# Patient Record
Sex: Female | Born: 1960
Health system: Southern US, Community
[De-identification: ages and names within clinical notes are randomized; demographics above are authoritative.]

## PROBLEM LIST (undated history)

## (undated) DIAGNOSIS — R600 Localized edema: Secondary | ICD-10-CM

## (undated) DIAGNOSIS — I10 Essential (primary) hypertension: Secondary | ICD-10-CM

## (undated) DIAGNOSIS — K760 Fatty (change of) liver, not elsewhere classified: Secondary | ICD-10-CM

## (undated) DIAGNOSIS — K219 Gastro-esophageal reflux disease without esophagitis: Secondary | ICD-10-CM

## (undated) DIAGNOSIS — M797 Fibromyalgia: Secondary | ICD-10-CM

## (undated) DIAGNOSIS — G8929 Other chronic pain: Secondary | ICD-10-CM

## (undated) DIAGNOSIS — R519 Headache, unspecified: Secondary | ICD-10-CM

## (undated) DIAGNOSIS — T7840XA Allergy, unspecified, initial encounter: Secondary | ICD-10-CM

## (undated) DIAGNOSIS — E119 Type 2 diabetes mellitus without complications: Secondary | ICD-10-CM

## (undated) DIAGNOSIS — J45909 Unspecified asthma, uncomplicated: Secondary | ICD-10-CM

## (undated) DIAGNOSIS — Z973 Presence of spectacles and contact lenses: Secondary | ICD-10-CM

## (undated) DIAGNOSIS — D219 Benign neoplasm of connective and other soft tissue, unspecified: Secondary | ICD-10-CM

## (undated) DIAGNOSIS — Z8711 Personal history of peptic ulcer disease: Secondary | ICD-10-CM

## (undated) DIAGNOSIS — M549 Dorsalgia, unspecified: Secondary | ICD-10-CM

## (undated) DIAGNOSIS — Z8719 Personal history of other diseases of the digestive system: Secondary | ICD-10-CM

## (undated) DIAGNOSIS — K589 Irritable bowel syndrome without diarrhea: Secondary | ICD-10-CM

## (undated) DIAGNOSIS — G4730 Sleep apnea, unspecified: Secondary | ICD-10-CM

## (undated) DIAGNOSIS — M1990 Unspecified osteoarthritis, unspecified site: Secondary | ICD-10-CM

## (undated) DIAGNOSIS — Z87442 Personal history of urinary calculi: Secondary | ICD-10-CM

## (undated) DIAGNOSIS — T8859XA Other complications of anesthesia, initial encounter: Secondary | ICD-10-CM

## (undated) DIAGNOSIS — D649 Anemia, unspecified: Secondary | ICD-10-CM

## (undated) DIAGNOSIS — M2550 Pain in unspecified joint: Secondary | ICD-10-CM

## (undated) HISTORY — PX: BREAST BIOPSY: SHX20

## (undated) HISTORY — PX: CHOLECYSTECTOMY: SHX55

## (undated) HISTORY — DX: Personal history of peptic ulcer disease: Z87.11

## (undated) HISTORY — DX: Allergy, unspecified, initial encounter: T78.40XA

## (undated) HISTORY — PX: UMBILICAL HERNIA REPAIR: SHX196

## (undated) HISTORY — DX: Essential (primary) hypertension: I10

## (undated) HISTORY — DX: Irritable bowel syndrome, unspecified: K58.9

## (undated) HISTORY — DX: Anemia, unspecified: D64.9

## (undated) HISTORY — PX: COLONOSCOPY: SHX174

## (undated) HISTORY — DX: Type 2 diabetes mellitus without complications: E11.9

## (undated) HISTORY — DX: Dorsalgia, unspecified: M54.9

## (undated) HISTORY — DX: Benign neoplasm of connective and other soft tissue, unspecified: D21.9

## (undated) HISTORY — PX: OVARIAN CYST SURGERY: SHX726

## (undated) HISTORY — PX: BREAST EXCISIONAL BIOPSY: SUR124

## (undated) HISTORY — DX: Localized edema: R60.0

## (undated) HISTORY — PX: UPPER GI ENDOSCOPY: SHX6162

## (undated) HISTORY — DX: Pain in unspecified joint: M25.50

## (undated) HISTORY — PX: DILATION AND CURETTAGE OF UTERUS: SHX78

---
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
## 1997-12-24 ENCOUNTER — Encounter: Admission: RE | Admit: 1997-12-24 | Discharge: 1998-03-24 | Payer: Self-pay | Admitting: Family Medicine

## 1998-01-04 ENCOUNTER — Ambulatory Visit: Admission: RE | Admit: 1998-01-04 | Discharge: 1998-01-04 | Payer: Self-pay | Admitting: Family Medicine

## 1998-01-10 ENCOUNTER — Encounter: Admission: RE | Admit: 1998-01-10 | Discharge: 1998-01-10 | Payer: Self-pay | Admitting: Infectious Diseases

## 1998-01-25 ENCOUNTER — Ambulatory Visit: Admission: RE | Admit: 1998-01-25 | Discharge: 1998-01-25 | Payer: Self-pay | Admitting: *Deleted

## 1998-02-03 ENCOUNTER — Ambulatory Visit: Admission: RE | Admit: 1998-02-03 | Discharge: 1998-02-03 | Payer: Self-pay | Admitting: *Deleted

## 1998-08-29 ENCOUNTER — Ambulatory Visit (HOSPITAL_COMMUNITY): Admission: RE | Admit: 1998-08-29 | Discharge: 1998-08-29 | Payer: Self-pay | Admitting: Family Medicine

## 1999-01-23 ENCOUNTER — Ambulatory Visit (HOSPITAL_COMMUNITY): Admission: RE | Admit: 1999-01-23 | Discharge: 1999-01-23 | Payer: Self-pay | Admitting: Gastroenterology

## 1999-02-06 ENCOUNTER — Ambulatory Visit (HOSPITAL_COMMUNITY): Admission: RE | Admit: 1999-02-06 | Discharge: 1999-02-06 | Payer: Self-pay | Admitting: Gastroenterology

## 1999-03-16 ENCOUNTER — Ambulatory Visit (HOSPITAL_COMMUNITY): Admission: RE | Admit: 1999-03-16 | Discharge: 1999-03-16 | Payer: Self-pay | Admitting: Family Medicine

## 1999-03-16 ENCOUNTER — Encounter: Payer: Self-pay | Admitting: Family Medicine

## 1999-05-08 ENCOUNTER — Encounter (INDEPENDENT_AMBULATORY_CARE_PROVIDER_SITE_OTHER): Payer: Self-pay | Admitting: Specialist

## 1999-05-08 ENCOUNTER — Ambulatory Visit (HOSPITAL_COMMUNITY): Admission: RE | Admit: 1999-05-08 | Discharge: 1999-05-08 | Payer: Self-pay | Admitting: Obstetrics and Gynecology

## 1999-05-11 ENCOUNTER — Inpatient Hospital Stay (HOSPITAL_COMMUNITY): Admission: AD | Admit: 1999-05-11 | Discharge: 1999-05-11 | Payer: Self-pay | Admitting: *Deleted

## 1999-05-12 ENCOUNTER — Encounter: Payer: Self-pay | Admitting: Obstetrics & Gynecology

## 1999-05-12 ENCOUNTER — Inpatient Hospital Stay (HOSPITAL_COMMUNITY): Admission: AD | Admit: 1999-05-12 | Discharge: 1999-05-12 | Payer: Self-pay | Admitting: Obstetrics & Gynecology

## 1999-05-18 ENCOUNTER — Encounter: Payer: Self-pay | Admitting: Family Medicine

## 1999-05-18 ENCOUNTER — Ambulatory Visit (HOSPITAL_COMMUNITY): Admission: RE | Admit: 1999-05-18 | Discharge: 1999-05-18 | Payer: Self-pay | Admitting: Family Medicine

## 1999-05-23 ENCOUNTER — Encounter: Payer: Self-pay | Admitting: Family Medicine

## 1999-05-23 ENCOUNTER — Ambulatory Visit (HOSPITAL_COMMUNITY): Admission: RE | Admit: 1999-05-23 | Discharge: 1999-05-23 | Payer: Self-pay | Admitting: Family Medicine

## 1999-09-04 ENCOUNTER — Encounter: Admission: RE | Admit: 1999-09-04 | Discharge: 1999-12-03 | Payer: Self-pay | Admitting: Family Medicine

## 2000-04-12 ENCOUNTER — Ambulatory Visit (HOSPITAL_COMMUNITY): Admission: RE | Admit: 2000-04-12 | Discharge: 2000-04-12 | Payer: Self-pay

## 2000-08-08 ENCOUNTER — Emergency Department (HOSPITAL_COMMUNITY): Admission: EM | Admit: 2000-08-08 | Discharge: 2000-08-08 | Payer: Self-pay | Admitting: *Deleted

## 2002-01-15 ENCOUNTER — Ambulatory Visit (HOSPITAL_COMMUNITY): Admission: RE | Admit: 2002-01-15 | Discharge: 2002-01-15 | Payer: Self-pay | Admitting: Family Medicine

## 2002-01-15 ENCOUNTER — Encounter: Payer: Self-pay | Admitting: Family Medicine

## 2002-10-28 ENCOUNTER — Emergency Department (HOSPITAL_COMMUNITY): Admission: EM | Admit: 2002-10-28 | Discharge: 2002-10-28 | Payer: Self-pay | Admitting: Emergency Medicine

## 2002-10-28 ENCOUNTER — Encounter: Payer: Self-pay | Admitting: Emergency Medicine

## 2002-11-03 ENCOUNTER — Ambulatory Visit (HOSPITAL_COMMUNITY): Admission: RE | Admit: 2002-11-03 | Discharge: 2002-11-03 | Payer: Self-pay | Admitting: Family Medicine

## 2002-11-03 ENCOUNTER — Encounter: Payer: Self-pay | Admitting: Family Medicine

## 2003-03-05 ENCOUNTER — Other Ambulatory Visit: Admission: RE | Admit: 2003-03-05 | Discharge: 2003-03-05 | Payer: Self-pay | Admitting: Family Medicine

## 2003-12-17 ENCOUNTER — Emergency Department (HOSPITAL_COMMUNITY): Admission: EM | Admit: 2003-12-17 | Discharge: 2003-12-17 | Payer: Self-pay

## 2004-01-25 ENCOUNTER — Ambulatory Visit (HOSPITAL_COMMUNITY): Admission: RE | Admit: 2004-01-25 | Discharge: 2004-01-25 | Payer: Self-pay | Admitting: Family Medicine

## 2004-03-09 ENCOUNTER — Other Ambulatory Visit: Admission: RE | Admit: 2004-03-09 | Discharge: 2004-03-09 | Payer: Self-pay | Admitting: Family Medicine

## 2005-03-12 ENCOUNTER — Other Ambulatory Visit: Admission: RE | Admit: 2005-03-12 | Discharge: 2005-03-12 | Payer: Self-pay | Admitting: Family Medicine

## 2005-09-03 ENCOUNTER — Encounter: Admission: RE | Admit: 2005-09-03 | Discharge: 2005-09-03 | Payer: Self-pay | Admitting: Family Medicine

## 2005-12-19 ENCOUNTER — Encounter: Payer: Self-pay | Admitting: Family Medicine

## 2008-08-20 HISTORY — PX: LUMBAR LAMINECTOMY: SHX95

## 2008-09-08 ENCOUNTER — Encounter: Admission: RE | Admit: 2008-09-08 | Discharge: 2008-09-08 | Payer: Self-pay | Admitting: Family Medicine

## 2008-10-08 ENCOUNTER — Inpatient Hospital Stay (HOSPITAL_COMMUNITY): Admission: EM | Admit: 2008-10-08 | Discharge: 2008-10-16 | Payer: Self-pay | Admitting: Emergency Medicine

## 2008-10-09 ENCOUNTER — Encounter: Payer: Self-pay | Admitting: Neurological Surgery

## 2008-12-14 ENCOUNTER — Encounter: Admission: RE | Admit: 2008-12-14 | Discharge: 2009-03-14 | Payer: Self-pay | Admitting: Neurological Surgery

## 2009-03-28 ENCOUNTER — Encounter: Admission: RE | Admit: 2009-03-28 | Discharge: 2009-06-26 | Payer: Self-pay | Admitting: Neurological Surgery

## 2009-08-25 ENCOUNTER — Encounter: Admission: RE | Admit: 2009-08-25 | Discharge: 2009-11-23 | Payer: Self-pay | Admitting: Neurological Surgery

## 2009-12-27 ENCOUNTER — Encounter: Admission: RE | Admit: 2009-12-27 | Discharge: 2009-12-27 | Payer: Self-pay | Admitting: Family Medicine

## 2010-01-02 ENCOUNTER — Encounter: Admission: RE | Admit: 2010-01-02 | Discharge: 2010-01-02 | Payer: Self-pay | Admitting: Family Medicine

## 2010-01-17 ENCOUNTER — Other Ambulatory Visit: Admission: RE | Admit: 2010-01-17 | Discharge: 2010-01-17 | Payer: Self-pay | Admitting: Family Medicine

## 2010-01-19 ENCOUNTER — Encounter: Admission: RE | Admit: 2010-01-19 | Discharge: 2010-01-19 | Payer: Self-pay | Admitting: Neurological Surgery

## 2010-06-07 ENCOUNTER — Encounter
Admission: RE | Admit: 2010-06-07 | Discharge: 2010-06-07 | Payer: Self-pay | Source: Home / Self Care | Attending: Neurological Surgery | Admitting: Neurological Surgery

## 2010-06-23 ENCOUNTER — Encounter: Admission: RE | Admit: 2010-06-23 | Discharge: 2010-06-23 | Payer: Self-pay | Admitting: Family Medicine

## 2010-12-05 LAB — URINALYSIS, ROUTINE W REFLEX MICROSCOPIC
Bilirubin Urine: NEGATIVE
Glucose, UA: NEGATIVE mg/dL

## 2010-12-05 LAB — CBC
HCT: 35.9 % — ABNORMAL LOW (ref 36.0–46.0)
HCT: 36.5 % (ref 36.0–46.0)
Hemoglobin: 11.9 g/dL — ABNORMAL LOW (ref 12.0–15.0)
Hemoglobin: 12.2 g/dL (ref 12.0–15.0)
MCHC: 33 g/dL (ref 30.0–36.0)
MCV: 84.6 fL (ref 78.0–100.0)
Platelets: 405 10*3/uL — ABNORMAL HIGH (ref 150–400)
RBC: 4.36 MIL/uL (ref 3.87–5.11)
RDW: 18 % — ABNORMAL HIGH (ref 11.5–15.5)
WBC: 9.1 10*3/uL (ref 4.0–10.5)

## 2010-12-05 LAB — DIFFERENTIAL
Basophils Absolute: 0 10*3/uL (ref 0.0–0.1)
Basophils Absolute: 0 10*3/uL (ref 0.0–0.1)
Basophils Relative: 0 % (ref 0–1)
Eosinophils Absolute: 0 10*3/uL (ref 0.0–0.7)
Eosinophils Absolute: 0.3 10*3/uL (ref 0.0–0.7)
Eosinophils Relative: 0 % (ref 0–5)
Lymphs Abs: 2.1 10*3/uL (ref 0.7–4.0)
Monocytes Absolute: 0.1 10*3/uL (ref 0.1–1.0)
Monocytes Relative: 2 % — ABNORMAL LOW (ref 3–12)
Neutro Abs: 8.5 10*3/uL — ABNORMAL HIGH (ref 1.7–7.7)
Neutrophils Relative %: 94 % — ABNORMAL HIGH (ref 43–77)

## 2010-12-05 LAB — COMPREHENSIVE METABOLIC PANEL
AST: 26 U/L (ref 0–37)
Albumin: 3.5 g/dL (ref 3.5–5.2)
CO2: 26 mEq/L (ref 19–32)
Calcium: 8.5 mg/dL (ref 8.4–10.5)
Chloride: 105 mEq/L (ref 96–112)
Glucose, Bld: 104 mg/dL — ABNORMAL HIGH (ref 70–99)
Total Bilirubin: 1.3 mg/dL — ABNORMAL HIGH (ref 0.3–1.2)
Total Protein: 6.4 g/dL (ref 6.0–8.3)

## 2010-12-05 LAB — URINE MICROSCOPIC-ADD ON

## 2010-12-05 LAB — POCT I-STAT, CHEM 8
BUN: 5 mg/dL — ABNORMAL LOW (ref 6–23)
Hemoglobin: 13.9 g/dL (ref 12.0–15.0)
Sodium: 142 mEq/L (ref 135–145)
TCO2: 23 mmol/L (ref 0–100)

## 2010-12-05 LAB — BASIC METABOLIC PANEL
CO2: 23 mEq/L (ref 19–32)
Calcium: 8.9 mg/dL (ref 8.4–10.5)
Chloride: 107 mEq/L (ref 96–112)
GFR calc Af Amer: >60 mL/min (ref >60)
GFR calc non Af Amer: >60 mL/min (ref >60)

## 2010-12-12 ENCOUNTER — Other Ambulatory Visit: Payer: Self-pay | Admitting: Family Medicine

## 2010-12-12 DIAGNOSIS — Z09 Encounter for follow-up examination after completed treatment for conditions other than malignant neoplasm: Secondary | ICD-10-CM

## 2010-12-29 ENCOUNTER — Ambulatory Visit
Admission: RE | Admit: 2010-12-29 | Discharge: 2010-12-29 | Disposition: A | Discharge status: Home / Self Care | Payer: PRIVATE HEALTH INSURANCE | Source: Ambulatory Visit | Attending: Family Medicine | Admitting: Family Medicine

## 2010-12-29 ENCOUNTER — Other Ambulatory Visit: Payer: Self-pay | Admitting: Family Medicine

## 2010-12-29 ENCOUNTER — Other Ambulatory Visit: Payer: Self-pay | Admitting: Diagnostic Radiology

## 2010-12-29 DIAGNOSIS — Z09 Encounter for follow-up examination after completed treatment for conditions other than malignant neoplasm: Secondary | ICD-10-CM

## 2011-01-02 NOTE — H&P (Signed)
[name], [name]             ACCOUNT NO.:  [id number]   MEDICAL RECORD NO.:  [id number]          PATIENT TYPE:  INP   LOCATION:  3006                         FACILITY:  [facility]   PHYSICIAN:  Tia Alert, MD     DATE OF BIRTH:  1960-08-27   DATE OF ADMISSION:  10/07/2008  DATE OF DISCHARGE:                              HISTORY & PHYSICAL   CHIEF COMPLAINT:  Back and leg pains.   HISTORY OF PRESENT ILLNESS:  [name] is a 50 year old female, who  was scheduled for a lumbar microdiskectomy at L4-S1 of the left in early  March; however, she presented to the emergency department with severe  unrelenting pain.  We had called in a Medrol Dosepak for her that did  not help the pain.  She felt like she could not get up and around and  even to the point that she urinated on herself.  She could not get up to  the bathroom.  She did not have incontinence.  She presented to the  emergency department where they put her on Dilaudid PCA, placed a Foley  catheter, but did not feel that she had any urinary retention.  She did  not complain of perineal numbness, but was complaining of severe  unrelenting pain despite multiple narcotics and Toradol in the emergency  department.  Therefore, she is admitted for lumbar microdiskectomy.   PAST MEDICAL HISTORY:  1. Cholecystectomy.  2. Fibroids.  3. Fibromyalgia.   MEDICATIONS:  Resume Seroquel, Percocet, and Zanaflex.   ALLERGIES:  PENICILLIN, ROCEPHIN, and PREDNISONE.   SOCIAL HISTORY:  Denies use of tobacco or alcohol products.   FAMILY HISTORY:  Positive for heart attack, diabetes, and stroke.   REVIEW OF SYSTEMS:  As above.   PHYSICAL EXAMINATION:  VITAL SIGNS:  She is 5 feet and 3, 270 pounds,  and respirations 20.  GENERAL:  Pleasant female, who is in obvious discomfort.  HEENT:  Normocephalic and atraumatic.  Extraocular movements are intact.  NECK:  Supple.  HEART:  Regular rate and rhythm.  EXTREMITIES:  No obvious  deformities.  NEUROLOGIC:  She has good strength in her lower extremities with good  muscle tone and good muscle bulk.  She does have a positive straight leg  raise test.  Sensation maybe a little decreased in the side of the foot.  Reflexes are fairly normal in the lower extremities though they are  hypoactive.  Gait is not tested at this time.   ASSESSMENT AND PLAN:  This is a 50 year old female with a large left-  sided disk herniation, who is scheduled for surgery.  We will make her  n.p.o. after midnight and plan on doing microdiskectomy on Saturday  morning, which is tomorrow morning.  She understands the risks and  benefits, and expected outcome of this procedure as discussed in the  office, and she wishes to proceed.      Tia Alert, MD  Electronically Signed     DSJ/MEDQ  D:  10/09/2008  T:  10/10/2008  Job:  [id number]

## 2011-01-02 NOTE — Op Note (Signed)
NAMELACRESIA, DARWISH             ACCOUNT NO.:  [id number]   MEDICAL RECORD NO.:  [id number]          PATIENT TYPE:  INP   LOCATION:  3006                         FACILITY:  [facility]   PHYSICIAN:  Tia Alert, MD     DATE OF BIRTH:  20-Oct-1960   DATE OF PROCEDURE:  10/09/2008  DATE OF DISCHARGE:                               OPERATIVE REPORT   PREOPERATIVE DIAGNOSIS:  Left L5-S1 herniated nucleus pulposus with back  and left leg pain.   POSTOPERATIVE DIAGNOSIS:  Left L5-S1 herniated nucleus pulposus with  back and left leg pain.   PROCEDURE:  Lumbar hemilaminectomy, medial facetectomy, and foraminotomy  at L5-S1 on the left followed by microdiskectomy at L5-S1 on the left  utilizing microscopic dissection.   SURGEON:  Tia Alert, MD   ASSISTANT:  None.   ANESTHESIA:  General endotracheal.   COMPLICATIONS:  None apparent.   INDICATIONS FOR PROCEDURE:  Ms. Binney is a 50 year old obese African  American female who presented to emergency department with severe back  and left leg pain.  She was scheduled for microdiskectomy at L5-S1 on  the left in early March, but had severe unrelenting pain that was  uncontrollable and therefore she was admitted for pain control and for a  microdiskectomy.  She understands the risks, benefits, and expected  outcome and wished to proceed.   DESCRIPTION OF PROCEDURE:  The patient was taken to the operating room  and after induction of adequate generalized endotracheal anesthesia, she  was rolled into the prone position on the Wilson frame.  All pressure  points were padded.  Her lumbar region was prepped with DuraPrep and  draped in the usual sterile fashion.  A 5 mL of local anesthesia was  injected and a dorsal midline incision was made and carried down to the  lumbosacral fascia.  The fascia was opened and the paraspinous  musculature was taken down in the subperiosteal fashion to expose L5-S1  on the left.  Intraoperative x-ray  confirmed my level and then I used  the Kerrison punch to perform a hemilaminectomy, medial facetectomy, and  foraminotomy at L5-S1 on the left side.  The underlying [place]  ligament was opened and removed in a piecemeal fashion to expose the  underlying dura and S1 nerve root.  I retracted the nerve root medially  and coagulated the epidural venous vasculature and pulled out a large  free fragment.  I then brought in the operating microscope and performed  a thorough intradiskal diskectomy with pituitary rongeurs.  Once I was  done, I palpated it with a coronary dilator into the midline and into  the foramen to assure adequate decompression.  I felt no more  compression of the nerve root and the nerve root was free.  I irrigated  it with saline solution containing bacitracin, dried all bleeding  points, removed the retractor, inspected it once again, lined the dura  with Duragen to prevent epidural fibrosis and closed the fascia with 0  Vicryl closing subcutaneous tissue and layers of 0 and 2-0 Vicryl in the  subcuticular tissue with 3-0 Vicryl  and skin with benzoin and Steri-  Strips.  The drapes were removed.  Sterile dressing was applied.  The  patient was awakened from general anesthesia and transferred to the  recovery in stable condition.  At the end of the procedure, all sponge,  needle, and instrument counts were correct.      Tia Alert, MD  Electronically Signed     DSJ/MEDQ  D:  10/09/2008  T:  10/10/2008  Job:  [id number]

## 2011-01-05 NOTE — Discharge Summary (Signed)
Virginia Luna, Virginia Luna             ACCOUNT NO.:  [id number]   MEDICAL RECORD NO.:  [id number]          PATIENT TYPE:  INP   LOCATION:  3006                         FACILITY:  [facility]   PHYSICIAN:  Tia Alert, MD     DATE OF BIRTH:  [birth year]   DATE OF ADMISSION:  10/07/2008  DATE OF DISCHARGE:  10/16/2008                               DISCHARGE SUMMARY   ADMITTING DIAGNOSIS:  Lumbar disk herniation, L5-S1.   PROCEDURE:  Microdiskectomy, L5-S1.   HISTORY OF PRESENT ILLNESS:  Virginia Luna is a 50 year old African  American female who presented to the emergency department with severe  back and left leg pain.  She had been scheduled for microdiskectomy, L5-  S1 on the left for early March, but had severe unrelenting pain that was  uncontrollable, and therefore, she was admitted for pain control and for  microdiskectomy.   HOSPITAL COURSE:  The patient was admitted and taken to the operating  room on October 09, 2008, where she underwent a left L5-S1  microdiskectomy.  The patient tolerated the procedure well, was taken to  recovery room following stable condition.  For details of the operative  procedure, please see the dictated operative note.   The patient was difficult to encourage to mobilize after surgery.  She  remained afebrile with stable vital signs, however, she had persistent  nausea and vomiting after the surgery requiring treatment with  Phenergan.  She was unable to tolerate regular diet for quite a long  time, because of that, we tried to change her medications as best we  could.  She continues to complain of back pain and leg pain, therefore,  we got an MRI.  This did not show any complicated feature or recurrent  or residual disk herniation.  She finally started to do well on October 16, 2008, where she was tolerating regular diet.  She was afebrile with  stable vital signs.  Her incision was clean, dry, and intact.  She had  good strength.  She was ambulating  well.  She was discharged home in  stable condition on October 16, 2008.  Plan is to follow up in 2 weeks.   FINAL DIAGNOSIS:  Microdiskectomy, L5-S1.      Tia Alert, MD  Electronically Signed     DSJ/MEDQ  D:  11/12/2008  T:  11/13/2008  Job:  [id number]

## 2013-02-06 ENCOUNTER — Other Ambulatory Visit (HOSPITAL_COMMUNITY): Payer: Self-pay

## 2013-02-06 DIAGNOSIS — J45909 Unspecified asthma, uncomplicated: Secondary | ICD-10-CM

## 2013-02-10 ENCOUNTER — Ambulatory Visit (HOSPITAL_COMMUNITY)
Admission: RE | Admit: 2013-02-10 | Discharge: 2013-02-10 | Disposition: A | Discharge status: Home / Self Care | Payer: Medicare Other | Source: Ambulatory Visit | Attending: Family Medicine | Admitting: Family Medicine

## 2013-02-10 DIAGNOSIS — J45909 Unspecified asthma, uncomplicated: Secondary | ICD-10-CM | POA: Insufficient documentation

## 2013-02-10 MED ORDER — ALBUTEROL SULFATE (5 MG/ML) 0.5% IN NEBU
2.5000 mg | INHALATION_SOLUTION | Freq: Once | RESPIRATORY_TRACT | Status: AC
  Administered 2013-02-10: 2.5 mg via RESPIRATORY_TRACT

## 2013-02-11 ENCOUNTER — Other Ambulatory Visit: Payer: Self-pay

## 2013-02-11 DIAGNOSIS — Z1231 Encounter for screening mammogram for malignant neoplasm of breast: Secondary | ICD-10-CM

## 2013-03-09 ENCOUNTER — Ambulatory Visit
Admission: RE | Admit: 2013-03-09 | Discharge: 2013-03-09 | Disposition: A | Discharge status: Home / Self Care | Payer: PRIVATE HEALTH INSURANCE | Source: Ambulatory Visit

## 2013-03-09 DIAGNOSIS — Z1231 Encounter for screening mammogram for malignant neoplasm of breast: Secondary | ICD-10-CM

## 2014-09-03 ENCOUNTER — Other Ambulatory Visit: Payer: Self-pay

## 2014-09-03 DIAGNOSIS — Z1231 Encounter for screening mammogram for malignant neoplasm of breast: Secondary | ICD-10-CM

## 2014-09-06 ENCOUNTER — Encounter (INDEPENDENT_AMBULATORY_CARE_PROVIDER_SITE_OTHER): Payer: Self-pay

## 2014-09-06 ENCOUNTER — Ambulatory Visit
Admission: RE | Admit: 2014-09-06 | Discharge: 2014-09-06 | Disposition: A | Discharge status: Home / Self Care | Payer: No Typology Code available for payment source | Source: Ambulatory Visit

## 2014-09-06 DIAGNOSIS — Z1231 Encounter for screening mammogram for malignant neoplasm of breast: Secondary | ICD-10-CM

## 2014-09-09 ENCOUNTER — Other Ambulatory Visit: Payer: Self-pay | Admitting: Family Medicine

## 2014-09-09 DIAGNOSIS — R928 Other abnormal and inconclusive findings on diagnostic imaging of breast: Secondary | ICD-10-CM

## 2014-09-16 ENCOUNTER — Ambulatory Visit
Admission: RE | Admit: 2014-09-16 | Discharge: 2014-09-16 | Disposition: A | Discharge status: Home / Self Care | Payer: Medicare Other | Source: Ambulatory Visit | Attending: Family Medicine | Admitting: Family Medicine

## 2014-09-16 ENCOUNTER — Other Ambulatory Visit: Payer: Self-pay | Admitting: Family Medicine

## 2014-09-16 DIAGNOSIS — N6310 Unspecified lump in the right breast, unspecified quadrant: Secondary | ICD-10-CM

## 2014-09-16 DIAGNOSIS — R928 Other abnormal and inconclusive findings on diagnostic imaging of breast: Secondary | ICD-10-CM

## 2014-09-16 DIAGNOSIS — N6320 Unspecified lump in the left breast, unspecified quadrant: Secondary | ICD-10-CM

## 2014-09-27 ENCOUNTER — Other Ambulatory Visit: Payer: Self-pay | Admitting: Family Medicine

## 2014-09-27 DIAGNOSIS — N6310 Unspecified lump in the right breast, unspecified quadrant: Secondary | ICD-10-CM

## 2014-09-30 ENCOUNTER — Other Ambulatory Visit: Payer: Self-pay | Admitting: Family Medicine

## 2014-09-30 ENCOUNTER — Ambulatory Visit
Admission: RE | Admit: 2014-09-30 | Discharge: 2014-09-30 | Disposition: A | Discharge status: Home / Self Care | Payer: Medicare Other | Source: Ambulatory Visit | Attending: Family Medicine | Admitting: Family Medicine

## 2014-09-30 DIAGNOSIS — N6320 Unspecified lump in the left breast, unspecified quadrant: Secondary | ICD-10-CM

## 2014-09-30 DIAGNOSIS — N6310 Unspecified lump in the right breast, unspecified quadrant: Secondary | ICD-10-CM

## 2014-10-18 ENCOUNTER — Ambulatory Visit (INDEPENDENT_AMBULATORY_CARE_PROVIDER_SITE_OTHER): Payer: Self-pay | Admitting: Surgery

## 2014-10-18 DIAGNOSIS — D241 Benign neoplasm of right breast: Secondary | ICD-10-CM

## 2014-10-18 DIAGNOSIS — D242 Benign neoplasm of left breast: Principal | ICD-10-CM

## 2014-10-18 NOTE — H&P (Signed)
Virginia Luna 10/18/2014 9:26 AM Location: [facility] Patient #: [id number] DOB: 1961/04/29 Single / Language: Virginia Luna / Race: Black or African American Female History of Present Illness [name] A. Dom [name] MD; 10/18/2014 10:05 AM) Patient words: right breast eval PT FOUND TO HAVE BILATERAL BREAST MASSES ON SCREENING MAMMOGRAM FOUND TO BE FIBROADENOMAWITH POSSIBLE BIPHASIC CHANGE ON THE RIGHT. SHE HAS FIBROCYSTIC BREAST DISEASE MOST OF HER LIFE SHE STATES AND CHRONIC PAIN FROM FIBROMYALGIA   DIAGNOSTIC BILATERAL MAMMOGRAM POST ULTRASOUND BIOPSY  COMPARISON: Previous exam(s).  FINDINGS: Mammographic images were obtained following ultrasound guided biopsy of right breast mass, 10 o'clock position and left breast mass, 230 o'clock.  Biopsy marking clips in appropriate position:  Right breast mass, 10 o'clock position: Ribbon shaped marking clip.  Left breast mass, 2:30 o'clock position: Wing shaped marking clip.  IMPRESSION: Biopsy marking clips in appropriate position:  Right breast mass, 10 o'clock position: Ribbon shaped marking clip.  Left breast mass, 2:30 o'clock position: Wing shaped marking clip.  Final Assessment: Post Procedure Mammograms for Marker Placement     Diagnosis 1. Breast, right, needle core biopsy, mass, 10 o'clock - FAVOR BIPHASIC EPITHELIAL / STROMAL LESION. - SEE COMMENT. 2. Breast, left, needle core biopsy, mass, 2:30 o'clock - FIBROADENOMA. - NO ATYPIA OR MALIGNANCY IDENTIFIED. - SEE COMMENT. Microscopic Comment 1. The differential of the 10 o'clock mass biopsy includes fibroadenoma, phyllodes, and a papillary lesion. A myofibroblastoma or a hamartomatous lesion are also possible (although the latter is not favored). Ultimately, definitive characterization is best performed on excision specimen. Dr. [name] has seen this case in consultation with agreement of the above diagnosis and comment. 2. Dr. [name] has seen the second  specimen in consultation with agreement.  The patient is a 54 year old female   Other Problems Virginia Luna, [place]; 10/18/2014 9:27 AM) Asthma Cholelithiasis Diabetes Mellitus Gastroesophageal Reflux Disease Hemorrhoids Sleep Apnea Umbilical Hernia Repair  Past Surgical History Virginia Luna, CMA; 10/18/2014 9:27 AM) Breast Biopsy Bilateral. Gallbladder Surgery - Laparoscopic Spinal Surgery - Lower Back  Diagnostic Studies History Virginia Luna, CMA; 10/18/2014 9:27 AM) Colonoscopy within last year Mammogram within last year Pap Smear 1-5 years ago  Allergies Virginia Luna, CMA; 10/18/2014 9:27 AM) No Known Drug Allergies 10/18/2014  Medication History ([name], CMA; 10/18/2014 9:28 AM) Hydrocodone-Acetaminophen (5-325MG  Tablet, Oral as needed) Active. Zolpidem Tartrate (10MG  Tablet, Oral) Active. Cyclobenzaprine HCl (10MG  Tablet, Oral) Active. MetFORMIN HCl ER (500MG  Tablet ER 24HR, Oral) Active. QUEtiapine Fumarate (300MG  Tablet, Oral) Active. Medications Reconciled  Social History Virginia Luna, CMA; 10/18/2014 9:27 AM) Caffeine use Carbonated beverages. No alcohol use No drug use Tobacco use Never smoker.  Family History Virginia Luna, [place]; 10/18/2014 9:27 AM) Arthritis Father. Diabetes Mellitus Father.  Pregnancy / Birth History Virginia Luna, [place]; 10/18/2014 9:27 AM) Age at menarche 72 years. Gravida 0 Para 0 Regular periods     Review of Systems ([place]; 10/18/2014 9:27 AM) General Present- Fatigue. Not Present- Appetite Loss, Chills, Fever, Night Sweats, Weight Gain and Weight Loss. Skin Not Present- Change in Wart/Mole, Dryness, Hives, Jaundice, New Lesions, Non-Healing Wounds, Rash and Ulcer. HEENT Present- Seasonal Allergies and Wears glasses/contact lenses. Not Present- Earache, Hearing Loss, Hoarseness, Nose Bleed, Oral Ulcers, Ringing in the Ears, Sinus Pain, Sore Throat, Visual Disturbances and Yellow Eyes. Respiratory  Present- Difficulty Breathing. Not Present- Bloody sputum, Chronic Cough, Snoring and Wheezing. Breast Present- Breast Mass. Not Present- Breast Pain, Nipple Discharge and Skin Changes. Cardiovascular Present- Swelling of Extremities. Not Present-  Chest Pain, Difficulty Breathing Lying Down, Leg Cramps, Palpitations, Rapid Heart Rate and Shortness of Breath. Gastrointestinal Present- Hemorrhoids and Vomiting. Not Present- Abdominal Pain, Bloating, Bloody Stool, Change in Bowel Habits, Chronic diarrhea, Constipation, Difficulty Swallowing, Excessive gas, Gets full quickly at meals, Indigestion, Nausea and Rectal Pain. Female Genitourinary Present- Frequency. Not Present- Nocturia, Painful Urination, Pelvic Pain and Urgency. Musculoskeletal Present- Joint Pain and Muscle Pain. Not Present- Back Pain, Joint Stiffness, Muscle Weakness and Swelling of Extremities. Neurological Not Present- Decreased Memory, Fainting, Headaches, Numbness, Seizures, Tingling, Tremor, Trouble walking and Weakness. Psychiatric Present- Change in Sleep Pattern. Not Present- Anxiety, Bipolar, Depression, Fearful and Frequent crying. Endocrine Present- New Diabetes. Not Present- Cold Intolerance, Excessive Hunger, Hair Changes, Heat Intolerance and Hot flashes. Hematology Not Present- Easy Bruising, Excessive bleeding, Gland problems, HIV and Persistent Infections.  Vitals ([name] CMA; 10/18/2014 9:27 AM) 10/18/2014 9:27 AM Weight: 263 lb Height: 62in Body Surface Area: 2.28 m Body Mass Index: 48.1 kg/m Temp.: 26F(Temporal)  Pulse: 81 (Regular)  BP: 136/94 (Sitting, Left Arm, Standard)     Physical Exam (Danijah Noh A. Chalene Treu MD; 10/18/2014 10:05 AM)  General Mental Status-Alert. General Appearance-Consistent with stated age. Hydration-Well hydrated. Voice-Normal.  Head and Neck Head-normocephalic, atraumatic with no lesions or palpable masses. Trachea-midline. Thyroid Gland  Characteristics - normal size and consistency.  Eye Eyeball - Bilateral-Extraocular movements intact. Sclera/Conjunctiva - Bilateral-No scleral icterus.  Chest and Lung Exam Chest and lung exam reveals -quiet, even and easy respiratory effort with no use of accessory muscles and on auscultation, normal breath sounds, no adventitious sounds and normal vocal resonance. Inspection Chest Wall - Normal. Back - normal.  Breast Breast - Left-Symmetric, Non Tender, No Biopsy scars, no Dimpling, No Inflammation, No Lumpectomy scars, No Mastectomy scars, No Peau d' Orange. Breast - Right-Symmetric, Non Tender, No Biopsy scars, no Dimpling, No Inflammation, No Lumpectomy scars, No Mastectomy scars, No Peau d' Orange. Breast Lump-No Palpable Breast Mass.  Cardiovascular Cardiovascular examination reveals -normal heart sounds, regular rate and rhythm with no murmurs and normal pedal pulses bilaterally.  Abdomen Inspection Inspection of the abdomen reveals - No Hernias. Skin - Scar - no surgical scars. Palpation/Percussion Palpation and Percussion of the abdomen reveal - Soft, Non Tender, No Rebound tenderness, No Rigidity (guarding) and No hepatosplenomegaly. Auscultation Auscultation of the abdomen reveals - Bowel sounds normal.  Neurologic Neurologic evaluation reveals -alert and oriented x 3 with no impairment of recent or remote memory. Mental Status-Normal.  Musculoskeletal Normal Exam - Left-Upper Extremity Strength Normal and Lower Extremity Strength Normal. Normal Exam - Right-Upper Extremity Strength Normal and Lower Extremity Strength Normal.  Lymphatic Head & Neck  General Head & Neck Lymphatics: Bilateral - Description - Normal. Axillary  General Axillary Region: Bilateral - Description - Normal. Tenderness - Non Tender. Femoral & Inguinal  Generalized Femoral & Inguinal Lymphatics: Bilateral - Description - Normal. Tenderness - Non  Tender.    Assessment & Plan (Linsy Ehresman A. Khi Mcmillen MD; 10/18/2014 9:46 AM)  FIBROADENOMA (217  D24.9)  LEFT BREAST MASS (611.72  N63) Impression: biphasic nature of pathology requires excision. Risk of lumpectomy include bleeding, infection, seroma, more surgery, use of seed/wire, wound care, cosmetic deformity and the need for other treatments, death , blood clots, death. Pt agrees to proceed.  BREAST MASS, RIGHT (611.72  N63) Impression: will remove not exclude biphasic lesion. Risk of lumpectomy include bleeding, infection, seroma, more surgery, use of seed/wire, wound care, cosmetic deformity and the need for other treatments, death , blood clots,  death. Pt agrees to proceed.  Current Plans Pt Education - CSS Breast Biopsy Instructions (FLB): discussed with patient and provided information.

## 2014-10-26 ENCOUNTER — Other Ambulatory Visit (INDEPENDENT_AMBULATORY_CARE_PROVIDER_SITE_OTHER): Payer: Self-pay | Admitting: Surgery

## 2014-10-26 DIAGNOSIS — D241 Benign neoplasm of right breast: Secondary | ICD-10-CM

## 2014-10-26 DIAGNOSIS — D242 Benign neoplasm of left breast: Principal | ICD-10-CM

## 2014-10-28 ENCOUNTER — Other Ambulatory Visit (INDEPENDENT_AMBULATORY_CARE_PROVIDER_SITE_OTHER): Payer: Self-pay | Admitting: Surgery

## 2014-10-28 DIAGNOSIS — D242 Benign neoplasm of left breast: Principal | ICD-10-CM

## 2014-10-28 DIAGNOSIS — D241 Benign neoplasm of right breast: Secondary | ICD-10-CM

## 2014-11-18 ENCOUNTER — Encounter (HOSPITAL_BASED_OUTPATIENT_CLINIC_OR_DEPARTMENT_OTHER): Payer: Self-pay | Admitting: *Deleted

## 2014-11-18 NOTE — Progress Notes (Signed)
Coming in for labs and ekg after seeds 4/5-states she does not need to use cpap anymore,denies any cardiac problems, asthma stable-will bring inhalers Chronic pain-fibromyalgia

## 2014-11-23 ENCOUNTER — Encounter (HOSPITAL_BASED_OUTPATIENT_CLINIC_OR_DEPARTMENT_OTHER)
Admission: RE | Admit: 2014-11-23 | Discharge: 2014-11-23 | Disposition: A | Discharge status: Home / Self Care | Payer: Medicare Other | Source: Ambulatory Visit | Attending: Surgery | Admitting: Surgery

## 2014-11-23 ENCOUNTER — Ambulatory Visit
Admission: RE | Admit: 2014-11-23 | Discharge: 2014-11-23 | Disposition: A | Discharge status: Home / Self Care | Payer: Medicare Other | Source: Ambulatory Visit | Attending: Surgery | Admitting: Surgery

## 2014-11-23 DIAGNOSIS — D242 Benign neoplasm of left breast: Secondary | ICD-10-CM | POA: Diagnosis not present

## 2014-11-23 DIAGNOSIS — M797 Fibromyalgia: Secondary | ICD-10-CM | POA: Diagnosis not present

## 2014-11-23 DIAGNOSIS — D241 Benign neoplasm of right breast: Secondary | ICD-10-CM

## 2014-11-23 DIAGNOSIS — N649 Disorder of breast, unspecified: Secondary | ICD-10-CM | POA: Diagnosis present

## 2014-11-23 DIAGNOSIS — Z9049 Acquired absence of other specified parts of digestive tract: Secondary | ICD-10-CM | POA: Diagnosis not present

## 2014-11-23 DIAGNOSIS — N6012 Diffuse cystic mastopathy of left breast: Secondary | ICD-10-CM | POA: Diagnosis not present

## 2014-11-23 DIAGNOSIS — Z6841 Body Mass Index (BMI) 40.0 and over, adult: Secondary | ICD-10-CM | POA: Diagnosis not present

## 2014-11-23 DIAGNOSIS — J45909 Unspecified asthma, uncomplicated: Secondary | ICD-10-CM | POA: Diagnosis not present

## 2014-11-23 DIAGNOSIS — E119 Type 2 diabetes mellitus without complications: Secondary | ICD-10-CM | POA: Diagnosis not present

## 2014-11-23 DIAGNOSIS — G4730 Sleep apnea, unspecified: Secondary | ICD-10-CM | POA: Diagnosis not present

## 2014-11-23 DIAGNOSIS — M1990 Unspecified osteoarthritis, unspecified site: Secondary | ICD-10-CM | POA: Diagnosis not present

## 2014-11-23 DIAGNOSIS — K649 Unspecified hemorrhoids: Secondary | ICD-10-CM | POA: Diagnosis not present

## 2014-11-23 DIAGNOSIS — N6011 Diffuse cystic mastopathy of right breast: Secondary | ICD-10-CM | POA: Diagnosis not present

## 2014-11-23 DIAGNOSIS — K219 Gastro-esophageal reflux disease without esophagitis: Secondary | ICD-10-CM | POA: Diagnosis not present

## 2014-11-23 NOTE — Progress Notes (Signed)
Pt extremely hard draw,  Attempted times 3 no success  Dr Tamala Ser notied he attempted times one ,  Dr Brantley Stage notified and labs cancelled.   Pt was made aware she may have iv in neck day of surgery if no success with iv start, pt agrees and understands

## 2014-11-24 ENCOUNTER — Ambulatory Visit (HOSPITAL_BASED_OUTPATIENT_CLINIC_OR_DEPARTMENT_OTHER): Payer: Medicare Other | Admitting: Certified Registered"

## 2014-11-24 ENCOUNTER — Ambulatory Visit
Admission: RE | Admit: 2014-11-24 | Discharge: 2014-11-24 | Disposition: A | Discharge status: Home / Self Care | Payer: Medicare Other | Source: Ambulatory Visit | Attending: Surgery | Admitting: Surgery

## 2014-11-24 ENCOUNTER — Ambulatory Visit (HOSPITAL_BASED_OUTPATIENT_CLINIC_OR_DEPARTMENT_OTHER)
Admission: RE | Admit: 2014-11-24 | Discharge: 2014-11-24 | Disposition: A | Discharge status: Home / Self Care | Payer: Medicare Other | Source: Ambulatory Visit | Attending: Surgery | Admitting: Surgery

## 2014-11-24 ENCOUNTER — Encounter (HOSPITAL_BASED_OUTPATIENT_CLINIC_OR_DEPARTMENT_OTHER): Payer: Self-pay | Admitting: Certified Registered"

## 2014-11-24 ENCOUNTER — Encounter (HOSPITAL_BASED_OUTPATIENT_CLINIC_OR_DEPARTMENT_OTHER)
Admission: RE | Disposition: A | Discharge status: Home / Self Care | Payer: Self-pay | Source: Ambulatory Visit | Attending: Surgery

## 2014-11-24 DIAGNOSIS — N6011 Diffuse cystic mastopathy of right breast: Secondary | ICD-10-CM | POA: Diagnosis not present

## 2014-11-24 DIAGNOSIS — D242 Benign neoplasm of left breast: Principal | ICD-10-CM

## 2014-11-24 DIAGNOSIS — D241 Benign neoplasm of right breast: Secondary | ICD-10-CM

## 2014-11-24 DIAGNOSIS — M797 Fibromyalgia: Secondary | ICD-10-CM | POA: Diagnosis not present

## 2014-11-24 DIAGNOSIS — E119 Type 2 diabetes mellitus without complications: Secondary | ICD-10-CM | POA: Insufficient documentation

## 2014-11-24 DIAGNOSIS — Z6841 Body Mass Index (BMI) 40.0 and over, adult: Secondary | ICD-10-CM | POA: Insufficient documentation

## 2014-11-24 DIAGNOSIS — N6012 Diffuse cystic mastopathy of left breast: Secondary | ICD-10-CM | POA: Insufficient documentation

## 2014-11-24 DIAGNOSIS — Z9049 Acquired absence of other specified parts of digestive tract: Secondary | ICD-10-CM | POA: Insufficient documentation

## 2014-11-24 DIAGNOSIS — J45909 Unspecified asthma, uncomplicated: Secondary | ICD-10-CM | POA: Insufficient documentation

## 2014-11-24 DIAGNOSIS — K649 Unspecified hemorrhoids: Secondary | ICD-10-CM | POA: Insufficient documentation

## 2014-11-24 DIAGNOSIS — M1990 Unspecified osteoarthritis, unspecified site: Secondary | ICD-10-CM | POA: Insufficient documentation

## 2014-11-24 DIAGNOSIS — G4730 Sleep apnea, unspecified: Secondary | ICD-10-CM | POA: Insufficient documentation

## 2014-11-24 DIAGNOSIS — K219 Gastro-esophageal reflux disease without esophagitis: Secondary | ICD-10-CM | POA: Insufficient documentation

## 2014-11-24 HISTORY — DX: Unspecified asthma, uncomplicated: J45.909

## 2014-11-24 HISTORY — DX: Unspecified osteoarthritis, unspecified site: M19.90

## 2014-11-24 HISTORY — DX: Type 2 diabetes mellitus without complications: E11.9

## 2014-11-24 HISTORY — DX: Fibromyalgia: M79.7

## 2014-11-24 HISTORY — DX: Presence of spectacles and contact lenses: Z97.3

## 2014-11-24 HISTORY — DX: Other chronic pain: G89.29

## 2014-11-24 HISTORY — DX: Sleep apnea, unspecified: G47.30

## 2014-11-24 HISTORY — PX: BREAST LUMPECTOMY WITH RADIOACTIVE SEED LOCALIZATION: SHX6424

## 2014-11-24 LAB — GLUCOSE, CAPILLARY
GLUCOSE-CAPILLARY: 133 mg/dL — AB (ref 70–99)
Glucose-Capillary: 95 mg/dL (ref 70–99)

## 2014-11-24 SURGERY — BREAST LUMPECTOMY WITH RADIOACTIVE SEED LOCALIZATION
Anesthesia: General | Site: Breast | Laterality: Bilateral

## 2014-11-24 MED ORDER — TRAMADOL HCL 50 MG PO TABS: 50.0000 mg | ORAL_TABLET | Freq: Four times a day (QID) | ORAL | Status: DC | PRN

## 2014-11-24 MED ORDER — FENTANYL CITRATE 0.05 MG/ML IJ SOLN
INTRAMUSCULAR | Status: DC | PRN
  Administered 2014-11-24: 50 ug via INTRAVENOUS
  Administered 2014-11-24: 100 ug via INTRAVENOUS

## 2014-11-24 MED ORDER — PROPOFOL 10 MG/ML IV BOLUS
INTRAVENOUS | Status: DC | PRN
  Administered 2014-11-24: 150 mg via INTRAVENOUS
  Administered 2014-11-24: 100 mg via INTRAVENOUS

## 2014-11-24 MED ORDER — LACTATED RINGERS IV SOLN
INTRAVENOUS | Status: DC | PRN
  Administered 2014-11-24: 11:00:00 via INTRAVENOUS

## 2014-11-24 MED ORDER — MIDAZOLAM HCL 5 MG/5ML IJ SOLN
INTRAMUSCULAR | Status: DC | PRN
  Administered 2014-11-24: 2 mg via INTRAVENOUS

## 2014-11-24 MED ORDER — HYDROMORPHONE HCL 1 MG/ML IJ SOLN
INTRAMUSCULAR | Status: AC
  Filled 2014-11-24: qty 1

## 2014-11-24 MED ORDER — LACTATED RINGERS IV SOLN: INTRAVENOUS | Status: DC

## 2014-11-24 MED ORDER — LIDOCAINE HCL (CARDIAC) 20 MG/ML IV SOLN
INTRAVENOUS | Status: DC | PRN
  Administered 2014-11-24: 60 mg via INTRAVENOUS

## 2014-11-24 MED ORDER — ACETAMINOPHEN 10 MG/ML IV SOLN
INTRAVENOUS | Status: AC
  Filled 2014-11-24: qty 100

## 2014-11-24 MED ORDER — MIDAZOLAM HCL 2 MG/2ML IJ SOLN
INTRAMUSCULAR | Status: AC
  Filled 2014-11-24: qty 2

## 2014-11-24 MED ORDER — DEXAMETHASONE SODIUM PHOSPHATE 4 MG/ML IJ SOLN
INTRAMUSCULAR | Status: DC | PRN
  Administered 2014-11-24: 4 mg via INTRAVENOUS

## 2014-11-24 MED ORDER — ACETAMINOPHEN 10 MG/ML IV SOLN
1000.0000 mg | Freq: Once | INTRAVENOUS | Status: AC
  Administered 2014-11-24: 1000 mg via INTRAVENOUS

## 2014-11-24 MED ORDER — MIDAZOLAM HCL 2 MG/2ML IJ SOLN: 1.0000 mg | INTRAMUSCULAR | Status: DC | PRN

## 2014-11-24 MED ORDER — ONDANSETRON HCL 4 MG/2ML IJ SOLN
INTRAMUSCULAR | Status: DC | PRN
  Administered 2014-11-24: 4 mg via INTRAVENOUS

## 2014-11-24 MED ORDER — DEXTROSE 5 % IV SOLN
3.0000 g | INTRAVENOUS | Status: AC
  Administered 2014-11-24: 3 g via INTRAVENOUS

## 2014-11-24 MED ORDER — BUPIVACAINE-EPINEPHRINE (PF) 0.25% -1:200000 IJ SOLN
INTRAMUSCULAR | Status: DC | PRN
  Administered 2014-11-24: 30 mL via PERINEURAL

## 2014-11-24 MED ORDER — PROMETHAZINE HCL 25 MG/ML IJ SOLN: 6.2500 mg | INTRAMUSCULAR | Status: DC | PRN

## 2014-11-24 MED ORDER — OXYCODONE-ACETAMINOPHEN 5-325 MG PO TABS: 1.0000 | ORAL_TABLET | ORAL | Status: DC | PRN

## 2014-11-24 MED ORDER — HYDROMORPHONE HCL 1 MG/ML IJ SOLN
0.2500 mg | INTRAMUSCULAR | Status: DC | PRN
  Administered 2014-11-24 (×4): 0.5 mg via INTRAVENOUS

## 2014-11-24 MED ORDER — EPHEDRINE SULFATE 50 MG/ML IJ SOLN
INTRAMUSCULAR | Status: DC | PRN
  Administered 2014-11-24 (×4): 10 mg via INTRAVENOUS

## 2014-11-24 MED ORDER — PROMETHAZINE HCL 12.5 MG PO TABS: 12.5000 mg | ORAL_TABLET | Freq: Four times a day (QID) | ORAL | Status: DC | PRN

## 2014-11-24 MED ORDER — CEFAZOLIN SODIUM-DEXTROSE 2-3 GM-% IV SOLR
INTRAVENOUS | Status: AC
  Filled 2014-11-24: qty 50

## 2014-11-24 MED ORDER — FENTANYL CITRATE 0.05 MG/ML IJ SOLN: 50.0000 ug | INTRAMUSCULAR | Status: DC | PRN

## 2014-11-24 MED ORDER — FENTANYL CITRATE 0.05 MG/ML IJ SOLN
INTRAMUSCULAR | Status: AC
  Filled 2014-11-24: qty 6

## 2014-11-24 MED ORDER — CEFAZOLIN SODIUM 1-5 GM-% IV SOLN
INTRAVENOUS | Status: AC
  Filled 2014-11-24: qty 50

## 2014-11-24 SURGICAL SUPPLY — 51 items
APPLIER CLIP 9.375 MED OPEN (MISCELLANEOUS)
APR CLP MED 9.3 20 MLT OPN (MISCELLANEOUS)
BINDER BREAST LRG (GAUZE/BANDAGES/DRESSINGS) IMPLANT
BINDER BREAST MEDIUM (GAUZE/BANDAGES/DRESSINGS) IMPLANT
BINDER BREAST XLRG (GAUZE/BANDAGES/DRESSINGS) IMPLANT
BINDER BREAST XXLRG (GAUZE/BANDAGES/DRESSINGS) IMPLANT
BLADE SURG 15 STRL LF DISP TIS (BLADE) ×1 IMPLANT
BLADE SURG 15 STRL SS (BLADE) ×2
CANISTER SUC SOCK COL 7IN (MISCELLANEOUS) ×2 IMPLANT
CANISTER SUCT 1200ML W/VALVE (MISCELLANEOUS) IMPLANT
CHLORAPREP W/TINT 26ML (MISCELLANEOUS) ×3 IMPLANT
CLIP APPLIE 9.375 MED OPEN (MISCELLANEOUS) IMPLANT
CLIP TI WIDE RED SMALL 6 (CLIP) ×2 IMPLANT
COVER BACK TABLE 60X90IN (DRAPES) ×2 IMPLANT
COVER MAYO STAND STRL (DRAPES) ×2 IMPLANT
COVER PROBE W GEL 5X96 (DRAPES) ×2 IMPLANT
DECANTER SPIKE VIAL GLASS SM (MISCELLANEOUS) IMPLANT
DEVICE DUBIN W/COMP PLATE 8390 (MISCELLANEOUS) ×3 IMPLANT
DRAPE LAPAROSCOPIC ABDOMINAL (DRAPES) IMPLANT
DRAPE LAPAROTOMY 100X72 PEDS (DRAPES) ×1 IMPLANT
DRAPE UTILITY XL STRL (DRAPES) ×2 IMPLANT
ELECT COATED BLADE 2.86 ST (ELECTRODE) ×2 IMPLANT
ELECT REM PT RETURN 9FT ADLT (ELECTROSURGICAL) ×2
ELECTRODE REM PT RTRN 9FT ADLT (ELECTROSURGICAL) ×1 IMPLANT
GLOVE BIOGEL PI IND STRL 7.5 (GLOVE) IMPLANT
GLOVE BIOGEL PI IND STRL 8 (GLOVE) ×1 IMPLANT
GLOVE BIOGEL PI INDICATOR 7.5 (GLOVE) ×2
GLOVE BIOGEL PI INDICATOR 8 (GLOVE) ×1
GLOVE ECLIPSE 8.0 STRL XLNG CF (GLOVE) ×2 IMPLANT
GLOVE SS BIOGEL STRL SZ 7.5 (GLOVE) IMPLANT
GLOVE SUPERSENSE BIOGEL SZ 7.5 (GLOVE) ×1
GOWN STRL REUS W/ TWL LRG LVL3 (GOWN DISPOSABLE) ×2 IMPLANT
GOWN STRL REUS W/TWL LRG LVL3 (GOWN DISPOSABLE) ×4
HEMOSTAT SNOW SURGICEL 2X4 (HEMOSTASIS) IMPLANT
KIT MARKER MARGIN INK (KITS) ×2 IMPLANT
LIQUID BAND (GAUZE/BANDAGES/DRESSINGS) ×2 IMPLANT
NDL HYPO 25X1 1.5 SAFETY (NEEDLE) ×1 IMPLANT
NEEDLE HYPO 25X1 1.5 SAFETY (NEEDLE) ×2 IMPLANT
NS IRRIG 1000ML POUR BTL (IV SOLUTION) ×1 IMPLANT
PACK BASIN DAY SURGERY FS (CUSTOM PROCEDURE TRAY) ×2 IMPLANT
PENCIL BUTTON HOLSTER BLD 10FT (ELECTRODE) ×2 IMPLANT
SLEEVE SCD COMPRESS KNEE MED (MISCELLANEOUS) ×2 IMPLANT
SPONGE LAP 4X18 X RAY DECT (DISPOSABLE) ×3 IMPLANT
SUT MNCRL AB 4-0 PS2 18 (SUTURE) ×2 IMPLANT
SUT SILK 2 0 SH (SUTURE) IMPLANT
SUT VICRYL 3-0 CR8 SH (SUTURE) ×2 IMPLANT
SYR CONTROL 10ML LL (SYRINGE) ×2 IMPLANT
TOWEL OR 17X24 6PK STRL BLUE (TOWEL DISPOSABLE) ×2 IMPLANT
TOWEL OR NON WOVEN STRL DISP B (DISPOSABLE) ×2 IMPLANT
TUBE CONNECTING 20X1/4 (TUBING) IMPLANT
YANKAUER SUCT BULB TIP NO VENT (SUCTIONS) IMPLANT

## 2014-11-24 NOTE — Transfer of Care (Signed)
Immediate Anesthesia Transfer of Care Note  Patient: Virginia Luna  Procedure(s) Performed: Procedure(s): BILATERAL BREAST LUMPECTOMY WITH RADIOACTIVE SEED LOCALIZATION (Bilateral)  Patient Location: PACU  Anesthesia Type:General  Level of Consciousness: awake, alert , oriented and patient cooperative  Airway & Oxygen Therapy: Patient Spontanous Breathing and Patient connected to face mask oxygen  Post-op Assessment: Report given to RN and Post -op Vital signs reviewed and stable  Post vital signs: Reviewed and stable  Last Vitals:  Filed Vitals:   11/24/14 1206  BP:   Pulse: 108  Temp:   Resp: 11    Complications: No apparent anesthesia complications

## 2014-11-24 NOTE — Op Note (Signed)
Preoperative diagnosis: Bilateral breast biphasic lesions  Postoperative diagnosis: Same  Procedure: Bilateral breast seed localized partial mastectomy  Surgeon: [name] M.D.  Anesthesia: Gen. endotracheal anesthesia with 0.25% Sensorcaine local with epinephrine  EBL: Minimal  IV fluids 400 mL crystalloid  Drains none  Specimen: Bilateral breast masses with clips and chips intact verified by x-ray  Indications for procedure: Patient presents for bilateral partial mastectomy due to biphasic lesions detected on mammography and core biopsy. Recommendations were to excise these areas to exclude phylloides tumor of breast. Risks, benefits and alternative therapies discussed.The procedure has been discussed with the patient. Alternatives to surgery have been discussed with the patient.  Risks of surgery include bleeding,  Infection,  Seroma formation, death,  and the need for further surgery.   The patient understands and wishes to proceed.  Description of procedure: Patient met in holding area and neoprobe used to localize Both  seeds. Patient didn't taken back to the operating room and placed supine on the OR table. Breast for prepped and draped in a sterile fashion and timeout was done. She was given preoperative antibiotics. Neoprobe used left side done first. See was localized with neoprobe in left upper outer breast in transverse incision made. Dissection carried down until all tissue around the seed was removed. Radiograph revealed seeding clip to be in specimen. Wound made hemostatic with cautery and closed with 3-0 Vicryl and 4-0 Monocryl.  The right breast was done in similar fashion. Neoprobe was used to identify hotspot in right lateral central breast. Local anesthesia infiltrated in transverse incision made. All tissue around chip and clip excised. Radiograph revealed the clip to be in place and a second clip from previous biopsy as well the specimen with the chip. Hemostasis  achieved wound closed with 3-0 Vicryl and 4-0 Monocryl. Liquid adhesive applied. Gross margins were negative in both specimens. All final counts sponge, needle and sponge found to be correct this portion the case. Patient awoke, extubated taken to recovery in satisfactory condition.

## 2014-11-24 NOTE — Interval H&P Note (Signed)
History and Physical Interval Note:  11/24/2014 10:06 AM  Virginia Luna  has presented today for surgery, with the diagnosis of Lesional Fibroadenoma  The various methods of treatment have been discussed with the patient and family. After consideration of risks, benefits and other options for treatment, the patient has consented to  Procedure(s): BILATERAL BREAST LUMPECTOMY WITH RADIOACTIVE SEED LOCALIZATION (Bilateral) as a surgical intervention .  The patient's history has been reviewed, patient examined, no change in status, stable for surgery.  I have reviewed the patient's chart and labs.  Questions were answered to the patient's satisfaction.     Lalanya Rufener A.

## 2014-11-24 NOTE — Anesthesia Postprocedure Evaluation (Signed)
  Anesthesia Post-op Note  Patient: Virginia Luna  Procedure(s) Performed: Procedure(s): BILATERAL BREAST LUMPECTOMY WITH RADIOACTIVE SEED LOCALIZATION (Bilateral)  Patient Location: PACU  Anesthesia Type:General  Level of Consciousness: awake and alert   Airway and Oxygen Therapy: Patient Spontanous Breathing  Post-op Pain: mild  Post-op Assessment: Post-op Vital signs reviewed  Post-op Vital Signs: stable  Last Vitals:  Filed Vitals:   11/24/14 1343  BP: 151/80  Pulse: 101  Temp: 36.7 C  Resp: 20    Complications: No apparent anesthesia complications

## 2014-11-24 NOTE — Anesthesia Preprocedure Evaluation (Addendum)
Anesthesia Evaluation  Patient identified by MRN, date of birth, ID band Patient awake    Reviewed: Allergy & Precautions, NPO status , Patient's Chart, lab work & pertinent test results  Airway Mallampati: I       Dental  (+) Teeth Intact   Pulmonary asthma , sleep apnea ,  breath sounds clear to auscultation        Cardiovascular negative cardio ROS  Rhythm:Regular Rate:Normal     Neuro/Psych  Neuromuscular disease    GI/Hepatic   Endo/Other  diabetes, Type 2, Oral Hypoglycemic AgentsMorbid obesity  Renal/GU      Musculoskeletal  (+) Arthritis -,   Abdominal (+) + obese,   Peds  Hematology   Anesthesia Other Findings   Reproductive/Obstetrics                            Anesthesia Physical Anesthesia Plan  ASA: III  Anesthesia Plan: General   Post-op Pain Management:    Induction: Intravenous  Airway Management Planned: Oral ETT  Additional Equipment:   Intra-op Plan:   Post-operative Plan: Extubation in OR  Informed Consent: I have reviewed the patients History and Physical, chart, labs and discussed the procedure including the risks, benefits and alternatives for the proposed anesthesia with the patient or authorized representative who has indicated his/her understanding and acceptance.   Dental advisory given  Plan Discussed with: CRNA and Surgeon  Anesthesia Plan Comments:         Anesthesia Quick Evaluation

## 2014-11-24 NOTE — Discharge Instructions (Signed)
[facility] °Office Phone Number [phone] ° °BREAST BIOPSY/ PARTIAL MASTECTOMY: POST OP INSTRUCTIONS ° °Always review your discharge instruction sheet given to you by the facility where your surgery was performed. ° °IF YOU HAVE DISABILITY OR FAMILY LEAVE FORMS, YOU MUST BRING THEM TO THE OFFICE FOR PROCESSING.  DO NOT GIVE THEM TO YOUR DOCTOR. ° °1. A prescription for pain medication may be given to you upon discharge.  Take your pain medication as prescribed, if needed.  If narcotic pain medicine is not needed, then you may take acetaminophen (Tylenol) or ibuprofen (Advil) as needed. °2. Take your usually prescribed medications unless otherwise directed °3. If you need a refill on your pain medication, please contact your pharmacy.  They will contact our office to request authorization.  Prescriptions will not be filled after 5pm or on week-ends. °4. You should eat very light the first 24 hours after surgery, such as soup, crackers, pudding, etc.  Resume your normal diet the day after surgery. °5. Most patients will experience some swelling and bruising in the breast.  Ice packs and a good support bra will help.  Swelling and bruising can take several days to resolve.  °6. It is common to experience some constipation if taking pain medication after surgery.  Increasing fluid intake and taking a stool softener will usually help or prevent this problem from occurring.  A mild laxative (Milk of Magnesia or Miralax) should be taken according to package directions if there are no bowel movements after 48 hours. °7. Unless discharge instructions indicate otherwise, you may remove your bandages 24-48 hours after surgery, and you may shower at that time.  You may have steri-strips (small skin tapes) in place directly over the incision.  These strips should be left on the skin for 7-10 days.  If your surgeon used skin glue on the incision, you may shower in 24 hours.  The glue will flake off over the  next 2-3 weeks.  Any sutures or staples will be removed at the office during your follow-up visit. °8. ACTIVITIES:  You may resume regular daily activities (gradually increasing) beginning the next day.  Wearing a good support bra or sports bra minimizes pain and swelling.  You may have sexual intercourse when it is comfortable. °a. You may drive when you no longer are taking prescription pain medication, you can comfortably wear a seatbelt, and you can safely maneuver your car and apply brakes. °b. RETURN TO WORK:  ______________________________________________________________________________________ °9. You should see your doctor in the office for a follow-up appointment approximately two weeks after your surgery.  Your doctor’s nurse will typically make your follow-up appointment when she calls you with your pathology report.  Expect your pathology report 2-3 business days after your surgery.  You may call to check if you do not hear from us after three days. °10. OTHER INSTRUCTIONS: _______________________________________________________________________________________________ _____________________________________________________________________________________________________________________________________ °_____________________________________________________________________________________________________________________________________ °_____________________________________________________________________________________________________________________________________ ° °WHEN TO CALL YOUR DOCTOR: °1. Fever over 101.0 °2. Nausea and/or vomiting. °3. Extreme swelling or bruising. °4. Continued bleeding from incision. °5. Increased pain, redness, or drainage from the incision. ° °The clinic staff is available to answer your questions during regular business hours.  Please don’t hesitate to call and ask to speak to one of the nurses for clinical concerns.  If you have a medical emergency, go to the nearest  emergency room or call 911.  A surgeon from [facility] is always on call at the hospital. ° °For further questions, please visit [email or web address]  ° ° ° °  Post Anesthesia Home Care Instructions ° °Activity: °Get plenty of rest for the remainder of the day. A responsible adult should stay with you for 24 hours following the procedure.  °For the next 24 hours, DO NOT: °-Drive a car °-Operate machinery °-Drink alcoholic beverages °-Take any medication unless instructed by your physician °-Make any legal decisions or sign important papers. ° °Meals: °Start with liquid foods such as gelatin or soup. Progress to regular foods as tolerated. Avoid greasy, spicy, heavy foods. If nausea and/or vomiting occur, drink only clear liquids until the nausea and/or vomiting subsides. Call your physician if vomiting continues. ° °Special Instructions/Symptoms: °Your throat may feel dry or sore from the anesthesia or the breathing tube placed in your throat during surgery. If this causes discomfort, gargle with warm salt water. The discomfort should disappear within 24 hours. ° °If you had a scopolamine patch placed behind your ear for the management of post- operative nausea and/or vomiting: ° °1. The medication in the patch is effective for 72 hours, after which it should be removed.  Wrap patch in a tissue and discard in the trash. Wash hands thoroughly with soap and water. °2. You may remove the patch earlier than 72 hours if you experience unpleasant side effects which may include dry mouth, dizziness or visual disturbances. °3. Avoid touching the patch. Wash your hands with soap and water after contact with the patch. °  ° °

## 2014-11-24 NOTE — H&P (Signed)
H&P   YESSENIA MAILLET (MR# [id number])      H&P Info    Author Note Status Last Update User Last Update Date/Time   [name], MD Signed [name], MD 10/18/2014 10:05 AM    H&P    Expand All Collapse All   Saharah Sherrow 10/18/2014 9:26 AM Location: [facility] Patient #: [id number] DOB: 22-Dec-1960 Single / Language: [name] / Race: Black or African American Female History of Present Illness [name] A. Halston Kintz MD; 10/18/2014 10:05 AM) Patient words: right breast eval PT FOUND TO HAVE BILATERAL BREAST MASSES ON SCREENING MAMMOGRAM FOUND TO BE FIBROADENOMAWITH POSSIBLE BIPHASIC CHANGE ON THE RIGHT. SHE HAS FIBROCYSTIC BREAST DISEASE MOST OF HER LIFE SHE STATES AND CHRONIC PAIN FROM FIBROMYALGIA   DIAGNOSTIC BILATERAL MAMMOGRAM POST ULTRASOUND BIOPSY  COMPARISON: Previous exam(s).  FINDINGS: Mammographic images were obtained following ultrasound guided biopsy of right breast mass, 10 o'clock position and left breast mass, 230 o'clock.  Biopsy marking clips in appropriate position:  Right breast mass, 10 o'clock position: Ribbon shaped marking clip.  Left breast mass, 2:30 o'clock position: Wing shaped marking clip.  IMPRESSION: Biopsy marking clips in appropriate position:  Right breast mass, 10 o'clock position: Ribbon shaped marking clip.  Left breast mass, 2:30 o'clock position: Wing shaped marking clip.  Final Assessment: Post Procedure Mammograms for Marker Placement     Diagnosis 1. Breast, right, needle core biopsy, mass, 10 o'clock - FAVOR BIPHASIC EPITHELIAL / STROMAL LESION. - SEE COMMENT. 2. Breast, left, needle core biopsy, mass, 2:30 o'clock - FIBROADENOMA. - NO ATYPIA OR MALIGNANCY IDENTIFIED. - SEE COMMENT. Microscopic Comment 1. The differential of the 10 o'clock mass biopsy includes fibroadenoma, phyllodes, and a papillary lesion. A myofibroblastoma or a hamartomatous lesion are also possible (although the latter is  not favored). Ultimately, definitive characterization is best performed on excision specimen. Dr. [name] has seen this case in consultation with agreement of the above diagnosis and comment. 2. Dr. [name] has seen the second specimen in consultation with agreement.  The patient is a 54 year old female   Other Problems [name], [place]; 10/18/2014 9:27 AM) Asthma Cholelithiasis Diabetes Mellitus Gastroesophageal Reflux Disease Hemorrhoids Sleep Apnea Umbilical Hernia Repair  Past Surgical History [name], CMA; 10/18/2014 9:27 AM) Breast Biopsy Bilateral. Gallbladder Surgery - Laparoscopic Spinal Surgery - Lower Back  Diagnostic Studies History [name], CMA; 10/18/2014 9:27 AM) Colonoscopy within last year Mammogram within last year Pap Smear 1-5 years ago  Allergies [name], CMA; 10/18/2014 9:27 AM) No Known Drug Allergies 10/18/2014  Medication History ([name], CMA; 10/18/2014 9:28 AM) Hydrocodone-Acetaminophen (5-325MG  Tablet, Oral as needed) Active. Zolpidem Tartrate (10MG  Tablet, Oral) Active. Cyclobenzaprine HCl (10MG  Tablet, Oral) Active. MetFORMIN HCl ER (500MG  Tablet ER 24HR, Oral) Active. QUEtiapine Fumarate (300MG  Tablet, Oral) Active. Medications Reconciled  Social History [name], CMA; 10/18/2014 9:27 AM) Caffeine use Carbonated beverages. No alcohol use No drug use Tobacco use Never smoker.  Family History [name], [place]; 10/18/2014 9:27 AM) Arthritis Father. Diabetes Mellitus Father.  Pregnancy / Birth History [name], [place]; 10/18/2014 9:27 AM) Age at menarche 33 years. Gravida 0 Para 0 Regular periods     Review of Systems ([place]; 10/18/2014 9:27 AM) General Present- Fatigue. Not Present- Appetite Loss, Chills, Fever, Night Sweats, Weight Gain and Weight Loss. Skin Not Present- Change in Wart/Mole, Dryness, Hives, Jaundice, New Lesions, Non-Healing Wounds, Rash and Ulcer. HEENT  Present- Seasonal Allergies and Wears glasses/contact lenses. Not Present- Earache, Hearing  Loss, Hoarseness, Nose Bleed, Oral Ulcers, Ringing in the Ears, Sinus Pain, Sore Throat, Visual Disturbances and Yellow Eyes. Respiratory Present- Difficulty Breathing. Not Present- Bloody sputum, Chronic Cough, Snoring and Wheezing. Breast Present- Breast Mass. Not Present- Breast Pain, Nipple Discharge and Skin Changes. Cardiovascular Present- Swelling of Extremities. Not Present- Chest Pain, Difficulty Breathing Lying Down, Leg Cramps, Palpitations, Rapid Heart Rate and Shortness of Breath. Gastrointestinal Present- Hemorrhoids and Vomiting. Not Present- Abdominal Pain, Bloating, Bloody Stool, Change in Bowel Habits, Chronic diarrhea, Constipation, Difficulty Swallowing, Excessive gas, Gets full quickly at meals, Indigestion, Nausea and Rectal Pain. Female Genitourinary Present- Frequency. Not Present- Nocturia, Painful Urination, Pelvic Pain and Urgency. Musculoskeletal Present- Joint Pain and Muscle Pain. Not Present- Back Pain, Joint Stiffness, Muscle Weakness and Swelling of Extremities. Neurological Not Present- Decreased Memory, Fainting, Headaches, Numbness, Seizures, Tingling, Tremor, Trouble walking and Weakness. Psychiatric Present- Change in Sleep Pattern. Not Present- Anxiety, Bipolar, Depression, Fearful and Frequent crying. Endocrine Present- New Diabetes. Not Present- Cold Intolerance, Excessive Hunger, Hair Changes, Heat Intolerance and Hot flashes. Hematology Not Present- Easy Bruising, Excessive bleeding, Gland problems, HIV and Persistent Infections.  Vitals ([name] CMA; 10/18/2014 9:27 AM) 10/18/2014 9:27 AM Weight: 263 lb Height: 62in Body Surface Area: 2.28 m Body Mass Index: 48.1 kg/m Temp.: 47F(Temporal)  Pulse: 81 (Regular)  BP: 136/94 (Sitting, Left Arm, Standard)     Physical Exam (Luan Urbani A. Mataeo Ingwersen MD; 10/18/2014 10:05 AM)  General Mental  Status-Alert. General Appearance-Consistent with stated age. Hydration-Well hydrated. Voice-Normal.  Head and Neck Head-normocephalic, atraumatic with no lesions or palpable masses. Trachea-midline. Thyroid Gland Characteristics - normal size and consistency.  Eye Eyeball - Bilateral-Extraocular movements intact. Sclera/Conjunctiva - Bilateral-No scleral icterus.  Chest and Lung Exam Chest and lung exam reveals -quiet, even and easy respiratory effort with no use of accessory muscles and on auscultation, normal breath sounds, no adventitious sounds and normal vocal resonance. Inspection Chest Wall - Normal. Back - normal.  Breast Breast - Left-Symmetric, Non Tender, No Biopsy scars, no Dimpling, No Inflammation, No Lumpectomy scars, No Mastectomy scars, No Peau d' Orange. Breast - Right-Symmetric, Non Tender, No Biopsy scars, no Dimpling, No Inflammation, No Lumpectomy scars, No Mastectomy scars, No Peau d' Orange. Breast Lump-No Palpable Breast Mass.  Cardiovascular Cardiovascular examination reveals -normal heart sounds, regular rate and rhythm with no murmurs and normal pedal pulses bilaterally.  Abdomen Inspection Inspection of the abdomen reveals - No Hernias. Skin - Scar - no surgical scars. Palpation/Percussion Palpation and Percussion of the abdomen reveal - Soft, Non Tender, No Rebound tenderness, No Rigidity (guarding) and No hepatosplenomegaly. Auscultation Auscultation of the abdomen reveals - Bowel sounds normal.  Neurologic Neurologic evaluation reveals -alert and oriented x 3 with no impairment of recent or remote memory. Mental Status-Normal.  Musculoskeletal Normal Exam - Left-Upper Extremity Strength Normal and Lower Extremity Strength Normal. Normal Exam - Right-Upper Extremity Strength Normal and Lower Extremity Strength Normal.  Lymphatic Head & Neck  General Head & Neck Lymphatics: Bilateral - Description -  Normal. Axillary  General Axillary Region: Bilateral - Description - Normal. Tenderness - Non Tender. Femoral & Inguinal  Generalized Femoral & Inguinal Lymphatics: Bilateral - Description - Normal. Tenderness - Non Tender.    Assessment & Plan (Victor Langenbach A. Jaydin Jalomo MD; 10/18/2014 9:46 AM)  FIBROADENOMA (217  D24.9)  LEFT BREAST MASS (611.72  N63) Impression: biphasic nature of pathology requires excision. Risk of lumpectomy include bleeding, infection, seroma, more surgery, use of seed/wire, wound care, cosmetic deformity and the need  for other treatments, death , blood clots, death. Pt agrees to proceed.  BREAST MASS, RIGHT (611.72  N63) Impression: will remove not exclude biphasic lesion. Risk of lumpectomy include bleeding, infection, seroma, more surgery, use of seed/wire, wound care, cosmetic deformity and the need for other treatments, death , blood clots, death. Pt agrees to proceed.  Current Plans Pt Education - CSS Breast Biopsy Instructions (FLB): discussed with patient and provided information.

## 2014-11-24 NOTE — Anesthesia Procedure Notes (Signed)
Procedure Name: Intubation Date/Time: 11/24/2014 10:55 AM Performed by: Brittin Belnap D Pre-anesthesia Checklist: Patient identified, Emergency Drugs available, Suction available and Patient being monitored Patient Re-evaluated:Patient Re-evaluated prior to inductionOxygen Delivery Method: Circle System Utilized Preoxygenation: Pre-oxygenation with 100% oxygen Intubation Type: IV induction Ventilation: Mask ventilation without difficulty Laryngoscope Size: Mac and 3 Grade View: Grade I Tube type: Oral Tube size: 7.0 mm Number of attempts: 1 Airway Equipment and Method: Stylet and Oral airway Placement Confirmation: ETT inserted through vocal cords under direct vision,  positive ETCO2 and breath sounds checked- equal and bilateral Secured at: 21 cm Tube secured with: Tape Dental Injury: Teeth and Oropharynx as per pre-operative assessment

## 2014-11-25 ENCOUNTER — Encounter (HOSPITAL_BASED_OUTPATIENT_CLINIC_OR_DEPARTMENT_OTHER): Payer: Self-pay | Admitting: Surgery

## 2015-11-28 ENCOUNTER — Other Ambulatory Visit: Payer: Self-pay

## 2015-11-28 DIAGNOSIS — Z1231 Encounter for screening mammogram for malignant neoplasm of breast: Secondary | ICD-10-CM

## 2015-12-02 ENCOUNTER — Ambulatory Visit
Admission: RE | Admit: 2015-12-02 | Discharge: 2015-12-02 | Disposition: A | Discharge status: Home / Self Care | Payer: Medicare Other | Source: Ambulatory Visit

## 2015-12-02 DIAGNOSIS — Z1231 Encounter for screening mammogram for malignant neoplasm of breast: Secondary | ICD-10-CM

## 2015-12-12 ENCOUNTER — Ambulatory Visit: Payer: Medicare Other

## 2016-06-18 ENCOUNTER — Encounter (HOSPITAL_COMMUNITY): Payer: Self-pay | Admitting: Emergency Medicine

## 2016-06-18 ENCOUNTER — Ambulatory Visit (HOSPITAL_COMMUNITY)
Admission: EM | Admit: 2016-06-18 | Discharge: 2016-06-18 | Disposition: A | Discharge status: Home / Self Care | Payer: Medicare Other | Attending: Emergency Medicine | Admitting: Emergency Medicine

## 2016-06-18 DIAGNOSIS — J45901 Unspecified asthma with (acute) exacerbation: Secondary | ICD-10-CM | POA: Diagnosis not present

## 2016-06-18 DIAGNOSIS — J324 Chronic pansinusitis: Secondary | ICD-10-CM

## 2016-06-18 MED ORDER — METHYLPREDNISOLONE 4 MG PO TBPK: ORAL_TABLET | ORAL | 0 refills | Status: DC

## 2016-06-18 MED ORDER — AEROCHAMBER PLUS MISC
2 refills | Status: DC
Start: 2016-06-18 — End: 2016-07-02

## 2016-06-18 MED ORDER — DOXYCYCLINE HYCLATE 100 MG PO CAPS: 100.0000 mg | ORAL_CAPSULE | Freq: Two times a day (BID) | ORAL | 0 refills | Status: DC

## 2016-06-18 NOTE — ED Triage Notes (Signed)
The patient presented to the [facility] with a complaint of sinus pain, pressure and drainage x 2 months. The patient reported that she has been trying to treat her symptoms with OTC meds at home. She further reported that it has gotten worse today and she has also had an epistaxis.

## 2016-06-18 NOTE — ED Provider Notes (Signed)
HPI  SUBJECTIVE:  Virginia Luna is a 55 y.o. female who presents with 2 months of nasal congestion, sinus pain and pressure, purulent nasal drainage, frontal headache. She reports dental pain, postnasal drip. She reports some epistaxis several episodes this week. She reports bilateral ear pain and states that it feels like her hearing is "underwater". She reports nonproductive cough, chest soreness, wheezing and occasional shortness of breath during this time. She states that she is needing her albuterol inhaler on a more frequent and regular basis, normally takes it as needed. She states that she is compliant with her Symbicort. She has tried Mucinex, saline nasal spray, Vicks, albuterol and Symbicort. Symptoms are worse with lying down, no alleviating factors. No antibiotics or oral steroids recently. No antipyretic in the past 6-8 hours. She is past medical history file myalgia, sinusitis. She states that the symptoms are identical to previous episodes sinusitis. She has a past medical history of asthma, of prediabetes. No history of hypertension. PMD: Dr. Assunta Curtis tried family medicine. ENT: Dr. Constance Holster. Per ENT, patient is not allowed to have nasal steroids as it causes epistaxis.   Past Medical History:  Diagnosis Date  . Arthritis   . Asthma   . Chronic pain   . Diabetes mellitus without complication ([place])   . Fibromyalgia   . Sleep apnea    did use cpap-lost 25lb-says she does not need it  . Wears contact lenses     Past Surgical History:  Procedure Laterality Date  . BREAST LUMPECTOMY WITH RADIOACTIVE SEED LOCALIZATION Bilateral 11/24/2014   Procedure: BILATERAL BREAST LUMPECTOMY WITH RADIOACTIVE SEED LOCALIZATION;  Surgeon: [name], MD;  Location: [facility];  Service: General;  Laterality: Bilateral;  . CHOLECYSTECTOMY    . COLONOSCOPY    . DILATION AND CURETTAGE OF UTERUS    . LUMBAR LAMINECTOMY  2010  . UMBILICAL HERNIA REPAIR     age 76  . UPPER  GI ENDOSCOPY      History reviewed. No pertinent family history.  Social History  Substance Use Topics  . Smoking status: Never Smoker  . Smokeless tobacco: Never Used  . Alcohol use No    No current facility-administered medications for this encounter.   Current Outpatient Prescriptions:  .  albuterol (PROVENTIL HFA;VENTOLIN HFA) 108 (90 BASE) MCG/ACT inhaler, Inhale into the lungs every 6 (six) hours as needed for wheezing or shortness of breath., Disp: , Rfl:  .  budesonide-formoterol (SYMBICORT) 160-4.5 MCG/ACT inhaler, Inhale 2 puffs into the lungs 2 (two) times daily., Disp: , Rfl:  .  cyclobenzaprine (FLEXERIL) 10 MG tablet, Take 10 mg by mouth at bedtime., Disp: , Rfl:  .  HYDROcodone-acetaminophen (NORCO/VICODIN) 5-325 MG per tablet, Take 1 tablet by mouth every 6 (six) hours as needed for moderate pain (mostly takes at night)., Disp: , Rfl:  .  metFORMIN (GLUMETZA) 500 MG (MOD) 24 hr tablet, Take 500 mg by mouth at bedtime., Disp: , Rfl:  .  oxycodone (OXY-IR) 5 MG capsule, Take 5 mg by mouth every 4 (four) hours as needed., Disp: , Rfl:  .  oxyCODONE-acetaminophen (ROXICET) 5-325 MG per tablet, Take 1 tablet by mouth every 4 (four) hours as needed., Disp: 30 tablet, Rfl: 0 .  QUEtiapine (SEROQUEL) 300 MG tablet, Take 300 mg by mouth at bedtime., Disp: , Rfl:  .  zolpidem (AMBIEN) 10 MG tablet, Take 10 mg by mouth at bedtime as needed for sleep., Disp: , Rfl:  .  doxycycline (VIBRAMYCIN) 100  MG capsule, Take 1 capsule (100 mg total) by mouth 2 (two) times daily. X 7 days, Disp: 14 capsule, Rfl: 0 .  Melatonin 3 MG CAPS, Take by mouth., Disp: , Rfl:  .  methylPREDNISolone (MEDROL DOSEPAK) 4 MG TBPK tablet, Take by mouth as directed., Disp: 21 tablet, Rfl: 0 .  promethazine (PHENERGAN) 12.5 MG tablet, Take 1 tablet (12.5 mg total) by mouth every 6 (six) hours as needed for nausea or vomiting., Disp: 30 tablet, Rfl: 0 .  Spacer/Aero-Holding Chambers (AEROCHAMBER PLUS) inhaler, Use  as instructed, Disp: 1 each, Rfl: 2 .  traMADol (ULTRAM) 50 MG tablet, Take 1 tablet (50 mg total) by mouth every 6 (six) hours as needed., Disp: 30 tablet, Rfl: 0 .  Vitamin D, Ergocalciferol, (DRISDOL) 50000 UNITS CAPS capsule, Take 50,000 Units by mouth every 7 (seven) days., Disp: , Rfl:   Allergies  Allergen Reactions  . Rocephin [Ceftriaxone] Anaphylaxis  . Morphine And Related Nausea And Vomiting  . Sulfa Antibiotics Itching     ROS  As noted in HPI.   Physical Exam  BP 135/64 (BP Location: Left Arm)   Pulse 102   Temp 98.7 F (37.1 C) (Oral)   Resp 20   LMP 05/21/2016   SpO2 100%   Constitutional: Well developed, well nourished, no acute distress Eyes:  EOMI, conjunctiva normal bilaterally HENT: Normocephalic, atraumatic,mucus membranes moist. TMs normal bilaterally. Positive erythematous, swollen turbinates. Viable nasal mucosa. Positive nasal congestion. Positive maxillary and frontal sinus tenderness No active epistaxis. Oropharynx normal. Positive postnasal drip.  Neck: No cervical lymphadenopathy. Respiratory: Normal inspiratory effort, Fair air movement, no appreciable wheezes. Positive chest wall tenderness.  Cardiovascular: Mild regular tachycardia, no murmurs, rubs, gallops  GI: nondistended skin: No rash, skin intact Musculoskeletal: no deformities Neurologic: Alert & oriented x 3, no focal neuro deficits Psychiatric: Speech and behavior appropriate   ED Course   Medications - No data to display  No orders of the defined types were placed in this encounter.   No results found for this or any previous visit (from the past 24 hour(s)). No results found.  ED Clinical Impression  Chronic pansinusitis  Mild asthma with exacerbation, unspecified whether persistent   ED Assessment/Plan Presentation most consistent with a sinusitis with a mild asthma exacerbation per history. We'll send home with doxycycline, continue Mucinex, start saline nasal  irrigation. She is to see use her albuterol every 4-6 hours. We'll prescribe her a spacer. She states that she does not need prescription of albuterol. We'll also sent home with a Medrol dosepak.  she is to follow-up with her primary care physician in several days. Patient states that she is a prediabetic and only takes one metformin per day. She does not need to check her sugars at home.  Discussed MDM, plan and followup with patient. Discussed sn/sx that should prompt return to the ED. Patient agrees with plan.   Meds ordered this encounter  Medications  . doxycycline (VIBRAMYCIN) 100 MG capsule    Sig: Take 1 capsule (100 mg total) by mouth 2 (two) times daily. X 7 days    Dispense:  14 capsule    Refill:  0  . Spacer/Aero-Holding Chambers (AEROCHAMBER PLUS) inhaler    Sig: Use as instructed    Dispense:  1 each    Refill:  2  . methylPREDNISolone (MEDROL DOSEPAK) 4 MG TBPK tablet    Sig: Take by mouth as directed.    Dispense:  21 tablet    Refill:  0    *This clinic note was created using [employer]. Therefore, there may be occasional mistakes despite careful proofreading.  ?    Melynda Ripple, MD 06/18/16 2040

## 2016-06-18 NOTE — Discharge Instructions (Signed)
Take the medication as written.  You may take 600-800 mg of motrin with 1 gram of tylenol up to 3 times a day as needed for pain. This is an effective combination for pain.  Do not exceed 4 g of Tylenol from all sources in one day.  Use a neti pot or the NeilMed sinus rinse as often as you want to to reduce nasal congestion. Follow the directions on the box.   Go to www.goodrx.com to look up your medications. This will give you a list of where you can find your prescriptions at the most affordable prices.

## 2016-07-02 ENCOUNTER — Emergency Department (HOSPITAL_COMMUNITY)
Admission: EM | Admit: 2016-07-02 | Discharge: 2016-07-03 | Disposition: A | Discharge status: Home / Self Care | Payer: Medicare Other | Attending: Emergency Medicine | Admitting: Emergency Medicine

## 2016-07-02 ENCOUNTER — Encounter (HOSPITAL_COMMUNITY): Payer: Self-pay | Admitting: Emergency Medicine

## 2016-07-02 DIAGNOSIS — J45909 Unspecified asthma, uncomplicated: Secondary | ICD-10-CM | POA: Insufficient documentation

## 2016-07-02 DIAGNOSIS — Z79899 Other long term (current) drug therapy: Secondary | ICD-10-CM | POA: Insufficient documentation

## 2016-07-02 DIAGNOSIS — K920 Hematemesis: Secondary | ICD-10-CM | POA: Insufficient documentation

## 2016-07-02 DIAGNOSIS — Z7984 Long term (current) use of oral hypoglycemic drugs: Secondary | ICD-10-CM | POA: Insufficient documentation

## 2016-07-02 DIAGNOSIS — E119 Type 2 diabetes mellitus without complications: Secondary | ICD-10-CM | POA: Diagnosis not present

## 2016-07-02 LAB — COMPREHENSIVE METABOLIC PANEL
ALT: 27 U/L (ref 14–54)
ANION GAP: 10 (ref 5–15)
AST: 23 U/L (ref 15–41)
Albumin: 4.2 g/dL (ref 3.5–5.0)
Alkaline Phosphatase: 96 U/L (ref 38–126)
BILIRUBIN TOTAL: 0.4 mg/dL (ref 0.3–1.2)
BUN: 7 mg/dL (ref 6–20)
CALCIUM: 9.3 mg/dL (ref 8.9–10.3)
CO2: 24 mmol/L (ref 22–32)
Chloride: 107 mmol/L (ref 101–111)
Creatinine, Ser: 0.54 mg/dL (ref 0.44–1.00)
GFR calc non Af Amer: >60 mL/min (ref >60)
Glucose, Bld: 103 mg/dL — ABNORMAL HIGH (ref 65–99)
Potassium: 3.8 mmol/L (ref 3.5–5.1)
Sodium: 141 mmol/L (ref 135–145)
TOTAL PROTEIN: 7.2 g/dL (ref 6.5–8.1)

## 2016-07-02 LAB — CBC
HCT: 37.6 % (ref 36.0–46.0)
HEMOGLOBIN: 11.8 g/dL — AB (ref 12.0–15.0)
MCH: 25.8 pg — ABNORMAL LOW (ref 26.0–34.0)
MCHC: 31.4 g/dL (ref 30.0–36.0)
MCV: 82.1 fL (ref 78.0–100.0)
Platelets: 442 10*3/uL — ABNORMAL HIGH (ref 150–400)
RBC: 4.58 MIL/uL (ref 3.87–5.11)
RDW: 17.9 % — ABNORMAL HIGH (ref 11.5–15.5)
WBC: 11.1 10*3/uL — AB (ref 4.0–10.5)

## 2016-07-02 LAB — I-STAT CHEM 8, ED
BUN: 7 mg/dL (ref 6–20)
CREATININE: 0.5 mg/dL (ref 0.44–1.00)
Calcium, Ion: 1.09 mmol/L — ABNORMAL LOW (ref 1.15–1.40)
Chloride: 106 mmol/L (ref 101–111)
Glucose, Bld: 107 mg/dL — ABNORMAL HIGH (ref 65–99)
HEMATOCRIT: 38 % (ref 36.0–46.0)
HEMOGLOBIN: 12.9 g/dL (ref 12.0–15.0)
Potassium: 3.9 mmol/L (ref 3.5–5.1)
SODIUM: 141 mmol/L (ref 135–145)
TCO2: 27 mmol/L (ref 0–100)

## 2016-07-02 LAB — TYPE AND SCREEN
ABO/RH(D): A POS
Antibody Screen: NEGATIVE

## 2016-07-02 LAB — LIPASE, BLOOD: Lipase: 25 U/L (ref 11–51)

## 2016-07-02 MED ORDER — OMEPRAZOLE 20 MG PO CPDR: 20.0000 mg | DELAYED_RELEASE_CAPSULE | Freq: Every day | ORAL | 0 refills | Status: DC

## 2016-07-02 MED ORDER — SUCRALFATE 1 GM/10ML PO SUSP: 1.0000 g | Freq: Three times a day (TID) | ORAL | 0 refills | Status: DC

## 2016-07-02 MED ORDER — TRIAMCINOLONE ACETONIDE 55 MCG/ACT NA AERO: 2.0000 | INHALATION_SPRAY | Freq: Every day | NASAL | 0 refills | Status: DC

## 2016-07-02 MED ORDER — GI COCKTAIL ~~LOC~~
30.0000 mL | Freq: Once | ORAL | Status: AC
  Administered 2016-07-02: 30 mL via ORAL
  Filled 2016-07-02: qty 30

## 2016-07-02 NOTE — ED Notes (Signed)
Patient stated she is "avery hard stick" and has to get "stuck multiple times" every time she comes and would "prefer to wait until I get a room to see the doctor so they can just stick me for the IV and get blood at the same time"; Janett Billow, RN aware

## 2016-07-02 NOTE — ED Provider Notes (Signed)
[facility] DEPT Provider Note   CSN: [id number] Arrival date & time: 07/02/16  1811     History   Chief Complaint Chief Complaint  Patient presents with  . Hematemesis    HPI Virginia Luna is a 55 y.o. female.  HPI  Patient presents with concern of episodic hematemesis and/or hemoptysis. She notes multiple medical issues including chronic pain with fibromyalgia. With best few weeks she has had persistent sinus discomfort as well as generalized discomfort. She has been seen by urgent care physicians during this illness, has been taking OTC medication including Mucinex with some relief. However, the patient has had multiple episodes of both posttussive emesis and possibly hematemesis since the illness began. No syncope, no chest pain, no abdominal pain. No new medications taken for this, though she has been taking the aforementioned OTC medications for sinus pressure relief.   Past Medical History:  Diagnosis Date  . Arthritis   . Asthma   . Chronic pain   . Diabetes mellitus without complication ([place])   . Fibromyalgia   . Sleep apnea    did use cpap-lost 25lb-says she does not need it  . Wears contact lenses     There are no active problems to display for this patient.   Past Surgical History:  Procedure Laterality Date  . BREAST LUMPECTOMY WITH RADIOACTIVE SEED LOCALIZATION Bilateral 11/24/2014   Procedure: BILATERAL BREAST LUMPECTOMY WITH RADIOACTIVE SEED LOCALIZATION;  Surgeon: [name], MD;  Location: [facility];  Service: General;  Laterality: Bilateral;  . CHOLECYSTECTOMY    . COLONOSCOPY    . DILATION AND CURETTAGE OF UTERUS    . LUMBAR LAMINECTOMY  2010  . UMBILICAL HERNIA REPAIR     age 89  . UPPER GI ENDOSCOPY      OB History    No data available       Home Medications    Prior to Admission medications   Medication Sig Start Date End Date Taking? Authorizing Provider  albuterol (PROVENTIL HFA;VENTOLIN HFA) 108 (90  BASE) MCG/ACT inhaler Inhale into the lungs every 6 (six) hours as needed for wheezing or shortness of breath.    Historical Provider, MD  budesonide-formoterol (SYMBICORT) 160-4.5 MCG/ACT inhaler Inhale 2 puffs into the lungs 2 (two) times daily.    Historical Provider, MD  cyclobenzaprine (FLEXERIL) 10 MG tablet Take 10 mg by mouth at bedtime.    Historical Provider, MD  doxycycline (VIBRAMYCIN) 100 MG capsule Take 1 capsule (100 mg total) by mouth 2 (two) times daily. X 7 days 06/18/16   Melynda Ripple, MD  HYDROcodone-acetaminophen (NORCO/VICODIN) 5-325 MG per tablet Take 1 tablet by mouth every 6 (six) hours as needed for moderate pain (mostly takes at night).    Historical Provider, MD  Melatonin 3 MG CAPS Take by mouth.    Historical Provider, MD  metFORMIN (GLUMETZA) 500 MG (MOD) 24 hr tablet Take 500 mg by mouth at bedtime.    Historical Provider, MD  methylPREDNISolone (MEDROL DOSEPAK) 4 MG TBPK tablet Take by mouth as directed. 06/18/16   Melynda Ripple, MD  oxycodone (OXY-IR) 5 MG capsule Take 5 mg by mouth every 4 (four) hours as needed.    Historical Provider, MD  oxyCODONE-acetaminophen (ROXICET) 5-325 MG per tablet Take 1 tablet by mouth every 4 (four) hours as needed. 11/24/14   [name], MD  promethazine (PHENERGAN) 12.5 MG tablet Take 1 tablet (12.5 mg total) by mouth every 6 (six) hours as needed for nausea or vomiting.  11/24/14   [name], MD  QUEtiapine (SEROQUEL) 300 MG tablet Take 300 mg by mouth at bedtime.    Historical Provider, MD  Spacer/Aero-Holding Chambers (AEROCHAMBER PLUS) inhaler Use as instructed 06/18/16   Melynda Ripple, MD  traMADol (ULTRAM) 50 MG tablet Take 1 tablet (50 mg total) by mouth every 6 (six) hours as needed. 11/24/14   [name], MD  Vitamin D, Ergocalciferol, (DRISDOL) 50000 UNITS CAPS capsule Take 50,000 Units by mouth every 7 (seven) days.    Historical Provider, MD  zolpidem (AMBIEN) 10 MG tablet Take 10 mg by mouth at bedtime as  needed for sleep.    Historical Provider, MD    Family History History reviewed. No pertinent family history.  Social History Social History  Substance Use Topics  . Smoking status: Never Smoker  . Smokeless tobacco: Never Used  . Alcohol use No     Allergies   Rocephin [ceftriaxone]; Morphine and related; and Sulfa antibiotics   Review of Systems Review of Systems  Constitutional:       Per HPI, otherwise negative  HENT:       Per HPI, otherwise negative  Respiratory:       Per HPI, otherwise negative  Cardiovascular:       Per HPI, otherwise negative  Gastrointestinal: Positive for nausea and vomiting. Negative for abdominal pain.  Endocrine:       Negative aside from HPI  Genitourinary:       Neg aside from HPI   Musculoskeletal:       Per HPI, otherwise negative  Skin: Negative.   Neurological: Negative for syncope.     Physical Exam Updated Vital Signs BP 154/90 (BP Location: Left Arm)   Pulse 106   Temp 98.6 F (37 C) (Oral)   Resp 20   Ht 5\' 3"  (1.6 m)   Wt 270 lb (122.5 kg)   LMP 05/21/2016   SpO2 98%   BMI 47.83 kg/m   Physical Exam  Constitutional: She is oriented to person, place, and time.  Obese middle-aged female standing up in no distress speaking normally interacting appropriately  HENT:  Head: Normocephalic and atraumatic.  Eyes: Conjunctivae and EOM are normal.  Cardiovascular: Regular rhythm.  Tachycardia present.   Pulmonary/Chest: Effort normal and breath sounds normal. No stridor. No respiratory distress.  Abdominal: She exhibits no distension.  Musculoskeletal: She exhibits no edema.  Neurological: She is alert and oriented to person, place, and time. No cranial nerve deficit.  Skin: Skin is warm and dry.  Psychiatric: She has a normal mood and affect.  Nursing note and vitals reviewed.    ED Treatments / Results  Labs (all labs ordered are listed, but only abnormal results are displayed) Labs Reviewed  COMPREHENSIVE  METABOLIC PANEL - Abnormal; Notable for the following:       Result Value   Glucose, Bld 103 (*)    All other components within normal limits  CBC - Abnormal; Notable for the following:    WBC 11.1 (*)    Hemoglobin 11.8 (*)    MCH 25.8 (*)    RDW 17.9 (*)    Platelets 442 (*)    All other components within normal limits  I-STAT CHEM 8, ED - Abnormal; Notable for the following:    Glucose, Bld 107 (*)    Calcium, Ion 1.09 (*)    All other components within normal limits  LIPASE, BLOOD  URINALYSIS, ROUTINE W REFLEX MICROSCOPIC (NOT AT [facility])  TYPE  AND SCREEN  ABO/RH   Procedures Procedures (including critical care time)  Medications Ordered in ED Medications  gi cocktail (Maalox,Lidocaine,Donnatal) (not administered)     Initial Impression / Assessment and Plan / ED Course  I have reviewed the triage vital signs and the nursing notes.  Pertinent labs & imaging results that were available during my care of the patient were reviewed by me and considered in my medical decision making (see chart for details).  Clinical Course     11:55 PM Patient in no distress, awake, alert, appropriately interactive. No episodes of vomiting here. We had a very lengthy conversation about the patient's presentation, some suspicion for medication related bloody emesis. We discussed stopping antihistamines, Mucinex, switching to inhaled steroid for her ongoing sinus congestion. We discussed reassuring labs, and the patient states that she will follow up with GI tomorrow.  Final Clinical Impressions(s) / ED Diagnoses  Well-appearing female presents with concern of hematemesis and/or hemoptysis. Here, for several hours of monitoring the patient has no episodes of vomiting, no hemoptysis either. She was hemodynamically aside from mild tachycardia, possibly due to the patient's use of Mucinex, albuterol, but no evidence for exsanguination, stable hemoglobin. After lengthy conversations about  medication changes, importance of following up, the patient was discharged to follow-up with GI.    Carmin Muskrat, MD 07/02/16 [phone]

## 2016-07-02 NOTE — ED Triage Notes (Signed)
Pt requests to have blood drawn in the back

## 2016-07-02 NOTE — ED Triage Notes (Signed)
Pt here for vomiting with some blood today

## 2016-07-03 LAB — ABO/RH: ABO/RH(D): A POS

## 2016-07-17 ENCOUNTER — Encounter (HOSPITAL_COMMUNITY): Payer: Self-pay | Admitting: *Deleted

## 2016-07-18 ENCOUNTER — Other Ambulatory Visit: Payer: Self-pay | Admitting: Gastroenterology

## 2016-07-23 ENCOUNTER — Ambulatory Visit (HOSPITAL_COMMUNITY): Payer: Medicare Other | Admitting: Anesthesiology

## 2016-07-23 ENCOUNTER — Ambulatory Visit (HOSPITAL_COMMUNITY)
Admission: RE | Admit: 2016-07-23 | Discharge: 2016-07-23 | Disposition: A | Discharge status: Home / Self Care | Payer: Medicare Other | Source: Ambulatory Visit | Attending: Gastroenterology | Admitting: Gastroenterology

## 2016-07-23 ENCOUNTER — Encounter (HOSPITAL_COMMUNITY): Payer: Self-pay | Admitting: Anesthesiology

## 2016-07-23 ENCOUNTER — Encounter (HOSPITAL_COMMUNITY)
Admission: RE | Disposition: A | Discharge status: Home / Self Care | Payer: Self-pay | Source: Ambulatory Visit | Attending: Gastroenterology

## 2016-07-23 DIAGNOSIS — Z7984 Long term (current) use of oral hypoglycemic drugs: Secondary | ICD-10-CM | POA: Diagnosis not present

## 2016-07-23 DIAGNOSIS — Z6841 Body Mass Index (BMI) 40.0 and over, adult: Secondary | ICD-10-CM | POA: Insufficient documentation

## 2016-07-23 DIAGNOSIS — Z79899 Other long term (current) drug therapy: Secondary | ICD-10-CM | POA: Insufficient documentation

## 2016-07-23 DIAGNOSIS — J45909 Unspecified asthma, uncomplicated: Secondary | ICD-10-CM | POA: Insufficient documentation

## 2016-07-23 DIAGNOSIS — M797 Fibromyalgia: Secondary | ICD-10-CM | POA: Diagnosis not present

## 2016-07-23 DIAGNOSIS — K449 Diaphragmatic hernia without obstruction or gangrene: Secondary | ICD-10-CM | POA: Diagnosis not present

## 2016-07-23 DIAGNOSIS — K259 Gastric ulcer, unspecified as acute or chronic, without hemorrhage or perforation: Secondary | ICD-10-CM | POA: Insufficient documentation

## 2016-07-23 DIAGNOSIS — E119 Type 2 diabetes mellitus without complications: Secondary | ICD-10-CM | POA: Diagnosis not present

## 2016-07-23 DIAGNOSIS — Z7951 Long term (current) use of inhaled steroids: Secondary | ICD-10-CM | POA: Insufficient documentation

## 2016-07-23 DIAGNOSIS — K920 Hematemesis: Secondary | ICD-10-CM | POA: Insufficient documentation

## 2016-07-23 HISTORY — PX: ESOPHAGOGASTRODUODENOSCOPY (EGD) WITH PROPOFOL: SHX5813

## 2016-07-23 LAB — GLUCOSE, CAPILLARY: Glucose-Capillary: 125 mg/dL — ABNORMAL HIGH (ref 65–99)

## 2016-07-23 SURGERY — ESOPHAGOGASTRODUODENOSCOPY (EGD) WITH PROPOFOL
Anesthesia: Monitor Anesthesia Care

## 2016-07-23 SURGERY — EGD (ESOPHAGOGASTRODUODENOSCOPY)
Anesthesia: Monitor Anesthesia Care

## 2016-07-23 MED ORDER — ONDANSETRON HCL 4 MG/2ML IJ SOLN
INTRAMUSCULAR | Status: DC | PRN
  Administered 2016-07-23: 4 mg via INTRAVENOUS

## 2016-07-23 MED ORDER — LACTATED RINGERS IV SOLN
INTRAVENOUS | Status: DC
  Administered 2016-07-23: 1000 mL via INTRAVENOUS
  Administered 2016-07-23: 10:00:00 via INTRAVENOUS

## 2016-07-23 MED ORDER — MIDAZOLAM HCL 2 MG/2ML IJ SOLN
INTRAMUSCULAR | Status: AC
  Filled 2016-07-23: qty 2

## 2016-07-23 MED ORDER — LACTATED RINGERS IV SOLN: INTRAVENOUS | 0 refills | Status: DC

## 2016-07-23 MED ORDER — ALBUTEROL SULFATE HFA 108 (90 BASE) MCG/ACT IN AERS
INHALATION_SPRAY | RESPIRATORY_TRACT | Status: DC | PRN
  Administered 2016-07-23: 2 via RESPIRATORY_TRACT

## 2016-07-23 MED ORDER — SODIUM CHLORIDE 0.9 % IV SOLN: INTRAVENOUS | Status: DC

## 2016-07-23 MED ORDER — ALBUTEROL SULFATE HFA 108 (90 BASE) MCG/ACT IN AERS
INHALATION_SPRAY | RESPIRATORY_TRACT | Status: AC
  Filled 2016-07-23: qty 6.7

## 2016-07-23 MED ORDER — PROPOFOL 500 MG/50ML IV EMUL
INTRAVENOUS | Status: DC | PRN
  Administered 2016-07-23 (×2): 40 mg via INTRAVENOUS

## 2016-07-23 MED ORDER — PROPOFOL 500 MG/50ML IV EMUL
INTRAVENOUS | Status: DC | PRN
  Administered 2016-07-23: 100 ug/kg/min via INTRAVENOUS

## 2016-07-23 MED ORDER — PROPOFOL 10 MG/ML IV BOLUS
INTRAVENOUS | Status: AC
  Filled 2016-07-23: qty 40

## 2016-07-23 MED ORDER — MIDAZOLAM HCL 5 MG/5ML IJ SOLN
INTRAMUSCULAR | Status: DC | PRN
  Administered 2016-07-23: 2 mg via INTRAVENOUS

## 2016-07-23 SURGICAL SUPPLY — 15 items

## 2016-07-23 NOTE — Discharge Instructions (Signed)
YOU HAD AN ENDOSCOPIC PROCEDURE TODAY: Refer to the procedure report and other information in the discharge instructions given to you for any specific questions about what was found during the examination. If this information does not answer your questions, please call [facility] office at [phone] to clarify.  ° °YOU SHOULD EXPECT: Some feelings of bloating in the abdomen. Passage of more gas than usual. Walking can help get rid of the air that was put into your GI tract during the procedure and reduce the bloating. If you had a lower endoscopy (such as a colonoscopy or flexible sigmoidoscopy) you may notice spotting of blood in your stool or on the toilet paper. Some abdominal soreness may be present for a day or two, also. ° °DIET: Your first meal following the procedure should be a light meal and then it is ok to progress to your normal diet. A half-sandwich or bowl of soup is an example of a good first meal. Heavy or fried foods are harder to digest and may make you feel nauseous or bloated. Drink plenty of fluids but you should avoid alcoholic beverages for 24 hours. If you had a esophageal dilation, please see attached instructions for diet.  ° °ACTIVITY: Your care partner should take you home directly after the procedure. You should plan to take it easy, moving slowly for the rest of the day. You can resume normal activity the day after the procedure however YOU SHOULD NOT DRIVE, use power tools, machinery or perform tasks that involve climbing or major physical exertion for 24 hours (because of the sedation medicines used during the test).  ° °SYMPTOMS TO REPORT IMMEDIATELY: °A gastroenterologist can be reached at any hour. Please call [phone]  for any of the following symptoms:  °Following lower endoscopy (colonoscopy, flexible sigmoidoscopy) °Excessive amounts of blood in the stool  °Significant tenderness, worsening of abdominal pains  °Swelling of the abdomen that is new, acute  °Fever of 100° or  higher  °Following upper endoscopy (EGD, EUS, ERCP, esophageal dilation) °Vomiting of blood or coffee ground material  °New, significant abdominal pain  °New, significant chest pain or pain under the shoulder blades  °Painful or persistently difficult swallowing  °New shortness of breath  °Black, tarry-looking or red, bloody stools ° °FOLLOW UP:  °If any biopsies were taken you will be contacted by phone or by letter within the next 1-3 weeks. Call [phone]  if you have not heard about the biopsies in 3 weeks.  °Please also call with any specific questions about appointments or follow up tests. °YOU HAD AN ENDOSCOPIC PROCEDURE TODAY: Refer to the procedure report and other information in the discharge instructions given to you for any specific questions about what was found during the examination. If this information does not answer your questions, please call [facility] office at [phone] to clarify.  ° °YOU SHOULD EXPECT: Some feelings of bloating in the abdomen. Passage of more gas than usual. Walking can help get rid of the air that was put into your GI tract during the procedure and reduce the bloating. If you had a lower endoscopy (such as a colonoscopy or flexible sigmoidoscopy) you may notice spotting of blood in your stool or on the toilet paper. Some abdominal soreness may be present for a day or two, also. ° °DIET: Your first meal following the procedure should be a light meal and then it is ok to progress to your normal diet. A half-sandwich or bowl of soup is an example   of a good first meal. Heavy or fried foods are harder to digest and may make you feel nauseous or bloated. Drink plenty of fluids but you should avoid alcoholic beverages for 24 hours. If you had a esophageal dilation, please see attached instructions for diet.  ° °ACTIVITY: Your care partner should take you home directly after the procedure. You should plan to take it easy, moving slowly for the rest of the day. You can resume  normal activity the day after the procedure however YOU SHOULD NOT DRIVE, use power tools, machinery or perform tasks that involve climbing or major physical exertion for 24 hours (because of the sedation medicines used during the test).  ° °SYMPTOMS TO REPORT IMMEDIATELY: °A gastroenterologist can be reached at any hour. Please call [phone]  for any of the following symptoms:  °Following lower endoscopy (colonoscopy, flexible sigmoidoscopy) °Excessive amounts of blood in the stool  °Significant tenderness, worsening of abdominal pains  °Swelling of the abdomen that is new, acute  °Fever of 100° or higher  °Following upper endoscopy (EGD, EUS, ERCP, esophageal dilation) °Vomiting of blood or coffee ground material  °New, significant abdominal pain  °New, significant chest pain or pain under the shoulder blades  °Painful or persistently difficult swallowing  °New shortness of breath  °Black, tarry-looking or red, bloody stools ° °FOLLOW UP:  °If any biopsies were taken you will be contacted by phone or by letter within the next 1-3 weeks. Call [phone]  if you have not heard about the biopsies in 3 weeks.  °Please also call with any specific questions about appointments or follow up tests. ° °

## 2016-07-23 NOTE — Op Note (Signed)
[facility] Patient Name: Virginia Luna Procedure Date: 07/23/2016 MRN: [id number] Attending MD: Nancy Fetter Dr., MD Date of Birth: February 02, 1961 CSN: [id number] Age: 55 Admit Type: Outpatient Procedure:                Upper GI endoscopy Indications:              Hematemesis Providers:                Jeneen Rinks L. Shakthi Scipio Dr., MD, Cleda Daub, RN, Despina Pole Tech, Technician, Arnoldo Hooker, CRNA Referring MD:              Medicines:                Monitored Anesthesia Care Complications:            No immediate complications. Estimated Blood Loss:     Estimated blood loss: none. Procedure:                Pre-Anesthesia Assessment:                           - Prior to the procedure, a History and Physical                            was performed, and patient medications and                            allergies were reviewed. The patient's tolerance of                            previous anesthesia was also reviewed. The risks                            and benefits of the procedure and the sedation                            options and risks were discussed with the patient.                            All questions were answered, and informed consent                            was obtained. Prior Anticoagulants: The patient has                            taken no previous anticoagulant or antiplatelet                            agents. ASA Grade Assessment: III - A patient with                            severe systemic disease. After reviewing the risks  and benefits, the patient was deemed in                            satisfactory condition to undergo the procedure.                           After obtaining informed consent, the endoscope was                            passed under direct vision. Throughout the                            procedure, the patient's blood pressure, pulse, and   oxygen saturations were monitored continuously. The                            Endoscope was introduced through the mouth, and                            advanced to the second part of duodenum. The upper                            GI endoscopy was accomplished without difficulty.                            The patient tolerated the procedure well. Scope In: Scope Out: Findings:      A medium-sized hiatal hernia was present.      Widely patent GE junction      There is no endoscopic evidence of Barrett's esophagus, esophagitis,       inflammation, stricture, ulcerations or varices in the entire esophagus.      Two non-bleeding linear gastric ulcers with no stigmata of bleeding were       found on the greater curvature of the stomach. Biopsies were taken with       a cold forceps for histology.      The examined duodenum was normal. Impression:               - Medium-sized hiatal hernia.                           - Non-bleeding gastric ulcers with no stigmata of                            bleeding. Biopsied.                           - Normal examined duodenum. Moderate Sedation:      MAC by anesthesia Recommendation:           - Patient has a contact number available for                            emergencies. The signs and symptoms of potential                            delayed complications were discussed with  the                            patient. Return to normal activities tomorrow.                            Written discharge instructions were provided to the                            patient.                           - Resume previous diet.                           - Use Prilosec (omeprazole) 20 mg PO BID.                           - Patient has a contact number available for                            emergencies. The signs and symptoms of potential                            delayed complications were discussed with the                            patient. Return to  normal activities tomorrow.                            Written discharge instructions were provided to the                            patient.                           - Resume previous diet.                           - No aspirin, ibuprofen, naproxen, or other                            non-steroidal anti-inflammatory drugs.                           - Return to endoscopist in 6 weeks. Procedure Code(s):        --- Professional ---                           [phone], Esophagogastroduodenoscopy, flexible,                            transoral; with biopsy, single or multiple Diagnosis Code(s):        --- Professional ---                           K25.9, Gastric ulcer, unspecified as  acute or                            chronic, without hemorrhage or perforation                           K92.0, Hematemesis                           K44.9, Diaphragmatic hernia without obstruction or                            gangrene CPT copyright 2016 [employer]. All rights reserved. The codes documented in this report are preliminary and upon coder review may  be revised to meet current compliance requirements. Nancy Fetter Dr., MD 07/23/2016 10:23:10 AM This report has been signed electronically. Number of Addenda: 0

## 2016-07-23 NOTE — Anesthesia Postprocedure Evaluation (Signed)
Anesthesia Post Note  Patient: Virginia Luna  Procedure(s) Performed: Procedure(s) (LRB): ESOPHAGOGASTRODUODENOSCOPY (EGD) WITH PROPOFOL (N/A)  Patient location during evaluation: PACU Anesthesia Type: MAC Level of consciousness: awake and alert Pain management: pain level controlled Vital Signs Assessment: post-procedure vital signs reviewed and stable Respiratory status: spontaneous breathing, nonlabored ventilation, respiratory function stable and patient connected to nasal cannula oxygen Cardiovascular status: stable and blood pressure returned to baseline Anesthetic complications: no    Last Vitals:  Vitals:   07/23/16 1020 07/23/16 1030  BP: (!) 165/85 (!) 170/96  Pulse:    Resp:    Temp:      Last Pain:  Vitals:   07/23/16 1019  TempSrc: Oral                 Holton Sidman S

## 2016-07-23 NOTE — Transfer of Care (Signed)
Immediate Anesthesia Transfer of Care Note  Patient: Virginia Luna  Procedure(s) Performed: Procedure(s): ESOPHAGOGASTRODUODENOSCOPY (EGD) WITH PROPOFOL (N/A)  Patient Location: PACU  Anesthesia Type:MAC  Level of Consciousness:  sedated, patient cooperative and responds to stimulation  Airway & Oxygen Therapy:Patient Spontanous Breathing and Patient connected to face mask oxgen  Post-op Assessment:  Report given to PACU RN and Post -op Vital signs reviewed and stable  Post vital signs:  Reviewed and stable  Last Vitals:  Vitals:   07/23/16 0912  BP: (!) 147/90  Resp: (!) 21  Temp: [id number] C    Complications: No apparent anesthesia complications

## 2016-07-23 NOTE — H&P (Signed)
Subjective:   Patient is a 55 y.o. female presents with History of hematemesis requiring ER visit. Procedure including risks and benefits discussed in office.  There are no active problems to display for this patient.  Past Medical History:  Diagnosis Date  . Arthritis   . Asthma   . Chronic pain   . Diabetes mellitus without complication ([place])   . Fibromyalgia   . Sleep apnea    did use cpap-lost 25lb-says she does not need it  . Wears contact lenses     Past Surgical History:  Procedure Laterality Date  . BREAST LUMPECTOMY WITH RADIOACTIVE SEED LOCALIZATION Bilateral 11/24/2014   Procedure: BILATERAL BREAST LUMPECTOMY WITH RADIOACTIVE SEED LOCALIZATION;  Surgeon: [name], MD;  Location: [facility];  Service: General;  Laterality: Bilateral;  . CHOLECYSTECTOMY    . COLONOSCOPY    . DILATION AND CURETTAGE OF UTERUS    . LUMBAR LAMINECTOMY  2010  . UMBILICAL HERNIA REPAIR     age 71  . UPPER GI ENDOSCOPY      Prescriptions Prior to Admission  Medication Sig Dispense Refill Last Dose  . albuterol (PROVENTIL HFA;VENTOLIN HFA) 108 (90 BASE) MCG/ACT inhaler Inhale 2 puffs into the lungs every 6 (six) hours as needed for wheezing or shortness of breath.    Past Week at Unknown time  . budesonide-formoterol (SYMBICORT) 160-4.5 MCG/ACT inhaler Inhale 2 puffs into the lungs 2 (two) times daily.   07/23/2016 at Unknown time  . cyclobenzaprine (FLEXERIL) 10 MG tablet Take 10 mg by mouth at bedtime.   07/22/2016 at Unknown time  . guaiFENesin (MUCINEX) 600 MG 12 hr tablet Take 600-1,200 mg by mouth 4 (four) times daily.   07/22/2016 at Unknown time  . HYDROcodone-acetaminophen (NORCO/VICODIN) 5-325 MG per tablet Take 1-2 tablets by mouth every 6 (six) hours as needed for moderate pain (mostly takes at night).    07/22/2016 at Unknown time  . Melatonin 3 MG CAPS Take 3 mg by mouth at bedtime.    07/22/2016 at Unknown time  . metFORMIN (GLUCOPHAGE-XR) 500 MG 24 hr tablet Take  500 mg by mouth daily with breakfast.   07/22/2016 at Unknown time  . omeprazole (PRILOSEC) 20 MG capsule Take 1 capsule (20 mg total) by mouth daily. Take one tablet daily (Patient taking differently: Take 20 mg by mouth 2 (two) times daily. Take one tablet daily) 21 capsule 0 07/22/2016 at Unknown time  . oxyCODONE-acetaminophen (ROXICET) 5-325 MG per tablet Take 1 tablet by mouth every 4 (four) hours as needed. 30 tablet 0 Past Week at Unknown time  . Phenylephrine-Pheniramine-DM (THERAFLU COLD & COUGH) 06-09-19 MG PACK Take 1 Package by mouth daily as needed (cold symptoms).    07/22/2016 at Unknown time  . QUEtiapine (SEROQUEL) 300 MG tablet Take 300 mg by mouth at bedtime.   07/22/2016 at Unknown time  . triamcinolone (NASACORT AQ) 55 MCG/ACT AERO nasal inhaler Place 2 sprays into the nose daily. Use daily for five days (Patient taking differently: Place 1 spray into the nose daily. ) 1 Inhaler 0 Past Week at Unknown time  . Vitamin D, Ergocalciferol, (DRISDOL) 50000 UNITS CAPS capsule Take 50,000 Units by mouth every Sunday.    07/22/2016 at Unknown time  . zolpidem (AMBIEN) 10 MG tablet Take 10 mg by mouth at bedtime.    07/22/2016 at Unknown time  . doxycycline (VIBRAMYCIN) 100 MG capsule Take 1 capsule (100 mg total) by mouth 2 (two) times daily. X 7 days (  Patient not taking: Reported on 07/02/2016) 14 capsule 0 Completed Course at past week  . methylPREDNISolone (MEDROL DOSEPAK) 4 MG TBPK tablet Take by mouth as directed. (Patient not taking: Reported on 07/02/2016) 21 tablet 0 Completed Course at past week  . sucralfate (CARAFATE) 1 GM/10ML suspension Take 10 mLs (1 g total) by mouth 4 (four) times daily -  with meals and at bedtime. (Patient not taking: Reported on 07/18/2016) 420 mL 0 Not Taking at Unknown time   Allergies  Allergen Reactions  . Rocephin [Ceftriaxone] Anaphylaxis  . Morphine And Related Nausea And Vomiting  . Prednisone Itching  . Sulfa Antibiotics Itching    Social  History  Substance Use Topics  . Smoking status: Never Smoker  . Smokeless tobacco: Never Used  . Alcohol use No    History reviewed. No pertinent family history.   Objective:   Patient Vitals for the past 8 hrs:  BP Temp Temp src Resp SpO2  07/23/16 0912 (!) 147/90 98.3 F (36.8 C) Oral (!) 21 98 %   No intake/output data recorded. No intake/output data recorded.   See MD Preop evaluation      Assessment:   1. Hematemesis. Patient does have esophageal reflux is not had any recurrent G.I. bleeding.  Plan:    Will proceed with EGD for evaluation using propofol sedation. Procedure has been discussed with the patient in the office extensively.

## 2016-07-23 NOTE — Anesthesia Preprocedure Evaluation (Signed)
Anesthesia Evaluation  Patient identified by MRN, date of birth, ID band Patient awake    Reviewed: Allergy & Precautions, H&P , NPO status , Patient's Chart, lab work & pertinent test results  Airway Mallampati: II   Neck ROM: full    Dental   Pulmonary asthma , sleep apnea ,    breath sounds clear to auscultation       Cardiovascular negative cardio ROS   Rhythm:regular Rate:Normal     Neuro/Psych    GI/Hepatic   Endo/Other  diabetes, Type 2Morbid obesity  Renal/GU      Musculoskeletal  (+) Arthritis , Fibromyalgia -  Abdominal   Peds  Hematology   Anesthesia Other Findings   Reproductive/Obstetrics                             Anesthesia Physical Anesthesia Plan  ASA: III  Anesthesia Plan: MAC   Post-op Pain Management:    Induction: Intravenous  Airway Management Planned: Nasal Cannula  Additional Equipment:   Intra-op Plan:   Post-operative Plan:   Informed Consent: I have reviewed the patients History and Physical, chart, labs and discussed the procedure including the risks, benefits and alternatives for the proposed anesthesia with the patient or authorized representative who has indicated his/her understanding and acceptance.     Plan Discussed with: CRNA, Anesthesiologist and Surgeon  Anesthesia Plan Comments:         Anesthesia Quick Evaluation

## 2016-07-24 ENCOUNTER — Encounter (HOSPITAL_COMMUNITY): Payer: Self-pay | Admitting: Gastroenterology

## 2016-09-12 DIAGNOSIS — F331 Major depressive disorder, recurrent, moderate: Secondary | ICD-10-CM | POA: Diagnosis not present

## 2016-11-02 DIAGNOSIS — J4521 Mild intermittent asthma with (acute) exacerbation: Secondary | ICD-10-CM | POA: Diagnosis not present

## 2016-12-03 DIAGNOSIS — J4521 Mild intermittent asthma with (acute) exacerbation: Secondary | ICD-10-CM | POA: Diagnosis not present

## 2017-01-02 DIAGNOSIS — J4521 Mild intermittent asthma with (acute) exacerbation: Secondary | ICD-10-CM | POA: Diagnosis not present

## 2017-01-26 DIAGNOSIS — M79671 Pain in right foot: Secondary | ICD-10-CM | POA: Diagnosis not present

## 2017-01-26 DIAGNOSIS — M2142 Flat foot [pes planus] (acquired), left foot: Secondary | ICD-10-CM | POA: Diagnosis not present

## 2017-01-26 DIAGNOSIS — M79672 Pain in left foot: Secondary | ICD-10-CM | POA: Diagnosis not present

## 2017-01-26 DIAGNOSIS — M2141 Flat foot [pes planus] (acquired), right foot: Secondary | ICD-10-CM | POA: Diagnosis not present

## 2017-02-02 DIAGNOSIS — J4521 Mild intermittent asthma with (acute) exacerbation: Secondary | ICD-10-CM | POA: Diagnosis not present

## 2017-02-11 DIAGNOSIS — T753XXD Motion sickness, subsequent encounter: Secondary | ICD-10-CM | POA: Diagnosis not present

## 2017-02-11 DIAGNOSIS — J4521 Mild intermittent asthma with (acute) exacerbation: Secondary | ICD-10-CM | POA: Diagnosis not present

## 2017-02-11 DIAGNOSIS — R600 Localized edema: Secondary | ICD-10-CM | POA: Diagnosis not present

## 2017-02-11 DIAGNOSIS — I10 Essential (primary) hypertension: Secondary | ICD-10-CM | POA: Diagnosis not present

## 2017-02-11 DIAGNOSIS — M797 Fibromyalgia: Secondary | ICD-10-CM | POA: Diagnosis not present

## 2017-02-12 ENCOUNTER — Ambulatory Visit
Admission: RE | Admit: 2017-02-12 | Discharge: 2017-02-12 | Disposition: A | Discharge status: Home / Self Care | Payer: Self-pay | Source: Ambulatory Visit | Attending: Family Medicine | Admitting: Family Medicine

## 2017-02-12 ENCOUNTER — Other Ambulatory Visit: Payer: Self-pay | Admitting: Family Medicine

## 2017-02-12 DIAGNOSIS — J4521 Mild intermittent asthma with (acute) exacerbation: Secondary | ICD-10-CM

## 2017-02-12 DIAGNOSIS — J9811 Atelectasis: Secondary | ICD-10-CM | POA: Diagnosis not present

## 2017-02-27 DIAGNOSIS — F331 Major depressive disorder, recurrent, moderate: Secondary | ICD-10-CM | POA: Diagnosis not present

## 2017-03-02 DIAGNOSIS — M76822 Posterior tibial tendinitis, left leg: Secondary | ICD-10-CM | POA: Diagnosis not present

## 2017-03-02 DIAGNOSIS — M2142 Flat foot [pes planus] (acquired), left foot: Secondary | ICD-10-CM | POA: Diagnosis not present

## 2017-03-02 DIAGNOSIS — M2141 Flat foot [pes planus] (acquired), right foot: Secondary | ICD-10-CM | POA: Diagnosis not present

## 2017-03-02 DIAGNOSIS — E119 Type 2 diabetes mellitus without complications: Secondary | ICD-10-CM | POA: Diagnosis not present

## 2017-03-04 DIAGNOSIS — J4521 Mild intermittent asthma with (acute) exacerbation: Secondary | ICD-10-CM | POA: Diagnosis not present

## 2017-03-11 ENCOUNTER — Other Ambulatory Visit: Payer: Self-pay | Admitting: *Deleted

## 2017-03-11 NOTE — Patient Outreach (Signed)
[facility] Screening call, unsuccessful but left a message for a return call. If I do not hear back from the member I will try again within the week.  Waddell Iten C. Kandace Elrod, MSN, GNP-BC Gerontological Nurse Practitioner [facility] Care Management [phone]  

## 2017-03-12 ENCOUNTER — Other Ambulatory Visit: Payer: Self-pay | Admitting: *Deleted

## 2017-03-12 NOTE — Patient Outreach (Signed)
[facility] Screen. Pt again was not able to talk at this time. She asked if she could call me back and we agreed on tomorrow morning.  [name]. [name], MSN, [facility] Gerontological Nurse Practitioner [facility] Care Management [phone]

## 2017-03-13 ENCOUNTER — Encounter: Payer: Self-pay | Admitting: *Deleted

## 2017-03-13 ENCOUNTER — Other Ambulatory Visit: Payer: Self-pay | Admitting: *Deleted

## 2017-03-13 NOTE — Patient Outreach (Signed)
[facility]A [facility] Screen: Pt sees primary care MD routinely. Takes medications as ordered for chronic problems: fibromyaliga, diabetes, asthma. May be in donut hole now. She says she has been taking AMBIEN and this month it cost her a lot more. No acute health concerns. No nursing or social work needs. I will refer to pharmacy for financial assistance.  Introductory letter sent.  Virginia Luna. Virginia Neither, MSN, [facility] Gerontological Nurse Practitioner [facility] Care Management [phone]

## 2017-03-19 ENCOUNTER — Other Ambulatory Visit: Payer: Self-pay | Admitting: Family Medicine

## 2017-03-19 DIAGNOSIS — M7062 Trochanteric bursitis, left hip: Secondary | ICD-10-CM | POA: Diagnosis not present

## 2017-03-19 DIAGNOSIS — K219 Gastro-esophageal reflux disease without esophagitis: Secondary | ICD-10-CM | POA: Diagnosis not present

## 2017-03-19 DIAGNOSIS — J4520 Mild intermittent asthma, uncomplicated: Secondary | ICD-10-CM | POA: Diagnosis not present

## 2017-03-19 DIAGNOSIS — F5101 Primary insomnia: Secondary | ICD-10-CM | POA: Diagnosis not present

## 2017-03-19 DIAGNOSIS — Z7984 Long term (current) use of oral hypoglycemic drugs: Secondary | ICD-10-CM | POA: Diagnosis not present

## 2017-03-19 DIAGNOSIS — E119 Type 2 diabetes mellitus without complications: Secondary | ICD-10-CM | POA: Diagnosis not present

## 2017-03-19 DIAGNOSIS — M797 Fibromyalgia: Secondary | ICD-10-CM | POA: Diagnosis not present

## 2017-03-19 DIAGNOSIS — Z1231 Encounter for screening mammogram for malignant neoplasm of breast: Secondary | ICD-10-CM

## 2017-03-19 DIAGNOSIS — I10 Essential (primary) hypertension: Secondary | ICD-10-CM | POA: Diagnosis not present

## 2017-03-19 DIAGNOSIS — G894 Chronic pain syndrome: Secondary | ICD-10-CM | POA: Diagnosis not present

## 2017-03-19 DIAGNOSIS — R600 Localized edema: Secondary | ICD-10-CM | POA: Diagnosis not present

## 2017-03-21 DIAGNOSIS — J3081 Allergic rhinitis due to animal (cat) (dog) hair and dander: Secondary | ICD-10-CM | POA: Diagnosis not present

## 2017-03-21 DIAGNOSIS — R05 Cough: Secondary | ICD-10-CM | POA: Diagnosis not present

## 2017-03-21 DIAGNOSIS — J301 Allergic rhinitis due to pollen: Secondary | ICD-10-CM | POA: Diagnosis not present

## 2017-03-21 DIAGNOSIS — J4540 Moderate persistent asthma, uncomplicated: Secondary | ICD-10-CM | POA: Diagnosis not present

## 2017-03-27 ENCOUNTER — Other Ambulatory Visit: Payer: Self-pay | Admitting: Pharmacist

## 2017-03-27 NOTE — Patient Outreach (Signed)
[facility]) Care Management  03/27/2017  Virginia Luna 01-30-1961 [id number]  Patient was referred to [name] by [name] for patient assistance evaluation.   Unsuccessful outreach to patient, HIPAA compliant message left requesting return call.   Patient returned call and stated she was driving and would call back later.     Plan:  If no return call from patient, will make second outreach attempt next week.   [name], PharmD, [place] [phone]

## 2017-03-28 ENCOUNTER — Ambulatory Visit
Admission: RE | Admit: 2017-03-28 | Discharge: 2017-03-28 | Disposition: A | Discharge status: Home / Self Care | Payer: PPO | Source: Ambulatory Visit | Attending: Family Medicine | Admitting: Family Medicine

## 2017-03-28 DIAGNOSIS — J3081 Allergic rhinitis due to animal (cat) (dog) hair and dander: Secondary | ICD-10-CM | POA: Diagnosis not present

## 2017-03-28 DIAGNOSIS — Z1231 Encounter for screening mammogram for malignant neoplasm of breast: Secondary | ICD-10-CM

## 2017-03-28 DIAGNOSIS — J301 Allergic rhinitis due to pollen: Secondary | ICD-10-CM | POA: Diagnosis not present

## 2017-03-28 DIAGNOSIS — J3089 Other allergic rhinitis: Secondary | ICD-10-CM | POA: Diagnosis not present

## 2017-04-02 ENCOUNTER — Other Ambulatory Visit: Payer: Self-pay | Admitting: Pharmacist

## 2017-04-02 NOTE — Patient Outreach (Signed)
[facility]) Care Management  04/02/2017  Virginia Luna 03-04-1961 [id number]  Successful phone outreach to patient, HIPAA details verified.   Patient initially referred for patient medication assistance with zolpidem.  Patient reports she is in the coverage gap.  She reports she does not meet requirements for [facility] Extra Help.    Discussed with patient limited patient assistance options for generic medications, she reports she does take Breo and albuterol HFA.  Discussed with patient income requirements for [place] patient assistance program---income and out-of-pocket spend requirements.    Patient reports she will contact her pharmacy regarding her year to date out-of-pocket prescription spend.    She asked about Epi-Pen cost---discussed with patient there is a shortage of Epi-Pen at this time, per the FDA drug shortage list.  Unable to tell patient how much Epi-Pen will cost, especially in the coverage gap as she reports she is in it.    Does not appear to be manufacturer assistance for [employer] or [employer]elin [employer] for [employer] beneficiaries per [employer] and [employer].    Plan:  Patient to call [facility] Pharmacist back regarding out-of-pocket spend amount for 2018.   If no return call from patient, will make outreach attempt next week.    [name], PharmD, [place] [phone]

## 2017-04-04 DIAGNOSIS — J4521 Mild intermittent asthma with (acute) exacerbation: Secondary | ICD-10-CM | POA: Diagnosis not present

## 2017-04-08 ENCOUNTER — Other Ambulatory Visit: Payer: Self-pay | Admitting: Pharmacist

## 2017-04-08 ENCOUNTER — Encounter: Payer: Self-pay | Admitting: Pharmacist

## 2017-04-08 NOTE — Patient Outreach (Signed)
[facility]) Care Management  04/08/2017  Virginia Luna 1960/12/21 [id number]  Received message from patient she has spent >$600 out-of-pocket on prescriptions per her report.      Successful phone call back to patient.  She reports she has print-out of her year-to-date prescription drug costs.  She reports she would be interested in applying to [place] patient assistance for [facility] and albuterol inhalers to see if she is eligible for manufacturer patient assistance.  There is of course no guarantee she will be eligible.  She reports Dr Colen Darling at [facility] allergy on [address] is her prescriber.    Patient asked about epi-pen---discussed with patient there appears to be a shortage of epi-pen per FDA drug shortage list.  She asked about cost---discussed unable to determine her cost, she would need to contact her pharmacy or insurance.  Discussed appears to be no manufacturer patient assistance for epi-pen---[facility] does offer a 2 pack of epinephrine pens for $250 per its website and discussed there is another epinepherine product called Auvi-q.    Plan:  Address in chart verified as correct by patient---will have patient assistance application for [employer] patient assistance mailed to patient.   Will have [place] follow-up with patient for completion of patient assistance application, patient is aware of this.   [name], PharmD, [place] [phone]

## 2017-04-09 ENCOUNTER — Ambulatory Visit: Payer: Self-pay | Admitting: Pharmacist

## 2017-04-12 ENCOUNTER — Other Ambulatory Visit: Payer: Self-pay | Admitting: Pharmacy Technician

## 2017-04-12 NOTE — Patient Outreach (Signed)
[facility]) Care Management  04/12/2017  Virginia Luna 1960/08/24 [id number]   I contacted patient today in reference to [place] patient assistance application that I am mailing for Breo and Ventolin HFA. I spoke with the patient to inform her that the application was being mailed out today and what she would need to do to complete the application. While I was on the phone with the patient she inquired about her Epipen which she was told is on backorder and she has to have in order to get her allergy shots. I contacted [facility] [address] at the patient's request and found out they have a different generic available that is not covered by her insurance but she can get it for $59. I returned the call to the patient to make her aware and she will [facility] to have [facility] fill it for her. I also contacted [facility] office and spoke with Stanton Kidney to request hardcopy's for the prescription's that I will need to fax along with the application's to [place].  Doreene Burke, [place] [phone]

## 2017-04-24 DIAGNOSIS — J301 Allergic rhinitis due to pollen: Secondary | ICD-10-CM | POA: Diagnosis not present

## 2017-04-24 DIAGNOSIS — J3081 Allergic rhinitis due to animal (cat) (dog) hair and dander: Secondary | ICD-10-CM | POA: Diagnosis not present

## 2017-04-24 DIAGNOSIS — J3089 Other allergic rhinitis: Secondary | ICD-10-CM | POA: Diagnosis not present

## 2017-05-01 DIAGNOSIS — J3081 Allergic rhinitis due to animal (cat) (dog) hair and dander: Secondary | ICD-10-CM | POA: Diagnosis not present

## 2017-05-01 DIAGNOSIS — J301 Allergic rhinitis due to pollen: Secondary | ICD-10-CM | POA: Diagnosis not present

## 2017-05-01 DIAGNOSIS — J3089 Other allergic rhinitis: Secondary | ICD-10-CM | POA: Diagnosis not present

## 2017-05-05 DIAGNOSIS — J4521 Mild intermittent asthma with (acute) exacerbation: Secondary | ICD-10-CM | POA: Diagnosis not present

## 2017-05-08 DIAGNOSIS — J3081 Allergic rhinitis due to animal (cat) (dog) hair and dander: Secondary | ICD-10-CM | POA: Diagnosis not present

## 2017-05-08 DIAGNOSIS — J3089 Other allergic rhinitis: Secondary | ICD-10-CM | POA: Diagnosis not present

## 2017-05-08 DIAGNOSIS — J301 Allergic rhinitis due to pollen: Secondary | ICD-10-CM | POA: Diagnosis not present

## 2017-05-10 ENCOUNTER — Other Ambulatory Visit: Payer: Self-pay | Admitting: Pharmacy Technician

## 2017-05-10 NOTE — Patient Outreach (Signed)
[facility]) Care Management  05/10/2017  Virginia Luna [birth year] [id number]  I called this patient today to follow-up on an application that was mailed to her last month for Breo and Ventolin through [place] patient assistance. The patient states that she has the application but has not completed it as of yet. I inquired whether or not she needed any assistance with completing the application and went over the application again to let her know exactly what she needs to complete. The patient states that she will try to complete the application this weekend and mail it back to [facility] as soon as she does. I already have the prescription's from the provider and I just need her portion so I can fax the application to [place].  Doreene Burke, [place] [phone]

## 2017-05-15 DIAGNOSIS — J3081 Allergic rhinitis due to animal (cat) (dog) hair and dander: Secondary | ICD-10-CM | POA: Diagnosis not present

## 2017-05-15 DIAGNOSIS — J3089 Other allergic rhinitis: Secondary | ICD-10-CM | POA: Diagnosis not present

## 2017-05-15 DIAGNOSIS — J301 Allergic rhinitis due to pollen: Secondary | ICD-10-CM | POA: Diagnosis not present

## 2017-05-22 DIAGNOSIS — J3081 Allergic rhinitis due to animal (cat) (dog) hair and dander: Secondary | ICD-10-CM | POA: Diagnosis not present

## 2017-05-22 DIAGNOSIS — J3089 Other allergic rhinitis: Secondary | ICD-10-CM | POA: Diagnosis not present

## 2017-05-22 DIAGNOSIS — J301 Allergic rhinitis due to pollen: Secondary | ICD-10-CM | POA: Diagnosis not present

## 2017-05-29 DIAGNOSIS — J301 Allergic rhinitis due to pollen: Secondary | ICD-10-CM | POA: Diagnosis not present

## 2017-05-29 DIAGNOSIS — J3089 Other allergic rhinitis: Secondary | ICD-10-CM | POA: Diagnosis not present

## 2017-05-29 DIAGNOSIS — J3081 Allergic rhinitis due to animal (cat) (dog) hair and dander: Secondary | ICD-10-CM | POA: Diagnosis not present

## 2017-05-30 ENCOUNTER — Other Ambulatory Visit: Payer: Self-pay | Admitting: Pharmacy Technician

## 2017-05-30 NOTE — Patient Outreach (Signed)
[facility]) Care Management  05/30/2017  Virginia Luna 04/04/1961 [id number]  I received the patient's portion of the [place] application that was previously mailed to her for medication's Breo and Ventolin. I faxed the completed application to [place] today for patient assistance evaluation.  PLAN:  1. I will follow-up with [employer] in 3-5 business days to check on the status of patient's application. 2. I will then follow-up with the patient to let her know whether or not the application was approved.  Doreene Burke, [place] [phone]

## 2017-06-04 DIAGNOSIS — J4521 Mild intermittent asthma with (acute) exacerbation: Secondary | ICD-10-CM | POA: Diagnosis not present

## 2017-06-05 ENCOUNTER — Other Ambulatory Visit: Payer: Self-pay | Admitting: Pharmacy Technician

## 2017-06-05 DIAGNOSIS — J301 Allergic rhinitis due to pollen: Secondary | ICD-10-CM | POA: Diagnosis not present

## 2017-06-05 DIAGNOSIS — J3089 Other allergic rhinitis: Secondary | ICD-10-CM | POA: Diagnosis not present

## 2017-06-05 DIAGNOSIS — J3081 Allergic rhinitis due to animal (cat) (dog) hair and dander: Secondary | ICD-10-CM | POA: Diagnosis not present

## 2017-06-05 NOTE — Patient Outreach (Signed)
[facility]) Care Management  06/05/2017  Virginia Luna 1961-03-03 [id number]  Contacted [employer] today in reference to patient assistance application that was faxed on 05/30/2017 for Breo and Ventolin. The application is not approved as of yet because the pharmacy printout was not signed by the pharmacist. The representative states that I can possibly get it approved today if I get the signature and refax it. [facility] [address] and obtained the signature and refaxed the application.   I contacted the patient, HIPAA identifiers verified and verbal consent was received. I informed her of the current status of what I've done to get this resolved.   PLAN:  1. Contact [employer] again today to verify if fax was received and patient approved.  2. Follow-up with patient accordingly.  Doreene Burke, [place] [phone]

## 2017-06-12 DIAGNOSIS — J3081 Allergic rhinitis due to animal (cat) (dog) hair and dander: Secondary | ICD-10-CM | POA: Diagnosis not present

## 2017-06-12 DIAGNOSIS — J301 Allergic rhinitis due to pollen: Secondary | ICD-10-CM | POA: Diagnosis not present

## 2017-06-12 DIAGNOSIS — J3089 Other allergic rhinitis: Secondary | ICD-10-CM | POA: Diagnosis not present

## 2017-06-19 DIAGNOSIS — J3081 Allergic rhinitis due to animal (cat) (dog) hair and dander: Secondary | ICD-10-CM | POA: Diagnosis not present

## 2017-06-19 DIAGNOSIS — J301 Allergic rhinitis due to pollen: Secondary | ICD-10-CM | POA: Diagnosis not present

## 2017-06-19 DIAGNOSIS — J3089 Other allergic rhinitis: Secondary | ICD-10-CM | POA: Diagnosis not present

## 2017-06-24 ENCOUNTER — Other Ambulatory Visit: Payer: Self-pay | Admitting: Pharmacy Technician

## 2017-06-24 NOTE — Patient Outreach (Signed)
[place]ia Luna [facility]) Care Management  06/24/2017  [name] [birth year] [id number]   I contacted Virginia Luna today to follow-up on patient assistance. HIPAA identifiers verified and verbal consent received. Virginia Luna received her delivery of Breo and Ventolin HFA on this past Saturday. I will message Lennette Bihari, Rph to close this patient out.  Doreene Burke, [place] [phone]

## 2017-06-25 ENCOUNTER — Other Ambulatory Visit: Payer: Self-pay | Admitting: Pharmacist

## 2017-06-25 NOTE — Patient Outreach (Signed)
[facility] [facility]) Care Management  06/25/2017  Virginia Luna May 09, 1961 [id number]  Received inbasket message from [place] patient was approved for patient assistance for Breo and Ventolin from [place] through end of calendar year.   Patient pharmacy episode will be closed at this time.   Plan:  Close case.  Send patient and provider closure letters.   Message [facility] case management assistance for case closure.   [name], PharmD, [place] [phone]

## 2017-06-26 DIAGNOSIS — J301 Allergic rhinitis due to pollen: Secondary | ICD-10-CM | POA: Diagnosis not present

## 2017-06-26 DIAGNOSIS — J3081 Allergic rhinitis due to animal (cat) (dog) hair and dander: Secondary | ICD-10-CM | POA: Diagnosis not present

## 2017-06-26 DIAGNOSIS — J3089 Other allergic rhinitis: Secondary | ICD-10-CM | POA: Diagnosis not present

## 2017-07-03 DIAGNOSIS — J3081 Allergic rhinitis due to animal (cat) (dog) hair and dander: Secondary | ICD-10-CM | POA: Diagnosis not present

## 2017-07-03 DIAGNOSIS — J301 Allergic rhinitis due to pollen: Secondary | ICD-10-CM | POA: Diagnosis not present

## 2017-07-03 DIAGNOSIS — J3089 Other allergic rhinitis: Secondary | ICD-10-CM | POA: Diagnosis not present

## 2017-07-05 DIAGNOSIS — J4521 Mild intermittent asthma with (acute) exacerbation: Secondary | ICD-10-CM | POA: Diagnosis not present

## 2017-07-10 DIAGNOSIS — J3089 Other allergic rhinitis: Secondary | ICD-10-CM | POA: Diagnosis not present

## 2017-07-10 DIAGNOSIS — J301 Allergic rhinitis due to pollen: Secondary | ICD-10-CM | POA: Diagnosis not present

## 2017-07-10 DIAGNOSIS — J3081 Allergic rhinitis due to animal (cat) (dog) hair and dander: Secondary | ICD-10-CM | POA: Diagnosis not present

## 2017-07-17 DIAGNOSIS — J3089 Other allergic rhinitis: Secondary | ICD-10-CM | POA: Diagnosis not present

## 2017-07-17 DIAGNOSIS — J301 Allergic rhinitis due to pollen: Secondary | ICD-10-CM | POA: Diagnosis not present

## 2017-07-17 DIAGNOSIS — J3081 Allergic rhinitis due to animal (cat) (dog) hair and dander: Secondary | ICD-10-CM | POA: Diagnosis not present

## 2017-07-24 DIAGNOSIS — J3081 Allergic rhinitis due to animal (cat) (dog) hair and dander: Secondary | ICD-10-CM | POA: Diagnosis not present

## 2017-07-24 DIAGNOSIS — J301 Allergic rhinitis due to pollen: Secondary | ICD-10-CM | POA: Diagnosis not present

## 2017-07-24 DIAGNOSIS — J3089 Other allergic rhinitis: Secondary | ICD-10-CM | POA: Diagnosis not present

## 2017-07-31 DIAGNOSIS — J3081 Allergic rhinitis due to animal (cat) (dog) hair and dander: Secondary | ICD-10-CM | POA: Diagnosis not present

## 2017-07-31 DIAGNOSIS — J3089 Other allergic rhinitis: Secondary | ICD-10-CM | POA: Diagnosis not present

## 2017-07-31 DIAGNOSIS — J301 Allergic rhinitis due to pollen: Secondary | ICD-10-CM | POA: Diagnosis not present

## 2017-08-04 DIAGNOSIS — J4521 Mild intermittent asthma with (acute) exacerbation: Secondary | ICD-10-CM | POA: Diagnosis not present

## 2017-08-07 DIAGNOSIS — J3081 Allergic rhinitis due to animal (cat) (dog) hair and dander: Secondary | ICD-10-CM | POA: Diagnosis not present

## 2017-08-07 DIAGNOSIS — J3089 Other allergic rhinitis: Secondary | ICD-10-CM | POA: Diagnosis not present

## 2017-08-07 DIAGNOSIS — J301 Allergic rhinitis due to pollen: Secondary | ICD-10-CM | POA: Diagnosis not present

## 2017-08-15 DIAGNOSIS — F331 Major depressive disorder, recurrent, moderate: Secondary | ICD-10-CM | POA: Diagnosis not present

## 2017-08-21 DIAGNOSIS — J3081 Allergic rhinitis due to animal (cat) (dog) hair and dander: Secondary | ICD-10-CM | POA: Diagnosis not present

## 2017-08-21 DIAGNOSIS — J301 Allergic rhinitis due to pollen: Secondary | ICD-10-CM | POA: Diagnosis not present

## 2017-08-21 DIAGNOSIS — J3089 Other allergic rhinitis: Secondary | ICD-10-CM | POA: Diagnosis not present

## 2017-08-28 DIAGNOSIS — J3089 Other allergic rhinitis: Secondary | ICD-10-CM | POA: Diagnosis not present

## 2017-08-28 DIAGNOSIS — J301 Allergic rhinitis due to pollen: Secondary | ICD-10-CM | POA: Diagnosis not present

## 2017-08-28 DIAGNOSIS — J3081 Allergic rhinitis due to animal (cat) (dog) hair and dander: Secondary | ICD-10-CM | POA: Diagnosis not present

## 2017-09-04 DIAGNOSIS — J3081 Allergic rhinitis due to animal (cat) (dog) hair and dander: Secondary | ICD-10-CM | POA: Diagnosis not present

## 2017-09-04 DIAGNOSIS — J301 Allergic rhinitis due to pollen: Secondary | ICD-10-CM | POA: Diagnosis not present

## 2017-09-04 DIAGNOSIS — J4521 Mild intermittent asthma with (acute) exacerbation: Secondary | ICD-10-CM | POA: Diagnosis not present

## 2017-09-04 DIAGNOSIS — J3089 Other allergic rhinitis: Secondary | ICD-10-CM | POA: Diagnosis not present

## 2017-09-11 DIAGNOSIS — J3089 Other allergic rhinitis: Secondary | ICD-10-CM | POA: Diagnosis not present

## 2017-09-11 DIAGNOSIS — J3081 Allergic rhinitis due to animal (cat) (dog) hair and dander: Secondary | ICD-10-CM | POA: Diagnosis not present

## 2017-09-11 DIAGNOSIS — J301 Allergic rhinitis due to pollen: Secondary | ICD-10-CM | POA: Diagnosis not present

## 2017-09-18 DIAGNOSIS — E119 Type 2 diabetes mellitus without complications: Secondary | ICD-10-CM | POA: Diagnosis not present

## 2017-09-18 DIAGNOSIS — K219 Gastro-esophageal reflux disease without esophagitis: Secondary | ICD-10-CM | POA: Diagnosis not present

## 2017-09-18 DIAGNOSIS — M7061 Trochanteric bursitis, right hip: Secondary | ICD-10-CM | POA: Diagnosis not present

## 2017-09-18 DIAGNOSIS — G43909 Migraine, unspecified, not intractable, without status migrainosus: Secondary | ICD-10-CM | POA: Diagnosis not present

## 2017-09-18 DIAGNOSIS — J0101 Acute recurrent maxillary sinusitis: Secondary | ICD-10-CM | POA: Diagnosis not present

## 2017-09-18 DIAGNOSIS — G894 Chronic pain syndrome: Secondary | ICD-10-CM | POA: Diagnosis not present

## 2017-09-18 DIAGNOSIS — Z7984 Long term (current) use of oral hypoglycemic drugs: Secondary | ICD-10-CM | POA: Diagnosis not present

## 2017-09-18 DIAGNOSIS — I10 Essential (primary) hypertension: Secondary | ICD-10-CM | POA: Diagnosis not present

## 2017-09-18 DIAGNOSIS — M797 Fibromyalgia: Secondary | ICD-10-CM | POA: Diagnosis not present

## 2017-09-18 DIAGNOSIS — J3081 Allergic rhinitis due to animal (cat) (dog) hair and dander: Secondary | ICD-10-CM | POA: Diagnosis not present

## 2017-09-18 DIAGNOSIS — D649 Anemia, unspecified: Secondary | ICD-10-CM | POA: Diagnosis not present

## 2017-09-18 DIAGNOSIS — J3089 Other allergic rhinitis: Secondary | ICD-10-CM | POA: Diagnosis not present

## 2017-09-18 DIAGNOSIS — Z6841 Body Mass Index (BMI) 40.0 and over, adult: Secondary | ICD-10-CM | POA: Diagnosis not present

## 2017-09-18 DIAGNOSIS — J301 Allergic rhinitis due to pollen: Secondary | ICD-10-CM | POA: Diagnosis not present

## 2017-09-25 DIAGNOSIS — J3089 Other allergic rhinitis: Secondary | ICD-10-CM | POA: Diagnosis not present

## 2017-09-25 DIAGNOSIS — J301 Allergic rhinitis due to pollen: Secondary | ICD-10-CM | POA: Diagnosis not present

## 2017-09-25 DIAGNOSIS — J3081 Allergic rhinitis due to animal (cat) (dog) hair and dander: Secondary | ICD-10-CM | POA: Diagnosis not present

## 2017-10-02 DIAGNOSIS — J3089 Other allergic rhinitis: Secondary | ICD-10-CM | POA: Diagnosis not present

## 2017-10-02 DIAGNOSIS — J301 Allergic rhinitis due to pollen: Secondary | ICD-10-CM | POA: Diagnosis not present

## 2017-10-02 DIAGNOSIS — J3081 Allergic rhinitis due to animal (cat) (dog) hair and dander: Secondary | ICD-10-CM | POA: Diagnosis not present

## 2017-10-05 DIAGNOSIS — J4521 Mild intermittent asthma with (acute) exacerbation: Secondary | ICD-10-CM | POA: Diagnosis not present

## 2017-10-09 DIAGNOSIS — J3081 Allergic rhinitis due to animal (cat) (dog) hair and dander: Secondary | ICD-10-CM | POA: Diagnosis not present

## 2017-10-09 DIAGNOSIS — J301 Allergic rhinitis due to pollen: Secondary | ICD-10-CM | POA: Diagnosis not present

## 2017-10-09 DIAGNOSIS — J3089 Other allergic rhinitis: Secondary | ICD-10-CM | POA: Diagnosis not present

## 2017-10-16 DIAGNOSIS — J3081 Allergic rhinitis due to animal (cat) (dog) hair and dander: Secondary | ICD-10-CM | POA: Diagnosis not present

## 2017-10-16 DIAGNOSIS — J301 Allergic rhinitis due to pollen: Secondary | ICD-10-CM | POA: Diagnosis not present

## 2017-10-16 DIAGNOSIS — J3089 Other allergic rhinitis: Secondary | ICD-10-CM | POA: Diagnosis not present

## 2017-10-23 DIAGNOSIS — J301 Allergic rhinitis due to pollen: Secondary | ICD-10-CM | POA: Diagnosis not present

## 2017-10-23 DIAGNOSIS — J3089 Other allergic rhinitis: Secondary | ICD-10-CM | POA: Diagnosis not present

## 2017-10-30 DIAGNOSIS — J3089 Other allergic rhinitis: Secondary | ICD-10-CM | POA: Diagnosis not present

## 2017-10-30 DIAGNOSIS — J3081 Allergic rhinitis due to animal (cat) (dog) hair and dander: Secondary | ICD-10-CM | POA: Diagnosis not present

## 2017-10-30 DIAGNOSIS — J301 Allergic rhinitis due to pollen: Secondary | ICD-10-CM | POA: Diagnosis not present

## 2017-11-06 DIAGNOSIS — J3081 Allergic rhinitis due to animal (cat) (dog) hair and dander: Secondary | ICD-10-CM | POA: Diagnosis not present

## 2017-11-06 DIAGNOSIS — J3089 Other allergic rhinitis: Secondary | ICD-10-CM | POA: Diagnosis not present

## 2017-11-06 DIAGNOSIS — J301 Allergic rhinitis due to pollen: Secondary | ICD-10-CM | POA: Diagnosis not present

## 2017-11-11 DIAGNOSIS — J3081 Allergic rhinitis due to animal (cat) (dog) hair and dander: Secondary | ICD-10-CM | POA: Diagnosis not present

## 2017-11-11 DIAGNOSIS — J4540 Moderate persistent asthma, uncomplicated: Secondary | ICD-10-CM | POA: Diagnosis not present

## 2017-11-11 DIAGNOSIS — J3089 Other allergic rhinitis: Secondary | ICD-10-CM | POA: Diagnosis not present

## 2017-11-11 DIAGNOSIS — J301 Allergic rhinitis due to pollen: Secondary | ICD-10-CM | POA: Diagnosis not present

## 2017-11-20 DIAGNOSIS — J3081 Allergic rhinitis due to animal (cat) (dog) hair and dander: Secondary | ICD-10-CM | POA: Diagnosis not present

## 2017-11-20 DIAGNOSIS — J3089 Other allergic rhinitis: Secondary | ICD-10-CM | POA: Diagnosis not present

## 2017-11-20 DIAGNOSIS — J301 Allergic rhinitis due to pollen: Secondary | ICD-10-CM | POA: Diagnosis not present

## 2017-11-27 DIAGNOSIS — J301 Allergic rhinitis due to pollen: Secondary | ICD-10-CM | POA: Diagnosis not present

## 2017-11-27 DIAGNOSIS — J3081 Allergic rhinitis due to animal (cat) (dog) hair and dander: Secondary | ICD-10-CM | POA: Diagnosis not present

## 2017-11-27 DIAGNOSIS — J3089 Other allergic rhinitis: Secondary | ICD-10-CM | POA: Diagnosis not present

## 2017-12-04 DIAGNOSIS — J3089 Other allergic rhinitis: Secondary | ICD-10-CM | POA: Diagnosis not present

## 2017-12-04 DIAGNOSIS — J301 Allergic rhinitis due to pollen: Secondary | ICD-10-CM | POA: Diagnosis not present

## 2017-12-04 DIAGNOSIS — J3081 Allergic rhinitis due to animal (cat) (dog) hair and dander: Secondary | ICD-10-CM | POA: Diagnosis not present

## 2017-12-11 DIAGNOSIS — J3081 Allergic rhinitis due to animal (cat) (dog) hair and dander: Secondary | ICD-10-CM | POA: Diagnosis not present

## 2017-12-11 DIAGNOSIS — J301 Allergic rhinitis due to pollen: Secondary | ICD-10-CM | POA: Diagnosis not present

## 2017-12-11 DIAGNOSIS — J3089 Other allergic rhinitis: Secondary | ICD-10-CM | POA: Diagnosis not present

## 2017-12-18 DIAGNOSIS — J3089 Other allergic rhinitis: Secondary | ICD-10-CM | POA: Diagnosis not present

## 2017-12-18 DIAGNOSIS — J301 Allergic rhinitis due to pollen: Secondary | ICD-10-CM | POA: Diagnosis not present

## 2017-12-18 DIAGNOSIS — J3081 Allergic rhinitis due to animal (cat) (dog) hair and dander: Secondary | ICD-10-CM | POA: Diagnosis not present

## 2017-12-25 DIAGNOSIS — J301 Allergic rhinitis due to pollen: Secondary | ICD-10-CM | POA: Diagnosis not present

## 2017-12-25 DIAGNOSIS — J3081 Allergic rhinitis due to animal (cat) (dog) hair and dander: Secondary | ICD-10-CM | POA: Diagnosis not present

## 2017-12-25 DIAGNOSIS — J3089 Other allergic rhinitis: Secondary | ICD-10-CM | POA: Diagnosis not present

## 2018-01-01 DIAGNOSIS — J301 Allergic rhinitis due to pollen: Secondary | ICD-10-CM | POA: Diagnosis not present

## 2018-01-01 DIAGNOSIS — J3081 Allergic rhinitis due to animal (cat) (dog) hair and dander: Secondary | ICD-10-CM | POA: Diagnosis not present

## 2018-01-01 DIAGNOSIS — J3089 Other allergic rhinitis: Secondary | ICD-10-CM | POA: Diagnosis not present

## 2018-01-08 DIAGNOSIS — J301 Allergic rhinitis due to pollen: Secondary | ICD-10-CM | POA: Diagnosis not present

## 2018-01-08 DIAGNOSIS — J3081 Allergic rhinitis due to animal (cat) (dog) hair and dander: Secondary | ICD-10-CM | POA: Diagnosis not present

## 2018-01-08 DIAGNOSIS — J3089 Other allergic rhinitis: Secondary | ICD-10-CM | POA: Diagnosis not present

## 2018-01-15 DIAGNOSIS — J3081 Allergic rhinitis due to animal (cat) (dog) hair and dander: Secondary | ICD-10-CM | POA: Diagnosis not present

## 2018-01-15 DIAGNOSIS — J3089 Other allergic rhinitis: Secondary | ICD-10-CM | POA: Diagnosis not present

## 2018-01-15 DIAGNOSIS — J301 Allergic rhinitis due to pollen: Secondary | ICD-10-CM | POA: Diagnosis not present

## 2018-01-16 DIAGNOSIS — M797 Fibromyalgia: Secondary | ICD-10-CM | POA: Diagnosis not present

## 2018-01-16 DIAGNOSIS — E119 Type 2 diabetes mellitus without complications: Secondary | ICD-10-CM | POA: Diagnosis not present

## 2018-01-16 DIAGNOSIS — R600 Localized edema: Secondary | ICD-10-CM | POA: Diagnosis not present

## 2018-01-16 DIAGNOSIS — J4520 Mild intermittent asthma, uncomplicated: Secondary | ICD-10-CM | POA: Diagnosis not present

## 2018-01-16 DIAGNOSIS — D649 Anemia, unspecified: Secondary | ICD-10-CM | POA: Diagnosis not present

## 2018-01-16 DIAGNOSIS — J40 Bronchitis, not specified as acute or chronic: Secondary | ICD-10-CM | POA: Diagnosis not present

## 2018-01-16 DIAGNOSIS — E7800 Pure hypercholesterolemia, unspecified: Secondary | ICD-10-CM | POA: Diagnosis not present

## 2018-01-16 DIAGNOSIS — G894 Chronic pain syndrome: Secondary | ICD-10-CM | POA: Diagnosis not present

## 2018-01-16 DIAGNOSIS — K219 Gastro-esophageal reflux disease without esophagitis: Secondary | ICD-10-CM | POA: Diagnosis not present

## 2018-01-16 DIAGNOSIS — I10 Essential (primary) hypertension: Secondary | ICD-10-CM | POA: Diagnosis not present

## 2018-01-22 DIAGNOSIS — J301 Allergic rhinitis due to pollen: Secondary | ICD-10-CM | POA: Diagnosis not present

## 2018-01-22 DIAGNOSIS — J3081 Allergic rhinitis due to animal (cat) (dog) hair and dander: Secondary | ICD-10-CM | POA: Diagnosis not present

## 2018-01-22 DIAGNOSIS — J3089 Other allergic rhinitis: Secondary | ICD-10-CM | POA: Diagnosis not present

## 2018-01-29 DIAGNOSIS — J3081 Allergic rhinitis due to animal (cat) (dog) hair and dander: Secondary | ICD-10-CM | POA: Diagnosis not present

## 2018-01-29 DIAGNOSIS — J3089 Other allergic rhinitis: Secondary | ICD-10-CM | POA: Diagnosis not present

## 2018-01-29 DIAGNOSIS — J301 Allergic rhinitis due to pollen: Secondary | ICD-10-CM | POA: Diagnosis not present

## 2018-02-05 DIAGNOSIS — J301 Allergic rhinitis due to pollen: Secondary | ICD-10-CM | POA: Diagnosis not present

## 2018-02-05 DIAGNOSIS — J3081 Allergic rhinitis due to animal (cat) (dog) hair and dander: Secondary | ICD-10-CM | POA: Diagnosis not present

## 2018-02-05 DIAGNOSIS — J3089 Other allergic rhinitis: Secondary | ICD-10-CM | POA: Diagnosis not present

## 2018-02-12 DIAGNOSIS — J3081 Allergic rhinitis due to animal (cat) (dog) hair and dander: Secondary | ICD-10-CM | POA: Diagnosis not present

## 2018-02-12 DIAGNOSIS — J3089 Other allergic rhinitis: Secondary | ICD-10-CM | POA: Diagnosis not present

## 2018-02-12 DIAGNOSIS — J301 Allergic rhinitis due to pollen: Secondary | ICD-10-CM | POA: Diagnosis not present

## 2018-02-19 DIAGNOSIS — J301 Allergic rhinitis due to pollen: Secondary | ICD-10-CM | POA: Diagnosis not present

## 2018-02-19 DIAGNOSIS — J3089 Other allergic rhinitis: Secondary | ICD-10-CM | POA: Diagnosis not present

## 2018-02-19 DIAGNOSIS — J3081 Allergic rhinitis due to animal (cat) (dog) hair and dander: Secondary | ICD-10-CM | POA: Diagnosis not present

## 2018-02-26 DIAGNOSIS — J3081 Allergic rhinitis due to animal (cat) (dog) hair and dander: Secondary | ICD-10-CM | POA: Diagnosis not present

## 2018-02-26 DIAGNOSIS — J3089 Other allergic rhinitis: Secondary | ICD-10-CM | POA: Diagnosis not present

## 2018-02-26 DIAGNOSIS — J301 Allergic rhinitis due to pollen: Secondary | ICD-10-CM | POA: Diagnosis not present

## 2018-03-05 DIAGNOSIS — J3081 Allergic rhinitis due to animal (cat) (dog) hair and dander: Secondary | ICD-10-CM | POA: Diagnosis not present

## 2018-03-05 DIAGNOSIS — J3089 Other allergic rhinitis: Secondary | ICD-10-CM | POA: Diagnosis not present

## 2018-03-05 DIAGNOSIS — J301 Allergic rhinitis due to pollen: Secondary | ICD-10-CM | POA: Diagnosis not present

## 2018-03-12 DIAGNOSIS — J3089 Other allergic rhinitis: Secondary | ICD-10-CM | POA: Diagnosis not present

## 2018-03-12 DIAGNOSIS — J301 Allergic rhinitis due to pollen: Secondary | ICD-10-CM | POA: Diagnosis not present

## 2018-03-12 DIAGNOSIS — J3081 Allergic rhinitis due to animal (cat) (dog) hair and dander: Secondary | ICD-10-CM | POA: Diagnosis not present

## 2018-03-19 DIAGNOSIS — J3089 Other allergic rhinitis: Secondary | ICD-10-CM | POA: Diagnosis not present

## 2018-03-19 DIAGNOSIS — J301 Allergic rhinitis due to pollen: Secondary | ICD-10-CM | POA: Diagnosis not present

## 2018-03-19 DIAGNOSIS — J3081 Allergic rhinitis due to animal (cat) (dog) hair and dander: Secondary | ICD-10-CM | POA: Diagnosis not present

## 2018-03-26 DIAGNOSIS — J301 Allergic rhinitis due to pollen: Secondary | ICD-10-CM | POA: Diagnosis not present

## 2018-03-26 DIAGNOSIS — J3089 Other allergic rhinitis: Secondary | ICD-10-CM | POA: Diagnosis not present

## 2018-03-26 DIAGNOSIS — J3081 Allergic rhinitis due to animal (cat) (dog) hair and dander: Secondary | ICD-10-CM | POA: Diagnosis not present

## 2018-04-02 DIAGNOSIS — J301 Allergic rhinitis due to pollen: Secondary | ICD-10-CM | POA: Diagnosis not present

## 2018-04-02 DIAGNOSIS — J3089 Other allergic rhinitis: Secondary | ICD-10-CM | POA: Diagnosis not present

## 2018-04-02 DIAGNOSIS — J3081 Allergic rhinitis due to animal (cat) (dog) hair and dander: Secondary | ICD-10-CM | POA: Diagnosis not present

## 2018-04-09 DIAGNOSIS — J3089 Other allergic rhinitis: Secondary | ICD-10-CM | POA: Diagnosis not present

## 2018-04-09 DIAGNOSIS — J3081 Allergic rhinitis due to animal (cat) (dog) hair and dander: Secondary | ICD-10-CM | POA: Diagnosis not present

## 2018-04-09 DIAGNOSIS — J301 Allergic rhinitis due to pollen: Secondary | ICD-10-CM | POA: Diagnosis not present

## 2018-04-16 DIAGNOSIS — J3081 Allergic rhinitis due to animal (cat) (dog) hair and dander: Secondary | ICD-10-CM | POA: Diagnosis not present

## 2018-04-16 DIAGNOSIS — J3089 Other allergic rhinitis: Secondary | ICD-10-CM | POA: Diagnosis not present

## 2018-04-16 DIAGNOSIS — J301 Allergic rhinitis due to pollen: Secondary | ICD-10-CM | POA: Diagnosis not present

## 2018-04-22 DIAGNOSIS — E119 Type 2 diabetes mellitus without complications: Secondary | ICD-10-CM | POA: Diagnosis not present

## 2018-04-22 DIAGNOSIS — E7800 Pure hypercholesterolemia, unspecified: Secondary | ICD-10-CM | POA: Diagnosis not present

## 2018-04-22 DIAGNOSIS — J301 Allergic rhinitis due to pollen: Secondary | ICD-10-CM | POA: Diagnosis not present

## 2018-04-22 DIAGNOSIS — G894 Chronic pain syndrome: Secondary | ICD-10-CM | POA: Diagnosis not present

## 2018-04-22 DIAGNOSIS — D649 Anemia, unspecified: Secondary | ICD-10-CM | POA: Diagnosis not present

## 2018-04-22 DIAGNOSIS — J3081 Allergic rhinitis due to animal (cat) (dog) hair and dander: Secondary | ICD-10-CM | POA: Diagnosis not present

## 2018-04-22 DIAGNOSIS — K219 Gastro-esophageal reflux disease without esophagitis: Secondary | ICD-10-CM | POA: Diagnosis not present

## 2018-04-22 DIAGNOSIS — F5101 Primary insomnia: Secondary | ICD-10-CM | POA: Diagnosis not present

## 2018-04-22 DIAGNOSIS — I10 Essential (primary) hypertension: Secondary | ICD-10-CM | POA: Diagnosis not present

## 2018-04-22 DIAGNOSIS — M797 Fibromyalgia: Secondary | ICD-10-CM | POA: Diagnosis not present

## 2018-04-22 DIAGNOSIS — Z23 Encounter for immunization: Secondary | ICD-10-CM | POA: Diagnosis not present

## 2018-04-22 DIAGNOSIS — G43909 Migraine, unspecified, not intractable, without status migrainosus: Secondary | ICD-10-CM | POA: Diagnosis not present

## 2018-04-22 DIAGNOSIS — J3089 Other allergic rhinitis: Secondary | ICD-10-CM | POA: Diagnosis not present

## 2018-04-23 DIAGNOSIS — J3089 Other allergic rhinitis: Secondary | ICD-10-CM | POA: Diagnosis not present

## 2018-04-23 DIAGNOSIS — J301 Allergic rhinitis due to pollen: Secondary | ICD-10-CM | POA: Diagnosis not present

## 2018-04-23 DIAGNOSIS — J3081 Allergic rhinitis due to animal (cat) (dog) hair and dander: Secondary | ICD-10-CM | POA: Diagnosis not present

## 2018-04-24 DIAGNOSIS — F331 Major depressive disorder, recurrent, moderate: Secondary | ICD-10-CM | POA: Diagnosis not present

## 2018-04-30 DIAGNOSIS — J3089 Other allergic rhinitis: Secondary | ICD-10-CM | POA: Diagnosis not present

## 2018-04-30 DIAGNOSIS — J301 Allergic rhinitis due to pollen: Secondary | ICD-10-CM | POA: Diagnosis not present

## 2018-04-30 DIAGNOSIS — J3081 Allergic rhinitis due to animal (cat) (dog) hair and dander: Secondary | ICD-10-CM | POA: Diagnosis not present

## 2018-05-07 DIAGNOSIS — J301 Allergic rhinitis due to pollen: Secondary | ICD-10-CM | POA: Diagnosis not present

## 2018-05-07 DIAGNOSIS — J3081 Allergic rhinitis due to animal (cat) (dog) hair and dander: Secondary | ICD-10-CM | POA: Diagnosis not present

## 2018-05-07 DIAGNOSIS — J3089 Other allergic rhinitis: Secondary | ICD-10-CM | POA: Diagnosis not present

## 2018-05-14 ENCOUNTER — Other Ambulatory Visit: Payer: Self-pay | Admitting: Family Medicine

## 2018-05-14 DIAGNOSIS — Z1231 Encounter for screening mammogram for malignant neoplasm of breast: Secondary | ICD-10-CM

## 2018-05-14 DIAGNOSIS — J3089 Other allergic rhinitis: Secondary | ICD-10-CM | POA: Diagnosis not present

## 2018-05-14 DIAGNOSIS — J3081 Allergic rhinitis due to animal (cat) (dog) hair and dander: Secondary | ICD-10-CM | POA: Diagnosis not present

## 2018-05-14 DIAGNOSIS — J301 Allergic rhinitis due to pollen: Secondary | ICD-10-CM | POA: Diagnosis not present

## 2018-05-15 ENCOUNTER — Other Ambulatory Visit: Payer: Self-pay | Admitting: Gastroenterology

## 2018-05-15 DIAGNOSIS — Z6841 Body Mass Index (BMI) 40.0 and over, adult: Secondary | ICD-10-CM | POA: Diagnosis not present

## 2018-05-15 DIAGNOSIS — K219 Gastro-esophageal reflux disease without esophagitis: Secondary | ICD-10-CM | POA: Diagnosis not present

## 2018-05-15 DIAGNOSIS — K920 Hematemesis: Secondary | ICD-10-CM | POA: Diagnosis not present

## 2018-05-15 DIAGNOSIS — E119 Type 2 diabetes mellitus without complications: Secondary | ICD-10-CM | POA: Diagnosis not present

## 2018-05-15 DIAGNOSIS — R1013 Epigastric pain: Secondary | ICD-10-CM | POA: Diagnosis not present

## 2018-05-15 DIAGNOSIS — M797 Fibromyalgia: Secondary | ICD-10-CM | POA: Diagnosis not present

## 2018-05-15 DIAGNOSIS — Z8719 Personal history of other diseases of the digestive system: Secondary | ICD-10-CM | POA: Diagnosis not present

## 2018-05-15 DIAGNOSIS — G894 Chronic pain syndrome: Secondary | ICD-10-CM | POA: Diagnosis not present

## 2018-05-16 ENCOUNTER — Ambulatory Visit
Admission: RE | Admit: 2018-05-16 | Discharge: 2018-05-16 | Disposition: A | Discharge status: Home / Self Care | Payer: PPO | Source: Ambulatory Visit | Attending: Family Medicine | Admitting: Family Medicine

## 2018-05-16 DIAGNOSIS — Z1231 Encounter for screening mammogram for malignant neoplasm of breast: Secondary | ICD-10-CM

## 2018-05-21 DIAGNOSIS — J3089 Other allergic rhinitis: Secondary | ICD-10-CM | POA: Diagnosis not present

## 2018-05-21 DIAGNOSIS — J301 Allergic rhinitis due to pollen: Secondary | ICD-10-CM | POA: Diagnosis not present

## 2018-05-21 DIAGNOSIS — J3081 Allergic rhinitis due to animal (cat) (dog) hair and dander: Secondary | ICD-10-CM | POA: Diagnosis not present

## 2018-05-28 DIAGNOSIS — J3081 Allergic rhinitis due to animal (cat) (dog) hair and dander: Secondary | ICD-10-CM | POA: Diagnosis not present

## 2018-05-28 DIAGNOSIS — J3089 Other allergic rhinitis: Secondary | ICD-10-CM | POA: Diagnosis not present

## 2018-05-28 DIAGNOSIS — J301 Allergic rhinitis due to pollen: Secondary | ICD-10-CM | POA: Diagnosis not present

## 2018-06-04 DIAGNOSIS — J3089 Other allergic rhinitis: Secondary | ICD-10-CM | POA: Diagnosis not present

## 2018-06-04 DIAGNOSIS — J3081 Allergic rhinitis due to animal (cat) (dog) hair and dander: Secondary | ICD-10-CM | POA: Diagnosis not present

## 2018-06-04 DIAGNOSIS — J301 Allergic rhinitis due to pollen: Secondary | ICD-10-CM | POA: Diagnosis not present

## 2018-06-10 ENCOUNTER — Encounter (HOSPITAL_COMMUNITY): Payer: Self-pay | Admitting: *Deleted

## 2018-06-11 ENCOUNTER — Other Ambulatory Visit: Payer: Self-pay

## 2018-06-11 ENCOUNTER — Ambulatory Visit (HOSPITAL_COMMUNITY)
Admission: RE | Admit: 2018-06-11 | Discharge: 2018-06-11 | Disposition: A | Discharge status: Home / Self Care | Payer: PPO | Source: Ambulatory Visit | Attending: Gastroenterology | Admitting: Gastroenterology

## 2018-06-11 ENCOUNTER — Ambulatory Visit (HOSPITAL_COMMUNITY): Payer: PPO | Admitting: Anesthesiology

## 2018-06-11 ENCOUNTER — Encounter (HOSPITAL_COMMUNITY)
Admission: RE | Disposition: A | Discharge status: Home / Self Care | Payer: Self-pay | Source: Ambulatory Visit | Attending: Gastroenterology

## 2018-06-11 ENCOUNTER — Encounter (HOSPITAL_COMMUNITY): Payer: Self-pay | Admitting: *Deleted

## 2018-06-11 DIAGNOSIS — Z882 Allergy status to sulfonamides status: Secondary | ICD-10-CM | POA: Insufficient documentation

## 2018-06-11 DIAGNOSIS — Z885 Allergy status to narcotic agent status: Secondary | ICD-10-CM | POA: Diagnosis not present

## 2018-06-11 DIAGNOSIS — Z7984 Long term (current) use of oral hypoglycemic drugs: Secondary | ICD-10-CM | POA: Diagnosis not present

## 2018-06-11 DIAGNOSIS — K449 Diaphragmatic hernia without obstruction or gangrene: Secondary | ICD-10-CM | POA: Diagnosis not present

## 2018-06-11 DIAGNOSIS — M1990 Unspecified osteoarthritis, unspecified site: Secondary | ICD-10-CM | POA: Insufficient documentation

## 2018-06-11 DIAGNOSIS — G4730 Sleep apnea, unspecified: Secondary | ICD-10-CM | POA: Insufficient documentation

## 2018-06-11 DIAGNOSIS — J3089 Other allergic rhinitis: Secondary | ICD-10-CM | POA: Diagnosis not present

## 2018-06-11 DIAGNOSIS — E119 Type 2 diabetes mellitus without complications: Secondary | ICD-10-CM | POA: Diagnosis not present

## 2018-06-11 DIAGNOSIS — K219 Gastro-esophageal reflux disease without esophagitis: Secondary | ICD-10-CM | POA: Insufficient documentation

## 2018-06-11 DIAGNOSIS — Z6841 Body Mass Index (BMI) 40.0 and over, adult: Secondary | ICD-10-CM | POA: Diagnosis not present

## 2018-06-11 DIAGNOSIS — Z8719 Personal history of other diseases of the digestive system: Secondary | ICD-10-CM | POA: Diagnosis not present

## 2018-06-11 DIAGNOSIS — Z8711 Personal history of peptic ulcer disease: Secondary | ICD-10-CM | POA: Insufficient documentation

## 2018-06-11 DIAGNOSIS — K222 Esophageal obstruction: Secondary | ICD-10-CM | POA: Insufficient documentation

## 2018-06-11 DIAGNOSIS — Z79899 Other long term (current) drug therapy: Secondary | ICD-10-CM | POA: Diagnosis not present

## 2018-06-11 DIAGNOSIS — J3081 Allergic rhinitis due to animal (cat) (dog) hair and dander: Secondary | ICD-10-CM | POA: Diagnosis not present

## 2018-06-11 DIAGNOSIS — K920 Hematemesis: Secondary | ICD-10-CM | POA: Insufficient documentation

## 2018-06-11 DIAGNOSIS — M797 Fibromyalgia: Secondary | ICD-10-CM | POA: Insufficient documentation

## 2018-06-11 DIAGNOSIS — J301 Allergic rhinitis due to pollen: Secondary | ICD-10-CM | POA: Diagnosis not present

## 2018-06-11 DIAGNOSIS — K921 Melena: Secondary | ICD-10-CM | POA: Diagnosis not present

## 2018-06-11 DIAGNOSIS — J45909 Unspecified asthma, uncomplicated: Secondary | ICD-10-CM | POA: Insufficient documentation

## 2018-06-11 DIAGNOSIS — Z881 Allergy status to other antibiotic agents status: Secondary | ICD-10-CM | POA: Diagnosis not present

## 2018-06-11 HISTORY — PX: ESOPHAGOGASTRODUODENOSCOPY (EGD) WITH PROPOFOL: SHX5813

## 2018-06-11 LAB — GLUCOSE, CAPILLARY: GLUCOSE-CAPILLARY: 113 mg/dL — AB (ref 70–99)

## 2018-06-11 SURGERY — ESOPHAGOGASTRODUODENOSCOPY (EGD) WITH PROPOFOL
Anesthesia: Monitor Anesthesia Care

## 2018-06-11 MED ORDER — PROPOFOL 10 MG/ML IV BOLUS
INTRAVENOUS | Status: DC | PRN
  Administered 2018-06-11: 40 mg via INTRAVENOUS
  Administered 2018-06-11: 20 mg via INTRAVENOUS
  Administered 2018-06-11: 40 mg via INTRAVENOUS
  Administered 2018-06-11 (×2): 20 mg via INTRAVENOUS

## 2018-06-11 MED ORDER — PROPOFOL 10 MG/ML IV BOLUS
INTRAVENOUS | Status: AC
  Filled 2018-06-11: qty 40

## 2018-06-11 MED ORDER — SODIUM CHLORIDE 0.9 % IV SOLN: INTRAVENOUS | Status: DC

## 2018-06-11 MED ORDER — LACTATED RINGERS IV SOLN
INTRAVENOUS | Status: DC
  Administered 2018-06-11: 1000 mL via INTRAVENOUS

## 2018-06-11 MED ORDER — MIDAZOLAM HCL 5 MG/5ML IJ SOLN
INTRAMUSCULAR | Status: DC | PRN
  Administered 2018-06-11 (×2): 1 mg via INTRAVENOUS

## 2018-06-11 MED ORDER — LIDOCAINE HCL 1 % IJ SOLN
INTRAMUSCULAR | Status: DC | PRN
  Administered 2018-06-11: 30 mg via INTRADERMAL

## 2018-06-11 MED ORDER — MIDAZOLAM HCL 2 MG/2ML IJ SOLN
INTRAMUSCULAR | Status: AC
  Filled 2018-06-11: qty 2

## 2018-06-11 SURGICAL SUPPLY — 15 items

## 2018-06-11 NOTE — Op Note (Signed)
[facility] Patient Name: Virginia Luna Procedure Date: 06/11/2018 MRN: [id number] Attending MD: Nancy Fetter Dr., MD Date of Birth: 1961-01-30 CSN: [id number] Age: 57 Admit Type: Outpatient Procedure:                Upper GI endoscopy Indications:              Epigastric abdominal pain, For therapy of                            esophageal reflux, Hematemesis Providers:                Jeneen Rinks L. Tonnette Zwiebel Dr., MD, Carlyn Reichert, RN, Charolette Child, Technician, Karis Juba, CRNA Referring MD:              Medicines:                 Complications:            No immediate complications. Estimated Blood Loss:     Estimated blood loss: none. Procedure:                Pre-Anesthesia Assessment:                           - Prior to the procedure, a History and Physical                            was performed, and patient medications and                            allergies were reviewed. The patient's tolerance of                            previous anesthesia was also reviewed. The risks                            and benefits of the procedure and the sedation                            options and risks were discussed with the patient.                            All questions were answered, and informed consent                            was obtained. Prior Anticoagulants: The patient has                            taken no previous anticoagulant or antiplatelet                            agents. ASA Grade Assessment: III - A patient with  severe systemic disease. After reviewing the risks                            and benefits, the patient was deemed in                            satisfactory condition to undergo the procedure.                           After obtaining informed consent, the endoscope was                            passed under direct vision. Throughout the                            procedure, the  patient's blood pressure, pulse, and                            oxygen saturations were monitored continuously. The                            GIF-H190 ([id number]) Olympus adult endoscope was                            introduced through the mouth, and advanced to the                            second part of duodenum. The upper GI endoscopy was                            accomplished without difficulty. The patient                            tolerated the procedure well. Scope In: Scope Out: Findings:      A non-obstructing Schatzki ring was found in the distal esophagus.      A medium-sized hiatal hernia was present.      There is no endoscopic evidence of Barrett's esophagus, bleeding,       esophagitis, inflammation or varices in the entire esophagus.      The stomach was normal.      The examined duodenum was normal. Impression:               - Non-obstructing Schatzki ring.                           - Medium-sized hiatal hernia.                           - Normal stomach.                           - Normal examined duodenum.                           - No specimens collected.                           -  Blood in stool without cause found on endoscopic                            exam Moderate Sedation:      See anesthesia note, no moderate sedation. Recommendation:           - Patient has a contact number available for                            emergencies. The signs and symptoms of potential                            delayed complications were discussed with the                            patient. Return to normal activities tomorrow.                            Written discharge instructions were provided to the                            patient.                           - Resume previous diet.                           - Continue present medications.                           - Return to endoscopist in 6 weeks. Procedure Code(s):        --- Professional ---                            [phone], Esophagogastroduodenoscopy, flexible,                            transoral; diagnostic, including collection of                            specimen(s) by brushing or washing, when performed                            (separate procedure) Diagnosis Code(s):        --- Professional ---                           K92.0, Hematemesis                           K21.9, Gastro-esophageal reflux disease without                            esophagitis                           R10.13, Epigastric pain  K44.9, Diaphragmatic hernia without obstruction or                            gangrene                           K22.2, Esophageal obstruction CPT copyright 2018 American Medical Association. All rights reserved. The codes documented in this report are preliminary and upon coder review may  be revised to meet current compliance requirements. Nancy Fetter Dr., MD 06/11/2018 1:32:53 PM This report has been signed electronically. Number of Addenda: 0

## 2018-06-11 NOTE — Anesthesia Postprocedure Evaluation (Signed)
Anesthesia Post Note  Patient: Virginia Luna  Procedure(s) Performed: ESOPHAGOGASTRODUODENOSCOPY (EGD) WITH PROPOFOL (N/A )     Patient location during evaluation: Endoscopy Anesthesia Type: MAC Level of consciousness: awake and alert Pain management: pain level controlled Vital Signs Assessment: post-procedure vital signs reviewed and stable Respiratory status: spontaneous breathing, nonlabored ventilation, respiratory function stable and patient connected to nasal cannula oxygen Cardiovascular status: stable and blood pressure returned to baseline Postop Assessment: no apparent nausea or vomiting Anesthetic complications: no    Last Vitals:  Vitals:   06/11/18 1335 06/11/18 1350  BP: 133/70 136/84  Pulse: (!) 102   Resp: 18   Temp: 36.7 C   SpO2: 100% 100%    Last Pain:  Vitals:   06/11/18 1350  TempSrc:   PainSc: 0-No pain                 Montez Hageman

## 2018-06-11 NOTE — Discharge Instructions (Addendum)

## 2018-06-11 NOTE — Transfer of Care (Signed)
Immediate Anesthesia Transfer of Care Note  Patient: Virginia Luna  Procedure(s) Performed: ESOPHAGOGASTRODUODENOSCOPY (EGD) WITH PROPOFOL (N/A )  Patient Location: PACU and Endoscopy Unit  Anesthesia Type:MAC  Level of Consciousness: awake, alert  and oriented  Airway & Oxygen Therapy: Patient Spontanous Breathing and Patient connected to nasal cannula oxygen  Post-op Assessment: Report given to RN and Post -op Vital signs reviewed and stable  Post vital signs: Reviewed and stable  Last Vitals:  Vitals Value Taken Time  BP    Temp    Pulse 104 06/11/2018  1:38 PM  Resp 18 06/11/2018  1:38 PM  SpO2 100 % 06/11/2018  1:38 PM  Vitals shown include unvalidated device data.  Last Pain:  Vitals:   06/11/18 1155  TempSrc: Oral  PainSc: 0-No pain         Complications: No apparent anesthesia complications

## 2018-06-11 NOTE — Anesthesia Preprocedure Evaluation (Addendum)
Anesthesia Evaluation  Patient identified by MRN, date of birth, ID band Patient awake    Reviewed: Allergy & Precautions, NPO status , Patient's Chart, lab work & pertinent test results  Airway Mallampati: II  TM Distance: >3 FB Neck ROM: Full    Dental no notable dental hx.    Pulmonary asthma ,    Pulmonary exam normal breath sounds clear to auscultation       Cardiovascular negative cardio ROS Normal cardiovascular exam Rhythm:Regular Rate:Normal     Neuro/Psych negative neurological ROS  negative psych ROS   GI/Hepatic negative GI ROS, Neg liver ROS,   Endo/Other  diabetesMorbid obesity  Renal/GU negative Renal ROS  negative genitourinary   Musculoskeletal  (+) Fibromyalgia -  Abdominal   Peds negative pediatric ROS (+)  Hematology negative hematology ROS (+)   Anesthesia Other Findings   Reproductive/Obstetrics negative OB ROS                            Anesthesia Physical Anesthesia Plan  ASA: III  Anesthesia Plan: MAC   Post-op Pain Management:    Induction:   PONV Risk Score and Plan: 2 and Treatment may vary due to age or medical condition  Airway Management Planned: Nasal Cannula  Additional Equipment:   Intra-op Plan:   Post-operative Plan:   Informed Consent: I have reviewed the patients History and Physical, chart, labs and discussed the procedure including the risks, benefits and alternatives for the proposed anesthesia with the patient or authorized representative who has indicated his/her understanding and acceptance.   Dental advisory given  Plan Discussed with:   Anesthesia Plan Comments:       Anesthesia Quick Evaluation

## 2018-06-11 NOTE — H&P (Signed)
Subjective:   Patient is a 57 y.o. female presents with history of hematemesis and epigastric abdominal pain.  She has had a history of previous gastric ulcer.  With his history it was felt that she needed EGD.  Her pantoprazole was increased to twice daily.  She apparently has had some small amount of nosebleeds since then but no more hematemesis.. Procedure including risks and benefits discussed in office.  There are no active problems to display for this patient.  Past Medical History:  Diagnosis Date  . Arthritis   . Asthma   . Chronic pain   . Diabetes mellitus without complication ([place])   . Fibromyalgia   . Sleep apnea    did use cpap-lost 25lb-says she does not need it  . Wears contact lenses     Past Surgical History:  Procedure Laterality Date  . BREAST BIOPSY    . BREAST EXCISIONAL BIOPSY    . BREAST LUMPECTOMY WITH RADIOACTIVE SEED LOCALIZATION Bilateral 11/24/2014   Procedure: BILATERAL BREAST LUMPECTOMY WITH RADIOACTIVE SEED LOCALIZATION;  Surgeon: [name], MD;  Location: [facility] [facility];  Service: General;  Laterality: Bilateral;  . CHOLECYSTECTOMY    . COLONOSCOPY    . DILATION AND CURETTAGE OF UTERUS    . ESOPHAGOGASTRODUODENOSCOPY (EGD) WITH PROPOFOL N/A 07/23/2016   Procedure: ESOPHAGOGASTRODUODENOSCOPY (EGD) WITH PROPOFOL;  Surgeon: Laurence Spates, MD;  Location: [facility];  Service: Endoscopy;  Laterality: N/A;  . LUMBAR LAMINECTOMY  2010  . UMBILICAL HERNIA REPAIR     age 78  . UPPER GI ENDOSCOPY      Medications Prior to Admission  Medication Sig Dispense Refill Last Dose  . albuterol (PROVENTIL HFA;VENTOLIN HFA) 108 (90 BASE) MCG/ACT inhaler Inhale 2 puffs into the lungs every 6 (six) hours as needed for wheezing or shortness of breath.    Past Week at Unknown time  . amLODipine (NORVASC) 5 MG tablet Take 5 mg by mouth daily.   06/11/2018 at Unknown time  . azelastine (ASTELIN) 0.1 % nasal spray Place 1 spray into both nostrils 2 (two) times  daily as needed for allergies.  2 Past Week at Unknown time  . Azelastine-Fluticasone (DYMISTA) 137-50 MCG/ACT SUSP Place 1 spray into the nose at bedtime.   Past Week at Unknown time  . benzonatate (TESSALON) 200 MG capsule Take 200 mg by mouth 3 (three) times daily as needed for cough.   Past Month at Unknown time  . cyclobenzaprine (FLEXERIL) 10 MG tablet Take 10 mg by mouth 2 (two) times daily as needed for muscle spasms.    06/10/2018 at Unknown time  . diphenhydrAMINE (BENADRYL) 25 MG tablet Take 50 mg by mouth at bedtime.   06/10/2018 at Unknown time  . fexofenadine (ALLEGRA) 180 MG tablet Take 180 mg by mouth daily.  5 06/11/2018 at Unknown time  . fluticasone furoate-vilanterol (BREO ELLIPTA) 200-25 MCG/INH AEPB Inhale 1 puff daily into the lungs.   06/10/2018 at Unknown time  . HYDROcodone-acetaminophen (NORCO/VICODIN) 5-325 MG tablet Take 1 tablet by mouth 2 (two) times daily as needed for moderate pain.   0 Past Week at Unknown time  . Melatonin 5 MG CAPS Take 10-15 mg by mouth at bedtime.   06/10/2018 at Unknown time  . metFORMIN (GLUCOPHAGE-XR) 500 MG 24 hr tablet Take 500 mg by mouth at bedtime.    06/10/2018 at Unknown time  . oxybutynin (DITROPAN) 5 MG tablet Take 5 mg by mouth 2 (two) times daily as needed for bladder spasms.  0 Past Week at Unknown time  . oxyCODONE (OXY IR/ROXICODONE) 5 MG immediate release tablet Take 5 mg by mouth 2 (two) times daily as needed for pain.  0 06/10/2018 at Unknown time  . pantoprazole (PROTONIX) 40 MG tablet Take 40 mg by mouth 2 (two) times daily.  1 06/10/2018 at Unknown time  . promethazine (PHENERGAN) 25 MG tablet Take 25 mg by mouth every 6 (six) hours as needed for nausea or vomiting.   Past Week at Unknown time  . QUEtiapine (SEROQUEL) 300 MG tablet Take 300 mg by mouth at bedtime.   06/10/2018 at Unknown time  . SUMAtriptan (IMITREX) 100 MG tablet Take 100 mg by mouth every 2 (two) hours as needed for migraine. May repeat in 2 hours if  headache persists or recurs.   Past Month at Unknown time  . torsemide (DEMADEX) 20 MG tablet Take 20 mg by mouth daily as needed (swelling).   Past Month at Unknown time  . triamcinolone cream (KENALOG) 0.1 % Apply 1 application topically 2 (two) times daily as needed for rash.  0 Past Week at Unknown time  . zolpidem (AMBIEN) 10 MG tablet Take 10 mg by mouth at bedtime.    06/10/2018 at Unknown time  . EPINEPHrine (EPIPEN 2-PAK) 0.3 mg/0.3 mL IJ SOAJ injection Inject 0.3 mg into the muscle once.   More than a month at Unknown time   Allergies  Allergen Reactions  . Rocephin [Ceftriaxone] Anaphylaxis  . Morphine And Related Nausea And Vomiting    Doesn't work  . Prednisone Itching and Swelling  . Sulfa Antibiotics Itching and Swelling    Social History   Tobacco Use  . Smoking status: Never Smoker  . Smokeless tobacco: Never Used  Substance Use Topics  . Alcohol use: No    Family History  Problem Relation Age of Onset  . Breast cancer Paternal Aunt   . Breast cancer Paternal Grandmother      Objective:   Patient Vitals for the past 8 hrs:  BP Temp Temp src Pulse Resp SpO2 Height Weight  06/11/18 1155 (!) 142/66 98 F (36.7 C) Oral 100 20 98 % 5\' 3"  (1.6 m) 122.5 kg   No intake/output data recorded. No intake/output data recorded.   See MD Preop evaluation      Assessment:   1.  Hematemesis/epigastric pain. 2.  History of previous gastric ulcers 3.  Chronic GERD 4.  Fibromyalgia 5.  Type 2 diabetes 6.  Morbid obesity  Plan:   We will proceed with EGD with propofol sedation.  Has been discussed in the past with the patient she is agreeable.

## 2018-06-12 ENCOUNTER — Encounter (HOSPITAL_COMMUNITY): Payer: Self-pay | Admitting: Gastroenterology

## 2018-06-16 DIAGNOSIS — K920 Hematemesis: Secondary | ICD-10-CM | POA: Diagnosis not present

## 2018-06-16 DIAGNOSIS — R1013 Epigastric pain: Secondary | ICD-10-CM | POA: Diagnosis not present

## 2018-06-16 DIAGNOSIS — K219 Gastro-esophageal reflux disease without esophagitis: Secondary | ICD-10-CM | POA: Diagnosis not present

## 2018-06-18 DIAGNOSIS — J3081 Allergic rhinitis due to animal (cat) (dog) hair and dander: Secondary | ICD-10-CM | POA: Diagnosis not present

## 2018-06-18 DIAGNOSIS — J3089 Other allergic rhinitis: Secondary | ICD-10-CM | POA: Diagnosis not present

## 2018-06-18 DIAGNOSIS — J301 Allergic rhinitis due to pollen: Secondary | ICD-10-CM | POA: Diagnosis not present

## 2018-06-25 DIAGNOSIS — J3081 Allergic rhinitis due to animal (cat) (dog) hair and dander: Secondary | ICD-10-CM | POA: Diagnosis not present

## 2018-06-25 DIAGNOSIS — J3089 Other allergic rhinitis: Secondary | ICD-10-CM | POA: Diagnosis not present

## 2018-06-25 DIAGNOSIS — J301 Allergic rhinitis due to pollen: Secondary | ICD-10-CM | POA: Diagnosis not present

## 2018-07-02 DIAGNOSIS — J3089 Other allergic rhinitis: Secondary | ICD-10-CM | POA: Diagnosis not present

## 2018-07-02 DIAGNOSIS — J3081 Allergic rhinitis due to animal (cat) (dog) hair and dander: Secondary | ICD-10-CM | POA: Diagnosis not present

## 2018-07-02 DIAGNOSIS — J301 Allergic rhinitis due to pollen: Secondary | ICD-10-CM | POA: Diagnosis not present

## 2018-07-09 DIAGNOSIS — J3089 Other allergic rhinitis: Secondary | ICD-10-CM | POA: Diagnosis not present

## 2018-07-09 DIAGNOSIS — J3081 Allergic rhinitis due to animal (cat) (dog) hair and dander: Secondary | ICD-10-CM | POA: Diagnosis not present

## 2018-07-09 DIAGNOSIS — J301 Allergic rhinitis due to pollen: Secondary | ICD-10-CM | POA: Diagnosis not present

## 2018-07-10 ENCOUNTER — Other Ambulatory Visit: Payer: Self-pay

## 2018-07-10 MED ORDER — ZOLPIDEM TARTRATE 10 MG PO TABS: 10.0000 mg | ORAL_TABLET | Freq: Every day | ORAL | 2 refills | Status: DC

## 2018-07-10 MED ORDER — QUETIAPINE FUMARATE 300 MG PO TABS: 300.0000 mg | ORAL_TABLET | Freq: Every day | ORAL | 1 refills | Status: DC

## 2018-07-16 DIAGNOSIS — J3081 Allergic rhinitis due to animal (cat) (dog) hair and dander: Secondary | ICD-10-CM | POA: Diagnosis not present

## 2018-07-16 DIAGNOSIS — J301 Allergic rhinitis due to pollen: Secondary | ICD-10-CM | POA: Diagnosis not present

## 2018-07-16 DIAGNOSIS — J3089 Other allergic rhinitis: Secondary | ICD-10-CM | POA: Diagnosis not present

## 2018-07-23 DIAGNOSIS — J3081 Allergic rhinitis due to animal (cat) (dog) hair and dander: Secondary | ICD-10-CM | POA: Diagnosis not present

## 2018-07-23 DIAGNOSIS — J3089 Other allergic rhinitis: Secondary | ICD-10-CM | POA: Diagnosis not present

## 2018-07-23 DIAGNOSIS — J301 Allergic rhinitis due to pollen: Secondary | ICD-10-CM | POA: Diagnosis not present

## 2018-07-30 DIAGNOSIS — J3081 Allergic rhinitis due to animal (cat) (dog) hair and dander: Secondary | ICD-10-CM | POA: Diagnosis not present

## 2018-07-30 DIAGNOSIS — J301 Allergic rhinitis due to pollen: Secondary | ICD-10-CM | POA: Diagnosis not present

## 2018-07-30 DIAGNOSIS — J3089 Other allergic rhinitis: Secondary | ICD-10-CM | POA: Diagnosis not present

## 2018-08-06 DIAGNOSIS — J3089 Other allergic rhinitis: Secondary | ICD-10-CM | POA: Diagnosis not present

## 2018-08-06 DIAGNOSIS — J3081 Allergic rhinitis due to animal (cat) (dog) hair and dander: Secondary | ICD-10-CM | POA: Diagnosis not present

## 2018-08-06 DIAGNOSIS — J301 Allergic rhinitis due to pollen: Secondary | ICD-10-CM | POA: Diagnosis not present

## 2018-08-19 DIAGNOSIS — J3089 Other allergic rhinitis: Secondary | ICD-10-CM | POA: Diagnosis not present

## 2018-08-19 DIAGNOSIS — J301 Allergic rhinitis due to pollen: Secondary | ICD-10-CM | POA: Diagnosis not present

## 2018-08-19 DIAGNOSIS — J3081 Allergic rhinitis due to animal (cat) (dog) hair and dander: Secondary | ICD-10-CM | POA: Diagnosis not present

## 2018-08-27 DIAGNOSIS — J3081 Allergic rhinitis due to animal (cat) (dog) hair and dander: Secondary | ICD-10-CM | POA: Diagnosis not present

## 2018-08-27 DIAGNOSIS — J301 Allergic rhinitis due to pollen: Secondary | ICD-10-CM | POA: Diagnosis not present

## 2018-08-27 DIAGNOSIS — J3089 Other allergic rhinitis: Secondary | ICD-10-CM | POA: Diagnosis not present

## 2018-09-02 DIAGNOSIS — G894 Chronic pain syndrome: Secondary | ICD-10-CM | POA: Diagnosis not present

## 2018-09-02 DIAGNOSIS — D649 Anemia, unspecified: Secondary | ICD-10-CM | POA: Diagnosis not present

## 2018-09-02 DIAGNOSIS — M7062 Trochanteric bursitis, left hip: Secondary | ICD-10-CM | POA: Diagnosis not present

## 2018-09-02 DIAGNOSIS — E119 Type 2 diabetes mellitus without complications: Secondary | ICD-10-CM | POA: Diagnosis not present

## 2018-09-02 DIAGNOSIS — J4520 Mild intermittent asthma, uncomplicated: Secondary | ICD-10-CM | POA: Diagnosis not present

## 2018-09-02 DIAGNOSIS — I10 Essential (primary) hypertension: Secondary | ICD-10-CM | POA: Diagnosis not present

## 2018-09-02 DIAGNOSIS — E7800 Pure hypercholesterolemia, unspecified: Secondary | ICD-10-CM | POA: Diagnosis not present

## 2018-09-02 DIAGNOSIS — K219 Gastro-esophageal reflux disease without esophagitis: Secondary | ICD-10-CM | POA: Diagnosis not present

## 2018-09-02 DIAGNOSIS — M797 Fibromyalgia: Secondary | ICD-10-CM | POA: Diagnosis not present

## 2018-09-03 DIAGNOSIS — J301 Allergic rhinitis due to pollen: Secondary | ICD-10-CM | POA: Diagnosis not present

## 2018-09-03 DIAGNOSIS — J3081 Allergic rhinitis due to animal (cat) (dog) hair and dander: Secondary | ICD-10-CM | POA: Diagnosis not present

## 2018-09-03 DIAGNOSIS — J3089 Other allergic rhinitis: Secondary | ICD-10-CM | POA: Diagnosis not present

## 2018-09-10 DIAGNOSIS — J3081 Allergic rhinitis due to animal (cat) (dog) hair and dander: Secondary | ICD-10-CM | POA: Diagnosis not present

## 2018-09-10 DIAGNOSIS — J3089 Other allergic rhinitis: Secondary | ICD-10-CM | POA: Diagnosis not present

## 2018-09-10 DIAGNOSIS — J301 Allergic rhinitis due to pollen: Secondary | ICD-10-CM | POA: Diagnosis not present

## 2018-09-17 DIAGNOSIS — J3081 Allergic rhinitis due to animal (cat) (dog) hair and dander: Secondary | ICD-10-CM | POA: Diagnosis not present

## 2018-09-17 DIAGNOSIS — J3089 Other allergic rhinitis: Secondary | ICD-10-CM | POA: Diagnosis not present

## 2018-09-17 DIAGNOSIS — J301 Allergic rhinitis due to pollen: Secondary | ICD-10-CM | POA: Diagnosis not present

## 2018-09-24 DIAGNOSIS — J3081 Allergic rhinitis due to animal (cat) (dog) hair and dander: Secondary | ICD-10-CM | POA: Diagnosis not present

## 2018-09-24 DIAGNOSIS — J301 Allergic rhinitis due to pollen: Secondary | ICD-10-CM | POA: Diagnosis not present

## 2018-09-24 DIAGNOSIS — J3089 Other allergic rhinitis: Secondary | ICD-10-CM | POA: Diagnosis not present

## 2018-09-29 DIAGNOSIS — J301 Allergic rhinitis due to pollen: Secondary | ICD-10-CM | POA: Diagnosis not present

## 2018-09-29 DIAGNOSIS — J3081 Allergic rhinitis due to animal (cat) (dog) hair and dander: Secondary | ICD-10-CM | POA: Diagnosis not present

## 2018-10-01 DIAGNOSIS — J3081 Allergic rhinitis due to animal (cat) (dog) hair and dander: Secondary | ICD-10-CM | POA: Diagnosis not present

## 2018-10-01 DIAGNOSIS — J301 Allergic rhinitis due to pollen: Secondary | ICD-10-CM | POA: Diagnosis not present

## 2018-10-01 DIAGNOSIS — J3089 Other allergic rhinitis: Secondary | ICD-10-CM | POA: Diagnosis not present

## 2018-10-10 DIAGNOSIS — J3081 Allergic rhinitis due to animal (cat) (dog) hair and dander: Secondary | ICD-10-CM | POA: Diagnosis not present

## 2018-10-10 DIAGNOSIS — J301 Allergic rhinitis due to pollen: Secondary | ICD-10-CM | POA: Diagnosis not present

## 2018-10-10 DIAGNOSIS — J3089 Other allergic rhinitis: Secondary | ICD-10-CM | POA: Diagnosis not present

## 2018-10-15 DIAGNOSIS — J3089 Other allergic rhinitis: Secondary | ICD-10-CM | POA: Diagnosis not present

## 2018-10-15 DIAGNOSIS — J301 Allergic rhinitis due to pollen: Secondary | ICD-10-CM | POA: Diagnosis not present

## 2018-10-15 DIAGNOSIS — J3081 Allergic rhinitis due to animal (cat) (dog) hair and dander: Secondary | ICD-10-CM | POA: Diagnosis not present

## 2018-10-17 DIAGNOSIS — D649 Anemia, unspecified: Secondary | ICD-10-CM | POA: Diagnosis not present

## 2018-10-17 DIAGNOSIS — I10 Essential (primary) hypertension: Secondary | ICD-10-CM | POA: Diagnosis not present

## 2018-10-17 DIAGNOSIS — J4521 Mild intermittent asthma with (acute) exacerbation: Secondary | ICD-10-CM | POA: Diagnosis not present

## 2018-10-17 DIAGNOSIS — E119 Type 2 diabetes mellitus without complications: Secondary | ICD-10-CM | POA: Diagnosis not present

## 2018-10-17 DIAGNOSIS — Z7984 Long term (current) use of oral hypoglycemic drugs: Secondary | ICD-10-CM | POA: Diagnosis not present

## 2018-10-21 ENCOUNTER — Other Ambulatory Visit: Payer: Self-pay | Admitting: Psychiatry

## 2018-10-22 DIAGNOSIS — J301 Allergic rhinitis due to pollen: Secondary | ICD-10-CM | POA: Diagnosis not present

## 2018-10-22 DIAGNOSIS — J3089 Other allergic rhinitis: Secondary | ICD-10-CM | POA: Diagnosis not present

## 2018-10-22 DIAGNOSIS — J3081 Allergic rhinitis due to animal (cat) (dog) hair and dander: Secondary | ICD-10-CM | POA: Diagnosis not present

## 2018-10-24 DIAGNOSIS — J3089 Other allergic rhinitis: Secondary | ICD-10-CM | POA: Diagnosis not present

## 2018-10-28 DIAGNOSIS — W19XXXA Unspecified fall, initial encounter: Secondary | ICD-10-CM | POA: Diagnosis not present

## 2018-10-28 DIAGNOSIS — M545 Low back pain: Secondary | ICD-10-CM | POA: Diagnosis not present

## 2018-10-28 DIAGNOSIS — S0093XA Contusion of unspecified part of head, initial encounter: Secondary | ICD-10-CM | POA: Diagnosis not present

## 2018-10-28 DIAGNOSIS — S8002XA Contusion of left knee, initial encounter: Secondary | ICD-10-CM | POA: Diagnosis not present

## 2018-10-29 ENCOUNTER — Other Ambulatory Visit: Payer: Self-pay

## 2018-10-29 DIAGNOSIS — D649 Anemia, unspecified: Secondary | ICD-10-CM | POA: Diagnosis not present

## 2018-10-29 DIAGNOSIS — I10 Essential (primary) hypertension: Secondary | ICD-10-CM | POA: Diagnosis not present

## 2018-10-29 DIAGNOSIS — Z7984 Long term (current) use of oral hypoglycemic drugs: Secondary | ICD-10-CM | POA: Diagnosis not present

## 2018-10-29 DIAGNOSIS — J4521 Mild intermittent asthma with (acute) exacerbation: Secondary | ICD-10-CM | POA: Diagnosis not present

## 2018-10-29 DIAGNOSIS — E119 Type 2 diabetes mellitus without complications: Secondary | ICD-10-CM | POA: Diagnosis not present

## 2018-10-29 DIAGNOSIS — J301 Allergic rhinitis due to pollen: Secondary | ICD-10-CM | POA: Diagnosis not present

## 2018-10-29 DIAGNOSIS — J3081 Allergic rhinitis due to animal (cat) (dog) hair and dander: Secondary | ICD-10-CM | POA: Diagnosis not present

## 2018-10-29 DIAGNOSIS — J3089 Other allergic rhinitis: Secondary | ICD-10-CM | POA: Diagnosis not present

## 2018-10-29 MED ORDER — QUETIAPINE FUMARATE 300 MG PO TABS: ORAL_TABLET | ORAL | 0 refills | Status: DC

## 2018-10-29 MED ORDER — ZOLPIDEM TARTRATE 10 MG PO TABS: 10.0000 mg | ORAL_TABLET | Freq: Every day | ORAL | 2 refills | Status: DC

## 2018-10-29 NOTE — Telephone Encounter (Signed)
Received fax from [facility] pt is changing to this pharmacy, requesting rx's for Zolpidem 10mg  1 at hs and Quetiapine 300mg  1 at hs.   Last office visit 04/2018, due in by June 2020 for next appt.

## 2018-11-05 DIAGNOSIS — J301 Allergic rhinitis due to pollen: Secondary | ICD-10-CM | POA: Diagnosis not present

## 2018-11-05 DIAGNOSIS — J3089 Other allergic rhinitis: Secondary | ICD-10-CM | POA: Diagnosis not present

## 2018-11-05 DIAGNOSIS — J3081 Allergic rhinitis due to animal (cat) (dog) hair and dander: Secondary | ICD-10-CM | POA: Diagnosis not present

## 2018-11-12 DIAGNOSIS — J3081 Allergic rhinitis due to animal (cat) (dog) hair and dander: Secondary | ICD-10-CM | POA: Diagnosis not present

## 2018-11-12 DIAGNOSIS — J301 Allergic rhinitis due to pollen: Secondary | ICD-10-CM | POA: Diagnosis not present

## 2018-11-12 DIAGNOSIS — J3089 Other allergic rhinitis: Secondary | ICD-10-CM | POA: Diagnosis not present

## 2018-11-19 DIAGNOSIS — J301 Allergic rhinitis due to pollen: Secondary | ICD-10-CM | POA: Diagnosis not present

## 2018-11-19 DIAGNOSIS — J3089 Other allergic rhinitis: Secondary | ICD-10-CM | POA: Diagnosis not present

## 2018-11-19 DIAGNOSIS — J3081 Allergic rhinitis due to animal (cat) (dog) hair and dander: Secondary | ICD-10-CM | POA: Diagnosis not present

## 2018-11-26 DIAGNOSIS — J301 Allergic rhinitis due to pollen: Secondary | ICD-10-CM | POA: Diagnosis not present

## 2018-11-26 DIAGNOSIS — J3081 Allergic rhinitis due to animal (cat) (dog) hair and dander: Secondary | ICD-10-CM | POA: Diagnosis not present

## 2018-11-26 DIAGNOSIS — J3089 Other allergic rhinitis: Secondary | ICD-10-CM | POA: Diagnosis not present

## 2018-12-02 DIAGNOSIS — I10 Essential (primary) hypertension: Secondary | ICD-10-CM | POA: Diagnosis not present

## 2018-12-02 DIAGNOSIS — F5101 Primary insomnia: Secondary | ICD-10-CM | POA: Diagnosis not present

## 2018-12-02 DIAGNOSIS — Z0000 Encounter for general adult medical examination without abnormal findings: Secondary | ICD-10-CM | POA: Diagnosis not present

## 2018-12-02 DIAGNOSIS — E119 Type 2 diabetes mellitus without complications: Secondary | ICD-10-CM | POA: Diagnosis not present

## 2018-12-02 DIAGNOSIS — M25562 Pain in left knee: Secondary | ICD-10-CM | POA: Diagnosis not present

## 2018-12-02 DIAGNOSIS — J4520 Mild intermittent asthma, uncomplicated: Secondary | ICD-10-CM | POA: Diagnosis not present

## 2018-12-02 DIAGNOSIS — Z7984 Long term (current) use of oral hypoglycemic drugs: Secondary | ICD-10-CM | POA: Diagnosis not present

## 2018-12-02 DIAGNOSIS — G894 Chronic pain syndrome: Secondary | ICD-10-CM | POA: Diagnosis not present

## 2018-12-02 DIAGNOSIS — G43909 Migraine, unspecified, not intractable, without status migrainosus: Secondary | ICD-10-CM | POA: Diagnosis not present

## 2018-12-02 DIAGNOSIS — E7800 Pure hypercholesterolemia, unspecified: Secondary | ICD-10-CM | POA: Diagnosis not present

## 2018-12-02 DIAGNOSIS — M797 Fibromyalgia: Secondary | ICD-10-CM | POA: Diagnosis not present

## 2018-12-02 DIAGNOSIS — D649 Anemia, unspecified: Secondary | ICD-10-CM | POA: Diagnosis not present

## 2018-12-03 DIAGNOSIS — J301 Allergic rhinitis due to pollen: Secondary | ICD-10-CM | POA: Diagnosis not present

## 2018-12-03 DIAGNOSIS — J3081 Allergic rhinitis due to animal (cat) (dog) hair and dander: Secondary | ICD-10-CM | POA: Diagnosis not present

## 2018-12-03 DIAGNOSIS — J3089 Other allergic rhinitis: Secondary | ICD-10-CM | POA: Diagnosis not present

## 2018-12-10 DIAGNOSIS — J3089 Other allergic rhinitis: Secondary | ICD-10-CM | POA: Diagnosis not present

## 2018-12-10 DIAGNOSIS — J3081 Allergic rhinitis due to animal (cat) (dog) hair and dander: Secondary | ICD-10-CM | POA: Diagnosis not present

## 2018-12-10 DIAGNOSIS — J301 Allergic rhinitis due to pollen: Secondary | ICD-10-CM | POA: Diagnosis not present

## 2018-12-11 DIAGNOSIS — J4521 Mild intermittent asthma with (acute) exacerbation: Secondary | ICD-10-CM | POA: Diagnosis not present

## 2018-12-11 DIAGNOSIS — J4520 Mild intermittent asthma, uncomplicated: Secondary | ICD-10-CM | POA: Diagnosis not present

## 2018-12-11 DIAGNOSIS — I10 Essential (primary) hypertension: Secondary | ICD-10-CM | POA: Diagnosis not present

## 2018-12-11 DIAGNOSIS — D649 Anemia, unspecified: Secondary | ICD-10-CM | POA: Diagnosis not present

## 2018-12-11 DIAGNOSIS — E119 Type 2 diabetes mellitus without complications: Secondary | ICD-10-CM | POA: Diagnosis not present

## 2018-12-11 DIAGNOSIS — Z7984 Long term (current) use of oral hypoglycemic drugs: Secondary | ICD-10-CM | POA: Diagnosis not present

## 2018-12-11 DIAGNOSIS — E7800 Pure hypercholesterolemia, unspecified: Secondary | ICD-10-CM | POA: Diagnosis not present

## 2018-12-17 DIAGNOSIS — J3081 Allergic rhinitis due to animal (cat) (dog) hair and dander: Secondary | ICD-10-CM | POA: Diagnosis not present

## 2018-12-17 DIAGNOSIS — J3089 Other allergic rhinitis: Secondary | ICD-10-CM | POA: Diagnosis not present

## 2018-12-17 DIAGNOSIS — J301 Allergic rhinitis due to pollen: Secondary | ICD-10-CM | POA: Diagnosis not present

## 2018-12-25 DIAGNOSIS — J3089 Other allergic rhinitis: Secondary | ICD-10-CM | POA: Diagnosis not present

## 2018-12-25 DIAGNOSIS — J3081 Allergic rhinitis due to animal (cat) (dog) hair and dander: Secondary | ICD-10-CM | POA: Diagnosis not present

## 2018-12-25 DIAGNOSIS — J301 Allergic rhinitis due to pollen: Secondary | ICD-10-CM | POA: Diagnosis not present

## 2018-12-31 DIAGNOSIS — J3081 Allergic rhinitis due to animal (cat) (dog) hair and dander: Secondary | ICD-10-CM | POA: Diagnosis not present

## 2018-12-31 DIAGNOSIS — J301 Allergic rhinitis due to pollen: Secondary | ICD-10-CM | POA: Diagnosis not present

## 2018-12-31 DIAGNOSIS — J3089 Other allergic rhinitis: Secondary | ICD-10-CM | POA: Diagnosis not present

## 2019-01-01 ENCOUNTER — Other Ambulatory Visit: Payer: Self-pay

## 2019-01-01 ENCOUNTER — Ambulatory Visit (INDEPENDENT_AMBULATORY_CARE_PROVIDER_SITE_OTHER): Payer: PPO | Admitting: Psychiatry

## 2019-01-01 ENCOUNTER — Encounter: Payer: Self-pay | Admitting: Psychiatry

## 2019-01-01 DIAGNOSIS — F419 Anxiety disorder, unspecified: Secondary | ICD-10-CM

## 2019-01-01 DIAGNOSIS — F5105 Insomnia due to other mental disorder: Secondary | ICD-10-CM | POA: Diagnosis not present

## 2019-01-01 DIAGNOSIS — F450 Somatization disorder: Secondary | ICD-10-CM | POA: Diagnosis not present

## 2019-01-01 NOTE — Progress Notes (Signed)
Virginia Luna [id number] April 07, 1961 58 y.o.  Virtual Visit via Telephone Note  I connected with@ on 01/01/19 at  9:00 AM EDT by telephone and verified that I am speaking with the correct person using two identifiers.   I discussed the limitations, risks, security and privacy concerns of performing an evaluation and management service by telephone and the availability of in person appointments. I also discussed with the patient that there may be a patient responsible charge related to this service. The patient expressed understanding and agreed to proceed.   I discussed the assessment and treatment plan with the patient. The patient was provided an opportunity to ask questions and all were answered. The patient agreed with the plan and demonstrated an understanding of the instructions.   The patient was advised to call back or seek an in-person evaluation if the symptoms worsen or if the condition fails to improve as anticipated.  I provided 15 minutes of non-face-to-face time during this encounter.  The patient was located at home.  The provider was located at home.   Purnell Shoemaker, MD   Subjective:   Patient ID:  Virginia Luna is a 58 y.o. (DOB 1960/12/07) female.  Chief Complaint:  Chief Complaint  Patient presents with  . Follow-up    Medication Management    HPI Virginia Luna presents for follow-up of chronic insomnia.  Last seen April 24, 2018.  No meds were changed   Sleep affected by pain from a fall a couple months ago. Getting at the most 4 hours of sleep at night.  Awakens in pain from FM and related to fall.  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  That helps with perspective.  Look forward to it.    Patient reports stable mood and denies depressed or irritable moods.  Patient denies any recent difficulty with anxiety.  Patient denies difficulty with sleep initiation but does with maintenance. Denies appetite disturbance.  Patient reports  that energy and motivation have been good.  Patient denies any difficulty with concentration.  Patient denies any suicidal ideation.   Past Psychiatric Medication Trials: Doxepin, Rozerem,  amitriptyline mirtazapine Ambien quetiapine olanzapine Lyrica duloxetine gabapentin, cyclobenzaprine  Review of Systems:  Review of Systems  HENT: Positive for postnasal drip, rhinorrhea and sneezing.   Musculoskeletal: Positive for back pain and myalgias.  Neurological: Positive for weakness. Negative for tremors.    Medications: I have reviewed the patient's current medications.  Current Outpatient Medications  Medication Sig Dispense Refill  . albuterol (PROVENTIL HFA;VENTOLIN HFA) 108 (90 BASE) MCG/ACT inhaler Inhale 2 puffs into the lungs every 6 (six) hours as needed for wheezing or shortness of breath.     Marland Kitchen amLODipine (NORVASC) 5 MG tablet Take 5 mg by mouth daily.    Marland Kitchen azelastine (ASTELIN) 0.1 % nasal spray Place 1 spray into both nostrils 2 (two) times daily as needed for allergies.  2  . Azelastine-Fluticasone (DYMISTA) 137-50 MCG/ACT SUSP Place 1 spray into the nose at bedtime.    . cyclobenzaprine (FLEXERIL) 10 MG tablet Take 10 mg by mouth 2 (two) times daily as needed for muscle spasms.     . diclofenac (VOLTAREN) 75 MG EC tablet     . diphenhydrAMINE (BENADRYL) 25 MG tablet Take 50 mg by mouth at bedtime.    Marland Kitchen EPINEPHrine (EPIPEN 2-PAK) 0.3 mg/0.3 mL IJ SOAJ injection Inject 0.3 mg into the muscle once.    . fexofenadine (ALLEGRA) 180 MG tablet Take 180  mg by mouth daily.  5  . fluticasone furoate-vilanterol (BREO ELLIPTA) 200-25 MCG/INH AEPB Inhale 1 puff daily into the lungs.    . gabapentin (NEURONTIN) 300 MG capsule Take 300 mg by mouth at bedtime.     Marland Kitchen HYDROcodone-acetaminophen (NORCO/VICODIN) 5-325 MG tablet Take 1 tablet by mouth 2 (two) times daily as needed for moderate pain.   0  . Melatonin 5 MG CAPS Take 10-15 mg by mouth at bedtime.    . metFORMIN (GLUCOPHAGE-XR) 500 MG  24 hr tablet Take 500 mg by mouth at bedtime.     Marland Kitchen oxybutynin (DITROPAN) 5 MG tablet Take 5 mg by mouth 2 (two) times daily as needed for bladder spasms.  0  . oxyCODONE (OXY IR/ROXICODONE) 5 MG immediate release tablet Take 5 mg by mouth 2 (two) times daily as needed for pain.  0  . pantoprazole (PROTONIX) 40 MG tablet Take 40 mg by mouth 2 (two) times daily.  1  . promethazine (PHENERGAN) 25 MG tablet Take 25 mg by mouth every 6 (six) hours as needed for nausea or vomiting.    Marland Kitchen QUEtiapine (SEROQUEL) 300 MG tablet TAKE 1 TABLET BY MOUTH EVERYDAY AT BEDTIME 90 tablet 0  . SUMAtriptan (IMITREX) 100 MG tablet Take 100 mg by mouth every 2 (two) hours as needed for migraine. May repeat in 2 hours if headache persists or recurs.    . torsemide (DEMADEX) 20 MG tablet Take 20 mg by mouth daily as needed (swelling).    . triamcinolone cream (KENALOG) 0.1 % Apply 1 application topically 2 (two) times daily as needed for rash.  0  . zolpidem (AMBIEN) 10 MG tablet Take 1 tablet (10 mg total) by mouth at bedtime. 30 tablet 2   No current facility-administered medications for this visit.     Medication Side Effects: None  Allergies:  Allergies  Allergen Reactions  . Rocephin [Ceftriaxone] Anaphylaxis  . Morphine And Related Nausea And Vomiting    Doesn't work  . Prednisone Itching and Swelling  . Sulfa Antibiotics Itching and Swelling    Past Medical History:  Diagnosis Date  . Arthritis   . Asthma   . Chronic pain   . Diabetes mellitus without complication ([place])   . Fibromyalgia   . Sleep apnea    did use cpap-lost 25lb-says she does not need it  . Wears contact lenses     Family History  Problem Relation Age of Onset  . Breast cancer Paternal Aunt   . Breast cancer Paternal Grandmother     Social History   Socioeconomic History  . Marital status: Single    Spouse name: Not on file  . Number of children: Not on file  . Years of education: Not on file  . Highest education  level: Not on file  Occupational History  . Not on file  Social Needs  . Financial resource strain: Not on file  . Food insecurity:    Worry: Not on file    Inability: Not on file  . Transportation needs:    Medical: Not on file    Non-medical: Not on file  Tobacco Use  . Smoking status: Never Smoker  . Smokeless tobacco: Never Used  Substance and Sexual Activity  . Alcohol use: No  . Drug use: No  . Sexual activity: Not on file  Lifestyle  . Physical activity:    Days per week: Not on file    Minutes per session: Not on file  .  Stress: Not on file  Relationships  . Social connections:    Talks on phone: Not on file    Gets together: Not on file    Attends religious service: Not on file    Active member of club or organization: Not on file    Attends meetings of clubs or organizations: Not on file    Relationship status: Not on file  . Intimate partner violence:    Fear of current or ex partner: Not on file    Emotionally abused: Not on file    Physically abused: Not on file    Forced sexual activity: Not on file  Other Topics Concern  . Not on file  Social History Narrative  . Not on file    Past Medical History, Surgical history, Social history, and Family history were reviewed and updated as appropriate.   Please see review of systems for further details on the patient's review from today.   Objective:   Physical Exam:  There were no vitals taken for this visit.  Physical Exam Neurological:     Mental Status: She is alert and oriented to person, place, and time.     Cranial Nerves: No dysarthria.  Psychiatric:        Attention and Perception: Attention normal. She does not perceive auditory hallucinations.        Mood and Affect: Mood is anxious. Mood is not depressed.        Speech: Speech normal.        Behavior: Behavior is cooperative.        Thought Content: Thought content normal. Thought content is not paranoid or delusional. Thought content does  not include homicidal or suicidal ideation. Thought content does not include homicidal or suicidal plan.        Cognition and Memory: Cognition and memory normal.        Judgment: Judgment normal.     Comments: Chronic somatic focus. Insight fair.     Lab Review:     Component Value Date/Time   NA 141 07/02/2016 2205   K 3.9 07/02/2016 2205   CL 106 07/02/2016 2205   CO2 24 07/02/2016 2204   GLUCOSE 107 (H) 07/02/2016 2205   BUN 7 07/02/2016 2205   CREATININE 0.50 07/02/2016 2205   CALCIUM 9.3 07/02/2016 2204   PROT 7.2 07/02/2016 2204   ALBUMIN 4.2 07/02/2016 2204   AST 23 07/02/2016 2204   ALT 27 07/02/2016 2204   ALKPHOS 96 07/02/2016 2204   BILITOT 0.4 07/02/2016 2204   GFRNONAA >60 07/02/2016 2204   GFRAA >60 07/02/2016 2204       Component Value Date/Time   WBC 11.1 (H) 07/02/2016 2204   RBC 4.58 07/02/2016 2204   HGB 12.9 07/02/2016 2205   HCT 38.0 07/02/2016 2205   PLT 442 (H) 07/02/2016 2204   MCV 82.1 07/02/2016 2204   MCH 25.8 (L) 07/02/2016 2204   MCHC 31.4 07/02/2016 2204   RDW 17.9 (H) 07/02/2016 2204   LYMPHSABS 2.1 10/13/2008 1230   MONOABS 0.3 10/13/2008 1230   EOSABS 0.3 10/13/2008 1230   BASOSABS 0.0 10/13/2008 1230    No results found for: POCLITH, LITHIUM   No results found for: PHENYTOIN, PHENOBARB, VALPROATE, CBMZ   .res Assessment: Plan:    Anxiety with somatization  Insomnia due to mental condition   We discussed the short-term risks associated with benzodiazepines including sedation and increased fall risk among others.  Discussed long-term side effect risk including dependence,  potential withdrawal symptoms, and the potential eventual dose-related risk of dementia. Discussed the possibility of splitting the zolpidem between sleep onset and then with the early morning awakening.  She says she is tried that and does better about taking it all at once first thing going to bed.  We discussed the risk of amnesia.  She is not had any  problems with that.  Discussed potential metabolic side effects associated with atypical antipsychotics, as well as potential risk for movement side effects. Advised pt to contact office if movement side effects occur.  She has required quetiapine for chronic severe insomnia that is related to both anxiety and to fibromyalgia.  She gets clear benefit from quetiapine.  She is at the lowest effective dose.  We have tried to reduce dosages.  She is tolerating it well.  Follow-up 9 months because of stability.  She has been on the same meds at the same dosage for several years.  Hiram Comber, MD, DFAPA   Please see After Visit Summary for patient specific instructions.  No future appointments.  No orders of the defined types were placed in this encounter.     -------------------------------

## 2019-01-07 DIAGNOSIS — J3081 Allergic rhinitis due to animal (cat) (dog) hair and dander: Secondary | ICD-10-CM | POA: Diagnosis not present

## 2019-01-07 DIAGNOSIS — J3089 Other allergic rhinitis: Secondary | ICD-10-CM | POA: Diagnosis not present

## 2019-01-07 DIAGNOSIS — J301 Allergic rhinitis due to pollen: Secondary | ICD-10-CM | POA: Diagnosis not present

## 2019-01-14 DIAGNOSIS — J3081 Allergic rhinitis due to animal (cat) (dog) hair and dander: Secondary | ICD-10-CM | POA: Diagnosis not present

## 2019-01-14 DIAGNOSIS — J301 Allergic rhinitis due to pollen: Secondary | ICD-10-CM | POA: Diagnosis not present

## 2019-01-14 DIAGNOSIS — J3089 Other allergic rhinitis: Secondary | ICD-10-CM | POA: Diagnosis not present

## 2019-01-16 DIAGNOSIS — D649 Anemia, unspecified: Secondary | ICD-10-CM | POA: Diagnosis not present

## 2019-01-16 DIAGNOSIS — E7800 Pure hypercholesterolemia, unspecified: Secondary | ICD-10-CM | POA: Diagnosis not present

## 2019-01-16 DIAGNOSIS — Z7984 Long term (current) use of oral hypoglycemic drugs: Secondary | ICD-10-CM | POA: Diagnosis not present

## 2019-01-16 DIAGNOSIS — E119 Type 2 diabetes mellitus without complications: Secondary | ICD-10-CM | POA: Diagnosis not present

## 2019-01-16 DIAGNOSIS — J4521 Mild intermittent asthma with (acute) exacerbation: Secondary | ICD-10-CM | POA: Diagnosis not present

## 2019-01-16 DIAGNOSIS — J4520 Mild intermittent asthma, uncomplicated: Secondary | ICD-10-CM | POA: Diagnosis not present

## 2019-01-16 DIAGNOSIS — I10 Essential (primary) hypertension: Secondary | ICD-10-CM | POA: Diagnosis not present

## 2019-01-22 DIAGNOSIS — E119 Type 2 diabetes mellitus without complications: Secondary | ICD-10-CM | POA: Diagnosis not present

## 2019-01-22 DIAGNOSIS — E7800 Pure hypercholesterolemia, unspecified: Secondary | ICD-10-CM | POA: Diagnosis not present

## 2019-01-22 DIAGNOSIS — Z7984 Long term (current) use of oral hypoglycemic drugs: Secondary | ICD-10-CM | POA: Diagnosis not present

## 2019-01-22 DIAGNOSIS — J4540 Moderate persistent asthma, uncomplicated: Secondary | ICD-10-CM | POA: Diagnosis not present

## 2019-01-22 DIAGNOSIS — J3081 Allergic rhinitis due to animal (cat) (dog) hair and dander: Secondary | ICD-10-CM | POA: Diagnosis not present

## 2019-01-22 DIAGNOSIS — J4521 Mild intermittent asthma with (acute) exacerbation: Secondary | ICD-10-CM | POA: Diagnosis not present

## 2019-01-22 DIAGNOSIS — D649 Anemia, unspecified: Secondary | ICD-10-CM | POA: Diagnosis not present

## 2019-01-22 DIAGNOSIS — J4520 Mild intermittent asthma, uncomplicated: Secondary | ICD-10-CM | POA: Diagnosis not present

## 2019-01-22 DIAGNOSIS — J301 Allergic rhinitis due to pollen: Secondary | ICD-10-CM | POA: Diagnosis not present

## 2019-01-22 DIAGNOSIS — J3089 Other allergic rhinitis: Secondary | ICD-10-CM | POA: Diagnosis not present

## 2019-01-22 DIAGNOSIS — I10 Essential (primary) hypertension: Secondary | ICD-10-CM | POA: Diagnosis not present

## 2019-01-28 DIAGNOSIS — J3081 Allergic rhinitis due to animal (cat) (dog) hair and dander: Secondary | ICD-10-CM | POA: Diagnosis not present

## 2019-01-28 DIAGNOSIS — J3089 Other allergic rhinitis: Secondary | ICD-10-CM | POA: Diagnosis not present

## 2019-01-28 DIAGNOSIS — J301 Allergic rhinitis due to pollen: Secondary | ICD-10-CM | POA: Diagnosis not present

## 2019-02-04 DIAGNOSIS — J3089 Other allergic rhinitis: Secondary | ICD-10-CM | POA: Diagnosis not present

## 2019-02-04 DIAGNOSIS — J3081 Allergic rhinitis due to animal (cat) (dog) hair and dander: Secondary | ICD-10-CM | POA: Diagnosis not present

## 2019-02-04 DIAGNOSIS — J301 Allergic rhinitis due to pollen: Secondary | ICD-10-CM | POA: Diagnosis not present

## 2019-02-11 DIAGNOSIS — J3089 Other allergic rhinitis: Secondary | ICD-10-CM | POA: Diagnosis not present

## 2019-02-11 DIAGNOSIS — J3081 Allergic rhinitis due to animal (cat) (dog) hair and dander: Secondary | ICD-10-CM | POA: Diagnosis not present

## 2019-02-11 DIAGNOSIS — J301 Allergic rhinitis due to pollen: Secondary | ICD-10-CM | POA: Diagnosis not present

## 2019-02-18 DIAGNOSIS — J3081 Allergic rhinitis due to animal (cat) (dog) hair and dander: Secondary | ICD-10-CM | POA: Diagnosis not present

## 2019-02-18 DIAGNOSIS — J3089 Other allergic rhinitis: Secondary | ICD-10-CM | POA: Diagnosis not present

## 2019-02-18 DIAGNOSIS — J301 Allergic rhinitis due to pollen: Secondary | ICD-10-CM | POA: Diagnosis not present

## 2019-02-19 ENCOUNTER — Other Ambulatory Visit: Payer: Self-pay

## 2019-02-19 ENCOUNTER — Ambulatory Visit (INDEPENDENT_AMBULATORY_CARE_PROVIDER_SITE_OTHER): Payer: PPO | Admitting: Family Medicine

## 2019-02-19 ENCOUNTER — Telehealth: Payer: Self-pay | Admitting: Family Medicine

## 2019-02-19 ENCOUNTER — Ambulatory Visit: Payer: PPO

## 2019-02-19 ENCOUNTER — Encounter: Payer: Self-pay | Admitting: Family Medicine

## 2019-02-19 DIAGNOSIS — G8929 Other chronic pain: Secondary | ICD-10-CM | POA: Diagnosis not present

## 2019-02-19 DIAGNOSIS — M25561 Pain in right knee: Secondary | ICD-10-CM | POA: Diagnosis not present

## 2019-02-19 DIAGNOSIS — M25562 Pain in left knee: Secondary | ICD-10-CM

## 2019-02-19 NOTE — Telephone Encounter (Signed)
Patient called stated PT needs copy of Xrays --Belinda Block [phone]

## 2019-02-19 NOTE — Progress Notes (Signed)
Office Visit Note   Patient: Virginia Luna           Date of Birth: Oct 10, 1960           MRN: [id number] Visit Date: 02/19/2019 Requested by: Shirline Frees, MD [address] [place],  [place] [id number] PCP: Shirline Frees, MD  Subjective: Chief Complaint  Patient presents with  . Left Knee - Pain    Knee pain, popping, instability. Fell 3 months ago - knee swelled and was bruised initially. Has shooting pains down the lower leg from the knee. Difficulty with walking long distances.    HPI: She is here with left greater than right knee pain.  About 3 months ago she was at [facility] allergy clinic, slipped and fell backward.  She had bruising on the front of her left knee.  She was seen at an urgent care, no x-rays were obtained.  She was given diclofenac which has helped but is not getting rid of her pain.  Pain is best in the mornings, worse as the day goes on.  Both knees hurt on the anterior medial aspect.  She has noticed some swelling in her knees, occasional popping when she bends them, no locking.  They feel like they are going to give way sometimes.  No previous problems with her knees.               ROS: No fevers or chills.  All other systems were reviewed and are negative.  Objective: Vital Signs: There were no vitals taken for this visit.  Physical Exam:  General:  Alert and oriented, in no acute distress. Pulm:  Breathing unlabored. Psy:  Normal mood, congruent affect. Skin: No bruising or erythema today. Knees: 1-2+ patellofemoral crepitus in both knees.  1+ effusion in both knees.  No warmth to the touch.  Ligaments feel stable, but slight laxity with valgus stress on both sides.  Very tender on the medial joint line of both knees and medial patellofemoral joint.  No palpable click with McMurray's.  Imaging: X-rays knees: Bilateral moderate medial compartment joint space narrowing and periarticular spurring.  Moderate patellofemoral spurring in both  knees as well.  No sign of subacute fracture.  Assessment & Plan: 1.  Left greater than right knee pain 3 months status post fall, suspect exacerbation of pre-existing but previously asymptomatic osteoarthritis.  Cannot rule out meniscal tear. -We will try physical therapy, glucosamine and turmeric.  Continue diclofenac as needed. -If she fails to improve we will obtain an MRI scan of the most symptomatic knee.  If she does not have significant meniscal pathology, then possibly cortisone injection or Visco supplementation.     Procedures: No procedures performed  No notes on file     PMFS History: There are no active problems to display for this patient.  Past Medical History:  Diagnosis Date  . Arthritis   . Asthma   . Chronic pain   . Diabetes mellitus without complication ([place])   . Fibromyalgia   . Sleep apnea    did use cpap-lost 25lb-says she does not need it  . Wears contact lenses     Family History  Problem Relation Age of Onset  . Breast cancer Paternal Aunt   . Breast cancer Paternal Grandmother     Past Surgical History:  Procedure Laterality Date  . BREAST BIOPSY    . BREAST EXCISIONAL BIOPSY    . BREAST LUMPECTOMY WITH RADIOACTIVE SEED LOCALIZATION Bilateral 11/24/2014  Procedure: BILATERAL BREAST LUMPECTOMY WITH RADIOACTIVE SEED LOCALIZATION;  Surgeon: [name], MD;  Location: [facility] [place] [facility];  Service: General;  Laterality: Bilateral;  . CHOLECYSTECTOMY    . COLONOSCOPY    . DILATION AND CURETTAGE OF UTERUS    . ESOPHAGOGASTRODUODENOSCOPY (EGD) WITH PROPOFOL N/A 07/23/2016   Procedure: ESOPHAGOGASTRODUODENOSCOPY (EGD) WITH PROPOFOL;  Surgeon: Laurence Spates, MD;  Location: [facility];  Service: Endoscopy;  Laterality: N/A;  . ESOPHAGOGASTRODUODENOSCOPY (EGD) WITH PROPOFOL N/A 06/11/2018   Procedure: ESOPHAGOGASTRODUODENOSCOPY (EGD) WITH PROPOFOL;  Surgeon: Laurence Spates, MD;  Location: [facility];  Service: Endoscopy;  Laterality: N/A;   . LUMBAR LAMINECTOMY  2010  . UMBILICAL HERNIA REPAIR     age 71  . UPPER GI ENDOSCOPY     Social History   Occupational History  . Not on file  Tobacco Use  . Smoking status: Never Smoker  . Smokeless tobacco: Never Used  Substance and Sexual Activity  . Alcohol use: No  . Drug use: No  . Sexual activity: Not on file

## 2019-02-19 NOTE — Patient Instructions (Signed)
    Arthritis:  - Glucosamine Sulfate 1,000 mg twice daily  - Turmeric 500 mg twice daily

## 2019-02-24 NOTE — Telephone Encounter (Signed)
Left message on [facility] voice mail to call back and let us know if it is the interpretation of the xray that they need faxed or if they need the actual xray. If the latter is the case, we would need to put them on CD or send paper copy by patient or mail. Will await return call.

## 2019-02-25 ENCOUNTER — Telehealth: Payer: Self-pay | Admitting: Family Medicine

## 2019-02-25 DIAGNOSIS — M25562 Pain in left knee: Secondary | ICD-10-CM

## 2019-02-25 DIAGNOSIS — J301 Allergic rhinitis due to pollen: Secondary | ICD-10-CM | POA: Diagnosis not present

## 2019-02-25 DIAGNOSIS — J3089 Other allergic rhinitis: Secondary | ICD-10-CM | POA: Diagnosis not present

## 2019-02-25 DIAGNOSIS — G8929 Other chronic pain: Secondary | ICD-10-CM

## 2019-02-25 DIAGNOSIS — J3081 Allergic rhinitis due to animal (cat) (dog) hair and dander: Secondary | ICD-10-CM | POA: Diagnosis not present

## 2019-02-25 NOTE — Telephone Encounter (Signed)
Do you have another preference, or should we try one of the [facility] locations?

## 2019-02-25 NOTE — Telephone Encounter (Signed)
Patient called advised the (PT) she was referred to do not take accident insurance (third party)  Patient asked if she can be referred to someone else.   The number to contact patient is [phone]

## 2019-02-25 NOTE — Telephone Encounter (Signed)
The patient will not be going to [facility], as they do not accept 3rd party insurance. Dr. Junius Roads will be referring her to another PT facility.

## 2019-02-26 NOTE — Telephone Encounter (Signed)
I advised her that a new referral has been placed. [facility] should be calling her to schedule an appointment at one of their locations.

## 2019-02-26 NOTE — Telephone Encounter (Signed)
Order placed

## 2019-02-27 ENCOUNTER — Other Ambulatory Visit: Payer: Self-pay | Admitting: Psychiatry

## 2019-03-02 ENCOUNTER — Telehealth: Payer: Self-pay | Admitting: Psychiatry

## 2019-03-02 ENCOUNTER — Telehealth: Payer: Self-pay | Admitting: Family Medicine

## 2019-03-02 NOTE — Telephone Encounter (Signed)
I called the patient. The referral was sent to the [place] location on [facility]. I gave the patient their phone number so she may call them and schedule her own appointment.

## 2019-03-02 NOTE — Telephone Encounter (Signed)
Please fax 10 mg., Zolpiden to [facility]

## 2019-03-02 NOTE — Telephone Encounter (Signed)
Pended for approval.

## 2019-03-02 NOTE — Telephone Encounter (Signed)
Can you please submit?

## 2019-03-02 NOTE — Telephone Encounter (Signed)
Pt called in said she has not heard anything in regards to her referral to physical therapy.   [phone]

## 2019-03-03 DIAGNOSIS — J4520 Mild intermittent asthma, uncomplicated: Secondary | ICD-10-CM | POA: Diagnosis not present

## 2019-03-03 DIAGNOSIS — I10 Essential (primary) hypertension: Secondary | ICD-10-CM | POA: Diagnosis not present

## 2019-03-03 DIAGNOSIS — E7800 Pure hypercholesterolemia, unspecified: Secondary | ICD-10-CM | POA: Diagnosis not present

## 2019-03-03 DIAGNOSIS — G43909 Migraine, unspecified, not intractable, without status migrainosus: Secondary | ICD-10-CM | POA: Diagnosis not present

## 2019-03-03 DIAGNOSIS — E119 Type 2 diabetes mellitus without complications: Secondary | ICD-10-CM | POA: Diagnosis not present

## 2019-03-03 DIAGNOSIS — K219 Gastro-esophageal reflux disease without esophagitis: Secondary | ICD-10-CM | POA: Diagnosis not present

## 2019-03-03 DIAGNOSIS — Z7984 Long term (current) use of oral hypoglycemic drugs: Secondary | ICD-10-CM | POA: Diagnosis not present

## 2019-03-03 DIAGNOSIS — G894 Chronic pain syndrome: Secondary | ICD-10-CM | POA: Diagnosis not present

## 2019-03-04 DIAGNOSIS — J3081 Allergic rhinitis due to animal (cat) (dog) hair and dander: Secondary | ICD-10-CM | POA: Diagnosis not present

## 2019-03-04 DIAGNOSIS — J3089 Other allergic rhinitis: Secondary | ICD-10-CM | POA: Diagnosis not present

## 2019-03-04 DIAGNOSIS — J301 Allergic rhinitis due to pollen: Secondary | ICD-10-CM | POA: Diagnosis not present

## 2019-03-06 ENCOUNTER — Other Ambulatory Visit: Payer: Self-pay | Admitting: Psychiatry

## 2019-03-09 DIAGNOSIS — D649 Anemia, unspecified: Secondary | ICD-10-CM | POA: Diagnosis not present

## 2019-03-09 DIAGNOSIS — E119 Type 2 diabetes mellitus without complications: Secondary | ICD-10-CM | POA: Diagnosis not present

## 2019-03-09 DIAGNOSIS — E7800 Pure hypercholesterolemia, unspecified: Secondary | ICD-10-CM | POA: Diagnosis not present

## 2019-03-09 DIAGNOSIS — J4520 Mild intermittent asthma, uncomplicated: Secondary | ICD-10-CM | POA: Diagnosis not present

## 2019-03-09 DIAGNOSIS — I10 Essential (primary) hypertension: Secondary | ICD-10-CM | POA: Diagnosis not present

## 2019-03-09 DIAGNOSIS — Z7984 Long term (current) use of oral hypoglycemic drugs: Secondary | ICD-10-CM | POA: Diagnosis not present

## 2019-03-11 DIAGNOSIS — J3089 Other allergic rhinitis: Secondary | ICD-10-CM | POA: Diagnosis not present

## 2019-03-11 DIAGNOSIS — J3081 Allergic rhinitis due to animal (cat) (dog) hair and dander: Secondary | ICD-10-CM | POA: Diagnosis not present

## 2019-03-11 DIAGNOSIS — J301 Allergic rhinitis due to pollen: Secondary | ICD-10-CM | POA: Diagnosis not present

## 2019-03-13 ENCOUNTER — Other Ambulatory Visit: Payer: Self-pay | Admitting: Family Medicine

## 2019-03-13 DIAGNOSIS — Z1231 Encounter for screening mammogram for malignant neoplasm of breast: Secondary | ICD-10-CM

## 2019-03-13 DIAGNOSIS — Z01411 Encounter for gynecological examination (general) (routine) with abnormal findings: Secondary | ICD-10-CM | POA: Diagnosis not present

## 2019-03-13 DIAGNOSIS — N950 Postmenopausal bleeding: Secondary | ICD-10-CM | POA: Diagnosis not present

## 2019-03-13 DIAGNOSIS — Z6841 Body Mass Index (BMI) 40.0 and over, adult: Secondary | ICD-10-CM | POA: Diagnosis not present

## 2019-03-16 DIAGNOSIS — J3089 Other allergic rhinitis: Secondary | ICD-10-CM | POA: Diagnosis not present

## 2019-03-16 DIAGNOSIS — J3081 Allergic rhinitis due to animal (cat) (dog) hair and dander: Secondary | ICD-10-CM | POA: Diagnosis not present

## 2019-03-16 DIAGNOSIS — J301 Allergic rhinitis due to pollen: Secondary | ICD-10-CM | POA: Diagnosis not present

## 2019-03-18 DIAGNOSIS — J3081 Allergic rhinitis due to animal (cat) (dog) hair and dander: Secondary | ICD-10-CM | POA: Diagnosis not present

## 2019-03-18 DIAGNOSIS — J3089 Other allergic rhinitis: Secondary | ICD-10-CM | POA: Diagnosis not present

## 2019-03-18 DIAGNOSIS — J301 Allergic rhinitis due to pollen: Secondary | ICD-10-CM | POA: Diagnosis not present

## 2019-03-19 ENCOUNTER — Encounter: Payer: Self-pay | Admitting: Physical Therapy

## 2019-03-19 ENCOUNTER — Other Ambulatory Visit: Payer: Self-pay

## 2019-03-19 ENCOUNTER — Ambulatory Visit: Payer: PPO | Attending: Family Medicine | Admitting: Physical Therapy

## 2019-03-19 DIAGNOSIS — R262 Difficulty in walking, not elsewhere classified: Secondary | ICD-10-CM | POA: Insufficient documentation

## 2019-03-19 DIAGNOSIS — G8929 Other chronic pain: Secondary | ICD-10-CM | POA: Insufficient documentation

## 2019-03-19 DIAGNOSIS — M25561 Pain in right knee: Secondary | ICD-10-CM | POA: Insufficient documentation

## 2019-03-19 DIAGNOSIS — M25562 Pain in left knee: Secondary | ICD-10-CM | POA: Diagnosis not present

## 2019-03-19 DIAGNOSIS — M6281 Muscle weakness (generalized): Secondary | ICD-10-CM | POA: Diagnosis not present

## 2019-03-20 ENCOUNTER — Encounter: Payer: Self-pay | Admitting: Physical Therapy

## 2019-03-20 NOTE — Therapy (Deleted)
[place], [place], [address] Phone: [phone]   Fax:  [phone]  Physical Therapy Treatment  Patient Details  Name: Virginia Luna MRN: [id number] Date of Birth: 07-Oct-1960 Referring Provider (PT): Dr Eunice Blase    Encounter Date: 03/19/2019  PT End of Session - 03/20/19 0753    Visit Number  1    Number of Visits  12    Date for PT Re-Evaluation  05/01/19    Authorization Type  third party payer    PT Start Time  1306   patient 6 minutes late   PT Stop Time  1345    PT Time Calculation (min)  39 min    Activity Tolerance  Patient tolerated treatment well    Behavior During Therapy  [facility] for tasks assessed/performed       Past Medical History:  Diagnosis Date  . Arthritis   . Asthma   . Chronic pain   . Diabetes mellitus without complication ([place])   . Fibromyalgia   . Sleep apnea    did use cpap-lost 25lb-says she does not need it  . Wears contact lenses     Past Surgical History:  Procedure Laterality Date  . BREAST BIOPSY    . BREAST EXCISIONAL BIOPSY    . BREAST LUMPECTOMY WITH RADIOACTIVE SEED LOCALIZATION Bilateral 11/24/2014   Procedure: BILATERAL BREAST LUMPECTOMY WITH RADIOACTIVE SEED LOCALIZATION;  Surgeon: [name], MD;  Location: [place];  Service: General;  Laterality: Bilateral;  . CHOLECYSTECTOMY    . COLONOSCOPY    . DILATION AND CURETTAGE OF UTERUS    . ESOPHAGOGASTRODUODENOSCOPY (EGD) WITH PROPOFOL N/A 07/23/2016   Procedure: ESOPHAGOGASTRODUODENOSCOPY (EGD) WITH PROPOFOL;  Surgeon: Laurence Spates, MD;  Location: [facility];  Service: Endoscopy;  Laterality: N/A;  . ESOPHAGOGASTRODUODENOSCOPY (EGD) WITH PROPOFOL N/A 06/11/2018   Procedure: ESOPHAGOGASTRODUODENOSCOPY (EGD) WITH PROPOFOL;  Surgeon: Laurence Spates, MD;  Location: [facility];  Service: Endoscopy;  Laterality: N/A;  . LUMBAR LAMINECTOMY  2010  . UMBILICAL HERNIA REPAIR     age 58   . UPPER GI ENDOSCOPY      There were no vitals filed for this visit.  Subjective Assessment - 03/19/19 1312    Subjective  Patient had a fall in March that caused some bruising of her left knee. She also had pain of the right knee. She has not had any falls since that point.    Limitations  Lifting;Standing    How long can you sit comfortably?  Sitting for too long stiffens her knees    How long can you stand comfortably?  varies how long she can stand    How long can you walk comfortably?  Can walk around the grocery store but has to lean on a cart.    Diagnostic tests  X-ray but results arent in the computer; Per patient bone spurs and arthritis.    Currently in Pain?  Yes    Pain Score  8     Pain Location  Knee    Pain Orientation  Left    Pain Descriptors / Indicators  Aching;Burning    Pain Type  Chronic pain    Pain Radiating Towards  with light touch patient reports pain shooting up her legs    Pain Onset  More than a month ago    Pain Frequency  Constant    Aggravating Factors   standing ,walking, transfering    Pain Relieving Factors  rest,  ice    Effect of Pain on Daily Activities  difficulty    Multiple Pain Sites  No    Pain Score  3    Pain Location  Knee    Pain Orientation  Right    Pain Descriptors / Indicators  Aching    Pain Type  Chronic pain    Pain Onset  More than a month ago    Pain Frequency  Constant    Aggravating Factors   standing and walking    Pain Relieving Factors  rest    Effect of Pain on Daily Activities  difficulty perfroming ADL's         [facility] PT Assessment - 03/20/19 0001      Assessment   Medical Diagnosis  Bilateral knee pain     Referring Provider (PT)  Dr Legrand Como Hilts     Onset Date/Surgical Date  --   January 2020    Hand Dominance  Right    Next MD Visit  after PT     Prior Therapy  None       Precautions   Precautions  None      Restrictions   Weight Bearing Restrictions  No      Balance Screen   Has the  patient fallen in the past 6 months  No    Has the patient had a decrease in activity level because of a fear of falling?   No    Is the patient reluctant to leave their home because of a fear of falling?   No      Home Environment   Additional Comments  A few steps into her house from the outside       Prior Function   Level of Independence  Independent    Vocation  On disability    Leisure  exercising, Sambo, walking for exercise       Cognition   Overall Cognitive Status  Within Functional Limits for tasks assessed    Attention  Focused      Observation/Other Assessments   Focus on Therapeutic Outcomes (FOTO)   82% percent limitation 54% limitation       Sensation   Light Touch  Appears Intact      Coordination   Gross Motor Movements are Fluid and Coordinated  Yes    Fine Motor Movements are Fluid and Coordinated  Yes      ROM / Strength   AROM / PROM / Strength  AROM;PROM;Strength      AROM   AROM Assessment Site  Lumbar    Lumbar Flexion  --   pain with end range flexion but more comfortbale in flexion    Lumbar Extension  --   unable to sully extend knees in supine      PROM   Overall PROM Comments  pain with end range flexion and extension       Strength   Strength Assessment Site  Hip;Knee;Ankle    Right/Left Hip  Right;Left    Right Hip Flexion  4/5    Right Hip ABduction  4/5    Left Hip Flexion  3+/5    Left Hip ABduction  3+/5    Right/Left Knee  Right;Left    Right Knee Flexion  4/5    Right Knee Extension  4/5    Left Knee Flexion  3+/5    Left Knee Extension  3+/5      Flexibility   Soft Tissue Assessment /Muscle  Length  yes    Hamstrings  unable to test 2nd to pain with extension       Palpation   Palpation comment  palpation of the left knee shoots pain up; plapation of the right knee gave her pain acrossed her abdomen       Special Tests   Other special tests  Not perfromed 2nd to tenderness to palpation       Bed Mobility   Bed  Mobility  --   unable to lie flat in supine      Ambulation/Gait   Gait Comments  significant lateral movement with gait, walks with very little knee or hip flexion;                    [facility] Adult PT Treatment/Exercise - 03/20/19 0001      Exercises   Exercises  Knee/Hip      Knee/Hip Exercises: Stretches   Active Hamstring Stretch Limitations  seated hamstring stretch 3x20 sec hold       Knee/Hip Exercises: Seated   Other Seated Knee/Hip Exercises  quad set x5 with 5 sec hold mod cuing     Other Seated Knee/Hip Exercises  patella mobilization with mod cuing and education              PT Education - 03/20/19 0747    Education Details  reviewed HEP and symptom management    Person(s) Educated  Patient    Methods  Explanation;Demonstration;Tactile cues;Verbal cues    Comprehension  Verbalized understanding;Returned demonstration;Verbal cues required;Tactile cues required       PT Short Term Goals - 03/20/19 0801      PT SHORT TERM GOAL #1   Title  Patient will be indepdnent with basic HEP    Time  3    Period  Weeks    Status  New    Target Date  04/10/19      PT SHORT TERM GOAL #2   Title  Patient will demonstrate a 50% improvement in patella movement    Time  3    Period  Weeks    Status  New    Target Date  04/10/19      PT SHORT TERM GOAL #3   Title  Patient will extend bilateral LE without increased pain    Time  3    Period  Weeks    Status  New    Target Date  04/10/19        PT Long Term Goals - 03/20/19 0802      PT LONG TERM GOAL #1   Title  Patient will transfer sit to stand independently without use of hands and without increased pain    Time  6    Period  Weeks    Status  New    Target Date  05/01/19      PT LONG TERM GOAL #2   Title  Patient will stand for 10 minutes without increased pain in order to perfrom ADL's    Time  6    Period  Weeks    Status  New    Target Date  05/01/19      PT LONG TERM GOAL #3   Title   Patient will ambualte 150' without increased pain with LRAD in order to improve ability to go to appointments    Time  6    Period  Weeks    Status  New    Target  Date  05/01/19            Plan - 03/19/19 1559    Clinical Impression Statement  Patient is a 58 year old female with bilateral knee pain L > R. She has moderate knee degeneration and bone spurs but she never had pain until she fell in January. Since that point she has had progressive pain. She is to the point where she feels like she can not stand from a chair. She also feels like she is having difficulty getting in and out of her bathtub. She has pain with passive flexion and extension. She is unable to fully extend her knees in supine. She has very little movement of both patellas. She is weraing sandals today. shehas bilateral flat foot. she has inserts from her podiatrist. she was encouraged to use them when she is going out in the community. She would benefit from skilled therapy to improve strength and stability of bilateral LE.    Personal Factors and Comorbidities  Comorbidity 1;Comorbidity 2;Comorbidity 3+    Comorbidities  fibromaylgia, anxiety, obesity    Examination-Activity Limitations  Transfers;Locomotion Level;Sit;Sleep;Squat;Stairs;Stand    Examination-Participation Restrictions  Community Activity;Cleaning;Laundry    Clinical Decision Making  Moderate    Rehab Potential  Good    PT Frequency  2x / week    PT Duration  6 weeks    PT Treatment/Interventions  ADLs/Self Care Home Management;Cryotherapy;Electrical Stimulation;Moist Heat;Traction;Ultrasound;DME Instruction;Stair training;Functional mobility training;Therapeutic activities;Therapeutic exercise;Gait training;Neuromuscular re-education;Patient/family education;Passive range of motion;Manual techniques;Taping;Spinal Manipulations;Joint Manipulations    PT Next Visit Plan  continue quad strengthening. She can lie supine as long as her legs are bent. Consider  SAQ, Quad sets, maybe SLR, consider standing exercises, consider modalities. Consider use of cane for gait.    PT Home Exercise Plan  HEP, symptom management    Consulted and Agree with Plan of Care  Patient       Patient will benefit from skilled therapeutic intervention in order to improve the following deficits and impairments:  Abnormal gait, Decreased range of motion, Difficulty walking, Obesity, Decreased endurance, Decreased mobility, Decreased strength, Improper body mechanics, Decreased activity tolerance, Pain  Visit Diagnosis: 1. Chronic pain of left knee   2. Chronic pain of right knee   3. Difficulty in walking, not elsewhere classified   4. Muscle weakness (generalized)        Problem List There are no active problems to display for this patient.   Carney Living  PT DPT  03/20/2019, 8:25 AM  [facility] [address] [place], [place], [address] Phone: [phone]   Fax:  [phone]  Name: Virginia Luna MRN: [id number] Date of Birth: 11/16/1960

## 2019-03-20 NOTE — Therapy (Signed)
[place], [place], [address] Phone: [phone]   Fax:  [phone]  Physical Therapy Evaluation  Patient Details  Name: Virginia Luna MRN: [id number] Date of Birth: 10/31/1960 Referring Provider (PT): Dr Eunice Blase    Encounter Date: 03/19/2019  PT End of Session - 03/20/19 0753    Visit Number  1    Number of Visits  12    Date for PT Re-Evaluation  05/01/19    Authorization Type  third party payer    PT Start Time  1306   patient 6 minutes late   PT Stop Time  1345    PT Time Calculation (min)  39 min    Activity Tolerance  Patient tolerated treatment well    Behavior During Therapy  [facility] for tasks assessed/performed       Past Medical History:  Diagnosis Date  . Arthritis   . Asthma   . Chronic pain   . Diabetes mellitus without complication ([place])   . Fibromyalgia   . Sleep apnea    did use cpap-lost 25lb-says she does not need it  . Wears contact lenses     Past Surgical History:  Procedure Laterality Date  . BREAST BIOPSY    . BREAST EXCISIONAL BIOPSY    . BREAST LUMPECTOMY WITH RADIOACTIVE SEED LOCALIZATION Bilateral 11/24/2014   Procedure: BILATERAL BREAST LUMPECTOMY WITH RADIOACTIVE SEED LOCALIZATION;  Surgeon: [name], MD;  Location: [place];  Service: General;  Laterality: Bilateral;  . CHOLECYSTECTOMY    . COLONOSCOPY    . DILATION AND CURETTAGE OF UTERUS    . ESOPHAGOGASTRODUODENOSCOPY (EGD) WITH PROPOFOL N/A 07/23/2016   Procedure: ESOPHAGOGASTRODUODENOSCOPY (EGD) WITH PROPOFOL;  Surgeon: Laurence Spates, MD;  [facility];  Service: Endoscopy;  Laterality: N/A;  . ESOPHAGOGASTRODUODENOSCOPY (EGD) WITH PROPOFOL N/A 06/11/2018   Procedure: ESOPHAGOGASTRODUODENOSCOPY (EGD) WITH PROPOFOL;  Surgeon: Laurence Spates, MD;  [facility];  Service: Endoscopy;  Laterality: N/A;  . LUMBAR LAMINECTOMY  2010  . UMBILICAL HERNIA REPAIR     age  18  . UPPER GI ENDOSCOPY      There were no vitals filed for this visit.   Subjective Assessment - 03/19/19 1312    Subjective  Patient had a fall in March that caused some bruising of her left knee. She also had pain of the right knee. She has not had any falls since that point.    Limitations  Lifting;Standing    How long can you sit comfortably?  Sitting for too long stiffens her knees    How long can you stand comfortably?  varies how long she can stand    How long can you walk comfortably?  Can walk around the grocery store but has to lean on a cart.    Diagnostic tests  X-ray but results arent in the computer; Per patient bone spurs and arthritis.    Currently in Pain?  Yes    Pain Score  8     Pain Location  Knee    Pain Orientation  Left    Pain Descriptors / Indicators  Aching;Burning    Pain Type  Chronic pain    Pain Radiating Towards  with light touch patient reports pain shooting up her legs    Pain Onset  More than a month ago    Pain Frequency  Constant    Aggravating Factors   standing ,walking, transfering    Pain Relieving Factors  rest, ice    Effect of Pain on Daily Activities  difficulty    Multiple Pain Sites  No    Pain Score  3    Pain Location  Knee    Pain Orientation  Right    Pain Descriptors / Indicators  Aching    Pain Type  Chronic pain    Pain Onset  More than a month ago    Pain Frequency  Constant    Aggravating Factors   standing and walking    Pain Relieving Factors  rest    Effect of Pain on Daily Activities  difficulty perfroming ADL's         [facility] PT Assessment - 03/20/19 0001      Assessment   Medical Diagnosis  Bilateral knee pain     Referring Provider (PT)  Dr Legrand Como Hilts     Onset Date/Surgical Date  --   January 2020    Hand Dominance  Right    Next MD Visit  after PT     Prior Therapy  None       Precautions   Precautions  None      Restrictions   Weight Bearing Restrictions  No      Balance Screen   Has the  patient fallen in the past 6 months  No    Has the patient had a decrease in activity level because of a fear of falling?   No    Is the patient reluctant to leave their home because of a fear of falling?   No      Home Environment   Additional Comments  A few steps into her house from the outside       Prior Function   Level of Independence  Independent    Vocation  On disability    Leisure  exercising, Sambo, walking for exercise       Cognition   Overall Cognitive Status  Within Functional Limits for tasks assessed    Attention  Focused      Observation/Other Assessments   Focus on Therapeutic Outcomes (FOTO)   82% percent limitation 54% limitation       Sensation   Light Touch  Appears Intact      Coordination   Gross Motor Movements are Fluid and Coordinated  Yes    Fine Motor Movements are Fluid and Coordinated  Yes      ROM / Strength   AROM / PROM / Strength  AROM;PROM;Strength      AROM   AROM Assessment Site  Lumbar    Lumbar Flexion  --   pain with end range flexion but more comfortbale in flexion    Lumbar Extension  --   unable to sully extend knees in supine      PROM   Overall PROM Comments  pain with end range flexion and extension       Strength   Strength Assessment Site  Hip;Knee;Ankle    Right/Left Hip  Right;Left    Right Hip Flexion  4/5    Right Hip ABduction  4/5    Left Hip Flexion  3+/5    Left Hip ABduction  3+/5    Right/Left Knee  Right;Left    Right Knee Flexion  4/5    Right Knee Extension  4/5    Left Knee Flexion  3+/5    Left Knee Extension  3+/5      Flexibility   Soft Tissue Assessment /  Muscle Length  yes    Hamstrings  unable to test 2nd to pain with extension       Palpation   Palpation comment  palpation of the left knee shoots pain up; plapation of the right knee gave her pain acrossed her abdomen       Special Tests   Other special tests  Not perfromed 2nd to tenderness to palpation       Bed Mobility   Bed  Mobility  --   unable to lie flat in supine      Ambulation/Gait   Gait Comments  significant lateral movement with gait, walks with very little knee or hip flexion;                 Objective measurements completed on examination: See above findings.      [place] Adult PT Treatment/Exercise - 03/20/19 0001      Exercises   Exercises  Knee/Hip      Knee/Hip Exercises: Stretches   Active Hamstring Stretch Limitations  seated hamstring stretch 3x20 sec hold       Knee/Hip Exercises: Seated   Other Seated Knee/Hip Exercises  quad set x5 with 5 sec hold mod cuing     Other Seated Knee/Hip Exercises  patella mobilization with mod cuing and education              PT Education - 03/20/19 0747    Education Details  reviewed HEP and symptom management    Person(s) Educated  Patient    Methods  Explanation;Demonstration;Tactile cues;Verbal cues    Comprehension  Verbalized understanding;Returned demonstration;Verbal cues required;Tactile cues required       PT Short Term Goals - 03/20/19 0801      PT SHORT TERM GOAL #1   Title  Patient will be indepdnent with basic HEP    Time  3    Period  Weeks    Status  New    Target Date  04/10/19      PT SHORT TERM GOAL #2   Title  Patient will demonstrate a 50% improvement in patella movement    Time  3    Period  Weeks    Status  New    Target Date  04/10/19      PT SHORT TERM GOAL #3   Title  Patient will extend bilateral LE without increased pain    Time  3    Period  Weeks    Status  New    Target Date  04/10/19        PT Long Term Goals - 03/20/19 0802      PT LONG TERM GOAL #1   Title  Patient will transfer sit to stand independently without use of hands and without increased pain    Time  6    Period  Weeks    Status  New    Target Date  05/01/19      PT LONG TERM GOAL #2   Title  Patient will stand for 10 minutes without increased pain in order to perfrom ADL's    Time  6    Period  Weeks     Status  New    Target Date  05/01/19      PT LONG TERM GOAL #3   Title  Patient will ambualte 150' without increased pain with LRAD in order to improve ability to go to appointments    Time  6    Period  Weeks    Status  New    Target Date  05/01/19             Plan - 03/19/19 1559    Clinical Impression Statement  Patient is a 58 year old female with bilateral knee pain L > R. She has moderate knee degeneration and bone spurs but she never had pain until she fell in January. Since that point she has had progressive pain. She is to the point where she feels like she can not stand from a chair. She also feels like she is having difficulty getting in and out of her bathtub. She has pain with passive flexion and extension. She is unable to fully extend her knees in supine. She has very little movement of both patellas. She is weraing sandals today. shehas bilateral flat foot. she has inserts from her podiatrist. she was encouraged to use them when she is going out in the community. She would benefit from skilled therapy to improve strength and stability of bilateral LE.    Personal Factors and Comorbidities  Comorbidity 1;Comorbidity 2;Comorbidity 3+    Comorbidities  fibromaylgia, anxiety, obesity    Examination-Activity Limitations  Transfers;Locomotion Level;Sit;Sleep;Squat;Stairs;Stand    Examination-Participation Restrictions  Community Activity;Cleaning;Laundry    Clinical Decision Making  Moderate    Rehab Potential  Good    PT Frequency  2x / week    PT Duration  6 weeks    PT Treatment/Interventions  ADLs/Self Care Home Management;Cryotherapy;Electrical Stimulation;Moist Heat;Traction;Ultrasound;DME Instruction;Stair training;Functional mobility training;Therapeutic activities;Therapeutic exercise;Gait training;Neuromuscular re-education;Patient/family education;Passive range of motion;Manual techniques;Taping;Spinal Manipulations;Joint Manipulations    PT Next Visit Plan  continue  quad strengthening. She can lie supine as long as her legs are bent. Consider SAQ, Quad sets, maybe SLR, consider standing exercises, consider modalities. Consider use of cane for gait.    PT Home Exercise Plan  HEP, symptom management    Consulted and Agree with Plan of Care  Patient       Patient will benefit from skilled therapeutic intervention in order to improve the following deficits and impairments:  Abnormal gait, Decreased range of motion, Difficulty walking, Obesity, Decreased endurance, Decreased mobility, Decreased strength, Improper body mechanics, Decreased activity tolerance, Pain  Visit Diagnosis: 1. Chronic pain of left knee   2. Chronic pain of right knee   3. Difficulty in walking, not elsewhere classified   4. Muscle weakness (generalized)        Problem List There are no active problems to display for this patient.   Carney Living PT DPT  03/20/2019, 8:25 AM  [facility] [address] [place], [place], [address] Phone: [phone]   Fax:  [phone]  Name: WILLINE SCHWALBE MRN: [id number] Date of Birth: [birth year]

## 2019-03-24 ENCOUNTER — Other Ambulatory Visit: Payer: Self-pay

## 2019-03-24 ENCOUNTER — Ambulatory Visit: Payer: PPO | Attending: Family Medicine | Admitting: Physical Therapy

## 2019-03-24 DIAGNOSIS — M25562 Pain in left knee: Secondary | ICD-10-CM | POA: Diagnosis not present

## 2019-03-24 DIAGNOSIS — M6281 Muscle weakness (generalized): Secondary | ICD-10-CM | POA: Diagnosis not present

## 2019-03-24 DIAGNOSIS — G8929 Other chronic pain: Secondary | ICD-10-CM | POA: Diagnosis not present

## 2019-03-24 DIAGNOSIS — M25561 Pain in right knee: Secondary | ICD-10-CM | POA: Diagnosis not present

## 2019-03-24 DIAGNOSIS — R262 Difficulty in walking, not elsewhere classified: Secondary | ICD-10-CM

## 2019-03-24 NOTE — Therapy (Signed)
[place], [place], [address] Phone: [phone]   Fax:  [phone]  Physical Therapy Treatment  Patient Details  Name: Virginia Luna MRN: [id number] Date of Birth: 16-Dec-1960 Referring Provider (PT): Dr Eunice Blase    Encounter Date: 03/24/2019  PT End of Session - 03/24/19 0943    Visit Number  2    Number of Visits  12    Date for PT Re-Evaluation  05/01/19    Authorization Type  third party payer    PT Start Time  [phone]    PT Stop Time  0932    PT Time Calculation (min)  41 min       Past Medical History:  Diagnosis Date  . Arthritis   . Asthma   . Chronic pain   . Diabetes mellitus without complication ([place])   . Fibromyalgia   . Sleep apnea    did use cpap-lost 25lb-says she does not need it  . Wears contact lenses     Past Surgical History:  Procedure Laterality Date  . BREAST BIOPSY    . BREAST EXCISIONAL BIOPSY    . BREAST LUMPECTOMY WITH RADIOACTIVE SEED LOCALIZATION Bilateral 11/24/2014   Procedure: BILATERAL BREAST LUMPECTOMY WITH RADIOACTIVE SEED LOCALIZATION;  Surgeon: [name], MD;  Location: [facility];  Service: General;  Laterality: Bilateral;  . CHOLECYSTECTOMY    . COLONOSCOPY    . DILATION AND CURETTAGE OF UTERUS    . ESOPHAGOGASTRODUODENOSCOPY (EGD) WITH PROPOFOL N/A 07/23/2016   Procedure: ESOPHAGOGASTRODUODENOSCOPY (EGD) WITH PROPOFOL;  Surgeon: Laurence Spates, MD;  Location: [facility];  Service: Endoscopy;  Laterality: N/A;  . ESOPHAGOGASTRODUODENOSCOPY (EGD) WITH PROPOFOL N/A 06/11/2018   Procedure: ESOPHAGOGASTRODUODENOSCOPY (EGD) WITH PROPOFOL;  Surgeon: Laurence Spates, MD;  Location: [facility];  Service: Endoscopy;  Laterality: N/A;  . LUMBAR LAMINECTOMY  2010  . UMBILICAL HERNIA REPAIR     age 26  . UPPER GI ENDOSCOPY      There were no vitals filed for this visit.                    [facility] Adult PT  Treatment/Exercise - 03/24/19 0001      Ambulation/Gait   Gait Comments  trial of SPC , she reports no improvement in pain, continues with antalgic pattern.       Self-Care   Self-Care  Other Self-Care Comments    Other Self-Care Comments   Cut Custom Orthotics to fit prescribed sneakers      Knee/Hip Exercises: Stretches   Active Hamstring Stretch Limitations  seated hamstring stretch 3x20 sec hold    3 deep breaths each side.      Knee/Hip Exercises: Seated   Long Arc Quad  Right;Left;10 reps      Knee/Hip Exercises: Supine   Short Arc Quad Sets  10 reps    Heel Slides  10 reps    Hip Adduction Isometric  10 reps    Bridges  10 reps    Straight Leg Raises  5 reps      Manual Therapy   Manual therapy comments  patella mobs              PT Education - 03/24/19 0952    Education Details  HEP    Person(s) Educated  Patient    Methods  Explanation;Handout    Comprehension  Verbalized understanding       PT Short Term Goals - 03/20/19 0801  PT SHORT TERM GOAL #1   Title  Patient will be indepdnent with basic HEP    Time  3    Period  Weeks    Status  New    Target Date  04/10/19      PT SHORT TERM GOAL #2   Title  Patient will demonstrate a 50% improvement in patella movement    Time  3    Period  Weeks    Status  New    Target Date  04/10/19      PT SHORT TERM GOAL #3   Title  Patient will extend bilateral LE without increased pain    Time  3    Period  Weeks    Status  New    Target Date  04/10/19        PT Long Term Goals - 03/20/19 0802      PT LONG TERM GOAL #1   Title  Patient will transfer sit to stand independently without use of hands and without increased pain    Time  6    Period  Weeks    Status  New    Target Date  05/01/19      PT LONG TERM GOAL #2   Title  Patient will stand for 10 minutes without increased pain in order to perfrom ADL's    Time  6    Period  Weeks    Status  New    Target Date  05/01/19      PT LONG  TERM GOAL #3   Title  Patient will ambualte 150' without increased pain with LRAD in order to improve ability to go to appointments    Time  6    Period  Weeks    Status  New    Target Date  05/01/19            Plan - 03/24/19 0943    Clinical Impression Statement  Pt arrived carrying a box with custom orthotics and new sneakers that were prescribed over a year ago. She requested the orthotics to be cut to fot her shoes so time spent with that. Recommended she start with 1 hour per day and progress as tolerated wearing them. [facility] in clinic which she reports did not help her pain. Time spent with establishing HEP. Two appointments added for next week so she can continue to progress. She is very motivated. Repeated patella mobs and instruction on self mobs for home. She did well today extending knees in supine. Able to progress HEP. No c/o increased pain.    PT Next Visit Plan  continue quad strengthening. She can lie supine as long as her legs are bent ( she was able to extend them this session) . Consider SAQ, Quad sets, maybe SLR, consider standing exercises, consider modalities. Consider use of cane for gait.    PT Home Exercise Plan  quad set, seated clam with band, self patella mobs, supine heel slides, bridge, SAQ, LAQ    Consulted and Agree with Plan of Care  Patient       Patient will benefit from skilled therapeutic intervention in order to improve the following deficits and impairments:  Abnormal gait, Decreased range of motion, Difficulty walking, Obesity, Decreased endurance, Decreased mobility, Decreased strength, Improper body mechanics, Decreased activity tolerance, Pain  Visit Diagnosis: 1. Chronic pain of left knee   2. Chronic pain of right knee   3. Difficulty in walking, not elsewhere classified  4. Muscle weakness (generalized)        Problem List There are no active problems to display for this patient.   [name], [place] 03/24/2019, 9:53  AM  [facility] [address] [place], [place], [address] Phone: [phone]   Fax:  [phone]  Name: [name] MRN: [id number] Date of Birth: 04-04-1961

## 2019-03-25 DIAGNOSIS — J301 Allergic rhinitis due to pollen: Secondary | ICD-10-CM | POA: Diagnosis not present

## 2019-03-25 DIAGNOSIS — J3081 Allergic rhinitis due to animal (cat) (dog) hair and dander: Secondary | ICD-10-CM | POA: Diagnosis not present

## 2019-03-25 DIAGNOSIS — J3089 Other allergic rhinitis: Secondary | ICD-10-CM | POA: Diagnosis not present

## 2019-03-30 ENCOUNTER — Other Ambulatory Visit: Payer: Self-pay | Admitting: Obstetrics & Gynecology

## 2019-03-30 DIAGNOSIS — N950 Postmenopausal bleeding: Secondary | ICD-10-CM | POA: Diagnosis not present

## 2019-03-30 DIAGNOSIS — D251 Intramural leiomyoma of uterus: Secondary | ICD-10-CM | POA: Diagnosis not present

## 2019-03-31 ENCOUNTER — Ambulatory Visit: Payer: PPO | Admitting: Physical Therapy

## 2019-03-31 ENCOUNTER — Encounter: Payer: Self-pay | Admitting: Physical Therapy

## 2019-03-31 ENCOUNTER — Other Ambulatory Visit: Payer: Self-pay

## 2019-03-31 DIAGNOSIS — R262 Difficulty in walking, not elsewhere classified: Secondary | ICD-10-CM

## 2019-03-31 DIAGNOSIS — G8929 Other chronic pain: Secondary | ICD-10-CM

## 2019-03-31 DIAGNOSIS — M6281 Muscle weakness (generalized): Secondary | ICD-10-CM

## 2019-03-31 DIAGNOSIS — M25562 Pain in left knee: Secondary | ICD-10-CM | POA: Diagnosis not present

## 2019-03-31 NOTE — Patient Instructions (Signed)
       WALKING  Walking is a great form of exercise to increase your strength, endurance and overall fitness.  A walking program can help you start slowly and gradually build endurance as you go.  Everyone's ability is different, so each person's starting point will be different.  You do not have to follow them exactly.  The are just samples. You should simply find out what's right for you and stick to that program.   In the beginning, you'll start off walking 2-3 times a day for short distances.  As you get stronger, you'll be walking further at just 1-2 times per day.  A. You Can Walk For A Certain Length Of Time Each Day This would be good for you  To walk up to 30-45 minutes    Walk 5 minutes 3 times per day.  Increase 2 minutes every 2 days (3 times per day).  Work up to 25-30 minutes (1-2 times per day).   Example:   Day 1-2 5 minutes 3 times per day   Day 7-8 12 minutes 2-3 times per day   Day 13-14 25 minutes 1-2 times per day  B. You Can Walk For a Certain Distance Each Day     Distance can be substituted for time.    Example:   3 trips to mailbox (at road)   3 trips to corner of block   3 trips around the block  C. Go to local [employer] and use the track.    Walk for distance ____ around track  Or time ____ minutes  D. Walk _x___ Jog ____ Run ___  Please only do the exercises that your therapist has initialed and dated  [name], PT Certified Exercise Expert for the Aging Adult  03/31/19 11:45 AM Phone: [phone] Fax: [phone]

## 2019-03-31 NOTE — Therapy (Signed)
[place], [place], [address] Phone: [phone]   Fax:  [phone]  Physical Therapy Treatment  Patient Details  Name: Virginia Luna MRN: [id number] Date of Birth: 1961/05/26 Referring Provider (PT): Dr Eunice Blase    Encounter Date: 03/31/2019  PT End of Session - 03/31/19 1100    Visit Number  3    Number of Visits  12    Date for PT Re-Evaluation  05/01/19    Authorization Type  third party payer    PT Start Time  1101    PT Stop Time  1146    PT Time Calculation (min)  45 min    Activity Tolerance  Patient tolerated treatment well    Behavior During Therapy  [facility] for tasks assessed/performed       Past Medical History:  Diagnosis Date  . Arthritis   . Asthma   . Chronic pain   . Diabetes mellitus without complication ([place])   . Fibromyalgia   . Sleep apnea    did use cpap-lost 25lb-says she does not need it  . Wears contact lenses     Past Surgical History:  Procedure Laterality Date  . BREAST BIOPSY    . BREAST EXCISIONAL BIOPSY    . BREAST LUMPECTOMY WITH RADIOACTIVE SEED LOCALIZATION Bilateral 11/24/2014   Procedure: BILATERAL BREAST LUMPECTOMY WITH RADIOACTIVE SEED LOCALIZATION;  Surgeon: [name], MD;  Location: [facility];  Service: General;  Laterality: Bilateral;  . CHOLECYSTECTOMY    . COLONOSCOPY    . DILATION AND CURETTAGE OF UTERUS    . ESOPHAGOGASTRODUODENOSCOPY (EGD) WITH PROPOFOL N/A 07/23/2016   Procedure: ESOPHAGOGASTRODUODENOSCOPY (EGD) WITH PROPOFOL;  Surgeon: Laurence Spates, MD;  [facility];  Service: Endoscopy;  Laterality: N/A;  . ESOPHAGOGASTRODUODENOSCOPY (EGD) WITH PROPOFOL N/A 06/11/2018   Procedure: ESOPHAGOGASTRODUODENOSCOPY (EGD) WITH PROPOFOL;  Surgeon: Laurence Spates, MD;  [facility];  Service: Endoscopy;  Laterality: N/A;  . LUMBAR LAMINECTOMY  2010  . UMBILICAL HERNIA REPAIR     age 8  . UPPER GI ENDOSCOPY       There were no vitals filed for this visit.  Subjective Assessment - 03/31/19 1104    Subjective  Both my knees hurt but my left is the worst. both about 5/10    Limitations  Lifting;Standing    How long can you sit comfortably?  Sitting for too long stiffens her knees    How long can you walk comfortably?  Can walk around the grocery store but has to lean on a cart.    Diagnostic tests  X-ray but results arent in the computer; Per patient bone spurs and arthritis.    Currently in Pain?  Yes    Pain Score  5     Pain Location  Knee    Pain Orientation  Left    Pain Descriptors / Indicators  Aching;Dull    Pain Type  Chronic pain    Pain Onset  More than a month ago    Multiple Pain Sites  Yes    Pain Score  5    Pain Location  Knee                       [facility] Adult PT Treatment/Exercise - 03/31/19 1129      Self-Care   Self-Care  Other Self-Care Comments    Other Self-Care Comments   walking program, sleep hygiene   utilizing time starting  with 5 min 3 times a day     Exercises   Exercises  Knee/Hip      Knee/Hip Exercises: Stretches   Active Hamstring Stretch Limitations  seated hamstring stretch 3x20 sec hold    3 deep breaths each side.    Other Knee/Hip Stretches  seated piriformis stretch 3 x 20 right and left      Knee/Hip Exercises: Standing   Forward Step Up  1 set;Hand Hold: 1;Step Height: 4"    Other Standing Knee Exercises  Squat with chair/sink, 2 x 10 with cues,     Other Standing Knee Exercises  standing partial lunge forward, side and backward on left 2 x 10 and then on right 2 x 10      Knee/Hip Exercises: Seated   Other Seated Knee/Hip Exercises  seated leg press with green t band resistance 2 x 10      Knee/Hip Exercises: Supine   Hip Adduction Isometric  10 reps    Bridges  10 reps      Knee/Hip Exercises: Sidelying   Hip ABduction  10 reps;1 set    Hip ABduction Limitations  bil with green t band             PT  Education - 03/31/19 1158    Education Details  added to HEP for standing, body weight exercise and home walking program    Person(s) Educated  Patient    Methods  Explanation;Demonstration;Tactile cues;Verbal cues;Handout    Comprehension  Verbalized understanding;Returned demonstration       PT Short Term Goals - 03/20/19 0801      PT SHORT TERM GOAL #1   Title  Patient will be indepdnent with basic HEP    Time  3    Period  Weeks    Status  New    Target Date  04/10/19      PT SHORT TERM GOAL #2   Title  Patient will demonstrate a 50% improvement in patella movement    Time  3    Period  Weeks    Status  New    Target Date  04/10/19      PT SHORT TERM GOAL #3   Title  Patient will extend bilateral LE without increased pain    Time  3    Period  Weeks    Status  New    Target Date  04/10/19        PT Long Term Goals - 03/20/19 0802      PT LONG TERM GOAL #1   Title  Patient will transfer sit to stand independently without use of hands and without increased pain    Time  6    Period  Weeks    Status  New    Target Date  05/01/19      PT LONG TERM GOAL #2   Title  Patient will stand for 10 minutes without increased pain in order to perfrom ADL's    Time  6    Period  Weeks    Status  New    Target Date  05/01/19      PT LONG TERM GOAL #3   Title  Patient will ambualte 150' without increased pain with LRAD in order to improve ability to go to appointments    Time  6    Period  Weeks    Status  New    Target Date  05/01/19  Plan - 03/31/19 1202    Clinical Impression Statement  Pt arrived with antalgic gait and refusing to use a cane says she prefers not to use a cane.  Pt with 5/10 pain improved from 8/10 pain on eval.  Pt was introduced to more body weight exericise and home walking program beginning with 5 min x 3. Pt will continue with HEP with added exericses. Pt motivated to improve and needs hip strengthening as well as knee,     Personal Factors and Comorbidities  Comorbidity 1;Comorbidity 2;Comorbidity 3+    Comorbidities  fibromaylgia, anxiety, obesity    Examination-Activity Limitations  Transfers;Locomotion Level;Sit;Sleep;Squat;Stairs;Stand    Rehab Potential  Good    PT Frequency  2x / week    PT Duration  6 weeks    PT Treatment/Interventions  ADLs/Self Care Home Management;Cryotherapy;Electrical Stimulation;Moist Heat;Traction;Ultrasound;DME Instruction;Stair training;Functional mobility training;Therapeutic activities;Therapeutic exercise;Gait training;Neuromuscular re-education;Patient/family education;Passive range of motion;Manual techniques;Taping;Spinal Manipulations;Joint Manipulations    PT Next Visit Plan  Review exercises.  add hip strength/ knee strength as needed, try SLR,    PT Home Exercise Plan  quad set, seated clam with band, self patella mobs, supine heel slides, bridge, SAQ, LAQ sink squat, mini lunge forward, side and backward with UE support.    Consulted and Agree with Plan of Care  Patient       Patient will benefit from skilled therapeutic intervention in order to improve the following deficits and impairments:  Abnormal gait, Decreased range of motion, Difficulty walking, Obesity, Decreased endurance, Decreased mobility, Decreased strength, Improper body mechanics, Decreased activity tolerance, Pain  Visit Diagnosis: 1. Chronic pain of left knee   2. Chronic pain of right knee   3. Difficulty in walking, not elsewhere classified   4. Muscle weakness (generalized)        Problem List There are no active problems to display for this patient.  [name], PT Certified Exercise Expert for the Aging Adult  03/31/19 12:08 PM Phone: [phone] Fax: [phone]  [facility] [address] [place], [place], [address] Phone: [phone]   Fax:  [phone]  Name: Virginia Luna MRN: [id number] Date of Birth:  May 12, 1961

## 2019-04-01 DIAGNOSIS — J3089 Other allergic rhinitis: Secondary | ICD-10-CM | POA: Diagnosis not present

## 2019-04-01 DIAGNOSIS — J3081 Allergic rhinitis due to animal (cat) (dog) hair and dander: Secondary | ICD-10-CM | POA: Diagnosis not present

## 2019-04-01 DIAGNOSIS — J301 Allergic rhinitis due to pollen: Secondary | ICD-10-CM | POA: Diagnosis not present

## 2019-04-03 ENCOUNTER — Encounter: Payer: Self-pay | Admitting: Physical Therapy

## 2019-04-03 ENCOUNTER — Ambulatory Visit: Payer: PPO | Admitting: Physical Therapy

## 2019-04-03 ENCOUNTER — Other Ambulatory Visit: Payer: Self-pay

## 2019-04-03 DIAGNOSIS — M25562 Pain in left knee: Secondary | ICD-10-CM

## 2019-04-03 DIAGNOSIS — R262 Difficulty in walking, not elsewhere classified: Secondary | ICD-10-CM

## 2019-04-03 DIAGNOSIS — G8929 Other chronic pain: Secondary | ICD-10-CM

## 2019-04-03 DIAGNOSIS — M6281 Muscle weakness (generalized): Secondary | ICD-10-CM

## 2019-04-03 NOTE — Therapy (Signed)
[place], [place], [address] Phone: [phone]   Fax:  [phone]  Physical Therapy Treatment  Patient Details  Name: Virginia Luna MRN: [id number] Date of Birth: 26-Apr-1961 Referring Provider (PT): Dr Eunice Blase    Encounter Date: 04/03/2019  PT End of Session - 04/03/19 1201    Visit Number  4    Number of Visits  12    Date for PT Re-Evaluation  05/01/19    Authorization Type  third party payer    PT Start Time  1102    PT Stop Time  1146    PT Time Calculation (min)  44 min    Activity Tolerance  Patient tolerated treatment well    Behavior During Therapy  [facility] for tasks assessed/performed       Past Medical History:  Diagnosis Date  . Arthritis   . Asthma   . Chronic pain   . Diabetes mellitus without complication ([place])   . Fibromyalgia   . Sleep apnea    did use cpap-lost 25lb-says she does not need it  . Wears contact lenses     Past Surgical History:  Procedure Laterality Date  . BREAST BIOPSY    . BREAST EXCISIONAL BIOPSY    . BREAST LUMPECTOMY WITH RADIOACTIVE SEED LOCALIZATION Bilateral 11/24/2014   Procedure: BILATERAL BREAST LUMPECTOMY WITH RADIOACTIVE SEED LOCALIZATION;  Surgeon: [name], MD;  Location: [facility];  Service: General;  Laterality: Bilateral;  . CHOLECYSTECTOMY    . COLONOSCOPY    . DILATION AND CURETTAGE OF UTERUS    . ESOPHAGOGASTRODUODENOSCOPY (EGD) WITH PROPOFOL N/A 07/23/2016   Procedure: ESOPHAGOGASTRODUODENOSCOPY (EGD) WITH PROPOFOL;  Surgeon: Laurence Spates, MD;  [facility];  Service: Endoscopy;  Laterality: N/A;  . ESOPHAGOGASTRODUODENOSCOPY (EGD) WITH PROPOFOL N/A 06/11/2018   Procedure: ESOPHAGOGASTRODUODENOSCOPY (EGD) WITH PROPOFOL;  Surgeon: Laurence Spates, MD;  [facility];  Service: Endoscopy;  Laterality: N/A;  . LUMBAR LAMINECTOMY  2010  . UMBILICAL HERNIA REPAIR     age 4  . UPPER GI ENDOSCOPY       There were no vitals filed for this visit.  Subjective Assessment - 04/03/19 1102    Subjective  Both my knees hurt about 5/10  after Rx  about a 4.5/10    Limitations  Lifting;Standing    How long can you sit comfortably?  Sitting for too long stiffens her knees    How long can you walk comfortably?  trying to walk 5 min 3 times a day    Currently in Pain?  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      Knee/Hip Exercises: Standing   Forward Step Up  1 set;Hand Hold: 1;Step Height: 4";10 reps    Other Standing Knee Exercises  Squat with chair/sink, 2 x 10 with cues, Standing hip hinge education and return demo using dowel. 1lx 10 and 2 x 10 external cue with counter.   deadlift on 4 in step 2 x 10 with 15 # and then 10x on 10 inch platform.   then 30 # deadlift from 10 inch step cues for proper form     Other Standing Knee Exercises  standing partial lunge forward, side and backward on left 2 x 10 and then on right 2 x 10      Knee/Hip Exercises: Seated   Long Arc Quad  Right;Left;10 reps    Knee/Hip Flexion  seated ham curls, 2 x 10 right and left    Other Seated Knee/Hip Exercises  seated leg press with green t band resistance 2 x 10      Knee/Hip Exercises: Sidelying   Hip ABduction  10 reps;1 set    Hip ABduction Limitations  bil with green t band       Manual Therapy   Manual therapy comments  patella mobs    education of self patellar mobs bil            PT Education - 04/03/19 1144    Education Details  Added hip hinge and deallifts to HEP  discussed and educated on patellar mobs    Person(s) Educated  Patient    Methods  Explanation;Demonstration;Tactile cues;Verbal cues;Handout    Comprehension  Verbalized understanding;Returned demonstration       PT Short Term Goals - 04/03/19 1110      PT SHORT TERM GOAL #1   Title  Patient will be indepenent with basic HEP    Baseline  Pt given Intiial HEP    Time  3    Period  Weeks    Status  On-going      PT SHORT TERM GOAL #2   Title  Patient will demonstrate a 50% improvement in patella movement    Baseline  Pt shown self patellar mobilization today    Time  3    Period  Weeks    Status  On-going      PT SHORT TERM GOAL #3   Title  Patient will extend bilateral LE without increased pain    Baseline  Pt states pain is 4.5 today after patellar mobs    Time  3    Period  Weeks    Status  On-going        PT Long Term Goals - 03/20/19 0802      PT LONG TERM GOAL #1   Title  Patient will transfer sit to stand independently without use of hands and without increased pain    Time  6    Period  Weeks    Status  New    Target Date  05/01/19      PT LONG TERM GOAL #2   Title  Patient will stand for 10 minutes without increased pain in order to perfrom ADL's    Time  6    Period  Weeks    Status  New    Target Date  05/01/19      PT LONG TERM GOAL #3   Title  Patient will  ambualte 150' without increased pain with LRAD in order to improve ability to go to appointments    Time  6    Period  Weeks    Status  New    Target Date  05/01/19            Plan - 04/03/19 1201    Clinical Impression Statement  Pt arrived with antalgic gait and 5/10 pain bilaterally.  Pt after RX was 4.5/10 after movement and patellar mobs.  Pt did seem to participate in standing  exercises with more fluidity of movement than previous visit.  Pt states she is trying to walk 5 minutes 3 x a day.and improve time to eventually tolerate 15 to 30 minutes at one time.  She stated she was goint to try to go to a shopping event in [place] this weekend.  Will continue to progress toward goals    Personal Factors and Comorbidities  Comorbidity 1;Comorbidity 2;Comorbidity 3+    Comorbidities  fibromaylgia, anxiety, obesity    Examination-Activity Limitations  Transfers;Locomotion Level;Sit;Sleep;Squat;Stairs;Stand    Examination-Participation Restrictions  Community Activity;Cleaning;Laundry    Rehab Potential  Good    PT Frequency  2x / week    PT Duration  6 weeks    PT Treatment/Interventions  ADLs/Self Care Home Management;Cryotherapy;Electrical Stimulation;Moist Heat;Traction;Ultrasound;DME Instruction;Stair training;Functional mobility training;Therapeutic activities;Therapeutic exercise;Gait training;Neuromuscular re-education;Patient/family education;Passive range of motion;Manual techniques;Taping;Spinal Manipulations;Joint Manipulations    PT Next Visit Plan  Review exercises.  add hip strength/ knee strength as needed, try SLR, added deadlift and hip hinge to HEP    PT Home Exercise Plan  quad set, seated clam with band, self patella mobs, supine heel slides, bridge, SAQ, LAQ sink squat, mini lunge forward, side and backward with UE support. hip hinge and deadlift on raised step    Consulted and Agree with Plan of Care  Patient       Patient will benefit from skilled therapeutic intervention in order to improve the following deficits and impairments:  Abnormal gait, Decreased range of motion, Difficulty walking, Obesity, Decreased endurance, Decreased mobility, Decreased strength, Improper body mechanics, Decreased activity tolerance, Pain  Visit Diagnosis: 1. Chronic pain of left knee   2. Difficulty in walking, not elsewhere classified   3. Chronic pain of right knee    4. Muscle weakness (generalized)        Problem List There are no active problems to display for this patient.   [name], PT Certified Exercise Expert for the Aging Adult  04/03/19 12:11 PM Phone: [phone] Fax: [facility] [address] [place], [place], [address] Phone: [phone]   Fax:  [phone]  Name: Virginia Luna MRN: [id number] Date of Birth: 08-25-1960

## 2019-04-03 NOTE — Patient Instructions (Signed)
     [name], PT Certified Exercise Expert for the Aging Adult  04/03/19 11:44 AM Phone: [phone] Fax: [phone]

## 2019-04-07 ENCOUNTER — Encounter: Payer: Self-pay | Admitting: Physical Therapy

## 2019-04-07 ENCOUNTER — Ambulatory Visit: Payer: PPO | Admitting: Physical Therapy

## 2019-04-07 ENCOUNTER — Other Ambulatory Visit: Payer: Self-pay

## 2019-04-07 DIAGNOSIS — M25562 Pain in left knee: Secondary | ICD-10-CM | POA: Diagnosis not present

## 2019-04-07 DIAGNOSIS — M25561 Pain in right knee: Secondary | ICD-10-CM

## 2019-04-07 DIAGNOSIS — G8929 Other chronic pain: Secondary | ICD-10-CM

## 2019-04-07 DIAGNOSIS — M6281 Muscle weakness (generalized): Secondary | ICD-10-CM

## 2019-04-07 DIAGNOSIS — R262 Difficulty in walking, not elsewhere classified: Secondary | ICD-10-CM

## 2019-04-08 ENCOUNTER — Encounter: Payer: Self-pay | Admitting: Physical Therapy

## 2019-04-08 DIAGNOSIS — J3081 Allergic rhinitis due to animal (cat) (dog) hair and dander: Secondary | ICD-10-CM | POA: Diagnosis not present

## 2019-04-08 DIAGNOSIS — J3089 Other allergic rhinitis: Secondary | ICD-10-CM | POA: Diagnosis not present

## 2019-04-08 DIAGNOSIS — J301 Allergic rhinitis due to pollen: Secondary | ICD-10-CM | POA: Diagnosis not present

## 2019-04-08 NOTE — Therapy (Signed)
[place], [place], [address] Phone: [phone]   Fax:  [phone]  Physical Therapy Treatment  Patient Details  Name: Virginia Luna MRN: [id number] Date of Birth: 08-Oct-1960 Referring Provider (PT): Dr Eunice Blase    Encounter Date: 04/07/2019  PT End of Session - 04/07/19 1339    Visit Number  5    Number of Visits  12    Date for PT Re-Evaluation  05/01/19    Authorization Type  third party payer    PT Start Time  1330    PT Stop Time  1423    PT Time Calculation (min)  53 min    Activity Tolerance  Patient tolerated treatment well    Behavior During Therapy  [facility] for tasks assessed/performed       Past Medical History:  Diagnosis Date  . Arthritis   . Asthma   . Chronic pain   . Diabetes mellitus without complication ([place])   . Fibromyalgia   . Sleep apnea    did use cpap-lost 25lb-says she does not need it  . Wears contact lenses     Past Surgical History:  Procedure Laterality Date  . BREAST BIOPSY    . BREAST EXCISIONAL BIOPSY    . BREAST LUMPECTOMY WITH RADIOACTIVE SEED LOCALIZATION Bilateral 11/24/2014   Procedure: BILATERAL BREAST LUMPECTOMY WITH RADIOACTIVE SEED LOCALIZATION;  Surgeon: [name], MD;  Location: [facility];  Service: General;  Laterality: Bilateral;  . CHOLECYSTECTOMY    . COLONOSCOPY    . DILATION AND CURETTAGE OF UTERUS    . ESOPHAGOGASTRODUODENOSCOPY (EGD) WITH PROPOFOL N/A 07/23/2016   Procedure: ESOPHAGOGASTRODUODENOSCOPY (EGD) WITH PROPOFOL;  Surgeon: Laurence Spates, MD;  Location: [facility];  Service: Endoscopy;  Laterality: N/A;  . ESOPHAGOGASTRODUODENOSCOPY (EGD) WITH PROPOFOL N/A 06/11/2018   Procedure: ESOPHAGOGASTRODUODENOSCOPY (EGD) WITH PROPOFOL;  Surgeon: Laurence Spates, MD;  Location: [facility];  Service: Endoscopy;  Laterality: N/A;  . LUMBAR LAMINECTOMY  2010  . UMBILICAL HERNIA REPAIR     age 21  . UPPER GI ENDOSCOPY       There were no vitals filed for this visit.  Subjective Assessment - 04/07/19 1336    Subjective  Patient reports her knees are sore today but she feels like overall she is moving a little better.    Limitations  Lifting;Standing    How long can you sit comfortably?  Sitting for too long stiffens her knees    How long can you stand comfortably?  varies how long she can stand    How long can you walk comfortably?  trying to walk 5 min 3 times a day    Diagnostic tests  X-ray but results arent in the computer; Per patient bone spurs and arthritis.    Currently in Pain?  Yes    Pain Score  6     Pain Location  Knee    Pain Orientation  Left    Pain Descriptors / Indicators  Aching    Pain Type  Chronic pain    Pain Onset  More than a month ago    Pain Frequency  Intermittent    Aggravating Factors   standing and walking    Pain Relieving Factors  rest and ice    Effect of Pain on Daily Activities  difficulty walking    Pain Score  5    Pain Location  Knee    Pain Orientation  Right    Pain  Descriptors / Indicators  Aching    Pain Type  Chronic pain    Pain Onset  More than a month ago    Pain Frequency  Constant    Aggravating Factors   standing and walking    Pain Relieving Factors  rest    Effect of Pain on Daily Activities  difficulty perfroming ADL's                       [facility] Adult PT Treatment/Exercise - 04/08/19 0001      Knee/Hip Exercises: Stretches   Active Hamstring Stretch Limitations  seated hamstring stretch 3x20 sec hold    3 deep breaths each side.      Knee/Hip Exercises: Standing   Other Standing Knee Exercises  Squat with chair/sink, 2 x 10 with cues, Standing hip hinge education and return demo using dowel. 1lx 10 and 2 x 10 external cue with counter.   deadlift on 4 in step 2 x 10 with 15 # and then 10x on 10 inch platform.   then 30 # deadlift from 10 inch step cues for proper form     Other Standing Knee Exercises  standing partial  lunge forward, side and backward on left 2 x 10 and then on right 2 x 10      Knee/Hip Exercises: Seated   Long Arc Quad  Right;Left;10 reps    Knee/Hip Flexion  seated ham curls, 2 x 10 right and left      Knee/Hip Exercises: Supine   Quad Sets  Both    Quad Sets Limitations  2x10    Short Arc [employer]  Both    Short Arc Quad Sets Limitations  2x10    Bridges  10 reps    Other Supine Knee/Hip Exercises  supine clam shell green 2x10       Knee/Hip Exercises: Sidelying   Hip ABduction  10 reps;1 set    Hip ABduction Limitations  bil with green t band      Modalities   Modalities  Cryotherapy      Cryotherapy   Number Minutes Cryotherapy  10 Minutes    Cryotherapy Location  Knee   posterior knee in sitting    Type of Cryotherapy  Ice pack      Manual Therapy   Manual therapy comments  patella mobs    education of self patellar mobs bil            PT Education - 04/07/19 1338    Education Details  reviewed HEp and patella mobilizations    Person(s) Educated  Patient    Methods  Explanation;Demonstration;Tactile cues;Verbal cues    Comprehension  Verbalized understanding;Returned demonstration;Verbal cues required;Tactile cues required       PT Short Term Goals - 04/03/19 1110      PT SHORT TERM GOAL #1   Title  Patient will be indepenent with basic HEP    Baseline  Pt given Intiial HEP    Time  3    Period  Weeks    Status  On-going      PT SHORT TERM GOAL #2   Title  Patient will demonstrate a 50% improvement in patella movement    Baseline  Pt shown self patellar mobilization today    Time  3    Period  Weeks    Status  On-going      PT SHORT TERM GOAL #3   Title  Patient will  extend bilateral LE without increased pain    Baseline  Pt states pain is 4.5 today after patellar mobs    Time  3    Period  Weeks    Status  On-going        PT Long Term Goals - 03/20/19 0802      PT LONG TERM GOAL #1   Title  Patient will transfer sit to stand  independently without use of hands and without increased pain    Time  6    Period  Weeks    Status  New    Target Date  05/01/19      PT LONG TERM GOAL #2   Title  Patient will stand for 10 minutes without increased pain in order to perfrom ADL's    Time  6    Period  Weeks    Status  New    Target Date  05/01/19      PT LONG TERM GOAL #3   Title  Patient will ambualte 150' without increased pain with LRAD in order to improve ability to go to appointments    Time  6    Period  Weeks    Status  New    Target Date  05/01/19            Plan - 04/07/19 1402    Clinical Impression Statement  Patient is making progress. She reported some soreness with exercises but she can perfrom fucntioal movmenets. She was sore after the lsast visit but it improved between last visit and this visit. Patients bilateral patellas are beggining to move more. She remains motivated to get stronger.    Personal Factors and Comorbidities  Comorbidity 1;Comorbidity 2;Comorbidity 3+    Comorbidities  fibromaylgia, anxiety, obesity    Examination-Activity Limitations  Transfers;Locomotion Level;Sit;Sleep;Squat;Stairs;Stand    Clinical Decision Making  Moderate    Rehab Potential  Good    PT Frequency  2x / week    PT Duration  6 weeks    PT Treatment/Interventions  ADLs/Self Care Home Management;Cryotherapy;Electrical Stimulation;Moist Heat;Traction;Ultrasound;DME Instruction;Stair training;Functional mobility training;Therapeutic activities;Therapeutic exercise;Gait training;Neuromuscular re-education;Patient/family education;Passive range of motion;Manual techniques;Taping;Spinal Manipulations;Joint Manipulations    PT Next Visit Plan  Review exercises.  add hip strength/ knee strength as needed, try SLR, added deadlift and hip hinge to HEP    PT Home Exercise Plan  quad set, seated clam with band, self patella mobs, supine heel slides, bridge, SAQ, LAQ sink squat, mini lunge forward, side and backward  with UE support. hip hinge and deadlift on raised step    Consulted and Agree with Plan of Care  Patient       Patient will benefit from skilled therapeutic intervention in order to improve the following deficits and impairments:  Abnormal gait, Decreased range of motion, Difficulty walking, Obesity, Decreased endurance, Decreased mobility, Decreased strength, Improper body mechanics, Decreased activity tolerance, Pain  Visit Diagnosis: 1. Chronic pain of left knee   2. Difficulty in walking, not elsewhere classified   3. Chronic pain of right knee   4. Muscle weakness (generalized)        Problem List There are no active problems to display for this patient.   Carney Living PT DPT  04/08/2019, 1:31 PM  [facility] [address] [place], [place], [address] Phone: [phone]   Fax:  [phone]  Name: Virginia Luna MRN: [id number] Date of Birth: Jun 04, 1961

## 2019-04-09 ENCOUNTER — Ambulatory Visit: Payer: PPO | Admitting: Physical Therapy

## 2019-04-09 ENCOUNTER — Encounter: Payer: Self-pay | Admitting: Physical Therapy

## 2019-04-09 ENCOUNTER — Other Ambulatory Visit: Payer: Self-pay

## 2019-04-09 DIAGNOSIS — R262 Difficulty in walking, not elsewhere classified: Secondary | ICD-10-CM

## 2019-04-09 DIAGNOSIS — G8929 Other chronic pain: Secondary | ICD-10-CM

## 2019-04-09 DIAGNOSIS — M25562 Pain in left knee: Secondary | ICD-10-CM | POA: Diagnosis not present

## 2019-04-09 DIAGNOSIS — M6281 Muscle weakness (generalized): Secondary | ICD-10-CM

## 2019-04-09 DIAGNOSIS — M25561 Pain in right knee: Secondary | ICD-10-CM

## 2019-04-10 ENCOUNTER — Encounter: Payer: Self-pay | Admitting: Physical Therapy

## 2019-04-10 NOTE — Therapy (Signed)
[place], [place], [address] Phone: [phone]   Fax:  [phone]  Physical Therapy Treatment  Patient Details  Name: Virginia Luna MRN: [id number] Date of Birth: 04/27/1961 Referring Provider (PT): Dr Eunice Blase    Encounter Date: 04/09/2019  PT End of Session - 04/09/19 1340    Visit Number  6    Number of Visits  12    Date for PT Re-Evaluation  05/01/19    Authorization Type  third party payer    PT Start Time  1332    PT Stop Time  1413    PT Time Calculation (min)  41 min    Activity Tolerance  Patient tolerated treatment well    Behavior During Therapy  [facility] for tasks assessed/performed       Past Medical History:  Diagnosis Date  . Arthritis   . Asthma   . Chronic pain   . Diabetes mellitus without complication ([place])   . Fibromyalgia   . Sleep apnea    did use cpap-lost 25lb-says she does not need it  . Wears contact lenses     Past Surgical History:  Procedure Laterality Date  . BREAST BIOPSY    . BREAST EXCISIONAL BIOPSY    . BREAST LUMPECTOMY WITH RADIOACTIVE SEED LOCALIZATION Bilateral 11/24/2014   Procedure: BILATERAL BREAST LUMPECTOMY WITH RADIOACTIVE SEED LOCALIZATION;  Surgeon: [name], MD;  Location: [place];  Service: General;  Laterality: Bilateral;  . CHOLECYSTECTOMY    . COLONOSCOPY    . DILATION AND CURETTAGE OF UTERUS    . ESOPHAGOGASTRODUODENOSCOPY (EGD) WITH PROPOFOL N/A 07/23/2016   Procedure: ESOPHAGOGASTRODUODENOSCOPY (EGD) WITH PROPOFOL;  Surgeon: Laurence Spates, MD;  [facility];  Service: Endoscopy;  Laterality: N/A;  . ESOPHAGOGASTRODUODENOSCOPY (EGD) WITH PROPOFOL N/A 06/11/2018   Procedure: ESOPHAGOGASTRODUODENOSCOPY (EGD) WITH PROPOFOL;  Surgeon: Laurence Spates, MD;  [facility]: [facility];  Service: Endoscopy;  Laterality: N/A;  . LUMBAR LAMINECTOMY  2010  . UMBILICAL HERNIA REPAIR     age 58  . UPPER GI ENDOSCOPY       There were no vitals filed for this visit.  Subjective Assessment - 04/09/19 1337    Subjective  Patient is sore today. She reports she was hurting after the session yesterday. She reports she was able to tolerate the pain though.    Limitations  Lifting;Standing    How long can you sit comfortably?  Sitting for too long stiffens her knees    How long can you stand comfortably?  varies how long she can stand    How long can you walk comfortably?  trying to walk 5 min 3 times a day    Diagnostic tests  X-ray but results arent in the computer; Per patient bone spurs and arthritis.    Currently in Pain?  Yes    Pain Score  8     Pain Location  Knee    Pain Orientation  Left    Pain Descriptors / Indicators  Aching    Pain Type  Chronic pain    Pain Radiating Towards  pain shooting up the leg with light touch    Pain Onset  More than a month ago    Pain Frequency  Intermittent    Aggravating Factors   standing and walking    Pain Relieving Factors  rest and ice    Effect of Pain on Daily Activities  difficulty walking    Pain Score  6    Pain Location  Knee    Pain Orientation  Right    Pain Descriptors / Indicators  Aching    Pain Type  Chronic pain    Pain Onset  More than a month ago    Pain Frequency  Constant    Aggravating Factors   standing and walking    Pain Relieving Factors  rest    Effect of Pain on Daily Activities  difficulty perfroming ADL's                       [facility] Adult PT Treatment/Exercise - 04/10/19 0001      Knee/Hip Exercises: Stretches   Active Hamstring Stretch Limitations  seated hamstring stretch 3x20 sec hold    3 deep breaths each side.      Knee/Hip Exercises: Aerobic   Nustep  5 min L2 cuing for pace       Knee/Hip Exercises: Seated   Long Arc Quad  Right;Left;10 reps    Long Arc Quad Limitations  90-45    Knee/Hip Flexion  seated ham curls, 2 x 10 right and left    Other Seated Knee/Hip Exercises  seated clamshell 2x10        Knee/Hip Exercises: Supine   Quad Sets  Both    Quad Sets Limitations  2x10    Short Arc [employer]  Both    Short Arc Quad Sets Limitations  2x10    Bridges  10 reps      Modalities   Modalities  Ultrasound      Ultrasound   Ultrasound Location  posterior left knee     Ultrasound Parameters  50% 68mhz 1.5 w/cm2     Ultrasound Goals  Pain;Edema      Manual Therapy   Manual Therapy  Soft tissue mobilization;Passive ROM    Manual therapy comments  patella mobs    education of self patellar mobs bil   Soft tissue mobilization  to posterior knee     Passive ROM  into knee extension              PT Education - 04/09/19 1340    Education Details  impotance of exercising    Person(s) Educated  Patient    Methods  Explanation;Demonstration;Tactile cues;Verbal cues    Comprehension  Verbalized understanding;Returned demonstration;Verbal cues required;Tactile cues required       PT Short Term Goals - 04/10/19 0911      PT SHORT TERM GOAL #1   Title  Patient will be indepenent with basic HEP    Baseline  Pt given Intiial HEP    Time  3    Period  Weeks    Status  On-going    Target Date  04/10/19      PT SHORT TERM GOAL #2   Title  Patient will demonstrate a 50% improvement in patella movement    Baseline  Pt shown self patellar mobilization today    Time  3    Period  Weeks    Status  On-going      PT SHORT TERM GOAL #3   Title  Patient will extend bilateral LE without increased pain    Baseline  Pt states pain is 4.5 today after patellar mobs    Time  3    Period  Weeks    Status  On-going    Target Date  04/10/19        PT Long  Term Goals - 03/20/19 0802      PT LONG TERM GOAL #1   Title  Patient will transfer sit to stand independently without use of hands and without increased pain    Time  6    Period  Weeks    Status  New    Target Date  05/01/19      PT LONG TERM GOAL #2   Title  Patient will stand for 10 minutes without increased pain in  order to perfrom ADL's    Time  6    Period  Weeks    Status  New    Target Date  05/01/19      PT LONG TERM GOAL #3   Title  Patient will ambualte 150' without increased pain with LRAD in order to improve ability to go to appointments    Time  6    Period  Weeks    Status  New    Target Date  05/01/19            Plan - 04/10/19 0905    Clinical Impression Statement  Therapy focused on stretching and modalities to reduce pain. She is still lacking full knee extension on the left. She has increased pain with extension stretching. she likes to keep a pillow under her knee at home. She continues to have pockets of swelling in her posterior knee.    Comorbidities  fibromaylgia, anxiety, obesity    Examination-Activity Limitations  Transfers;Locomotion Level;Sit;Sleep;Squat;Stairs;Stand    Examination-Participation Restrictions  Community Activity;Cleaning;Laundry    Clinical Decision Making  Moderate    Rehab Potential  Good    PT Frequency  2x / week    PT Duration  6 weeks    PT Treatment/Interventions  ADLs/Self Care Home Management;Cryotherapy;Electrical Stimulation;Moist Heat;Traction;Ultrasound;DME Instruction;Stair training;Functional mobility training;Therapeutic activities;Therapeutic exercise;Gait training;Neuromuscular re-education;Patient/family education;Passive range of motion;Manual techniques;Taping;Spinal Manipulations;Joint Manipulations    PT Next Visit Plan  Review exercises.  add hip strength/ knee strength as needed, try SLR, added deadlift and hip hinge to HEP    PT Home Exercise Plan  quad set, seated clam with band, self patella mobs, supine heel slides, bridge, SAQ, LAQ sink squat, mini lunge forward, side and backward with UE support. hip hinge and deadlift on raised step    Consulted and Agree with Plan of Care  Patient       Patient will benefit from skilled therapeutic intervention in order to improve the following deficits and impairments:  Abnormal  gait, Decreased range of motion, Difficulty walking, Obesity, Decreased endurance, Decreased mobility, Decreased strength, Improper body mechanics, Decreased activity tolerance, Pain  Visit Diagnosis: Chronic pain of left knee  Difficulty in walking, not elsewhere classified  Chronic pain of right knee  Muscle weakness (generalized)     Problem List There are no active problems to display for this patient.   Carney Living PT DPT  04/10/2019, 9:15 AM  [facility] [address] [place], [place], [address] Phone: [phone]   Fax:  [phone]  Name: Virginia Luna MRN: [id number] Date of Birth: 10-30-1960

## 2019-04-14 ENCOUNTER — Ambulatory Visit: Payer: PPO | Admitting: Physical Therapy

## 2019-04-14 ENCOUNTER — Encounter: Payer: Self-pay | Admitting: Physical Therapy

## 2019-04-14 ENCOUNTER — Other Ambulatory Visit: Payer: Self-pay

## 2019-04-14 DIAGNOSIS — R262 Difficulty in walking, not elsewhere classified: Secondary | ICD-10-CM

## 2019-04-14 DIAGNOSIS — G8929 Other chronic pain: Secondary | ICD-10-CM

## 2019-04-14 DIAGNOSIS — M25562 Pain in left knee: Secondary | ICD-10-CM | POA: Diagnosis not present

## 2019-04-14 DIAGNOSIS — M6281 Muscle weakness (generalized): Secondary | ICD-10-CM

## 2019-04-14 NOTE — Therapy (Signed)
[place], [place], [address] Phone: [phone]   Fax:  [phone]  Physical Therapy Treatment  Patient Details  Name: TANGANIKA MEINEKE MRN: [id number] Date of Birth: [birth year] Referring Provider (PT): Dr Eunice Blase    Encounter Date: 04/14/2019  PT End of Session - 04/14/19 1343    Visit Number  7    Number of Visits  12    Date for PT Re-Evaluation  05/01/19    Authorization Type  third party payer    PT Start Time  1336   Patient was 6 minutes late to appointment   PT Stop Time  1415    PT Time Calculation (min)  39 min    Activity Tolerance  Patient tolerated treatment well    Behavior During Therapy  [facility] for tasks assessed/performed       Past Medical History:  Diagnosis Date  . Arthritis   . Asthma   . Chronic pain   . Diabetes mellitus without complication ([place])   . Fibromyalgia   . Sleep apnea    did use cpap-lost 25lb-says she does not need it  . Wears contact lenses     Past Surgical History:  Procedure Laterality Date  . BREAST BIOPSY    . BREAST EXCISIONAL BIOPSY    . BREAST LUMPECTOMY WITH RADIOACTIVE SEED LOCALIZATION Bilateral 11/24/2014   Procedure: BILATERAL BREAST LUMPECTOMY WITH RADIOACTIVE SEED LOCALIZATION;  Surgeon: [name], MD;  Location: [place];  Service: General;  Laterality: Bilateral;  . CHOLECYSTECTOMY    . COLONOSCOPY    . DILATION AND CURETTAGE OF UTERUS    . ESOPHAGOGASTRODUODENOSCOPY (EGD) WITH PROPOFOL N/A 07/23/2016   Procedure: ESOPHAGOGASTRODUODENOSCOPY (EGD) WITH PROPOFOL;  Surgeon: Laurence Spates, MD;  Location: [facility];  Service: Endoscopy;  Laterality: N/A;  . ESOPHAGOGASTRODUODENOSCOPY (EGD) WITH PROPOFOL N/A 06/11/2018   Procedure: ESOPHAGOGASTRODUODENOSCOPY (EGD) WITH PROPOFOL;  Surgeon: Laurence Spates, MD;  Location: [facility];  Service: Endoscopy;  Laterality: N/A;  . LUMBAR LAMINECTOMY  2010  . UMBILICAL  HERNIA REPAIR     age 7  . UPPER GI ENDOSCOPY      There were no vitals filed for this visit.  Subjective Assessment - 04/14/19 1341    Subjective  Patient reports her left knee is very sore today. Her pain is around a 9/10. It is hurting so bad she is getting a headache.    Limitations  Standing    How long can you stand comfortably?  varies how long she can stand    How long can you walk comfortably?  trying to walk 5 min 3 times a day    Diagnostic tests  X-ray but results arent in the computer; Per patient bone spurs and arthritis.    Currently in Pain?  Yes    Pain Score  9     Pain Location  Knee    Pain Orientation  Left    Pain Descriptors / Indicators  Aching    Pain Type  Chronic pain    Pain Onset  More than a month ago    Pain Frequency  Intermittent    Aggravating Factors   standing and walking    Pain Relieving Factors  rest and ice    Effect of Pain on Daily Activities  difficulty walking    Multiple Pain Sites  Yes    Pain Score  6    Pain Location  Knee    Pain Orientation  Right    Pain Type  Chronic pain    Pain Onset  More than a month ago    Pain Frequency  Constant    Aggravating Factors   standing and walking    Pain Relieving Factors  rest    Effect of Pain on Daily Activities  difficulty perfroming ADL's                       [facility] Adult PT Treatment/Exercise - 04/14/19 0001      Knee/Hip Exercises: Stretches   Active Hamstring Stretch Limitations  seated hamstring stretch 3x20 sec hold    3 deep breaths each side.      Knee/Hip Exercises: Aerobic   Nustep  5 min L2 cuing for pace       Knee/Hip Exercises: Supine   Quad Sets  Both    Quad Sets Limitations  x10 left 2x10 right     Short Arc Quad Sets Limitations  x10 right 2x10 left     Bridges  10 reps    Other Supine Knee/Hip Exercises  supine clam shell green 2x10       Knee/Hip Exercises: Sidelying   Hip ABduction  10 reps;1 set    Hip ABduction Limitations  bil with  green t band      Ultrasound   Ultrasound Location  anterior left knee     Ultrasound Parameters  cont 1 mhz 1.5 w/cm2     Ultrasound Goals  Pain;Edema      Manual Therapy   Manual Therapy  Soft tissue mobilization;Passive ROM    Manual therapy comments  patella mobs    education of self patellar mobs bil   Soft tissue mobilization  to posterior knee     Passive ROM  into knee extension              PT Education - 04/14/19 1343    Education Details  reviewed technique with light ther-ex    Person(s) Educated  Patient    Methods  Explanation;Demonstration;Tactile cues;Verbal cues    Comprehension  Verbalized understanding;Returned demonstration;Verbal cues required;Tactile cues required       PT Short Term Goals - 04/14/19 1424      PT SHORT TERM GOAL #1   Title  Patient will be indepenent with basic HEP    Baseline  Pt given Intiial HEP    Time  3    Period  Weeks    Status  On-going      PT SHORT TERM GOAL #2   Title  Patient will demonstrate a 50% improvement in patella movement    Baseline  Pt shown self patellar mobilization today    Time  3    Period  Weeks    Status  On-going      PT SHORT TERM GOAL #3   Title  Patient will extend bilateral LE without increased pain    Baseline  Pt states pain is 4.5 today after patellar mobs    Time  3    Period  Weeks    Status  On-going        PT Long Term Goals - 03/20/19 0802      PT LONG TERM GOAL #1   Title  Patient will transfer sit to stand independently without use of hands and without increased pain    Time  6    Period  Weeks    Status  New    Target  Date  05/01/19      PT LONG TERM GOAL #2   Title  Patient will stand for 10 minutes without increased pain in order to perfrom ADL's    Time  6    Period  Weeks    Status  New    Target Date  05/01/19      PT LONG TERM GOAL #3   Title  Patient will ambualte 150' without increased pain with LRAD in order to improve ability to go to appointments     Time  6    Period  Weeks    Status  New    Target Date  05/01/19            Plan - 04/14/19 1408    Clinical Impression Statement  Patient was limited today by hih levels of pain. Therapy continues to focus on light strengthening and improving extension andpatella motion. Therapy will continue to adnance as tolerated but her mobility seems to be declining    Comorbidities  fibromaylgia, anxiety, obesity    Examination-Activity Limitations  Transfers;Locomotion Level;Sit;Sleep;Squat;Stairs;Stand    Examination-Participation Restrictions  Community Activity;Cleaning;Laundry    Clinical Decision Making  Moderate    Rehab Potential  Good    PT Frequency  2x / week    PT Duration  6 weeks    PT Treatment/Interventions  ADLs/Self Care Home Management;Cryotherapy;Electrical Stimulation;Moist Heat;Traction;Ultrasound;DME Instruction;Stair training;Functional mobility training;Therapeutic activities;Therapeutic exercise;Gait training;Neuromuscular re-education;Patient/family education;Passive range of motion;Manual techniques;Taping;Spinal Manipulations;Joint Manipulations    PT Next Visit Plan  Review exercises.  add hip strength/ knee strength as needed, try SLR, added deadlift and hip hinge to HEP; asses POC    PT Home Exercise Plan  quad set, seated clam with band, self patella mobs, supine heel slides, bridge, SAQ, LAQ sink squat, mini lunge forward, side and backward with UE support. hip hinge and deadlift on raised step    Consulted and Agree with Plan of Care  Patient       Patient will benefit from skilled therapeutic intervention in order to improve the following deficits and impairments:  Abnormal gait, Decreased range of motion, Difficulty walking, Obesity, Decreased endurance, Decreased mobility, Decreased strength, Improper body mechanics, Decreased activity tolerance, Pain  Visit Diagnosis: Chronic pain of left knee  Difficulty in walking, not elsewhere classified  Chronic  pain of right knee  Muscle weakness (generalized)     Problem List There are no active problems to display for this patient.   Carney Living PT DPT  04/14/2019, 2:48 PM  [facility] [address] [place], [place], [address] Phone: [phone]   Fax:  [phone]  Name: HAILYN TROMPETER MRN: [id number] Date of Birth: 03/05/1961

## 2019-04-15 DIAGNOSIS — J3089 Other allergic rhinitis: Secondary | ICD-10-CM | POA: Diagnosis not present

## 2019-04-15 DIAGNOSIS — J3081 Allergic rhinitis due to animal (cat) (dog) hair and dander: Secondary | ICD-10-CM | POA: Diagnosis not present

## 2019-04-15 DIAGNOSIS — J301 Allergic rhinitis due to pollen: Secondary | ICD-10-CM | POA: Diagnosis not present

## 2019-04-16 ENCOUNTER — Other Ambulatory Visit: Payer: Self-pay

## 2019-04-16 ENCOUNTER — Ambulatory Visit: Payer: PPO | Admitting: Physical Therapy

## 2019-04-16 ENCOUNTER — Encounter: Payer: Self-pay | Admitting: Physical Therapy

## 2019-04-16 DIAGNOSIS — R262 Difficulty in walking, not elsewhere classified: Secondary | ICD-10-CM

## 2019-04-16 DIAGNOSIS — I10 Essential (primary) hypertension: Secondary | ICD-10-CM | POA: Diagnosis not present

## 2019-04-16 DIAGNOSIS — G8929 Other chronic pain: Secondary | ICD-10-CM

## 2019-04-16 DIAGNOSIS — J4520 Mild intermittent asthma, uncomplicated: Secondary | ICD-10-CM | POA: Diagnosis not present

## 2019-04-16 DIAGNOSIS — M25562 Pain in left knee: Secondary | ICD-10-CM | POA: Diagnosis not present

## 2019-04-16 DIAGNOSIS — E119 Type 2 diabetes mellitus without complications: Secondary | ICD-10-CM | POA: Diagnosis not present

## 2019-04-16 DIAGNOSIS — E7800 Pure hypercholesterolemia, unspecified: Secondary | ICD-10-CM | POA: Diagnosis not present

## 2019-04-16 DIAGNOSIS — D649 Anemia, unspecified: Secondary | ICD-10-CM | POA: Diagnosis not present

## 2019-04-16 DIAGNOSIS — M25561 Pain in right knee: Secondary | ICD-10-CM

## 2019-04-16 NOTE — Therapy (Signed)
[place], [place], [address] Phone: [phone]   Fax:  [phone]  Physical Therapy Treatment  Patient Details  Name: Virginia Luna MRN: [id number] Date of Birth: 24-Feb-1961 Referring Provider (PT): Dr Eunice Blase    Encounter Date: 04/16/2019  PT End of Session - 04/16/19 1522    Visit Number  8    Number of Visits  12    Date for PT Re-Evaluation  05/01/19    Authorization Type  third party payer    PT Start Time  1334   Patient 4 minutes late   PT Stop Time  1414    PT Time Calculation (min)  40 min    Activity Tolerance  Patient tolerated treatment well    Behavior During Therapy  [facility] for tasks assessed/performed       Past Medical History:  Diagnosis Date  . Arthritis   . Asthma   . Chronic pain   . Diabetes mellitus without complication ([place])   . Fibromyalgia   . Sleep apnea    did use cpap-lost 25lb-says she does not need it  . Wears contact lenses     Past Surgical History:  Procedure Laterality Date  . BREAST BIOPSY    . BREAST EXCISIONAL BIOPSY    . BREAST LUMPECTOMY WITH RADIOACTIVE SEED LOCALIZATION Bilateral 11/24/2014   Procedure: BILATERAL BREAST LUMPECTOMY WITH RADIOACTIVE SEED LOCALIZATION;  Surgeon: Virginia Luna, MD;  Location: [place];  Service: General;  Laterality: Bilateral;  . CHOLECYSTECTOMY    . COLONOSCOPY    . DILATION AND CURETTAGE OF UTERUS    . ESOPHAGOGASTRODUODENOSCOPY (EGD) WITH PROPOFOL N/A 07/23/2016   Procedure: ESOPHAGOGASTRODUODENOSCOPY (EGD) WITH PROPOFOL;  Surgeon: Laurence Spates, MD;  [facility];  Service: Endoscopy;  Laterality: N/A;  . ESOPHAGOGASTRODUODENOSCOPY (EGD) WITH PROPOFOL N/A 06/11/2018   Procedure: ESOPHAGOGASTRODUODENOSCOPY (EGD) WITH PROPOFOL;  Surgeon: Laurence Spates, MD;  [facility];  Service: Endoscopy;  Laterality: N/A;  . LUMBAR LAMINECTOMY  2010  . UMBILICAL HERNIA REPAIR     age 58   . UPPER GI ENDOSCOPY      There were no vitals filed for this visit.  Subjective Assessment - 04/16/19 1341    Subjective  Patient reports her pain yesterday was a 9/10. Today it is down to a 6/10. She feels like her knee cap has been moving more. She was advised this has been one of the main goals fo therapy.    Limitations  Standing    How long can you sit comfortably?  Sitting for too long stiffens her knees    How long can you stand comfortably?  varies how long she can stand    How long can you walk comfortably?  trying to walk 5 min 3 times a day    Diagnostic tests  X-ray but results arent in the computer; Per patient bone spurs and arthritis.    Currently in Pain?  Yes    Pain Score  6     Pain Location  Knee    Pain Orientation  Left    Pain Descriptors / Indicators  Aching    Pain Type  Chronic pain    Pain Radiating Towards  standing and walking    Pain Onset  More than a month ago    Pain Frequency  Intermittent    Aggravating Factors   standing and walking    Pain Relieving Factors  rest and ice  Effect of Pain on Daily Activities  difficulty walking                       [facility] Adult PT Treatment/Exercise - 04/16/19 0001      Knee/Hip Exercises: Stretches   Active Hamstring Stretch Limitations  seated hamstring stretch 3x20 sec hold    3 deep breaths each side.    Other Knee/Hip Stretches  setaed clamshell 2x20 green       Knee/Hip Exercises: Aerobic   Nustep  5 min L2 cuing for pace       Knee/Hip Exercises: Standing   Knee Flexion Limitations  standing march x10 ach leg     Abduction Limitations  2x10     Extension Limitations  2x10      Knee/Hip Exercises: Supine   Quad Sets  Both    Bridges  10 reps      Knee/Hip Exercises: Sidelying   Hip ABduction  10 reps;1 set      Manual Therapy   Manual Therapy  Soft tissue mobilization;Passive ROM;Taping    Manual therapy comments  patella mobs    education of self patellar mobs bil    Soft tissue mobilization  to posterior knee     Passive ROM  into knee extension     McConnell  to left knee, educated patient on slef taping              PT Education - 04/16/19 1343    Education Details  reviewed HEP and symptom mangement    Person(s) Educated  Patient    Methods  Explanation;Demonstration;Tactile cues;Verbal cues    Comprehension  Verbalized understanding;Returned demonstration;Verbal cues required;Tactile cues required;Need further instruction       PT Short Term Goals - 04/14/19 1424      PT SHORT TERM GOAL #1   Title  Patient will be indepenent with basic HEP    Baseline  Pt given Intiial HEP    Time  3    Period  Weeks    Status  On-going      PT SHORT TERM GOAL #2   Title  Patient will demonstrate a 50% improvement in patella movement    Baseline  Pt shown self patellar mobilization today    Time  3    Period  Weeks    Status  On-going      PT SHORT TERM GOAL #3   Title  Patient will extend bilateral LE without increased pain    Baseline  Pt states pain is 4.5 today after patellar mobs    Time  3    Period  Weeks    Status  On-going        PT Long Term Goals - 03/20/19 0802      PT LONG TERM GOAL #1   Title  Patient will transfer sit to stand independently without use of hands and without increased pain    Time  6    Period  Weeks    Status  New    Target Date  05/01/19      PT LONG TERM GOAL #2   Title  Patient will stand for 10 minutes without increased pain in order to perfrom ADL's    Time  6    Period  Weeks    Status  New    Target Date  05/01/19      PT LONG TERM GOAL #3   Title  Patient will  ambualte 150' without increased pain with LRAD in order to improve ability to go to appointments    Time  6    Period  Weeks    Status  New    Target Date  05/01/19            Plan - 04/16/19 1526    Clinical Impression Statement  Patient did better this visit. She was able to tolerate exercises better. Therapy taped her  knee to help control her new patella range. She was encouraged to continue strengthening. We will assess patients plan next week.    Personal Factors and Comorbidities  Comorbidity 1;Comorbidity 2;Comorbidity 3+    Comorbidities  fibromaylgia, anxiety, obesity    Examination-Activity Limitations  Transfers;Locomotion Level;Sit;Sleep;Squat;Stairs;Stand    Clinical Decision Making  Moderate    Rehab Potential  Good    PT Frequency  2x / week    PT Duration  6 weeks    PT Treatment/Interventions  ADLs/Self Care Home Management;Cryotherapy;Electrical Stimulation;Moist Heat;Traction;Ultrasound;DME Instruction;Stair training;Functional mobility training;Therapeutic activities;Therapeutic exercise;Gait training;Neuromuscular re-education;Patient/family education;Passive range of motion;Manual techniques;Taping;Spinal Manipulations;Joint Manipulations    PT Next Visit Plan  Review exercises.  add hip strength/ knee strength as needed, try SLR, added deadlift and hip hinge to HEP; asses POC    PT Home Exercise Plan  quad set, seated clam with band, self patella mobs, supine heel slides, bridge, SAQ, LAQ sink squat, mini lunge forward, side and backward with UE support. hip hinge and deadlift on raised step    Consulted and Agree with Plan of Care  Patient       Patient will benefit from skilled therapeutic intervention in order to improve the following deficits and impairments:  Abnormal gait, Decreased range of motion, Difficulty walking, Obesity, Decreased endurance, Decreased mobility, Decreased strength, Improper body mechanics, Decreased activity tolerance, Pain  Visit Diagnosis: Chronic pain of left knee  Difficulty in walking, not elsewhere classified  Chronic pain of right knee     Problem List There are no active problems to display for this patient.   Carney Living PT DPT  04/16/2019, 3:33 PM  [facility] [address] [place], [place], [address] Phone: [phone]   Fax:  [phone]  Name: Virginia Luna MRN: [id number] Date of Birth: [birth year]

## 2019-04-21 ENCOUNTER — Encounter: Payer: Self-pay | Admitting: Physical Therapy

## 2019-04-21 ENCOUNTER — Other Ambulatory Visit: Payer: Self-pay

## 2019-04-21 ENCOUNTER — Ambulatory Visit: Payer: PPO | Attending: Family Medicine | Admitting: Physical Therapy

## 2019-04-21 DIAGNOSIS — M25561 Pain in right knee: Secondary | ICD-10-CM | POA: Insufficient documentation

## 2019-04-21 DIAGNOSIS — M25562 Pain in left knee: Secondary | ICD-10-CM | POA: Diagnosis not present

## 2019-04-21 DIAGNOSIS — M6281 Muscle weakness (generalized): Secondary | ICD-10-CM | POA: Diagnosis not present

## 2019-04-21 DIAGNOSIS — G8929 Other chronic pain: Secondary | ICD-10-CM | POA: Diagnosis not present

## 2019-04-21 DIAGNOSIS — R262 Difficulty in walking, not elsewhere classified: Secondary | ICD-10-CM | POA: Diagnosis not present

## 2019-04-21 NOTE — Therapy (Signed)
[place], [place], [address] Phone: [phone]   Fax:  [phone]  Physical Therapy Treatment  Patient Details  Name: Virginia Luna MRN: [id number] Date of Birth: May 15, 1961 Referring Provider (PT): Dr Eunice Blase    Encounter Date: 04/21/2019  PT End of Session - 04/21/19 1423    Visit Number  9    Number of Visits  12    Date for PT Re-Evaluation  05/01/19    Authorization Type  third party payer    PT Start Time  1338    PT Stop Time  1417    PT Time Calculation (min)  39 min    Activity Tolerance  Patient tolerated treatment well    Behavior During Therapy  [facility] for tasks assessed/performed       Past Medical History:  Diagnosis Date  . Arthritis   . Asthma   . Chronic pain   . Diabetes mellitus without complication ([place])   . Fibromyalgia   . Sleep apnea    did use cpap-lost 25lb-says she does not need it  . Wears contact lenses     Past Surgical History:  Procedure Laterality Date  . BREAST BIOPSY    . BREAST EXCISIONAL BIOPSY    . BREAST LUMPECTOMY WITH RADIOACTIVE SEED LOCALIZATION Bilateral 11/24/2014   Procedure: BILATERAL BREAST LUMPECTOMY WITH RADIOACTIVE SEED LOCALIZATION;  Surgeon: [name], MD;  Location: [place];  Service: General;  Laterality: Bilateral;  . CHOLECYSTECTOMY    . COLONOSCOPY    . DILATION AND CURETTAGE OF UTERUS    . ESOPHAGOGASTRODUODENOSCOPY (EGD) WITH PROPOFOL N/A 07/23/2016   Procedure: ESOPHAGOGASTRODUODENOSCOPY (EGD) WITH PROPOFOL;  Surgeon: Laurence Spates, MD;  [facility];  Service: Endoscopy;  Laterality: N/A;  . ESOPHAGOGASTRODUODENOSCOPY (EGD) WITH PROPOFOL N/A 06/11/2018   Procedure: ESOPHAGOGASTRODUODENOSCOPY (EGD) WITH PROPOFOL;  Surgeon: Laurence Spates, MD;  [facility];  Service: Endoscopy;  Laterality: N/A;  . LUMBAR LAMINECTOMY  2010  . UMBILICAL HERNIA REPAIR     age 58  . UPPER GI ENDOSCOPY       There were no vitals filed for this visit.  Subjective Assessment - 04/21/19 1343    Subjective  Patient reports her knee hjas been popping. She isnt having much pain but she is having consistent popping.    Limitations  Standing    How long can you sit comfortably?  Sitting for too long stiffens her knees    How long can you stand comfortably?  varies how long she can stand    How long can you walk comfortably?  trying to walk 5 min 3 times a day    Diagnostic tests  X-ray but results arent in the computer; Per patient bone spurs and arthritis.    Currently in Pain?  Yes    Pain Score  6     Pain Location  Knee    Pain Orientation  Left    Pain Descriptors / Indicators  Aching    Pain Type  Chronic pain    Pain Onset  More than a month ago    Pain Frequency  Intermittent    Aggravating Factors   Standing and walking    Pain Relieving Factors  rest and ice    Effect of Pain on Daily Activities  difficulty walking.    Multiple Pain Sites  No  [facility] Adult PT Treatment/Exercise - 04/21/19 0001      Knee/Hip Exercises: Aerobic   Nustep  5 min L2 cuing for pace       Ultrasound   Ultrasound Goals  Pain;Edema      Manual Therapy   Manual Therapy  Soft tissue mobilization;Passive ROM;Taping    Manual therapy comments  patella mobs    education of self patellar mobs bil   Soft tissue mobilization  to posterior knee     Passive ROM  into knee extension     McConnell  to left knee, educated patient on slef taping . Spent significant time reviewing dself taping. Patient may need further instruction. Also reviewed were to buy tape.              PT Education - 04/21/19 1346    Education Details  reviewed symptom mangement and pacing of daily activity    Person(s) Educated  Patient    Methods  Explanation;Demonstration;Tactile cues;Verbal cues    Comprehension  Verbalized understanding;Returned demonstration;Verbal cues required;Tactile  cues required       PT Short Term Goals - 04/14/19 1424      PT SHORT TERM GOAL #1   Title  Patient will be indepenent with basic HEP    Baseline  Pt given Intiial HEP    Time  3    Period  Weeks    Status  On-going      PT SHORT TERM GOAL #2   Title  Patient will demonstrate a 50% improvement in patella movement    Baseline  Pt shown self patellar mobilization today    Time  3    Period  Weeks    Status  On-going      PT SHORT TERM GOAL #3   Title  Patient will extend bilateral LE without increased pain    Baseline  Pt states pain is 4.5 today after patellar mobs    Time  3    Period  Weeks    Status  On-going        PT Long Term Goals - 03/20/19 0802      PT LONG TERM GOAL #1   Title  Patient will transfer sit to stand independently without use of hands and without increased pain    Time  6    Period  Weeks    Status  New    Target Date  05/01/19      PT LONG TERM GOAL #2   Title  Patient will stand for 10 minutes without increased pain in order to perfrom ADL's    Time  6    Period  Weeks    Status  New    Target Date  05/01/19      PT LONG TERM GOAL #3   Title  Patient will ambualte 150' without increased pain with LRAD in order to improve ability to go to appointments    Time  6    Period  Weeks    Status  New    Target Date  05/01/19            Plan - 04/21/19 1423    Clinical Impression Statement  Patient is making some progress. Patient was able to tape her self today given some time and intruction. She was 8 minutes late which limited her exercises. She was given a stright leg raise to perfrom at home. She was able to do 2 sets of 5 without increased pain.She  was advised to continue to work on her strengthening at home. She wqas also advised to try to come on time so we have enough time to do everything we need to.    Personal Factors and Comorbidities  Comorbidity 1;Comorbidity 2;Comorbidity 3+    Comorbidities  fibromaylgia, anxiety, obesity     Examination-Activity Limitations  Transfers;Locomotion Level;Sit;Sleep;Squat;Stairs;Stand    Examination-Participation Restrictions  Community Activity;Cleaning;Laundry    Clinical Decision Making  Moderate    Rehab Potential  Good    PT Frequency  2x / week    PT Duration  6 weeks    PT Treatment/Interventions  ADLs/Self Care Home Management;Cryotherapy;Electrical Stimulation;Moist Heat;Traction;Ultrasound;DME Instruction;Stair training;Functional mobility training;Therapeutic activities;Therapeutic exercise;Gait training;Neuromuscular re-education;Patient/family education;Passive range of motion;Manual techniques;Taping;Spinal Manipulations;Joint Manipulations    PT Next Visit Plan  Review exercises.  add hip strength/ knee strength as needed, try SLR, added deadlift and hip hinge to HEP; asses POC    PT Home Exercise Plan  quad set, seated clam with band, self patella mobs, supine heel slides, bridge, SAQ, LAQ sink squat, mini lunge forward, side and backward with UE support. hip hinge and deadlift on raised step    Consulted and Agree with Plan of Care  Patient       Patient will benefit from skilled therapeutic intervention in order to improve the following deficits and impairments:  Abnormal gait, Decreased range of motion, Difficulty walking, Obesity, Decreased endurance, Decreased mobility, Decreased strength, Improper body mechanics, Decreased activity tolerance, Pain  Visit Diagnosis: Chronic pain of left knee  Difficulty in walking, not elsewhere classified  Chronic pain of right knee  Muscle weakness (generalized)     Problem List There are no active problems to display for this patient.   Carney Living PT DPT  04/21/2019, 4:28 PM  [facility] [address] [place], [place], [address] Phone: [phone]   Fax:  [phone]  Name: Virginia Luna MRN: [id number] Date of Birth: August 08, 1961

## 2019-04-22 DIAGNOSIS — J301 Allergic rhinitis due to pollen: Secondary | ICD-10-CM | POA: Diagnosis not present

## 2019-04-22 DIAGNOSIS — J3089 Other allergic rhinitis: Secondary | ICD-10-CM | POA: Diagnosis not present

## 2019-04-22 DIAGNOSIS — J3081 Allergic rhinitis due to animal (cat) (dog) hair and dander: Secondary | ICD-10-CM | POA: Diagnosis not present

## 2019-04-23 ENCOUNTER — Ambulatory Visit: Payer: PPO | Admitting: Physical Therapy

## 2019-04-23 ENCOUNTER — Other Ambulatory Visit: Payer: Self-pay

## 2019-04-23 ENCOUNTER — Encounter: Payer: Self-pay | Admitting: Physical Therapy

## 2019-04-23 DIAGNOSIS — R262 Difficulty in walking, not elsewhere classified: Secondary | ICD-10-CM

## 2019-04-23 DIAGNOSIS — G8929 Other chronic pain: Secondary | ICD-10-CM

## 2019-04-23 DIAGNOSIS — M25562 Pain in left knee: Secondary | ICD-10-CM | POA: Diagnosis not present

## 2019-04-23 DIAGNOSIS — M6281 Muscle weakness (generalized): Secondary | ICD-10-CM

## 2019-04-23 NOTE — Therapy (Signed)
[place], [place], [address] Phone: [phone]   Fax:  [phone]  Physical Therapy Treatment  Patient Details  Name: Virginia Luna MRN: [id number] Date of Birth: 07-Jun-1961 Referring Provider (PT): Dr Eunice Blase    Encounter Date: 04/23/2019  PT End of Session - 04/23/19 1352    Visit Number  10    Number of Visits  12    Date for PT Re-Evaluation  05/01/19    Authorization Type  third party payer    PT Start Time  [id number]    PT Stop Time  1415    PT Time Calculation (min)  41 min    Activity Tolerance  Patient tolerated treatment well    Behavior During Therapy  [facility] for tasks assessed/performed       Past Medical History:  Diagnosis Date  . Arthritis   . Asthma   . Chronic pain   . Diabetes mellitus without complication ([place])   . Fibromyalgia   . Sleep apnea    did use cpap-lost 25lb-says she does not need it  . Wears contact lenses     Past Surgical History:  Procedure Laterality Date  . BREAST BIOPSY    . BREAST EXCISIONAL BIOPSY    . BREAST LUMPECTOMY WITH RADIOACTIVE SEED LOCALIZATION Bilateral 11/24/2014   Procedure: BILATERAL BREAST LUMPECTOMY WITH RADIOACTIVE SEED LOCALIZATION;  Surgeon: [name], MD;  Location: [place];  Service: General;  Laterality: Bilateral;  . CHOLECYSTECTOMY    . COLONOSCOPY    . DILATION AND CURETTAGE OF UTERUS    . ESOPHAGOGASTRODUODENOSCOPY (EGD) WITH PROPOFOL N/A 07/23/2016   Procedure: ESOPHAGOGASTRODUODENOSCOPY (EGD) WITH PROPOFOL;  Surgeon: Laurence Spates, MD;  Location: [facility];  Service: Endoscopy;  Laterality: N/A;  . ESOPHAGOGASTRODUODENOSCOPY (EGD) WITH PROPOFOL N/A 06/11/2018   Procedure: ESOPHAGOGASTRODUODENOSCOPY (EGD) WITH PROPOFOL;  Surgeon: Laurence Spates, MD;  Location: [facility];  Service: Endoscopy;  Laterality: N/A;  . LUMBAR LAMINECTOMY  2010  . UMBILICAL HERNIA REPAIR     age 58  . UPPER GI ENDOSCOPY       There were no vitals filed for this visit.  Subjective Assessment - 04/23/19 1342    Subjective  Patient reports that her knees have been OK. She feels like the pain has been on and off. She ordered her tape. Her pain has been up to a 6/10 today but it is                       [facility] Adult PT Treatment/Exercise - 04/23/19 0001      Knee/Hip Exercises: Stretches   Active Hamstring Stretch Limitations  seated hamstring stretch 3x20 sec hold    3 deep breaths each side.      Knee/Hip Exercises: Aerobic   Nustep  5 min L3 cuing for pace       Knee/Hip Exercises: Standing   Heel Raises Limitations  x20       Knee/Hip Exercises: Seated   Long Arc Quad Limitations  2x15 90-45 on theleft       Knee/Hip Exercises: Supine   Quad Sets Limitations  x20 bilateral     Bridges  20 reps    Straight Leg Raises Limitations  3x5 bilateral     Other Supine Knee/Hip Exercises  seated clamshell 2x10       Manual Therapy   Manual Therapy  Soft tissue mobilization;Passive ROM;Taping    Manual therapy comments  patella mobs    education of self patellar mobs bil   Soft tissue mobilization  to posterior knee     Passive ROM  into knee extension     McConnell  to left knee, educated patient on slef taping . Spent significant time reviewing dself taping. Patient may need further instruction. Also reviewed were to buy tape.                PT Short Term Goals - 04/14/19 1424      PT SHORT TERM GOAL #1   Title  Patient will be indepenent with basic HEP    Baseline  Pt given Intiial HEP    Time  3    Period  Weeks    Status  On-going      PT SHORT TERM GOAL #2   Title  Patient will demonstrate a 50% improvement in patella movement    Baseline  Pt shown self patellar mobilization today    Time  3    Period  Weeks    Status  On-going      PT SHORT TERM GOAL #3   Title  Patient will extend bilateral LE without increased pain    Baseline  Pt states pain is 4.5 today  after patellar mobs    Time  3    Period  Weeks    Status  On-going        PT Long Term Goals - 03/20/19 0802      PT LONG TERM GOAL #1   Title  Patient will transfer sit to stand independently without use of hands and without increased pain    Time  6    Period  Weeks    Status  New    Target Date  05/01/19      PT LONG TERM GOAL #2   Title  Patient will stand for 10 minutes without increased pain in order to perfrom ADL's    Time  6    Period  Weeks    Status  New    Target Date  05/01/19      PT LONG TERM GOAL #3   Title  Patient will ambualte 150' without increased pain with LRAD in order to improve ability to go to appointments    Time  6    Period  Weeks    Status  New    Target Date  05/01/19            Plan - 04/23/19 1445    Clinical Impression Statement  Patient had less posterior swelling in her knee today. She was able to independently tape herself. Therapy reviewed her HEP and encouraged her to continue. her pain is becoming more independent. She was remided that she can use ice at home to manage her pain and swelling as she strengthenis. Therapy will continue to progress as tolerated.    Examination-Activity Limitations  Transfers;Locomotion Level;Sit;Sleep;Squat;Stairs;Stand    Examination-Participation Restrictions  Community Activity;Cleaning;Laundry    Rehab Potential  Good    PT Frequency  2x / week    PT Duration  6 weeks    PT Treatment/Interventions  ADLs/Self Care Home Management;Cryotherapy;Electrical Stimulation;Moist Heat;Traction;Ultrasound;DME Instruction;Stair training;Functional mobility training;Therapeutic activities;Therapeutic exercise;Gait training;Neuromuscular re-education;Patient/family education;Passive range of motion;Manual techniques;Taping;Spinal Manipulations;Joint Manipulations    PT Next Visit Plan  Review exercises.  add hip strength/ knee strength as needed, try SLR, added deadlift and hip hinge to HEP; asses POC continues  to progress as tolerated  PT Home Exercise Plan  quad set, seated clam with band, self patella mobs, supine heel slides, bridge, SAQ, LAQ sink squat, mini lunge forward, side and backward with UE support. hip hinge and deadlift on raised step    Consulted and Agree with Plan of Care  Patient       Patient will benefit from skilled therapeutic intervention in order to improve the following deficits and impairments:  Abnormal gait, Decreased range of motion, Difficulty walking, Obesity, Decreased endurance, Decreased mobility, Decreased strength, Improper body mechanics, Decreased activity tolerance, Pain  Visit Diagnosis: Chronic pain of left knee  Difficulty in walking, not elsewhere classified  Chronic pain of right knee  Muscle weakness (generalized)     Problem List There are no active problems to display for this patient.   Carney Living PT DPT  04/23/2019, 2:54 PM  [facility] [address] [place], [place], [address] Phone: [phone]   Fax:  [phone]  Name: EMILYAH CRAWMER MRN: [id number] Date of Birth: 1960/11/28

## 2019-04-29 DIAGNOSIS — J3081 Allergic rhinitis due to animal (cat) (dog) hair and dander: Secondary | ICD-10-CM | POA: Diagnosis not present

## 2019-04-29 DIAGNOSIS — J3089 Other allergic rhinitis: Secondary | ICD-10-CM | POA: Diagnosis not present

## 2019-04-29 DIAGNOSIS — J301 Allergic rhinitis due to pollen: Secondary | ICD-10-CM | POA: Diagnosis not present

## 2019-04-30 ENCOUNTER — Ambulatory Visit: Payer: PPO | Admitting: Physical Therapy

## 2019-04-30 ENCOUNTER — Other Ambulatory Visit: Payer: Self-pay

## 2019-04-30 ENCOUNTER — Encounter: Payer: Self-pay | Admitting: Physical Therapy

## 2019-04-30 DIAGNOSIS — M25562 Pain in left knee: Secondary | ICD-10-CM

## 2019-04-30 DIAGNOSIS — G8929 Other chronic pain: Secondary | ICD-10-CM

## 2019-04-30 DIAGNOSIS — M6281 Muscle weakness (generalized): Secondary | ICD-10-CM

## 2019-04-30 DIAGNOSIS — R262 Difficulty in walking, not elsewhere classified: Secondary | ICD-10-CM

## 2019-04-30 DIAGNOSIS — M25561 Pain in right knee: Secondary | ICD-10-CM

## 2019-04-30 NOTE — Therapy (Signed)
[place], [place], [address] Phone: [phone]   Fax:  [phone]  Physical Therapy Treatment  Patient Details  Name: Virginia Luna MRN: [id number] Date of Birth: September 21, 1960 Referring Provider (PT): Dr Eunice Blase    Encounter Date: 04/30/2019  PT End of Session - 04/30/19 1347    Visit Number  11    Number of Visits  12    Date for PT Re-Evaluation  05/01/19    Authorization Type  third party payer    PT Start Time  1336   Patient 6 minutes late   PT Stop Time  1415    PT Time Calculation (min)  39 min    Activity Tolerance  Patient tolerated treatment well    Behavior During Therapy  [facility] for tasks assessed/performed       Past Medical History:  Diagnosis Date  . Arthritis   . Asthma   . Chronic pain   . Diabetes mellitus without complication ([place])   . Fibromyalgia   . Sleep apnea    did use cpap-lost 25lb-says she does not need it  . Wears contact lenses     Past Surgical History:  Procedure Laterality Date  . BREAST BIOPSY    . BREAST EXCISIONAL BIOPSY    . BREAST LUMPECTOMY WITH RADIOACTIVE SEED LOCALIZATION Bilateral 11/24/2014   Procedure: BILATERAL BREAST LUMPECTOMY WITH RADIOACTIVE SEED LOCALIZATION;  Surgeon: [name], MD;  Location: [place];  Service: General;  Laterality: Bilateral;  . CHOLECYSTECTOMY    . COLONOSCOPY    . DILATION AND CURETTAGE OF UTERUS    . ESOPHAGOGASTRODUODENOSCOPY (EGD) WITH PROPOFOL N/A 07/23/2016   Procedure: ESOPHAGOGASTRODUODENOSCOPY (EGD) WITH PROPOFOL;  Surgeon: Laurence Spates, MD;  Location: [facility];  Service: Endoscopy;  Laterality: N/A;  . ESOPHAGOGASTRODUODENOSCOPY (EGD) WITH PROPOFOL N/A 06/11/2018   Procedure: ESOPHAGOGASTRODUODENOSCOPY (EGD) WITH PROPOFOL;  Surgeon: Laurence Spates, MD;  [facility];  Service: Endoscopy;  Laterality: N/A;  . LUMBAR LAMINECTOMY  2010  . UMBILICAL HERNIA REPAIR     age  20  . UPPER GI ENDOSCOPY      There were no vitals filed for this visit.  Subjective Assessment - 04/30/19 1343    Subjective  Patient reports her knees are doing well today but 2 days ago she was nearly in tears. She reports she can not remmeber doing anything. She thinks the weather could have played a role. She reports there were two times over the week where she was brought to tears.    Limitations  Standing    How long can you sit comfortably?  Sitting for too long stiffens her knees    How long can you stand comfortably?  varies how long she can stand    How long can you walk comfortably?  trying to walk 5 min 3 times a day    Diagnostic tests  X-ray but results arent in the computer; Per patient bone spurs and arthritis.    Currently in Pain?  Yes    Pain Score  5     Pain Location  Knee    Pain Orientation  Left    Pain Descriptors / Indicators  Aching    Pain Type  Chronic pain    Pain Radiating Towards  standing and walking    Pain Onset  More than a month ago    Pain Frequency  Intermittent    Aggravating Factors   standing and walking  Pain Relieving Factors  rest and ice    Effect of Pain on Daily Activities  difficulty walking                       [facility] Adult PT Treatment/Exercise - 04/30/19 0001      Self-Care   Other Self-Care Comments   reviewed strategies such as icing and stretching when her pain levels are high       Knee/Hip Exercises: Stretches   Active Hamstring Stretch Limitations  seated hamstring stretch 3x20 sec hold    3 deep breaths each side.      Knee/Hip Exercises: Aerobic   Nustep  5 min L3 cuing for pace       Knee/Hip Exercises: Standing   Heel Raises Limitations  x20     Hip Flexion Limitations  standing hip flexion with target to make sure she is going straight x10 bilateral       Knee/Hip Exercises: Seated   Long Arc Quad Limitations  2x15 90-45 on theleft     Other Seated Knee/Hip Exercises  seated clamshell 2x15  green       Knee/Hip Exercises: Supine   Quad Sets Limitations  x20 5 sec hold     Short Arc Quad Sets Limitations  2x10 bilateral     Bridges  20 reps               PT Short Term Goals - 04/30/19 1622      PT SHORT TERM GOAL #1   Title  Patient will be indepenent with basic HEP    Baseline  Pt given Intiial HEP    Time  3    Period  Weeks    Status  On-going      PT SHORT TERM GOAL #2   Title  Patient will demonstrate a 50% improvement in patella movement    Baseline  Pt shown self patellar mobilization today    Time  3    Period  Weeks    Status  On-going      PT SHORT TERM GOAL #3   Title  Patient will extend bilateral LE without increased pain    Baseline  Pt states pain is 4.5 today after patellar mobs    Time  3    Period  Weeks    Status  On-going        PT Long Term Goals - 03/20/19 0802      PT LONG TERM GOAL #1   Title  Patient will transfer sit to stand independently without use of hands and without increased pain    Time  6    Period  Weeks    Status  New    Target Date  05/01/19      PT LONG TERM GOAL #2   Title  Patient will stand for 10 minutes without increased pain in order to perfrom ADL's    Time  6    Period  Weeks    Status  New    Target Date  05/01/19      PT LONG TERM GOAL #3   Title  Patient will ambualte 150' without increased pain with LRAD in order to improve ability to go to appointments    Time  6    Period  Weeks    Status  New    Target Date  05/01/19            Plan - 04/30/19  1359    Clinical Impression Statement  Patient was able to complete exercises today, She had minor pain in both knees. She remains motivated and trys very hard but her knees are causing her significant pain. Therapy reviewed strategies to keep her pain down.    Personal Factors and Comorbidities  Comorbidity 1;Comorbidity 2;Comorbidity 3+    Comorbidities  fibromaylgia, anxiety, obesity    Examination-Activity Limitations   Transfers;Locomotion Level;Sit;Sleep;Squat;Stairs;Stand    Examination-Participation Restrictions  Community Activity;Cleaning;Laundry    Clinical Decision Making  Moderate    Rehab Potential  Good    PT Frequency  2x / week    PT Duration  6 weeks    PT Treatment/Interventions  ADLs/Self Care Home Management;Cryotherapy;Electrical Stimulation;Moist Heat;Traction;Ultrasound;DME Instruction;Stair training;Functional mobility training;Therapeutic activities;Therapeutic exercise;Gait training;Neuromuscular re-education;Patient/family education;Passive range of motion;Manual techniques;Taping;Spinal Manipulations;Joint Manipulations    PT Next Visit Plan  Review exercises.  add hip strength/ knee strength as needed, try SLR, added deadlift and hip hinge to HEP; asses POC continues to progress as tolerated    PT Home Exercise Plan  quad set, seated clam with band, self patella mobs, supine heel slides, bridge, SAQ, LAQ sink squat, mini lunge forward, side and backward with UE support. hip hinge and deadlift on raised step    Consulted and Agree with Plan of Care  Patient       Patient will benefit from skilled therapeutic intervention in order to improve the following deficits and impairments:  Abnormal gait, Decreased range of motion, Difficulty walking, Obesity, Decreased endurance, Decreased mobility, Decreased strength, Improper body mechanics, Decreased activity tolerance, Pain  Visit Diagnosis: Chronic pain of left knee  Difficulty in walking, not elsewhere classified  Chronic pain of right knee  Muscle weakness (generalized)     Problem List There are no active problems to display for this patient.   Carney Living PT DPT  04/30/2019, 4:25 PM  [facility] [address] [place], [place], [address] Phone: [phone]   Fax:  [phone]  Name: Virginia Luna MRN: [id number] Date of Birth: July 20, 1961

## 2019-05-05 ENCOUNTER — Encounter: Payer: Self-pay | Admitting: Physical Therapy

## 2019-05-05 ENCOUNTER — Ambulatory Visit: Payer: PPO | Admitting: Physical Therapy

## 2019-05-05 ENCOUNTER — Other Ambulatory Visit: Payer: Self-pay

## 2019-05-05 DIAGNOSIS — M25562 Pain in left knee: Secondary | ICD-10-CM | POA: Diagnosis not present

## 2019-05-05 DIAGNOSIS — G8929 Other chronic pain: Secondary | ICD-10-CM

## 2019-05-05 DIAGNOSIS — M6281 Muscle weakness (generalized): Secondary | ICD-10-CM

## 2019-05-05 DIAGNOSIS — R262 Difficulty in walking, not elsewhere classified: Secondary | ICD-10-CM

## 2019-05-06 DIAGNOSIS — D649 Anemia, unspecified: Secondary | ICD-10-CM | POA: Diagnosis not present

## 2019-05-06 DIAGNOSIS — J4520 Mild intermittent asthma, uncomplicated: Secondary | ICD-10-CM | POA: Diagnosis not present

## 2019-05-06 DIAGNOSIS — J3089 Other allergic rhinitis: Secondary | ICD-10-CM | POA: Diagnosis not present

## 2019-05-06 DIAGNOSIS — E119 Type 2 diabetes mellitus without complications: Secondary | ICD-10-CM | POA: Diagnosis not present

## 2019-05-06 DIAGNOSIS — J301 Allergic rhinitis due to pollen: Secondary | ICD-10-CM | POA: Diagnosis not present

## 2019-05-06 DIAGNOSIS — E7800 Pure hypercholesterolemia, unspecified: Secondary | ICD-10-CM | POA: Diagnosis not present

## 2019-05-06 DIAGNOSIS — J3081 Allergic rhinitis due to animal (cat) (dog) hair and dander: Secondary | ICD-10-CM | POA: Diagnosis not present

## 2019-05-06 DIAGNOSIS — I10 Essential (primary) hypertension: Secondary | ICD-10-CM | POA: Diagnosis not present

## 2019-05-06 NOTE — Therapy (Signed)
[place], [place], [address] Phone: [phone]   Fax:  [phone]  Physical Therapy Treatment/Re-eval   Patient Details  Name: Virginia Luna MRN: [id number] Date of Birth: 1960/09/06 Referring Provider (PT): Dr Eunice Blase    Encounter Date: 05/05/2019  PT End of Session - 05/05/19 1352    Visit Number  12    Number of Visits  20    Date for PT Re-Evaluation  06/02/19    Authorization Type  third party payer    PT Start Time  1334    PT Stop Time  1414    PT Time Calculation (min)  40 min    Activity Tolerance  Patient tolerated treatment well    Behavior During Therapy  [facility] for tasks assessed/performed       Past Medical History:  Diagnosis Date  . Arthritis   . Asthma   . Chronic pain   . Diabetes mellitus without complication ([place])   . Fibromyalgia   . Sleep apnea    did use cpap-lost 25lb-says she does not need it  . Wears contact lenses     Past Surgical History:  Procedure Laterality Date  . BREAST BIOPSY    . BREAST EXCISIONAL BIOPSY    . BREAST LUMPECTOMY WITH RADIOACTIVE SEED LOCALIZATION Bilateral 11/24/2014   Procedure: BILATERAL BREAST LUMPECTOMY WITH RADIOACTIVE SEED LOCALIZATION;  Surgeon: [name], MD;  Location: [place];  Service: General;  Laterality: Bilateral;  . CHOLECYSTECTOMY    . COLONOSCOPY    . DILATION AND CURETTAGE OF UTERUS    . ESOPHAGOGASTRODUODENOSCOPY (EGD) WITH PROPOFOL N/A 07/23/2016   Procedure: ESOPHAGOGASTRODUODENOSCOPY (EGD) WITH PROPOFOL;  Surgeon: Laurence Spates, MD;  Location: [facility];  Service: Endoscopy;  Laterality: N/A;  . ESOPHAGOGASTRODUODENOSCOPY (EGD) WITH PROPOFOL N/A 06/11/2018   Procedure: ESOPHAGOGASTRODUODENOSCOPY (EGD) WITH PROPOFOL;  Surgeon: Laurence Spates, MD;  [facility]: [facility];  Service: Endoscopy;  Laterality: N/A;  . LUMBAR LAMINECTOMY  2010  . UMBILICAL HERNIA REPAIR     age 58  . UPPER GI  ENDOSCOPY      There were no vitals filed for this visit.  Subjective Assessment - 05/05/19 1338    Subjective  Patient reports a 5/10 pain. She reports it was not too bad over the weekend. She has been working on her exercises over the weekend.    Limitations  Standing    How long can you sit comfortably?  Sitting for too long stiffens her knees    How long can you stand comfortably?  varies how long she can stand    How long can you walk comfortably?  trying to walk 5 min 3 times a day    Diagnostic tests  X-ray but results arent in the computer; Per patient bone spurs and arthritis.    Currently in Pain?  Yes    Pain Score  5     Pain Location  Knee    Pain Orientation  Left    Pain Descriptors / Indicators  Aching    Pain Type  Chronic pain    Pain Radiating Towards  standing and walking    Pain Onset  More than a month ago    Pain Frequency  Intermittent    Aggravating Factors   standing and walking    Pain Relieving Factors  rest and ice    Effect of Pain on Daily Activities  difficulty walking  [facility] PT Assessment - 05/06/19 0001      Strength   Left Hip Flexion  4-/5    Left Hip ABduction  4/5    Left Hip ADduction  4+/5    Right Knee Flexion  4/5    Right Knee Extension  4/5    Left Knee Flexion  4/5    Left Knee Extension  4/5      Palpation   Patella mobility  significant improvement on the left; improving on the right      Ambulation/Gait   Gait Comments  continues to have significant lateral movement but reports she has been moving further.                    [place] Adult PT Treatment/Exercise - 05/06/19 0001      Knee/Hip Exercises: Stretches   Active Hamstring Stretch Limitations  seated hamstring stretch 3x20 sec hold    3 deep breaths each side.      Knee/Hip Exercises: Aerobic   Nustep  5 min L3 cuing for pace       Knee/Hip Exercises: Standing   Heel Raises Limitations  x20     Forward Step Up  2 sets;5 sets;Step Height:  4";Hand Hold: 2    Other Standing Knee Exercises  sit to stand from elevated table 3x3       Knee/Hip Exercises: Seated   Long Arc Quad Limitations  2x15 90-45 on theleft     Other Seated Knee/Hip Exercises  seated clamshell 2x15 green       Knee/Hip Exercises: Supine   Quad Sets Limitations  x20 bilateral 5 sec hold     Short Arc Quad Sets Limitations  x15 bilateral     Straight Leg Raises Limitations  x10 right 2x5 left; increased pain with left       Manual Therapy   Manual therapy comments  patella mobs     Soft tissue mobilization  to posterior knee     Passive ROM  into knee extension              PT Education - 05/05/19 1340    Education Details  reviewed HEp and symptom management    Person(s) Educated  Patient    Methods  Explanation;Demonstration;Tactile cues;Verbal cues    Comprehension  Verbalized understanding;Returned demonstration;Verbal cues required;Tactile cues required       PT Short Term Goals - 05/06/19 0819      PT SHORT TERM GOAL #1   Title  Patient will be indepenent with basic HEP    Baseline  working on basic HEP    Time  3    Period  Weeks    Status  Achieved      PT SHORT TERM GOAL #2   Title  Patient will demonstrate a 50% improvement in patella movement    Baseline  signifjcant improvement bilateral    Time  3    Period  Weeks    Status  Achieved    Target Date  04/10/19      PT SHORT TERM GOAL #3   Title  Patient will extend bilateral LE without increased pain    Baseline  continues to have pain with full left extension but improved    Time  3    Period  Weeks    Status  Achieved        PT Long Term Goals - 03/20/19 0802      PT LONG TERM  GOAL #1   Title  Patient will transfer sit to stand independently without use of hands and without increased pain    Time  6    Period  Weeks    Status  New    Target Date  05/01/19      PT LONG TERM GOAL #2   Title  Patient will stand for 10 minutes without increased pain in order  to perfrom ADL's    Time  6    Period  Weeks    Status  New    Target Date  05/01/19      PT LONG TERM GOAL #3   Title  Patient will ambualte 150' without increased pain with LRAD in order to improve ability to go to appointments    Time  6    Period  Weeks    Status  New    Target Date  05/01/19            Plan - 05/05/19 1403    Clinical Impression Statement  Patient is making prgress. Her pain is less frequent and less extreme. She is doing more activity. She feels like it is easier to get in and out of the bathtub. Her knee caps arenow moving which is causing some clicking but she was assured this is a good thing. She remains motivated. She is making expected slow but steady progress. Therapy focused on functional training today revieweding sit to stand and steps. She would benefit from continued therapy 2W4.    Personal Factors and Comorbidities  Comorbidity 1;Comorbidity 2;Comorbidity 3+    Comorbidities  fibromaylgia, anxiety, obesity    Stability/Clinical Decision Making  Stable/Uncomplicated    Clinical Decision Making  Moderate    Rehab Potential  Good    PT Frequency  2x / week    PT Duration  4 weeks    PT Treatment/Interventions  ADLs/Self Care Home Management;Cryotherapy;Electrical Stimulation;Moist Heat;Traction;Ultrasound;DME Instruction;Stair training;Functional mobility training;Therapeutic activities;Therapeutic exercise;Gait training;Neuromuscular re-education;Patient/family education;Passive range of motion;Manual techniques;Taping;Spinal Manipulations;Joint Manipulations    PT Next Visit Plan  Review exercises.  add hip strength/ knee strength as needed, try SLR, added deadlift and hip hinge to HEP; asses POC continues to progress as tolerated. Continue to advance exercises as tolerated.    PT Home Exercise Plan  quad set, seated clam with band, self patella mobs, supine heel slides, bridge, SAQ, LAQ sink squat, mini lunge forward, side and backward with UE  support. hip hinge and deadlift on raised step       Patient will benefit from skilled therapeutic intervention in order to improve the following deficits and impairments:  Abnormal gait, Decreased range of motion, Difficulty walking, Obesity, Decreased endurance, Decreased mobility, Decreased strength, Improper body mechanics, Decreased activity tolerance, Pain  Visit Diagnosis: Chronic pain of left knee  Difficulty in walking, not elsewhere classified  Chronic pain of right knee  Muscle weakness (generalized)     Problem List There are no active problems to display for this patient.   Carney Living  PT DPT  05/06/2019, 8:51 AM  [facility] [address] [place], [place], [address] Phone: [phone]   Fax:  [phone]  Name: ARELYN SWOFFORD MRN: [id number] Date of Birth: [birth year]

## 2019-05-07 ENCOUNTER — Other Ambulatory Visit: Payer: Self-pay

## 2019-05-07 ENCOUNTER — Ambulatory Visit: Payer: PPO | Admitting: Physical Therapy

## 2019-05-07 DIAGNOSIS — M6281 Muscle weakness (generalized): Secondary | ICD-10-CM

## 2019-05-07 DIAGNOSIS — G8929 Other chronic pain: Secondary | ICD-10-CM

## 2019-05-07 DIAGNOSIS — M25562 Pain in left knee: Secondary | ICD-10-CM | POA: Diagnosis not present

## 2019-05-07 DIAGNOSIS — R262 Difficulty in walking, not elsewhere classified: Secondary | ICD-10-CM

## 2019-05-07 NOTE — Therapy (Signed)
[place], [place], [address] Phone: [phone]   Fax:  [phone]  Physical Therapy Treatment  Patient Details  Name: Virginia Luna MRN: [id number] Date of Birth: April 20, 1961 Referring Provider (PT): Dr Eunice Blase    Encounter Date: 05/07/2019  PT End of Session - 05/07/19 1341    Visit Number  13    Number of Visits  20    Date for PT Re-Evaluation  06/03/19    Authorization Type  third party payer    PT Start Time  1335    PT Stop Time  1415    PT Time Calculation (min)  40 min    Activity Tolerance  Patient tolerated treatment well    Behavior During Therapy  [facility] for tasks assessed/performed       Past Medical History:  Diagnosis Date  . Arthritis   . Asthma   . Chronic pain   . Diabetes mellitus without complication ([place])   . Fibromyalgia   . Sleep apnea    did use cpap-lost 25lb-says she does not need it  . Wears contact lenses     Past Surgical History:  Procedure Laterality Date  . BREAST BIOPSY    . BREAST EXCISIONAL BIOPSY    . BREAST LUMPECTOMY WITH RADIOACTIVE SEED LOCALIZATION Bilateral 11/24/2014   Procedure: BILATERAL BREAST LUMPECTOMY WITH RADIOACTIVE SEED LOCALIZATION;  Surgeon: [name], MD;  Location: [place];  Service: General;  Laterality: Bilateral;  . CHOLECYSTECTOMY    . COLONOSCOPY    . DILATION AND CURETTAGE OF UTERUS    . ESOPHAGOGASTRODUODENOSCOPY (EGD) WITH PROPOFOL N/A 07/23/2016   Procedure: ESOPHAGOGASTRODUODENOSCOPY (EGD) WITH PROPOFOL;  Surgeon: Laurence Spates, MD;  [facility];  Service: Endoscopy;  Laterality: N/A;  . ESOPHAGOGASTRODUODENOSCOPY (EGD) WITH PROPOFOL N/A 06/11/2018   Procedure: ESOPHAGOGASTRODUODENOSCOPY (EGD) WITH PROPOFOL;  Surgeon: Laurence Spates, MD;  [facility];  Service: Endoscopy;  Laterality: N/A;  . LUMBAR LAMINECTOMY  2010  . UMBILICAL HERNIA REPAIR     age 89  . UPPER GI ENDOSCOPY       There were no vitals filed for this visit.  Subjective Assessment - 05/07/19 1339    Subjective  Patient reports her pain is not too bad today. She was not too sore after the last visit. She has been working on her stretches and exercises at home.    Limitations  Standing    How long can you sit comfortably?  Sitting for too long stiffens her knees    How long can you stand comfortably?  varies how long she can stand    How long can you walk comfortably?  trying to walk 5 min 3 times a day    Diagnostic tests  X-ray but results arent in the computer; Per patient bone spurs and arthritis.    Currently in Pain?  Yes    Pain Score  5     Pain Location  Knee    Pain Orientation  Left;Right    Pain Descriptors / Indicators  Aching    Pain Type  Chronic pain    Pain Onset  More than a month ago    Pain Frequency  Intermittent    Aggravating Factors   standing and walking    Pain Relieving Factors  rest and ice    Effect of Pain on Daily Activities  [facility] Adult PT Treatment/Exercise - 05/07/19 0001      Knee/Hip Exercises: Stretches   Active Hamstring Stretch Limitations  seated hamstring stretch 3x20 sec hold    3 deep breaths each side.      Knee/Hip Exercises: Supine   Quad Sets Limitations  x20 Bilteral 5 sec hold     Short Arc Quad Sets Limitations  x15 bilateral     Straight Leg Raises Limitations  x10 right 2x5 left; increased pain with left       Ultrasound   Ultrasound Location  Posterior left knee     Ultrasound Parameters  1.5 w/cm2 cont     Ultrasound Goals  Pain;Edema      Manual Therapy   Manual therapy comments  patella mobs     Soft tissue mobilization  to posterior knee     Passive ROM  into knee extension              PT Education - 05/07/19 1341    Education Details  HEP and symptom mangement    Person(s) Educated  Patient    Methods  Explanation;Demonstration;Tactile cues;Verbal cues    Comprehension   Verbalized understanding;Verbal cues required;Tactile cues required;Returned demonstration       PT Short Term Goals - 05/06/19 0819      PT SHORT TERM GOAL #1   Title  Patient will be indepenent with basic HEP    Baseline  working on basic HEP    Time  3    Period  Weeks    Status  Achieved      PT SHORT TERM GOAL #2   Title  Patient will demonstrate a 50% improvement in patella movement    Baseline  signifjcant improvement bilateral    Time  3    Period  Weeks    Status  Achieved    Target Date  04/10/19      PT SHORT TERM GOAL #3   Title  Patient will extend bilateral LE without increased pain    Baseline  continues to have pain with full left extension but improved    Time  3    Period  Weeks    Status  Achieved        PT Long Term Goals - 03/20/19 0802      PT LONG TERM GOAL #1   Title  Patient will transfer sit to stand independently without use of hands and without increased pain    Time  6    Period  Weeks    Status  New    Target Date  05/01/19      PT LONG TERM GOAL #2   Title  Patient will stand for 10 minutes without increased pain in order to perfrom ADL's    Time  6    Period  Weeks    Status  New    Target Date  05/01/19      PT LONG TERM GOAL #3   Title  Patient will ambualte 150' without increased pain with LRAD in order to improve ability to go to appointments    Time  6    Period  Weeks    Status  New    Target Date  05/01/19            Plan - 05/07/19 1357    Clinical Impression Statement  Patient continues to make proress. Therapy continues to focus on getting patient to full pain-free extension. She can get  to full extension but starts having significant pain at end ranges. Her patellas remain mobilie. She has been working on that at home. Therapy continues to advance functional strengtheing exercises. Therapy added ultrasound    Personal Factors and Comorbidities  Comorbidity 1;Comorbidity 2;Comorbidity 3+    Comorbidities   fibromaylgia, anxiety, obesity    Examination-Activity Limitations  Transfers;Locomotion Level;Sit;Sleep;Squat;Stairs;Stand    Stability/Clinical Decision Making  Stable/Uncomplicated    Clinical Decision Making  Moderate    Rehab Potential  Good    PT Frequency  2x / week    PT Duration  4 weeks    PT Treatment/Interventions  ADLs/Self Care Home Management;Cryotherapy;Electrical Stimulation;Moist Heat;Traction;Ultrasound;DME Instruction;Stair training;Functional mobility training;Therapeutic activities;Therapeutic exercise;Gait training;Neuromuscular re-education;Patient/family education;Passive range of motion;Manual techniques;Taping;Spinal Manipulations;Joint Manipulations    PT Next Visit Plan  continue to advance functional strengthening as tolerated.    PT Home Exercise Plan  quad set, seated clam with band, self patella mobs, supine heel slides, bridge, SAQ, LAQ sink squat, mini lunge forward, side and backward with UE support. hip hinge and deadlift on raised step    Consulted and Agree with Plan of Care  Patient       Patient will benefit from skilled therapeutic intervention in order to improve the following deficits and impairments:  Abnormal gait, Decreased range of motion, Difficulty walking, Obesity, Decreased endurance, Decreased mobility, Decreased strength, Improper body mechanics, Decreased activity tolerance, Pain  Visit Diagnosis: Chronic pain of left knee  Difficulty in walking, not elsewhere classified  Chronic pain of right knee  Muscle weakness (generalized)     Problem List There are no active problems to display for this patient.   Carney Living PT DPT  05/07/2019, 5:16 PM  [facility] [address] [place], [place], [address] Phone: [phone]   Fax:  [phone]  Name: Virginia Luna MRN: [id number] Date of Birth: Mar 03, 1961

## 2019-05-12 ENCOUNTER — Ambulatory Visit: Payer: PPO | Admitting: Physical Therapy

## 2019-05-12 ENCOUNTER — Encounter: Payer: Self-pay | Admitting: Physical Therapy

## 2019-05-12 ENCOUNTER — Other Ambulatory Visit: Payer: Self-pay

## 2019-05-12 DIAGNOSIS — M25562 Pain in left knee: Secondary | ICD-10-CM

## 2019-05-12 DIAGNOSIS — G8929 Other chronic pain: Secondary | ICD-10-CM

## 2019-05-12 DIAGNOSIS — R262 Difficulty in walking, not elsewhere classified: Secondary | ICD-10-CM

## 2019-05-12 DIAGNOSIS — M25561 Pain in right knee: Secondary | ICD-10-CM

## 2019-05-12 DIAGNOSIS — M6281 Muscle weakness (generalized): Secondary | ICD-10-CM

## 2019-05-13 DIAGNOSIS — J301 Allergic rhinitis due to pollen: Secondary | ICD-10-CM | POA: Diagnosis not present

## 2019-05-13 DIAGNOSIS — J3089 Other allergic rhinitis: Secondary | ICD-10-CM | POA: Diagnosis not present

## 2019-05-13 NOTE — Therapy (Signed)
[place], [place], [address] Phone: [phone]   Fax:  [phone]  Physical Therapy Treatment  Patient Details  Name: Virginia Luna MRN: [id number] Date of Birth: 02-28-1961 Referring Provider (PT): Dr Eunice Blase    Encounter Date: 05/12/2019  PT End of Session - 05/12/19 1338    Visit Number  14    Number of Visits  20    Date for PT Re-Evaluation  06/03/19    Authorization Type  third party payer    PT Start Time  1330    PT Stop Time  1411    PT Time Calculation (min)  41 min    Activity Tolerance  Patient tolerated treatment well    Behavior During Therapy  [facility] for tasks assessed/performed       Past Medical History:  Diagnosis Date  . Arthritis   . Asthma   . Chronic pain   . Diabetes mellitus without complication ([place])   . Fibromyalgia   . Sleep apnea    did use cpap-lost 25lb-says she does not need it  . Wears contact lenses     Past Surgical History:  Procedure Laterality Date  . BREAST BIOPSY    . BREAST EXCISIONAL BIOPSY    . BREAST LUMPECTOMY WITH RADIOACTIVE SEED LOCALIZATION Bilateral 11/24/2014   Procedure: BILATERAL BREAST LUMPECTOMY WITH RADIOACTIVE SEED LOCALIZATION;  Surgeon: [name], MD;  Location: [place];  Service: General;  Laterality: Bilateral;  . CHOLECYSTECTOMY    . COLONOSCOPY    . DILATION AND CURETTAGE OF UTERUS    . ESOPHAGOGASTRODUODENOSCOPY (EGD) WITH PROPOFOL N/A 07/23/2016   Procedure: ESOPHAGOGASTRODUODENOSCOPY (EGD) WITH PROPOFOL;  Surgeon: Laurence Spates, MD;  [facility];  Service: Endoscopy;  Laterality: N/A;  . ESOPHAGOGASTRODUODENOSCOPY (EGD) WITH PROPOFOL N/A 06/11/2018   Procedure: ESOPHAGOGASTRODUODENOSCOPY (EGD) WITH PROPOFOL;  Surgeon: Laurence Spates, MD;  [facility];  Service: Endoscopy;  Laterality: N/A;  . LUMBAR LAMINECTOMY  2010  . UMBILICAL HERNIA REPAIR     age 58  . UPPER GI ENDOSCOPY       There were no vitals filed for this visit.  Subjective Assessment - 05/12/19 1336    Subjective  Patient had some pain ealier but she took an oxy and her pain is down.    Limitations  Standing    How long can you sit comfortably?  Sitting for too long stiffens her knees    How long can you stand comfortably?  varies how long she can stand    How long can you walk comfortably?  trying to walk 5 min 3 times a day    Diagnostic tests  X-ray but results arent in the computer; Per patient bone spurs and arthritis.    Currently in Pain?  Yes    Pain Score  5     Pain Location  Knee    Pain Orientation  Right    Pain Descriptors / Indicators  Aching    Pain Type  Chronic pain    Pain Onset  More than a month ago    Pain Frequency  Intermittent    Aggravating Factors   standing and walking    Pain Relieving Factors  rest and ice    Multiple Pain Sites  No                       [facility] Adult PT Treatment/Exercise - 05/13/19 0001  Knee/Hip Exercises: Stretches   Active Hamstring Stretch Limitations  seated hamstring stretch 3x20 sec hold    3 deep breaths each side.      Knee/Hip Exercises: Aerobic   Nustep  5 min L3 cuing for pace       Knee/Hip Exercises: Standing   Heel Raises Limitations  x20     Forward Step Up  2 sets;10 reps    Functional Squat Limitations  squat with 30 lb keetle bell to chair. Chair used to keep knee from shooting too far forward.       Knee/Hip Exercises: Seated   Other Seated Knee/Hip Exercises  seated clamshell 2x15 green       Knee/Hip Exercises: Supine   Quad Sets Limitations  x20 bilateral 5 sec hold     Short Arc Quad Sets Limitations  x15 bilateral     Straight Leg Raises Limitations  x10 bilateral       Manual Therapy   Manual therapy comments  patella mobs     Soft tissue mobilization  to posterior knee     Passive ROM  into knee extension     McConnell  reviewed self taping              PT Education -  05/12/19 1337    Education Details  reviewed taping    Person(s) Educated  Patient    Methods  Explanation;Demonstration    Comprehension  Verbalized understanding;Returned demonstration;Verbal cues required;Tactile cues required       PT Short Term Goals - 05/13/19 0939      PT SHORT TERM GOAL #1   Title  Patient will be indepenent with basic HEP    Baseline  working on basic HEP    Time  3    Period  Weeks    Status  Achieved      PT SHORT TERM GOAL #2   Title  Patient will demonstrate a 50% improvement in patella movement    Baseline  signifjcant improvement bilateral    Time  3    Period  Weeks    Status  Achieved      PT SHORT TERM GOAL #3   Title  Patient will extend bilateral LE without increased pain    Baseline  imroving    Time  3    Period  Weeks    Status  On-going        PT Long Term Goals - 03/20/19 0802      PT LONG TERM GOAL #1   Title  Patient will transfer sit to stand independently without use of hands and without increased pain    Time  6    Period  Weeks    Status  New    Target Date  05/01/19      PT LONG TERM GOAL #2   Title  Patient will stand for 10 minutes without increased pain in order to perfrom ADL's    Time  6    Period  Weeks    Status  New    Target Date  05/01/19      PT LONG TERM GOAL #3   Title  Patient will ambualte 150' without increased pain with LRAD in order to improve ability to go to appointments    Time  6    Period  Weeks    Status  New    Target Date  05/01/19  Plan - 05/12/19 1600    Clinical Impression Statement  Patient is making good progress. She did better with SLR today. She was also able to work on stair training and functional lifting. She had no increase in pain. Therapy reviewed taping again. Her knee caps are both moving better. She was advised to continue stretching and strengthening at home.    Personal Factors and Comorbidities  Comorbidity 1;Comorbidity 2;Comorbidity 3+     Comorbidities  fibromaylgia, anxiety, obesity    Examination-Activity Limitations  Transfers;Locomotion Level;Sit;Sleep;Squat;Stairs;Stand    Stability/Clinical Decision Making  Stable/Uncomplicated    Clinical Decision Making  Moderate    Rehab Potential  Good    PT Frequency  2x / week    PT Duration  4 weeks    PT Treatment/Interventions  ADLs/Self Care Home Management;Cryotherapy;Electrical Stimulation;Moist Heat;Traction;Ultrasound;DME Instruction;Stair training;Functional mobility training;Therapeutic activities;Therapeutic exercise;Gait training;Neuromuscular re-education;Patient/family education;Passive range of motion;Manual techniques;Taping;Spinal Manipulations;Joint Manipulations    PT Next Visit Plan  continue to advance functional strengthening as tolerated.    PT Home Exercise Plan  quad set, seated clam with band, self patella mobs, supine heel slides, bridge, SAQ, LAQ sink squat, mini lunge forward, side and backward with UE support. hip hinge and deadlift on raised step    Consulted and Agree with Plan of Care  Patient       Patient will benefit from skilled therapeutic intervention in order to improve the following deficits and impairments:  Abnormal gait, Decreased range of motion, Difficulty walking, Obesity, Decreased endurance, Decreased mobility, Decreased strength, Improper body mechanics, Decreased activity tolerance, Pain  Visit Diagnosis: Chronic pain of left knee  Difficulty in walking, not elsewhere classified  Chronic pain of right knee  Muscle weakness (generalized)     Problem List There are no active problems to display for this patient.   Carney Living PT DPT  05/13/2019, 9:47 AM  [facility] [address] [place], [place], [address] Phone: [phone]   Fax:  [phone]  Name: Virginia Luna MRN: [id number] Date of Birth: [birth year]

## 2019-05-14 ENCOUNTER — Other Ambulatory Visit: Payer: Self-pay

## 2019-05-14 ENCOUNTER — Encounter: Payer: Self-pay | Admitting: Physical Therapy

## 2019-05-14 ENCOUNTER — Ambulatory Visit: Payer: PPO | Admitting: Physical Therapy

## 2019-05-14 DIAGNOSIS — G8929 Other chronic pain: Secondary | ICD-10-CM

## 2019-05-14 DIAGNOSIS — R262 Difficulty in walking, not elsewhere classified: Secondary | ICD-10-CM

## 2019-05-14 DIAGNOSIS — M25562 Pain in left knee: Secondary | ICD-10-CM | POA: Diagnosis not present

## 2019-05-14 DIAGNOSIS — M6281 Muscle weakness (generalized): Secondary | ICD-10-CM

## 2019-05-14 NOTE — Therapy (Signed)
[place], [place], [address] Phone: [phone]   Fax:  [phone]  Physical Therapy Treatment  Patient Details  Name: Virginia Luna MRN: [id number] Date of Birth: Nov 09, 1960 Referring Provider (PT): Dr Eunice Blase    Encounter Date: 05/14/2019  PT End of Session - 05/14/19 1339    Visit Number  15    Number of Visits  20    Date for PT Re-Evaluation  06/03/19    Authorization Type  third party payer    PT Start Time  1330    PT Stop Time  1412    PT Time Calculation (min)  42 min    Activity Tolerance  Patient tolerated treatment well    Behavior During Therapy  [facility] for tasks assessed/performed       Past Medical History:  Diagnosis Date  . Arthritis   . Asthma   . Chronic pain   . Diabetes mellitus without complication ([place])   . Fibromyalgia   . Sleep apnea    did use cpap-lost 25lb-says she does not need it  . Wears contact lenses     Past Surgical History:  Procedure Laterality Date  . BREAST BIOPSY    . BREAST EXCISIONAL BIOPSY    . BREAST LUMPECTOMY WITH RADIOACTIVE SEED LOCALIZATION Bilateral 11/24/2014   Procedure: BILATERAL BREAST LUMPECTOMY WITH RADIOACTIVE SEED LOCALIZATION;  Surgeon: [name], MD;  Location: [place];  Service: General;  Laterality: Bilateral;  . CHOLECYSTECTOMY    . COLONOSCOPY    . DILATION AND CURETTAGE OF UTERUS    . ESOPHAGOGASTRODUODENOSCOPY (EGD) WITH PROPOFOL N/A 07/23/2016   Procedure: ESOPHAGOGASTRODUODENOSCOPY (EGD) WITH PROPOFOL;  Surgeon: Laurence Spates, MD;  [facility];  Service: Endoscopy;  Laterality: N/A;  . ESOPHAGOGASTRODUODENOSCOPY (EGD) WITH PROPOFOL N/A 06/11/2018   Procedure: ESOPHAGOGASTRODUODENOSCOPY (EGD) WITH PROPOFOL;  Surgeon: Laurence Spates, MD;  [facility];  Service: Endoscopy;  Laterality: N/A;  . LUMBAR LAMINECTOMY  2010  . UMBILICAL HERNIA REPAIR     age 70  . UPPER GI ENDOSCOPY       There were no vitals filed for this visit.  Subjective Assessment - 05/14/19 1335    Subjective  Patient reports the night after her last visit she had difficulty standing up off the couch. She had 8/10 pain this morning but she took pain medicine boefore her visit.    Limitations  Standing    How long can you sit comfortably?  Sitting for too long stiffens her knees    How long can you stand comfortably?  varies how long she can stand    How long can you walk comfortably?  trying to walk 5 min 3 times a day    Diagnostic tests  X-ray but results arent in the computer; Per patient bone spurs and arthritis.    Currently in Pain?  Yes    Pain Score  7     Pain Orientation  Right    Pain Descriptors / Indicators  Aching    Pain Type  Chronic pain    Pain Onset  More than a month ago    Pain Frequency  Constant    Aggravating Factors   standing and walking    Pain Relieving Factors  rest and ice    Multiple Pain Sites  No    Pain Score  5    Pain Location  Knee    Pain Orientation  Right  Pain Descriptors / Indicators  Aching    Pain Type  Chronic pain    Pain Onset  More than a month ago    Pain Frequency  Constant    Aggravating Factors   standing and walking    Pain Relieving Factors  rest    Effect of Pain on Daily Activities  difficulty perfroming ADL's                       [facility] Adult PT Treatment/Exercise - 05/14/19 0001      Knee/Hip Exercises: Stretches   Active Hamstring Stretch Limitations  seated hamstring stretch 3x20 sec hold    3 deep breaths each side.      Knee/Hip Exercises: Aerobic   Nustep  6 min L3 cuing for pace       Knee/Hip Exercises: Standing   Heel Raises Limitations  x20     Forward Step Up  2 sets;10 reps    Other Standing Knee Exercises  Sit to stand 3,4,5 x no cuing required from slightly raised surface       Knee/Hip Exercises: Seated   Other Seated Knee/Hip Exercises  seated clamshell 2x15 blue       Knee/Hip  Exercises: Supine   Short Arc Quad Sets Limitations  x15 bilateral 1 lb     Straight Leg Raises Limitations  x10 bilateral       Ultrasound   Ultrasound Location  anterior right knee    Ultrasound Parameters  1.5 w/cm2 55mhz cont     Ultrasound Goals  Pain;Edema      Manual Therapy   Manual therapy comments  patella mobs     Soft tissue mobilization  to posterior knee     Passive ROM  into knee extension              PT Education - 05/14/19 1339    Education Details  reviewed [email or web address] with ther-ex    Person(s) Educated  Patient    Methods  Explanation;Demonstration;Tactile cues;Verbal cues    Comprehension  Verbalized understanding;Returned demonstration;Verbal cues required;Tactile cues required       PT Short Term Goals - 05/13/19 0939      PT SHORT TERM GOAL #1   Title  Patient will be indepenent with basic HEP    Baseline  working on basic HEP    Time  3    Period  Weeks    Status  Achieved      PT SHORT TERM GOAL #2   Title  Patient will demonstrate a 50% improvement in patella movement    Baseline  signifjcant improvement bilateral    Time  3    Period  Weeks    Status  Achieved      PT SHORT TERM GOAL #3   Title  Patient will extend bilateral LE without increased pain    Baseline  imroving    Time  3    Period  Weeks    Status  On-going        PT Long Term Goals - 03/20/19 0802      PT LONG TERM GOAL #1   Title  Patient will transfer sit to stand independently without use of hands and without increased pain    Time  6    Period  Weeks    Status  New    Target Date  05/01/19      PT LONG TERM GOAL #2   Title  Patient will stand for 10 minutes without increased pain in order to perfrom ADL's    Time  6    Period  Weeks    Status  New    Target Date  05/01/19      PT LONG TERM GOAL #3   Title  Patient will ambualte 150' without increased pain with LRAD in order to improve ability to go to appointments    Time  6    Period  Weeks     Status  New    Target Date  05/01/19            Plan - 05/14/19 1357    Clinical Impression Statement  Patient continues to make progress. Her left knee was able to go into full extension with less pain. She does continues to have high pain levels and is using pain medication to control the pain. She was advised that it may be time to go back to the MD to see what else can be done. She has 2 more weeks scheduled. In those 2 weeks we will continue to progress strengthening sand make sure that she has a good program for home. She has been working on her home exerciees. We willalso continue to work on right patella mobility.    Comorbidities  fibromaylgia, anxiety, obesity    Examination-Activity Limitations  Transfers;Locomotion Level;Sit;Sleep;Squat;Stairs;Stand    Stability/Clinical Decision Making  Stable/Uncomplicated    Clinical Decision Making  Moderate    Rehab Potential  Good    PT Frequency  2x / week    PT Duration  4 weeks    PT Treatment/Interventions  ADLs/Self Care Home Management;Cryotherapy;Electrical Stimulation;Moist Heat;Traction;Ultrasound;DME Instruction;Stair training;Functional mobility training;Therapeutic activities;Therapeutic exercise;Gait training;Neuromuscular re-education;Patient/family education;Passive range of motion;Manual techniques;Taping;Spinal Manipulations;Joint Manipulations    PT Next Visit Plan  continue to advance functional strengthening as tolerated.    PT Home Exercise Plan  quad set, seated clam with band, self patella mobs, supine heel slides, bridge, SAQ, LAQ sink squat, mini lunge forward, side and backward with UE support. hip hinge and deadlift on raised step    Consulted and Agree with Plan of Care  Patient       Patient will benefit from skilled therapeutic intervention in order to improve the following deficits and impairments:  Abnormal gait, Decreased range of motion, Difficulty walking, Obesity, Decreased endurance, Decreased mobility,  Decreased strength, Improper body mechanics, Decreased activity tolerance, Pain  Visit Diagnosis: Chronic pain of left knee  Difficulty in walking, not elsewhere classified  Chronic pain of right knee  Muscle weakness (generalized)     Problem List There are no active problems to display for this patient.   Carney Living  PT DPT 05/14/2019, 4:21 PM  [facility] [address] [place], [place], [address] Phone: [phone]   Fax:  [phone]  Name: RANDI JAQUES MRN: [id number] Date of Birth: June 03, 1961

## 2019-05-18 ENCOUNTER — Other Ambulatory Visit: Payer: Self-pay

## 2019-05-18 ENCOUNTER — Ambulatory Visit
Admission: RE | Admit: 2019-05-18 | Discharge: 2019-05-18 | Disposition: A | Discharge status: Home / Self Care | Payer: PPO | Source: Ambulatory Visit | Attending: Family Medicine | Admitting: Family Medicine

## 2019-05-18 DIAGNOSIS — Z1231 Encounter for screening mammogram for malignant neoplasm of breast: Secondary | ICD-10-CM | POA: Diagnosis not present

## 2019-05-19 ENCOUNTER — Encounter: Payer: Self-pay | Admitting: Physical Therapy

## 2019-05-19 ENCOUNTER — Ambulatory Visit: Payer: PPO | Admitting: Physical Therapy

## 2019-05-19 DIAGNOSIS — G8929 Other chronic pain: Secondary | ICD-10-CM

## 2019-05-19 DIAGNOSIS — R262 Difficulty in walking, not elsewhere classified: Secondary | ICD-10-CM

## 2019-05-19 DIAGNOSIS — M25562 Pain in left knee: Secondary | ICD-10-CM

## 2019-05-19 DIAGNOSIS — M6281 Muscle weakness (generalized): Secondary | ICD-10-CM

## 2019-05-20 ENCOUNTER — Encounter: Payer: Self-pay | Admitting: Physical Therapy

## 2019-05-20 DIAGNOSIS — J301 Allergic rhinitis due to pollen: Secondary | ICD-10-CM | POA: Diagnosis not present

## 2019-05-20 DIAGNOSIS — J3081 Allergic rhinitis due to animal (cat) (dog) hair and dander: Secondary | ICD-10-CM | POA: Diagnosis not present

## 2019-05-20 DIAGNOSIS — J3089 Other allergic rhinitis: Secondary | ICD-10-CM | POA: Diagnosis not present

## 2019-05-20 NOTE — Therapy (Signed)
[place], [place], [address] Phone: [phone]   Fax:  [phone]  Physical Therapy Treatment  Patient Details  Name: Virginia Luna MRN: [id number] Date of Birth: 28-Mar-1961 Referring Provider (PT): Dr Eunice Blase    Encounter Date: 05/19/2019  PT End of Session - 05/19/19 1337    Visit Number  16    Number of Visits  20    Date for PT Re-Evaluation  06/03/19    Authorization Type  third party payer    PT Start Time  1331    PT Stop Time  1412    PT Time Calculation (min)  41 min    Activity Tolerance  Patient tolerated treatment well    Behavior During Therapy  [facility] for tasks assessed/performed       Past Medical History:  Diagnosis Date  . Arthritis   . Asthma   . Chronic pain   . Diabetes mellitus without complication ([place])   . Fibromyalgia   . Sleep apnea    did use cpap-lost 25lb-says she does not need it  . Wears contact lenses     Past Surgical History:  Procedure Laterality Date  . BREAST BIOPSY    . BREAST EXCISIONAL BIOPSY    . BREAST LUMPECTOMY WITH RADIOACTIVE SEED LOCALIZATION Bilateral 11/24/2014   Procedure: BILATERAL BREAST LUMPECTOMY WITH RADIOACTIVE SEED LOCALIZATION;  Surgeon: [name], MD;  Location: [facility];  Service: General;  Laterality: Bilateral;  . CHOLECYSTECTOMY    . COLONOSCOPY    . DILATION AND CURETTAGE OF UTERUS    . ESOPHAGOGASTRODUODENOSCOPY (EGD) WITH PROPOFOL N/A 07/23/2016   Procedure: ESOPHAGOGASTRODUODENOSCOPY (EGD) WITH PROPOFOL;  Surgeon: Laurence Spates, MD;  [facility];  Service: Endoscopy;  Laterality: N/A;  . ESOPHAGOGASTRODUODENOSCOPY (EGD) WITH PROPOFOL N/A 06/11/2018   Procedure: ESOPHAGOGASTRODUODENOSCOPY (EGD) WITH PROPOFOL;  Surgeon: Laurence Spates, MD;  [facility];  Service: Endoscopy;  Laterality: N/A;  . LUMBAR LAMINECTOMY  2010  . UMBILICAL HERNIA REPAIR     age 23  . UPPER GI ENDOSCOPY       There were no vitals filed for this visit.  Subjective Assessment - 05/19/19 1334    Subjective  Patient reports her knees are not too bad today. She is having more pain in her left knee then right today.    Limitations  Standing    How long can you sit comfortably?  Sitting for too long stiffens her knees    How long can you stand comfortably?  varies how long she can stand    How long can you walk comfortably?  trying to walk 5 min 3 times a day    Diagnostic tests  X-ray but results arent in the computer; Per patient bone spurs and arthritis.    Currently in Pain?  Yes    Pain Score  5     Pain Location  Knee    Pain Orientation  Left    Pain Descriptors / Indicators  Aching    Pain Type  Chronic pain    Pain Onset  More than a month ago    Pain Frequency  Constant    Aggravating Factors   standing and walking    Pain Relieving Factors  rest and ice    Effect of Pain on Daily Activities  difficulty walking    Pain Score  4    Pain Location  Knee    Pain Orientation  Right  Pain Descriptors / Indicators  Aching    Pain Type  Chronic pain    Pain Onset  More than a month ago    Pain Frequency  Constant    Aggravating Factors   standing and walking    Pain Relieving Factors  rest    Effect of Pain on Daily Activities  difficulty perfroming ADL's                       [facility] Adult PT Treatment/Exercise - 05/20/19 0001      Knee/Hip Exercises: Stretches   Active Hamstring Stretch Limitations  seated hamstring stretch 3x20 sec hold    3 deep breaths each side.      Knee/Hip Exercises: Aerobic   Nustep  6 min L3 cuing for pace       Knee/Hip Exercises: Standing   Heel Raises Limitations  x20       Manual Therapy   Manual Therapy  Passive ROM;Taping;Joint mobilization    Manual therapy comments  patella mobs     Joint Mobilization  PA and AP gdes Grade II ands III with distraction     Soft tissue mobilization  to left anterior knee     Passive ROM   into knee extension              PT Education - 05/19/19 1336    Education Details  reviewed technique with ther-ex    Person(s) Educated  Patient    Methods  Explanation;Demonstration    Comprehension  Returned demonstration;Verbal cues required;Verbalized understanding;Tactile cues required       PT Short Term Goals - 05/13/19 0939      PT SHORT TERM GOAL #1   Title  Patient will be indepenent with basic HEP    Baseline  working on basic HEP    Time  3    Period  Weeks    Status  Achieved      PT SHORT TERM GOAL #2   Title  Patient will demonstrate a 50% improvement in patella movement    Baseline  signifjcant improvement bilateral    Time  3    Period  Weeks    Status  Achieved      PT SHORT TERM GOAL #3   Title  Patient will extend bilateral LE without increased pain    Baseline  imroving    Time  3    Period  Weeks    Status  On-going        PT Long Term Goals - 03/20/19 0802      PT LONG TERM GOAL #1   Title  Patient will transfer sit to stand independently without use of hands and without increased pain    Time  6    Period  Weeks    Status  New    Target Date  05/01/19      PT LONG TERM GOAL #2   Title  Patient will stand for 10 minutes without increased pain in order to perfrom ADL's    Time  6    Period  Weeks    Status  New    Target Date  05/01/19      PT LONG TERM GOAL #3   Title  Patient will ambualte 150' without increased pain with LRAD in order to improve ability to go to appointments    Time  6    Period  Weeks    Status  New  Target Date  05/01/19            Plan - 05/19/19 1408    Clinical Impression Statement  Patient continues to work hard on her exercises. her knee extension is improving per visual inspection and she is maintaining movement in bilateral patellas. She is tolerating standing exercises better. Therapy continues to use modalities and manual therapy to reduce pain levels. She reported relief in pain with  PA and AP glides today.    Personal Factors and Comorbidities  Comorbidity 1;Comorbidity 2;Comorbidity 3+    Comorbidities  fibromaylgia, anxiety, obesity    Examination-Activity Limitations  Transfers;Locomotion Level;Sit;Sleep;Squat;Stairs;Stand    Examination-Participation Restrictions  Community Activity;Cleaning;Laundry    Stability/Clinical Decision Making  Stable/Uncomplicated    Clinical Decision Making  Moderate    Rehab Potential  Good    PT Frequency  2x / week    PT Treatment/Interventions  ADLs/Self Care Home Management;Cryotherapy;Electrical Stimulation;Moist Heat;Traction;Ultrasound;DME Instruction;Stair training;Functional mobility training;Therapeutic activities;Therapeutic exercise;Gait training;Neuromuscular re-education;Patient/family education;Passive range of motion;Manual techniques;Taping;Spinal Manipulations;Joint Manipulations    PT Next Visit Plan  continue to advance functional strengthening as tolerated.    PT Home Exercise Plan  quad set, seated clam with band, self patella mobs, supine heel slides, bridge, SAQ, LAQ sink squat, mini lunge forward, side and backward with UE support. hip hinge and deadlift on raised step    Consulted and Agree with Plan of Care  Patient       Patient will benefit from skilled therapeutic intervention in order to improve the following deficits and impairments:  Abnormal gait, Decreased range of motion, Difficulty walking, Obesity, Decreased endurance, Decreased mobility, Decreased strength, Improper body mechanics, Decreased activity tolerance, Pain  Visit Diagnosis: Chronic pain of left knee  Difficulty in walking, not elsewhere classified  Chronic pain of right knee  Muscle weakness (generalized)     Problem List There are no active problems to display for this patient.   Carney Living PT DPT  05/20/2019, 8:41 AM  [facility] [address] [place], [place],  [address] Phone: [phone]   Fax:  [phone]  Name: SAJADA BRODIGAN MRN: [id number] Date of Birth: 12-02-1960

## 2019-05-21 ENCOUNTER — Other Ambulatory Visit: Payer: Self-pay

## 2019-05-21 ENCOUNTER — Encounter: Payer: Self-pay | Admitting: Physical Therapy

## 2019-05-21 ENCOUNTER — Ambulatory Visit: Payer: PPO | Attending: Family Medicine | Admitting: Physical Therapy

## 2019-05-21 DIAGNOSIS — M25562 Pain in left knee: Secondary | ICD-10-CM | POA: Insufficient documentation

## 2019-05-21 DIAGNOSIS — M6281 Muscle weakness (generalized): Secondary | ICD-10-CM | POA: Diagnosis not present

## 2019-05-21 DIAGNOSIS — R262 Difficulty in walking, not elsewhere classified: Secondary | ICD-10-CM | POA: Diagnosis not present

## 2019-05-21 DIAGNOSIS — G8929 Other chronic pain: Secondary | ICD-10-CM | POA: Insufficient documentation

## 2019-05-21 DIAGNOSIS — M25561 Pain in right knee: Secondary | ICD-10-CM | POA: Diagnosis not present

## 2019-05-22 ENCOUNTER — Encounter: Payer: Self-pay | Admitting: Physical Therapy

## 2019-05-22 NOTE — Therapy (Signed)
[place], [place], [address] Phone: [phone]   Fax:  [phone]  Physical Therapy Treatment  Patient Details  Name: Virginia Luna MRN: [id number] Date of Birth: Aug 08, 1961 Referring Provider (PT): Dr Eunice Blase    Encounter Date: 05/21/2019  PT End of Session - 05/21/19 1339    Visit Number  17    Number of Visits  20    Date for PT Re-Evaluation  06/03/19    Authorization Type  third party payer    PT Start Time  1300    PT Stop Time  1340    PT Time Calculation (min)  40 min    Activity Tolerance  Patient tolerated treatment well    Behavior During Therapy  [facility] for tasks assessed/performed       Past Medical History:  Diagnosis Date  . Arthritis   . Asthma   . Chronic pain   . Diabetes mellitus without complication ([place])   . Fibromyalgia   . Sleep apnea    did use cpap-lost 25lb-says she does not need it  . Wears contact lenses     Past Surgical History:  Procedure Laterality Date  . BREAST BIOPSY    . BREAST EXCISIONAL BIOPSY    . BREAST LUMPECTOMY WITH RADIOACTIVE SEED LOCALIZATION Bilateral 11/24/2014   Procedure: BILATERAL BREAST LUMPECTOMY WITH RADIOACTIVE SEED LOCALIZATION;  Surgeon: [name], MD;  Location: [facility];  Service: General;  Laterality: Bilateral;  . CHOLECYSTECTOMY    . COLONOSCOPY    . DILATION AND CURETTAGE OF UTERUS    . ESOPHAGOGASTRODUODENOSCOPY (EGD) WITH PROPOFOL N/A 07/23/2016   Procedure: ESOPHAGOGASTRODUODENOSCOPY (EGD) WITH PROPOFOL;  Surgeon: Laurence Spates, MD;  [facility];  Service: Endoscopy;  Laterality: N/A;  . ESOPHAGOGASTRODUODENOSCOPY (EGD) WITH PROPOFOL N/A 06/11/2018   Procedure: ESOPHAGOGASTRODUODENOSCOPY (EGD) WITH PROPOFOL;  Surgeon: Laurence Spates, MD;  [facility];  Service: Endoscopy;  Laterality: N/A;  . LUMBAR LAMINECTOMY  2010  . UMBILICAL HERNIA REPAIR     age 10  . UPPER GI ENDOSCOPY       There were no vitals filed for this visit.  Subjective Assessment - 05/21/19 1335    Subjective  Patient reports her knees held up pretty good after last visit. The pain was much higher this morning    Limitations  Standing    How long can you sit comfortably?  Sitting for too long stiffens her knees    How long can you stand comfortably?  varies how long she can stand    How long can you walk comfortably?  trying to walk 5 min 3 times a day    Diagnostic tests  X-ray but results arent in the computer; Per patient bone spurs and arthritis.    Currently in Pain?  Yes    Pain Score  3     Pain Location  Knee    Pain Orientation  Left    Pain Descriptors / Indicators  Aching    Pain Type  Chronic pain    Pain Radiating Towards  standing and walking    Pain Onset  More than a month ago    Pain Frequency  Constant    Aggravating Factors   standing and walking    Pain Relieving Factors  rest and ice    Effect of Pain on Daily Activities  difficulty walking    Pain Score  4    Pain Location  Knee  Pain Orientation  Right    Pain Descriptors / Indicators  Aching    Pain Type  Chronic pain    Pain Onset  More than a month ago    Pain Frequency  Constant    Aggravating Factors   standing and walking    Pain Relieving Factors  rest    Effect of Pain on Daily Activities  difficulty perfroming ADL's                       [facility] Adult PT Treatment/Exercise - 05/22/19 0001      Knee/Hip Exercises: Standing   Heel Raises Limitations  x20     Hip Flexion Limitations  x20     Lateral Step Up  1 set;10 reps;Both;Step Height: 4";Hand Hold: 2    Forward Step Up  10 reps;1 set;Step Height: 4";Hand Hold: 2;Both    Functional Squat Limitations  1/4 squat x20       Ultrasound   Ultrasound Goals  Pain;Edema      Manual Therapy   Manual Therapy  Passive ROM;Taping;Joint mobilization    Manual therapy comments  patella mobs     Joint Mobilization  PA and AP gdes Grade II  ands III with distraction     Soft tissue mobilization  to left anterior knee     Passive ROM  into knee extension     McConnell  reviewed self taping and taped bilateral knees              PT Education - 05/21/19 1339    Education Details  reviewed HE and symptom mangement    Person(s) Educated  Patient    Methods  Explanation;Demonstration;Tactile cues;Verbal cues    Comprehension  Verbalized understanding;Returned demonstration;Verbal cues required;Tactile cues required       PT Short Term Goals - 05/13/19 0939      PT SHORT TERM GOAL #1   Title  Patient will be indepenent with basic HEP    Baseline  working on basic HEP    Time  3    Period  Weeks    Status  Achieved      PT SHORT TERM GOAL #2   Title  Patient will demonstrate a 50% improvement in patella movement    Baseline  signifjcant improvement bilateral    Time  3    Period  Weeks    Status  Achieved      PT SHORT TERM GOAL #3   Title  Patient will extend bilateral LE without increased pain    Baseline  imroving    Time  3    Period  Weeks    Status  On-going        PT Long Term Goals - 03/20/19 0802      PT LONG TERM GOAL #1   Title  Patient will transfer sit to stand independently without use of hands and without increased pain    Time  6    Period  Weeks    Status  New    Target Date  05/01/19      PT LONG TERM GOAL #2   Title  Patient will stand for 10 minutes without increased pain in order to perfrom ADL's    Time  6    Period  Weeks    Status  New    Target Date  05/01/19      PT LONG TERM GOAL #3   Title  Patient  will ambualte 150' without increased pain with LRAD in order to improve ability to go to appointments    Time  6    Period  Weeks    Status  New    Target Date  05/01/19            Plan - 05/21/19 1340    Clinical Impression Statement  Patient continues to improve her ability to perfrom functional activity. Her pain still reacheds high levels but it is more  intermittent. Therapy ultrasounded her right leg today 2nd to inflammation in her right patellar tendon. Her hamstring mobility is improving.    Personal Factors and Comorbidities  Comorbidity 1;Comorbidity 2;Comorbidity 3+    Comorbidities  fibromaylgia, anxiety, obesity    Examination-Activity Limitations  Transfers;Locomotion Level;Sit;Sleep;Squat;Stairs;Stand    Stability/Clinical Decision Making  Stable/Uncomplicated    Clinical Decision Making  Moderate    Rehab Potential  Good    PT Frequency  2x / week    PT Duration  4 weeks    PT Treatment/Interventions  ADLs/Self Care Home Management;Cryotherapy;Electrical Stimulation;Moist Heat;Traction;Ultrasound;DME Instruction;Stair training;Functional mobility training;Therapeutic activities;Therapeutic exercise;Gait training;Neuromuscular re-education;Patient/family education;Passive range of motion;Manual techniques;Taping;Spinal Manipulations;Joint Manipulations    PT Next Visit Plan  continue to advance functional strengthening as tolerated.    PT Home Exercise Plan  quad set, seated clam with band, self patella mobs, supine heel slides, bridge, SAQ, LAQ sink squat, mini lunge forward, side and backward with UE support. hip hinge and deadlift on raised step    Consulted and Agree with Plan of Care  Patient       Patient will benefit from skilled therapeutic intervention in order to improve the following deficits and impairments:  Abnormal gait, Decreased range of motion, Difficulty walking, Obesity, Decreased endurance, Decreased mobility, Decreased strength, Improper body mechanics, Decreased activity tolerance, Pain  Visit Diagnosis: Chronic pain of left knee  Difficulty in walking, not elsewhere classified  Chronic pain of right knee  Muscle weakness (generalized)     Problem List There are no active problems to display for this patient.   Carney Living PT DPT  05/22/2019, 8:02 AM  [facility] [address] [place], [place], [address] Phone: [phone]   Fax:  [phone]  Name: Virginia Luna MRN: [id number] Date of Birth: [birth year]

## 2019-05-26 ENCOUNTER — Other Ambulatory Visit: Payer: Self-pay

## 2019-05-26 ENCOUNTER — Ambulatory Visit: Payer: PPO | Admitting: Physical Therapy

## 2019-05-26 ENCOUNTER — Encounter: Payer: Self-pay | Admitting: Physical Therapy

## 2019-05-26 DIAGNOSIS — M25562 Pain in left knee: Secondary | ICD-10-CM | POA: Diagnosis not present

## 2019-05-26 DIAGNOSIS — R262 Difficulty in walking, not elsewhere classified: Secondary | ICD-10-CM

## 2019-05-26 DIAGNOSIS — M25561 Pain in right knee: Secondary | ICD-10-CM

## 2019-05-26 DIAGNOSIS — M6281 Muscle weakness (generalized): Secondary | ICD-10-CM

## 2019-05-26 DIAGNOSIS — G8929 Other chronic pain: Secondary | ICD-10-CM

## 2019-05-26 NOTE — Therapy (Signed)
[place], [place], [address] Phone: [phone]   Fax:  [phone]  Physical Therapy Treatment  Patient Details  Name: Virginia Luna MRN: [id number] Date of Birth: 12-13-1960 Referring Provider (PT): Dr Eunice Blase    Encounter Date: 05/26/2019  PT End of Session - 05/26/19 1337    Visit Number  18    Number of Visits  20    Date for PT Re-Evaluation  06/03/19    Authorization Type  third party payer    PT Start Time  1330    PT Stop Time  1412    PT Time Calculation (min)  42 min    Activity Tolerance  Patient tolerated treatment well    Behavior During Therapy  [facility] for tasks assessed/performed       Past Medical History:  Diagnosis Date  . Arthritis   . Asthma   . Chronic pain   . Diabetes mellitus without complication ([place])   . Fibromyalgia   . Sleep apnea    did use cpap-lost 25lb-says she does not need it  . Wears contact lenses     Past Surgical History:  Procedure Laterality Date  . BREAST BIOPSY    . BREAST EXCISIONAL BIOPSY    . BREAST LUMPECTOMY WITH RADIOACTIVE SEED LOCALIZATION Bilateral 11/24/2014   Procedure: BILATERAL BREAST LUMPECTOMY WITH RADIOACTIVE SEED LOCALIZATION;  Surgeon: [name], MD;  Location: [facility];  Service: General;  Laterality: Bilateral;  . CHOLECYSTECTOMY    . COLONOSCOPY    . DILATION AND CURETTAGE OF UTERUS    . ESOPHAGOGASTRODUODENOSCOPY (EGD) WITH PROPOFOL N/A 07/23/2016   Procedure: ESOPHAGOGASTRODUODENOSCOPY (EGD) WITH PROPOFOL;  Surgeon: Laurence Spates, MD;  [facility];  Service: Endoscopy;  Laterality: N/A;  . ESOPHAGOGASTRODUODENOSCOPY (EGD) WITH PROPOFOL N/A 06/11/2018   Procedure: ESOPHAGOGASTRODUODENOSCOPY (EGD) WITH PROPOFOL;  Surgeon: Laurence Spates, MD;  [facility];  Service: Endoscopy;  Laterality: N/A;  . LUMBAR LAMINECTOMY  2010  . UMBILICAL HERNIA REPAIR     age 58  . UPPER GI ENDOSCOPY       There were no vitals filed for this visit.  Subjective Assessment - 05/26/19 1335    Subjective  Patient had a good weekend. She rested. Her pain is a little better today. She did not take her pain medication. Her right knee is hurting a little worse then the left.    Limitations  Standing    How long can you sit comfortably?  Sitting for too long stiffens her knees    How long can you stand comfortably?  varies how long she can stand    How long can you walk comfortably?  trying to walk 5 min 3 times a day    Diagnostic tests  X-ray but results arent in the computer; Per patient bone spurs and arthritis.    Currently in Pain?  Yes    Pain Score  4     Pain Location  Knee    Pain Orientation  Right    Pain Descriptors / Indicators  Aching    Pain Type  Chronic pain    Pain Radiating Towards  standing and walking    Pain Onset  More than a month ago    Pain Frequency  Constant    Aggravating Factors   standing and walking    Pain Relieving Factors  rest    Effect of Pain on Daily Activities  difficulty walking  [facility] Adult PT Treatment/Exercise - 05/26/19 0001      Knee/Hip Exercises: Stretches   Active Hamstring Stretch Limitations  seated hamstring stretch 3x20 sec hold       Knee/Hip Exercises: Standing   Heel Raises Limitations  x20     Hip Flexion Limitations  x20     Lateral Step Up  1 set;10 reps;Both;Step Height: 4";Hand Hold: 2    Forward Step Up  10 reps;1 set;Step Height: 4";Hand Hold: 2;Both    Functional Squat Limitations  1/4 squat x20       Knee/Hip Exercises: Supine   Straight Leg Raises Limitations  x10 bilateral 1lb right no weight left       Ultrasound   Ultrasound Location  patella tendon right     Ultrasound Parameters  1.5 w/cm 2 50%     Ultrasound Goals  Pain;Edema      Manual Therapy   Manual therapy comments  patella mobs     Joint Mobilization  PA and AP glides Grade II ands III with distraction      Soft tissue mobilization  to left anterior knee     Passive ROM  into knee extension              PT Education - 05/26/19 1337    Education Details  reviewed HEP    Person(s) Educated  Patient    Methods  Explanation;Demonstration    Comprehension  Verbalized understanding;Verbal cues required;Returned demonstration;Tactile cues required       PT Short Term Goals - 05/26/19 1701      PT SHORT TERM GOAL #1   Title  Patient will be indepenent with basic HEP    Baseline  working on basic HEP    Time  3    Period  Weeks    Status  Achieved    Target Date  04/10/19      PT SHORT TERM GOAL #2   Title  Patient will demonstrate a 50% improvement in patella movement    Baseline  signifjcant improvement bilateral    Time  3    Period  Weeks    Status  Achieved    Target Date  04/10/19      PT SHORT TERM GOAL #3   Title  Patient will extend bilateral LE without increased pain    Time  3    Period  Weeks    Status  On-going    Target Date  04/10/19        PT Long Term Goals - 03/20/19 0802      PT LONG TERM GOAL #1   Title  Patient will transfer sit to stand independently without use of hands and without increased pain    Time  6    Period  Weeks    Status  New    Target Date  05/01/19      PT LONG TERM GOAL #2   Title  Patient will stand for 10 minutes without increased pain in order to perfrom ADL's    Time  6    Period  Weeks    Status  New    Target Date  05/01/19      PT LONG TERM GOAL #3   Title  Patient will ambualte 150' without increased pain with LRAD in order to improve ability to go to appointments    Time  6    Period  Weeks    Status  New  Target Date  05/01/19            Plan - 05/26/19 1338    Clinical Impression Statement  Patient had pain in her patella tendon area on the right today. She reported improved pain with manual therapy to the tendon and ultrasound. Her knee cap movement is better on the left now then the right her. left  knee extension was full today without pain for the first time. Therapy also added 1lb weights to SAQ. The patient is making progress.    Personal Factors and Comorbidities  Comorbidity 1;Comorbidity 2;Comorbidity 3+    Comorbidities  fibromaylgia, anxiety, obesity    Examination-Participation Restrictions  Community Activity;Cleaning;Laundry    Stability/Clinical Decision Making  Stable/Uncomplicated    Clinical Decision Making  Moderate    Rehab Potential  Good    PT Frequency  2x / week    PT Duration  4 weeks    PT Treatment/Interventions  ADLs/Self Care Home Management;Cryotherapy;Electrical Stimulation;Moist Heat;Traction;Ultrasound;DME Instruction;Stair training;Functional mobility training;Therapeutic activities;Therapeutic exercise;Gait training;Neuromuscular re-education;Patient/family education;Passive range of motion;Manual techniques;Taping;Spinal Manipulations;Joint Manipulations    PT Next Visit Plan  continue to advance functional strengthening as tolerated.    PT Home Exercise Plan  quad set, seated clam with band, self patella mobs, supine heel slides, bridge, SAQ, LAQ sink squat, mini lunge forward, side and backward with UE support. hip hinge and deadlift on raised step    Consulted and Agree with Plan of Care  Patient       Patient will benefit from skilled therapeutic intervention in order to improve the following deficits and impairments:  Abnormal gait, Decreased range of motion, Difficulty walking, Obesity, Decreased endurance, Decreased mobility, Decreased strength, Improper body mechanics, Decreased activity tolerance, Pain  Visit Diagnosis: Chronic pain of left knee  Difficulty in walking, not elsewhere classified  Chronic pain of right knee  Muscle weakness (generalized)     Problem List There are no active problems to display for this patient.   Carney Living PT DPT  05/26/2019, 5:04 PM  [facility] [address] [place], [place], [address] Phone: [phone]   Fax:  [phone]  Name: QUINITA HOOK MRN: [id number] Date of Birth: [birth year]

## 2019-05-27 DIAGNOSIS — J301 Allergic rhinitis due to pollen: Secondary | ICD-10-CM | POA: Diagnosis not present

## 2019-05-27 DIAGNOSIS — J3089 Other allergic rhinitis: Secondary | ICD-10-CM | POA: Diagnosis not present

## 2019-05-27 DIAGNOSIS — J3081 Allergic rhinitis due to animal (cat) (dog) hair and dander: Secondary | ICD-10-CM | POA: Diagnosis not present

## 2019-05-28 ENCOUNTER — Other Ambulatory Visit: Payer: Self-pay

## 2019-05-28 ENCOUNTER — Encounter: Payer: Self-pay | Admitting: Physical Therapy

## 2019-05-28 ENCOUNTER — Ambulatory Visit: Payer: PPO | Admitting: Physical Therapy

## 2019-05-28 DIAGNOSIS — R262 Difficulty in walking, not elsewhere classified: Secondary | ICD-10-CM

## 2019-05-28 DIAGNOSIS — G8929 Other chronic pain: Secondary | ICD-10-CM

## 2019-05-28 DIAGNOSIS — M6281 Muscle weakness (generalized): Secondary | ICD-10-CM

## 2019-05-28 DIAGNOSIS — M25561 Pain in right knee: Secondary | ICD-10-CM

## 2019-05-28 DIAGNOSIS — M25562 Pain in left knee: Secondary | ICD-10-CM | POA: Diagnosis not present

## 2019-05-29 ENCOUNTER — Encounter: Payer: Self-pay | Admitting: Physical Therapy

## 2019-05-29 NOTE — Therapy (Signed)
[place], [place], [address] Phone: [phone]   Fax:  [phone]  Physical Therapy Treatment  Patient Details  Name: Virginia Luna MRN: [id number] Date of Birth: 05/02/1961 Referring Provider (PT): Dr Eunice Blase    Encounter Date: 05/28/2019  PT End of Session - 05/28/19 1409    Visit Number  19    Number of Visits  20    Date for PT Re-Evaluation  06/03/19    Authorization Type  third party payer    PT Start Time  1330    PT Stop Time  1413    PT Time Calculation (min)  43 min    Activity Tolerance  Patient tolerated treatment well    Behavior During Therapy  [facility] for tasks assessed/performed       Past Medical History:  Diagnosis Date  . Arthritis   . Asthma   . Chronic pain   . Diabetes mellitus without complication ([place])   . Fibromyalgia   . Sleep apnea    did use cpap-lost 25lb-says she does not need it  . Wears contact lenses     Past Surgical History:  Procedure Laterality Date  . BREAST BIOPSY    . BREAST EXCISIONAL BIOPSY    . BREAST LUMPECTOMY WITH RADIOACTIVE SEED LOCALIZATION Bilateral 11/24/2014   Procedure: BILATERAL BREAST LUMPECTOMY WITH RADIOACTIVE SEED LOCALIZATION;  Surgeon: [name], MD;  Location: [place];  Service: General;  Laterality: Bilateral;  . CHOLECYSTECTOMY    . COLONOSCOPY    . DILATION AND CURETTAGE OF UTERUS    . ESOPHAGOGASTRODUODENOSCOPY (EGD) WITH PROPOFOL N/A 07/23/2016   Procedure: ESOPHAGOGASTRODUODENOSCOPY (EGD) WITH PROPOFOL;  Surgeon: Laurence Spates, MD;  Location: [facility];  Service: Endoscopy;  Laterality: N/A;  . ESOPHAGOGASTRODUODENOSCOPY (EGD) WITH PROPOFOL N/A 06/11/2018   Procedure: ESOPHAGOGASTRODUODENOSCOPY (EGD) WITH PROPOFOL;  Surgeon: Laurence Spates, MD;  Location: [facility];  Service: Endoscopy;  Laterality: N/A;  . LUMBAR LAMINECTOMY  2010  . UMBILICAL HERNIA REPAIR     age 58  . UPPER GI ENDOSCOPY       There were no vitals filed for this visit.  Subjective Assessment - 05/28/19 1334    Subjective  Patient reports her left knee pain is about a 5/10 at this time. Her right knee is doing well. She has been working on her left knee extension.    Limitations  Standing    How long can you sit comfortably?  Sitting for too long stiffens her knees    How long can you stand comfortably?  varies how long she can stand    How long can you walk comfortably?  trying to walk 5 min 3 times a day    Diagnostic tests  X-ray but results arent in the computer; Per patient bone spurs and arthritis.    Currently in Pain?  Yes    Pain Score  4     Pain Location  Knee    Pain Orientation  Left    Pain Descriptors / Indicators  Aching    Pain Type  Chronic pain    Pain Radiating Towards  standing and walking    Pain Onset  More than a month ago    Pain Frequency  Constant    Aggravating Factors   standing and walking    Pain Relieving Factors  rest    Effect of Pain on Daily Activities  difficulty walking  PT Education - 05/28/19 1358    Education Details  technique with ther-ex    Person(s) Educated  Patient    Methods  Explanation;Demonstration;Tactile cues;Verbal cues    Comprehension  Verbalized understanding;Returned demonstration;Verbal cues required;Tactile cues required       PT Short Term Goals - 05/26/19 1701      PT SHORT TERM GOAL #1   Title  Patient will be indepenent with basic HEP    Baseline  working on basic HEP    Time  3    Period  Weeks    Status  Achieved    Target Date  04/10/19      PT SHORT TERM GOAL #2   Title  Patient will demonstrate a 50% improvement in patella movement    Baseline  signifjcant improvement bilateral    Time  3    Period  Weeks    Status  Achieved    Target Date  04/10/19      PT SHORT TERM GOAL #3   Title  Patient will extend bilateral LE without increased pain    Time  3    Period   Weeks    Status  On-going    Target Date  04/10/19        PT Long Term Goals - 05/29/19 0803      PT LONG TERM GOAL #1   Title  Patient will transfer sit to stand independently without use of hands and without increased pain    Time  6    Period  Weeks    Status  On-going      PT LONG TERM GOAL #2   Title  Patient will stand for 10 minutes without increased pain in order to perfrom ADL's    Time  6    Period  Weeks    Status  On-going      PT LONG TERM GOAL #3   Title  Patient will ambualte 150' without increased pain with LRAD in order to improve ability to go to appointments    Time  6    Period  Weeks    Status  On-going            Plan - 05/28/19 1456    Clinical Impression Statement  Patient continues to work on her strengthening. Therapy added instability work today. She had no increase in pain. she had more pain on the left. Therapy also increased the height of her step. It was harder for her but tolerable. She has not been taking her pain meds.    Personal Factors and Comorbidities  Comorbidity 1;Comorbidity 2;Comorbidity 3+    Comorbidities  fibromaylgia, anxiety, obesity    Examination-Participation Restrictions  Community Activity;Cleaning;Laundry    Stability/Clinical Decision Making  Stable/Uncomplicated    Clinical Decision Making  Moderate    Rehab Potential  Good    PT Frequency  2x / week    PT Duration  4 weeks    PT Treatment/Interventions  ADLs/Self Care Home Management;Cryotherapy;Electrical Stimulation;Moist Heat;Traction;Ultrasound;DME Instruction;Stair training;Functional mobility training;Therapeutic activities;Therapeutic exercise;Gait training;Neuromuscular re-education;Patient/family education;Passive range of motion;Manual techniques;Taping;Spinal Manipulations;Joint Manipulations    PT Next Visit Plan  continue to advance functional strengthening as tolerated.    PT Home Exercise Plan  quad set, seated clam with band, self patella mobs,  supine heel slides, bridge, SAQ, LAQ sink squat, mini lunge forward, side and backward with UE support. hip hinge and deadlift on raised step    Consulted and Agree with Plan  of Care  Patient       Patient will benefit from skilled therapeutic intervention in order to improve the following deficits and impairments:  Abnormal gait, Decreased range of motion, Difficulty walking, Obesity, Decreased endurance, Decreased mobility, Decreased strength, Improper body mechanics, Decreased activity tolerance, Pain  Visit Diagnosis: Chronic pain of left knee  Difficulty in walking, not elsewhere classified  Chronic pain of right knee  Muscle weakness (generalized)     Problem List There are no active problems to display for this patient.   Carney Living PT DPT  05/29/2019, 8:06 AM  [facility] [address] [place], [place], [address] Phone: [phone]   Fax:  [phone]  Name: Virginia Luna MRN: [id number] Date of Birth: 01/09/1961

## 2019-05-31 ENCOUNTER — Other Ambulatory Visit: Payer: Self-pay | Admitting: Physician Assistant

## 2019-05-31 NOTE — Telephone Encounter (Signed)
Due back 09/2019

## 2019-06-02 ENCOUNTER — Other Ambulatory Visit: Payer: Self-pay

## 2019-06-02 ENCOUNTER — Ambulatory Visit: Payer: PPO | Admitting: Physical Therapy

## 2019-06-02 DIAGNOSIS — R262 Difficulty in walking, not elsewhere classified: Secondary | ICD-10-CM

## 2019-06-02 DIAGNOSIS — M25562 Pain in left knee: Secondary | ICD-10-CM | POA: Diagnosis not present

## 2019-06-02 DIAGNOSIS — G8929 Other chronic pain: Secondary | ICD-10-CM

## 2019-06-02 DIAGNOSIS — M6281 Muscle weakness (generalized): Secondary | ICD-10-CM

## 2019-06-02 DIAGNOSIS — M25561 Pain in right knee: Secondary | ICD-10-CM

## 2019-06-03 ENCOUNTER — Encounter: Payer: Self-pay | Admitting: Physical Therapy

## 2019-06-03 DIAGNOSIS — G43909 Migraine, unspecified, not intractable, without status migrainosus: Secondary | ICD-10-CM | POA: Diagnosis not present

## 2019-06-03 DIAGNOSIS — I10 Essential (primary) hypertension: Secondary | ICD-10-CM | POA: Diagnosis not present

## 2019-06-03 DIAGNOSIS — J4520 Mild intermittent asthma, uncomplicated: Secondary | ICD-10-CM | POA: Diagnosis not present

## 2019-06-03 DIAGNOSIS — E119 Type 2 diabetes mellitus without complications: Secondary | ICD-10-CM | POA: Diagnosis not present

## 2019-06-03 DIAGNOSIS — G894 Chronic pain syndrome: Secondary | ICD-10-CM | POA: Diagnosis not present

## 2019-06-03 DIAGNOSIS — J3081 Allergic rhinitis due to animal (cat) (dog) hair and dander: Secondary | ICD-10-CM | POA: Diagnosis not present

## 2019-06-03 DIAGNOSIS — J3089 Other allergic rhinitis: Secondary | ICD-10-CM | POA: Diagnosis not present

## 2019-06-03 DIAGNOSIS — K219 Gastro-esophageal reflux disease without esophagitis: Secondary | ICD-10-CM | POA: Diagnosis not present

## 2019-06-03 DIAGNOSIS — J301 Allergic rhinitis due to pollen: Secondary | ICD-10-CM | POA: Diagnosis not present

## 2019-06-03 DIAGNOSIS — M797 Fibromyalgia: Secondary | ICD-10-CM | POA: Diagnosis not present

## 2019-06-03 DIAGNOSIS — E7800 Pure hypercholesterolemia, unspecified: Secondary | ICD-10-CM | POA: Diagnosis not present

## 2019-06-03 NOTE — Therapy (Signed)
[place], [place], [address] Phone: [phone]   Fax:  [phone]  Physical Therapy Treatment  Patient Details  Name: Virginia Luna MRN: [id number] Date of Birth: [birth year] Referring Provider (PT): Dr Eunice Blase    Encounter Date: 06/02/2019  PT End of Session - 06/03/19 0910    Visit Number  20    Number of Visits  20    Date for PT Re-Evaluation  06/03/19    Authorization Type  third party payer    PT Start Time  1330    PT Stop Time  1414    PT Time Calculation (min)  44 min    Activity Tolerance  Patient tolerated treatment well    Behavior During Therapy  [facility] for tasks assessed/performed       Past Medical History:  Diagnosis Date  . Arthritis   . Asthma   . Chronic pain   . Diabetes mellitus without complication ([place])   . Fibromyalgia   . Sleep apnea    did use cpap-lost 25lb-says she does not need it  . Wears contact lenses     Past Surgical History:  Procedure Laterality Date  . BREAST BIOPSY    . BREAST EXCISIONAL BIOPSY    . BREAST LUMPECTOMY WITH RADIOACTIVE SEED LOCALIZATION Bilateral 11/24/2014   Procedure: BILATERAL BREAST LUMPECTOMY WITH RADIOACTIVE SEED LOCALIZATION;  Surgeon: [name], MD;  Location: [place];  Service: General;  Laterality: Bilateral;  . CHOLECYSTECTOMY    . COLONOSCOPY    . DILATION AND CURETTAGE OF UTERUS    . ESOPHAGOGASTRODUODENOSCOPY (EGD) WITH PROPOFOL N/A 07/23/2016   Procedure: ESOPHAGOGASTRODUODENOSCOPY (EGD) WITH PROPOFOL;  Surgeon: Laurence Spates, MD;  [facility];  Service: Endoscopy;  Laterality: N/A;  . ESOPHAGOGASTRODUODENOSCOPY (EGD) WITH PROPOFOL N/A 06/11/2018   Procedure: ESOPHAGOGASTRODUODENOSCOPY (EGD) WITH PROPOFOL;  Surgeon: Laurence Spates, MD;  [facility];  Service: Endoscopy;  Laterality: N/A;  . LUMBAR LAMINECTOMY  2010  . UMBILICAL HERNIA REPAIR     age 58  . UPPER GI ENDOSCOPY       There were no vitals filed for this visit.  Subjective Assessment - 06/03/19 0908    Subjective  Patient reports her knees have been pretty good. She continues to be around a 4/10 pain but she is walking better and is taking less pain medication.    Limitations  Standing    How long can you sit comfortably?  Sitting for too long stiffens her knees    How long can you stand comfortably?  varies how long she can stand    How long can you walk comfortably?  trying to walk 5 min 3 times a day    Diagnostic tests  X-ray but results arent in the computer; Per patient bone spurs and arthritis.    Currently in Pain?  Yes    Pain Score  4     Pain Location  Knee    Pain Orientation  Left    Pain Descriptors / Indicators  Aching    Pain Type  Chronic pain    Pain Radiating Towards  standing and walking    Pain Onset  More than a month ago    Pain Frequency  Constant    Aggravating Factors   standing and walking    Pain Relieving Factors  rest    Effect of Pain on Daily Activities  difficulty walking  PT Education - 06/03/19 0909    Education Details  technique with steps    Person(s) Educated  Patient    Methods  Explanation;Demonstration;Verbal cues;Tactile cues    Comprehension  Verbalized understanding;Verbal cues required;Tactile cues required;Returned demonstration       PT Short Term Goals - 05/26/19 1701      PT SHORT TERM GOAL #1   Title  Patient will be indepenent with basic HEP    Baseline  working on basic HEP    Time  3    Period  Weeks    Status  Achieved    Target Date  04/10/19      PT SHORT TERM GOAL #2   Title  Patient will demonstrate a 50% improvement in patella movement    Baseline  signifjcant improvement bilateral    Time  3    Period  Weeks    Status  Achieved    Target Date  04/10/19      PT SHORT TERM GOAL #3   Title  Patient will extend bilateral LE without increased pain    Time  3     Period  Weeks    Status  On-going    Target Date  04/10/19        PT Long Term Goals - 05/29/19 0803      PT LONG TERM GOAL #1   Title  Patient will transfer sit to stand independently without use of hands and without increased pain    Time  6    Period  Weeks    Status  On-going      PT LONG TERM GOAL #2   Title  Patient will stand for 10 minutes without increased pain in order to perfrom ADL's    Time  6    Period  Weeks    Status  On-going      PT LONG TERM GOAL #3   Title  Patient will ambualte 150' without increased pain with LRAD in order to improve ability to go to appointments    Time  6    Period  Weeks    Status  On-going            Plan - 06/03/19 0911    Clinical Impression Statement  Patients left knee was at full extension without a significant increase in pain today. She also was able to continue tolerating a 6 inch step. She is making good progress. Therapy will continue to advanace exercises as tolerated. Therapy will perfrom a re-assessment next visit. She has one more week scheduled then we will likley reduce her to 1x aweek for 3 weeks.    Personal Factors and Comorbidities  Comorbidity 1;Comorbidity 2;Comorbidity 3+    Comorbidities  fibromaylgia, anxiety, obesity    Examination-Activity Limitations  Transfers;Locomotion Level;Sit;Sleep;Squat;Stairs;Stand    Examination-Participation Restrictions  Community Activity;Cleaning;Laundry    Stability/Clinical Decision Making  Stable/Uncomplicated    Clinical Decision Making  Moderate    Rehab Potential  Good    PT Frequency  2x / week    PT Duration  4 weeks    PT Treatment/Interventions  ADLs/Self Care Home Management;Cryotherapy;Electrical Stimulation;Moist Heat;Traction;Ultrasound;DME Instruction;Stair training;Functional mobility training;Therapeutic activities;Therapeutic exercise;Gait training;Neuromuscular re-education;Patient/family education;Passive range of motion;Manual techniques;Taping;Spinal  Manipulations;Joint Manipulations    PT Next Visit Plan  continue to advance functional strengthening as tolerated.    PT Home Exercise Plan  quad set, seated clam with band, self patella mobs, supine heel slides, bridge, SAQ, LAQ sink squat, mini  lunge forward, side and backward with UE support. hip hinge and deadlift on raised step    Consulted and Agree with Plan of Care  Patient       Patient will benefit from skilled therapeutic intervention in order to improve the following deficits and impairments:  Abnormal gait, Decreased range of motion, Difficulty walking, Obesity, Decreased endurance, Decreased mobility, Decreased strength, Improper body mechanics, Decreased activity tolerance, Pain  Visit Diagnosis: Chronic pain of left knee  Difficulty in walking, not elsewhere classified  Chronic pain of right knee  Muscle weakness (generalized)     Problem List There are no active problems to display for this patient.   Carney Living PT DPT  06/03/2019, 9:16 AM  [facility] [address] [place], [place], [address] Phone: [phone]   Fax:  [phone]  Name: Virginia Luna MRN: [id number] Date of Birth: 12-06-1960

## 2019-06-09 ENCOUNTER — Other Ambulatory Visit: Payer: Self-pay

## 2019-06-09 ENCOUNTER — Ambulatory Visit: Payer: PPO | Admitting: Physical Therapy

## 2019-06-09 DIAGNOSIS — G8929 Other chronic pain: Secondary | ICD-10-CM

## 2019-06-09 DIAGNOSIS — M25562 Pain in left knee: Secondary | ICD-10-CM

## 2019-06-09 DIAGNOSIS — R262 Difficulty in walking, not elsewhere classified: Secondary | ICD-10-CM

## 2019-06-09 DIAGNOSIS — M6281 Muscle weakness (generalized): Secondary | ICD-10-CM

## 2019-06-09 DIAGNOSIS — M25561 Pain in right knee: Secondary | ICD-10-CM

## 2019-06-09 NOTE — Therapy (Signed)
[place], [place], [address] Phone: [phone]   Fax:  [phone]  Physical Therapy Treatment/Re-eval   Patient Details  Name: Virginia Luna MRN: [id number] Date of Birth: [birth year] Referring Provider (PT): Dr Eunice Blase    Encounter Date: 06/09/2019  PT End of Session - 06/09/19 1531    Visit Number  21    Number of Visits  25    Date for PT Re-Evaluation  07/07/19    Authorization Type  third party payer    PT Start Time  1333    PT Stop Time  1411    PT Time Calculation (min)  38 min    Activity Tolerance  Patient tolerated treatment well    Behavior During Therapy  [facility] for tasks assessed/performed       Past Medical History:  Diagnosis Date  . Arthritis   . Asthma   . Chronic pain   . Diabetes mellitus without complication ([place])   . Fibromyalgia   . Sleep apnea    did use cpap-lost 25lb-says she does not need it  . Wears contact lenses     Past Surgical History:  Procedure Laterality Date  . BREAST BIOPSY    . BREAST EXCISIONAL BIOPSY    . BREAST LUMPECTOMY WITH RADIOACTIVE SEED LOCALIZATION Bilateral 11/24/2014   Procedure: BILATERAL BREAST LUMPECTOMY WITH RADIOACTIVE SEED LOCALIZATION;  Surgeon: [name], MD;  Location: [place];  Service: General;  Laterality: Bilateral;  . CHOLECYSTECTOMY    . COLONOSCOPY    . DILATION AND CURETTAGE OF UTERUS    . ESOPHAGOGASTRODUODENOSCOPY (EGD) WITH PROPOFOL N/A 07/23/2016   Procedure: ESOPHAGOGASTRODUODENOSCOPY (EGD) WITH PROPOFOL;  Surgeon: Laurence Spates, MD;  [facility];  Service: Endoscopy;  Laterality: N/A;  . ESOPHAGOGASTRODUODENOSCOPY (EGD) WITH PROPOFOL N/A 06/11/2018   Procedure: ESOPHAGOGASTRODUODENOSCOPY (EGD) WITH PROPOFOL;  Surgeon: Laurence Spates, MD;  [facility];  Service: Endoscopy;  Laterality: N/A;  . LUMBAR LAMINECTOMY  2010  . UMBILICAL HERNIA REPAIR     age 42  . UPPER GI  ENDOSCOPY      There were no vitals filed for this visit.      [facility] PT Assessment - 06/09/19 0001      Observation/Other Assessments   Focus on Therapeutic Outcomes (FOTO)   49% limitation       Strength   Left Hip Flexion  4+/5    Left Hip ABduction  4+/5    Left Hip ADduction  4+/5    Right Knee Flexion  4+/5    Right Knee Extension  4+/5    Left Knee Flexion  4+/5    Left Knee Extension  4+/5      Palpation   Patella mobility  continues to demonstrate good pateall movement                              PT Short Term Goals - 06/09/19 1523      PT SHORT TERM GOAL #1   Title  Patient will be indepenent with basic HEP    Baseline  working on basic HEP    Time  3    Period  Weeks    Status  Achieved      PT SHORT TERM GOAL #2   Title  Patient will demonstrate a 50% improvement in patella movement    Baseline  signifjcant improvement bilateral    Time  3    Period  Weeks    Status  On-going    Target Date  04/10/19      PT SHORT TERM GOAL #3   Baseline  imroving    Time  3    Period  Weeks    Status  On-going    Target Date  04/10/19        PT Long Term Goals - 05/29/19 0803      PT LONG TERM GOAL #1   Title  Patient will transfer sit to stand independently without use of hands and without increased pain    Time  6    Period  Weeks    Status  On-going      PT LONG TERM GOAL #2   Title  Patient will stand for 10 minutes without increased pain in order to perfrom ADL's    Time  6    Period  Weeks    Status  On-going      PT LONG TERM GOAL #3   Title  Patient will ambualte 150' without increased pain with LRAD in order to improve ability to go to appointments    Time  6    Period  Weeks    Status  On-going            Plan - 06/09/19 1520    Clinical Impression Statement  Pateint has mad progress. Her FOTO has improved significantly. Her strength has improved and she is near full extension. She still havs pain when she  is up on her feet for too long. She reports it feels like a tooth ache. She is working hard at home. She would benefit from further therapy for 1W4 to continue to improve her functiona nd ability to ambulate. Limited exercises perfromed today 2nd to re-eval.    Personal Factors and Comorbidities  Comorbidity 1;Comorbidity 2;Comorbidity 3+    Comorbidities  fibromaylgia, anxiety, obesity    Examination-Activity Limitations  Transfers;Locomotion Level;Sit;Sleep;Squat;Stairs;Stand    Examination-Participation Restrictions  Community Activity;Cleaning;Laundry    Stability/Clinical Decision Making  Stable/Uncomplicated    Clinical Decision Making  Moderate    Rehab Potential  Good    PT Frequency  1x / week    PT Duration  4 weeks    PT Treatment/Interventions  ADLs/Self Care Home Management;Cryotherapy;Electrical Stimulation;Moist Heat;Traction;Ultrasound;DME Instruction;Stair training;Functional mobility training;Therapeutic activities;Therapeutic exercise;Gait training;Neuromuscular re-education;Patient/family education;Passive range of motion;Manual techniques;Taping;Spinal Manipulations;Joint Manipulations    PT Next Visit Plan  continue to advance functional strengthening as tolerated. Continue standing exercises. Ween off modalities. Continue with knee extension strengthening.    PT Home Exercise Plan  quad set, seated clam with band, self patella mobs, supine heel slides, bridge, SAQ, LAQ sink squat, mini lunge forward, side and backward with UE support. hip hinge and deadlift on raised step    Consulted and Agree with Plan of Care  Patient       Patient will benefit from skilled therapeutic intervention in order to improve the following deficits and impairments:  Abnormal gait, Decreased range of motion, Difficulty walking, Obesity, Decreased endurance, Decreased mobility, Decreased strength, Improper body mechanics, Decreased activity tolerance, Pain  Visit Diagnosis: Chronic pain of left  knee  Difficulty in walking, not elsewhere classified  Chronic pain of right knee  Muscle weakness (generalized)     Problem List There are no active problems to display for this patient.   Carney Living PT DPT  06/09/2019, 3:32 PM  [facility] [phone]  [place], [place], [address] Phone: [phone]   Fax:  [phone]  Name: Virginia Luna MRN: [id number] Date of Birth: [birth year]

## 2019-06-10 DIAGNOSIS — J3089 Other allergic rhinitis: Secondary | ICD-10-CM | POA: Diagnosis not present

## 2019-06-10 DIAGNOSIS — J3081 Allergic rhinitis due to animal (cat) (dog) hair and dander: Secondary | ICD-10-CM | POA: Diagnosis not present

## 2019-06-10 DIAGNOSIS — J301 Allergic rhinitis due to pollen: Secondary | ICD-10-CM | POA: Diagnosis not present

## 2019-06-11 ENCOUNTER — Ambulatory Visit: Payer: PPO | Admitting: Physical Therapy

## 2019-06-17 DIAGNOSIS — J301 Allergic rhinitis due to pollen: Secondary | ICD-10-CM | POA: Diagnosis not present

## 2019-06-17 DIAGNOSIS — J3089 Other allergic rhinitis: Secondary | ICD-10-CM | POA: Diagnosis not present

## 2019-06-17 DIAGNOSIS — J3081 Allergic rhinitis due to animal (cat) (dog) hair and dander: Secondary | ICD-10-CM | POA: Diagnosis not present

## 2019-06-19 DIAGNOSIS — E119 Type 2 diabetes mellitus without complications: Secondary | ICD-10-CM | POA: Diagnosis not present

## 2019-06-19 DIAGNOSIS — E7800 Pure hypercholesterolemia, unspecified: Secondary | ICD-10-CM | POA: Diagnosis not present

## 2019-06-19 DIAGNOSIS — J4520 Mild intermittent asthma, uncomplicated: Secondary | ICD-10-CM | POA: Diagnosis not present

## 2019-06-19 DIAGNOSIS — I10 Essential (primary) hypertension: Secondary | ICD-10-CM | POA: Diagnosis not present

## 2019-06-19 DIAGNOSIS — D649 Anemia, unspecified: Secondary | ICD-10-CM | POA: Diagnosis not present

## 2019-06-22 ENCOUNTER — Ambulatory Visit: Payer: PPO | Attending: Family Medicine | Admitting: Physical Therapy

## 2019-06-22 ENCOUNTER — Encounter: Payer: Self-pay | Admitting: Physical Therapy

## 2019-06-22 ENCOUNTER — Other Ambulatory Visit: Payer: Self-pay

## 2019-06-22 DIAGNOSIS — R262 Difficulty in walking, not elsewhere classified: Secondary | ICD-10-CM | POA: Insufficient documentation

## 2019-06-22 DIAGNOSIS — M25562 Pain in left knee: Secondary | ICD-10-CM | POA: Insufficient documentation

## 2019-06-22 DIAGNOSIS — M25561 Pain in right knee: Secondary | ICD-10-CM | POA: Insufficient documentation

## 2019-06-22 DIAGNOSIS — E7800 Pure hypercholesterolemia, unspecified: Secondary | ICD-10-CM | POA: Diagnosis not present

## 2019-06-22 DIAGNOSIS — D649 Anemia, unspecified: Secondary | ICD-10-CM | POA: Diagnosis not present

## 2019-06-22 DIAGNOSIS — Z7984 Long term (current) use of oral hypoglycemic drugs: Secondary | ICD-10-CM | POA: Diagnosis not present

## 2019-06-22 DIAGNOSIS — M6281 Muscle weakness (generalized): Secondary | ICD-10-CM | POA: Insufficient documentation

## 2019-06-22 DIAGNOSIS — E119 Type 2 diabetes mellitus without complications: Secondary | ICD-10-CM | POA: Diagnosis not present

## 2019-06-22 DIAGNOSIS — Z1159 Encounter for screening for other viral diseases: Secondary | ICD-10-CM | POA: Diagnosis not present

## 2019-06-22 DIAGNOSIS — G8929 Other chronic pain: Secondary | ICD-10-CM

## 2019-06-22 DIAGNOSIS — I10 Essential (primary) hypertension: Secondary | ICD-10-CM | POA: Diagnosis not present

## 2019-06-23 ENCOUNTER — Encounter: Payer: Self-pay | Admitting: Physical Therapy

## 2019-06-23 NOTE — Therapy (Signed)
[place], [place], [address] Phone: [phone]   Fax:  [phone]  Physical Therapy Treatment  Patient Details  Name: Virginia Luna MRN: [id number] Date of Birth: May 02, 1961 Referring Provider (PT): Dr Eunice Blase    Encounter Date: 06/22/2019  PT End of Session - 06/22/19 1533    Visit Number  22    Number of Visits  25    Date for PT Re-Evaluation  07/07/19    Authorization Type  third party payer    PT Start Time  1500    PT Stop Time  1544    PT Time Calculation (min)  44 min    Activity Tolerance  Patient tolerated treatment well    Behavior During Therapy  [facility] for tasks assessed/performed       Past Medical History:  Diagnosis Date  . Arthritis   . Asthma   . Chronic pain   . Diabetes mellitus without complication ([place])   . Fibromyalgia   . Sleep apnea    did use cpap-lost 25lb-says she does not need it  . Wears contact lenses     Past Surgical History:  Procedure Laterality Date  . BREAST BIOPSY    . BREAST EXCISIONAL BIOPSY    . BREAST LUMPECTOMY WITH RADIOACTIVE SEED LOCALIZATION Bilateral 11/24/2014   Procedure: BILATERAL BREAST LUMPECTOMY WITH RADIOACTIVE SEED LOCALIZATION;  Surgeon: [name], MD;  Location: [place];  Service: General;  Laterality: Bilateral;  . CHOLECYSTECTOMY    . COLONOSCOPY    . DILATION AND CURETTAGE OF UTERUS    . ESOPHAGOGASTRODUODENOSCOPY (EGD) WITH PROPOFOL N/A 07/23/2016   Procedure: ESOPHAGOGASTRODUODENOSCOPY (EGD) WITH PROPOFOL;  Surgeon: Laurence Spates, MD;  [facility];  Service: Endoscopy;  Laterality: N/A;  . ESOPHAGOGASTRODUODENOSCOPY (EGD) WITH PROPOFOL N/A 06/11/2018   Procedure: ESOPHAGOGASTRODUODENOSCOPY (EGD) WITH PROPOFOL;  Surgeon: Laurence Spates, MD;  Location[facility];  Service: Endoscopy;  Laterality: N/A;  . LUMBAR LAMINECTOMY  2010  . UMBILICAL HERNIA REPAIR     age 58  . UPPER GI ENDOSCOPY       There were no vitals filed for this visit.  Subjective Assessment - 06/22/19 1510    Subjective  Patient has not been seen by therapy for 2 weeks 2nd to therapist being out. In that time she has been able to continue with her exercises. She has some pain today but it is not too bad. She has increased pain on rainy days.    Limitations  Standing    How long can you sit comfortably?  Sitting for too long stiffens her knees    How long can you stand comfortably?  varies how long she can stand    How long can you walk comfortably?  trying to walk 5 min 3 times a day    Diagnostic tests  X-ray but results arent in the computer; Per patient bone spurs and arthritis.    Currently in Pain?  Yes    Pain Score  4     Pain Location  Knee    Pain Orientation  Right;Left    Pain Descriptors / Indicators  Aching    Pain Type  Chronic pain    Pain Onset  More than a month ago    Pain Frequency  Constant    Aggravating Factors   standing and walking    Pain Relieving Factors  rest    Effect of Pain on Daily Activities  difficulty walking  Multiple Pain Sites  No   Pain on the right equals the left                      [facility] Adult PT Treatment/Exercise - 06/23/19 0001      Knee/Hip Exercises: Stretches   Active Hamstring Stretch Limitations  seated hamstring stretch 3x20 sec hold       Knee/Hip Exercises: Standing   Heel Raises Limitations  x20     Hip Flexion Limitations  x20     Lateral Step Up  1 set;10 reps;Both;Hand Hold: 2;Step Height: 6"    Forward Step Up  10 reps;1 set;Hand Hold: 2;Both;Step Height: 6"    Functional Squat Limitations  1/4 squat x20     Other Standing Knee Exercises  narrow base on air-ex 3x30 sec eyes open and closed each       Manual Therapy   Manual therapy comments  patella mobs     Joint Mobilization  PA and AP glides Grade II ands III with distraction     Soft tissue mobilization  to left anterior knee     Passive ROM  into knee  extension              PT Education - 06/22/19 1532    Education Details  technique with standing ther-ex.    Person(s) Educated  Patient    Methods  Explanation;Demonstration;Tactile cues;Verbal cues    Comprehension  Returned demonstration;Verbal cues required;Tactile cues required;Verbalized understanding       PT Short Term Goals - 06/09/19 1523      PT SHORT TERM GOAL #1   Title  Patient will be indepenent with basic HEP    Baseline  working on basic HEP    Time  3    Period  Weeks    Status  Achieved      PT SHORT TERM GOAL #2   Title  Patient will demonstrate a 50% improvement in patella movement    Baseline  signifjcant improvement bilateral    Time  3    Period  Weeks    Status  On-going    Target Date  04/10/19      PT SHORT TERM GOAL #3   Baseline  imroving    Time  3    Period  Weeks    Status  On-going    Target Date  04/10/19        PT Long Term Goals - 05/29/19 0803      PT LONG TERM GOAL #1   Title  Patient will transfer sit to stand independently without use of hands and without increased pain    Time  6    Period  Weeks    Status  On-going      PT LONG TERM GOAL #2   Title  Patient will stand for 10 minutes without increased pain in order to perfrom ADL's    Time  6    Period  Weeks    Status  On-going      PT LONG TERM GOAL #3   Title  Patient will ambualte 150' without increased pain with LRAD in order to improve ability to go to appointments    Time  6    Period  Weeks    Status  On-going            Plan - 06/22/19 1533    Clinical Impression Statement  Patient did well despite break from  therapy. Sh econtinues to tolerate ther-ex well. Therapy will continue to advanace her towards a home program.    Comorbidities  fibromaylgia, anxiety, obesity    Examination-Activity Limitations  Transfers;Locomotion Level;Sit;Sleep;Squat;Stairs;Stand    Examination-Participation Restrictions  Community Activity;Cleaning;Laundry     Stability/Clinical Decision Making  Stable/Uncomplicated    Clinical Decision Making  Moderate    Rehab Potential  Good    PT Frequency  1x / week    PT Duration  4 weeks    PT Treatment/Interventions  ADLs/Self Care Home Management;Cryotherapy;Electrical Stimulation;Moist Heat;Traction;Ultrasound;DME Instruction;Stair training;Functional mobility training;Therapeutic activities;Therapeutic exercise;Gait training;Neuromuscular re-education;Patient/family education;Passive range of motion;Manual techniques;Taping;Spinal Manipulations;Joint Manipulations    PT Next Visit Plan  continue to advance functional strengthening as tolerated. Continue standing exercises. Ween off modalities. Continue with knee extension strengthening.    PT Home Exercise Plan  quad set, seated clam with band, self patella mobs, supine heel slides, bridge, SAQ, LAQ sink squat, mini lunge forward, side and backward with UE support. hip hinge and deadlift on raised step    Consulted and Agree with Plan of Care  Patient       Patient will benefit from skilled therapeutic intervention in order to improve the following deficits and impairments:  Abnormal gait, Decreased range of motion, Difficulty walking, Obesity, Decreased endurance, Decreased mobility, Decreased strength, Improper body mechanics, Decreased activity tolerance, Pain  Visit Diagnosis: Chronic pain of left knee  Difficulty in walking, not elsewhere classified  Chronic pain of right knee  Muscle weakness (generalized)     Problem List There are no active problems to display for this patient.   Carney Living PT DPT  06/23/2019, 9:52 AM  [facility] [address] [place], [place], [address] Phone: [phone]   Fax:  [phone]  Name: Virginia Luna MRN: [id number] Date of Birth: 02-Sep-1960

## 2019-06-24 DIAGNOSIS — J3089 Other allergic rhinitis: Secondary | ICD-10-CM | POA: Diagnosis not present

## 2019-06-24 DIAGNOSIS — J3081 Allergic rhinitis due to animal (cat) (dog) hair and dander: Secondary | ICD-10-CM | POA: Diagnosis not present

## 2019-06-24 DIAGNOSIS — J301 Allergic rhinitis due to pollen: Secondary | ICD-10-CM | POA: Diagnosis not present

## 2019-06-25 DIAGNOSIS — J4520 Mild intermittent asthma, uncomplicated: Secondary | ICD-10-CM | POA: Diagnosis not present

## 2019-06-25 DIAGNOSIS — I10 Essential (primary) hypertension: Secondary | ICD-10-CM | POA: Diagnosis not present

## 2019-06-25 DIAGNOSIS — D649 Anemia, unspecified: Secondary | ICD-10-CM | POA: Diagnosis not present

## 2019-06-25 DIAGNOSIS — Z7984 Long term (current) use of oral hypoglycemic drugs: Secondary | ICD-10-CM | POA: Diagnosis not present

## 2019-06-25 DIAGNOSIS — E7800 Pure hypercholesterolemia, unspecified: Secondary | ICD-10-CM | POA: Diagnosis not present

## 2019-06-25 DIAGNOSIS — E119 Type 2 diabetes mellitus without complications: Secondary | ICD-10-CM | POA: Diagnosis not present

## 2019-06-30 ENCOUNTER — Ambulatory Visit: Payer: PPO | Admitting: Physical Therapy

## 2019-06-30 ENCOUNTER — Encounter: Payer: Self-pay | Admitting: Physical Therapy

## 2019-06-30 ENCOUNTER — Other Ambulatory Visit: Payer: Self-pay

## 2019-06-30 DIAGNOSIS — M25562 Pain in left knee: Secondary | ICD-10-CM | POA: Diagnosis not present

## 2019-06-30 DIAGNOSIS — M6281 Muscle weakness (generalized): Secondary | ICD-10-CM

## 2019-06-30 DIAGNOSIS — G8929 Other chronic pain: Secondary | ICD-10-CM

## 2019-06-30 DIAGNOSIS — R262 Difficulty in walking, not elsewhere classified: Secondary | ICD-10-CM

## 2019-07-01 DIAGNOSIS — J301 Allergic rhinitis due to pollen: Secondary | ICD-10-CM | POA: Diagnosis not present

## 2019-07-01 DIAGNOSIS — J3089 Other allergic rhinitis: Secondary | ICD-10-CM | POA: Diagnosis not present

## 2019-07-01 DIAGNOSIS — J3081 Allergic rhinitis due to animal (cat) (dog) hair and dander: Secondary | ICD-10-CM | POA: Diagnosis not present

## 2019-07-01 NOTE — Therapy (Signed)
[place], [place], [address] Phone: [phone]   Fax:  [phone]  Physical Therapy Treatment  Patient Details  Name: Virginia Luna MRN: [id number] Date of Birth: 04-13-1961 Referring Provider (PT): Dr Eunice Blase    Encounter Date: 06/30/2019  PT End of Session - 06/30/19 1357    Visit Number  23    Number of Visits  25    Date for PT Re-Evaluation  07/07/19    Authorization Type  third party payer    PT Start Time  1330    PT Stop Time  1412    PT Time Calculation (min)  42 min    Activity Tolerance  Patient tolerated treatment well    Behavior During Therapy  [facility] for tasks assessed/performed       Past Medical History:  Diagnosis Date  . Arthritis   . Asthma   . Chronic pain   . Diabetes mellitus without complication ([place])   . Fibromyalgia   . Sleep apnea    did use cpap-lost 25lb-says she does not need it  . Wears contact lenses     Past Surgical History:  Procedure Laterality Date  . BREAST BIOPSY    . BREAST EXCISIONAL BIOPSY    . BREAST LUMPECTOMY WITH RADIOACTIVE SEED LOCALIZATION Bilateral 11/24/2014   Procedure: BILATERAL BREAST LUMPECTOMY WITH RADIOACTIVE SEED LOCALIZATION;  Surgeon: [name], MD;  Location: [place];  Service: General;  Laterality: Bilateral;  . CHOLECYSTECTOMY    . COLONOSCOPY    . DILATION AND CURETTAGE OF UTERUS    . ESOPHAGOGASTRODUODENOSCOPY (EGD) WITH PROPOFOL N/A 07/23/2016   Procedure: ESOPHAGOGASTRODUODENOSCOPY (EGD) WITH PROPOFOL;  Surgeon: Laurence Spates, MD;  [facility];  Service: Endoscopy;  Laterality: N/A;  . ESOPHAGOGASTRODUODENOSCOPY (EGD) WITH PROPOFOL N/A 06/11/2018   Procedure: ESOPHAGOGASTRODUODENOSCOPY (EGD) WITH PROPOFOL;  Surgeon: Laurence Spates, MD;  [facility];  Service: Endoscopy;  Laterality: N/A;  . LUMBAR LAMINECTOMY  2010  . UMBILICAL HERNIA REPAIR     age 58  . UPPER GI ENDOSCOPY       There were no vitals filed for this visit.  Subjective Assessment - 06/30/19 1341    Subjective  Patient reports the knees were good over the weekend. They are just a little sore today. She has been working on her exercises.    Limitations  Standing    How long can you sit comfortably?  Sitting for too long stiffens her knees    How long can you stand comfortably?  varies how long she can stand    Diagnostic tests  X-ray but results arent in the computer; Per patient bone spurs and arthritis.    Currently in Pain?  Yes   both knees per patient   Pain Score  4     Pain Location  Knee    Pain Orientation  Right;Left    Pain Descriptors / Indicators  Aching    Pain Type  Acute pain    Pain Onset  More than a month ago    Pain Frequency  Constant    Aggravating Factors   standing and walking    Pain Relieving Factors  rest    Effect of Pain on Daily Activities  difficulty walking                       [facility] Adult PT Treatment/Exercise - 07/01/19 0001      Knee/Hip Exercises:  Stretches   Active Hamstring Stretch Limitations  seated hamstring stretch 3x20 sec hold       Knee/Hip Exercises: Standing   Heel Raises Limitations  x20     Hip Flexion Limitations  x20     Lateral Step Up  1 set;10 reps;Both;Hand Hold: 2;Step Height: 6"    Forward Step Up  10 reps;1 set;Hand Hold: 2;Both;Step Height: 6"    Functional Squat Limitations  1/4 squat x20       Knee/Hip Exercises: Seated   Long Arc Quad Limitations  2x15 90-45 on theleft       Knee/Hip Exercises: Supine   Quad Sets Limitations  2x10     Short Arc Quad Sets Limitations  2x10 2lb on the right     Straight Leg Raises  10 reps;Both    Straight Leg Raises Limitations  2lb on the right       Manual Therapy   Manual therapy comments  patella mobs     Joint Mobilization  PA and AP glides Grade II ands III with distraction     Soft tissue mobilization  to left anterior knee     Passive ROM  into knee  extension              PT Education - 06/30/19 1354    Education Details  reviewed technique with ther-ex -ex    Person(s) Educated  Patient    Methods  Explanation;Demonstration;Tactile cues;Verbal cues    Comprehension  Returned demonstration;Verbal cues required;Tactile cues required;Verbalized understanding       PT Short Term Goals - 06/30/19 1407      PT SHORT TERM GOAL #1   Title  Patient will be indepenent with basic HEP    Baseline  working on basic HEP    Time  3    Period  Weeks    Status  Achieved    Target Date  04/10/19      PT SHORT TERM GOAL #2   Title  Patient will demonstrate a 50% improvement in patella movement    Baseline  signifjcant improvement bilateral    Time  3    Period  Weeks    Status  On-going    Target Date  04/10/19      PT SHORT TERM GOAL #3   Title  Patient will extend bilateral LE without increased pain    Baseline  minor pain with edn rnage extension    Time  3    Period  Weeks    Status  Achieved    Target Date  04/10/19        PT Long Term Goals - 05/29/19 0803      PT LONG TERM GOAL #1   Title  Patient will transfer sit to stand independently without use of hands and without increased pain    Time  6    Period  Weeks    Status  On-going      PT LONG TERM GOAL #2   Title  Patient will stand for 10 minutes without increased pain in order to perfrom ADL's    Time  6    Period  Weeks    Status  On-going      PT LONG TERM GOAL #3   Title  Patient will ambualte 150' without increased pain with LRAD in order to improve ability to go to appointments    Time  6    Period  Weeks    Status  On-going            Plan - 06/30/19 1358    Clinical Impression Statement  Patient continues to make progress. She tolerated ther-ex well. therapy advanaced her weight with LAQ, SAQ, and nu-step. She had no increase in pain. her left knee is nealry full extension with minor pain at end range. She was encouraged to continue with  extension stretching at home. Therapy will xontinue to advance her home exercises over the next few visits.    Personal Factors and Comorbidities  Comorbidity 1;Comorbidity 2;Comorbidity 3+    Comorbidities  fibromaylgia, anxiety, obesity    Examination-Activity Limitations  Transfers;Locomotion Level;Sit;Sleep;Squat;Stairs;Stand    Examination-Participation Restrictions  Community Activity;Cleaning;Laundry    Stability/Clinical Decision Making  Stable/Uncomplicated    Clinical Decision Making  Moderate    Rehab Potential  Good    PT Frequency  1x / week    PT Treatment/Interventions  ADLs/Self Care Home Management;Cryotherapy;Electrical Stimulation;Moist Heat;Traction;Ultrasound;DME Instruction;Stair training;Functional mobility training;Therapeutic activities;Therapeutic exercise;Gait training;Neuromuscular re-education;Patient/family education;Passive range of motion;Manual techniques;Taping;Spinal Manipulations;Joint Manipulations    PT Next Visit Plan  continue to advance functional strengthening as tolerated. Continue standing exercises. Ween off modalities. Continue with knee extension strengthening.    PT Home Exercise Plan  quad set, seated clam with band, self patella mobs, supine heel slides, bridge, SAQ, LAQ sink squat, mini lunge forward, side and backward with UE support. hip hinge and deadlift on raised step       Patient will benefit from skilled therapeutic intervention in order to improve the following deficits and impairments:  Abnormal gait, Decreased range of motion, Difficulty walking, Obesity, Decreased endurance, Decreased mobility, Decreased strength, Improper body mechanics, Decreased activity tolerance, Pain  Visit Diagnosis: Chronic pain of left knee  Difficulty in walking, not elsewhere classified  Chronic pain of right knee  Muscle weakness (generalized)     Problem List There are no active problems to display for this patient.   Carney Living PT DPT   07/01/2019, 9:16 AM  [facility] [address] [place], [place], [address] Phone: [phone]   Fax:  [phone]  Name: EBONYE DEFUSCO MRN: [id number] Date of Birth: [birth year]

## 2019-07-07 ENCOUNTER — Ambulatory Visit: Payer: PPO | Admitting: Physical Therapy

## 2019-07-07 ENCOUNTER — Encounter: Payer: Self-pay | Admitting: Physical Therapy

## 2019-07-07 ENCOUNTER — Other Ambulatory Visit: Payer: Self-pay

## 2019-07-07 DIAGNOSIS — G8929 Other chronic pain: Secondary | ICD-10-CM

## 2019-07-07 DIAGNOSIS — M25562 Pain in left knee: Secondary | ICD-10-CM | POA: Diagnosis not present

## 2019-07-07 DIAGNOSIS — R262 Difficulty in walking, not elsewhere classified: Secondary | ICD-10-CM

## 2019-07-07 DIAGNOSIS — M6281 Muscle weakness (generalized): Secondary | ICD-10-CM

## 2019-07-07 NOTE — Therapy (Signed)
[place], [place], [address] Phone: [phone]   Fax:  [phone]  Physical Therapy Treatment  Patient Details  Name: Virginia Luna MRN: [id number] Date of Birth: Feb 28, 1961 Referring Provider (PT): Dr Eunice Blase    Encounter Date: 07/07/2019  PT End of Session - 07/07/19 1600    Visit Number  24    Number of Visits  25    Date for PT Re-Evaluation  07/07/19    Authorization Type  third party payer    PT Start Time  1333    PT Stop Time  1412    PT Time Calculation (min)  39 min    Activity Tolerance  Patient tolerated treatment well    Behavior During Therapy  [facility] for tasks assessed/performed       Past Medical History:  Diagnosis Date  . Arthritis   . Asthma   . Chronic pain   . Diabetes mellitus without complication ([place])   . Fibromyalgia   . Sleep apnea    did use cpap-lost 25lb-says she does not need it  . Wears contact lenses     Past Surgical History:  Procedure Laterality Date  . BREAST BIOPSY    . BREAST EXCISIONAL BIOPSY    . BREAST LUMPECTOMY WITH RADIOACTIVE SEED LOCALIZATION Bilateral 11/24/2014   Procedure: BILATERAL BREAST LUMPECTOMY WITH RADIOACTIVE SEED LOCALIZATION;  Surgeon: [name], MD;  Location: [place];  Service: General;  Laterality: Bilateral;  . CHOLECYSTECTOMY    . COLONOSCOPY    . DILATION AND CURETTAGE OF UTERUS    . ESOPHAGOGASTRODUODENOSCOPY (EGD) WITH PROPOFOL N/A 07/23/2016   Procedure: ESOPHAGOGASTRODUODENOSCOPY (EGD) WITH PROPOFOL;  Surgeon: Laurence Spates, MD;  [facility];  Service: Endoscopy;  Laterality: N/A;  . ESOPHAGOGASTRODUODENOSCOPY (EGD) WITH PROPOFOL N/A 06/11/2018   Procedure: ESOPHAGOGASTRODUODENOSCOPY (EGD) WITH PROPOFOL;  Surgeon: Laurence Spates, MD;  [facility];  Service: Endoscopy;  Laterality: N/A;  . LUMBAR LAMINECTOMY  2010  . UMBILICAL HERNIA REPAIR     age 58  . UPPER GI ENDOSCOPY       There were no vitals filed for this visit.  Subjective Assessment - 07/07/19 1436    Subjective  Patient continues to do well. She has been working on her exercises at home. She is having very little pain today because she took a pain pill. She reports she did a little jog from the parking lot.    Limitations  Standing    How long can you sit comfortably?  Sitting for too long stiffens her knees    How long can you stand comfortably?  varies how long she can stand    How long can you walk comfortably?  trying to walk 5 min 3 times a day    Diagnostic tests  X-ray but results arent in the computer; Per patient bone spurs and arthritis.    Currently in Pain?  No/denies                       [facility] Adult PT Treatment/Exercise - 07/07/19 0001      Self-Care   Other Self-Care Comments   reivewed how to contact nutrition and diabetes education service; reviewed where to but ankle weights and use of a tiger tail for self soft tissue mobvilization.       Knee/Hip Exercises: Stretches   Active Hamstring Stretch Limitations  seated hamstring stretch 3x20 sec hold  Knee/Hip Exercises: Standing   Heel Raises Limitations  x20     Hip Flexion Limitations  x20     Lateral Step Up  1 set;10 reps;Both;Hand Hold: 2;Step Height: 6"    Forward Step Up  10 reps;1 set;Hand Hold: 2;Both;Step Height: 6"    Functional Squat Limitations  1/4 squat x20       Knee/Hip Exercises: Seated   Long Arc Quad Limitations  2x15 2lb on right 1lb on left     Sit to Sand  20 reps   2x10 without UE suport     Knee/Hip Exercises: Supine   Quad Sets Limitations  2x10 5 sec hold     Short Arc Quad Sets Limitations  2x10 2lb on the left 1lb on the right       Manual Therapy   Manual therapy comments  reviewed self soft tissue mobilization with tiger tail     Joint Mobilization  PA and AP glides Grade II ands III with distraction     Soft tissue mobilization  to left anterior knee     Passive  ROM  into knee extension              PT Education - 07/07/19 1559    Education Details  HEP and symptom mangement    Person(s) Educated  Patient    Methods  Explanation;Demonstration;Tactile cues;Verbal cues    Comprehension  Verbalized understanding;Returned demonstration;Verbal cues required;Tactile cues required       PT Short Term Goals - 06/30/19 1407      PT SHORT TERM GOAL #1   Title  Patient will be indepenent with basic HEP    Baseline  working on basic HEP    Time  3    Period  Weeks    Status  Achieved    Target Date  04/10/19      PT SHORT TERM GOAL #2   Title  Patient will demonstrate a 50% improvement in patella movement    Baseline  signifjcant improvement bilateral    Time  3    Period  Weeks    Status  On-going    Target Date  04/10/19      PT SHORT TERM GOAL #3   Title  Patient will extend bilateral LE without increased pain    Baseline  minor pain with edn rnage extension    Time  3    Period  Weeks    Status  Achieved    Target Date  04/10/19        PT Long Term Goals - 05/29/19 0803      PT LONG TERM GOAL #1   Title  Patient will transfer sit to stand independently without use of hands and without increased pain    Time  6    Period  Weeks    Status  On-going      PT LONG TERM GOAL #2   Title  Patient will stand for 10 minutes without increased pain in order to perfrom ADL's    Time  6    Period  Weeks    Status  On-going      PT LONG TERM GOAL #3   Title  Patient will ambualte 150' without increased pain with LRAD in order to improve ability to go to appointments    Time  6    Period  Weeks    Status  On-going  Plan - 07/07/19 1600    Clinical Impression Statement  Therapy focused more on fnctional strengthening. She did very well with squats and sit to stand transfers. Patient also expressed interest in diabetes managmenet and nutrition education. Therapy provided patient with information on both.     Personal Factors and Comorbidities  Comorbidity 1;Comorbidity 2;Comorbidity 3+    Comorbidities  fibromaylgia, anxiety, obesity    Examination-Activity Limitations  Transfers;Locomotion Level;Sit;Sleep;Squat;Stairs;Stand    Stability/Clinical Decision Making  Stable/Uncomplicated    Clinical Decision Making  Moderate    Rehab Potential  Good    PT Frequency  1x / week    PT Duration  4 weeks    PT Treatment/Interventions  ADLs/Self Care Home Management;Cryotherapy;Electrical Stimulation;Moist Heat;Traction;Ultrasound;DME Instruction;Stair training;Functional mobility training;Therapeutic activities;Therapeutic exercise;Gait training;Neuromuscular re-education;Patient/family education;Passive range of motion;Manual techniques;Taping;Spinal Manipulations;Joint Manipulations    PT Next Visit Plan  continue to advance functional strengthening as tolerated. Continue standing exercises. Ween off modalities. Continue with knee extension strengthening.    PT Home Exercise Plan  quad set, seated clam with band, self patella mobs, supine heel slides, bridge, SAQ, LAQ sink squat, mini lunge forward, side and backward with UE support. hip hinge and deadlift on raised step    Consulted and Agree with Plan of Care  Patient       Patient will benefit from skilled therapeutic intervention in order to improve the following deficits and impairments:  Abnormal gait, Decreased range of motion, Difficulty walking, Obesity, Decreased endurance, Decreased mobility, Decreased strength, Improper body mechanics, Decreased activity tolerance, Pain  Visit Diagnosis: Chronic pain of left knee  Difficulty in walking, not elsewhere classified  Chronic pain of right knee  Muscle weakness (generalized)     Problem List There are no active problems to display for this patient.   Carney Living PT DPT  07/07/2019, 4:36 PM  [facility] [address] [place], [place], [address] Phone: [phone]   Fax:  [phone]  Name: SHAKILAH LINNEBUR MRN: [id number] Date of Birth: [birth year]

## 2019-07-08 DIAGNOSIS — J3089 Other allergic rhinitis: Secondary | ICD-10-CM | POA: Diagnosis not present

## 2019-07-08 DIAGNOSIS — J301 Allergic rhinitis due to pollen: Secondary | ICD-10-CM | POA: Diagnosis not present

## 2019-07-08 DIAGNOSIS — J3081 Allergic rhinitis due to animal (cat) (dog) hair and dander: Secondary | ICD-10-CM | POA: Diagnosis not present

## 2019-07-14 ENCOUNTER — Ambulatory Visit: Payer: PPO | Admitting: Physical Therapy

## 2019-07-14 ENCOUNTER — Ambulatory Visit (INDEPENDENT_AMBULATORY_CARE_PROVIDER_SITE_OTHER): Payer: PPO | Admitting: Family Medicine

## 2019-07-14 ENCOUNTER — Other Ambulatory Visit: Payer: Self-pay

## 2019-07-14 ENCOUNTER — Encounter: Payer: Self-pay | Admitting: Family Medicine

## 2019-07-14 ENCOUNTER — Telehealth: Payer: Self-pay

## 2019-07-14 DIAGNOSIS — R262 Difficulty in walking, not elsewhere classified: Secondary | ICD-10-CM

## 2019-07-14 DIAGNOSIS — M25562 Pain in left knee: Secondary | ICD-10-CM

## 2019-07-14 DIAGNOSIS — M25561 Pain in right knee: Secondary | ICD-10-CM | POA: Diagnosis not present

## 2019-07-14 DIAGNOSIS — G8929 Other chronic pain: Secondary | ICD-10-CM

## 2019-07-14 DIAGNOSIS — M6281 Muscle weakness (generalized): Secondary | ICD-10-CM

## 2019-07-14 NOTE — Progress Notes (Signed)
Office Visit Note   Patient: Virginia Luna           Date of Birth: 1961/04/14           MRN: [id number] Visit Date: 07/14/2019 Requested by: Shirline Frees, MD [address] [place],  [place] [address] PCP: Shirline Frees, MD  Subjective: Chief Complaint  Patient presents with  . Right Knee - Pain    Followup bilateral knee pain. The knees are "better than they were, " with PT. Next week is her last session.   . Left Knee - Pain    HPI: She is roughly 57 months status post fall resulting in bilateral knee pain with underlying DJD on x-rays.  She has been through more than 20 sessions of physical therapy and has 2 more to go.  Overall she feels stronger and has better range of motion, but her pain is not going away.  She needs her medication on a daily basis.  Her knees give way without warning, and they ache and throb much of the time.  Both knees hurt about equally, and the pain is on the anteromedial aspect.              ROS: No fevers or chills.  All other systems were reviewed and are negative.  Objective: Vital Signs: There were no vitals taken for this visit.  Physical Exam:  General:  Alert and oriented, in no acute distress. Pulm:  Breathing unlabored. Psy:  Normal mood, congruent affect. Skin: No rash or bruising. Knees: She has 2+ patellofemoral crepitus, full active extension and flexion of about 130 degrees.  No detectable effusion.  Tender on the medial joint line in both knees with no palpable click on McMurray's.  Imaging: None today.  Assessment & Plan: 1.  Chronic bilateral knee pain 57-month status post fall, suspect mainly due to osteoarthritis.  Cannot rule out degenerative meniscus tears. -Discussed options with her, she wants to finish physical therapy and try gel injections in her knees.  If this fails to give her relief, then we will order MRI scan.     Procedures: No procedures performed  No notes on file     PMFS History:  There are no active problems to display for this patient.  Past Medical History:  Diagnosis Date  . Arthritis   . Asthma   . Chronic pain   . Diabetes mellitus without complication ([place])   . Fibromyalgia   . Sleep apnea    did use cpap-lost 25lb-says she does not need it  . Wears contact lenses     Family History  Problem Relation Age of Onset  . Breast cancer Paternal Aunt   . Breast cancer Paternal Grandmother     Past Surgical History:  Procedure Laterality Date  . BREAST BIOPSY    . BREAST EXCISIONAL BIOPSY    . BREAST LUMPECTOMY WITH RADIOACTIVE SEED LOCALIZATION Bilateral 11/24/2014   Procedure: BILATERAL BREAST LUMPECTOMY WITH RADIOACTIVE SEED LOCALIZATION;  Surgeon: [name], MD;  Location: [place];  Service: General;  Laterality: Bilateral;  . CHOLECYSTECTOMY    . COLONOSCOPY    . DILATION AND CURETTAGE OF UTERUS    . ESOPHAGOGASTRODUODENOSCOPY (EGD) WITH PROPOFOL N/A 07/23/2016   Procedure: ESOPHAGOGASTRODUODENOSCOPY (EGD) WITH PROPOFOL;  Surgeon: Laurence Spates, MD;  [facility];  Service: Endoscopy;  Laterality: N/A;  . ESOPHAGOGASTRODUODENOSCOPY (EGD) WITH PROPOFOL N/A 06/11/2018   Procedure: ESOPHAGOGASTRODUODENOSCOPY (EGD) WITH PROPOFOL;  Surgeon: Laurence Spates, MD;  [facility];  Service: Endoscopy;  Laterality: N/A;  . LUMBAR LAMINECTOMY  2010  . UMBILICAL HERNIA REPAIR     age 21  . UPPER GI ENDOSCOPY     Social History   Occupational History  . Not on file  Tobacco Use  . Smoking status: Never Smoker  . Smokeless tobacco: Never Used  Substance and Sexual Activity  . Alcohol use: No  . Drug use: No  . Sexual activity: Not on file

## 2019-07-14 NOTE — Telephone Encounter (Signed)
-----   Message from Eunice Blase, MD sent at 07/14/2019  9:54 AM EST ----- Regarding: Bilateral knee gel injections Please request approval.

## 2019-07-14 NOTE — Therapy (Signed)
[place], [place], [address] Phone: [phone]   Fax:  [phone]  Physical Therapy Treatment  Patient Details  Name: Virginia Luna MRN: [id number] Date of Birth: 28-Sep-1960 Referring Provider (PT): Dr Virginia Luna    Encounter Date: 07/14/2019  PT End of Session - 07/14/19 1400    Visit Number  25    Number of Visits  25    Date for PT Re-Evaluation  07/07/19    Authorization Type  third party payer    PT Start Time  1342   therapist lost track of time   PT Stop Time  1415    PT Time Calculation (min)  33 min    Activity Tolerance  Patient tolerated treatment well    Behavior During Therapy  [facility] for tasks assessed/performed       Past Medical History:  Diagnosis Date  . Arthritis   . Asthma   . Chronic pain   . Diabetes mellitus without complication ([place])   . Fibromyalgia   . Sleep apnea    did use cpap-lost 25lb-says she does not need it  . Wears contact lenses     Past Surgical History:  Procedure Laterality Date  . BREAST BIOPSY    . BREAST EXCISIONAL BIOPSY    . BREAST LUMPECTOMY WITH RADIOACTIVE SEED LOCALIZATION Bilateral 11/24/2014   Procedure: BILATERAL BREAST LUMPECTOMY WITH RADIOACTIVE SEED LOCALIZATION;  Surgeon: Virginia Luna, MD;  Location: [facility];  Service: General;  Laterality: Bilateral;  . CHOLECYSTECTOMY    . COLONOSCOPY    . DILATION AND CURETTAGE OF UTERUS    . ESOPHAGOGASTRODUODENOSCOPY (EGD) WITH PROPOFOL N/A 07/23/2016   Procedure: ESOPHAGOGASTRODUODENOSCOPY (EGD) WITH PROPOFOL;  Surgeon: Virginia Spates, MD;  [facility];  Service: Endoscopy;  Laterality: N/A;  . ESOPHAGOGASTRODUODENOSCOPY (EGD) WITH PROPOFOL N/A 06/11/2018   Procedure: ESOPHAGOGASTRODUODENOSCOPY (EGD) WITH PROPOFOL;  Surgeon: Virginia Spates, MD;  [facility];  Service: Endoscopy;  Laterality: N/A;  . LUMBAR LAMINECTOMY  2010  . UMBILICAL HERNIA REPAIR      age 58  . UPPER GI ENDOSCOPY      There were no vitals filed for this visit.                    [place] [facility] Adult PT Treatment/Exercise - 07/14/19 0001      Knee/Hip Exercises: Stretches   Active Hamstring Stretch Limitations  seated hamstring stretch 3x20 sec hold       Knee/Hip Exercises: Standing   Heel Raises Limitations  x20     Hip Flexion Limitations  x20     Lateral Step Up  1 set;10 reps;Both;Hand Hold: 2;Step Height: 6"    Forward Step Up  10 reps;1 set;Hand Hold: 2;Both;Step Height: 6"      Knee/Hip Exercises: Supine   Quad Sets Limitations  2x10 5 sec hold     Short Arc Quad Sets Limitations  2x10 2lb       Manual Therapy   Manual therapy comments  reviewed self soft tissue mobilization with tiger tail     Joint Mobilization  PA and AP glides Grade II ands III with distraction     Soft tissue mobilization  to left anterior knee     Passive ROM  into knee extension                PT Short Term Goals - 07/14/19 1408      PT SHORT TERM  GOAL #1   Title  Patient will be indepenent with basic HEP    Baseline  working on basic HEP    Time  3    Period  Weeks    Status  Achieved    Target Date  04/10/19      PT SHORT TERM GOAL #2   Title  Patient will demonstrate a 50% improvement in patella movement    Baseline  full movement bilateral    Time  3    Period  Weeks    Status  Achieved    Target Date  04/10/19      PT SHORT TERM GOAL #3   Title  Patient will extend bilateral LE without increased pain    Baseline  minor pain with edn rnage extension    Time  3    Period  Weeks    Status  On-going    Target Date  04/10/19        PT Long Term Goals - 07/14/19 1409      PT LONG TERM GOAL #1   Title  Patient will transfer sit to stand independently without use of hands and without increased pain    Baseline  able to transfer without hands    Time  6    Period  Weeks    Status  Achieved      PT LONG TERM GOAL #2   Title  Patient  will stand for 10 minutes without increased pain in order to perfrom ADL's    Baseline  can stand with minor pain    Time  6    Period  Weeks    Status  On-going      PT LONG TERM GOAL #3   Title  Patient will ambualte 150' without increased pain with LRAD in order to improve ability to go to appointments    Baseline  improving    Period  Weeks    Status  On-going            Plan - 07/14/19 1402    Clinical Impression Statement  The patient has made good progress. She has been able to complete her exercises much better. She has good patella movement bilateral. She is nearing full extension on the left but satill has some pain with end range extension. She is completing standing exercises. She has improved ability to do stairs and walk in the community. She does continue to have pain that reaches about a 5/10 depending on activity. She will have injections with potentially a follow up MRI. PT will fiisnish last two visits then hold per MD reccomendation.    Comorbidities  fibromaylgia, anxiety, obesity    Examination-Participation Restrictions  Community Activity;Cleaning;Laundry    Stability/Clinical Decision Making  Stable/Uncomplicated    Clinical Decision Making  Moderate    Rehab Potential  Good    PT Frequency  1x / week    PT Duration  4 weeks    PT Treatment/Interventions  ADLs/Self Care Home Management;Cryotherapy;Electrical Stimulation;Moist Heat;Traction;Ultrasound;DME Instruction;Stair training;Functional mobility training;Therapeutic activities;Therapeutic exercise;Gait training;Neuromuscular re-education;Patient/family education;Passive range of motion;Manual techniques;Taping;Spinal Manipulations;Joint Manipulations    PT Next Visit Plan  continue to advance functional strengthening as tolerated. Continue standing exercises. Ween off modalities. Continue with knee extension strengthening.    PT Home Exercise Plan  quad set, seated clam with band, self patella mobs, supine  heel slides, bridge, SAQ, LAQ sink squat, mini lunge forward, side and backward with UE support. hip hinge and deadlift  on raised step    Consulted and Agree with Plan of Care  Patient       Patient will benefit from skilled therapeutic intervention in order to improve the following deficits and impairments:  Abnormal gait, Decreased range of motion, Difficulty walking, Obesity, Decreased endurance, Decreased mobility, Decreased strength, Improper body mechanics, Decreased activity tolerance, Pain  Visit Diagnosis: Chronic pain of left knee  Difficulty in walking, not elsewhere classified  Chronic pain of right knee  Muscle weakness (generalized)     Problem List There are no active problems to display for this patient.   Carney Living  PT DPT  07/14/2019, 2:52 PM  [facility] [address] [place], [place], [address] Phone: [phone]   Fax:  [phone]  Name: AFSA BRUNKEN MRN: [id number] Date of Birth: 03-Jul-1961

## 2019-07-15 DIAGNOSIS — J3081 Allergic rhinitis due to animal (cat) (dog) hair and dander: Secondary | ICD-10-CM | POA: Diagnosis not present

## 2019-07-15 DIAGNOSIS — J3089 Other allergic rhinitis: Secondary | ICD-10-CM | POA: Diagnosis not present

## 2019-07-15 DIAGNOSIS — J301 Allergic rhinitis due to pollen: Secondary | ICD-10-CM | POA: Diagnosis not present

## 2019-07-21 ENCOUNTER — Ambulatory Visit: Payer: PPO | Attending: Family Medicine | Admitting: Physical Therapy

## 2019-07-21 ENCOUNTER — Telehealth: Payer: Self-pay | Admitting: Family Medicine

## 2019-07-21 ENCOUNTER — Encounter: Payer: Self-pay | Admitting: Physical Therapy

## 2019-07-21 ENCOUNTER — Other Ambulatory Visit: Payer: Self-pay

## 2019-07-21 DIAGNOSIS — R262 Difficulty in walking, not elsewhere classified: Secondary | ICD-10-CM | POA: Insufficient documentation

## 2019-07-21 DIAGNOSIS — M6281 Muscle weakness (generalized): Secondary | ICD-10-CM | POA: Diagnosis not present

## 2019-07-21 DIAGNOSIS — M25561 Pain in right knee: Secondary | ICD-10-CM | POA: Diagnosis not present

## 2019-07-21 DIAGNOSIS — M25562 Pain in left knee: Secondary | ICD-10-CM | POA: Diagnosis not present

## 2019-07-21 DIAGNOSIS — G8929 Other chronic pain: Secondary | ICD-10-CM

## 2019-07-21 NOTE — Therapy (Signed)
[place], [place], [address] Phone: [phone]   Fax:  [phone]  Physical Therapy Treatment/Recert   Patient Details  Name: Virginia Luna MRN: [id number] Date of Birth: 1961-06-12 Referring Provider (PT): Dr Eunice Blase    Encounter Date: 07/21/2019  PT End of Session - 07/21/19 1525    Visit Number  26    Number of Visits  30    Date for PT Re-Evaluation  08/18/19    Authorization Type  third party payer    PT Start Time  1330    PT Stop Time  1413    PT Time Calculation (min)  43 min    Activity Tolerance  Patient tolerated treatment well    Behavior During Therapy  [facility] for tasks assessed/performed       Past Medical History:  Diagnosis Date  . Arthritis   . Asthma   . Chronic pain   . Diabetes mellitus without complication ([place])   . Fibromyalgia   . Sleep apnea    did use cpap-lost 25lb-says she does not need it  . Wears contact lenses     Past Surgical History:  Procedure Laterality Date  . BREAST BIOPSY    . BREAST EXCISIONAL BIOPSY    . BREAST LUMPECTOMY WITH RADIOACTIVE SEED LOCALIZATION Bilateral 11/24/2014   Procedure: BILATERAL BREAST LUMPECTOMY WITH RADIOACTIVE SEED LOCALIZATION;  Surgeon: [name], MD;  Location: [place];  Service: General;  Laterality: Bilateral;  . CHOLECYSTECTOMY    . COLONOSCOPY    . DILATION AND CURETTAGE OF UTERUS    . ESOPHAGOGASTRODUODENOSCOPY (EGD) WITH PROPOFOL N/A 07/23/2016   Procedure: ESOPHAGOGASTRODUODENOSCOPY (EGD) WITH PROPOFOL;  Surgeon: Laurence Spates, MD;  [facility];  Service: Endoscopy;  Laterality: N/A;  . ESOPHAGOGASTRODUODENOSCOPY (EGD) WITH PROPOFOL N/A 06/11/2018   Procedure: ESOPHAGOGASTRODUODENOSCOPY (EGD) WITH PROPOFOL;  Surgeon: Laurence Spates, MD;  [facility];  Service: Endoscopy;  Laterality: N/A;  . LUMBAR LAMINECTOMY  2010  . UMBILICAL HERNIA REPAIR     age 58  . UPPER GI  ENDOSCOPY      There were no vitals filed for this visit.  Subjective Assessment - 07/21/19 1338    Subjective  Patient reports she is sore today. She is not sure why. She is having pain in both knees.    How long can you sit comfortably?  Sitting for too long stiffens her knees    How long can you stand comfortably?  varies how long she can stand    How long can you walk comfortably?  trying to walk 5 min 3 times a day    Diagnostic tests  X-ray but results arent in the computer; Per patient bone spurs and arthritis.    Currently in Pain?  Yes    Pain Score  5     Pain Location  Knee    Pain Orientation  Right;Left    Pain Descriptors / Indicators  Aching    Pain Type  Chronic pain    Pain Radiating Towards  standing and walking    Pain Onset  More than a month ago    Pain Frequency  Constant    Aggravating Factors   standing and walki ng    Pain Relieving Factors  rest    Effect of Pain on Daily Activities  difficulty walking    Multiple Pain Sites  No         [facility] PT Assessment - 07/21/19  0001      AROM   Overall AROM Comments  pain with end range left extnsion       Strength   Left Hip Flexion  4+/5    Left Hip ABduction  4+/5    Left Hip ADduction  4+/5    Right Knee Flexion  4+/5    Right Knee Extension  4+/5    Left Knee Flexion  4+/5    Left Knee Extension  4+/5      Palpation   Patella mobility  continues to demonstrate good pateall movement       Ambulation/Gait   Gait Comments  continues to have lateral movement with gait. Improved gait distance per patient/.                    [place] Adult PT Treatment/Exercise - 07/21/19 0001      Knee/Hip Exercises: Stretches   Active Hamstring Stretch  2 reps;20 seconds    Active Hamstring Stretch Limitations  seated hamstring stretch 3x20 sec hold       Knee/Hip Exercises: Seated   Other Seated Knee/Hip Exercises  seated clamshell 2x15 green       Knee/Hip Exercises: Supine   Quad Sets Limitations   2x10 5sec hold     Short Arc Quad Sets Limitations  2x10 2lb       Ultrasound   Ultrasound Location  to left lateral knee     Ultrasound Parameters  1.5 w/cm2 cont 1 mhz     Ultrasound Goals  Pain;Edema      Manual Therapy   Manual therapy comments  reviewed self soft tissue mobilization with tiger tail     Joint Mobilization  PA and AP glides Grade II ands III with distraction     Soft tissue mobilization  to left anterior knee     Passive ROM  into knee extension              PT Education - 07/21/19 1524    Education Details  RICE, pain mangement    Person(s) Educated  Patient    Methods  Explanation;Demonstration;Tactile cues;Verbal cues    Comprehension  Verbalized understanding;Returned demonstration;Verbal cues required;Tactile cues required       PT Short Term Goals - 07/21/19 1528      PT SHORT TERM GOAL #1   Title  Patient will be indepenent with basic HEP    Baseline  working on basic HEP    Time  3    Period  Weeks    Status  Achieved      PT SHORT TERM GOAL #2   Title  Patient will demonstrate a 50% improvement in patella movement    Baseline  full movement bilateral    Time  3    Period  Weeks    Status  Achieved    Target Date  04/10/19      PT SHORT TERM GOAL #3   Title  Patient will extend bilateral LE without increased pain    Baseline  minor pain with edn rnage extension    Time  3    Period  Weeks    Status  On-going    Target Date  04/10/19        PT Long Term Goals - 07/21/19 1528      PT LONG TERM GOAL #1   Title  Patient will transfer sit to stand independently without use of hands and without increased pain  Baseline  able to transfer without hands    Time  6    Period  Weeks    Status  Achieved      PT LONG TERM GOAL #2   Title  Patient will stand for 10 minutes without increased pain in order to perfrom ADL's    Baseline  can stand with minor pain    Time  6    Period  Weeks    Status  Achieved      PT LONG TERM  GOAL #3   Title  Patient will ambualte 150' without increased pain with LRAD in order to improve ability to go to appointments    Baseline  improving    Time  6    Period  Weeks    Status  On-going            Plan - 07/21/19 1525    Clinical Impression Statement  The patient wa slimited today but overall she has made good porgress. Therapy focused on manual therapy and modalities today to reduce pain. Therapy reviewed how to use her HEP over the next few weeks. she is to have an injection in her knee. She would like to come back paost injection for a follow up and D/C visit. Therapy will see patient for 1 more visit following the injections. She continues to have some pain with end range extnesion but felt better after manual therapy.    Personal Factors and Comorbidities  Comorbidity 1;Comorbidity 2;Comorbidity 3+    Comorbidities  fibromaylgia, anxiety, obesity    Examination-Activity Limitations  Transfers;Locomotion Level;Sit;Sleep;Squat;Stairs;Stand    Examination-Participation Restrictions  Community Activity;Cleaning;Laundry    Stability/Clinical Decision Making  Evolving/Moderate complexity    Clinical Decision Making  Moderate    Rehab Potential  Good    PT Frequency  1x / week    PT Duration  4 weeks    PT Treatment/Interventions  ADLs/Self Care Home Management;Cryotherapy;Electrical Stimulation;Moist Heat;Traction;Ultrasound;DME Instruction;Stair training;Functional mobility training;Therapeutic activities;Therapeutic exercise;Gait training;Neuromuscular re-education;Patient/family education;Passive range of motion;Manual techniques;Taping;Spinal Manipulations;Joint Manipulations    PT Next Visit Plan  review HEP with expected discharge    PT Home Exercise Plan  quad set, seated clam with band, self patella mobs, supine heel slides, bridge, SAQ, LAQ sink squat, mini lunge forward, side and backward with UE support. hip hinge and deadlift on raised step    Consulted and Agree  with Plan of Care  Patient       Patient will benefit from skilled therapeutic intervention in order to improve the following deficits and impairments:  Abnormal gait, Decreased range of motion, Difficulty walking, Obesity, Decreased endurance, Decreased mobility, Decreased strength, Improper body mechanics, Decreased activity tolerance, Pain  Visit Diagnosis: Chronic pain of left knee  Difficulty in walking, not elsewhere classified  Chronic pain of right knee  Muscle weakness (generalized)     Problem List There are no active problems to display for this patient.   Carney Living PT DPT  07/21/2019, 3:31 PM  [facility] [address] [place], [place], [address] Phone: [phone]   Fax:  [phone]  Name: Virginia Luna MRN: [id number] Date of Birth: 1961/03/01

## 2019-07-21 NOTE — Telephone Encounter (Signed)
Patient called asked when can she schedule an appointment  to in for the gel injection? The number to contact patient is [phone]

## 2019-07-21 NOTE — Telephone Encounter (Signed)
I called the patient and advised her I could not see that the injection has been authorized yet. I also advised her she would get a call to schedule the appointment for the injection once prior authorization has been given.  April, can you check on the progress of this?

## 2019-07-22 DIAGNOSIS — J301 Allergic rhinitis due to pollen: Secondary | ICD-10-CM | POA: Diagnosis not present

## 2019-07-22 DIAGNOSIS — J3081 Allergic rhinitis due to animal (cat) (dog) hair and dander: Secondary | ICD-10-CM | POA: Diagnosis not present

## 2019-07-22 DIAGNOSIS — J3089 Other allergic rhinitis: Secondary | ICD-10-CM | POA: Diagnosis not present

## 2019-07-23 ENCOUNTER — Telehealth: Payer: Self-pay

## 2019-07-23 NOTE — Telephone Encounter (Signed)
Noted  

## 2019-07-23 NOTE — Telephone Encounter (Signed)
Yes.  Thank you.

## 2019-07-23 NOTE — Telephone Encounter (Signed)
Submitted VOB for Monovisc, bilateral knee. 

## 2019-07-24 ENCOUNTER — Ambulatory Visit: Payer: PPO | Admitting: Physical Therapy

## 2019-07-28 ENCOUNTER — Telehealth: Payer: Self-pay

## 2019-07-28 NOTE — Telephone Encounter (Signed)
Approved for Monovisc, bilateral knee. Virginia Luna Patient will be responsible for 20% OOP. No Deductible No Co-pay No PA required  Appt. 07/31/2019 with Dr. Junius Roads

## 2019-07-29 DIAGNOSIS — J3081 Allergic rhinitis due to animal (cat) (dog) hair and dander: Secondary | ICD-10-CM | POA: Diagnosis not present

## 2019-07-29 DIAGNOSIS — J3089 Other allergic rhinitis: Secondary | ICD-10-CM | POA: Diagnosis not present

## 2019-07-29 DIAGNOSIS — J301 Allergic rhinitis due to pollen: Secondary | ICD-10-CM | POA: Diagnosis not present

## 2019-07-31 ENCOUNTER — Other Ambulatory Visit: Payer: Self-pay

## 2019-07-31 ENCOUNTER — Ambulatory Visit: Payer: Self-pay

## 2019-07-31 ENCOUNTER — Ambulatory Visit (INDEPENDENT_AMBULATORY_CARE_PROVIDER_SITE_OTHER): Payer: PPO | Admitting: Family Medicine

## 2019-07-31 ENCOUNTER — Encounter: Payer: Self-pay | Admitting: Family Medicine

## 2019-07-31 DIAGNOSIS — M25562 Pain in left knee: Secondary | ICD-10-CM

## 2019-07-31 DIAGNOSIS — M1712 Unilateral primary osteoarthritis, left knee: Secondary | ICD-10-CM | POA: Diagnosis not present

## 2019-07-31 DIAGNOSIS — M170 Bilateral primary osteoarthritis of knee: Secondary | ICD-10-CM | POA: Diagnosis not present

## 2019-07-31 DIAGNOSIS — G8929 Other chronic pain: Secondary | ICD-10-CM

## 2019-07-31 DIAGNOSIS — M1711 Unilateral primary osteoarthritis, right knee: Secondary | ICD-10-CM

## 2019-07-31 NOTE — Progress Notes (Signed)
   Office Visit Note   Patient: Virginia Luna           Date of Birth: Jun 28, 1961           MRN: [id number] Visit Date: 07/31/2019 Requested by: Shirline Frees, MD [address] [place],  [place] [address] PCP: Shirline Frees, MD  Subjective: Chief Complaint  Patient presents with  . Monovisc injection bilateral knees    HPI: She is here for bilateral Monovisc injections for knee osteoarthritis.               ROS: No fevers or chills.  All other systems were reviewed and are negative.  Objective: Vital Signs: There were no vitals taken for this visit.  Physical Exam:  General:  Alert and oriented, in no acute distress. Pulm:  Breathing unlabored. Psy:  Normal mood, congruent affect. Skin: No rash or erythema. Knees: Trace effusion bilaterally with no warmth.  Imaging: None other than for needle guidance  Assessment & Plan: 1.  Bilateral knee osteoarthritis -Monovisc today.  Start physical therapy in January to learn a regimen for the gym.  Follow-up as needed.     Procedures: Ultrasound-guided bilateral knee injections: After sterile prep with Betadine, injected 3 cc 1% lidocaine without epinephrine and Monovisc into the lateral joint recess of both knees using ultrasound to ensure intra-articular placement.    PMFS History: There are no problems to display for this patient.  Past Medical History:  Diagnosis Date  . Arthritis   . Asthma   . Chronic pain   . Diabetes mellitus without complication ([place])   . Fibromyalgia   . Sleep apnea    did use cpap-lost 25lb-says she does not need it  . Wears contact lenses     Family History  Problem Relation Age of Onset  . Breast cancer Paternal Aunt   . Breast cancer Paternal Grandmother     Past Surgical History:  Procedure Laterality Date  . BREAST BIOPSY    . BREAST EXCISIONAL BIOPSY    . BREAST LUMPECTOMY WITH RADIOACTIVE SEED LOCALIZATION Bilateral 11/24/2014   Procedure: BILATERAL BREAST  LUMPECTOMY WITH RADIOACTIVE SEED LOCALIZATION;  Surgeon: [name], MD;  Location: [place];  Service: General;  Laterality: Bilateral;  . CHOLECYSTECTOMY    . COLONOSCOPY    . DILATION AND CURETTAGE OF UTERUS    . ESOPHAGOGASTRODUODENOSCOPY (EGD) WITH PROPOFOL N/A 07/23/2016   Procedure: ESOPHAGOGASTRODUODENOSCOPY (EGD) WITH PROPOFOL;  Surgeon: Laurence Spates, MD;  [facility];  Service: Endoscopy;  Laterality: N/A;  . ESOPHAGOGASTRODUODENOSCOPY (EGD) WITH PROPOFOL N/A 06/11/2018   Procedure: ESOPHAGOGASTRODUODENOSCOPY (EGD) WITH PROPOFOL;  Surgeon: Laurence Spates, MD;  [facility];  Service: Endoscopy;  Laterality: N/A;  . LUMBAR LAMINECTOMY  2010  . UMBILICAL HERNIA REPAIR     age 58  . UPPER GI ENDOSCOPY     Social History   Occupational History  . Not on file  Tobacco Use  . Smoking status: Never Smoker  . Smokeless tobacco: Never Used  Substance and Sexual Activity  . Alcohol use: No  . Drug use: No  . Sexual activity: Not on file

## 2019-08-05 DIAGNOSIS — J301 Allergic rhinitis due to pollen: Secondary | ICD-10-CM | POA: Diagnosis not present

## 2019-08-05 DIAGNOSIS — J3089 Other allergic rhinitis: Secondary | ICD-10-CM | POA: Diagnosis not present

## 2019-08-05 DIAGNOSIS — J3081 Allergic rhinitis due to animal (cat) (dog) hair and dander: Secondary | ICD-10-CM | POA: Diagnosis not present

## 2019-08-11 DIAGNOSIS — J301 Allergic rhinitis due to pollen: Secondary | ICD-10-CM | POA: Diagnosis not present

## 2019-08-11 DIAGNOSIS — J3089 Other allergic rhinitis: Secondary | ICD-10-CM | POA: Diagnosis not present

## 2019-08-11 DIAGNOSIS — J3081 Allergic rhinitis due to animal (cat) (dog) hair and dander: Secondary | ICD-10-CM | POA: Diagnosis not present

## 2019-08-12 DIAGNOSIS — J3081 Allergic rhinitis due to animal (cat) (dog) hair and dander: Secondary | ICD-10-CM | POA: Diagnosis not present

## 2019-08-12 DIAGNOSIS — J3089 Other allergic rhinitis: Secondary | ICD-10-CM | POA: Diagnosis not present

## 2019-08-12 DIAGNOSIS — J301 Allergic rhinitis due to pollen: Secondary | ICD-10-CM | POA: Diagnosis not present

## 2019-08-19 DIAGNOSIS — J3089 Other allergic rhinitis: Secondary | ICD-10-CM | POA: Diagnosis not present

## 2019-08-19 DIAGNOSIS — J301 Allergic rhinitis due to pollen: Secondary | ICD-10-CM | POA: Diagnosis not present

## 2019-08-19 DIAGNOSIS — J3081 Allergic rhinitis due to animal (cat) (dog) hair and dander: Secondary | ICD-10-CM | POA: Diagnosis not present

## 2019-08-20 DIAGNOSIS — J4520 Mild intermittent asthma, uncomplicated: Secondary | ICD-10-CM | POA: Diagnosis not present

## 2019-08-20 DIAGNOSIS — D649 Anemia, unspecified: Secondary | ICD-10-CM | POA: Diagnosis not present

## 2019-08-20 DIAGNOSIS — E119 Type 2 diabetes mellitus without complications: Secondary | ICD-10-CM | POA: Diagnosis not present

## 2019-08-20 DIAGNOSIS — I10 Essential (primary) hypertension: Secondary | ICD-10-CM | POA: Diagnosis not present

## 2019-08-20 DIAGNOSIS — E7800 Pure hypercholesterolemia, unspecified: Secondary | ICD-10-CM | POA: Diagnosis not present

## 2019-08-25 ENCOUNTER — Ambulatory Visit: Payer: PPO | Admitting: Physical Therapy

## 2019-08-26 ENCOUNTER — Encounter: Payer: Self-pay | Admitting: Physical Therapy

## 2019-08-26 ENCOUNTER — Ambulatory Visit: Payer: PPO | Attending: Family Medicine | Admitting: Physical Therapy

## 2019-08-26 ENCOUNTER — Other Ambulatory Visit: Payer: Self-pay

## 2019-08-26 DIAGNOSIS — M25562 Pain in left knee: Secondary | ICD-10-CM | POA: Insufficient documentation

## 2019-08-26 DIAGNOSIS — M25561 Pain in right knee: Secondary | ICD-10-CM | POA: Diagnosis not present

## 2019-08-26 DIAGNOSIS — M6281 Muscle weakness (generalized): Secondary | ICD-10-CM | POA: Diagnosis not present

## 2019-08-26 DIAGNOSIS — G8929 Other chronic pain: Secondary | ICD-10-CM | POA: Diagnosis not present

## 2019-08-26 DIAGNOSIS — R262 Difficulty in walking, not elsewhere classified: Secondary | ICD-10-CM | POA: Diagnosis not present

## 2019-08-26 DIAGNOSIS — J301 Allergic rhinitis due to pollen: Secondary | ICD-10-CM | POA: Diagnosis not present

## 2019-08-26 DIAGNOSIS — J3081 Allergic rhinitis due to animal (cat) (dog) hair and dander: Secondary | ICD-10-CM | POA: Diagnosis not present

## 2019-08-26 DIAGNOSIS — J3089 Other allergic rhinitis: Secondary | ICD-10-CM | POA: Diagnosis not present

## 2019-08-27 ENCOUNTER — Other Ambulatory Visit: Payer: Self-pay

## 2019-08-27 ENCOUNTER — Encounter: Payer: Self-pay | Admitting: Physical Therapy

## 2019-08-27 ENCOUNTER — Ambulatory Visit (INDEPENDENT_AMBULATORY_CARE_PROVIDER_SITE_OTHER): Payer: PPO | Admitting: Family Medicine

## 2019-08-27 ENCOUNTER — Encounter (INDEPENDENT_AMBULATORY_CARE_PROVIDER_SITE_OTHER): Payer: Self-pay | Admitting: Family Medicine

## 2019-08-27 VITALS — BP 136/88 | HR 113 | Temp 98.3°F | Ht 61.0 in | Wt 288.0 lb

## 2019-08-27 DIAGNOSIS — R0602 Shortness of breath: Secondary | ICD-10-CM | POA: Diagnosis not present

## 2019-08-27 DIAGNOSIS — M797 Fibromyalgia: Secondary | ICD-10-CM

## 2019-08-27 DIAGNOSIS — Z1331 Encounter for screening for depression: Secondary | ICD-10-CM | POA: Diagnosis not present

## 2019-08-27 DIAGNOSIS — Z6841 Body Mass Index (BMI) 40.0 and over, adult: Secondary | ICD-10-CM | POA: Diagnosis not present

## 2019-08-27 DIAGNOSIS — E119 Type 2 diabetes mellitus without complications: Secondary | ICD-10-CM

## 2019-08-27 DIAGNOSIS — M169 Osteoarthritis of hip, unspecified: Secondary | ICD-10-CM

## 2019-08-27 DIAGNOSIS — I10 Essential (primary) hypertension: Secondary | ICD-10-CM

## 2019-08-27 DIAGNOSIS — R5383 Other fatigue: Secondary | ICD-10-CM

## 2019-08-27 DIAGNOSIS — Z0289 Encounter for other administrative examinations: Secondary | ICD-10-CM

## 2019-08-27 NOTE — Therapy (Signed)
[place], [place], [address] Phone: [phone]   Fax:  [phone]  Physical Therapy Treatment/Re-eval   Patient Details  Name: Virginia Luna MRN: [id number] Date of Birth: 1960/09/09 Referring Provider (PT): Dr Eunice Blase    Encounter Date: 08/26/2019  PT End of Session - 08/27/19 0756    Visit Number  27    Number of Visits  33    Date for PT Re-Evaluation  10/08/19    Authorization Type  third party payer    PT Start Time  0930    PT Stop Time  1014    PT Time Calculation (min)  44 min    Activity Tolerance  Patient tolerated treatment well    Behavior During Therapy  [facility] for tasks assessed/performed       Past Medical History:  Diagnosis Date  . Allergies   . Anemia   . Arthritis   . Asthma   . Back pain   . Chronic pain   . Diabetes ([place])   . Diabetes mellitus without complication ([place])   . Edema, lower extremity   . Fibromyalgia   . High blood pressure   . History of stomach ulcers   . IBS (irritable bowel syndrome)   . Joint pain   . Sleep apnea    did use cpap-lost 25lb-says she does not need it  . Wears contact lenses     Past Surgical History:  Procedure Laterality Date  . BREAST BIOPSY    . BREAST EXCISIONAL BIOPSY    . BREAST LUMPECTOMY WITH RADIOACTIVE SEED LOCALIZATION Bilateral 11/24/2014   Procedure: BILATERAL BREAST LUMPECTOMY WITH RADIOACTIVE SEED LOCALIZATION;  Surgeon: [name], MD;  Location: [place];  Service: General;  Laterality: Bilateral;  . CHOLECYSTECTOMY    . COLONOSCOPY    . DILATION AND CURETTAGE OF UTERUS    . ESOPHAGOGASTRODUODENOSCOPY (EGD) WITH PROPOFOL N/A 07/23/2016   Procedure: ESOPHAGOGASTRODUODENOSCOPY (EGD) WITH PROPOFOL;  Surgeon: Laurence Spates, MD;  Location: [facility];  Service: Endoscopy;  Laterality: N/A;  . ESOPHAGOGASTRODUODENOSCOPY (EGD) WITH PROPOFOL N/A 06/11/2018   Procedure: ESOPHAGOGASTRODUODENOSCOPY  (EGD) WITH PROPOFOL;  Surgeon: Laurence Spates, MD;  Location: [facility];  Service: Endoscopy;  Laterality: N/A;  . LUMBAR LAMINECTOMY  2010  . UMBILICAL HERNIA REPAIR     age 59  . UPPER GI ENDOSCOPY      There were no vitals filed for this visit.  Subjective Assessment - 08/26/19 0934    Subjective  Patient reports her knees have been getting better. She had her shots in her knee. She feels like it  has helped. She has been a little sore the past few days.    Limitations  Standing    How long can you sit comfortably?  Sitting for too long stiffens her knees    How long can you stand comfortably?  varies how long she can stand    How long can you walk comfortably?  trying to walk 5 min 3 times a day    Diagnostic tests  X-ray but results arent in the computer; Per patient bone spurs and arthritis.    Currently in Pain?  Yes    Pain Score  5     Pain Location  Knee    Pain Orientation  Right    Pain Descriptors / Indicators  Aching    Pain Type  Chronic pain    Pain Onset  More than a month ago  Pain Frequency  Constant    Aggravating Factors   standing and walking    Pain Relieving Factors  rest    Effect of Pain on Daily Activities  difficulty    Multiple Pain Sites  No                       [facility] Adult PT Treatment/Exercise - 08/27/19 0001      Knee/Hip Exercises: Stretches   Active Hamstring Stretch  2 reps;20 seconds    Active Hamstring Stretch Limitations  seated hamstring stretch 3x20 sec hold       Knee/Hip Exercises: Standing   Heel Raises Limitations  x20     Hip Flexion Limitations  x20     Lateral Step Up  10 reps;Both;Hand Hold: 2;Step Height: 6";2 sets    Forward Step Up  10 reps;Hand Hold: 2;Both;Step Height: 6";2 sets    Functional Squat  20 reps      Knee/Hip Exercises: Seated   Other Seated Knee/Hip Exercises  seated clamshell 2x15 green     Hamstring Limitations  x20 each leg green       Knee/Hip Exercises: Supine   Quad Sets  Limitations  x15 5 sec hold bilateral     Short Arc Quad Sets Limitations  2x10 2lb       Manual Therapy   Manual therapy comments  reviewed self soft tissue mobilization with tiger tail     Joint Mobilization  PA and AP glides Grade II ands III with distraction     Soft tissue mobilization  to left anterior knee     Passive ROM  into knee extension              PT Education - 08/26/19 0937    Education Details  reviewed home exercises    Person(s) Educated  Patient    Methods  Explanation;Demonstration;Tactile cues;Verbal cues    Comprehension  Verbalized understanding;Verbal cues required;Tactile cues required;Returned demonstration       PT Short Term Goals - 08/27/19 0759      PT SHORT TERM GOAL #1   Title  Patient will be indepenent with basic HEP    Baseline  working on basic HEP    Time  3    Period  Weeks    Status  Achieved    Target Date  04/10/19      PT SHORT TERM GOAL #2   Title  Patient will demonstrate a 50% improvement in patella movement    Baseline  full movement bilateral    Time  3    Period  Weeks    Status  Achieved    Target Date  04/10/19      PT SHORT TERM GOAL #3   Title  Patient will extend bilateral LE without increased pain    Baseline  minor pain with edn rnage extension    Time  3    Period  Weeks    Status  Achieved    Target Date  04/10/19        PT Long Term Goals - 08/27/19 0803      PT LONG TERM GOAL #1   Title  Patient will transfer sit to stand independently without use of hands and without increased pain    Baseline  able to transfer without pressing up    Time  6    Status  Achieved      PT LONG TERM GOAL #2  Title  Patient will stand for 10 minutes without increased pain in order to perfrom ADL's    Baseline  can stand with minor pain    Time  6    Period  Weeks    Status  Achieved      PT LONG TERM GOAL #3   Title  Patient will ambualte 150' without increased pain with LRAD in order to improve ability to  go to appointments    Baseline  able to walk without device    Time  6    Period  Weeks    Status  Achieved      PT LONG TERM GOAL #4   Title  Patient            Plan - 08/27/19 0756    Clinical Impression Statement  Patient has been complaint with her exercises at home.She has maintained her strength and her right knee extension has improved. Patient and MD would like her to continue with PT to work towardsa gym program. Therapy will continue to see th epatient 1W6 to work on functional strengthening, right knee extension and progression to gym equipiment. She tolerated ther-ex well today.    Personal Factors and Comorbidities  Comorbidity 1;Comorbidity 2;Comorbidity 3+    Comorbidities  fibromaylgia, anxiety, obesity    Examination-Activity Limitations  Transfers;Locomotion Level;Sit;Sleep;Squat;Stairs;Stand    Stability/Clinical Decision Making  Evolving/Moderate complexity    Rehab Potential  Good    PT Frequency  1x / week    PT Duration  6 weeks    PT Treatment/Interventions  ADLs/Self Care Home Management;Cryotherapy;Electrical Stimulation;Moist Heat;Traction;Ultrasound;DME Instruction;Stair training;Functional mobility training;Therapeutic activities;Therapeutic exercise;Gait training;Neuromuscular re-education;Patient/family education;Passive range of motion;Manual techniques;Taping;Spinal Manipulations;Joint Manipulations    PT Next Visit Plan  continue with progression into gym activity    PT Home Exercise Plan  quad set, seated clam with band, self patella mobs, supine heel slides, bridge, SAQ, LAQ sink squat, mini lunge forward, side and backward with UE support. hip hinge and deadlift on raised step    Consulted and Agree with Plan of Care  Patient       Patient will benefit from skilled therapeutic intervention in order to improve the following deficits and impairments:  Abnormal gait, Decreased range of motion, Difficulty walking, Obesity, Decreased endurance,  Decreased mobility, Decreased strength, Improper body mechanics, Decreased activity tolerance, Pain  Visit Diagnosis: Chronic pain of left knee  Difficulty in walking, not elsewhere classified  Chronic pain of right knee  Muscle weakness (generalized)     Problem List There are no problems to display for this patient.   Carney Living PT DPT  08/27/2019, 9:04 AM  [facility] [address] [place], [place], [address] Phone: [phone]   Fax:  [phone]  Name: DORANNA CZERWONKA MRN: [id number] Date of Birth: Aug 03, 1961

## 2019-08-27 NOTE — Addendum Note (Signed)
Addended by: Carney Living on: 08/27/2019 09:06 AM   Modules accepted: Orders

## 2019-08-28 LAB — COMPREHENSIVE METABOLIC PANEL
ALT: 12 IU/L (ref 0–32)
AST: 9 IU/L (ref 0–40)
Albumin/Globulin Ratio: 1.6 (ref 1.2–2.2)
Albumin: 4.5 g/dL (ref 3.8–4.9)
Alkaline Phosphatase: 157 IU/L — ABNORMAL HIGH (ref 39–117)
BUN/Creatinine Ratio: 21 (ref 9–23)
BUN: 14 mg/dL (ref 6–24)
Bilirubin Total: 0.4 mg/dL (ref 0.0–1.2)
CO2: 26 mmol/L (ref 20–29)
Calcium: 9.9 mg/dL (ref 8.7–10.2)
Chloride: 103 mmol/L (ref 96–106)
Creatinine, Ser: 0.67 mg/dL (ref 0.57–1.00)
GFR calc Af Amer: 113 mL/min/{1.73_m2} (ref >59)
GFR calc non Af Amer: 98 mL/min/{1.73_m2} (ref >59)
Globulin, Total: 2.8 g/dL (ref 1.5–4.5)
Glucose: 147 mg/dL — ABNORMAL HIGH (ref 65–99)
Potassium: 4.6 mmol/L (ref 3.5–5.2)
Sodium: 145 mmol/L — ABNORMAL HIGH (ref 134–144)
Total Protein: 7.3 g/dL (ref 6.0–8.5)

## 2019-08-28 LAB — T3: T3, Total: 99 ng/dL (ref 71–180)

## 2019-08-28 LAB — CBC WITH DIFFERENTIAL/PLATELET
Basophils Absolute: 0 10*3/uL (ref 0.0–0.2)
Basos: 0 %
EOS (ABSOLUTE): 0.2 10*3/uL (ref 0.0–0.4)
Eos: 2 %
Hemoglobin: 11.6 g/dL (ref 11.1–15.9)
Immature Grans (Abs): 0.1 10*3/uL (ref 0.0–0.1)
Immature Granulocytes: 1 %
Lymphocytes Absolute: 1.6 10*3/uL (ref 0.7–3.1)
Lymphs: 20 %
MCH: 26.7 pg (ref 26.6–33.0)
MCHC: 31.4 g/dL — ABNORMAL LOW (ref 31.5–35.7)
MCV: 85 fL (ref 79–97)
Monocytes Absolute: 0.4 10*3/uL (ref 0.1–0.9)
Monocytes: 4 %
Neutrophils Absolute: 5.9 10*3/uL (ref 1.4–7.0)
Neutrophils: 73 %
Platelets: 474 10*3/uL — ABNORMAL HIGH (ref 150–450)
RBC: 4.34 x10E6/uL (ref 3.77–5.28)
RDW: 16.2 % — ABNORMAL HIGH (ref 11.7–15.4)
WBC: 8.1 10*3/uL (ref 3.4–10.8)

## 2019-08-28 LAB — ANEMIA PANEL
Ferritin: 62 ng/mL (ref 15–150)
Folate, Hemolysate: 305 ng/mL
Folate, RBC: 824 ng/mL (ref >498)
Hematocrit: 37 % (ref 34.0–46.6)
Iron Saturation: 13 % — ABNORMAL LOW (ref 15–55)
Iron: 45 ug/dL (ref 27–159)
Retic Ct Pct: 1.3 % (ref 0.6–2.6)
Total Iron Binding Capacity: 351 ug/dL (ref 250–450)
UIBC: 306 ug/dL (ref 131–425)
Vitamin B-12: 350 pg/mL (ref 232–1245)

## 2019-08-28 LAB — TSH: TSH: 1.49 u[IU]/mL (ref 0.450–4.500)

## 2019-08-28 LAB — T4, FREE: Free T4: 1.2 ng/dL (ref 0.82–1.77)

## 2019-08-28 LAB — INSULIN, RANDOM: INSULIN: 15.9 u[IU]/mL (ref 2.6–24.9)

## 2019-08-28 LAB — VITAMIN D 25 HYDROXY (VIT D DEFICIENCY, FRACTURES): Vit D, 25-Hydroxy: 11.2 ng/mL — ABNORMAL LOW (ref 30.0–100.0)

## 2019-08-28 NOTE — Progress Notes (Signed)
Dear Virginia Pupa, MD,   Thank you for referring Virginia Luna to our clinic. The following note includes my evaluation and treatment recommendations.  Chief Complaint:   Chief Complaint: Virginia Luna (MR# [id number]) is a 59 y.o. female who presents for evaluation and treatment of obesity and related comorbidities. Current BMI is Body mass index is 54.42 kg/m.  Virginia Luna has been struggling with her weight for many years and has been unsuccessful in either losing weight, maintaining weight loss, or reaching her healthy weight goal.  Virginia Luna states she is currently in the action stage of change and ready to dedicate time achieving and maintaining a healthier weight. Virginia Luna is interested in becoming our patient and working on intensive lifestyle modifications including (but not limited to) diet and exercise for weight loss.  Virginia Luna's habits were reviewed today and are as follows: her heaviest weight ever was 283 pounds, she sometimes, she drinks liquids with calories and she sometimes makes poor food choices.   Subjective:   1. Other fatigue Virginia Luna feels her energy is lower than it should be. This has worsened with weight gain and has worsened recently. Virginia Luna denies daytime somnolence and admits to sometimes waking up still tired.  Patient has a history of obstructive sleep apnea.  She used to use a CPAP machine, but it made her vomit so she stopped using it.  Patient generally gets 4 hours of sleep per night, and states they generally have poor sleep quality. Snoring is present. Apneic episodes are not present. Epworth Sleepiness Score is 6.  2. Shortness of breath on exertion Virginia Luna notes increasing shortness of breath with exercising and seems to be worsening over time with weight gain. She notes getting out of breath sooner with activity than she used to. This has gotten worse recently. Virginia Luna denies shortness of breath at rest or orthopnea.  3. Osteoarthritis  of hip The patient is seeing Dr. Derry Lory and doing physical therapy for this.  Notes have been reviewed.   4. Type 2 diabetes mellitus without complication, without long-term current use of insulin (HCC) Virginia Luna's A1c was 7.0 when she was seen by her PCP 2 months ago.    No results found for: HGBA1C Lab Results  Component Value Date   CREATININE 0.67 08/27/2019   5. Essential hypertension Patient's blood pressure was increased today with an increased pulse.  No chest pain, shortness of breath, edema, or headache.  BP Readings from Last 3 Encounters:  08/27/19 136/88  06/11/18 136/84  07/23/16 (!) 170/96   6. Fibromyalgia The symptoms are of worsening severity. Kylianne is doing physical therapy to help.   7. Depression screening Kennisha's Food and Mood (modified PHQ-9) score was 7.  Depression screen [facility] 2/9 08/27/2019  Decreased Interest 3  Down, Depressed, Hopeless 0  PHQ - 2 Score 3  Altered sleeping 1  Tired, decreased energy 1  Change in appetite 1  Feeling bad or failure about yourself  1  Trouble concentrating 0  Moving slowly or fidgety/restless 0  Suicidal thoughts 0  PHQ-9 Score 7  Difficult doing work/chores Somewhat difficult   Assessment/Plan:   1. Other fatigue Virginia Luna was informed fatigue may be related to obesity, depression or many other causes. Labs will be ordered, and in the meanwhile Marty has agreed to work on diet, exercise and weight loss.  Orders - EKG 12-Lead - Anemia panel - Vitamin D (25 hydroxy) - T3 - T4, free - TSH - CBC  w/Diff/Platelet  2. Shortness of breath on exertion Virginia Luna's shortness of breath appears to be obesity related and exercise induced. She has agreed to work on weight loss and gradually increase exercise to treat her exercise induced shortness of breath. Will continue to monitor closely.  3. Osteoarthritis of hip, unspecified laterality, unspecified osteoarthritis type We will continue to monitor. Orders  and follow up as documented in patient record. Counseling: Intensive lifestyle modifications are the first line treatment for this issue. We discussed several lifestyle modifications today and she will continue to work on diet, exercise and weight loss efforts.  4. Type 2 diabetes mellitus without complication, without long-term current use of insulin (HCC) Virginia Luna has been given diabetes education by myself today. Good blood sugar control is important to decrease the likelihood of diabetic complications such as nephropathy, neuropathy, limb loss, blindness, coronary artery disease, and death. Intensive lifestyle modification including diet, exercise and weight loss were discussed as the first line treatment for diabetes.   Orders - Insulin, random - Comprehensive Metabolic Panel (CMET)  5. Essential hypertension Virginia Luna is working on healthy weight loss and exercise to improve blood pressure control. We will watch for signs of hypotension as she continues her lifestyle modifications.  6. Fibromyalgia Intensive lifestyle modifications are the first line treatment for this issue. We discussed several lifestyle modifications today and she will continue to work on diet, exercise and weight loss efforts.We will continue to monitor. Orders and follow up as documented in patient record.   Counseling . Try [email or web address]-[email or web address]/, which is a series of self-care modules designed to teach patients several techniques to manage pain.   7. Class 3 severe obesity with serious comorbidity and body mass index (BMI) of 50.0 to 59.9 in adult, unspecified obesity type (HCC) Virginia Luna is currently in the action stage of change and her goal is to continue with weight loss efforts. I recommend Virginia Luna begin the structured treatment plan as follows:  She has agreed to on the Category 2 Plan.  We discussed the following exercise goals today: For substantial health benefits, adults should do at least 150  minutes (2 hours and 30 minutes) a week of moderate-intensity, or 75 minutes (1 hour and 15 minutes) a week of vigorous-intensity aerobic physical activity, or an equivalent combination of moderate- and vigorous-intensity aerobic activity. Aerobic activity should be performed in episodes of at least 10 minutes, and preferably, it should be spread throughout the week. Adults should also include muscle-strengthening activities that involve all major muscle groups on 2 or more days a week. We discussed the following behavioral modification strategies today: increasing lean protein intake, decreasing simple carbohydrates, increasing vegetables, increasing water intake and meal planning and cooking strategies.  She was informed of the importance of frequent follow-up visits to maximize her success with intensive lifestyle modifications for her multiple health conditions. She was informed we would discuss her lab results at her next visit unless there is a critical issue that needs to be addressed sooner. Gertrude agreed to keep her next visit at the agreed upon time to discuss these results.  Objective:   Blood pressure 136/88, pulse (!) 113, temperature 98.3 F (36.8 C), height 5\' 1"  (1.549 m), weight 288 lb (130.6 kg), SpO2 97 %. Body mass index is 54.42 kg/m.  EKG: Normal sinus rhythm, rate 109 bpm.  Indirect Calorimeter completed today shows a VO2 of 243 and a REE of 1693.  Her calculated basal metabolic rate is 0000000 thus her basal metabolic rate is worse than  expected.  General: Cooperative, alert, well developed, in no acute distress. HEENT: Conjunctivae and lids unremarkable. Neck: No thyromegaly.  Cardiovascular: Regular rhythm.  Lungs: Normal work of breathing. Extremities: No edema.  Neurologic: No focal deficits.   Lab Results  Component Value Date   CREATININE 0.67 08/27/2019   BUN 14 08/27/2019   NA 145 (H) 08/27/2019   K 4.6 08/27/2019   CL 103 08/27/2019   CO2 26 08/27/2019    Lab Results  Component Value Date   ALT 12 08/27/2019   AST 9 08/27/2019   ALKPHOS 157 (H) 08/27/2019   BILITOT 0.4 08/27/2019   No results found for: HGBA1C Lab Results  Component Value Date   INSULIN 15.9 08/27/2019   Lab Results  Component Value Date   TSH 1.490 08/27/2019   No results found for: CHOL, HDL, LDLCALC, LDLDIRECT, TRIG, CHOLHDL Lab Results  Component Value Date   WBC 8.1 08/27/2019   HGB 11.6 08/27/2019   HCT 37.0 08/27/2019   MCV 85 08/27/2019   PLT 474 (H) 08/27/2019   Lab Results  Component Value Date   IRON 45 08/27/2019   TIBC 351 08/27/2019   FERRITIN 62 08/27/2019   Attestation Statements:   Reviewed by clinician on day of visit: allergies, medications, problem list, medical history, surgical history, family history, social history and previous encounter notes.  This visit occurred during the SARS-CoV-2 public health emergency.  Safety protocols were in place, including screening questions prior to the visit, additional usage of staff PPE, and extensive cleaning of exam room while observing appropriate contact time as indicated for disinfecting solutions. (CPT [id number])  I, Water quality scientist, CMA, am acting as transcriptionist for [employer], DO.  I have reviewed the above documentation for accuracy and completeness, and I agree with the above. Briscoe Deutscher, DO

## 2019-08-31 DIAGNOSIS — J4520 Mild intermittent asthma, uncomplicated: Secondary | ICD-10-CM | POA: Diagnosis not present

## 2019-08-31 DIAGNOSIS — I10 Essential (primary) hypertension: Secondary | ICD-10-CM | POA: Diagnosis not present

## 2019-08-31 DIAGNOSIS — E7800 Pure hypercholesterolemia, unspecified: Secondary | ICD-10-CM | POA: Diagnosis not present

## 2019-08-31 DIAGNOSIS — D649 Anemia, unspecified: Secondary | ICD-10-CM | POA: Diagnosis not present

## 2019-08-31 DIAGNOSIS — E119 Type 2 diabetes mellitus without complications: Secondary | ICD-10-CM | POA: Diagnosis not present

## 2019-09-01 DIAGNOSIS — K219 Gastro-esophageal reflux disease without esophagitis: Secondary | ICD-10-CM | POA: Diagnosis not present

## 2019-09-01 DIAGNOSIS — E559 Vitamin D deficiency, unspecified: Secondary | ICD-10-CM | POA: Diagnosis not present

## 2019-09-01 DIAGNOSIS — E7800 Pure hypercholesterolemia, unspecified: Secondary | ICD-10-CM | POA: Diagnosis not present

## 2019-09-01 DIAGNOSIS — E119 Type 2 diabetes mellitus without complications: Secondary | ICD-10-CM | POA: Diagnosis not present

## 2019-09-01 DIAGNOSIS — G894 Chronic pain syndrome: Secondary | ICD-10-CM | POA: Diagnosis not present

## 2019-09-01 DIAGNOSIS — I10 Essential (primary) hypertension: Secondary | ICD-10-CM | POA: Diagnosis not present

## 2019-09-01 DIAGNOSIS — J4520 Mild intermittent asthma, uncomplicated: Secondary | ICD-10-CM | POA: Diagnosis not present

## 2019-09-01 DIAGNOSIS — M797 Fibromyalgia: Secondary | ICD-10-CM | POA: Diagnosis not present

## 2019-09-02 ENCOUNTER — Ambulatory Visit: Payer: PPO | Admitting: Physical Therapy

## 2019-09-02 ENCOUNTER — Encounter: Payer: Self-pay | Admitting: Physical Therapy

## 2019-09-02 ENCOUNTER — Other Ambulatory Visit: Payer: Self-pay

## 2019-09-02 DIAGNOSIS — J3089 Other allergic rhinitis: Secondary | ICD-10-CM | POA: Diagnosis not present

## 2019-09-02 DIAGNOSIS — G8929 Other chronic pain: Secondary | ICD-10-CM

## 2019-09-02 DIAGNOSIS — J3081 Allergic rhinitis due to animal (cat) (dog) hair and dander: Secondary | ICD-10-CM | POA: Diagnosis not present

## 2019-09-02 DIAGNOSIS — M6281 Muscle weakness (generalized): Secondary | ICD-10-CM

## 2019-09-02 DIAGNOSIS — J301 Allergic rhinitis due to pollen: Secondary | ICD-10-CM | POA: Diagnosis not present

## 2019-09-02 DIAGNOSIS — M25562 Pain in left knee: Secondary | ICD-10-CM | POA: Diagnosis not present

## 2019-09-02 DIAGNOSIS — R262 Difficulty in walking, not elsewhere classified: Secondary | ICD-10-CM

## 2019-09-02 NOTE — Therapy (Signed)
[place], [place], [address] Phone: [phone]   Fax:  [phone]  Physical Therapy Treatment  Patient Details  Name: Virginia Luna MRN: [id number] Date of Birth: [birth year] Referring Provider (PT): Dr Eunice Blase    Encounter Date: 09/02/2019  PT End of Session - 09/02/19 0840    Visit Number  28    Number of Visits  33    Date for PT Re-Evaluation  10/08/19    Authorization Type  third party payer    PT Start Time  [phone]    PT Stop Time  0842    PT Time Calculation (min)  44 min    Activity Tolerance  Patient tolerated treatment well    Behavior During Therapy  [facility] for tasks assessed/performed       Past Medical History:  Diagnosis Date  . Allergies   . Anemia   . Arthritis   . Asthma   . Back pain   . Chronic pain   . Diabetes ([place])   . Diabetes mellitus without complication ([place])   . Edema, lower extremity   . Fibromyalgia   . High blood pressure   . History of stomach ulcers   . IBS (irritable bowel syndrome)   . Joint pain   . Sleep apnea    did use cpap-lost 25lb-says she does not need it  . Wears contact lenses     Past Surgical History:  Procedure Laterality Date  . BREAST BIOPSY    . BREAST EXCISIONAL BIOPSY    . BREAST LUMPECTOMY WITH RADIOACTIVE SEED LOCALIZATION Bilateral 11/24/2014   Procedure: BILATERAL BREAST LUMPECTOMY WITH RADIOACTIVE SEED LOCALIZATION;  Surgeon: [name], MD;  Location: [facility];  Service: General;  Laterality: Bilateral;  . CHOLECYSTECTOMY    . COLONOSCOPY    . DILATION AND CURETTAGE OF UTERUS    . ESOPHAGOGASTRODUODENOSCOPY (EGD) WITH PROPOFOL N/A 07/23/2016   Procedure: ESOPHAGOGASTRODUODENOSCOPY (EGD) WITH PROPOFOL;  Surgeon: Laurence Spates, MD;  Location: [facility];  Service: Endoscopy;  Laterality: N/A;  . ESOPHAGOGASTRODUODENOSCOPY (EGD) WITH PROPOFOL N/A 06/11/2018   Procedure: ESOPHAGOGASTRODUODENOSCOPY (EGD)  WITH PROPOFOL;  Surgeon: Laurence Spates, MD;  Location: [facility];  Service: Endoscopy;  Laterality: N/A;  . LUMBAR LAMINECTOMY  2010  . UMBILICAL HERNIA REPAIR     age 58  . UPPER GI ENDOSCOPY      There were no vitals filed for this visit.  Subjective Assessment - 09/02/19 0831    Subjective  Patient fell thids morning and hit her right knee., it is sore. She feels like her legs just gave out on her. She was able toget up. Her left knee isn;t hurting much.    Limitations  Standing    How long can you sit comfortably?  Sitting for too long stiffens her knees    How long can you stand comfortably?  varies how long she can stand    How long can you walk comfortably?  trying to walk 5 min 3 times a day    Diagnostic tests  X-ray but results arent in the computer; Per patient bone spurs and arthritis.    Currently in Pain?  Yes    Pain Score  5     Pain Location  Knee    Pain Orientation  Right    Pain Descriptors / Indicators  Aching    Pain Type  Chronic pain    Pain Onset  More than a month ago  Pain Frequency  Constant    Aggravating Factors   stnaidng and walking    Pain Relieving Factors  rest    Effect of Pain on Daily Activities  difficulty walking                       [facility] Adult PT Treatment/Exercise - 09/02/19 0001      Self-Care   Other Self-Care Comments   reviewed how to begin a gym program; reviewed adjusting weights at the gym; improtance of proper fit on machines       Knee/Hip Exercises: Stretches   Active Hamstring Stretch  2 reps;20 seconds    Active Hamstring Stretch Limitations  seated hamstring stretch 3x20 sec hold       Knee/Hip Exercises: Machines for Strengthening   Cybex Knee Extension  2x10 15 lbs     Cybex Knee Flexion  2x10 x15 lbs     Cybex Leg Press  2x15 30 lb     Hip Cybex  hip abduction and extension 2x10 10 lb       Knee/Hip Exercises: Supine   Quad Sets Limitations  x15 5 sec hold     Short Arc Quad Sets Limitations   2x10 2lb       Manual Therapy   Manual therapy comments  reviewed self soft tissue mobilization with tiger tail     Joint Mobilization  PA and AP glides Grade II ands III with distraction     Soft tissue mobilization  to left anterior knee     Passive ROM  into knee extension              PT Education - 09/02/19 0839    Education Details  reviewed use of gym equipment    Person(s) Educated  Patient    Methods  Explanation;Demonstration;Tactile cues;Verbal cues    Comprehension  Verbalized understanding;Returned demonstration;Verbal cues required;Tactile cues required       PT Short Term Goals - 09/02/19 1322      PT SHORT TERM GOAL #1   Title  Patient will be indepenent with basic HEP    Baseline  working on basic HEP    Time  3    Period  Weeks    Status  Achieved    Target Date  04/10/19      PT SHORT TERM GOAL #2   Title  Patient will demonstrate a 50% improvement in patella movement    Baseline  full movement bilateral    Time  3    Period  Weeks    Status  Achieved    Target Date  04/10/19      PT SHORT TERM GOAL #3   Title  Patient will extend bilateral LE without increased pain    Baseline  minor pain with edn rnage extension    Time  3    Period  Weeks    Status  Achieved    Target Date  04/10/19        PT Long Term Goals - 08/27/19 0803      PT LONG TERM GOAL #1   Title  Patient will transfer sit to stand independently without use of hands and without increased pain    Baseline  able to transfer without pressing up    Time  6    Status  Achieved      PT LONG TERM GOAL #2   Title  Patient will stand for 10 minutes  without increased pain in order to perfrom ADL's    Baseline  can stand with minor pain    Time  6    Period  Weeks    Status  Achieved      PT LONG TERM GOAL #3   Title  Patient will ambualte 150' without increased pain with LRAD in order to improve ability to go to appointments    Baseline  able to walk without device    Time   6    Period  Weeks    Status  Achieved      PT LONG TERM GOAL #4   Title  Patient            Plan - 09/02/19 0846    Clinical Impression Statement  Despite pain at the nbeggining of the session patient tolerated treatment well. She had a mild increase in right knee knee cap restriction which improved with manual therapy. She reported decreased pain with light distraction and PA glides to reduce pain. She was shown how to use gym equimpent. She was shown how gym equipment is to be fitrted and therapy talked about weights with her as well as progression. She had no increase in pain and tolerated machine exercises well. Therapy will continue to advance exercises and also talk to her about kettle bells and free weights    Personal Factors and Comorbidities  Comorbidity 1;Comorbidity 2;Comorbidity 3+    Comorbidities  fibromaylgia, anxiety, obesity    Examination-Activity Limitations  Transfers;Locomotion Level;Sit;Sleep;Squat;Stairs;Stand    Examination-Participation Restrictions  Community Activity;Cleaning;Laundry    Stability/Clinical Decision Making  Evolving/Moderate complexity    Clinical Decision Making  Moderate    Rehab Potential  Good    PT Frequency  1x / week    PT Duration  6 weeks    PT Treatment/Interventions  ADLs/Self Care Home Management;Cryotherapy;Electrical Stimulation;Moist Heat;Traction;Ultrasound;DME Instruction;Stair training;Functional mobility training;Therapeutic activities;Therapeutic exercise;Gait training;Neuromuscular re-education;Patient/family education;Passive range of motion;Manual techniques;Taping;Spinal Manipulations;Joint Manipulations    PT Next Visit Plan  continue with progression into gym activity    PT Home Exercise Plan  quad set, seated clam with band, self patella mobs, supine heel slides, bridge, SAQ, LAQ sink squat, mini lunge forward, side and backward with UE support. hip hinge and deadlift on raised step    Consulted and Agree with Plan of  Care  Patient       Patient will benefit from skilled therapeutic intervention in order to improve the following deficits and impairments:  Abnormal gait, Decreased range of motion, Difficulty walking, Obesity, Decreased endurance, Decreased mobility, Decreased strength, Improper body mechanics, Decreased activity tolerance, Pain  Visit Diagnosis: Chronic pain of left knee  Difficulty in walking, not elsewhere classified  Chronic pain of right knee  Muscle weakness (generalized)     Problem List There are no problems to display for this patient.   Carney Living PT DPT  09/02/2019, 1:30 PM  [facility] [address], [place], [address] Phone: [phone]   Fax:  [phone]  Name: Virginia Luna MRN: [id number] Date of Birth: 1961-02-09

## 2019-09-09 ENCOUNTER — Other Ambulatory Visit: Payer: Self-pay

## 2019-09-09 ENCOUNTER — Ambulatory Visit: Payer: PPO | Admitting: Physical Therapy

## 2019-09-09 ENCOUNTER — Encounter: Payer: Self-pay | Admitting: Physical Therapy

## 2019-09-09 DIAGNOSIS — J3081 Allergic rhinitis due to animal (cat) (dog) hair and dander: Secondary | ICD-10-CM | POA: Diagnosis not present

## 2019-09-09 DIAGNOSIS — J3089 Other allergic rhinitis: Secondary | ICD-10-CM | POA: Diagnosis not present

## 2019-09-09 DIAGNOSIS — M25562 Pain in left knee: Secondary | ICD-10-CM | POA: Diagnosis not present

## 2019-09-09 DIAGNOSIS — J301 Allergic rhinitis due to pollen: Secondary | ICD-10-CM | POA: Diagnosis not present

## 2019-09-09 DIAGNOSIS — G8929 Other chronic pain: Secondary | ICD-10-CM

## 2019-09-09 DIAGNOSIS — M6281 Muscle weakness (generalized): Secondary | ICD-10-CM

## 2019-09-09 DIAGNOSIS — R262 Difficulty in walking, not elsewhere classified: Secondary | ICD-10-CM

## 2019-09-10 ENCOUNTER — Encounter (INDEPENDENT_AMBULATORY_CARE_PROVIDER_SITE_OTHER): Payer: Self-pay | Admitting: Family Medicine

## 2019-09-10 ENCOUNTER — Ambulatory Visit (INDEPENDENT_AMBULATORY_CARE_PROVIDER_SITE_OTHER): Payer: PPO | Admitting: Family Medicine

## 2019-09-10 ENCOUNTER — Encounter: Payer: Self-pay | Admitting: Physical Therapy

## 2019-09-10 VITALS — BP 136/83 | HR 99 | Temp 98.6°F | Ht 61.0 in | Wt 281.0 lb

## 2019-09-10 DIAGNOSIS — E7849 Other hyperlipidemia: Secondary | ICD-10-CM

## 2019-09-10 DIAGNOSIS — Z6841 Body Mass Index (BMI) 40.0 and over, adult: Secondary | ICD-10-CM

## 2019-09-10 DIAGNOSIS — E559 Vitamin D deficiency, unspecified: Secondary | ICD-10-CM

## 2019-09-10 DIAGNOSIS — E119 Type 2 diabetes mellitus without complications: Secondary | ICD-10-CM | POA: Diagnosis not present

## 2019-09-10 DIAGNOSIS — F3289 Other specified depressive episodes: Secondary | ICD-10-CM | POA: Diagnosis not present

## 2019-09-10 DIAGNOSIS — M797 Fibromyalgia: Secondary | ICD-10-CM

## 2019-09-10 DIAGNOSIS — M1710 Unilateral primary osteoarthritis, unspecified knee: Secondary | ICD-10-CM | POA: Diagnosis not present

## 2019-09-10 DIAGNOSIS — M179 Osteoarthritis of knee, unspecified: Secondary | ICD-10-CM

## 2019-09-10 DIAGNOSIS — I10 Essential (primary) hypertension: Secondary | ICD-10-CM

## 2019-09-10 MED ORDER — VITAMIN D (ERGOCALCIFEROL) 1.25 MG (50000 UNIT) PO CAPS: 50000.0000 [IU] | ORAL_CAPSULE | ORAL | 0 refills | Status: DC

## 2019-09-10 NOTE — Progress Notes (Signed)
Chief Complaint:   OBESITY Virginia Luna is here to discuss her progress with her obesity treatment plan along with follow-up of her obesity related diagnoses. Virginia Luna is on the Category 2 Plan and states she is following her eating plan approximately 80% of the time. Virginia Luna states she is walking and/or PT for 60 minutes 3-4 times per week.  Today's visit was #: 2 Starting weight: 288 lbs Starting date: 08/27/2019 Today's weight: 281 lbs Today's date: 09/10/2019 Total lbs lost to date: 7 lbs Total lbs lost since last in-office visit: 7 lbs  Interim History: Virginia Luna did well with the diet.  She likes the plan and is able to follow it easily.    Subjective:   1. Type 2 diabetes mellitus without complication, without long-term current use of insulin (HCC) Medications reviewed. Patient is taking metformin 500 XR at bedtime.  She has been taking this for years.  Last A1c 7.0.  Lab Results  Component Value Date   CREATININE 0.67 08/27/2019   Lab Results  Component Value Date   INSULIN 15.9 08/27/2019   2. Essential hypertension Review: taking medications as instructed, no medication side effects noted, no chest pain on exertion, no dyspnea on exertion, no swelling of ankles.  She is taking amlodipine for blood pressure control.  BP is slightly above goal.  BP Readings from Last 3 Encounters:  09/10/19 136/83  08/27/19 136/88  06/11/18 136/84   3. Fibromyalgia The symptoms are of moderate severity.    4. Other hyperlipidemia Virginia Luna has hyperlipidemia and has been trying to improve her cholesterol levels with intensive lifestyle modification including a low saturated fat diet, exercise and weight loss. She does not endorse any chest pain, claudication or myalgias.  She is not taking any medications for this currently.  5. Vitamin D deficiency Virginia Luna Vitamin D level was 11.2 on 08/27/2019. She is not currently taking vit D. She denies nausea, vomiting or muscle weakness.  6.  Osteoarthritis of knee, unspecified laterality, unspecified osteoarthritis type Virginia Luna has pain in bilateral knees.  She is in physical therapy and is followed by Dr. Derry Lory.  She takes diclofenac, Norco as needed, and oxycodone as needed if the pain is severe.  7. Other depression, emotional eating Virginia Luna is struggling with emotional eating and using food for comfort to the extent that it is negatively impacting her health. She has been working on behavior modification techniques to help reduce her emotional eating and has been unsuccessful. She shows no sign of suicidal or homicidal ideations.  Assessment/Plan:   1. Type 2 diabetes mellitus without complication, without long-term current use of insulin (HCC) Good blood sugar control is important to decrease the likelihood of diabetic complications such as nephropathy, neuropathy, limb loss, blindness, coronary artery disease, and death. Intensive lifestyle modification including diet, exercise and weight loss are the first line of treatment for diabetes.  Will consider increasing metformin dose if weight loss stalls.  2. Essential hypertension Virginia Luna is working on healthy weight loss and exercise to improve blood pressure control. We will watch for signs of hypotension as she continues her lifestyle modifications.  3. Fibromyalgia Intensive lifestyle modifications are the first line treatment for this issue. We discussed several lifestyle modifications today and she will continue to work on diet, exercise and weight loss efforts.We will continue to monitor. Orders and follow up as documented in patient record.   Counseling . Try [email or web address]/, which is a series of self-care modules designed to teach patients several techniques to  manage pain.   4. Other hyperlipidemia Cardiovascular risk and specific lipid/LDL goals reviewed.  We discussed several lifestyle modifications today and Virginia Luna will continue to work on diet,  exercise and weight loss efforts. Orders and follow up as documented in patient record.  Will check fasting lipid panel in 1 month.  Counseling Intensive lifestyle modifications are the first line treatment for this issue. . Dietary changes: Increase soluble fiber. Decrease simple carbohydrates. . Exercise changes: Moderate to vigorous-intensity aerobic activity 150 minutes per week if tolerated. . Lipid-lowering medications: see documented in medical record.  5. Vitamin D deficiency Low Vitamin D level contributes to fatigue and are associated with obesity, breast, and colon cancer. She agrees to continue to take prescription Vitamin D @50 ,000 IU every week and will follow-up for routine testing of Vitamin D, at least 2-3 times per year to avoid over-replacement.  - Vitamin D, Ergocalciferol, (DRISDOL) 1.25 MG (50000 UNIT) CAPS capsule; Take 1 capsule (50,000 Units total) by mouth every 7 (seven) days.  Dispense: 4 capsule; Refill: 0  6. Osteoarthritis of knee, unspecified laterality, unspecified osteoarthritis type Will follow because mobility and pain control are important for weight management.  7. Other depression, emotional eating Behavior modification techniques were discussed today to help Virginia Luna deal with her emotional/non-hunger eating behaviors.  Orders and follow up as documented in patient record.   8. Class 3 severe obesity with serious comorbidity and body mass index (BMI) of 50.0 to 59.9 in adult, unspecified obesity type (HCC) Virginia Luna is currently in the action stage of change. As such, her goal is to continue with weight loss efforts. She has agreed to the Category 2 Plan.   Exercise goals: For substantial health benefits, adults should do at least 150 minutes (2 hours and 30 minutes) a week of moderate-intensity, or 75 minutes (1 hour and 15 minutes) a week of vigorous-intensity aerobic physical activity, or an equivalent combination of moderate- and vigorous-intensity  aerobic activity. Aerobic activity should be performed in episodes of at least 10 minutes, and preferably, it should be spread throughout the week. Adults should also include muscle-strengthening activities that involve all major muscle groups on 2 or more days a week.  Behavioral modification strategies: increasing water intake.  Betsy has agreed to follow-up with our clinic in 2 weeks. She was informed of the importance of frequent follow-up visits to maximize her success with intensive lifestyle modifications for her multiple health conditions.   Objective:   Blood pressure 136/83, pulse 99, temperature 98.6 F (37 C), temperature source Oral, height 5\' 1"  (1.549 m), weight 281 lb (127.5 kg), SpO2 97 %. Body mass index is 53.09 kg/m.  General: Cooperative, alert, well developed, in no acute distress. HEENT: Conjunctivae and lids unremarkable. Cardiovascular: Regular rhythm.  Lungs: Normal work of breathing. Neurologic: No focal deficits.   Lab Results  Component Value Date   CREATININE 0.67 08/27/2019   BUN 14 08/27/2019   NA 145 (H) 08/27/2019   K 4.6 08/27/2019   CL 103 08/27/2019   CO2 26 08/27/2019   Lab Results  Component Value Date   ALT 12 08/27/2019   AST 9 08/27/2019   ALKPHOS 157 (H) 08/27/2019   BILITOT 0.4 08/27/2019   No results found for: HGBA1C Lab Results  Component Value Date   INSULIN 15.9 08/27/2019   Lab Results  Component Value Date   TSH 1.490 08/27/2019   No results found for: CHOL, HDL, LDLCALC, LDLDIRECT, TRIG, CHOLHDL Lab Results  Component Value Date  WBC 8.1 08/27/2019   HGB 11.6 08/27/2019   HCT 37.0 08/27/2019   MCV 85 08/27/2019   PLT 474 (H) 08/27/2019   Lab Results  Component Value Date   IRON 45 08/27/2019   TIBC 351 08/27/2019   FERRITIN 62 08/27/2019   Attestation Statements:   Reviewed by clinician on day of visit: allergies, medications, problem list, medical history, surgical history, family history, social  history, and previous encounter notes.   I performed a medically necessary appropriate examination and/or evaluation. I discussed the assessment and treatment plan with the patient. The patient was provided an opportunity to ask questions and all were answered. The patient agreed with the plan and demonstrated an understanding of the instructions. Clinical information was updated and documented in the EMR. Time spent on visit including pre-visit chart review and post-visit care was 45 minutes.  I, Water quality scientist, CMA, am acting as Location manager for [employer], DO.  I have reviewed the above documentation for accuracy and completeness, and I agree with the above. Briscoe Deutscher, DO

## 2019-09-10 NOTE — Therapy (Signed)
[place], [place], [address] Phone: [phone]   Fax:  [phone]  Physical Therapy Treatment  Patient Details  Name: Virginia Luna MRN: [id number] Date of Birth: Jan 04, 1961 Referring Provider (PT): Dr Eunice Blase    Encounter Date: 09/09/2019  PT End of Session - 09/09/19 0825    Visit Number  29    Number of Visits  33    Date for PT Re-Evaluation  10/08/19    Authorization Type  third party payer    PT Start Time  0800    PT Stop Time  0843    PT Time Calculation (min)  43 min    Activity Tolerance  Patient tolerated treatment well    Behavior During Therapy  [facility] for tasks assessed/performed       Past Medical History:  Diagnosis Date  . Allergies   . Anemia   . Arthritis   . Asthma   . Back pain   . Chronic pain   . Diabetes ([place])   . Diabetes mellitus without complication ([place])   . Edema, lower extremity   . Fibromyalgia   . High blood pressure   . History of stomach ulcers   . IBS (irritable bowel syndrome)   . Joint pain   . Sleep apnea    did use cpap-lost 25lb-says she does not need it  . Wears contact lenses     Past Surgical History:  Procedure Laterality Date  . BREAST BIOPSY    . BREAST EXCISIONAL BIOPSY    . BREAST LUMPECTOMY WITH RADIOACTIVE SEED LOCALIZATION Bilateral 11/24/2014   Procedure: BILATERAL BREAST LUMPECTOMY WITH RADIOACTIVE SEED LOCALIZATION;  Surgeon: [name], MD;  Location: [place];  Service: General;  Laterality: Bilateral;  . CHOLECYSTECTOMY    . COLONOSCOPY    . DILATION AND CURETTAGE OF UTERUS    . ESOPHAGOGASTRODUODENOSCOPY (EGD) WITH PROPOFOL N/A 07/23/2016   Procedure: ESOPHAGOGASTRODUODENOSCOPY (EGD) WITH PROPOFOL;  Surgeon: Laurence Spates, MD;  [facility];  Service: Endoscopy;  Laterality: N/A;  . ESOPHAGOGASTRODUODENOSCOPY (EGD) WITH PROPOFOL N/A 06/11/2018   Procedure: ESOPHAGOGASTRODUODENOSCOPY (EGD)  WITH PROPOFOL;  Surgeon: Laurence Spates, MD;  Location: [facility];  Service: Endoscopy;  Laterality: N/A;  . LUMBAR LAMINECTOMY  2010  . UMBILICAL HERNIA REPAIR     age 30  . UPPER GI ENDOSCOPY      There were no vitals filed for this visit.  Subjective Assessment - 09/09/19 0809    Subjective  Patient reports she was a little sore after the last visit. She has had some numbness in his left leg.    Limitations  Standing    How long can you sit comfortably?  Sitting for too long stiffens her knees    How long can you stand comfortably?  varies how long she can stand    How long can you walk comfortably?  trying to walk 5 min 3 times a day    Diagnostic tests  X-ray but results arent in the computer; Per patient bone spurs and arthritis.    Currently in Pain?  Yes    Pain Score  5     Pain Location  Knee    Pain Orientation  Right    Pain Descriptors / Indicators  Aching    Pain Type  Chronic pain    Pain Onset  More than a month ago    Pain Frequency  Constant    Aggravating Factors  standing and walking    Pain Relieving Factors  rest    Effect of Pain on Daily Activities  difficulty walking    Multiple Pain Sites  No    Pain Score  3    Pain Location  Knee    Pain Orientation  Right    Pain Descriptors / Indicators  Aching    Pain Type  Chronic pain    Pain Onset  More than a month ago    Pain Frequency  Constant    Aggravating Factors   standing and walking    Pain Relieving Factors  rest    Effect of Pain on Daily Activities  difficulty perfroming ADL's         [facility] PT Assessment - 09/10/19 0001      Strength   Right Hip Flexion  4+/5    Right Hip ABduction  4+/5    Right Hip ADduction  4+/5    Left Hip Flexion  4+/5    Left Hip ABduction  4+/5    Left Hip ADduction  4+/5    Right Knee Flexion  4+/5    Right Knee Extension  4+/5    Left Knee Flexion  4+/5    Left Knee Extension  4+/5      Ambulation/Gait   Gait Comments  improved single leg stance on  the right; still has decreased hip flexion                    [facility] Adult PT Treatment/Exercise - 09/10/19 0001      Knee/Hip Exercises: Stretches   Active Hamstring Stretch  2 reps;20 seconds    Active Hamstring Stretch Limitations  seated hamstring stretch 3x20 sec hold       Knee/Hip Exercises: Standing   Heel Raises Limitations  x20     Knee Flexion Limitations  standing march x15 each leg       Knee/Hip Exercises: Seated   Heel Slides Limitations  hamstirng curl red x20     Clamshell with TheraBand  Green   x20      Knee/Hip Exercises: Supine   Quad Sets Limitations  x15 bilateral 5 sec hold     Short Arc Quad Sets Limitations  weight held on right side 2x10 1llb     Straight Leg Raises Limitations  2x10 right x10 left       Manual Therapy   Manual therapy comments  reviewed self soft tissue mobilization with tiger tail     Joint Mobilization  PA and AP glides Grade II ands III with distraction     Soft tissue mobilization  to left anterior knee / posterior knee     Passive ROM  into knee extension              PT Education - 09/09/19 0825    Education Details  HEP and symptom mangement    Person(s) Educated  Patient    Methods  Explanation;Demonstration;Tactile cues;Verbal cues    Comprehension  Verbalized understanding;Returned demonstration;Verbal cues required;Tactile cues required       PT Short Term Goals - 09/02/19 1322      PT SHORT TERM GOAL #1   Title  Patient will be indepenent with basic HEP    Baseline  working on basic HEP    Time  3    Period  Weeks    Status  Achieved    Target Date  04/10/19      PT  SHORT TERM GOAL #2   Title  Patient will demonstrate a 50% improvement in patella movement    Baseline  full movement bilateral    Time  3    Period  Weeks    Status  Achieved    Target Date  04/10/19      PT SHORT TERM GOAL #3   Title  Patient will extend bilateral LE without increased pain    Baseline  minor pain with edn  rnage extension    Time  3    Period  Weeks    Status  Achieved    Target Date  04/10/19        PT Long Term Goals - 08/27/19 0803      PT LONG TERM GOAL #1   Title  Patient will transfer sit to stand independently without use of hands and without increased pain    Baseline  able to transfer without pressing up    Time  6    Status  Achieved      PT LONG TERM GOAL #2   Title  Patient will stand for 10 minutes without increased pain in order to perfrom ADL's    Baseline  can stand with minor pain    Time  6    Period  Weeks    Status  Achieved      PT LONG TERM GOAL #3   Title  Patient will ambualte 150' without increased pain with LRAD in order to improve ability to go to appointments    Baseline  able to walk without device    Time  6    Period  Weeks    Status  Achieved      PT LONG TERM GOAL #4   Title  Patient            Plan - 09/09/19 0826    Clinical Impression Statement  Patient had some shooting pain in her left leg today. She was still able to complete exercises. She had improved pain with distraction and mobilization. She has had some numbness in her knee. She reports a histroy of back issues. She was advised to keep an eye on the numbness.    Personal Factors and Comorbidities  Comorbidity 1;Comorbidity 2;Comorbidity 3+    Comorbidities  fibromaylgia, anxiety, obesity    Examination-Activity Limitations  Transfers;Locomotion Level;Sit;Sleep;Squat;Stairs;Stand    Examination-Participation Restrictions  Community Activity;Cleaning;Laundry    Stability/Clinical Decision Making  Evolving/Moderate complexity    Clinical Decision Making  Moderate    Rehab Potential  Good    PT Frequency  1x / week    PT Duration  6 weeks    PT Treatment/Interventions  ADLs/Self Care Home Management;Cryotherapy;Electrical Stimulation;Moist Heat;Traction;Ultrasound;DME Instruction;Stair training;Functional mobility training;Therapeutic activities;Therapeutic exercise;Gait  training;Neuromuscular re-education;Patient/family education;Passive range of motion;Manual techniques;Taping;Spinal Manipulations;Joint Manipulations    PT Next Visit Plan  continue with progression into gym activity    PT Home Exercise Plan  quad set, seated clam with band, self patella mobs, supine heel slides, bridge, SAQ, LAQ sink squat, mini lunge forward, side and backward with UE support. hip hinge and deadlift on raised step    Consulted and Agree with Plan of Care  Patient       Patient will benefit from skilled therapeutic intervention in order to improve the following deficits and impairments:  Abnormal gait, Decreased range of motion, Difficulty walking, Obesity, Decreased endurance, Decreased mobility, Decreased strength, Improper body mechanics, Decreased activity tolerance, Pain  Visit Diagnosis: Chronic pain  of left knee  Difficulty in walking, not elsewhere classified  Chronic pain of right knee  Muscle weakness (generalized)     Problem List There are no problems to display for this patient.   Carney Living 09/10/2019, 8:57 AM  [facility] [address] [place], [place], [address] Phone: [phone]   Fax:  [phone]  Name: Virginia Luna MRN: [id number] Date of Birth: [birth year]

## 2019-09-13 ENCOUNTER — Encounter (INDEPENDENT_AMBULATORY_CARE_PROVIDER_SITE_OTHER): Payer: Self-pay | Admitting: Family Medicine

## 2019-09-16 ENCOUNTER — Encounter: Payer: Self-pay | Admitting: Physical Therapy

## 2019-09-16 ENCOUNTER — Ambulatory Visit: Payer: PPO | Admitting: Physical Therapy

## 2019-09-16 ENCOUNTER — Other Ambulatory Visit: Payer: Self-pay

## 2019-09-16 DIAGNOSIS — G8929 Other chronic pain: Secondary | ICD-10-CM

## 2019-09-16 DIAGNOSIS — J3081 Allergic rhinitis due to animal (cat) (dog) hair and dander: Secondary | ICD-10-CM | POA: Diagnosis not present

## 2019-09-16 DIAGNOSIS — M25562 Pain in left knee: Secondary | ICD-10-CM | POA: Diagnosis not present

## 2019-09-16 DIAGNOSIS — J3089 Other allergic rhinitis: Secondary | ICD-10-CM | POA: Diagnosis not present

## 2019-09-16 DIAGNOSIS — R262 Difficulty in walking, not elsewhere classified: Secondary | ICD-10-CM

## 2019-09-16 DIAGNOSIS — J301 Allergic rhinitis due to pollen: Secondary | ICD-10-CM | POA: Diagnosis not present

## 2019-09-16 DIAGNOSIS — M25561 Pain in right knee: Secondary | ICD-10-CM

## 2019-09-16 DIAGNOSIS — M6281 Muscle weakness (generalized): Secondary | ICD-10-CM

## 2019-09-16 NOTE — Therapy (Signed)
[place], [place], [address] Phone: [phone]   Fax:  [phone]  Physical Therapy Treatment  Patient Details  Name: Virginia Luna MRN: [id number] Date of Birth: 1961-02-19 Referring Provider (PT): Dr Eunice Blase    Encounter Date: 09/16/2019  PT End of Session - 09/16/19 0808    Visit Number  30    Number of Visits  33    Date for PT Re-Evaluation  10/08/19    Authorization Type  third party payer    PT Start Time  0800    PT Stop Time  0842    PT Time Calculation (min)  42 min    Activity Tolerance  Patient tolerated treatment well    Behavior During Therapy  [facility] for tasks assessed/performed       Past Medical History:  Diagnosis Date  . Allergies   . Anemia   . Arthritis   . Asthma   . Back pain   . Chronic pain   . Diabetes ([place])   . Diabetes mellitus without complication ([place])   . Edema, lower extremity   . Fibromyalgia   . High blood pressure   . History of stomach ulcers   . IBS (irritable bowel syndrome)   . Joint pain   . Sleep apnea    did use cpap-lost 25lb-says she does not need it  . Wears contact lenses     Past Surgical History:  Procedure Laterality Date  . BREAST BIOPSY    . BREAST EXCISIONAL BIOPSY    . BREAST LUMPECTOMY WITH RADIOACTIVE SEED LOCALIZATION Bilateral 11/24/2014   Procedure: BILATERAL BREAST LUMPECTOMY WITH RADIOACTIVE SEED LOCALIZATION;  Surgeon: [name], MD;  Location: [place];  Service: General;  Laterality: Bilateral;  . CHOLECYSTECTOMY    . COLONOSCOPY    . DILATION AND CURETTAGE OF UTERUS    . ESOPHAGOGASTRODUODENOSCOPY (EGD) WITH PROPOFOL N/A 07/23/2016   Procedure: ESOPHAGOGASTRODUODENOSCOPY (EGD) WITH PROPOFOL;  Surgeon: Laurence Spates, MD;  [facility];  Service: Endoscopy;  Laterality: N/A;  . ESOPHAGOGASTRODUODENOSCOPY (EGD) WITH PROPOFOL N/A 06/11/2018   Procedure: ESOPHAGOGASTRODUODENOSCOPY (EGD)  WITH PROPOFOL;  Surgeon: Laurence Spates, MD;  [facility];  Service: Endoscopy;  Laterality: N/A;  . LUMBAR LAMINECTOMY  2010  . UMBILICAL HERNIA REPAIR     age 57  . UPPER GI ENDOSCOPY      There were no vitals filed for this visit.  Subjective Assessment - 09/16/19 0805    Subjective  Patient reports her knees are doing a little better. Her right one is hurting her a bit but overll it is not too bad. She has had the sensation that she is wet in the right leg over the apst week. She was advised that could come from her knee and to talk to her MD next time she sees him.    Limitations  Standing    How long can you sit comfortably?  Sitting for too long stiffens her knees    How long can you stand comfortably?  varies how long she can stand    How long can you walk comfortably?  trying to walk 5 min 3 times a day    Diagnostic tests  X-ray but results arent in the computer; Per patient bone spurs and arthritis.    Currently in Pain?  Yes    Pain Score  5     Pain Location  Knee    Pain Orientation  Left;Right  Pain Descriptors / Indicators  Aching    Pain Type  Chronic pain    Pain Onset  More than a month ago    Pain Frequency  Constant    Aggravating Factors   standing and walking    Pain Relieving Factors  rest    Effect of Pain on Daily Activities  difficulty walking                       [facility] Adult PT Treatment/Exercise - 09/16/19 0001      Self-Care   Other Self-Care Comments   reviewed how to apply abdominal breathing to all exercises to help stablizae her back       Knee/Hip Exercises: Stretches   Active Hamstring Stretch  2 reps;20 seconds    Active Hamstring Stretch Limitations  seated hamstring stretch 3x20 sec hold       Knee/Hip Exercises: Aerobic   Nustep  5 min L4       Knee/Hip Exercises: Standing   Heel Raises Limitations  x20     Knee Flexion Limitations  standing march x15 each leg     Lateral Step Up  10 reps;Both;Hand Hold:  2;2 sets;Step Height: 4"    Forward Step Up  10 reps;Hand Hold: 2;Both;Step Height: 6";2 sets      Knee/Hip Exercises: Seated   Heel Slides Limitations  hamstirng curl gren x20     Clamshell with TheraBand  Green   x20      Knee/Hip Exercises: Supine   Quad Sets Limitations  x15 5 sec hold     Short Arc Quad Sets Limitations  2x10 2lb     Bridges Limitations  2x10    Straight Leg Raises Limitations  2x10 right x10 left       Manual Therapy   Manual therapy comments  reviewed self soft tissue mobilization with tiger tail     Joint Mobilization  PA and AP glides Grade II ands III with distraction     Soft tissue mobilization  to left anterior knee / posterior knee     Passive ROM  into knee extension              PT Education - 09/16/19 0808    Education Details  reviewed progression of activity    Person(s) Educated  Patient    Methods  Explanation;Demonstration;Tactile cues;Verbal cues    Comprehension  Verbalized understanding;Returned demonstration;Verbal cues required;Tactile cues required       PT Short Term Goals - 09/02/19 1322      PT SHORT TERM GOAL #1   Title  Patient will be indepenent with basic HEP    Baseline  working on basic HEP    Time  3    Period  Weeks    Status  Achieved    Target Date  04/10/19      PT SHORT TERM GOAL #2   Title  Patient will demonstrate a 50% improvement in patella movement    Baseline  full movement bilateral    Time  3    Period  Weeks    Status  Achieved    Target Date  04/10/19      PT SHORT TERM GOAL #3   Title  Patient will extend bilateral LE without increased pain    Baseline  minor pain with edn rnage extension    Time  3    Period  Weeks    Status  Achieved  Target Date  04/10/19        PT Long Term Goals - 08/27/19 0803      PT LONG TERM GOAL #1   Title  Patient will transfer sit to stand independently without use of hands and without increased pain    Baseline  able to transfer without pressing  up    Time  6    Status  Achieved      PT LONG TERM GOAL #2   Title  Patient will stand for 10 minutes without increased pain in order to perfrom ADL's    Baseline  can stand with minor pain    Time  6    Period  Weeks    Status  Achieved      PT LONG TERM GOAL #3   Title  Patient will ambualte 150' without increased pain with LRAD in order to improve ability to go to appointments    Baseline  able to walk without device    Time  6    Period  Weeks    Status  Achieved      PT LONG TERM GOAL #4   Title  Patient            Plan - 09/16/19 0839    Clinical Impression Statement  Patient had less pain with ther-ex today. She was able to tolerate increased weight and increased bands without increased pain. She has been able to maintain good patellar movement. Therapy wil continue to advance towards home program.    Comorbidities  fibromaylgia, anxiety, obesity    Examination-Activity Limitations  Transfers;Locomotion Level;Sit;Sleep;Squat;Stairs;Stand    Examination-Participation Restrictions  Community Activity;Cleaning;Laundry    Stability/Clinical Decision Making  Evolving/Moderate complexity    Rehab Potential  Good    PT Frequency  1x / week    PT Duration  6 weeks    PT Treatment/Interventions  ADLs/Self Care Home Management;Cryotherapy;Electrical Stimulation;Moist Heat;Traction;Ultrasound;DME Instruction;Stair training;Functional mobility training;Therapeutic activities;Therapeutic exercise;Gait training;Neuromuscular re-education;Patient/family education;Passive range of motion;Manual techniques;Taping;Spinal Manipulations;Joint Manipulations    PT Next Visit Plan  continue with progression into gym activity; review abdominal breathing    PT Home Exercise Plan  quad set, seated clam with band, self patella mobs, supine heel slides, bridge, SAQ, LAQ sink squat, mini lunge forward, side and backward with UE support. hip hinge and deadlift on raised step    Consulted and Agree  with Plan of Care  Patient       Patient will benefit from skilled therapeutic intervention in order to improve the following deficits and impairments:  Abnormal gait, Decreased range of motion, Difficulty walking, Obesity, Decreased endurance, Decreased mobility, Decreased strength, Improper body mechanics, Decreased activity tolerance, Pain  Visit Diagnosis: Chronic pain of left knee  Difficulty in walking, not elsewhere classified  Chronic pain of right knee  Muscle weakness (generalized)     Problem List There are no problems to display for this patient.   Carney Living PT DPT  09/16/2019, 2:58 PM  [facility] [address] [place], [place], [address] Phone: [phone]   Fax:  [phone]  Name: Virginia Luna MRN: [id number] Date of Birth: Dec 31, 1960

## 2019-09-23 ENCOUNTER — Other Ambulatory Visit: Payer: Self-pay

## 2019-09-23 ENCOUNTER — Encounter: Payer: Self-pay | Admitting: Physical Therapy

## 2019-09-23 ENCOUNTER — Ambulatory Visit: Payer: PPO | Attending: Family Medicine | Admitting: Physical Therapy

## 2019-09-23 DIAGNOSIS — G8929 Other chronic pain: Secondary | ICD-10-CM | POA: Insufficient documentation

## 2019-09-23 DIAGNOSIS — M25562 Pain in left knee: Secondary | ICD-10-CM | POA: Diagnosis not present

## 2019-09-23 DIAGNOSIS — J3089 Other allergic rhinitis: Secondary | ICD-10-CM | POA: Diagnosis not present

## 2019-09-23 DIAGNOSIS — M6281 Muscle weakness (generalized): Secondary | ICD-10-CM | POA: Diagnosis not present

## 2019-09-23 DIAGNOSIS — R262 Difficulty in walking, not elsewhere classified: Secondary | ICD-10-CM | POA: Diagnosis not present

## 2019-09-23 DIAGNOSIS — J3081 Allergic rhinitis due to animal (cat) (dog) hair and dander: Secondary | ICD-10-CM | POA: Diagnosis not present

## 2019-09-23 DIAGNOSIS — M25561 Pain in right knee: Secondary | ICD-10-CM | POA: Insufficient documentation

## 2019-09-23 DIAGNOSIS — J301 Allergic rhinitis due to pollen: Secondary | ICD-10-CM | POA: Diagnosis not present

## 2019-09-23 NOTE — Therapy (Signed)
[place], [place], [address] Phone: [phone]   Fax:  [phone]  Physical Therapy Treatment  Patient Details  Name: Virginia Luna MRN: [id number] Date of Birth: 1961/07/18 Referring Provider (PT): Dr Eunice Blase    Encounter Date: 09/23/2019  PT End of Session - 09/23/19 1340    Visit Number  31    Number of Visits  33    Date for PT Re-Evaluation  10/08/19    Authorization Type  third party payer    PT Start Time  0800    PT Stop Time  0840   Patient requested to leave 5 min early   PT Time Calculation (min)  40 min    Activity Tolerance  Patient tolerated treatment well    Behavior During Therapy  [facility] for tasks assessed/performed       Past Medical History:  Diagnosis Date  . Allergies   . Anemia   . Arthritis   . Asthma   . Back pain   . Chronic pain   . Diabetes ([place])   . Diabetes mellitus without complication ([place])   . Edema, lower extremity   . Fibromyalgia   . High blood pressure   . History of stomach ulcers   . IBS (irritable bowel syndrome)   . Joint pain   . Sleep apnea    did use cpap-lost 25lb-says she does not need it  . Wears contact lenses     Past Surgical History:  Procedure Laterality Date  . BREAST BIOPSY    . BREAST EXCISIONAL BIOPSY    . BREAST LUMPECTOMY WITH RADIOACTIVE SEED LOCALIZATION Bilateral 11/24/2014   Procedure: BILATERAL BREAST LUMPECTOMY WITH RADIOACTIVE SEED LOCALIZATION;  Surgeon: [name], MD;  Location: [place];  Service: General;  Laterality: Bilateral;  . CHOLECYSTECTOMY    . COLONOSCOPY    . DILATION AND CURETTAGE OF UTERUS    . ESOPHAGOGASTRODUODENOSCOPY (EGD) WITH PROPOFOL N/A 07/23/2016   Procedure: ESOPHAGOGASTRODUODENOSCOPY (EGD) WITH PROPOFOL;  Surgeon: Laurence Spates, MD;  Location: [facility];  Service: Endoscopy;  Laterality: N/A;  . ESOPHAGOGASTRODUODENOSCOPY (EGD) WITH PROPOFOL N/A 06/11/2018   Procedure: ESOPHAGOGASTRODUODENOSCOPY (EGD) WITH PROPOFOL;  Surgeon: Laurence Spates, MD;  Location: [facility];  Service: Endoscopy;  Laterality: N/A;  . LUMBAR LAMINECTOMY  2010  . UMBILICAL HERNIA REPAIR     age 29  . UPPER GI ENDOSCOPY      There were no vitals filed for this visit.  Subjective Assessment - 09/23/19 0829    Subjective  Patient reports her knees have been doing pretty well. Her left one is in its usual state. she is sore but working through it.    Limitations  Standing    How long can you sit comfortably?  Sitting for too long stiffens her knees    How long can you stand comfortably?  varies how long she can stand    How long can you walk comfortably?  trying to walk 5 min 3 times a day    Diagnostic tests  X-ray but results arent in the computer; Per patient bone spurs and arthritis.    Currently in Pain?  Yes    Pain Score  5     Pain Location  Knee    Pain Orientation  Right;Left    Pain Descriptors / Indicators  Aching    Pain Type  Chronic pain    Pain Radiating Towards  standing and walking  Pain Onset  More than a month ago    Pain Frequency  Constant    Aggravating Factors   standing and walking    Pain Relieving Factors  rest    Effect of Pain on Daily Activities  difficulty walking    Multiple Pain Sites  No                       [facility] Adult PT Treatment/Exercise - 09/23/19 0001      Self-Care   Other Self-Care Comments   reviewed how to apply abdominal breathing to all exercises to help stablizae her back       Knee/Hip Exercises: Stretches   Active Hamstring Stretch  2 reps;20 seconds    Active Hamstring Stretch Limitations  seated hamstring stretch 3x20 sec hold       Knee/Hip Exercises: Aerobic   Nustep  5 min L4       Knee/Hip Exercises: Standing   Heel Raises Limitations  x20     Knee Flexion Limitations  standing march x15 each leg     Lateral Step Up  --    Forward Step Up  --    Other Standing Knee Exercises   dead lift 15lb 2x10; kettle bell swing mod cuing 2x10;       Knee/Hip Exercises: Seated   Heel Slides Limitations  hamstirng curl gren x20     Clamshell with TheraBand  Green   x20      Knee/Hip Exercises: Supine   Quad Sets Limitations  x15 5 sec hold     Short Arc Quad Sets Limitations  2x10 10lb     Bridges Limitations  2x10    Straight Leg Raises Limitations  2x10 right x10 left       Manual Therapy   Manual therapy comments  reviewed self soft tissue mobilization with tiger tail     Joint Mobilization  PA and AP glides Grade II ands III with distraction     Soft tissue mobilization  to left anterior knee / posterior knee     Passive ROM  into knee extension                PT Short Term Goals - 09/02/19 1322      PT SHORT TERM GOAL #1   Title  Patient will be indepenent with basic HEP    Baseline  working on basic HEP    Time  3    Period  Weeks    Status  Achieved    Target Date  04/10/19      PT SHORT TERM GOAL #2   Title  Patient will demonstrate a 50% improvement in patella movement    Baseline  full movement bilateral    Time  3    Period  Weeks    Status  Achieved    Target Date  04/10/19      PT SHORT TERM GOAL #3   Title  Patient will extend bilateral LE without increased pain    Baseline  minor pain with edn rnage extension    Time  3    Period  Weeks    Status  Achieved    Target Date  04/10/19        PT Long Term Goals - 08/27/19 0803      PT LONG TERM GOAL #1   Title  Patient will transfer sit to stand independently without use of hands and without increased  pain    Baseline  able to transfer without pressing up    Time  6    Status  Achieved      PT LONG TERM GOAL #2   Title  Patient will stand for 10 minutes without increased pain in order to perfrom ADL's    Baseline  can stand with minor pain    Time  6    Period  Weeks    Status  Achieved      PT LONG TERM GOAL #3   Title  Patient will ambualte 150' without increased pain  with LRAD in order to improve ability to go to appointments    Baseline  able to walk without device    Time  6    Period  Weeks    Status  Achieved      PT LONG TERM GOAL #4   Title  Patient            Plan - 09/23/19 1340    Clinical Impression Statement  Patient walked on lifting objects and engaging hips with kettle bell today. She tolerated well. She reported no increasein pain. therapy will continue to advanace lifting exercises over the next few visits.    Comorbidities  fibromaylgia, anxiety, obesity    Examination-Activity Limitations  Transfers;Locomotion Level;Sit;Sleep;Squat;Stairs;Stand    Examination-Participation Restrictions  Community Activity;Cleaning;Laundry    Stability/Clinical Decision Making  Evolving/Moderate complexity    Clinical Decision Making  Moderate    Rehab Potential  Good    PT Frequency  1x / week    PT Duration  6 weeks    PT Treatment/Interventions  ADLs/Self Care Home Management;Cryotherapy;Electrical Stimulation;Moist Heat;Traction;Ultrasound;DME Instruction;Stair training;Functional mobility training;Therapeutic activities;Therapeutic exercise;Gait training;Neuromuscular re-education;Patient/family education;Passive range of motion;Manual techniques;Taping;Spinal Manipulations;Joint Manipulations    PT Next Visit Plan  continue with progression into gym activity; review abdominal breathing    PT Home Exercise Plan  quad set, seated clam with band, self patella mobs, supine heel slides, bridge, SAQ, LAQ sink squat, mini lunge forward, side and backward with UE support. hip hinge and deadlift on raised step    Consulted and Agree with Plan of Care  Patient       Patient will benefit from skilled therapeutic intervention in order to improve the following deficits and impairments:  Abnormal gait, Decreased range of motion, Difficulty walking, Obesity, Decreased endurance, Decreased mobility, Decreased strength, Improper body mechanics, Decreased  activity tolerance, Pain  Visit Diagnosis: Chronic pain of left knee  Difficulty in walking, not elsewhere classified  Chronic pain of right knee  Muscle weakness (generalized)     Problem List There are no problems to display for this patient.   Carney Living PT DPT  09/23/2019, 1:43 PM  [facility] [address] [place], [place], [address] Phone: [phone]   Fax:  [phone]  Name: Virginia Luna MRN: [id number] Date of Birth: [birth year]

## 2019-09-28 ENCOUNTER — Ambulatory Visit (INDEPENDENT_AMBULATORY_CARE_PROVIDER_SITE_OTHER): Payer: PPO | Admitting: Family Medicine

## 2019-09-28 ENCOUNTER — Encounter (INDEPENDENT_AMBULATORY_CARE_PROVIDER_SITE_OTHER): Payer: Self-pay | Admitting: Family Medicine

## 2019-09-28 ENCOUNTER — Other Ambulatory Visit: Payer: Self-pay

## 2019-09-28 VITALS — BP 147/87 | HR 101 | Temp 97.9°F | Ht 61.0 in | Wt 283.0 lb

## 2019-09-28 DIAGNOSIS — M170 Bilateral primary osteoarthritis of knee: Secondary | ICD-10-CM

## 2019-09-28 DIAGNOSIS — E119 Type 2 diabetes mellitus without complications: Secondary | ICD-10-CM

## 2019-09-28 DIAGNOSIS — E7849 Other hyperlipidemia: Secondary | ICD-10-CM | POA: Diagnosis not present

## 2019-09-28 DIAGNOSIS — E559 Vitamin D deficiency, unspecified: Secondary | ICD-10-CM | POA: Diagnosis not present

## 2019-09-28 DIAGNOSIS — Z6841 Body Mass Index (BMI) 40.0 and over, adult: Secondary | ICD-10-CM | POA: Diagnosis not present

## 2019-09-28 DIAGNOSIS — R748 Abnormal levels of other serum enzymes: Secondary | ICD-10-CM | POA: Diagnosis not present

## 2019-09-28 DIAGNOSIS — Z9189 Other specified personal risk factors, not elsewhere classified: Secondary | ICD-10-CM

## 2019-09-28 DIAGNOSIS — I10 Essential (primary) hypertension: Secondary | ICD-10-CM

## 2019-09-28 NOTE — Progress Notes (Signed)
Chief Complaint:   OBESITY Virginia Luna is here to discuss her progress with her obesity treatment plan along with follow-up of her obesity related diagnoses. Virginia Luna is on the Category 2 Plan and states she is following her eating plan approximately 70% of the time. Virginia Luna states she is doing chair exercises for 30 minutes 3 times per week.  Today's visit was #: 3 Starting weight: 288 lbs Starting date: 08/27/2019 Today's weight: 283 lbs Today's date: 09/28/2019 Total lbs lost to date: 5 lbs Total lbs lost since last in-office visit: 2 lbs  Interim History: Virginia Luna says she went off the plan somewhat last week.  She has decreased her exercise due to knee pain/fibromyalgia, which has gotten worse with the cold weather.  She is ready to get back to it.  Subjective:   1. Type 2 diabetes mellitus without complication, without long-term current use of insulin (HCC) Medications reviewed. She is taking metformin 500 mg at night.  Lab Results  Component Value Date   CREATININE 0.67 08/27/2019   Lab Results  Component Value Date   INSULIN 15.9 08/27/2019   2. Essential hypertension Review: taking medications as instructed, no medication side effects noted, no chest pain on exertion, no dyspnea on exertion, no swelling of ankles.  Blood pressure was elevated today.  BP Readings from Last 3 Encounters:  09/28/19 (!) 147/87  09/10/19 136/83  08/27/19 136/88   3. Other hyperlipidemia Anjalina has hyperlipidemia and has been trying to improve her cholesterol levels with intensive lifestyle modification including a low saturated fat diet, exercise and weight loss. She denies any chest pain, claudication or myalgias.    Lab Results  Component Value Date   ALT 12 08/27/2019   AST 9 08/27/2019   ALKPHOS 157 (H) 08/27/2019   BILITOT 0.4 08/27/2019   4. Vitamin D deficiency Virginia Luna's Vitamin D level was 11.2 on 08/27/2019. She is currently taking vit D. She denies nausea, vomiting or muscle  weakness.  5. Osteoarthritis of both knees, unspecified osteoarthritis type Virginia Luna is followed by Orthopedics and is getting PT.  6. Elevated alkaline phosphatase level Virginia Luna has had her gallbladder removed.  She does complain of some RUQ pain.  Lab Results  Component Value Date   ALKPHOS 157 (H) 08/27/2019   7. At risk for heart disease Virginia Luna is at a higher than average risk for cardiovascular disease due to obesity. Reviewed: no chest pain on exertion, no dyspnea on exertion, and no swelling of ankles.  Assessment/Plan:   1. Type 2 diabetes mellitus without complication, without long-term current use of insulin (HCC) Good blood sugar control is important to decrease the likelihood of diabetic complications such as nephropathy, neuropathy, limb loss, blindness, coronary artery disease, and death. Intensive lifestyle modification including diet, exercise and weight loss are the first line of treatment for diabetes.   2. Essential hypertension Virginia Luna is working on healthy weight loss and exercise to improve blood pressure control. We will watch for signs of hypotension as she continues her lifestyle modifications.  3. Other hyperlipidemia Cardiovascular risk and specific lipid/LDL goals reviewed.  We discussed several lifestyle modifications today and Virginia Luna will continue to work on diet, exercise and weight loss efforts. Orders and follow up as documented in patient record.   Counseling Intensive lifestyle modifications are the first line treatment for this issue. . Dietary changes: Increase soluble fiber. Decrease simple carbohydrates. . Exercise changes: Moderate to vigorous-intensity aerobic activity 150 minutes per week if tolerated. . Lipid-lowering medications: see  documented in medical record.  4. Vitamin D deficiency Low Vitamin D level contributes to fatigue and are associated with obesity, breast, and colon cancer. She agrees to continue to take prescription Vitamin  D @50 ,000 IU every week and will follow-up for routine testing of Vitamin D, at least 2-3 times per year to avoid over-replacement.  5. Osteoarthritis of both knees, unspecified osteoarthritis type Will follow because mobility and pain control are important for weight management.  6. Elevated alkaline phosphatase level Will recheck labs in 2 weeks.  7. At risk for heart disease Virginia Luna was given approximately 15 minutes of coronary artery disease prevention counseling today. She is 59 y.o. female and has risk factors for heart disease including obesity. We discussed intensive lifestyle modifications today with an emphasis on specific weight loss instructions and strategies.   Repetitive spaced learning was employed today to elicit superior memory formation and behavioral change.  8. Class 3 severe obesity with serious comorbidity and body mass index (BMI) of 50.0 to 59.9 in adult, unspecified obesity type (HCC) Anarie is currently in the action stage of change. As such, her goal is to continue with weight loss efforts. She has agreed to the Category 2 Plan.   Exercise goals: For substantial health benefits, adults should do at least 150 minutes (2 hours and 30 minutes) a week of moderate-intensity, or 75 minutes (1 hour and 15 minutes) a week of vigorous-intensity aerobic physical activity, or an equivalent combination of moderate- and vigorous-intensity aerobic activity. Aerobic activity should be performed in episodes of at least 10 minutes, and preferably, it should be spread throughout the week.  Behavioral modification strategies: increasing lean protein intake and increasing water intake.  Virginia Luna has agreed to follow-up with our clinic in 2 weeks. She was informed of the importance of frequent follow-up visits to maximize her success with intensive lifestyle modifications for her multiple health conditions.   Objective:   Blood pressure (!) 147/87, pulse (!) 101, temperature 97.9 F  (36.6 C), temperature source Oral, height 5\' 1"  (1.549 m), weight 283 lb (128.4 kg), SpO2 97 %. Body mass index is 53.47 kg/m.  General: Cooperative, alert, well developed, in no acute distress. HEENT: Conjunctivae and lids unremarkable. Cardiovascular: Regular rhythm.  Lungs: Normal work of breathing. Neurologic: No focal deficits.   Lab Results  Component Value Date   CREATININE 0.67 08/27/2019   BUN 14 08/27/2019   NA 145 (H) 08/27/2019   K 4.6 08/27/2019   CL 103 08/27/2019   CO2 26 08/27/2019   Lab Results  Component Value Date   ALT 12 08/27/2019   AST 9 08/27/2019   ALKPHOS 157 (H) 08/27/2019   BILITOT 0.4 08/27/2019   Lab Results  Component Value Date   INSULIN 15.9 08/27/2019   Lab Results  Component Value Date   TSH 1.490 08/27/2019   Lab Results  Component Value Date   WBC 8.1 08/27/2019   HGB 11.6 08/27/2019   HCT 37.0 08/27/2019   MCV 85 08/27/2019   PLT 474 (H) 08/27/2019   Lab Results  Component Value Date   IRON 45 08/27/2019   TIBC 351 08/27/2019   FERRITIN 62 08/27/2019   Attestation Statements:   Reviewed by clinician on day of visit: allergies, medications, problem list, medical history, surgical history, family history, social history, and previous encounter notes.  I, Water quality scientist, CMA, am acting as Location manager for [employer], DO.  I have reviewed the above documentation for accuracy and completeness, and I agree  with the above. Briscoe Deutscher, DO

## 2019-09-30 ENCOUNTER — Ambulatory Visit: Payer: PPO | Admitting: Physical Therapy

## 2019-09-30 ENCOUNTER — Other Ambulatory Visit: Payer: Self-pay

## 2019-09-30 DIAGNOSIS — G8929 Other chronic pain: Secondary | ICD-10-CM

## 2019-09-30 DIAGNOSIS — M25561 Pain in right knee: Secondary | ICD-10-CM

## 2019-09-30 DIAGNOSIS — R262 Difficulty in walking, not elsewhere classified: Secondary | ICD-10-CM

## 2019-09-30 DIAGNOSIS — J301 Allergic rhinitis due to pollen: Secondary | ICD-10-CM | POA: Diagnosis not present

## 2019-09-30 DIAGNOSIS — M6281 Muscle weakness (generalized): Secondary | ICD-10-CM

## 2019-09-30 DIAGNOSIS — J3089 Other allergic rhinitis: Secondary | ICD-10-CM | POA: Diagnosis not present

## 2019-09-30 DIAGNOSIS — J3081 Allergic rhinitis due to animal (cat) (dog) hair and dander: Secondary | ICD-10-CM | POA: Diagnosis not present

## 2019-09-30 DIAGNOSIS — M25562 Pain in left knee: Secondary | ICD-10-CM | POA: Diagnosis not present

## 2019-10-01 ENCOUNTER — Encounter: Payer: Self-pay | Admitting: Physical Therapy

## 2019-10-01 NOTE — Therapy (Signed)
[place], [place], [address] Phone: [phone]   Fax:  [phone]  Physical Therapy Treatment  Patient Details  Name: Virginia Luna MRN: [id number] Date of Birth: 01-28-1961 Referring Provider (PT): Dr Eunice Blase    Encounter Date: 09/30/2019  PT End of Session - 09/30/19 0813    Visit Number  32    Number of Visits  33    Date for PT Re-Evaluation  10/08/19    Authorization Type  third party payer    PT Start Time  0804    PT Stop Time  0845    PT Time Calculation (min)  41 min    Activity Tolerance  Patient tolerated treatment well    Behavior During Therapy  [facility] for tasks assessed/performed       Past Medical History:  Diagnosis Date  . Allergies   . Anemia   . Arthritis   . Asthma   . Back pain   . Chronic pain   . Diabetes ([place])   . Diabetes mellitus without complication ([place])   . Edema, lower extremity   . Fibromyalgia   . High blood pressure   . History of stomach ulcers   . IBS (irritable bowel syndrome)   . Joint pain   . Sleep apnea    did use cpap-lost 25lb-says she does not need it  . Wears contact lenses     Past Surgical History:  Procedure Laterality Date  . BREAST BIOPSY    . BREAST EXCISIONAL BIOPSY    . BREAST LUMPECTOMY WITH RADIOACTIVE SEED LOCALIZATION Bilateral 11/24/2014   Procedure: BILATERAL BREAST LUMPECTOMY WITH RADIOACTIVE SEED LOCALIZATION;  Surgeon: [name], MD;  Location: [place];  Service: General;  Laterality: Bilateral;  . CHOLECYSTECTOMY    . COLONOSCOPY    . DILATION AND CURETTAGE OF UTERUS    . ESOPHAGOGASTRODUODENOSCOPY (EGD) WITH PROPOFOL N/A 07/23/2016   Procedure: ESOPHAGOGASTRODUODENOSCOPY (EGD) WITH PROPOFOL;  Surgeon: Laurence Spates, MD;  Location[facility];  Service: Endoscopy;  Laterality: N/A;  . ESOPHAGOGASTRODUODENOSCOPY (EGD) WITH PROPOFOL N/A 06/11/2018   Procedure: ESOPHAGOGASTRODUODENOSCOPY (EGD)  WITH PROPOFOL;  Surgeon: Laurence Spates, MD;  Location: [facility];  Service: Endoscopy;  Laterality: N/A;  . LUMBAR LAMINECTOMY  2010  . UMBILICAL HERNIA REPAIR     age 49  . UPPER GI ENDOSCOPY      There were no vitals filed for this visit.  Subjective Assessment - 09/30/19 0811    Subjective  Patient had some swelling in the back of her left knee. She reports the pain has been about the same.    Limitations  Standing    How long can you sit comfortably?  Sitting for too long stiffens her knees    How long can you stand comfortably?  varies how long she can stand    How long can you walk comfortably?  trying to walk 5 min 3 times a day    Diagnostic tests  X-ray but results arent in the computer; Per patient bone spurs and arthritis.    Currently in Pain?  Yes    Pain Score  5     Pain Location  Knee    Pain Orientation  Right;Left    Pain Descriptors / Indicators  Aching    Pain Type  Chronic pain    Pain Onset  More than a month ago    Pain Frequency  Constant    Aggravating Factors  standing and walking    Pain Relieving Factors  rest    Effect of Pain on Daily Activities  difficulty    Pain Score  5    Pain Location  Knee    Pain Orientation  Right    Pain Descriptors / Indicators  Aching    Pain Type  Chronic pain    Pain Onset  More than a month ago    Pain Frequency  Constant    Aggravating Factors   standing and walking    Pain Relieving Factors  rest    Effect of Pain on Daily Activities  difficulty perfroming                       [facility] Adult PT Treatment/Exercise - 10/01/19 0001      Self-Care   Other Self-Care Comments   reviewed how to apply abdominal breathing to all exercises to help stablizae her back       Knee/Hip Exercises: Stretches   Active Hamstring Stretch  2 reps;20 seconds    Active Hamstring Stretch Limitations  seated hamstring stretch 3x20 sec hold       Knee/Hip Exercises: Aerobic   Nustep  5 min L4       Knee/Hip  Exercises: Standing   Heel Raises Limitations  x20     Knee Flexion Limitations  standing march x15 each leg     Lateral Step Up  10 reps;Both;Hand Hold: 2;2 sets;Step Height: 4"    Forward Step Up  10 reps;Hand Hold: 2;Both;Step Height: 6";2 sets    Other Standing Knee Exercises  dead lift 15lb 2x10; kettle bell swing mod cuing 2x10;       Knee/Hip Exercises: Seated   Heel Slides Limitations  hamstirng curl gren x20     Clamshell with TheraBand  Green   x20      Knee/Hip Exercises: Supine   Quad Sets Limitations  x15 sec hold     Short Arc Quad Sets Limitations  2x10 2lb     Bridges Limitations  2x10    Straight Leg Raises Limitations  2x10 right x10 left       Manual Therapy   Manual therapy comments  reviewed self soft tissue mobilization with tiger tail     Joint Mobilization  PA and AP glides Grade II ands III with distraction     Soft tissue mobilization  to left anterior knee / posterior knee     Passive ROM  into knee extension              PT Education - 09/30/19 0813    Education Details  HEP and symptom management    Person(s) Educated  Patient    Methods  Explanation;Demonstration;Tactile cues;Verbal cues    Comprehension  Returned demonstration;Verbal cues required;Verbalized understanding;Tactile cues required       PT Short Term Goals - 09/02/19 1322      PT SHORT TERM GOAL #1   Title  Patient will be indepenent with basic HEP    Baseline  working on basic HEP    Time  3    Period  Weeks    Status  Achieved    Target Date  04/10/19      PT SHORT TERM GOAL #2   Title  Patient will demonstrate a 50% improvement in patella movement    Baseline  full movement bilateral    Time  3    Period  Weeks  Status  Achieved    Target Date  04/10/19      PT SHORT TERM GOAL #3   Title  Patient will extend bilateral LE without increased pain    Baseline  minor pain with edn rnage extension    Time  3    Period  Weeks    Status  Achieved    Target Date   04/10/19        PT Long Term Goals - 09/30/19 1111      PT LONG TERM GOAL #1   Title  Patient will transfer sit to stand independently without use of hands and without increased pain    Baseline  able to transfer without pressing up    Time  6    Period  Weeks    Status  Achieved      PT LONG TERM GOAL #2   Title  Patient will stand for 10 minutes without increased pain in order to perfrom ADL's    Baseline  can stand with minor pain    Time  6    Period  Weeks    Status  Achieved      PT LONG TERM GOAL #3   Title  Patient will ambualte 150' without increased pain with LRAD in order to improve ability to go to appointments    Baseline  able to walk without device    Time  6    Period  Weeks    Status  Achieved            Plan - 09/30/19 1109    Clinical Impression Statement  Patient had more posterior knee swelling today but she was still able to complete all ther-ex. She perfromed gym exercises today. She is concerned that she will not be able to set the gym equimpment on her own. She was advised that most gyms have trainersa that will help her with strategies to continue working on her knees. She is nearing max potential for PT. Her pain has been consitent. Therapy has advanced her to gym activity and she is perfroming higher level ther-ex. Therapy will reviewe complete HEP with her next vists    Personal Factors and Comorbidities  Comorbidity 1;Comorbidity 2;Comorbidity 3+    Comorbidities  fibromaylgia, anxiety, obesity    Examination-Activity Limitations  Transfers;Locomotion Level;Sit;Sleep;Squat;Stairs;Stand    Examination-Participation Restrictions  Community Activity;Cleaning;Laundry    Stability/Clinical Decision Making  Evolving/Moderate complexity    Clinical Decision Making  Moderate    Rehab Potential  Good    PT Frequency  1x / week    PT Duration  6 weeks    PT Treatment/Interventions  ADLs/Self Care Home Management;Cryotherapy;Electrical  Stimulation;Moist Heat;Traction;Ultrasound;DME Instruction;Stair training;Functional mobility training;Therapeutic activities;Therapeutic exercise;Gait training;Neuromuscular re-education;Patient/family education;Passive range of motion;Manual techniques;Taping;Spinal Manipulations;Joint Manipulations    PT Next Visit Plan  continue with progression into gym activity; review abdominal breathing    PT Home Exercise Plan  quad set, seated clam with band, self patella mobs, supine heel slides, bridge, SAQ, LAQ sink squat, mini lunge forward, side and backward with UE support. hip hinge and deadlift on raised step    Consulted and Agree with Plan of Care  Patient       Patient will benefit from skilled therapeutic intervention in order to improve the following deficits and impairments:  Abnormal gait, Decreased range of motion, Difficulty walking, Obesity, Decreased endurance, Decreased mobility, Decreased strength, Improper body mechanics, Decreased activity tolerance, Pain  Visit Diagnosis: Chronic pain of left knee  Difficulty in walking, not elsewhere classified  Chronic pain of right knee  Muscle weakness (generalized)     Problem List There are no problems to display for this patient.   Carney Living PT DPT  10/01/2019, 8:57 AM  [facility] [address] [place], [place], [address] Phone: [phone]   Fax:  [phone]  Name: Virginia Luna MRN: [id number] Date of Birth: 28-Oct-1960

## 2019-10-07 ENCOUNTER — Ambulatory Visit: Payer: PPO | Admitting: Physical Therapy

## 2019-10-07 ENCOUNTER — Encounter: Payer: Self-pay | Admitting: Physical Therapy

## 2019-10-07 ENCOUNTER — Other Ambulatory Visit: Payer: Self-pay

## 2019-10-07 DIAGNOSIS — J3081 Allergic rhinitis due to animal (cat) (dog) hair and dander: Secondary | ICD-10-CM | POA: Diagnosis not present

## 2019-10-07 DIAGNOSIS — R262 Difficulty in walking, not elsewhere classified: Secondary | ICD-10-CM

## 2019-10-07 DIAGNOSIS — M6281 Muscle weakness (generalized): Secondary | ICD-10-CM

## 2019-10-07 DIAGNOSIS — M25562 Pain in left knee: Secondary | ICD-10-CM | POA: Diagnosis not present

## 2019-10-07 DIAGNOSIS — J3089 Other allergic rhinitis: Secondary | ICD-10-CM | POA: Diagnosis not present

## 2019-10-07 DIAGNOSIS — J301 Allergic rhinitis due to pollen: Secondary | ICD-10-CM | POA: Diagnosis not present

## 2019-10-07 DIAGNOSIS — G8929 Other chronic pain: Secondary | ICD-10-CM

## 2019-10-08 ENCOUNTER — Encounter: Payer: Self-pay | Admitting: Physical Therapy

## 2019-10-08 ENCOUNTER — Ambulatory Visit: Payer: PPO | Admitting: Psychiatry

## 2019-10-08 NOTE — Therapy (Signed)
[place], [place], [address] Phone: [phone]   Fax:  [phone]  Physical Therapy Treatment/Discharge   Patient Details  Name: Virginia Luna MRN: [id number] Date of Birth: [birth year] Referring Provider (PT): Dr Eunice Blase    Encounter Date: 10/07/2019  PT End of Session - 10/08/19 0844    Visit Number  33    Number of Visits  33    Date for PT Re-Evaluation  10/08/19    Authorization Type  third party payer    PT Start Time  0802    PT Stop Time  0842    PT Time Calculation (min)  40 min    Activity Tolerance  Patient tolerated treatment well    Behavior During Therapy  [facility] for tasks assessed/performed       Past Medical History:  Diagnosis Date  . Allergies   . Anemia   . Arthritis   . Asthma   . Back pain   . Chronic pain   . Diabetes ([place])   . Diabetes mellitus without complication ([place])   . Edema, lower extremity   . Fibromyalgia   . High blood pressure   . History of stomach ulcers   . IBS (irritable bowel syndrome)   . Joint pain   . Sleep apnea    did use cpap-lost 25lb-says she does not need it  . Wears contact lenses     Past Surgical History:  Procedure Laterality Date  . BREAST BIOPSY    . BREAST EXCISIONAL BIOPSY    . BREAST LUMPECTOMY WITH RADIOACTIVE SEED LOCALIZATION Bilateral 11/24/2014   Procedure: BILATERAL BREAST LUMPECTOMY WITH RADIOACTIVE SEED LOCALIZATION;  Surgeon: [name], MD;  Location: [place];  Service: General;  Laterality: Bilateral;  . CHOLECYSTECTOMY    . COLONOSCOPY    . DILATION AND CURETTAGE OF UTERUS    . ESOPHAGOGASTRODUODENOSCOPY (EGD) WITH PROPOFOL N/A 07/23/2016   Procedure: ESOPHAGOGASTRODUODENOSCOPY (EGD) WITH PROPOFOL;  Surgeon: Laurence Spates, MD;  [facility];  Service: Endoscopy;  Laterality: N/A;  . ESOPHAGOGASTRODUODENOSCOPY (EGD) WITH PROPOFOL N/A 06/11/2018   Procedure:  ESOPHAGOGASTRODUODENOSCOPY (EGD) WITH PROPOFOL;  Surgeon: Laurence Spates, MD;  [facility];  Service: Endoscopy;  Laterality: N/A;  . LUMBAR LAMINECTOMY  2010  . UMBILICAL HERNIA REPAIR     age 85  . UPPER GI ENDOSCOPY      There were no vitals filed for this visit.  Subjective Assessment - 10/07/19 0803    Subjective  Patients right knee ishurting her today. She continues to work on her exercises.    Limitations  Standing    How long can you sit comfortably?  Sitting for too long stiffens her knees    How long can you stand comfortably?  varies how long she can stand    How long can you walk comfortably?  trying to walk 5 min 3 times a day    Diagnostic tests  X-ray but results arent in the computer; Per patient bone spurs and arthritis.    Currently in Pain?  Yes    Pain Score  5     Pain Location  Knee    Pain Orientation  Right;Left    Pain Descriptors / Indicators  Aching    Pain Type  Chronic pain    Pain Radiating Towards  standing and walking    Pain Onset  More than a month ago    Pain Frequency  Constant  Aggravating Factors   standing and walking    Pain Relieving Factors  rest    Effect of Pain on Daily Activities  difficulty walking long distances    Multiple Pain Sites  No         [facility] PT Assessment - 10/08/19 0001      Strength   Right Hip Flexion  4+/5    Right Hip ABduction  4+/5    Right Hip ADduction  4+/5    Left Hip Flexion  4+/5    Left Hip ABduction  4+/5    Left Hip ADduction  4+/5    Right Knee Flexion  4+/5    Right Knee Extension  4+/5    Left Knee Flexion  4+/5    Left Knee Extension  4+/5      Ambulation/Gait   Gait Comments  improved single leg stance on the right; still has decreased hip flexion                    [facility] Adult PT Treatment/Exercise - 10/08/19 0001      Self-Care   Other Self-Care Comments   reviewed complete HEP; broke it up into groups of exercises for her to complete reviewed progression of  exercises, reviewed gym exercises and proper set up of equipment.       Knee/Hip Exercises: Stretches   Active Hamstring Stretch  2 reps;20 seconds    Active Hamstring Stretch Limitations  seated hamstring stretch 3x20 sec hold       Knee/Hip Exercises: Aerobic   Nustep  5 min L4       Knee/Hip Exercises: Standing   Heel Raises Limitations  x20     Knee Flexion Limitations  standing march x15 each leg     Lateral Step Up  10 reps;Both;Hand Hold: 2;2 sets;Step Height: 4"    Forward Step Up  10 reps;Hand Hold: 2;Both;Step Height: 6";2 sets    Other Standing Knee Exercises  dead lift 15lb 2x10; kettle bell swing mod cuing 2x10;       Knee/Hip Exercises: Seated   Heel Slides Limitations  hamstirng curl gren x20     Clamshell with TheraBand  Green   x20      Knee/Hip Exercises: Supine   Bridges Limitations  2x10    Straight Leg Raises Limitations  2x10 right x10 left       Manual Therapy   Manual therapy comments  reviewed self soft tissue mobilization with tiger tail     Joint Mobilization  PA and AP glides Grade II ands III with distraction     Soft tissue mobilization  to left anterior knee / posterior knee     Passive ROM  into knee extension              PT Education - 10/07/19 0833    Education Details  revieiewed final HEP    Person(s) Educated  Patient    Methods  Explanation;Demonstration;Tactile cues;Verbal cues    Comprehension  Verbalized understanding;Tactile cues required;Verbal cues required;Returned demonstration       PT Short Term Goals - 10/08/19 0853      PT SHORT TERM GOAL #1   Title  Patient will be indepenent with basic HEP    Baseline  working on basic HEP    Time  3    Period  Weeks    Status  Achieved    Target Date  04/10/19      PT SHORT  TERM GOAL #2   Title  Patient will demonstrate a 50% improvement in patella movement    Baseline  full movement bilateral    Time  3    Period  Weeks    Status  Achieved    Target Date  04/10/19       PT SHORT TERM GOAL #3   Title  Patient will extend bilateral LE without increased pain    Baseline  minor pain with edn rnage extension    Time  3    Period  Weeks    Status  Achieved    Target Date  04/10/19        PT Long Term Goals - 10/08/19 0854      PT LONG TERM GOAL #1   Title  Patient will transfer sit to stand independently without use of hands and without increased pain    Baseline  sit to stand 5x without hands    Time  6    Period  Weeks    Status  Achieved      PT LONG TERM GOAL #2   Title  Patient will stand for 10 minutes without increased pain in order to perfrom ADL's    Baseline  can stand with minor pain    Time  6    Status  Achieved      PT LONG TERM GOAL #3   Title  Patient will ambualte 150' without increased pain with LRAD in order to improve ability to go to appointments    Baseline  able to walk without device    Time  6    Period  Weeks    Status  Achieved            Plan - 10/08/19 8756    Clinical Impression Statement  Patient has reached max benefit fro PT. She has a complete program for home. [facility] Her pain fluctuates between a 3-5 depening on activity and weather. She is very ocnsitent with her exercises. She was adivsed that she needs to do her exercises over a long period of time. Therapy reviewed her complete HEP as well as activity progression. She was advised to get cuff weights for home,    Personal Factors and Comorbidities  Comorbidity 1;Comorbidity 2;Comorbidity 3+    Comorbidities  fibromaylgia, anxiety, obesity    Examination-Activity Limitations  Transfers;Locomotion Level;Sit;Sleep;Squat;Stairs;Stand    Examination-Participation Restrictions  Community Activity;Cleaning;Laundry    Stability/Clinical Decision Making  Evolving/Moderate complexity    Clinical Decision Making  Moderate    Rehab Potential  Good    PT Frequency  1x / week    PT Duration  6 weeks    PT Treatment/Interventions   ADLs/Self Care Home Management;Cryotherapy;Electrical Stimulation;Moist Heat;Traction;Ultrasound;DME Instruction;Stair training;Functional mobility training;Therapeutic activities;Therapeutic exercise;Gait training;Neuromuscular re-education;Patient/family education;Passive range of motion;Manual techniques;Taping;Spinal Manipulations;Joint Manipulations    PT Next Visit Plan  continue with progression into gym activity; review abdominal breathing    PT Home Exercise Plan  quad set, seated clam with band, self patella mobs, supine heel slides, bridge, SAQ, LAQ sink squat, mini lunge forward, side and backward with UE support. hip hinge and deadlift on raised step    Consulted and Agree with Plan of Care  Patient       Patient will benefit from skilled therapeutic intervention in order to improve the following deficits and impairments:  Abnormal gait, Decreased range of motion, Difficulty walking, Obesity, Decreased endurance, Decreased mobility, Decreased strength, Improper body  mechanics, Decreased activity tolerance, Pain  Visit Diagnosis: Chronic pain of left knee  Difficulty in walking, not elsewhere classified  Chronic pain of right knee  Muscle weakness (generalized)  PHYSICAL THERAPY DISCHARGE SUMMARY  Visits from Start of Care: 33  Current functional level related to goals / functional outcomes: Significant improvement in knee extension, pain, and activity level   Remaining deficits: Continues to have pain but has a plan to continue to work on   Education / Equipment: HEP   Plan: Patient agrees to discharge.  Patient goals were met. Patient is being discharged due to being pleased with the current functional level.  ?????       Problem List There are no problems to display for this patient.   Carney Living PT DPT  10/08/2019, 8:54 AM  [facility] [address] [place], [place], [address] Phone: [phone]    Fax:  [phone]  Name: ARIYA BOHANNON MRN: [id number] Date of Birth: 04-Sep-1960

## 2019-10-12 ENCOUNTER — Encounter (INDEPENDENT_AMBULATORY_CARE_PROVIDER_SITE_OTHER): Payer: Self-pay | Admitting: Family Medicine

## 2019-10-12 ENCOUNTER — Ambulatory Visit (INDEPENDENT_AMBULATORY_CARE_PROVIDER_SITE_OTHER): Payer: PPO | Admitting: Family Medicine

## 2019-10-12 ENCOUNTER — Other Ambulatory Visit: Payer: Self-pay

## 2019-10-12 VITALS — BP 114/76 | HR 99 | Temp 98.3°F | Ht 61.0 in | Wt 282.0 lb

## 2019-10-12 DIAGNOSIS — Z6841 Body Mass Index (BMI) 40.0 and over, adult: Secondary | ICD-10-CM

## 2019-10-12 DIAGNOSIS — M1990 Unspecified osteoarthritis, unspecified site: Secondary | ICD-10-CM | POA: Diagnosis not present

## 2019-10-12 DIAGNOSIS — E119 Type 2 diabetes mellitus without complications: Secondary | ICD-10-CM

## 2019-10-12 DIAGNOSIS — E559 Vitamin D deficiency, unspecified: Secondary | ICD-10-CM | POA: Diagnosis not present

## 2019-10-12 DIAGNOSIS — R748 Abnormal levels of other serum enzymes: Secondary | ICD-10-CM | POA: Diagnosis not present

## 2019-10-12 DIAGNOSIS — K5909 Other constipation: Secondary | ICD-10-CM

## 2019-10-12 DIAGNOSIS — I10 Essential (primary) hypertension: Secondary | ICD-10-CM

## 2019-10-12 DIAGNOSIS — Z9189 Other specified personal risk factors, not elsewhere classified: Secondary | ICD-10-CM

## 2019-10-12 MED ORDER — LINACLOTIDE 72 MCG PO CAPS: 72.0000 ug | ORAL_CAPSULE | Freq: Every day | ORAL | 0 refills | Status: DC

## 2019-10-12 MED ORDER — VITAMIN D (ERGOCALCIFEROL) 1.25 MG (50000 UNIT) PO CAPS: 50000.0000 [IU] | ORAL_CAPSULE | ORAL | 0 refills | Status: DC

## 2019-10-12 NOTE — Progress Notes (Signed)
Chief Complaint:   OBESITY Virginia Luna is here to discuss her progress with her obesity treatment plan along with follow-up of her obesity related diagnoses. Virginia Luna is on the Category 1 Plan and states she is following her eating plan approximately 75% of the time. Virginia Luna states she is exercising for 0 minutes 0 times per week.  Today's visit was #: 4 Starting weight: 288 lbs Starting date: 08/27/2019 Today's weight: 282 lbs Today's date: 10/12/2019 Total lbs lost to date: 6 lbs Total lbs lost since last in-office visit: 1 lbs  Interim History: Virginia Luna reports that she had pasta yesterday and some nuts last night.  She has been eating prepared meals.  She has increased her intake of [place] yogurt, fish, chicken, and beans.  Subjective:   1. Type 2 diabetes mellitus without complication, without long-term current use of insulin (HCC) Medications reviewed. She is taking metformin 500 mg daily.  No results found for: HGBA1C Lab Results  Component Value Date   CREATININE 0.67 08/27/2019   Lab Results  Component Value Date   INSULIN 15.9 08/27/2019   2. Vitamin D deficiency Virginia Luna's Vitamin D level was 11.2 on 08/27/2019. She is currently taking vit D. She denies nausea, vomiting or muscle weakness.  3. Essential hypertension Review: taking medications as instructed, no medication side effects noted, no chest pain on exertion, no dyspnea on exertion, no swelling of ankles.   BP Readings from Last 3 Encounters:  10/12/19 114/76  09/28/19 (!) 147/87  09/10/19 136/83   4. Elevated alkaline phosphatase level Lab Results  Component Value Date   ALKPHOS 157 (H) 08/27/2019   5. Other type of osteoarthritis, unspecified site Virginia Luna is followed by Orthopedics for osteoarthritis.  6. Chronic constipation Virginia Luna notes constipation.    7. At risk for heart disease Virginia Luna is at a higher than average risk for cardiovascular disease due to obesity. Reviewed: no chest pain on  exertion, no dyspnea on exertion, and no swelling of ankles.  Assessment/Plan:   1. Type 2 diabetes mellitus without complication, without long-term current use of insulin (HCC) Good blood sugar control is important to decrease the likelihood of diabetic complications such as nephropathy, neuropathy, limb loss, blindness, coronary artery disease, and death. Intensive lifestyle modification including diet, exercise and weight loss are the first line of treatment for diabetes.   Orders - CBC with Differential/Platelet - Comprehensive metabolic panel  2. Vitamin D deficiency Low Vitamin D level contributes to fatigue and are associated with obesity, breast, and colon cancer. She agrees to continue to take prescription Vitamin D @50 ,000 IU every week and will follow-up for routine testing of Vitamin D, at least 2-3 times per year to avoid over-replacement.  Orders - Vitamin D, Ergocalciferol, (DRISDOL) 1.25 MG (50000 UNIT) CAPS capsule; Take 1 capsule (50,000 Units total) by mouth every 7 (seven) days.  Dispense: 4 capsule; Refill: 0  3. Essential hypertension Virginia Luna is working on healthy weight loss and exercise to improve blood pressure control. We will watch for signs of hypotension as she continues her lifestyle modifications.  Orders - CBC with Differential/Platelet  4. Elevated alkaline phosphatase level Will check labs today.  Orders - Gamma GT  5. Other type of osteoarthritis, unspecified site Will follow because mobility and pain control are important for weight management.  6. Chronic constipation Virginia Luna was informed that a decrease in bowel movement frequency is normal while losing weight, but stools should not be hard or painful. Orders and follow up as documented  in patient record.   Counseling Getting to Good Bowel Health: Your goal is to have one soft bowel movement each day. Drink at least 8 glasses of water each day. Eat plenty of fiber (goal is over 25 grams each  day). It is best to get most of your fiber from dietary sources which includes leafy green vegetables, fresh fruit, and whole grains. You may need to add fiber with the help of OTC fiber supplements. These include Metamucil, Citrucel, and Flaxseed. If you are still having trouble, try adding Miralax or Magnesium Citrate. If all of these changes do not work, Cabin crew.  Orders - linaclotide (LINZESS) 72 MCG capsule; Take 1 capsule (72 mcg total) by mouth daily before breakfast.  Dispense: 30 capsule; Refill: 0  7. At risk for heart disease Virginia Luna was given approximately 15 minutes of coronary artery disease prevention counseling today. She is 59 y.o. female and has risk factors for heart disease including obesity. We discussed intensive lifestyle modifications today with an emphasis on specific weight loss instructions and strategies.   Repetitive spaced learning was employed today to elicit superior memory formation and behavioral change.  8. Class 3 severe obesity with serious comorbidity and body mass index (BMI) of 50.0 to 59.9 in adult, unspecified obesity type (HCC) Virginia Luna is currently in the action stage of change. As such, her goal is to continue with weight loss efforts. She has agreed to the Category 1 Plan.   Exercise goals: All adults should avoid inactivity. Some physical activity is better than none, and adults who participate in any amount of physical activity gain some health benefits.  Behavioral modification strategies: increasing lean protein intake.  Virginia Luna has agreed to follow-up with our clinic in 2 weeks. She was informed of the importance of frequent follow-up visits to maximize her success with intensive lifestyle modifications for her multiple health conditions.   Virginia Luna was informed we would discuss her lab results at her next visit unless there is a critical issue that needs to be addressed sooner. Virginia Luna agreed to keep her next visit at the agreed upon  time to discuss these results.  Objective:   Blood pressure 114/76, pulse 99, temperature 98.3 F (36.8 C), temperature source Oral, height 5\' 1"  (1.549 m), weight 282 lb (127.9 kg), SpO2 97 %. Body mass index is 53.28 kg/m.  General: Cooperative, alert, well developed, in no acute distress. HEENT: Conjunctivae and lids unremarkable. Cardiovascular: Regular rhythm.  Lungs: Normal work of breathing. Neurologic: No focal deficits.   Lab Results  Component Value Date   CREATININE 0.67 08/27/2019   BUN 14 08/27/2019   NA 145 (H) 08/27/2019   K 4.6 08/27/2019   CL 103 08/27/2019   CO2 26 08/27/2019   Lab Results  Component Value Date   ALT 12 08/27/2019   AST 9 08/27/2019   ALKPHOS 157 (H) 08/27/2019   BILITOT 0.4 08/27/2019   No results found for: HGBA1C Lab Results  Component Value Date   INSULIN 15.9 08/27/2019   Lab Results  Component Value Date   TSH 1.490 08/27/2019   No results found for: CHOL, HDL, LDLCALC, LDLDIRECT, TRIG, CHOLHDL Lab Results  Component Value Date   WBC 8.1 08/27/2019   HGB 11.6 08/27/2019   HCT 37.0 08/27/2019   MCV 85 08/27/2019   PLT 474 (H) 08/27/2019   Lab Results  Component Value Date   IRON 45 08/27/2019   TIBC 351 08/27/2019   FERRITIN 62 08/27/2019   Attestation  Statements:   Reviewed by clinician on day of visit: allergies, medications, problem list, medical history, surgical history, family history, social history, and previous encounter notes.  I, Water quality scientist, CMA, am acting as Location manager for [employer], DO.  I have reviewed the above documentation for accuracy and completeness, and I agree with the above. Briscoe Deutscher, DO

## 2019-10-13 LAB — COMPREHENSIVE METABOLIC PANEL
ALT: 17 IU/L (ref 0–32)
AST: 15 IU/L (ref 0–40)
Albumin/Globulin Ratio: 1.9 (ref 1.2–2.2)
Albumin: 4.6 g/dL (ref 3.8–4.9)
Alkaline Phosphatase: 154 IU/L — ABNORMAL HIGH (ref 39–117)
BUN/Creatinine Ratio: 18 (ref 9–23)
BUN: 12 mg/dL (ref 6–24)
Bilirubin Total: 0.3 mg/dL (ref 0.0–1.2)
CO2: 25 mmol/L (ref 20–29)
Calcium: 9.9 mg/dL (ref 8.7–10.2)
Chloride: 100 mmol/L (ref 96–106)
Creatinine, Ser: 0.67 mg/dL (ref 0.57–1.00)
GFR calc Af Amer: 113 mL/min/{1.73_m2} (ref >59)
GFR calc non Af Amer: 98 mL/min/{1.73_m2} (ref >59)
Globulin, Total: 2.4 g/dL (ref 1.5–4.5)
Glucose: 98 mg/dL (ref 65–99)
Potassium: 4.2 mmol/L (ref 3.5–5.2)
Sodium: 141 mmol/L (ref 134–144)
Total Protein: 7 g/dL (ref 6.0–8.5)

## 2019-10-13 LAB — CBC WITH DIFFERENTIAL/PLATELET
Basophils Absolute: 0.1 10*3/uL (ref 0.0–0.2)
Basos: 1 %
EOS (ABSOLUTE): 0.2 10*3/uL (ref 0.0–0.4)
Eos: 2 %
Hematocrit: 38.5 % (ref 34.0–46.6)
Hemoglobin: 12.4 g/dL (ref 11.1–15.9)
Immature Grans (Abs): 0.1 10*3/uL (ref 0.0–0.1)
Immature Granulocytes: 1 %
Lymphocytes Absolute: 2.1 10*3/uL (ref 0.7–3.1)
Lymphs: 23 %
MCH: 27.2 pg (ref 26.6–33.0)
MCHC: 32.2 g/dL (ref 31.5–35.7)
MCV: 84 fL (ref 79–97)
Monocytes Absolute: 0.4 10*3/uL (ref 0.1–0.9)
Monocytes: 4 %
Neutrophils Absolute: 6.4 10*3/uL (ref 1.4–7.0)
Neutrophils: 69 %
Platelets: 450 10*3/uL (ref 150–450)
RBC: 4.56 x10E6/uL (ref 3.77–5.28)
RDW: 16.3 % — ABNORMAL HIGH (ref 11.7–15.4)
WBC: 9.2 10*3/uL (ref 3.4–10.8)

## 2019-10-13 LAB — GAMMA GT: GGT: 87 IU/L — ABNORMAL HIGH (ref 0–60)

## 2019-10-14 DIAGNOSIS — J301 Allergic rhinitis due to pollen: Secondary | ICD-10-CM | POA: Diagnosis not present

## 2019-10-14 DIAGNOSIS — J3089 Other allergic rhinitis: Secondary | ICD-10-CM | POA: Diagnosis not present

## 2019-10-14 DIAGNOSIS — J3081 Allergic rhinitis due to animal (cat) (dog) hair and dander: Secondary | ICD-10-CM | POA: Diagnosis not present

## 2019-10-16 DIAGNOSIS — E119 Type 2 diabetes mellitus without complications: Secondary | ICD-10-CM | POA: Diagnosis not present

## 2019-10-16 DIAGNOSIS — J4520 Mild intermittent asthma, uncomplicated: Secondary | ICD-10-CM | POA: Diagnosis not present

## 2019-10-16 DIAGNOSIS — D649 Anemia, unspecified: Secondary | ICD-10-CM | POA: Diagnosis not present

## 2019-10-16 DIAGNOSIS — I10 Essential (primary) hypertension: Secondary | ICD-10-CM | POA: Diagnosis not present

## 2019-10-16 DIAGNOSIS — Z7984 Long term (current) use of oral hypoglycemic drugs: Secondary | ICD-10-CM | POA: Diagnosis not present

## 2019-10-16 DIAGNOSIS — E7800 Pure hypercholesterolemia, unspecified: Secondary | ICD-10-CM | POA: Diagnosis not present

## 2019-10-18 ENCOUNTER — Ambulatory Visit: Payer: PPO | Attending: Internal Medicine

## 2019-10-18 DIAGNOSIS — Z23 Encounter for immunization: Secondary | ICD-10-CM

## 2019-10-18 NOTE — Progress Notes (Signed)
   Covid-19 Vaccination Clinic  Name:  Virginia Luna    MRN: [id number] DOB: 02-Mar-1961  10/18/2019  [name] was observed post Covid-19 immunization for 46 due to a minor reaction. Ms. Patient complained of feeling "strange and loopy" she had some tingling in her Right hand and leg. Vaccine was given in right arm. She stated she had not eaten today.Her vital signs were taken BP was 137/78 HR 101 Sats 96%. She was given crackers and water. [name] stayed for 30 additional minutes why we monitored her vitals sign. She states she has fibromyalgia and symptoms are similar. After the additional 30 minutes she felt comfortable leaving. VS 134/87 HR 97 sats 95%. She was provided with Vaccine Information Sheet and instruction to access the V-Safe system.    [name] was instructed to call 911 with any severe reactions post vaccine: Marland Kitchen Difficulty breathing  . Swelling of your face and throat  . A fast heartbeat  . A bad rash all over your body  . Dizziness and weakness    Immunizations Administered    Name Date Dose VIS Date Route   Pfizer COVID-19 Vaccine 10/18/2019  9:16 AM 0.3 mL 07/31/2019 Intramuscular   Manufacturer: [place]   Lot: [id number]   [place]: [id number]

## 2019-10-19 ENCOUNTER — Encounter: Payer: Self-pay | Admitting: Psychiatry

## 2019-10-19 ENCOUNTER — Ambulatory Visit (INDEPENDENT_AMBULATORY_CARE_PROVIDER_SITE_OTHER): Payer: PPO | Admitting: Psychiatry

## 2019-10-19 DIAGNOSIS — F450 Somatization disorder: Secondary | ICD-10-CM | POA: Diagnosis not present

## 2019-10-19 DIAGNOSIS — F419 Anxiety disorder, unspecified: Secondary | ICD-10-CM | POA: Diagnosis not present

## 2019-10-19 DIAGNOSIS — F5105 Insomnia due to other mental disorder: Secondary | ICD-10-CM | POA: Diagnosis not present

## 2019-10-19 NOTE — Progress Notes (Signed)
Virginia Luna [id number] 20-Nov-1960 59 y.o.  Virtual Visit via [place]  I connected with pt by WebEx and verified that I am speaking with the correct person using two identifiers.   I discussed the limitations, risks, security and privacy concerns of performing an evaluation and management service by Virginia Luna and the availability of in person appointments. I also discussed with the patient that there may be a patient responsible charge related to this service. The patient expressed understanding and agreed to proceed.  I discussed the assessment and treatment plan with the patient. The patient was provided an opportunity to ask questions and all were answered. The patient agreed with the plan and demonstrated an understanding of the instructions.   The patient was advised to call back or seek an in-person evaluation if the symptoms worsen or if the condition fails to improve as anticipated.  I provided 30 minutes of video time during this encounter. The call started at 930 and ended at 10:00. The patient was located at home and the provider was located office.   Subjective:   Patient ID:  Virginia Luna is a 59 y.o. (DOB 1960-11-29) female.  Chief Complaint:  Chief Complaint  Patient presents with  . Follow-up  . Sleeping Problem  . Anxiety    HPI Virginia Luna presents for follow-up of chronic insomnia.  Last seen May 2020.  No meds were changed  No Covid.  Maintaining OK.  Isolating and wearing mask.  Outside church when possible.  Not depressed.    Sleep affected by pain from a fall a couple months ago. Sleep is OK unless pain interferes.  If can not stay asleep then will get up and work a puzzle and then go back to sleep.    Awakens in pain from FM and related to fall. Trying to keep up with exercises.   Pain is still high more often than it should be.   Seeing Dr. Juleen Luna about weight loss.  No new meds.  Just finished PT from the last sig fall.    Patient reports stable  mood and denies depressed or irritable moods.  Patient denies any recent difficulty with anxiety.  Patient denies difficulty with sleep initiation but does with maintenance. Denies appetite disturbance.  Patient reports that energy and motivation have been good.  Patient denies any difficulty with concentration.  Patient denies any suicidal ideation.   Past Psychiatric Medication Trials: Doxepin, Rozerem,  amitriptyline mirtazapine Ambien quetiapine olanzapine Lyrica duloxetine gabapentin, cyclobenzaprine  Review of Systems:  Review of Systems  HENT: Positive for postnasal drip, rhinorrhea and sneezing.   Musculoskeletal: Positive for back pain, myalgias and neck pain.  Neurological: Positive for weakness. Negative for tremors.   Medications: I have reviewed the patient's current medications.  Current Outpatient Medications  Medication Sig Dispense Refill  . AFLURIA QUADRIVALENT 0.5 ML injection     . albuterol (PROVENTIL HFA;VENTOLIN HFA) 108 (90 BASE) MCG/ACT inhaler Inhale 2 puffs into the lungs every 6 (six) hours as needed for wheezing or shortness of breath.     Marland Kitchen amLODipine (NORVASC) 5 MG tablet Take 5 mg by mouth daily.    Marland Kitchen azelastine (ASTELIN) 0.1 % nasal spray Place 1 spray into both nostrils 2 (two) times daily as needed for allergies.  2  . Azelastine-Fluticasone (DYMISTA) 137-50 MCG/ACT SUSP Place 1 spray into the nose at bedtime.    . cyclobenzaprine (FLEXERIL) 10 MG tablet Take 10 mg by mouth 2 (two) times daily as needed  for muscle spasms.     . diclofenac (VOLTAREN) 75 MG EC tablet     . diphenhydrAMINE (BENADRYL) 25 MG tablet Take 50 mg by mouth at bedtime.    Marland Kitchen EPINEPHrine (EPIPEN 2-PAK) 0.3 mg/0.3 mL IJ SOAJ injection Inject 0.3 mg into the muscle once.    . fexofenadine (ALLEGRA) 180 MG tablet Take 180 mg by mouth daily.  5  . fluticasone (FLONASE) 50 MCG/ACT nasal spray INSTILL 1 SPRAY IN EACH NOSTRIL TWICE A DAY    . fluticasone furoate-vilanterol (BREO ELLIPTA)  200-25 MCG/INH AEPB Inhale 1 puff daily into the lungs.    . gabapentin (NEURONTIN) 300 MG capsule Take 300 mg by mouth at bedtime.     Marland Kitchen HYDROcodone-acetaminophen (NORCO/VICODIN) 5-325 MG tablet Take 1 tablet by mouth 2 (two) times daily as needed for moderate pain.   0  . linaclotide (LINZESS) 72 MCG capsule Take 1 capsule (72 mcg total) by mouth daily before breakfast. 30 capsule 0  . Melatonin 5 MG CAPS Take 10-15 mg by mouth at bedtime.    . metFORMIN (GLUCOPHAGE-XR) 500 MG 24 hr tablet Take 500 mg by mouth at bedtime.     Marland Kitchen oxybutynin (DITROPAN) 5 MG tablet Take 5 mg by mouth 2 (two) times daily as needed for bladder spasms.  0  . oxyCODONE (OXY IR/ROXICODONE) 5 MG immediate release tablet Take 5 mg by mouth 2 (two) times daily as needed for pain.  0  . pantoprazole (PROTONIX) 40 MG tablet Take 40 mg by mouth 2 (two) times daily.  1  . promethazine (PHENERGAN) 25 MG tablet Take 25 mg by mouth every 6 (six) hours as needed for nausea or vomiting.    Marland Kitchen QUEtiapine (SEROQUEL) 300 MG tablet TAKE 1 TABLET BY MOUTH EVERYDAY AT BEDTIME 90 tablet 2  . SUMAtriptan (IMITREX) 100 MG tablet Take 100 mg by mouth every 2 (two) hours as needed for migraine. May repeat in 2 hours if headache persists or recurs.    . torsemide (DEMADEX) 20 MG tablet Take 20 mg by mouth daily as needed (swelling).    . triamcinolone cream (KENALOG) 0.1 % Apply 1 application topically 2 (two) times daily as needed for rash.  0  . Vitamin D, Ergocalciferol, (DRISDOL) 1.25 MG (50000 UNIT) CAPS capsule Take 1 capsule (50,000 Units total) by mouth every 7 (seven) days. 4 capsule 0  . zolpidem (AMBIEN) 10 MG tablet TAKE 1 TABLET BY MOUTH AT BEDTIME 30 tablet 3   No current facility-administered medications for this visit.    Medication Side Effects: None  Allergies:  Allergies  Allergen Reactions  . Rocephin [Ceftriaxone] Anaphylaxis  . Morphine And Related Nausea And Vomiting    Doesn't work  . Prednisone Itching and  Swelling  . Sulfa Antibiotics Itching and Swelling    Past Medical History:  Diagnosis Date  . Allergies   . Anemia   . Arthritis   . Asthma   . Back pain   . Chronic pain   . Diabetes ([place])   . Diabetes mellitus without complication ([place])   . Edema, lower extremity   . Fibromyalgia   . High blood pressure   . History of stomach ulcers   . IBS (irritable bowel syndrome)   . Joint pain   . Sleep apnea    did use cpap-lost 25lb-says she does not need it  . Wears contact lenses     Family History  Problem Relation Age of Onset  . Breast cancer  Paternal Aunt   . Breast cancer Paternal Grandmother   . Obesity Mother   . Diabetes Father   . High blood pressure Father   . Sudden death Father     Social History   Socioeconomic History  . Marital status: Single    Spouse name: Not on file  . Number of children: Not on file  . Years of education: Not on file  . Highest education level: Not on file  Occupational History  . Occupation: disabled    Comment: Psychologist, occupational at daycare  Tobacco Use  . Smoking status: Never Smoker  . Smokeless tobacco: Never Used  Substance and Sexual Activity  . Alcohol use: No  . Drug use: No  . Sexual activity: Not on file  Other Topics Concern  . Not on file  Social History Narrative  . Not on file   Social Determinants of Health   Financial Resource Strain:   . Difficulty of Paying Living Expenses: Not on file  Food Insecurity:   . Worried About Charity fundraiser in the Last Year: Not on file  . Ran Out of Food in the Last Year: Not on file  Transportation Needs:   . Lack of Transportation (Medical): Not on file  . Lack of Transportation (Non-Medical): Not on file  Physical Activity:   . Days of Exercise per Week: Not on file  . Minutes of Exercise per Session: Not on file  Stress:   . Feeling of Stress : Not on file  Social Connections:   . Frequency of Communication with Friends and Family: Not on file  . Frequency of  Social Gatherings with Friends and Family: Not on file  . Attends Religious Services: Not on file  . Active Member of Clubs or Organizations: Not on file  . Attends Archivist Meetings: Not on file  . Marital Status: Not on file  Intimate Partner Violence:   . Fear of Current or Ex-Partner: Not on file  . Emotionally Abused: Not on file  . Physically Abused: Not on file  . Sexually Abused: Not on file    Past Medical History, Surgical history, Social history, and Family history were reviewed and updated as appropriate.   Please see review of systems for further details on the patient's review from today.   Objective:   Physical Exam:  LMP  (LMP Unknown)   Physical Exam Neurological:     Mental Status: She is alert and oriented to person, place, and time.     Cranial Nerves: No dysarthria.  Psychiatric:        Attention and Perception: Attention normal. She does not perceive auditory hallucinations.        Mood and Affect: Mood is anxious. Mood is not depressed.        Speech: Speech normal.        Behavior: Behavior is cooperative.        Thought Content: Thought content normal. Thought content is not paranoid or delusional. Thought content does not include homicidal or suicidal ideation. Thought content does not include homicidal or suicidal plan.        Cognition and Memory: Cognition and memory normal.        Judgment: Judgment normal.     Comments: Chronic somatic focus ongoing. Insight fair. Talkative per usual.     Lab Review:     Component Value Date/Time   NA 141 10/12/2019 1009   K 4.2 10/12/2019 1009   CL 100  10/12/2019 1009   CO2 25 10/12/2019 1009   GLUCOSE 98 10/12/2019 1009   GLUCOSE 107 (H) 07/02/2016 2205   BUN 12 10/12/2019 1009   CREATININE 0.67 10/12/2019 1009   CALCIUM 9.9 10/12/2019 1009   PROT 7.0 10/12/2019 1009   ALBUMIN 4.6 10/12/2019 1009   AST 15 10/12/2019 1009   ALT 17 10/12/2019 1009   ALKPHOS 154 (H) 10/12/2019 1009    BILITOT 0.3 10/12/2019 1009   GFRNONAA 98 10/12/2019 1009   GFRAA 113 10/12/2019 1009       Component Value Date/Time   WBC 9.2 10/12/2019 1009   WBC 11.1 (H) 07/02/2016 2204   RBC 4.56 10/12/2019 1009   RBC 4.58 07/02/2016 2204   HGB 12.4 10/12/2019 1009   HCT 38.5 10/12/2019 1009   PLT 450 10/12/2019 1009   MCV 84 10/12/2019 1009   MCH 27.2 10/12/2019 1009   MCH 25.8 (L) 07/02/2016 2204   MCHC 32.2 10/12/2019 1009   MCHC 31.4 07/02/2016 2204   RDW 16.3 (H) 10/12/2019 1009   LYMPHSABS 2.1 10/12/2019 1009   MONOABS 0.3 10/13/2008 1230   EOSABS 0.2 10/12/2019 1009   BASOSABS 0.1 10/12/2019 1009    No results found for: POCLITH, LITHIUM   No results found for: PHENYTOIN, PHENOBARB, VALPROATE, CBMZ   .res Assessment: Plan:    Anxiety with somatization  Insomnia due to mental condition   We discussed the short-term risks associated with benzodiazepines including sedation and increased fall risk among others.  Discussed long-term side effect risk including dependence, potential withdrawal symptoms, and the potential eventual dose-related risk of dementia. Discussed the possibility of splitting the zolpidem between sleep onset and then with the early morning awakening.  She says she is tried that and does better about taking it all at once first thing going to bed.  We discussed the risk of amnesia.  She is not had any problems with that.  Discussed potential metabolic side effects associated with atypical antipsychotics, as well as potential risk for movement side effects. Advised pt to contact office if movement side effects occur.  She has required quetiapine for chronic severe insomnia that is related to both anxiety and to fibromyalgia.  She gets clear benefit from quetiapine.  She is at the lowest effective dose.  We have tried to reduce dosages.  She is tolerating it well.  Disc low vitamin D dx and importance of it for Covid protection and mental health reasons.     Follow-up 9 months because of stability.  She has been on the same meds at the same dosage for several years.  Lynder Parents, MD, DFAPA   Please see After Visit Summary for patient specific instructions.  Future Appointments  Date Time Provider [place]  10/28/2019  9:20 AM Briscoe Deutscher, DO [facility] None  11/11/2019 11:45 AM [facility]    No orders of the defined types were placed in this encounter.     -------------------------------

## 2019-10-21 DIAGNOSIS — J3081 Allergic rhinitis due to animal (cat) (dog) hair and dander: Secondary | ICD-10-CM | POA: Diagnosis not present

## 2019-10-21 DIAGNOSIS — J3089 Other allergic rhinitis: Secondary | ICD-10-CM | POA: Diagnosis not present

## 2019-10-21 DIAGNOSIS — J301 Allergic rhinitis due to pollen: Secondary | ICD-10-CM | POA: Diagnosis not present

## 2019-10-22 DIAGNOSIS — E7800 Pure hypercholesterolemia, unspecified: Secondary | ICD-10-CM | POA: Diagnosis not present

## 2019-10-22 DIAGNOSIS — J4520 Mild intermittent asthma, uncomplicated: Secondary | ICD-10-CM | POA: Diagnosis not present

## 2019-10-22 DIAGNOSIS — I10 Essential (primary) hypertension: Secondary | ICD-10-CM | POA: Diagnosis not present

## 2019-10-22 DIAGNOSIS — E119 Type 2 diabetes mellitus without complications: Secondary | ICD-10-CM | POA: Diagnosis not present

## 2019-10-22 DIAGNOSIS — D649 Anemia, unspecified: Secondary | ICD-10-CM | POA: Diagnosis not present

## 2019-10-22 DIAGNOSIS — Z7984 Long term (current) use of oral hypoglycemic drugs: Secondary | ICD-10-CM | POA: Diagnosis not present

## 2019-10-28 ENCOUNTER — Ambulatory Visit (INDEPENDENT_AMBULATORY_CARE_PROVIDER_SITE_OTHER): Payer: PPO | Admitting: Family Medicine

## 2019-10-28 DIAGNOSIS — J3089 Other allergic rhinitis: Secondary | ICD-10-CM | POA: Diagnosis not present

## 2019-10-28 DIAGNOSIS — J3081 Allergic rhinitis due to animal (cat) (dog) hair and dander: Secondary | ICD-10-CM | POA: Diagnosis not present

## 2019-10-28 DIAGNOSIS — J301 Allergic rhinitis due to pollen: Secondary | ICD-10-CM | POA: Diagnosis not present

## 2019-11-02 DIAGNOSIS — R110 Nausea: Secondary | ICD-10-CM | POA: Diagnosis not present

## 2019-11-02 DIAGNOSIS — M797 Fibromyalgia: Secondary | ICD-10-CM | POA: Diagnosis not present

## 2019-11-02 DIAGNOSIS — K640 First degree hemorrhoids: Secondary | ICD-10-CM | POA: Diagnosis not present

## 2019-11-02 DIAGNOSIS — G894 Chronic pain syndrome: Secondary | ICD-10-CM | POA: Diagnosis not present

## 2019-11-02 DIAGNOSIS — E7800 Pure hypercholesterolemia, unspecified: Secondary | ICD-10-CM | POA: Diagnosis not present

## 2019-11-02 DIAGNOSIS — F5101 Primary insomnia: Secondary | ICD-10-CM | POA: Diagnosis not present

## 2019-11-02 DIAGNOSIS — E119 Type 2 diabetes mellitus without complications: Secondary | ICD-10-CM | POA: Diagnosis not present

## 2019-11-02 DIAGNOSIS — I10 Essential (primary) hypertension: Secondary | ICD-10-CM | POA: Diagnosis not present

## 2019-11-02 DIAGNOSIS — J4520 Mild intermittent asthma, uncomplicated: Secondary | ICD-10-CM | POA: Diagnosis not present

## 2019-11-02 DIAGNOSIS — K219 Gastro-esophageal reflux disease without esophagitis: Secondary | ICD-10-CM | POA: Diagnosis not present

## 2019-11-04 DIAGNOSIS — J3081 Allergic rhinitis due to animal (cat) (dog) hair and dander: Secondary | ICD-10-CM | POA: Diagnosis not present

## 2019-11-04 DIAGNOSIS — J301 Allergic rhinitis due to pollen: Secondary | ICD-10-CM | POA: Diagnosis not present

## 2019-11-04 DIAGNOSIS — J3089 Other allergic rhinitis: Secondary | ICD-10-CM | POA: Diagnosis not present

## 2019-11-10 DIAGNOSIS — J301 Allergic rhinitis due to pollen: Secondary | ICD-10-CM | POA: Diagnosis not present

## 2019-11-10 DIAGNOSIS — J3081 Allergic rhinitis due to animal (cat) (dog) hair and dander: Secondary | ICD-10-CM | POA: Diagnosis not present

## 2019-11-10 DIAGNOSIS — J3089 Other allergic rhinitis: Secondary | ICD-10-CM | POA: Diagnosis not present

## 2019-11-11 ENCOUNTER — Other Ambulatory Visit: Payer: Self-pay

## 2019-11-11 ENCOUNTER — Ambulatory Visit (INDEPENDENT_AMBULATORY_CARE_PROVIDER_SITE_OTHER): Payer: PPO | Admitting: Family Medicine

## 2019-11-11 ENCOUNTER — Encounter (INDEPENDENT_AMBULATORY_CARE_PROVIDER_SITE_OTHER): Payer: Self-pay | Admitting: Family Medicine

## 2019-11-11 ENCOUNTER — Ambulatory Visit: Payer: PPO | Attending: Internal Medicine

## 2019-11-11 VITALS — BP 148/85 | HR 115 | Temp 98.4°F | Ht 61.0 in | Wt 280.0 lb

## 2019-11-11 DIAGNOSIS — E559 Vitamin D deficiency, unspecified: Secondary | ICD-10-CM

## 2019-11-11 DIAGNOSIS — I10 Essential (primary) hypertension: Secondary | ICD-10-CM | POA: Diagnosis not present

## 2019-11-11 DIAGNOSIS — K5909 Other constipation: Secondary | ICD-10-CM | POA: Diagnosis not present

## 2019-11-11 DIAGNOSIS — Z23 Encounter for immunization: Secondary | ICD-10-CM

## 2019-11-11 DIAGNOSIS — E1169 Type 2 diabetes mellitus with other specified complication: Secondary | ICD-10-CM | POA: Diagnosis not present

## 2019-11-11 DIAGNOSIS — E1159 Type 2 diabetes mellitus with other circulatory complications: Secondary | ICD-10-CM

## 2019-11-11 DIAGNOSIS — Z6841 Body Mass Index (BMI) 40.0 and over, adult: Secondary | ICD-10-CM | POA: Diagnosis not present

## 2019-11-11 DIAGNOSIS — R748 Abnormal levels of other serum enzymes: Secondary | ICD-10-CM | POA: Diagnosis not present

## 2019-11-11 DIAGNOSIS — I152 Hypertension secondary to endocrine disorders: Secondary | ICD-10-CM

## 2019-11-11 NOTE — Progress Notes (Signed)
   Covid-19 Vaccination Clinic  Name:  Virginia Luna    MRN: [id number] DOB: July 24, 1961  11/11/2019  Virginia Luna was observed post Covid-19 immunization for 15 minutes without incident. She was provided with Vaccine Information Sheet and instruction to access the V-Safe system.   Virginia Luna was instructed to call 911 with any severe reactions post vaccine: Marland Kitchen Difficulty breathing  . Swelling of face and throat  . A fast heartbeat  . A bad rash all over body  . Dizziness and weakness   Immunizations Administered    Name Date Dose VIS Date Route   Pfizer COVID-19 Vaccine 11/11/2019 11:36 AM 0.3 mL 07/31/2019 Intramuscular   Manufacturer: [place]   Lot: [id number]   [place]: [id number]

## 2019-11-11 NOTE — Progress Notes (Signed)
Chief Complaint:   OBESITY Virginia Luna is here to discuss her progress with her obesity treatment plan along with follow-up of her obesity related diagnoses. Virginia Luna is on the Category 1 Plan and states she is following her eating plan approximately 60% of the time. Virginia Luna states she is walking and doing chair exercises for 30-60 minutes 2 times per week.  Today's visit was #: 5 Starting weight: 288 lbs Starting date: 08/27/2019 Today's weight: 280 lbs Today's date: 11/11/2019 Total lbs lost to date: 8 lbs Total lbs lost since last in-office visit: 2 lbs  Interim History: Virginia Luna says she is "focused on protein".  For breakfast she has been having eggs or [place] yogurt, nuts.  She says she "loves vegetables".  She has been drinking lots of water.  She has decreased her intake of diet soda.  Subjective:   1. Hypertension associated with diabetes ([place]) Review: taking medications as instructed, no medication side effects noted, no chest pain on exertion, no dyspnea on exertion, no swelling of ankles.  Blood pressure is not at goal.  BP Readings from Last 3 Encounters:  11/11/19 (!) 148/85  10/12/19 114/76  09/28/19 (!) 147/87   2. Type 2 diabetes mellitus with other specified complication, without long-term current use of insulin (HCC) Medications reviewed. She is taking metformin 500 mg daily.  Lab Results  Component Value Date   CREATININE 0.67 10/12/2019   Lab Results  Component Value Date   INSULIN 15.9 08/27/2019   3. Vitamin D deficiency Virginia Luna's Vitamin D level was 11.2 on 08/27/2019. She is currently taking vit D. She denies nausea, vomiting or muscle weakness.  4. Elevated alkaline phosphatase level She takes multiple medications.  She reports drinking no alcohol.  Lab Results  Component Value Date   GGT 87 (H) 10/12/2019   5. Chronic constipation Virginia Luna notes constipation.  She takes Linzess 72 mcg daily and says it is helping.  Assessment/Plan:   1.  Hypertension associated with diabetes ([place]) Virginia Luna is working on healthy weight loss and exercise to improve blood pressure control. We will watch for signs of hypotension as she continues her lifestyle modifications.  2. Type 2 diabetes mellitus with other specified complication, without long-term current use of insulin (HCC) Good blood sugar control is important to decrease the likelihood of diabetic complications such as nephropathy, neuropathy, limb loss, blindness, coronary artery disease, and death. Intensive lifestyle modification including diet, exercise and weight loss are the first line of treatment for diabetes.   3. Vitamin D deficiency Low Vitamin D level contributes to fatigue and are associated with obesity, breast, and colon cancer. She agrees to continue to take prescription Vitamin D @50 ,000 IU every week and will follow-up for routine testing of Vitamin D, at least 2-3 times per year to avoid over-replacement.  4. Elevated alkaline phosphatase level Will get RUQ ultrasound to evaluate liver/gallbladder.  5. Chronic constipation Addalynn was informed that a decrease in bowel movement frequency is normal while losing weight, but stools should not be hard or painful. Orders and follow up as documented in patient record.   Counseling Getting to Good Bowel Health: Your goal is to have one soft bowel movement each day. Drink at least 8 glasses of water each day. Eat plenty of fiber (goal is over 25 grams each day). It is best to get most of your fiber from dietary sources which includes leafy green vegetables, fresh fruit, and whole grains. You may need to add fiber with the help  of OTC fiber supplements. These include Metamucil, Citrucel, and Flaxseed. If you are still having trouble, try adding Miralax or Magnesium Citrate. If all of these changes do not work, Cabin crew.  6. Class 3 severe obesity with serious comorbidity and body mass index (BMI) of 50.0 to 59.9 in  adult, unspecified obesity type (HCC) Virginia Luna is currently in the action stage of change. As such, her goal is to continue with weight loss efforts. She has agreed to the Category 1 Plan.   Exercise goals: As is.  Behavioral modification strategies: increasing lean protein intake and increasing water intake.  Virginia Luna has agreed to follow-up with our clinic in 2 weeks. She was informed of the importance of frequent follow-up visits to maximize her success with intensive lifestyle modifications for her multiple health conditions.   Objective:   Blood pressure (!) 148/85, pulse (!) 115, temperature 98.4 F (36.9 C), temperature source Oral, height 5\' 1"  (1.549 m), weight 280 lb (127 kg), SpO2 99 %. Body mass index is 52.91 kg/m.  General: Cooperative, alert, well developed, in no acute distress. HEENT: Conjunctivae and lids unremarkable. Cardiovascular: Regular rhythm.  Lungs: Normal work of breathing. Neurologic: No focal deficits.   Lab Results  Component Value Date   CREATININE 0.67 10/12/2019   BUN 12 10/12/2019   NA 141 10/12/2019   K 4.2 10/12/2019   CL 100 10/12/2019   CO2 25 10/12/2019   Lab Results  Component Value Date   ALT 17 10/12/2019   AST 15 10/12/2019   GGT 87 (H) 10/12/2019   ALKPHOS 154 (H) 10/12/2019   BILITOT 0.3 10/12/2019   Lab Results  Component Value Date   INSULIN 15.9 08/27/2019   Lab Results  Component Value Date   TSH 1.490 08/27/2019   Lab Results  Component Value Date   WBC 9.2 10/12/2019   HGB 12.4 10/12/2019   HCT 38.5 10/12/2019   MCV 84 10/12/2019   PLT 450 10/12/2019   Lab Results  Component Value Date   IRON 45 08/27/2019   TIBC 351 08/27/2019   FERRITIN 62 08/27/2019   Attestation Statements:   Reviewed by clinician on day of visit: allergies, medications, problem list, medical history, surgical history, family history, social history, and previous encounter notes.  I, Water quality scientist, CMA, am acting as Location manager  for [employer], DO.  I have reviewed the above documentation for accuracy and completeness, and I agree with the above. Briscoe Deutscher, DO

## 2019-11-12 ENCOUNTER — Other Ambulatory Visit: Payer: Self-pay | Admitting: Psychiatry

## 2019-11-16 ENCOUNTER — Encounter (INDEPENDENT_AMBULATORY_CARE_PROVIDER_SITE_OTHER): Payer: Self-pay | Admitting: Family Medicine

## 2019-11-16 ENCOUNTER — Telehealth (INDEPENDENT_AMBULATORY_CARE_PROVIDER_SITE_OTHER): Payer: Self-pay | Admitting: Family Medicine

## 2019-11-17 ENCOUNTER — Encounter (INDEPENDENT_AMBULATORY_CARE_PROVIDER_SITE_OTHER): Payer: Self-pay | Admitting: Family Medicine

## 2019-11-17 NOTE — Telephone Encounter (Signed)
Sent the pt a mychart message. Dayonna Selbe, CMA

## 2019-11-17 NOTE — Telephone Encounter (Signed)
Please review. Thanks!  

## 2019-11-18 DIAGNOSIS — J301 Allergic rhinitis due to pollen: Secondary | ICD-10-CM | POA: Diagnosis not present

## 2019-11-18 DIAGNOSIS — J3081 Allergic rhinitis due to animal (cat) (dog) hair and dander: Secondary | ICD-10-CM | POA: Diagnosis not present

## 2019-11-18 DIAGNOSIS — J3089 Other allergic rhinitis: Secondary | ICD-10-CM | POA: Diagnosis not present

## 2019-11-18 NOTE — Telephone Encounter (Signed)
FYI

## 2019-11-23 ENCOUNTER — Ambulatory Visit
Admission: RE | Admit: 2019-11-23 | Discharge: 2019-11-23 | Disposition: A | Discharge status: Home / Self Care | Payer: PPO | Source: Ambulatory Visit | Attending: Family Medicine | Admitting: Family Medicine

## 2019-11-23 ENCOUNTER — Encounter (INDEPENDENT_AMBULATORY_CARE_PROVIDER_SITE_OTHER): Payer: Self-pay | Admitting: Family Medicine

## 2019-11-23 DIAGNOSIS — K760 Fatty (change of) liver, not elsewhere classified: Secondary | ICD-10-CM | POA: Diagnosis not present

## 2019-11-24 ENCOUNTER — Other Ambulatory Visit (INDEPENDENT_AMBULATORY_CARE_PROVIDER_SITE_OTHER): Payer: Self-pay | Admitting: Family Medicine

## 2019-11-24 DIAGNOSIS — K5909 Other constipation: Secondary | ICD-10-CM

## 2019-11-24 NOTE — Telephone Encounter (Signed)
Please advise. Thanks.  

## 2019-11-25 ENCOUNTER — Other Ambulatory Visit (INDEPENDENT_AMBULATORY_CARE_PROVIDER_SITE_OTHER): Payer: Self-pay | Admitting: Family Medicine

## 2019-11-25 ENCOUNTER — Other Ambulatory Visit: Payer: Self-pay

## 2019-11-25 ENCOUNTER — Encounter (INDEPENDENT_AMBULATORY_CARE_PROVIDER_SITE_OTHER): Payer: Self-pay | Admitting: Family Medicine

## 2019-11-25 ENCOUNTER — Ambulatory Visit (INDEPENDENT_AMBULATORY_CARE_PROVIDER_SITE_OTHER): Payer: PPO | Admitting: Family Medicine

## 2019-11-25 VITALS — BP 139/83 | HR 100 | Temp 97.9°F | Ht 61.0 in | Wt 281.0 lb

## 2019-11-25 DIAGNOSIS — J301 Allergic rhinitis due to pollen: Secondary | ICD-10-CM | POA: Diagnosis not present

## 2019-11-25 DIAGNOSIS — J3081 Allergic rhinitis due to animal (cat) (dog) hair and dander: Secondary | ICD-10-CM | POA: Diagnosis not present

## 2019-11-25 DIAGNOSIS — K760 Fatty (change of) liver, not elsewhere classified: Secondary | ICD-10-CM

## 2019-11-25 DIAGNOSIS — E559 Vitamin D deficiency, unspecified: Secondary | ICD-10-CM | POA: Diagnosis not present

## 2019-11-25 DIAGNOSIS — E1169 Type 2 diabetes mellitus with other specified complication: Secondary | ICD-10-CM | POA: Diagnosis not present

## 2019-11-25 DIAGNOSIS — K5909 Other constipation: Secondary | ICD-10-CM

## 2019-11-25 DIAGNOSIS — Z6841 Body Mass Index (BMI) 40.0 and over, adult: Secondary | ICD-10-CM

## 2019-11-25 DIAGNOSIS — J3089 Other allergic rhinitis: Secondary | ICD-10-CM | POA: Diagnosis not present

## 2019-11-25 MED ORDER — BD PEN NEEDLE NANO 2ND GEN 32G X 4 MM MISC: 1.0000 | Freq: Every day | 0 refills | Status: DC

## 2019-11-25 MED ORDER — OZEMPIC (1 MG/DOSE) 2 MG/1.5ML ~~LOC~~ SOPN: 1.0000 mg | PEN_INJECTOR | SUBCUTANEOUS | 0 refills | Status: DC

## 2019-11-25 NOTE — Progress Notes (Addendum)
Chief Complaint:   OBESITY Virginia Luna is here to discuss her progress with her obesity treatment plan along with follow-up of her obesity related diagnoses. Virginia Luna is on the Category 1 Plan and states she is following her eating plan approximately 50% of the time. Virginia Luna states she is walking/treadmill for 30-60 minutes 3 times per week.  Today's visit was #: 6 Starting weight: 288 lbs Starting date: 08/27/2019 Today's weight: 281 lbs Today's date: 11/25/2019 Total lbs lost to date: 7 lbs Total lbs lost since last in-office visit: 0  Subjective:   1. Type 2 diabetes mellitus with other specified complication, without long-term current use of insulin (HCC) Medications reviewed. Diabetic ROS: no polyuria or polydipsia, no chest pain, dyspnea or TIA's, no numbness, tingling or pain in extremities.   Lab Results  Component Value Date   CREATININE 0.67 10/12/2019   Lab Results  Component Value Date   INSULIN 15.9 08/27/2019   2. Chronic constipation Virginia Luna notes constipation. She is taking Linzess 72 mcg daily.   3. Vitamin D deficiency Virginia Luna's Vitamin D level was 11.2 on 08/27/2019. She is currently taking prescription vitamin D 50,000 IU each week. She denies nausea, vomiting or muscle weakness.  4. Fatty liver Virginia Luna had an ultrasound of the abdomen showing hepatic steatosis on 11/23/19.  Lab Results  Component Value Date   ALT 17 10/12/2019   AST 15 10/12/2019   GGT 87 (H) 10/12/2019   ALKPHOS 154 (H) 10/12/2019   BILITOT 0.3 10/12/2019   Lab Results  Component Value Date   WBC 9.2 10/12/2019   HGB 12.4 10/12/2019   HCT 38.5 10/12/2019   MCV 84 10/12/2019   PLT 450 10/12/2019   Risk factors (3 or more constitute Metabolic Syndrome): waist > 35" for female impaired fasting blood sugar on blood pressure medication.  Assessment/Plan:   1. Type 2 diabetes mellitus with other specified complication, without long-term current use of insulin (HCC) Good blood sugar  control is important to decrease the likelihood of diabetic complications such as nephropathy, neuropathy, limb loss, blindness, coronary artery disease, and death. Intensive lifestyle modification including diet, exercise and weight loss are the first line of treatment for diabetes.   Orders - Semaglutide, 1 MG/DOSE, (OZEMPIC, 1 MG/DOSE,) 2 MG/1.5ML SOPN; Inject 1 mg into the skin once a week.  Dispense: 1 pen; Refill: 0  2. Chronic constipation Virginia Luna was informed that a decrease in bowel movement frequency is normal while losing weight, but stools should not be hard or painful. Orders and follow up as documented in patient record.   Counseling Getting to Good Bowel Health: Your goal is to have one soft bowel movement each day. Drink at least 8 glasses of water each day. Eat plenty of fiber (goal is over 25 grams each day). It is best to get most of your fiber from dietary sources which includes leafy green vegetables, fresh fruit, and whole grains. You may need to add fiber with the help of OTC fiber supplements. These include Metamucil, Citrucel, and Flaxseed. If you are still having trouble, try adding Miralax or Magnesium Citrate. If all of these changes do not work, Cabin crew.  3. Vitamin D deficiency Low Vitamin D level contributes to fatigue and are associated with obesity, breast, and colon cancer. She agrees to continue to take prescription Vitamin D @50 ,000 IU every week and will follow-up for routine testing of Vitamin D, at least 2-3 times per year to avoid over-replacement.  4. Fatty  liver The patient's abdominal ultrasound shows fatty liver. NAFLD: Metabolic risk factors include DM, morbid obesity, and HTN.   NAFLD fibrosis score: -1.47 points Correlated Fibrosis Severity: F0-F2 (no to mild)  There is not an FDA approved medication available yet to treat NAFLD, but treating comorbid conditions plus weight loss will improve NAFLD. LIVER-DIRECTED treatments include:  liraglutide, pioglitazone, and vitamin E.   5. Class 3 severe obesity with serious comorbidity and body mass index (BMI) of 50.0 to 59.9 in adult, unspecified obesity type (HCC) Virginia Luna is currently in the action stage of change. As such, her goal is to continue with weight loss efforts. She has agreed to the Category 2 Plan.   Exercise goals: As is.  Behavioral modification strategies: increasing lean protein intake and increasing water intake.  Virginia Luna has agreed to follow-up with our clinic in 2 weeks. She was informed of the importance of frequent follow-up visits to maximize her success with intensive lifestyle modifications for her multiple health conditions.   Objective:   Blood pressure 139/83, pulse 100, temperature 97.9 F (36.6 C), temperature source Oral, height 5\' 1"  (1.549 m), weight 281 lb (127.5 kg), SpO2 97 %. Body mass index is 53.09 kg/m.  General: Cooperative, alert, well developed, in no acute distress. HEENT: Conjunctivae and lids unremarkable. Cardiovascular: Regular rhythm.  Lungs: Normal work of breathing. Neurologic: No focal deficits.   Lab Results  Component Value Date   CREATININE 0.67 10/12/2019   BUN 12 10/12/2019   NA 141 10/12/2019   K 4.2 10/12/2019   CL 100 10/12/2019   CO2 25 10/12/2019   Lab Results  Component Value Date   ALT 17 10/12/2019   AST 15 10/12/2019   GGT 87 (H) 10/12/2019   ALKPHOS 154 (H) 10/12/2019   BILITOT 0.3 10/12/2019   Lab Results  Component Value Date   INSULIN 15.9 08/27/2019   Lab Results  Component Value Date   TSH 1.490 08/27/2019   Lab Results  Component Value Date   WBC 9.2 10/12/2019   HGB 12.4 10/12/2019   HCT 38.5 10/12/2019   MCV 84 10/12/2019   PLT 450 10/12/2019   Lab Results  Component Value Date   IRON 45 08/27/2019   TIBC 351 08/27/2019   FERRITIN 62 08/27/2019   Attestation Statements:   Reviewed by clinician on day of visit: allergies, medications, problem list, medical history,  surgical history, family history, social history, and previous encounter notes.  I, Water quality scientist, CMA, am acting as Location manager for [employer], DO.  I have reviewed the above documentation for accuracy and completeness, and I agree with the above. Briscoe Deutscher, DO

## 2019-11-26 ENCOUNTER — Encounter (INDEPENDENT_AMBULATORY_CARE_PROVIDER_SITE_OTHER): Payer: Self-pay | Admitting: Family Medicine

## 2019-11-26 ENCOUNTER — Other Ambulatory Visit (INDEPENDENT_AMBULATORY_CARE_PROVIDER_SITE_OTHER): Payer: Self-pay | Admitting: Family Medicine

## 2019-11-26 DIAGNOSIS — K5909 Other constipation: Secondary | ICD-10-CM

## 2019-11-30 ENCOUNTER — Other Ambulatory Visit (INDEPENDENT_AMBULATORY_CARE_PROVIDER_SITE_OTHER): Payer: Self-pay | Admitting: Family Medicine

## 2019-11-30 DIAGNOSIS — E559 Vitamin D deficiency, unspecified: Secondary | ICD-10-CM

## 2019-11-30 NOTE — Telephone Encounter (Signed)
Please advise. Thanks.  

## 2019-12-02 DIAGNOSIS — J3081 Allergic rhinitis due to animal (cat) (dog) hair and dander: Secondary | ICD-10-CM | POA: Diagnosis not present

## 2019-12-02 DIAGNOSIS — J301 Allergic rhinitis due to pollen: Secondary | ICD-10-CM | POA: Diagnosis not present

## 2019-12-02 DIAGNOSIS — J3089 Other allergic rhinitis: Secondary | ICD-10-CM | POA: Diagnosis not present

## 2019-12-08 DIAGNOSIS — J3081 Allergic rhinitis due to animal (cat) (dog) hair and dander: Secondary | ICD-10-CM | POA: Diagnosis not present

## 2019-12-08 DIAGNOSIS — J3089 Other allergic rhinitis: Secondary | ICD-10-CM | POA: Diagnosis not present

## 2019-12-08 DIAGNOSIS — J301 Allergic rhinitis due to pollen: Secondary | ICD-10-CM | POA: Diagnosis not present

## 2019-12-16 DIAGNOSIS — J3089 Other allergic rhinitis: Secondary | ICD-10-CM | POA: Diagnosis not present

## 2019-12-16 DIAGNOSIS — J301 Allergic rhinitis due to pollen: Secondary | ICD-10-CM | POA: Diagnosis not present

## 2019-12-16 DIAGNOSIS — J3081 Allergic rhinitis due to animal (cat) (dog) hair and dander: Secondary | ICD-10-CM | POA: Diagnosis not present

## 2019-12-16 DIAGNOSIS — D649 Anemia, unspecified: Secondary | ICD-10-CM | POA: Diagnosis not present

## 2019-12-16 DIAGNOSIS — I10 Essential (primary) hypertension: Secondary | ICD-10-CM | POA: Diagnosis not present

## 2019-12-16 DIAGNOSIS — Z7984 Long term (current) use of oral hypoglycemic drugs: Secondary | ICD-10-CM | POA: Diagnosis not present

## 2019-12-16 DIAGNOSIS — J4520 Mild intermittent asthma, uncomplicated: Secondary | ICD-10-CM | POA: Diagnosis not present

## 2019-12-16 DIAGNOSIS — E119 Type 2 diabetes mellitus without complications: Secondary | ICD-10-CM | POA: Diagnosis not present

## 2019-12-16 DIAGNOSIS — E7800 Pure hypercholesterolemia, unspecified: Secondary | ICD-10-CM | POA: Diagnosis not present

## 2019-12-21 ENCOUNTER — Encounter (INDEPENDENT_AMBULATORY_CARE_PROVIDER_SITE_OTHER): Payer: Self-pay | Admitting: Family Medicine

## 2019-12-21 ENCOUNTER — Other Ambulatory Visit: Payer: Self-pay

## 2019-12-21 ENCOUNTER — Ambulatory Visit (INDEPENDENT_AMBULATORY_CARE_PROVIDER_SITE_OTHER): Payer: PPO | Admitting: Family Medicine

## 2019-12-21 VITALS — BP 113/81 | HR 95 | Temp 98.9°F | Ht 61.0 in | Wt 276.0 lb

## 2019-12-21 DIAGNOSIS — K760 Fatty (change of) liver, not elsewhere classified: Secondary | ICD-10-CM | POA: Diagnosis not present

## 2019-12-21 DIAGNOSIS — M797 Fibromyalgia: Secondary | ICD-10-CM | POA: Diagnosis not present

## 2019-12-21 DIAGNOSIS — E1169 Type 2 diabetes mellitus with other specified complication: Secondary | ICD-10-CM

## 2019-12-21 DIAGNOSIS — Z6841 Body Mass Index (BMI) 40.0 and over, adult: Secondary | ICD-10-CM

## 2019-12-21 DIAGNOSIS — Z9189 Other specified personal risk factors, not elsewhere classified: Secondary | ICD-10-CM | POA: Diagnosis not present

## 2019-12-21 DIAGNOSIS — E559 Vitamin D deficiency, unspecified: Secondary | ICD-10-CM

## 2019-12-21 MED ORDER — VITAMIN D (ERGOCALCIFEROL) 1.25 MG (50000 UNIT) PO CAPS: 50000.0000 [IU] | ORAL_CAPSULE | ORAL | 0 refills | Status: DC

## 2019-12-21 NOTE — Progress Notes (Signed)
Chief Complaint:   OBESITY Virginia Luna is here to discuss her progress with her obesity treatment plan along with follow-up of her obesity related diagnoses. Virginia Luna is on the Category 1 Plan and states she is following her eating plan approximately 75% of the time. Stirling states she is walking/dancing for 60 minutes 3-4 times per week.  Today's visit was #: 7 Starting weight: 288 lbs Starting date: 08/27/2019 Today's weight: 276 lbs Today's date: 12/21/2019 Total lbs lost to date: 12 lbs Total lbs lost since last in-office visit: 5 lbs  Interim History: Virginia Luna is working with a chronic care pharmacist and will see a dietician.  Her friend is a Clinical research associate and is helping her with meal prep.  Subjective:   1. Vitamin D deficiency Virginia Luna's Vitamin D level was 11.2 on 08/27/2019. She is currently taking prescription vitamin D 50,000 IU each week. She denies nausea, vomiting or muscle weakness.  2. Type 2 diabetes mellitus with other specified complication, without long-term current use of insulin (HCC) Medications reviewed. Diabetic ROS: no polyuria or polydipsia, no chest pain, dyspnea or TIA's, no numbness, tingling or pain in extremities.  She is taking metformin 500 mg daily and Ozempic 1 mg weekly.  Lab Results  Component Value Date   CREATININE 0.67 10/12/2019   Lab Results  Component Value Date   INSULIN 15.9 08/27/2019   3. NAFLD (nonalcoholic fatty liver disease) Hepatic steatosis was shown on ultrasound dated 11/23/2019.  Lab Results  Component Value Date   ALT 17 10/12/2019   AST 15 10/12/2019   GGT 87 (H) 10/12/2019   ALKPHOS 154 (H) 10/12/2019   BILITOT 0.3 10/12/2019   Assessment/Plan:   1. Vitamin D deficiency Low Vitamin D level contributes to fatigue and are associated with obesity, breast, and colon cancer. She agrees to continue to take prescription Vitamin D @50 ,000 IU every week and will follow-up for routine testing of Vitamin D, at least 2-3 times per year  to avoid over-replacement.  Orders - Vitamin D, Ergocalciferol, (DRISDOL) 1.25 MG (50000 UNIT) CAPS capsule; Take 1 capsule (50,000 Units total) by mouth every 7 (seven) days.  Dispense: 4 capsule; Refill: 0  2. Type 2 diabetes mellitus with other specified complication, without long-term current use of insulin (HCC) Good blood sugar control is important to decrease the likelihood of diabetic complications such as nephropathy, neuropathy, limb loss, blindness, coronary artery disease, and death. Intensive lifestyle modification including diet, exercise and weight loss are the first line of treatment for diabetes.   3. NAFLD (nonalcoholic fatty liver disease) We discussed the diagnosis of non-alcoholic fatty liver disease today and how this condition is obesity related. Virginia Luna was educated the importance of weight loss. Virginia Luna agreed to continue with her weight loss efforts with healthier diet and exercise as an essential part of her treatment plan.  4. Fibromyalgia Intensive lifestyle modifications are the first line treatment for this issue. We discussed several lifestyle modifications today and she will continue to work on diet, exercise and weight loss efforts.We will continue to monitor. Orders and follow up as documented in patient record.   Counseling . Try [email or web address]://[email or web address]-[email or web address]/, which is a series of self-care modules designed to teach patients several techniques to manage pain.   5. At risk for heart disease Virginia Luna was given approximately 15 minutes of coronary artery disease prevention counseling today. She is 59 y.o. female and has risk factors for heart disease including obesity. We discussed intensive lifestyle modifications today with an emphasis on  specific weight loss instructions and strategies.   Repetitive spaced learning was employed today to elicit superior memory formation and behavioral change.  6. Class 3 severe obesity with serious comorbidity and body mass  index (BMI) of 50.0 to 59.9 in adult, unspecified obesity type (HCC) Virginia Luna is currently in the action stage of change. As such, her goal is to continue with weight loss efforts. She has agreed to the Category 1 Plan.   Exercise goals: As is.  Behavioral modification strategies: increasing lean protein intake and increasing water intake.  Virginia Luna has agreed to follow-up with our clinic in 2 weeks. She was informed of the importance of frequent follow-up visits to maximize her success with intensive lifestyle modifications for her multiple health conditions.   Objective:   Blood pressure 113/81, pulse 95, temperature 98.9 F (37.2 C), temperature source Oral, height 5\' 1"  (1.549 m), weight 276 lb (125.2 kg), SpO2 100 %. Body mass index is 52.15 kg/m.  General: Cooperative, alert, well developed, in no acute distress. HEENT: Conjunctivae and lids unremarkable. Cardiovascular: Regular rhythm.  Lungs: Normal work of breathing. Neurologic: No focal deficits.   Lab Results  Component Value Date   CREATININE 0.67 10/12/2019   BUN 12 10/12/2019   NA 141 10/12/2019   K 4.2 10/12/2019   CL 100 10/12/2019   CO2 25 10/12/2019   Lab Results  Component Value Date   ALT 17 10/12/2019   AST 15 10/12/2019   GGT 87 (H) 10/12/2019   ALKPHOS 154 (H) 10/12/2019   BILITOT 0.3 10/12/2019   Lab Results  Component Value Date   INSULIN 15.9 08/27/2019   Lab Results  Component Value Date   TSH 1.490 08/27/2019   Lab Results  Component Value Date   WBC 9.2 10/12/2019   HGB 12.4 10/12/2019   HCT 38.5 10/12/2019   MCV 84 10/12/2019   PLT 450 10/12/2019   Lab Results  Component Value Date   IRON 45 08/27/2019   TIBC 351 08/27/2019   FERRITIN 62 08/27/2019   Attestation Statements:   Reviewed by clinician on day of visit: allergies, medications, problem list, medical history, surgical history, family history, social history, and previous encounter notes.  I, Water quality scientist, CMA, am  acting as Location manager for [employer], DO.  I have reviewed the above documentation for accuracy and completeness, and I agree with the above. Briscoe Deutscher, DO

## 2019-12-23 DIAGNOSIS — J301 Allergic rhinitis due to pollen: Secondary | ICD-10-CM | POA: Diagnosis not present

## 2019-12-23 DIAGNOSIS — J3089 Other allergic rhinitis: Secondary | ICD-10-CM | POA: Diagnosis not present

## 2019-12-23 DIAGNOSIS — J3081 Allergic rhinitis due to animal (cat) (dog) hair and dander: Secondary | ICD-10-CM | POA: Diagnosis not present

## 2019-12-24 ENCOUNTER — Other Ambulatory Visit (INDEPENDENT_AMBULATORY_CARE_PROVIDER_SITE_OTHER): Payer: Self-pay | Admitting: Family Medicine

## 2019-12-24 DIAGNOSIS — K5909 Other constipation: Secondary | ICD-10-CM

## 2019-12-29 ENCOUNTER — Other Ambulatory Visit (INDEPENDENT_AMBULATORY_CARE_PROVIDER_SITE_OTHER): Payer: Self-pay | Admitting: Family Medicine

## 2019-12-29 DIAGNOSIS — E1169 Type 2 diabetes mellitus with other specified complication: Secondary | ICD-10-CM

## 2019-12-30 DIAGNOSIS — J3089 Other allergic rhinitis: Secondary | ICD-10-CM | POA: Diagnosis not present

## 2019-12-30 DIAGNOSIS — J3081 Allergic rhinitis due to animal (cat) (dog) hair and dander: Secondary | ICD-10-CM | POA: Diagnosis not present

## 2019-12-30 DIAGNOSIS — J301 Allergic rhinitis due to pollen: Secondary | ICD-10-CM | POA: Diagnosis not present

## 2020-01-05 DIAGNOSIS — I10 Essential (primary) hypertension: Secondary | ICD-10-CM | POA: Diagnosis not present

## 2020-01-06 DIAGNOSIS — J301 Allergic rhinitis due to pollen: Secondary | ICD-10-CM | POA: Diagnosis not present

## 2020-01-06 DIAGNOSIS — J3081 Allergic rhinitis due to animal (cat) (dog) hair and dander: Secondary | ICD-10-CM | POA: Diagnosis not present

## 2020-01-06 DIAGNOSIS — J3089 Other allergic rhinitis: Secondary | ICD-10-CM | POA: Diagnosis not present

## 2020-01-11 ENCOUNTER — Other Ambulatory Visit: Payer: Self-pay

## 2020-01-11 ENCOUNTER — Ambulatory Visit (INDEPENDENT_AMBULATORY_CARE_PROVIDER_SITE_OTHER): Payer: PPO | Admitting: Family Medicine

## 2020-01-11 ENCOUNTER — Encounter (INDEPENDENT_AMBULATORY_CARE_PROVIDER_SITE_OTHER): Payer: Self-pay | Admitting: Family Medicine

## 2020-01-11 VITALS — BP 137/88 | HR 99 | Temp 98.5°F | Ht 61.0 in | Wt 275.0 lb

## 2020-01-11 DIAGNOSIS — K760 Fatty (change of) liver, not elsewhere classified: Secondary | ICD-10-CM

## 2020-01-11 DIAGNOSIS — E559 Vitamin D deficiency, unspecified: Secondary | ICD-10-CM

## 2020-01-11 DIAGNOSIS — E1169 Type 2 diabetes mellitus with other specified complication: Secondary | ICD-10-CM

## 2020-01-11 DIAGNOSIS — Z6841 Body Mass Index (BMI) 40.0 and over, adult: Secondary | ICD-10-CM

## 2020-01-12 NOTE — Progress Notes (Signed)
Chief Complaint:   OBESITY Virginia Luna is here to discuss her progress with her obesity treatment plan along with follow-up of her obesity related diagnoses. Virginia Luna is on the Category 1 Plan and states she is following her eating plan approximately 75% of the time. Virginia Luna states she is walking for 40-60 minutes 4-5 times per week.  Today's visit was #: 8 Starting weight: 288 lbs Starting date: 08/27/2019 Today's weight: 275 lbs Today's date: 10/14/2019 Total lbs lost to date: 13 lbs Total lbs lost since last in-office visit: 1 lb  Interim History: Virginia Luna says she could not eat for a week due to vomiting and diarrhea.  She is roasting vegetables now and taking her multivitamin with food.  She is feeling better.  Subjective:   1. Type 2 diabetes mellitus with other specified complication, without long-term current use of insulin (HCC) Medications reviewed. Diabetic ROS: no polyuria or polydipsia, no chest pain, dyspnea or TIA's, no numbness, tingling or pain in extremities.  She is taking metformin 500 mg daily and Ozempic.  Lab Results  Component Value Date   CREATININE 0.67 10/12/2019   Lab Results  Component Value Date   INSULIN 15.9 08/27/2019   2. NAFLD (nonalcoholic fatty liver disease) Hepatic steatosis was shown on ultrasound dated 11/23/2019.  3. Vitamin D deficiency Virginia Luna's Vitamin D level was 11.2 on 08/27/2019. She is currently taking prescription vitamin D 50,000 IU each week. She denies nausea, vomiting or muscle weakness.  Assessment/Plan:   1. Type 2 diabetes mellitus with other specified complication, without long-term current use of insulin (HCC) Good blood sugar control is important to decrease the likelihood of diabetic complications such as nephropathy, neuropathy, limb loss, blindness, coronary artery disease, and death. Intensive lifestyle modification including diet, exercise and weight loss are the first line of treatment for diabetes.   2. NAFLD  (nonalcoholic fatty liver disease) We discussed the likely diagnosis of non-alcoholic fatty liver disease today and how this condition is obesity related. Cattie was educated the importance of weight loss. Virginia Luna agreed to continue with her weight loss efforts with healthier diet and exercise as an essential part of her treatment plan.  3. Vitamin D deficiency Low Vitamin D level contributes to fatigue and are associated with obesity, breast, and colon cancer. She agrees to continue to take prescription Vitamin D @50 ,000 IU every week and will follow-up for routine testing of Vitamin D, at least 2-3 times per year to avoid over-replacement.  4. Class 3 severe obesity with serious comorbidity and body mass index (BMI) of 50.0 to 59.9 in adult, unspecified obesity type (HCC) [name] is currently in the action stage of change. As such, her goal is to continue with weight loss efforts. She has agreed to the Category 1 Plan.   Exercise goals: For substantial health benefits, adults should do at least 150 minutes (2 hours and 30 minutes) a week of moderate-intensity, or 75 minutes (1 hour and 15 minutes) a week of vigorous-intensity aerobic physical activity, or an equivalent combination of moderate- and vigorous-intensity aerobic activity. Aerobic activity should be performed in episodes of at least 10 minutes, and preferably, it should be spread throughout the week.  Behavioral modification strategies: increasing lean protein intake and increasing water intake.  Virginia Luna has agreed to follow-up with our clinic in 2 weeks. She was informed of the importance of frequent follow-up visits to maximize her success with intensive lifestyle modifications for her multiple health conditions.   Objective:   Blood pressure 137/88, pulse 99,  temperature 98.5 F (36.9 C), temperature source Oral, height 5\' 1"  (1.549 m), weight 275 lb (124.7 kg), SpO2 97 %. Body mass index is 51.96 kg/m.  General: Cooperative,  alert, well developed, in no acute distress. HEENT: Conjunctivae and lids unremarkable. Cardiovascular: Regular rhythm.  Lungs: Normal work of breathing. Neurologic: No focal deficits.   Lab Results  Component Value Date   CREATININE 0.67 10/12/2019   BUN 12 10/12/2019   NA 141 10/12/2019   K 4.2 10/12/2019   CL 100 10/12/2019   CO2 25 10/12/2019   Lab Results  Component Value Date   ALT 17 10/12/2019   AST 15 10/12/2019   GGT 87 (H) 10/12/2019   ALKPHOS 154 (H) 10/12/2019   BILITOT 0.3 10/12/2019   Lab Results  Component Value Date   INSULIN 15.9 08/27/2019   Lab Results  Component Value Date   TSH 1.490 08/27/2019   Lab Results  Component Value Date   WBC 9.2 10/12/2019   HGB 12.4 10/12/2019   HCT 38.5 10/12/2019   MCV 84 10/12/2019   PLT 450 10/12/2019   Lab Results  Component Value Date   IRON 45 08/27/2019   TIBC 351 08/27/2019   FERRITIN 62 08/27/2019   Attestation Statements:   Reviewed by clinician on day of visit: allergies, medications, problem list, medical history, surgical history, family history, social history, and previous encounter notes.  Time spent on visit including pre-visit chart review and post-visit care and charting was 26 minutes.   I, Water quality scientist, CMA, am acting as Location manager for [employer], DO.  I have reviewed the above documentation for accuracy and completeness, and I agree with the above. Briscoe Deutscher, DO

## 2020-01-13 DIAGNOSIS — J301 Allergic rhinitis due to pollen: Secondary | ICD-10-CM | POA: Diagnosis not present

## 2020-01-13 DIAGNOSIS — J3089 Other allergic rhinitis: Secondary | ICD-10-CM | POA: Diagnosis not present

## 2020-01-13 DIAGNOSIS — J3081 Allergic rhinitis due to animal (cat) (dog) hair and dander: Secondary | ICD-10-CM | POA: Diagnosis not present

## 2020-01-14 DIAGNOSIS — J301 Allergic rhinitis due to pollen: Secondary | ICD-10-CM | POA: Diagnosis not present

## 2020-01-14 DIAGNOSIS — J3081 Allergic rhinitis due to animal (cat) (dog) hair and dander: Secondary | ICD-10-CM | POA: Diagnosis not present

## 2020-01-14 DIAGNOSIS — J3089 Other allergic rhinitis: Secondary | ICD-10-CM | POA: Diagnosis not present

## 2020-01-15 DIAGNOSIS — D649 Anemia, unspecified: Secondary | ICD-10-CM | POA: Diagnosis not present

## 2020-01-15 DIAGNOSIS — J4520 Mild intermittent asthma, uncomplicated: Secondary | ICD-10-CM | POA: Diagnosis not present

## 2020-01-15 DIAGNOSIS — I10 Essential (primary) hypertension: Secondary | ICD-10-CM | POA: Diagnosis not present

## 2020-01-15 DIAGNOSIS — E7800 Pure hypercholesterolemia, unspecified: Secondary | ICD-10-CM | POA: Diagnosis not present

## 2020-01-15 DIAGNOSIS — E119 Type 2 diabetes mellitus without complications: Secondary | ICD-10-CM | POA: Diagnosis not present

## 2020-01-20 DIAGNOSIS — J301 Allergic rhinitis due to pollen: Secondary | ICD-10-CM | POA: Diagnosis not present

## 2020-01-20 DIAGNOSIS — J3089 Other allergic rhinitis: Secondary | ICD-10-CM | POA: Diagnosis not present

## 2020-01-20 DIAGNOSIS — J3081 Allergic rhinitis due to animal (cat) (dog) hair and dander: Secondary | ICD-10-CM | POA: Diagnosis not present

## 2020-01-25 DIAGNOSIS — J301 Allergic rhinitis due to pollen: Secondary | ICD-10-CM | POA: Diagnosis not present

## 2020-01-25 DIAGNOSIS — J4540 Moderate persistent asthma, uncomplicated: Secondary | ICD-10-CM | POA: Diagnosis not present

## 2020-01-25 DIAGNOSIS — J3081 Allergic rhinitis due to animal (cat) (dog) hair and dander: Secondary | ICD-10-CM | POA: Diagnosis not present

## 2020-01-25 DIAGNOSIS — J3089 Other allergic rhinitis: Secondary | ICD-10-CM | POA: Diagnosis not present

## 2020-02-03 DIAGNOSIS — J301 Allergic rhinitis due to pollen: Secondary | ICD-10-CM | POA: Diagnosis not present

## 2020-02-03 DIAGNOSIS — J3089 Other allergic rhinitis: Secondary | ICD-10-CM | POA: Diagnosis not present

## 2020-02-03 DIAGNOSIS — J3081 Allergic rhinitis due to animal (cat) (dog) hair and dander: Secondary | ICD-10-CM | POA: Diagnosis not present

## 2020-02-04 DIAGNOSIS — E119 Type 2 diabetes mellitus without complications: Secondary | ICD-10-CM | POA: Diagnosis not present

## 2020-02-04 DIAGNOSIS — G43909 Migraine, unspecified, not intractable, without status migrainosus: Secondary | ICD-10-CM | POA: Diagnosis not present

## 2020-02-04 DIAGNOSIS — G894 Chronic pain syndrome: Secondary | ICD-10-CM | POA: Diagnosis not present

## 2020-02-04 DIAGNOSIS — J4520 Mild intermittent asthma, uncomplicated: Secondary | ICD-10-CM | POA: Diagnosis not present

## 2020-02-04 DIAGNOSIS — E7800 Pure hypercholesterolemia, unspecified: Secondary | ICD-10-CM | POA: Diagnosis not present

## 2020-02-04 DIAGNOSIS — K219 Gastro-esophageal reflux disease without esophagitis: Secondary | ICD-10-CM | POA: Diagnosis not present

## 2020-02-04 DIAGNOSIS — Z7984 Long term (current) use of oral hypoglycemic drugs: Secondary | ICD-10-CM | POA: Diagnosis not present

## 2020-02-04 DIAGNOSIS — M797 Fibromyalgia: Secondary | ICD-10-CM | POA: Diagnosis not present

## 2020-02-04 DIAGNOSIS — I10 Essential (primary) hypertension: Secondary | ICD-10-CM | POA: Diagnosis not present

## 2020-02-04 DIAGNOSIS — R600 Localized edema: Secondary | ICD-10-CM | POA: Diagnosis not present

## 2020-02-04 DIAGNOSIS — R42 Dizziness and giddiness: Secondary | ICD-10-CM | POA: Diagnosis not present

## 2020-02-08 ENCOUNTER — Ambulatory Visit (INDEPENDENT_AMBULATORY_CARE_PROVIDER_SITE_OTHER): Payer: PPO | Admitting: Family Medicine

## 2020-02-08 ENCOUNTER — Encounter (INDEPENDENT_AMBULATORY_CARE_PROVIDER_SITE_OTHER): Payer: Self-pay | Admitting: Family Medicine

## 2020-02-08 ENCOUNTER — Other Ambulatory Visit: Payer: Self-pay

## 2020-02-08 VITALS — BP 124/82 | HR 100 | Temp 98.1°F | Ht 61.0 in | Wt 270.0 lb

## 2020-02-08 DIAGNOSIS — E785 Hyperlipidemia, unspecified: Secondary | ICD-10-CM

## 2020-02-08 DIAGNOSIS — K760 Fatty (change of) liver, not elsewhere classified: Secondary | ICD-10-CM

## 2020-02-08 DIAGNOSIS — E1159 Type 2 diabetes mellitus with other circulatory complications: Secondary | ICD-10-CM

## 2020-02-08 DIAGNOSIS — E1169 Type 2 diabetes mellitus with other specified complication: Secondary | ICD-10-CM | POA: Diagnosis not present

## 2020-02-08 DIAGNOSIS — Z6841 Body Mass Index (BMI) 40.0 and over, adult: Secondary | ICD-10-CM | POA: Diagnosis not present

## 2020-02-08 DIAGNOSIS — I10 Essential (primary) hypertension: Secondary | ICD-10-CM | POA: Diagnosis not present

## 2020-02-08 DIAGNOSIS — E559 Vitamin D deficiency, unspecified: Secondary | ICD-10-CM

## 2020-02-08 MED ORDER — VITAMIN D (ERGOCALCIFEROL) 1.25 MG (50000 UNIT) PO CAPS: 50000.0000 [IU] | ORAL_CAPSULE | ORAL | 0 refills | Status: DC

## 2020-02-08 NOTE — Progress Notes (Signed)
Chief Complaint:   OBESITY Virginia Luna is here to discuss her progress with her obesity treatment plan along with follow-up of her obesity related diagnoses. Virginia Luna is on the Category 1 Plan and states she is following her eating plan approximately 80% of the time. Virginia Luna states she is doing strength training and cardio 3-4 times per week.  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  She endorses increased hunger when she is off Ozempic.  She is eating more protein, vegetables, and fruits.  She says she has been exercising well.  Subjective:   1. NAFLD (nonalcoholic fatty liver disease) Virginia Luna has the diagnosis of NAFLD. Her BMI is over 40. She denies abdominal pain or jaundice. She denies excessive alcohol intake.  Lab Results  Component Value Date   ALT 17 10/12/2019   AST 15 10/12/2019   GGT 87 (H) 10/12/2019   ALKPHOS 154 (H) 10/12/2019   BILITOT 0.3 10/12/2019   2. Type 2 diabetes mellitus with other specified complication, without long-term current use of insulin (HCC) Medications reviewed. Diabetic ROS: no polyuria or polydipsia, no chest pain, dyspnea or TIA's, no numbness, tingling or pain in extremities.   Lab Results  Component Value Date   CREATININE 0.67 10/12/2019   Lab Results  Component Value Date   INSULIN 15.9 08/27/2019   3. Hypertension associated with diabetes ([place]ia Luna) Review: taking medications as instructed, no medication side effects noted, no chest pain on exertion, no dyspnea on exertion, no swelling of ankles.   BP Readings from Last 3 Encounters:  02/08/20 124/82  01/11/20 137/88  12/21/19 113/81   4. Hyperlipidemia associated with type 2 diabetes mellitus ([place]ia Luna) Virginia Luna has hyperlipidemia and has been  trying to improve her cholesterol levels with intensive lifestyle modification including a low saturated fat diet, exercise and weight loss. She denies any chest pain, claudication or myalgias.  Lab Results  Component Value Date   ALT 17 10/12/2019   AST 15 10/12/2019   GGT 87 (H) 10/12/2019   ALKPHOS 154 (H) 10/12/2019   BILITOT 0.3 10/12/2019   5. Vitamin D deficiency Virginia Luna Vitamin D level was 11.2 on 08/27/2019. She is currently taking prescription vitamin D 50,000 IU each week. She denies nausea, vomiting or muscle weakness.  Assessment/Plan:   1. NAFLD (nonalcoholic fatty liver disease) We discussed diagnosis of non-alcoholic fatty liver disease today and how this condition is obesity related. Virginia Luna was educated the importance of weight loss. Virginia Luna agreed to continue with her weight loss efforts with healthier diet and exercise as an essential part of her treatment plan.  Orders - Anemia panel - CBC with Differential/Platelet - Comprehensive metabolic panel - T3 - T4, free - TSH - Gamma GT  2. Type 2 diabetes mellitus with other specified complication, without long-term current use of insulin (HCC) Good blood sugar control is important to decrease the likelihood of diabetic complications such as nephropathy, neuropathy, limb loss, blindness, coronary artery disease, and death. Intensive lifestyle modification including diet, exercise and weight loss are the first line of treatment for diabetes.   Orders - Hemoglobin A1c - Insulin, random - Urine Microalbumin w/creat. ratio  3. Hypertension associated with diabetes (Virginia Luna) Virginia Luna is working on healthy weight loss and exercise to improve blood pressure control.  We will watch for signs of hypotension as she continues her lifestyle modifications.  Orders - Anemia panel - CBC with Differential/Platelet - Comprehensive metabolic panel - T3 - T4, free - TSH  4. Hyperlipidemia associated with type 2 diabetes mellitus  (Virginia Luna) Cardiovascular risk and specific lipid/LDL goals reviewed.  We discussed several lifestyle modifications today and Virginia Luna will continue to work on diet, exercise and weight loss efforts. Orders and follow up as documented in patient record.   Counseling Intensive lifestyle modifications are the first line treatment for this issue. . Dietary changes: Increase soluble fiber. Decrease simple carbohydrates. . Exercise changes: Moderate to vigorous-intensity aerobic activity 150 minutes per week if tolerated. . Lipid-lowering medications: see documented in medical record. - Anemia panel - CBC with Differential/Platelet - Comprehensive metabolic panel - Lipid Panel With LDL/HDL Ratio  5. Vitamin D deficiency Low Vitamin D level contributes to fatigue and are associated with obesity, breast, and colon cancer. She agrees to continue to take prescription Vitamin D @50 ,000 IU every week and will follow-up for routine testing of Vitamin D, at least 2-3 times per year to avoid over-replacement.  Orders - Vitamin D, Ergocalciferol, (DRISDOL) 1.25 MG (50000 UNIT) CAPS capsule; Take 1 capsule (50,000 Units total) by mouth every 7 (seven) days.  Dispense: 4 capsule; Refill: 0 - VITAMIN D 25 Hydroxy (Vit-D Deficiency, Fractures)  6. Class 3 severe obesity with serious comorbidity and body mass index (BMI) of 50.0 to 59.9 in adult, unspecified obesity type (HCC) Virginia Luna is currently in the action stage of change. As such, her goal is to continue with weight loss efforts. She has agreed to the Category 1 Plan.   Exercise goals: For substantial health benefits, adults should do at least 150 minutes (2 hours and 30 minutes) a week of moderate-intensity, or 75 minutes (1 hour and 15 minutes) a week of vigorous-intensity aerobic physical activity, or an equivalent combination of moderate- and vigorous-intensity aerobic activity. Aerobic activity should be performed in episodes of at least 10 minutes, and  preferably, it should be spread throughout the week.  Behavioral modification strategies: increasing lean protein intake.  Virginia Luna has agreed to follow-up with our clinic in 4 weeks. She was informed of the importance of frequent follow-up visits to maximize her success with intensive lifestyle modifications for her multiple health conditions.   Virginia Luna was informed we would discuss her lab results at her next visit unless there is a critical issue that needs to be addressed sooner. Virginia Luna agreed to keep her next visit at the agreed upon time to discuss these results.  Objective:   Blood pressure 124/82, pulse 100, temperature 98.1 F (36.7 C), temperature source Oral, height 5\' 1"  (1.549 m), weight 270 lb (122.5 kg), SpO2 95 %. Body mass index is 51.02 kg/m.  General: Cooperative, alert, well developed, in no acute distress. HEENT: Conjunctivae and lids unremarkable. Cardiovascular: Regular rhythm.  Lungs: Normal work of breathing. Neurologic: No focal deficits.   Lab Results  Component Value Date   CREATININE 0.67 10/12/2019   BUN 12 10/12/2019   NA 141 10/12/2019   K 4.2 10/12/2019   CL 100 10/12/2019   CO2 25 10/12/2019   Lab Results  Component Value Date   ALT 17 10/12/2019   AST 15 10/12/2019   GGT 87 (H) 10/12/2019   ALKPHOS 154 (H) 10/12/2019   BILITOT 0.3 10/12/2019   Lab Results  Component Value Date   INSULIN 15.9 08/27/2019   Lab Results  Component Value Date  TSH 1.490 08/27/2019   Lab Results  Component Value Date   WBC 9.2 10/12/2019   HGB 12.4 10/12/2019   HCT 38.5 10/12/2019   MCV 84 10/12/2019   PLT 450 10/12/2019   Lab Results  Component Value Date   IRON 45 08/27/2019   TIBC 351 08/27/2019   FERRITIN 62 08/27/2019   Attestation Statements:   Reviewed by clinician on day of visit: allergies, medications, problem list, medical history, surgical history, family history, social history, and previous encounter notes.  I, Water quality scientist,  CMA, am acting as transcriptionist for Briscoe Deutscher, DO  I have reviewed the above documentation for accuracy and completeness, and I agree with the above. Briscoe Deutscher, DO

## 2020-02-09 LAB — COMPREHENSIVE METABOLIC PANEL
ALT: 21 IU/L (ref 0–32)
AST: 12 IU/L (ref 0–40)
Albumin/Globulin Ratio: 1.7 (ref 1.2–2.2)
Albumin: 4.8 g/dL (ref 3.8–4.9)
Alkaline Phosphatase: 140 IU/L — ABNORMAL HIGH (ref 48–121)
BUN/Creatinine Ratio: 29 — ABNORMAL HIGH (ref 9–23)
BUN: 20 mg/dL (ref 6–24)
Bilirubin Total: 0.2 mg/dL (ref 0.0–1.2)
CO2: 25 mmol/L (ref 20–29)
Calcium: 9.6 mg/dL (ref 8.7–10.2)
Chloride: 100 mmol/L (ref 96–106)
Creatinine, Ser: 0.7 mg/dL (ref 0.57–1.00)
GFR calc Af Amer: 111 mL/min/{1.73_m2} (ref >59)
GFR calc non Af Amer: 96 mL/min/{1.73_m2} (ref >59)
Globulin, Total: 2.9 g/dL (ref 1.5–4.5)
Glucose: 94 mg/dL (ref 65–99)
Potassium: 4.1 mmol/L (ref 3.5–5.2)
Sodium: 144 mmol/L (ref 134–144)
Total Protein: 7.7 g/dL (ref 6.0–8.5)

## 2020-02-09 LAB — LIPID PANEL WITH LDL/HDL RATIO
Cholesterol, Total: 176 mg/dL (ref 100–199)
HDL: 64 mg/dL (ref >39)
LDL Chol Calc (NIH): 91 mg/dL (ref 0–99)
LDL/HDL Ratio: 1.4 ratio (ref 0.0–3.2)
Triglycerides: 121 mg/dL (ref 0–149)
VLDL Cholesterol Cal: 21 mg/dL (ref 5–40)

## 2020-02-09 LAB — ANEMIA PANEL
Ferritin: 70 ng/mL (ref 15–150)
Folate, Hemolysate: 325 ng/mL
Folate, RBC: 862 ng/mL (ref >498)
Hematocrit: 37.7 % (ref 34.0–46.6)
Iron Saturation: 12 % — ABNORMAL LOW (ref 15–55)
Iron: 46 ug/dL (ref 27–159)
Retic Ct Pct: 1.2 % (ref 0.6–2.6)
Total Iron Binding Capacity: 378 ug/dL (ref 250–450)
UIBC: 332 ug/dL (ref 131–425)
Vitamin B-12: 1399 pg/mL — ABNORMAL HIGH (ref 232–1245)

## 2020-02-09 LAB — CBC WITH DIFFERENTIAL/PLATELET
Basophils Absolute: 0.1 10*3/uL (ref 0.0–0.2)
Basos: 1 %
EOS (ABSOLUTE): 0.3 10*3/uL (ref 0.0–0.4)
Eos: 2 %
Hemoglobin: 11.8 g/dL (ref 11.1–15.9)
Immature Grans (Abs): 0.1 10*3/uL (ref 0.0–0.1)
Immature Granulocytes: 1 %
Lymphocytes Absolute: 2.3 10*3/uL (ref 0.7–3.1)
Lymphs: 18 %
MCH: 26.5 pg — ABNORMAL LOW (ref 26.6–33.0)
MCHC: 31.3 g/dL — ABNORMAL LOW (ref 31.5–35.7)
MCV: 85 fL (ref 79–97)
Monocytes Absolute: 0.5 10*3/uL (ref 0.1–0.9)
Monocytes: 4 %
Neutrophils Absolute: 9.2 10*3/uL — ABNORMAL HIGH (ref 1.4–7.0)
Neutrophils: 74 %
Platelets: 501 10*3/uL — ABNORMAL HIGH (ref 150–450)
RBC: 4.46 x10E6/uL (ref 3.77–5.28)
RDW: 15.8 % — ABNORMAL HIGH (ref 11.7–15.4)
WBC: 12.3 10*3/uL — ABNORMAL HIGH (ref 3.4–10.8)

## 2020-02-09 LAB — TSH: TSH: 1.6 u[IU]/mL (ref 0.450–4.500)

## 2020-02-09 LAB — HEMOGLOBIN A1C
Est. average glucose Bld gHb Est-mCnc: 137 mg/dL
Hgb A1c MFr Bld: 6.4 % — ABNORMAL HIGH (ref 4.8–5.6)

## 2020-02-09 LAB — VITAMIN D 25 HYDROXY (VIT D DEFICIENCY, FRACTURES): Vit D, 25-Hydroxy: 31.1 ng/mL (ref 30.0–100.0)

## 2020-02-09 LAB — T4, FREE: Free T4: 1.3 ng/dL (ref 0.82–1.77)

## 2020-02-09 LAB — MICROALBUMIN / CREATININE URINE RATIO
Creatinine, Urine: 11.4 mg/dL
Microalb/Creat Ratio: <26 mg/g creat (ref 0–29)
Microalbumin, Urine: <3 ug/mL

## 2020-02-09 LAB — GAMMA GT: GGT: 75 IU/L — ABNORMAL HIGH (ref 0–60)

## 2020-02-09 LAB — INSULIN, RANDOM: INSULIN: 18.5 u[IU]/mL (ref 2.6–24.9)

## 2020-02-09 LAB — T3: T3, Total: 75 ng/dL (ref 71–180)

## 2020-02-10 DIAGNOSIS — J3081 Allergic rhinitis due to animal (cat) (dog) hair and dander: Secondary | ICD-10-CM | POA: Diagnosis not present

## 2020-02-10 DIAGNOSIS — J301 Allergic rhinitis due to pollen: Secondary | ICD-10-CM | POA: Diagnosis not present

## 2020-02-10 DIAGNOSIS — J3089 Other allergic rhinitis: Secondary | ICD-10-CM | POA: Diagnosis not present

## 2020-02-17 DIAGNOSIS — J4520 Mild intermittent asthma, uncomplicated: Secondary | ICD-10-CM | POA: Diagnosis not present

## 2020-02-17 DIAGNOSIS — J3089 Other allergic rhinitis: Secondary | ICD-10-CM | POA: Diagnosis not present

## 2020-02-17 DIAGNOSIS — J301 Allergic rhinitis due to pollen: Secondary | ICD-10-CM | POA: Diagnosis not present

## 2020-02-17 DIAGNOSIS — I10 Essential (primary) hypertension: Secondary | ICD-10-CM | POA: Diagnosis not present

## 2020-02-17 DIAGNOSIS — J3081 Allergic rhinitis due to animal (cat) (dog) hair and dander: Secondary | ICD-10-CM | POA: Diagnosis not present

## 2020-02-17 DIAGNOSIS — E119 Type 2 diabetes mellitus without complications: Secondary | ICD-10-CM | POA: Diagnosis not present

## 2020-02-17 DIAGNOSIS — E7800 Pure hypercholesterolemia, unspecified: Secondary | ICD-10-CM | POA: Diagnosis not present

## 2020-02-17 DIAGNOSIS — D649 Anemia, unspecified: Secondary | ICD-10-CM | POA: Diagnosis not present

## 2020-02-24 ENCOUNTER — Ambulatory Visit (INDEPENDENT_AMBULATORY_CARE_PROVIDER_SITE_OTHER): Payer: PPO | Admitting: Family Medicine

## 2020-02-24 DIAGNOSIS — J301 Allergic rhinitis due to pollen: Secondary | ICD-10-CM | POA: Diagnosis not present

## 2020-02-24 DIAGNOSIS — J3089 Other allergic rhinitis: Secondary | ICD-10-CM | POA: Diagnosis not present

## 2020-02-24 DIAGNOSIS — J3081 Allergic rhinitis due to animal (cat) (dog) hair and dander: Secondary | ICD-10-CM | POA: Diagnosis not present

## 2020-02-25 DIAGNOSIS — E119 Type 2 diabetes mellitus without complications: Secondary | ICD-10-CM | POA: Diagnosis not present

## 2020-02-25 DIAGNOSIS — D649 Anemia, unspecified: Secondary | ICD-10-CM | POA: Diagnosis not present

## 2020-02-25 DIAGNOSIS — J4520 Mild intermittent asthma, uncomplicated: Secondary | ICD-10-CM | POA: Diagnosis not present

## 2020-02-25 DIAGNOSIS — I10 Essential (primary) hypertension: Secondary | ICD-10-CM | POA: Diagnosis not present

## 2020-02-25 DIAGNOSIS — E7800 Pure hypercholesterolemia, unspecified: Secondary | ICD-10-CM | POA: Diagnosis not present

## 2020-03-02 ENCOUNTER — Ambulatory Visit (INDEPENDENT_AMBULATORY_CARE_PROVIDER_SITE_OTHER): Payer: PPO | Admitting: Family Medicine

## 2020-03-02 ENCOUNTER — Encounter (INDEPENDENT_AMBULATORY_CARE_PROVIDER_SITE_OTHER): Payer: Self-pay | Admitting: Family Medicine

## 2020-03-02 ENCOUNTER — Other Ambulatory Visit: Payer: Self-pay

## 2020-03-02 VITALS — BP 122/77 | HR 96 | Temp 97.6°F | Ht 61.0 in | Wt 280.0 lb

## 2020-03-02 DIAGNOSIS — Z6841 Body Mass Index (BMI) 40.0 and over, adult: Secondary | ICD-10-CM | POA: Diagnosis not present

## 2020-03-02 DIAGNOSIS — E559 Vitamin D deficiency, unspecified: Secondary | ICD-10-CM

## 2020-03-02 DIAGNOSIS — E1169 Type 2 diabetes mellitus with other specified complication: Secondary | ICD-10-CM | POA: Diagnosis not present

## 2020-03-02 DIAGNOSIS — J3081 Allergic rhinitis due to animal (cat) (dog) hair and dander: Secondary | ICD-10-CM | POA: Diagnosis not present

## 2020-03-02 DIAGNOSIS — J3089 Other allergic rhinitis: Secondary | ICD-10-CM | POA: Diagnosis not present

## 2020-03-02 MED ORDER — VITAMIN D (ERGOCALCIFEROL) 1.25 MG (50000 UNIT) PO CAPS: 50000.0000 [IU] | ORAL_CAPSULE | ORAL | 0 refills | Status: DC

## 2020-03-03 NOTE — Progress Notes (Signed)
Chief Complaint:   OBESITY Virginia Luna is here to discuss her progress with her obesity treatment plan along with follow-up of her obesity related diagnoses. Virginia Luna is on the Category 1 Plan and states she is following her eating plan approximately 20-30% of the time. Virginia Luna states she has been walking on the beach for exercise.  Today's visit was #: 10 Starting weight: 288 lbs Starting date: 08/27/2019 Today's weight: 280 lbs Today's date: 03/02/2020 Total lbs lost to date: 8 lbs Total lbs lost since last in-office visit: 0  Interim History: Rhiannon says she is still happy with Category 1.  Ozempic was approved and she is expecting it next week.  She enjoyed her birthday and beach trip.  She says she indulged while on the trip.  Subjective:   1. Vitamin D deficiency Virginia Luna's Vitamin D level was 31.1 on 02/08/2020. She is currently taking prescription vitamin D 50,000 IU each week. She denies nausea, vomiting or muscle weakness.  2. Type 2 diabetes mellitus with other specified complication, without long-term current use of insulin (HCC) Medications reviewed. Diabetic ROS: no polyuria or polydipsia, no chest pain, dyspnea or TIA's, no numbness, tingling or pain in extremities.   Lab Results  Component Value Date   HGBA1C 6.4 (H) 02/08/2020   Lab Results  Component Value Date   LDLCALC 91 02/08/2020   CREATININE 0.70 02/08/2020   Lab Results  Component Value Date   INSULIN 18.5 02/08/2020   INSULIN 15.9 08/27/2019   Assessment/Plan:   1. Vitamin D deficiency Low Vitamin D level contributes to fatigue and are associated with obesity, breast, and colon cancer. She agrees to continue to take prescription Vitamin D @50 ,000 IU every week and will follow-up for routine testing of Vitamin D, at least 2-3 times per year to avoid over-replacement.  Orders - Vitamin D, Ergocalciferol, (DRISDOL) 1.25 MG (50000 UNIT) CAPS capsule; Take 1 capsule (50,000 Units total) by mouth every 7  (seven) days.  Dispense: 4 capsule; Refill: 0  2. Type 2 diabetes mellitus with other specified complication, without long-term current use of insulin ([place]) Virginia Luna will start Ozempic next week.  Good blood sugar control is important to decrease the likelihood of diabetic complications such as nephropathy, neuropathy, limb loss, blindness, coronary artery disease, and death. Intensive lifestyle modification including diet, exercise and weight loss are the first line of treatment for diabetes.   3. Class 3 severe obesity with serious comorbidity and body mass index (BMI) of 50.0 to 59.9 in adult, unspecified obesity type (HCC) Virginia Luna is currently in the action stage of change. As such, her goal is to continue with weight loss efforts. She has agreed to the Category 1 Plan.   Exercise goals: For substantial health benefits, adults should do at least 150 minutes (2 hours and 30 minutes) a week of moderate-intensity, or 75 minutes (1 hour and 15 minutes) a week of vigorous-intensity aerobic physical activity, or an equivalent combination of moderate- and vigorous-intensity aerobic activity. Aerobic activity should be performed in episodes of at least 10 minutes, and preferably, it should be spread throughout the week.  Behavioral modification strategies: increasing lean protein intake.  Virginia Luna has agreed to follow-up with our clinic in 4 weeks. She was informed of the importance of frequent follow-up visits to maximize her success with intensive lifestyle modifications for her multiple health conditions.   Objective:   Blood pressure 122/77, pulse 96, temperature 97.6 F (36.4 C), temperature source Oral, height 5\' 1"  (1.549 m), weight  280 lb (127 kg), SpO2 96 %. Body mass index is 52.91 kg/m.  General: Cooperative, alert, well developed, in no acute distress. HEENT: Conjunctivae and lids unremarkable. Cardiovascular: Regular rhythm.  Lungs: Normal work of breathing. Neurologic: No focal  deficits.   Lab Results  Component Value Date   CREATININE 0.70 02/08/2020   BUN 20 02/08/2020   NA 144 02/08/2020   K 4.1 02/08/2020   CL 100 02/08/2020   CO2 25 02/08/2020   Lab Results  Component Value Date   ALT 21 02/08/2020   AST 12 02/08/2020   GGT 75 (H) 02/08/2020   ALKPHOS 140 (H) 02/08/2020   BILITOT 0.2 02/08/2020   Lab Results  Component Value Date   HGBA1C 6.4 (H) 02/08/2020   Lab Results  Component Value Date   INSULIN 18.5 02/08/2020   INSULIN 15.9 08/27/2019   Lab Results  Component Value Date   TSH 1.600 02/08/2020   Lab Results  Component Value Date   CHOL 176 02/08/2020   HDL 64 02/08/2020   LDLCALC 91 02/08/2020   TRIG 121 02/08/2020   Lab Results  Component Value Date   WBC 12.3 (H) 02/08/2020   HGB 11.8 02/08/2020   HCT 37.7 02/08/2020   MCV 85 02/08/2020   PLT 501 (H) 02/08/2020   Lab Results  Component Value Date   IRON 46 02/08/2020   TIBC 378 02/08/2020   FERRITIN 70 02/08/2020   Attestation Statements:   Reviewed by clinician on day of visit: allergies, medications, problem list, medical history, surgical history, family history, social history, and previous encounter notes.  I, Water quality scientist, CMA, am acting as transcriptionist for Briscoe Deutscher, DO  I have reviewed the above documentation for accuracy and completeness, and I agree with the above. Briscoe Deutscher, DO

## 2020-03-09 DIAGNOSIS — J3089 Other allergic rhinitis: Secondary | ICD-10-CM | POA: Diagnosis not present

## 2020-03-09 DIAGNOSIS — J3081 Allergic rhinitis due to animal (cat) (dog) hair and dander: Secondary | ICD-10-CM | POA: Diagnosis not present

## 2020-03-09 DIAGNOSIS — J301 Allergic rhinitis due to pollen: Secondary | ICD-10-CM | POA: Diagnosis not present

## 2020-03-16 DIAGNOSIS — J301 Allergic rhinitis due to pollen: Secondary | ICD-10-CM | POA: Diagnosis not present

## 2020-03-16 DIAGNOSIS — J3081 Allergic rhinitis due to animal (cat) (dog) hair and dander: Secondary | ICD-10-CM | POA: Diagnosis not present

## 2020-03-16 DIAGNOSIS — J3089 Other allergic rhinitis: Secondary | ICD-10-CM | POA: Diagnosis not present

## 2020-03-23 DIAGNOSIS — J3081 Allergic rhinitis due to animal (cat) (dog) hair and dander: Secondary | ICD-10-CM | POA: Diagnosis not present

## 2020-03-23 DIAGNOSIS — J3089 Other allergic rhinitis: Secondary | ICD-10-CM | POA: Diagnosis not present

## 2020-03-23 DIAGNOSIS — J301 Allergic rhinitis due to pollen: Secondary | ICD-10-CM | POA: Diagnosis not present

## 2020-03-30 ENCOUNTER — Ambulatory Visit (INDEPENDENT_AMBULATORY_CARE_PROVIDER_SITE_OTHER): Payer: PPO | Admitting: Family Medicine

## 2020-03-30 ENCOUNTER — Encounter (INDEPENDENT_AMBULATORY_CARE_PROVIDER_SITE_OTHER): Payer: Self-pay | Admitting: Family Medicine

## 2020-03-30 ENCOUNTER — Other Ambulatory Visit: Payer: Self-pay

## 2020-03-30 VITALS — BP 90/64 | HR 96 | Temp 98.1°F | Ht 61.0 in | Wt 271.0 lb

## 2020-03-30 DIAGNOSIS — Z6841 Body Mass Index (BMI) 40.0 and over, adult: Secondary | ICD-10-CM

## 2020-03-30 DIAGNOSIS — M5432 Sciatica, left side: Secondary | ICD-10-CM

## 2020-03-30 DIAGNOSIS — J301 Allergic rhinitis due to pollen: Secondary | ICD-10-CM | POA: Diagnosis not present

## 2020-03-30 DIAGNOSIS — E1169 Type 2 diabetes mellitus with other specified complication: Secondary | ICD-10-CM

## 2020-03-30 DIAGNOSIS — J3081 Allergic rhinitis due to animal (cat) (dog) hair and dander: Secondary | ICD-10-CM | POA: Diagnosis not present

## 2020-03-30 DIAGNOSIS — J3089 Other allergic rhinitis: Secondary | ICD-10-CM | POA: Diagnosis not present

## 2020-03-30 DIAGNOSIS — Z9189 Other specified personal risk factors, not elsewhere classified: Secondary | ICD-10-CM | POA: Diagnosis not present

## 2020-03-30 DIAGNOSIS — I10 Essential (primary) hypertension: Secondary | ICD-10-CM

## 2020-03-30 NOTE — Progress Notes (Signed)
Chief Complaint:   OBESITY Virginia Luna is here to discuss her progress with her obesity treatment plan along with follow-up of her obesity related diagnoses. Virginia Luna is on the Category 1 Plan and states she is following her eating plan most of the time. Virginia Luna states she is exercising for 0 minutes 0 times per week.  Today's visit was #: 11 Starting weight: 288 lbs Starting date: 08/27/2019 Today's weight: 271 lbs Today's date: 03/30/2020 Total lbs lost to date: 17 lbs Total lbs lost since last in-office visit: 9 lbs Total weight loss percentage to date: -5.90%  Interim History: Virginia Luna says she finally has Ozempic again.  She is also having left-sided sciatica.  Subjective:   1. Essential hypertension Review: taking medications as instructed, she says she feels dizzy.  BP Readings from Last 3 Encounters:  03/30/20 90/64  03/02/20 122/77  02/08/20 124/82   2. Sciatica of left side History of lumbar laminectomy in 2010.  She says she has increased pain with weakness.  3. Type 2 diabetes mellitus with other specified complication, without long-term current use of insulin (HCC) Medications reviewed.   Lab Results  Component Value Date   HGBA1C 6.4 (H) 02/08/2020   Lab Results  Component Value Date   LDLCALC 91 02/08/2020   CREATININE 0.70 02/08/2020   Lab Results  Component Value Date   INSULIN 18.5 02/08/2020   INSULIN 15.9 08/27/2019   Assessment/Plan:   1. Essential hypertension Over treated. Decrease Norvasc by 1/2 daily. Check BP at home. Continue to monitor for signs of orthostasis/hypotension.  2. Sciatica of left side Follow-up with PCP to discuss if MRI is warranted.   3. Type 2 diabetes mellitus with other specified complication, without long-term current use of insulin (HCC) Not optimized. Goal is HgbA1c < 5.7 and insulin level closer to 5. The current medical regimen is effective;  continue present plan and medications.  4. At risk for heart  disease Virginia Luna was given approximately 15 minutes of coronary artery disease prevention counseling today. She is 59 y.o. female and has risk factors for heart disease including obesity and DM. We discussed intensive lifestyle modifications today with an emphasis on specific weight loss instructions and strategies.   Repetitive spaced learning was employed today to elicit superior memory formation and behavioral change.  5. Class 3 severe obesity with serious comorbidity and body mass index (BMI) of 50.0 to 59.9 in adult, unspecified obesity type (HCC) Virginia Luna is currently in the action stage of change. As such, her goal is to continue with weight loss efforts. She has agreed to the Category 1 Plan.   Exercise goals: Older adults should follow the adult guidelines. When older adults cannot meet the adult guidelines, they should be as physically active as their abilities and conditions will allow.  Older adults should do exercises that maintain or improve balance if they are at risk of falling.   Behavioral modification strategies: increasing lean protein intake, decreasing simple carbohydrates and increasing vegetables.  Virginia Luna has agreed to follow-up with our clinic in 3 weeks. She was informed of the importance of frequent follow-up visits to maximize her success with intensive lifestyle modifications for her multiple health conditions.   Objective:   Blood pressure 90/64, pulse 96, temperature 98.1 F (36.7 C), temperature source Oral, height 5\' 1"  (1.549 m), weight 271 lb (122.9 kg), SpO2 100 %. Body mass index is 51.21 kg/m.  General: Cooperative, alert, well developed, in no acute distress. HEENT: Conjunctivae and lids unremarkable.  Cardiovascular: Regular rhythm.  Lungs: Normal work of breathing. Neurologic: No focal deficits.   Lab Results  Component Value Date   CREATININE 0.70 02/08/2020   BUN 20 02/08/2020   NA 144 02/08/2020   K 4.1 02/08/2020   CL 100 02/08/2020   CO2  25 02/08/2020   Lab Results  Component Value Date   ALT 21 02/08/2020   AST 12 02/08/2020   GGT 75 (H) 02/08/2020   ALKPHOS 140 (H) 02/08/2020   BILITOT 0.2 02/08/2020   Lab Results  Component Value Date   HGBA1C 6.4 (H) 02/08/2020   Lab Results  Component Value Date   INSULIN 18.5 02/08/2020   INSULIN 15.9 08/27/2019   Lab Results  Component Value Date   TSH 1.600 02/08/2020   Lab Results  Component Value Date   CHOL 176 02/08/2020   HDL 64 02/08/2020   LDLCALC 91 02/08/2020   TRIG 121 02/08/2020   Lab Results  Component Value Date   WBC 12.3 (H) 02/08/2020   HGB 11.8 02/08/2020   HCT 37.7 02/08/2020   MCV 85 02/08/2020   PLT 501 (H) 02/08/2020   Lab Results  Component Value Date   IRON 46 02/08/2020   TIBC 378 02/08/2020   FERRITIN 70 02/08/2020   Attestation Statements:   Reviewed by clinician on day of visit: allergies, medications, problem list, medical history, surgical history, family history, social history, and previous encounter notes.  I, Water quality scientist, CMA, am acting as transcriptionist for Briscoe Deutscher, DO  I have reviewed the above documentation for accuracy and completeness, and I agree with the above. Briscoe Deutscher, DO

## 2020-04-06 DIAGNOSIS — J3081 Allergic rhinitis due to animal (cat) (dog) hair and dander: Secondary | ICD-10-CM | POA: Diagnosis not present

## 2020-04-06 DIAGNOSIS — J3089 Other allergic rhinitis: Secondary | ICD-10-CM | POA: Diagnosis not present

## 2020-04-06 DIAGNOSIS — J301 Allergic rhinitis due to pollen: Secondary | ICD-10-CM | POA: Diagnosis not present

## 2020-04-13 DIAGNOSIS — J3081 Allergic rhinitis due to animal (cat) (dog) hair and dander: Secondary | ICD-10-CM | POA: Diagnosis not present

## 2020-04-13 DIAGNOSIS — J3089 Other allergic rhinitis: Secondary | ICD-10-CM | POA: Diagnosis not present

## 2020-04-13 DIAGNOSIS — D649 Anemia, unspecified: Secondary | ICD-10-CM | POA: Diagnosis not present

## 2020-04-13 DIAGNOSIS — E7800 Pure hypercholesterolemia, unspecified: Secondary | ICD-10-CM | POA: Diagnosis not present

## 2020-04-13 DIAGNOSIS — J301 Allergic rhinitis due to pollen: Secondary | ICD-10-CM | POA: Diagnosis not present

## 2020-04-13 DIAGNOSIS — E119 Type 2 diabetes mellitus without complications: Secondary | ICD-10-CM | POA: Diagnosis not present

## 2020-04-13 DIAGNOSIS — I10 Essential (primary) hypertension: Secondary | ICD-10-CM | POA: Diagnosis not present

## 2020-04-13 DIAGNOSIS — K219 Gastro-esophageal reflux disease without esophagitis: Secondary | ICD-10-CM | POA: Diagnosis not present

## 2020-04-13 DIAGNOSIS — J4520 Mild intermittent asthma, uncomplicated: Secondary | ICD-10-CM | POA: Diagnosis not present

## 2020-04-17 ENCOUNTER — Other Ambulatory Visit: Payer: Self-pay | Admitting: Psychiatry

## 2020-04-20 DIAGNOSIS — J301 Allergic rhinitis due to pollen: Secondary | ICD-10-CM | POA: Diagnosis not present

## 2020-04-20 DIAGNOSIS — J3081 Allergic rhinitis due to animal (cat) (dog) hair and dander: Secondary | ICD-10-CM | POA: Diagnosis not present

## 2020-04-20 DIAGNOSIS — J3089 Other allergic rhinitis: Secondary | ICD-10-CM | POA: Diagnosis not present

## 2020-04-20 NOTE — Telephone Encounter (Signed)
Next apt 12/01 

## 2020-04-26 ENCOUNTER — Other Ambulatory Visit (INDEPENDENT_AMBULATORY_CARE_PROVIDER_SITE_OTHER): Payer: Self-pay | Admitting: Family Medicine

## 2020-04-26 DIAGNOSIS — E559 Vitamin D deficiency, unspecified: Secondary | ICD-10-CM

## 2020-04-27 ENCOUNTER — Ambulatory Visit (INDEPENDENT_AMBULATORY_CARE_PROVIDER_SITE_OTHER): Payer: PPO | Admitting: Family Medicine

## 2020-04-27 ENCOUNTER — Encounter (INDEPENDENT_AMBULATORY_CARE_PROVIDER_SITE_OTHER): Payer: Self-pay | Admitting: Family Medicine

## 2020-04-27 ENCOUNTER — Other Ambulatory Visit: Payer: Self-pay

## 2020-04-27 VITALS — BP 131/83 | HR 99 | Temp 98.2°F | Ht 61.0 in | Wt 271.0 lb

## 2020-04-27 DIAGNOSIS — G8929 Other chronic pain: Secondary | ICD-10-CM | POA: Diagnosis not present

## 2020-04-27 DIAGNOSIS — R519 Headache, unspecified: Secondary | ICD-10-CM

## 2020-04-27 DIAGNOSIS — J301 Allergic rhinitis due to pollen: Secondary | ICD-10-CM | POA: Diagnosis not present

## 2020-04-27 DIAGNOSIS — Z6841 Body Mass Index (BMI) 40.0 and over, adult: Secondary | ICD-10-CM

## 2020-04-27 DIAGNOSIS — J3081 Allergic rhinitis due to animal (cat) (dog) hair and dander: Secondary | ICD-10-CM | POA: Diagnosis not present

## 2020-04-27 DIAGNOSIS — J3089 Other allergic rhinitis: Secondary | ICD-10-CM | POA: Diagnosis not present

## 2020-04-27 NOTE — Progress Notes (Signed)
Chief Complaint:   OBESITY Virginia Luna is here to discuss her progress with her obesity treatment plan along with follow-up of her obesity related diagnoses. Virginia Luna is on the Category 1 Plan and states she is following her eating plan approximately 50% of the time. Virginia Luna states she is walking less.  Today's visit was #: 12 Starting weight: 288 lbs Starting date: 08/27/2019 Today's weight: 271 lbs Today's date: 04/27/2020 Total lbs lost to date: 17 lbs Total lbs lost since last in-office visit: 0 Total weight loss percentage to date: -5.90%  Interim History: Virginia Luna says she has bilateral sciatica and loss of sensation.  She will be seeing Dr. Kenton Kingfisher next week.  She has had a migraine/cluster headache with vertigo and nausea for 1 month.  She reports that she had an episode of low blood pressure just prior to the onset of headaches. She has no overt allergy issue.  She says that she cannot eat much due to pain.  She is eating about 75% of normal.     Assessment/Plan:   1. Chronic nonintractable headache With vertigo and nausea.  Will place referral to Neurology today.  - Ambulatory referral to Neurology  2. Class 3 severe obesity with serious comorbidity and body mass index (BMI) of 50.0 to 59.9 in adult, unspecified obesity type (HCC) Virginia Luna is currently in the action stage of change. As such, her goal is to continue with weight loss efforts. She has agreed to the Category 1 Plan.   Exercise goals: As tolerated.  Behavioral modification strategies: increasing lean protein intake, decreasing simple carbohydrates, increasing vegetables and increasing water intake (with electrolytes).  Virginia Luna has agreed to follow-up with our clinic in 3-4 weeks. She was informed of the importance of frequent follow-up visits to maximize her success with intensive lifestyle modifications for her multiple health conditions.   Objective:   Blood pressure 131/83, pulse 99, temperature 98.2 F (36.8  C), temperature source Oral, height 5\' 1"  (1.549 m), weight 271 lb (122.9 kg), SpO2 98 %. Body mass index is 51.21 kg/m.  General: Cooperative, alert, well developed, in no acute distress. HEENT: Conjunctivae and lids unremarkable. Cardiovascular: Regular rhythm.  Lungs: Normal work of breathing. Neurologic: No focal deficits.   Lab Results  Component Value Date   CREATININE 0.70 02/08/2020   BUN 20 02/08/2020   NA 144 02/08/2020   K 4.1 02/08/2020   CL 100 02/08/2020   CO2 25 02/08/2020   Lab Results  Component Value Date   ALT 21 02/08/2020   AST 12 02/08/2020   GGT 75 (H) 02/08/2020   ALKPHOS 140 (H) 02/08/2020   BILITOT 0.2 02/08/2020   Lab Results  Component Value Date   HGBA1C 6.4 (H) 02/08/2020   Lab Results  Component Value Date   INSULIN 18.5 02/08/2020   INSULIN 15.9 08/27/2019   Lab Results  Component Value Date   TSH 1.600 02/08/2020   Lab Results  Component Value Date   CHOL 176 02/08/2020   HDL 64 02/08/2020   LDLCALC 91 02/08/2020   TRIG 121 02/08/2020   Lab Results  Component Value Date   WBC 12.3 (H) 02/08/2020   HGB 11.8 02/08/2020   HCT 37.7 02/08/2020   MCV 85 02/08/2020   PLT 501 (H) 02/08/2020   Lab Results  Component Value Date   IRON 46 02/08/2020   TIBC 378 02/08/2020   FERRITIN 70 02/08/2020   Attestation Statements:   Reviewed by clinician on day of visit: allergies,  medications, problem list, medical history, surgical history, family history, social history, and previous encounter notes. Time pre and post visit: 30 minutes.   I, Water quality scientist, CMA, am acting as transcriptionist for Briscoe Deutscher, DO  I have reviewed the above documentation for accuracy and completeness, and I agree with the above. Briscoe Deutscher, DO

## 2020-04-28 ENCOUNTER — Encounter: Payer: Self-pay | Admitting: Neurology

## 2020-05-02 ENCOUNTER — Other Ambulatory Visit: Payer: Self-pay | Admitting: Family Medicine

## 2020-05-02 DIAGNOSIS — R202 Paresthesia of skin: Secondary | ICD-10-CM | POA: Diagnosis not present

## 2020-05-02 DIAGNOSIS — M5416 Radiculopathy, lumbar region: Secondary | ICD-10-CM | POA: Diagnosis not present

## 2020-05-02 DIAGNOSIS — Z23 Encounter for immunization: Secondary | ICD-10-CM | POA: Diagnosis not present

## 2020-05-04 DIAGNOSIS — J3089 Other allergic rhinitis: Secondary | ICD-10-CM | POA: Diagnosis not present

## 2020-05-04 DIAGNOSIS — J3081 Allergic rhinitis due to animal (cat) (dog) hair and dander: Secondary | ICD-10-CM | POA: Diagnosis not present

## 2020-05-04 DIAGNOSIS — J301 Allergic rhinitis due to pollen: Secondary | ICD-10-CM | POA: Diagnosis not present

## 2020-05-11 DIAGNOSIS — I10 Essential (primary) hypertension: Secondary | ICD-10-CM | POA: Diagnosis not present

## 2020-05-11 DIAGNOSIS — J4520 Mild intermittent asthma, uncomplicated: Secondary | ICD-10-CM | POA: Diagnosis not present

## 2020-05-11 DIAGNOSIS — K219 Gastro-esophageal reflux disease without esophagitis: Secondary | ICD-10-CM | POA: Diagnosis not present

## 2020-05-11 DIAGNOSIS — E119 Type 2 diabetes mellitus without complications: Secondary | ICD-10-CM | POA: Diagnosis not present

## 2020-05-11 DIAGNOSIS — J3089 Other allergic rhinitis: Secondary | ICD-10-CM | POA: Diagnosis not present

## 2020-05-11 DIAGNOSIS — D649 Anemia, unspecified: Secondary | ICD-10-CM | POA: Diagnosis not present

## 2020-05-11 DIAGNOSIS — J301 Allergic rhinitis due to pollen: Secondary | ICD-10-CM | POA: Diagnosis not present

## 2020-05-11 DIAGNOSIS — J3081 Allergic rhinitis due to animal (cat) (dog) hair and dander: Secondary | ICD-10-CM | POA: Diagnosis not present

## 2020-05-11 DIAGNOSIS — E7800 Pure hypercholesterolemia, unspecified: Secondary | ICD-10-CM | POA: Diagnosis not present

## 2020-05-18 DIAGNOSIS — J3089 Other allergic rhinitis: Secondary | ICD-10-CM | POA: Diagnosis not present

## 2020-05-18 DIAGNOSIS — J301 Allergic rhinitis due to pollen: Secondary | ICD-10-CM | POA: Diagnosis not present

## 2020-05-18 DIAGNOSIS — J3081 Allergic rhinitis due to animal (cat) (dog) hair and dander: Secondary | ICD-10-CM | POA: Diagnosis not present

## 2020-05-20 ENCOUNTER — Ambulatory Visit
Admission: RE | Admit: 2020-05-20 | Discharge: 2020-05-20 | Disposition: A | Discharge status: Home / Self Care | Payer: PPO | Source: Ambulatory Visit | Attending: Family Medicine | Admitting: Family Medicine

## 2020-05-20 DIAGNOSIS — M4726 Other spondylosis with radiculopathy, lumbar region: Secondary | ICD-10-CM | POA: Diagnosis not present

## 2020-05-20 DIAGNOSIS — M4319 Spondylolisthesis, multiple sites in spine: Secondary | ICD-10-CM | POA: Diagnosis not present

## 2020-05-20 DIAGNOSIS — M5416 Radiculopathy, lumbar region: Secondary | ICD-10-CM

## 2020-05-20 DIAGNOSIS — M5116 Intervertebral disc disorders with radiculopathy, lumbar region: Secondary | ICD-10-CM | POA: Diagnosis not present

## 2020-05-20 DIAGNOSIS — M5117 Intervertebral disc disorders with radiculopathy, lumbosacral region: Secondary | ICD-10-CM | POA: Diagnosis not present

## 2020-05-20 MED ORDER — GADOBENATE DIMEGLUMINE 529 MG/ML IV SOLN: 20.0000 mL | Freq: Once | INTRAVENOUS | Status: DC | PRN

## 2020-05-22 ENCOUNTER — Encounter (INDEPENDENT_AMBULATORY_CARE_PROVIDER_SITE_OTHER): Payer: Self-pay | Admitting: Family Medicine

## 2020-05-22 ENCOUNTER — Other Ambulatory Visit: Payer: PPO

## 2020-05-24 DIAGNOSIS — J4520 Mild intermittent asthma, uncomplicated: Secondary | ICD-10-CM | POA: Diagnosis not present

## 2020-05-24 DIAGNOSIS — K219 Gastro-esophageal reflux disease without esophagitis: Secondary | ICD-10-CM | POA: Diagnosis not present

## 2020-05-24 DIAGNOSIS — E7800 Pure hypercholesterolemia, unspecified: Secondary | ICD-10-CM | POA: Diagnosis not present

## 2020-05-24 DIAGNOSIS — I10 Essential (primary) hypertension: Secondary | ICD-10-CM | POA: Diagnosis not present

## 2020-05-24 DIAGNOSIS — E119 Type 2 diabetes mellitus without complications: Secondary | ICD-10-CM | POA: Diagnosis not present

## 2020-05-24 DIAGNOSIS — D649 Anemia, unspecified: Secondary | ICD-10-CM | POA: Diagnosis not present

## 2020-05-25 ENCOUNTER — Ambulatory Visit (INDEPENDENT_AMBULATORY_CARE_PROVIDER_SITE_OTHER): Payer: PPO | Admitting: Family Medicine

## 2020-05-25 ENCOUNTER — Encounter (INDEPENDENT_AMBULATORY_CARE_PROVIDER_SITE_OTHER): Payer: Self-pay | Admitting: Family Medicine

## 2020-05-25 ENCOUNTER — Other Ambulatory Visit: Payer: Self-pay

## 2020-05-25 VITALS — BP 130/83 | HR 101 | Temp 98.2°F | Ht 61.0 in | Wt 278.0 lb

## 2020-05-25 DIAGNOSIS — E559 Vitamin D deficiency, unspecified: Secondary | ICD-10-CM | POA: Diagnosis not present

## 2020-05-25 DIAGNOSIS — E785 Hyperlipidemia, unspecified: Secondary | ICD-10-CM | POA: Diagnosis not present

## 2020-05-25 DIAGNOSIS — K760 Fatty (change of) liver, not elsewhere classified: Secondary | ICD-10-CM | POA: Insufficient documentation

## 2020-05-25 DIAGNOSIS — E1159 Type 2 diabetes mellitus with other circulatory complications: Secondary | ICD-10-CM | POA: Diagnosis not present

## 2020-05-25 DIAGNOSIS — J3089 Other allergic rhinitis: Secondary | ICD-10-CM | POA: Diagnosis not present

## 2020-05-25 DIAGNOSIS — J3081 Allergic rhinitis due to animal (cat) (dog) hair and dander: Secondary | ICD-10-CM | POA: Diagnosis not present

## 2020-05-25 DIAGNOSIS — M5432 Sciatica, left side: Secondary | ICD-10-CM | POA: Diagnosis not present

## 2020-05-25 DIAGNOSIS — E119 Type 2 diabetes mellitus without complications: Secondary | ICD-10-CM | POA: Insufficient documentation

## 2020-05-25 DIAGNOSIS — M5431 Sciatica, right side: Secondary | ICD-10-CM | POA: Diagnosis not present

## 2020-05-25 DIAGNOSIS — I152 Hypertension secondary to endocrine disorders: Secondary | ICD-10-CM | POA: Diagnosis not present

## 2020-05-25 DIAGNOSIS — E1169 Type 2 diabetes mellitus with other specified complication: Secondary | ICD-10-CM

## 2020-05-25 DIAGNOSIS — J301 Allergic rhinitis due to pollen: Secondary | ICD-10-CM | POA: Diagnosis not present

## 2020-05-25 MED ORDER — VITAMIN D (ERGOCALCIFEROL) 1.25 MG (50000 UNIT) PO CAPS: 50000.0000 [IU] | ORAL_CAPSULE | ORAL | 0 refills | Status: DC

## 2020-05-25 NOTE — Progress Notes (Signed)
Chief Complaint:   OBESITY Virginia Luna is here to discuss her progress with her obesity treatment plan along with follow-up of her obesity related diagnoses.   Today's visit was #: 13 Starting weight: 288 lbs Starting date: 08/27/2019 Today's weight: 278 lbs Today's date: 05/26/2020 Total lbs lost to date: 10 lbs Body mass index is 52.53 kg/m.  Total weight loss percentage to date: -3.47%  Interim History: Virginia Luna reports bilateral sciatica with weakness and paresthesias.  She had an MRI last week.  FINDINGS: Multilevel spondylosis, mildly progressed since prior exam. Mild L4-5 spinal canal and bilateral neural foraminal narrowing. L5-S1 disc bulge abutting the exiting L5 and descending S1 nerve roots.  Nutrition Plan: Category 1. Anti-obesity medications: Ozempic 1 mg subcu weekly. Reported side effects: None. Hunger is well controlled controlled. Cravings are well controlled controlled.  Activity: Walking for 10-15 minutes 7 days per week.  Assessment/Plan:   1. Bilateral sciatica Virginia Luna is very uncomfortable today. We reviewed her MRI together. Will refer to Sports Medicine for evaluation and treatment as necessary.  - Ambulatory referral to Sports Medicine  2. Type 2 diabetes mellitus with other specified complication, without long-term current use of insulin (HCC) Diabetes Mellitus: stable. Medication: metformin 500 mg at bedtime and Ozempic 1 mg subcu weekly. Issues reviewed with her: blood sugar goals, complications of diabetes mellitus, hypoglycemia prevention and treatment, exercise, nutrition, and carbohydrate counting.   Lab Results  Component Value Date   HGBA1C 6.4 (H) 02/08/2020   Lab Results  Component Value Date   LDLCALC 91 02/08/2020   CREATININE 0.70 02/08/2020   3. Hypertension associated with diabetes ([place]) At goal. Medications: Norvasc 5 mg daily and Toprol-XL 25 mg daily. The current medical regimen is effective;  continue present plan and  medications.  BP Readings from Last 3 Encounters:  05/25/20 130/83  04/27/20 131/83  03/30/20 90/64   Lab Results  Component Value Date   CREATININE 0.70 02/08/2020   4. Hyperlipidemia associated with type 2 diabetes mellitus ([place]) Well controlled on current regimen. LDL is at goal on current statin dose, patient has no side effects from medication and LFTS are normal. No changes today.  She is taking Lipitor 10 mg at bedtime.  Lab Results  Component Value Date   ALT 21 02/08/2020   AST 12 02/08/2020   GGT 75 (H) 02/08/2020   ALKPHOS 140 (H) 02/08/2020   BILITOT 0.2 02/08/2020   Lab Results  Component Value Date   CHOL 176 02/08/2020   HDL 64 02/08/2020   LDLCALC 91 02/08/2020   TRIG 121 02/08/2020   5. Vitamin D deficiency Current vitamin D is 31.1, tested on 02/08/2020. Not at goal. Optimal goal > 50 ng/dL. There is also evidence to support a goal of >70 ng/dL in patients with cancer and heart disease. Plan: Continue Vitamin D @50 ,000 IU every week with follow-up for routine testing of Vitamin D at least 2-3 times per year to avoid over-replacement.  -Refill Vitamin D, Ergocalciferol, (DRISDOL) 1.25 MG (50000 UNIT) CAPS capsule; Take 1 capsule (50,000 Units total) by mouth every 7 (seven) days.  Dispense: 4 capsule; Refill: 0  Virginia Luna is currently in the action stage of change. As such, her goal is to continue with weight loss efforts.   Nutrition goals: She has agreed to the Category 1 Plan.   Exercise goals: No exercise has been prescribed at this time. Until evaluated.  Behavioral modification strategies: emotional eating strategies.  Virginia Luna has agreed to follow-up with  our clinic in 3 weeks. She was informed of the importance of frequent follow-up visits to maximize her success with intensive lifestyle modifications for her multiple health conditions.   Objective:   Blood pressure 130/83, pulse (!) 101, temperature 98.2 F (36.8 C), temperature source Oral, height  5\' 1"  (1.549 m), weight 278 lb (126.1 kg), SpO2 95 %. Body mass index is 52.53 kg/m.  General: Cooperative, alert, well developed, in no acute distress. HEENT: Conjunctivae and lids unremarkable. Cardiovascular: Regular rhythm.  Lungs: Normal work of breathing. Neurologic: No focal deficits.   Lab Results  Component Value Date   CREATININE 0.70 02/08/2020   BUN 20 02/08/2020   NA 144 02/08/2020   K 4.1 02/08/2020   CL 100 02/08/2020   CO2 25 02/08/2020   Lab Results  Component Value Date   ALT 21 02/08/2020   AST 12 02/08/2020   GGT 75 (H) 02/08/2020   ALKPHOS 140 (H) 02/08/2020   BILITOT 0.2 02/08/2020   Lab Results  Component Value Date   HGBA1C 6.4 (H) 02/08/2020   Lab Results  Component Value Date   INSULIN 18.5 02/08/2020   INSULIN 15.9 08/27/2019   Lab Results  Component Value Date   TSH 1.600 02/08/2020   Lab Results  Component Value Date   CHOL 176 02/08/2020   HDL 64 02/08/2020   LDLCALC 91 02/08/2020   TRIG 121 02/08/2020   Lab Results  Component Value Date   WBC 12.3 (H) 02/08/2020   HGB 11.8 02/08/2020   HCT 37.7 02/08/2020   MCV 85 02/08/2020   PLT 501 (H) 02/08/2020   Lab Results  Component Value Date   IRON 46 02/08/2020   TIBC 378 02/08/2020   FERRITIN 70 02/08/2020   Attestation Statements:   Reviewed by clinician on day of visit: allergies, medications, problem list, medical history, surgical history, family history, social history, and previous encounter notes.  Time spent on visit including pre-visit chart review and post-visit care and charting was 45 minutes.   I, Water quality scientist, CMA, am acting as transcriptionist for Briscoe Deutscher, DO  I have reviewed the above documentation for accuracy and completeness, and I agree with the above. Briscoe Deutscher, DO

## 2020-05-27 ENCOUNTER — Other Ambulatory Visit: Payer: Self-pay

## 2020-05-27 ENCOUNTER — Ambulatory Visit (INDEPENDENT_AMBULATORY_CARE_PROVIDER_SITE_OTHER): Payer: PPO | Admitting: Family Medicine

## 2020-05-27 ENCOUNTER — Encounter: Payer: Self-pay | Admitting: Family Medicine

## 2020-05-27 VITALS — BP 120/80 | HR 96 | Ht 61.0 in | Wt 277.8 lb

## 2020-05-27 DIAGNOSIS — M5432 Sciatica, left side: Secondary | ICD-10-CM | POA: Diagnosis not present

## 2020-05-27 DIAGNOSIS — R202 Paresthesia of skin: Secondary | ICD-10-CM | POA: Diagnosis not present

## 2020-05-27 NOTE — Progress Notes (Signed)
Subjective:    I'm seeing this patient as a consultation for:  Dr. Juleen China. Note will be routed back to referring provider/PCP.  CC: Low back pain  I, Molly Weber, LAT, ATC, am serving as scribe for Dr. Lynne Leader.  HPI: Pt is a 59 y/o female presenting w/ c/o low back pain w/ radiating pain into her B LEs and noted paresthesias and weakness in her B LEs, L>R.  She states that she's been having symptoms for approximately one year.  Main symptom is numbness in her left foot.  She also has painful paresthesia and in her feet and pain radiating down her left leg.  She notes some back pain but her dominant symptom is more radicular.  Radiating pain:yes into her B LEs into her feet LE numbness/tingling: yes all the way to her feet LE weakness: yes Aggravating factors: all positions are irritating including sitting, standing, walking and laying flat Treatments tried: ice, heat, pain meds and muscle relaxers  Diagnostic testing: L-spine MRI- 05/20/20  Past medical history, Surgical history, Family history, Social history, Allergies, and medications have been entered into the medical record, reviewed. Diabetes obesity.  History lumbar partial laminectomy L5-S1  Review of Systems: No new headache, visual changes, nausea, vomiting, diarrhea, constipation, dizziness, abdominal pain, skin rash, fevers, chills, night sweats, weight loss, swollen lymph nodes, body aches, joint swelling, muscle aches, chest pain, shortness of breath, mood changes, visual or auditory hallucinations.   Objective:    Vitals:   05/27/20 0938  BP: 120/80  Pulse: 96  SpO2: 96%   General: Well Developed, well nourished, and in no acute distress.  Neuro/Psych: Alert and oriented x3, extra-ocular muscles intact, able to move all 4 extremities, sensation grossly intact. Skin: Warm and dry, no rashes noted.  Respiratory: Not using accessory muscles, speaking in full sentences, trachea midline.  Cardiovascular: Pulses  palpable, no extremity edema. Abdomen: Does not appear distended. MSK: L-spine normal-appearing nontender midline normal lumbar motion. Lower extremity strength and reflexes are equal and normal throughout bilateral lower extremities. Decreased sensation light touch left foot dorsal and plantar.  Lab and Radiology Results MR LUMBAR SPINE WO CONTRAST  Result Date: 05/20/2020 CLINICAL DATA:  Lower back pain, leg pain. EXAM: MRI LUMBAR SPINE WITHOUT CONTRAST TECHNIQUE: Multiplanar, multisequence MR imaging of the lumbar spine was performed. No intravenous contrast was administered. COMPARISON:  10/13/2008 MRI lumbar spine MR. 10/09/2008 lumbar spine radiographs. FINDINGS: Please note that image quality is mildly degraded by motion artifact. Segmentation:  Standard. Alignment: Grade 1 L4-5 and L5-S1 anterolisthesis. Straightening of lordosis. Vertebrae: Modic type 2 endplate degenerative changes at the L4-S1 level. Scattered T1/T2 hyperintense foci reflect hemangiomata versus focal fat. Sequela of prior left laminectomy at L5. Conus medullaris and cauda equina: Conus extends to the L1 level. Conus and cauda equina appear normal. Disc levels: Multilevel desiccation and mild disc space loss most prominent at the L5-S1 level. L1-2: Minimal disc bulge and bilateral facet degenerative spurring. Patent spinal canal and bilateral neural foramen. L2-3: Minimal disc bulge and bilateral facet hypertrophy. Patent spinal canal and neural foramen. L3-4: Minimal disc bulge, prominent ligamentum flavum and bilateral facet degenerative spurring. Patent spinal canal and bilateral neural foramen. L4-5: Mild disc bulge with superimposed left extraforaminal protrusion. Exuberant bilateral facet hypertrophy. Mild spinal canal and bilateral neural foraminal narrowing. L5-S1: Sequela of L5 laminectomy. Mild disc bulge abutting the exiting L5 and descending right greater than left S1 nerve roots. Bilateral facet hypertrophy trace  effusion. Patent spinal canal and  bilateral neural foraminal narrowing. Paraspinal and other soft tissues: Negative. IMPRESSION: Multilevel spondylosis, mildly progressed since prior exam. Mild L4-5 spinal canal and bilateral neural foraminal narrowing. L5-S1 disc bulge abutting the exiting L5 and descending S1 nerve roots. Electronically Signed   By: Primitivo Gauze M.D.   On: 05/20/2020 10:37   I, Lynne Leader, personally (independently) visualized and performed the interpretation of the images attached in this note. Prior laminectomy.  Facet DJD present lower portions of lumbar spine.  Possible neurocompression at L5-S1   Impression and Recommendations:    Assessment and Plan: 59 y.o. female with left leg pain and numbness.  Most likely due to lumbosacral radiculopathy as seen on MRI.  She may have a component of peripheral neuropathy as well from her diabetes.  Plan for epidural steroid injection.  If this fails would consider EMG/nerve conduction study as next step to further evaluate possibility of peripheral neuropathy.  Check back as needed..   Orders Placed This Encounter  Procedures  . DG INJECT DIAG/THERA/INC NEEDLE/CATH/PLC EPI/LUMB/SAC W/IMG    Standing Status:   Future    Standing Expiration Date:   05/27/2021    Order Specific Question:   Reason for Exam (SYMPTOM  OR DIAGNOSIS REQUIRED)    Answer:   Left L5 to S1 radiculopathy. Level and technique per radiology    Order Specific Question:   Is the patient pregnant?    Answer:   No    Order Specific Question:   Preferred Imaging Location?    Answer:   [id number]    Order Specific Question:   Radiology Contrast Protocol - do NOT remove file path    Answer:   [email or web address]   No orders of the defined types were placed in this encounter.   Discussed warning signs or symptoms. Please see discharge instructions. Patient expresses understanding.   The above documentation has  been reviewed and is accurate and complete Lynne Leader, M.D.

## 2020-05-27 NOTE — Patient Instructions (Signed)
Thank you for coming in today.  Please call [facility] at [phone] to schedule your spine injection.   If not better let me know.  Next step is either repeat injection or nerve conduction study.   Epidural Steroid Injection Patient Information  Description: The epidural space surrounds the nerves as they exit the spinal cord.  In some patients, the nerves can be compressed and inflamed by a bulging disc or a tight spinal canal (spinal stenosis).  By injecting steroids into the epidural space, we can bring irritated nerves into direct contact with a potentially helpful medication.  These steroids act directly on the irritated nerves and can reduce swelling and inflammation which often leads to decreased pain.  Epidural steroids may be injected anywhere along the spine and from the neck to the low back depending upon the location of your pain.   After numbing the skin with local anesthetic (like Novocaine), a small needle is passed into the epidural space slowly.  You may experience a sensation of pressure while this is being done.  The entire block usually last less than 10 minutes.  Conditions which may be treated by epidural steroids:   Low back and leg pain  Neck and arm pain  Spinal stenosis  Post-laminectomy syndrome  Herpes zoster (shingles) pain  Pain from compression fractures  Preparation for the injection:  1. Do not eat any solid food or dairy products within 8 hours of your appointment.  2. You may drink clear liquids up to 3 hours before appointment.  Clear liquids include water, black coffee, juice or soda.  No milk or cream please. 3. You may take your regular medication, including pain medications, with a sip of water before your appointment  Diabetics should hold regular insulin (if taken separately) and take 1/2 normal NPH dos the morning of the procedure.  Carry some sugar containing items with you to your appointment. 4. A driver must accompany you and  be prepared to drive you home after your procedure.  5. Bring all your current medications with your. 6. An IV may be inserted and sedation may be given at the discretion of the physician.   7. A blood pressure cuff, EKG and other monitors will often be applied during the procedure.  Some patients may need to have extra oxygen administered for a short period. 8. You will be asked to provide medical information, including your allergies, prior to the procedure.  We must know immediately if you are taking blood thinners (like Coumadin/Warfarin)  Or if you are allergic to IV iodine contrast (dye). We must know if you could possible be pregnant.  Possible side-effects:  Bleeding from needle site  Infection (rare, may require surgery)  Nerve injury (rare)  Numbness & tingling (temporary)  Difficulty urinating (rare, temporary)  Spinal headache ( a headache worse with upright posture)  Light -headedness (temporary)  Pain at injection site (several days)  Decreased blood pressure (temporary)  Weakness in arm/leg (temporary)  Pressure sensation in back/neck (temporary)  Call if you experience:  Fever/chills associated with headache or increased back/neck pain.  Headache worsened by an upright position.  New onset weakness or numbness of an extremity below the injection site  Hives or difficulty breathing (go to the emergency room)  Inflammation or drainage at the infection site  Severe back/neck pain  Any new symptoms which are concerning to you  Please note:  Although the local anesthetic injected can often make your back or neck feel good  for several hours after the injection, the pain will likely return.  It takes 3-7 days for steroids to work in the epidural space.  You may not notice any pain relief for at least that one week.  If effective, we will often do a series of three injections spaced 3-6 weeks apart to maximally decrease your pain.  After the initial series,  we generally will wait several months before considering a repeat injection of the same type.

## 2020-06-01 DIAGNOSIS — J3089 Other allergic rhinitis: Secondary | ICD-10-CM | POA: Diagnosis not present

## 2020-06-01 DIAGNOSIS — J3081 Allergic rhinitis due to animal (cat) (dog) hair and dander: Secondary | ICD-10-CM | POA: Diagnosis not present

## 2020-06-06 ENCOUNTER — Ambulatory Visit
Admission: RE | Admit: 2020-06-06 | Discharge: 2020-06-06 | Disposition: A | Discharge status: Home / Self Care | Payer: PPO | Source: Ambulatory Visit | Attending: Family Medicine | Admitting: Family Medicine

## 2020-06-06 ENCOUNTER — Other Ambulatory Visit: Payer: Self-pay

## 2020-06-06 DIAGNOSIS — R202 Paresthesia of skin: Secondary | ICD-10-CM

## 2020-06-06 DIAGNOSIS — M5432 Sciatica, left side: Secondary | ICD-10-CM

## 2020-06-06 DIAGNOSIS — M5116 Intervertebral disc disorders with radiculopathy, lumbar region: Secondary | ICD-10-CM | POA: Diagnosis not present

## 2020-06-06 MED ORDER — IOPAMIDOL (ISOVUE-M 200) INJECTION 41%
1.0000 mL | Freq: Once | INTRAMUSCULAR | Status: AC
  Administered 2020-06-06: 1 mL via EPIDURAL

## 2020-06-06 MED ORDER — METHYLPREDNISOLONE ACETATE 40 MG/ML INJ SUSP (RADIOLOG
120.0000 mg | Freq: Once | INTRAMUSCULAR | Status: AC
  Administered 2020-06-06: 120 mg via EPIDURAL

## 2020-06-06 NOTE — Discharge Instructions (Signed)

## 2020-06-08 DIAGNOSIS — J3089 Other allergic rhinitis: Secondary | ICD-10-CM | POA: Diagnosis not present

## 2020-06-08 DIAGNOSIS — J3081 Allergic rhinitis due to animal (cat) (dog) hair and dander: Secondary | ICD-10-CM | POA: Diagnosis not present

## 2020-06-08 DIAGNOSIS — J301 Allergic rhinitis due to pollen: Secondary | ICD-10-CM | POA: Diagnosis not present

## 2020-06-15 DIAGNOSIS — J301 Allergic rhinitis due to pollen: Secondary | ICD-10-CM | POA: Diagnosis not present

## 2020-06-15 DIAGNOSIS — J3081 Allergic rhinitis due to animal (cat) (dog) hair and dander: Secondary | ICD-10-CM | POA: Diagnosis not present

## 2020-06-15 DIAGNOSIS — J3089 Other allergic rhinitis: Secondary | ICD-10-CM | POA: Diagnosis not present

## 2020-06-16 DIAGNOSIS — I10 Essential (primary) hypertension: Secondary | ICD-10-CM | POA: Diagnosis not present

## 2020-06-22 ENCOUNTER — Other Ambulatory Visit: Payer: Self-pay

## 2020-06-22 ENCOUNTER — Ambulatory Visit (INDEPENDENT_AMBULATORY_CARE_PROVIDER_SITE_OTHER): Payer: PPO | Admitting: Family Medicine

## 2020-06-22 ENCOUNTER — Encounter (INDEPENDENT_AMBULATORY_CARE_PROVIDER_SITE_OTHER): Payer: Self-pay | Admitting: Family Medicine

## 2020-06-22 VITALS — BP 123/83 | HR 98 | Temp 97.7°F | Ht 61.0 in | Wt 278.0 lb

## 2020-06-22 DIAGNOSIS — E1169 Type 2 diabetes mellitus with other specified complication: Secondary | ICD-10-CM | POA: Diagnosis not present

## 2020-06-22 DIAGNOSIS — I152 Hypertension secondary to endocrine disorders: Secondary | ICD-10-CM | POA: Diagnosis not present

## 2020-06-22 DIAGNOSIS — E1159 Type 2 diabetes mellitus with other circulatory complications: Secondary | ICD-10-CM

## 2020-06-22 DIAGNOSIS — G43809 Other migraine, not intractable, without status migrainosus: Secondary | ICD-10-CM | POA: Diagnosis not present

## 2020-06-22 DIAGNOSIS — Z9189 Other specified personal risk factors, not elsewhere classified: Secondary | ICD-10-CM

## 2020-06-22 DIAGNOSIS — K5909 Other constipation: Secondary | ICD-10-CM

## 2020-06-22 DIAGNOSIS — J3089 Other allergic rhinitis: Secondary | ICD-10-CM | POA: Diagnosis not present

## 2020-06-22 DIAGNOSIS — J301 Allergic rhinitis due to pollen: Secondary | ICD-10-CM | POA: Diagnosis not present

## 2020-06-22 DIAGNOSIS — Z6841 Body Mass Index (BMI) 40.0 and over, adult: Secondary | ICD-10-CM

## 2020-06-22 DIAGNOSIS — E559 Vitamin D deficiency, unspecified: Secondary | ICD-10-CM

## 2020-06-22 DIAGNOSIS — J3081 Allergic rhinitis due to animal (cat) (dog) hair and dander: Secondary | ICD-10-CM | POA: Diagnosis not present

## 2020-06-22 MED ORDER — MECLIZINE HCL 25 MG PO TABS: 25.0000 mg | ORAL_TABLET | Freq: Three times a day (TID) | ORAL | 0 refills | Status: DC | PRN

## 2020-06-22 MED ORDER — ONDANSETRON 4 MG PO TBDP: 4.0000 mg | ORAL_TABLET | Freq: Four times a day (QID) | ORAL | 0 refills | Status: DC | PRN

## 2020-06-22 MED ORDER — SUMATRIPTAN SUCCINATE 100 MG PO TABS: 100.0000 mg | ORAL_TABLET | Freq: Every day | ORAL | 0 refills | Status: DC | PRN

## 2020-06-23 ENCOUNTER — Encounter: Payer: Self-pay | Admitting: Obstetrics and Gynecology

## 2020-06-23 NOTE — Progress Notes (Signed)
Chief Complaint:   OBESITY Virginia Luna is here to discuss her progress with her obesity treatment plan along with follow-up of her obesity related diagnoses.   Today's visit was #: 14 Starting weight: 288 lbs Starting date: 08/27/2019 Today's weight: 278 lbs Today's date: 06/22/2020 Total lbs lost to date: 10 lbs Body mass index is 52.53 kg/m.  Total weight loss percentage to date: -3.47%  Nutrition Plan: the Category 1 Plan for 0% of the time.  Anti-obesity medications: metformin and Ozempic. Reported side effects: None. Hunger is moderately controlled controlled. Cravings are well controlled controlled.  Activity: Minimal walking. Sleep: Number of hours slept each night: 2.5 total. Sleep is not restful.  Barriers: Virginia Luna is status post L4-L5 ESI (06/06/2020).  Still feeling shooting pain.   Assessment/Plan:   1. Vitamin D deficiency Current vitamin D is 31.1, tested on 02/08/2020. Not at goal. Optimal goal > 50 ng/dL.   Plan:  [x]   Continue Vitamin D @50 ,000 IU every week. []   Continue home supplement daily. [x]   Follow-up for routine testing of Vitamin D at least 2-3 times per year to avoid over-replacement. []   Monitored by PCP.  2. Type 2 diabetes mellitus with other specified complication, without long-term current use of insulin ([place]ia Luna) Diabetes Mellitus: reasonably well controlled. Medication: Ozempic and metformin. Issues reviewed with her: blood sugar goals, complications of diabetes mellitus, hypoglycemia prevention and treatment, exercise, nutrition, and carbohydrate counting.   Lab Results  Component Value Date   HGBA1C 6.4 (H) 02/08/2020   Lab Results  Component Value Date   LDLCALC 91 02/08/2020   CREATININE 0.70 02/08/2020   3. Hypertension associated with diabetes ([place]) At goal. Medications: Norvasc, metoprolol. Plan: Avoid buying foods that are: processed, frozen, or prepackaged to avoid excess salt. We will continue to monitor symptoms as they relate to  her weight loss journey.  BP Readings from Last 3 Encounters:  06/22/20 123/83  06/06/20 134/82  05/27/20 120/80   Lab Results  Component Value Date   CREATININE 0.70 02/08/2020   4. Other constipation Linzess is helpful. We will continue to monitor symptoms as they relate to her weight loss journey.  Counseling Getting to Good Bowel Health: Your goal is to have one soft bowel movement each day. Drink at least 8 glasses of water each day. Eat plenty of fiber (goal is over 25 grams each day). It is best to get most of your fiber from dietary sources which includes leafy green vegetables, fresh fruit, and whole grains. You may need to add fiber with the help of OTC fiber supplements. These include Metamucil, Citrucel, and Benefiber. If you are still having trouble, try adding Miralax or Magnesium Citrate. If all of these changes do not work, contact me.  5. Other migraine without status migrainosus, not intractable Virginia Luna is followed by Dr. Tomi Likens in Neurology.  She last took Imitrex 2 days ago. She is out of all of her medication. Will provide courtesy refills today.   - Refill SUMAtriptan (IMITREX) 100 MG tablet; Take 1 tablet (100 mg total) by mouth daily as needed for migraine. May repeat in 2 hours if headache persists or recurs.  Dispense: 10 tablet; Refill: 0 - Refill meclizine (ANTIVERT) 25 MG tablet; Take 1 tablet (25 mg total) by mouth every 8 (eight) hours as needed for dizziness.  Dispense: 30 tablet; Refill: 0 - Refill ondansetron (ZOFRAN ODT) 4 MG disintegrating tablet; Take 1 tablet (4 mg total) by mouth every 6 (six) hours as needed  for nausea or vomiting.  Dispense: 20 tablet; Refill: 0  6. At risk for activity intolerance Virginia Luna was given approximately 15 minutes of exercise intolerance counseling today. She is 59 y.o. female and has risk factors exercise intolerance including obesity, sciatica, and migraine. We discussed intensive lifestyle modifications today with an  emphasis on specific weight loss instructions and strategies. Virginia Luna will slowly increase activity as tolerated. Repetitive spaced learning was employed today to elicit superior memory formation and behavioral change.  7. Class 3 severe obesity with serious comorbidity and body mass index (BMI) of 50.0 to 59.9 in adult, unspecified obesity type ([place]ia Luna)  Virginia Luna's previous GYN left the practice. Will refer to GYN. - Ambulatory referral to Gynecology  Course: Virginia Luna is currently in the action stage of change. As such, her goal is to continue with weight loss efforts.   Nutrition goals: She has agreed to the Category 1 Plan.   Exercise goals: As tolerated.  Behavioral modification strategies: increasing lean protein intake, decreasing simple carbohydrates, increasing vegetables and increasing water intake.  Virginia Luna has agreed to follow-up with our clinic in 3 weeks. She was informed of the importance of frequent follow-up visits to maximize her success with intensive lifestyle modifications for her multiple health conditions.   Objective:   Blood pressure 123/83, pulse 98, temperature 97.7 F (36.5 C), temperature source Oral, height 5\' 1"  (1.549 m), weight 278 lb (126.1 kg), SpO2 98 %. Body mass index is 52.53 kg/m.  General: Cooperative, alert, well developed, in no acute distress. HEENT: Conjunctivae and lids unremarkable. Cardiovascular: Regular rhythm.  Lungs: Normal work of breathing. Neurologic: No focal deficits.   Lab Results  Component Value Date   CREATININE 0.70 02/08/2020   BUN 20 02/08/2020   NA 144 02/08/2020   K 4.1 02/08/2020   CL 100 02/08/2020   CO2 25 02/08/2020   Lab Results  Component Value Date   ALT 21 02/08/2020   AST 12 02/08/2020   GGT 75 (H) 02/08/2020   ALKPHOS 140 (H) 02/08/2020   BILITOT 0.2 02/08/2020   Lab Results  Component Value Date   HGBA1C 6.4 (H) 02/08/2020   Lab Results  Component Value Date   INSULIN 18.5 02/08/2020   INSULIN  15.9 08/27/2019   Lab Results  Component Value Date   TSH 1.600 02/08/2020   Lab Results  Component Value Date   CHOL 176 02/08/2020   HDL 64 02/08/2020   LDLCALC 91 02/08/2020   TRIG 121 02/08/2020   Lab Results  Component Value Date   WBC 12.3 (H) 02/08/2020   HGB 11.8 02/08/2020   HCT 37.7 02/08/2020   MCV 85 02/08/2020   PLT 501 (H) 02/08/2020   Lab Results  Component Value Date   IRON 46 02/08/2020   TIBC 378 02/08/2020   FERRITIN 70 02/08/2020   Attestation Statements:   Reviewed by clinician on day of visit: allergies, medications, problem list, medical history, surgical history, family history, social history, and previous encounter notes.  I, Water quality scientist, CMA, am acting as transcriptionist for Briscoe Deutscher, DO  I have reviewed the above documentation for accuracy and completeness, and I agree with the above. Briscoe Deutscher, DO

## 2020-06-28 DIAGNOSIS — I10 Essential (primary) hypertension: Secondary | ICD-10-CM | POA: Diagnosis not present

## 2020-06-28 DIAGNOSIS — E119 Type 2 diabetes mellitus without complications: Secondary | ICD-10-CM | POA: Diagnosis not present

## 2020-06-28 DIAGNOSIS — J4520 Mild intermittent asthma, uncomplicated: Secondary | ICD-10-CM | POA: Diagnosis not present

## 2020-06-28 DIAGNOSIS — K219 Gastro-esophageal reflux disease without esophagitis: Secondary | ICD-10-CM | POA: Diagnosis not present

## 2020-06-28 DIAGNOSIS — D649 Anemia, unspecified: Secondary | ICD-10-CM | POA: Diagnosis not present

## 2020-06-28 DIAGNOSIS — E7800 Pure hypercholesterolemia, unspecified: Secondary | ICD-10-CM | POA: Diagnosis not present

## 2020-06-29 DIAGNOSIS — J3089 Other allergic rhinitis: Secondary | ICD-10-CM | POA: Diagnosis not present

## 2020-06-29 DIAGNOSIS — J301 Allergic rhinitis due to pollen: Secondary | ICD-10-CM | POA: Diagnosis not present

## 2020-06-29 DIAGNOSIS — J3081 Allergic rhinitis due to animal (cat) (dog) hair and dander: Secondary | ICD-10-CM | POA: Diagnosis not present

## 2020-06-30 DIAGNOSIS — J3089 Other allergic rhinitis: Secondary | ICD-10-CM | POA: Diagnosis not present

## 2020-06-30 DIAGNOSIS — J301 Allergic rhinitis due to pollen: Secondary | ICD-10-CM | POA: Diagnosis not present

## 2020-06-30 DIAGNOSIS — J3081 Allergic rhinitis due to animal (cat) (dog) hair and dander: Secondary | ICD-10-CM | POA: Diagnosis not present

## 2020-07-04 ENCOUNTER — Other Ambulatory Visit (INDEPENDENT_AMBULATORY_CARE_PROVIDER_SITE_OTHER): Payer: Self-pay

## 2020-07-04 DIAGNOSIS — G43809 Other migraine, not intractable, without status migrainosus: Secondary | ICD-10-CM

## 2020-07-06 ENCOUNTER — Telehealth (INDEPENDENT_AMBULATORY_CARE_PROVIDER_SITE_OTHER): Payer: PPO | Admitting: Family Medicine

## 2020-07-06 DIAGNOSIS — E119 Type 2 diabetes mellitus without complications: Secondary | ICD-10-CM | POA: Diagnosis not present

## 2020-07-06 DIAGNOSIS — M5431 Sciatica, right side: Secondary | ICD-10-CM

## 2020-07-06 DIAGNOSIS — J3081 Allergic rhinitis due to animal (cat) (dog) hair and dander: Secondary | ICD-10-CM | POA: Diagnosis not present

## 2020-07-06 DIAGNOSIS — E559 Vitamin D deficiency, unspecified: Secondary | ICD-10-CM | POA: Diagnosis not present

## 2020-07-06 DIAGNOSIS — Z6841 Body Mass Index (BMI) 40.0 and over, adult: Secondary | ICD-10-CM

## 2020-07-06 DIAGNOSIS — G43809 Other migraine, not intractable, without status migrainosus: Secondary | ICD-10-CM

## 2020-07-06 DIAGNOSIS — M5432 Sciatica, left side: Secondary | ICD-10-CM

## 2020-07-06 DIAGNOSIS — E66813 Obesity, class 3: Secondary | ICD-10-CM

## 2020-07-06 DIAGNOSIS — E1169 Type 2 diabetes mellitus with other specified complication: Secondary | ICD-10-CM

## 2020-07-06 DIAGNOSIS — J3089 Other allergic rhinitis: Secondary | ICD-10-CM | POA: Diagnosis not present

## 2020-07-06 DIAGNOSIS — J301 Allergic rhinitis due to pollen: Secondary | ICD-10-CM | POA: Diagnosis not present

## 2020-07-11 MED ORDER — VITAMIN D (ERGOCALCIFEROL) 1.25 MG (50000 UNIT) PO CAPS: 50000.0000 [IU] | ORAL_CAPSULE | ORAL | 0 refills | Status: DC

## 2020-07-11 NOTE — Progress Notes (Signed)
TeleHealth Visit:  Due to the COVID-19 pandemic, this visit was completed with telemedicine (audio/video) technology to reduce patient and provider exposure as well as to preserve personal protective equipment.   Virginia Luna has verbally consented to this TeleHealth visit. The patient is located at home, the provider is located at the [facility] office. The participants in this visit include the listed provider and patient. The visit was conducted today via telephone.  Virginia Luna was unable to use realtime audiovisual technology today and the telehealth visit was conducted via telephone.  Chief Complaint: OBESITY Virginia Luna is here to discuss her progress with her obesity treatment plan along with follow-up of her obesity related diagnoses. Virginia Luna is on the Category 1 Plan and states she is following her eating plan approximately 50% of the time. Virginia Luna states she is not exercising at this time.  Today's visit was #: 15 Starting weight: 288 lbs Starting date: 08/27/2019  Interim History: Virginia Luna has a headache and vertigo today, so a virtual visit was conducted.   Assessment/Plan:   1. Vitamin D deficiency Not at goal. Current vitamin D is 31.1, tested on 02/08/2020. Optimal goal > 50 ng/dL.   Plan:  [x]   Continue Vitamin D @50 ,000 IU every week. []   Continue home supplement daily. [x]   Follow-up for routine testing of Vitamin D at least 2-3 times per year to avoid over-replacement.  - Refill Vitamin D, Ergocalciferol, (DRISDOL) 1.25 MG (50000 UNIT) CAPS capsule; Take 1 capsule (50,000 Units total) by mouth every 7 (seven) days.  Dispense: 4 capsule; Refill: 0  2. Other migraine without status migrainosus, not intractable Virginia Luna is followed by Dr. Tomi Likens in Neurology.  She needs a refill on her Imitrex today.  Plan:  Will refill Imitrex today, per patient request.  3. Bilateral sciatica Virginia Luna is interested in another injection.  Will send message to Dr. Georgina Snell.  4.  Diabetes mellitus without complication (HCC) Diabetes Mellitus: stable. Medication: metformin 500 mg at bedtime. Issues reviewed with her: blood sugar goals, complications of diabetes mellitus, hypoglycemia prevention and treatment, exercise, and nutrition.  Lab Results  Component Value Date   HGBA1C 6.4 (H) 02/08/2020   Lab Results  Component Value Date   LDLCALC 91 02/08/2020   CREATININE 0.70 02/08/2020   5. Class 3 severe obesity with serious comorbidity and body mass index (BMI) of 50.0 to 59.9 in adult, unspecified obesity type (HCC)  Virginia Luna is currently in the action stage of change. As such, her goal is to continue with weight loss efforts. She has agreed to practicing portion control and making smarter food choices, such as increasing vegetables and decreasing simple carbohydrates.   Exercise goals: As tolerated.  She has migraines, sciatica, and vertigo.  Behavioral modification strategies: increasing lean protein intake, decreasing simple carbohydrates, increasing vegetables and increasing water intake.  Virginia Luna has agreed to follow-up with our clinic in 4 weeks. She was informed of the importance of frequent follow-up visits to maximize her success with intensive lifestyle modifications for her multiple health conditions.  Objective:   VITALS: Per patient if applicable, see vitals. GENERAL: Alert and in no acute distress. CARDIOPULMONARY: No increased WOB. Speaking in clear sentences.  PSYCH: Pleasant and cooperative. Speech normal rate and rhythm. Affect is appropriate. Insight and judgement are appropriate. Attention is focused, linear, and appropriate.  NEURO: Oriented as arrived to appointment on time with no prompting.   Lab Results  Component Value Date   CREATININE 0.70 02/08/2020   BUN 20 02/08/2020  NA 144 02/08/2020   K 4.1 02/08/2020   CL 100 02/08/2020   CO2 25 02/08/2020   Lab Results  Component Value Date   ALT 21 02/08/2020   AST 12 02/08/2020    GGT 75 (H) 02/08/2020   ALKPHOS 140 (H) 02/08/2020   BILITOT 0.2 02/08/2020   Lab Results  Component Value Date   HGBA1C 6.4 (H) 02/08/2020   Lab Results  Component Value Date   INSULIN 18.5 02/08/2020   INSULIN 15.9 08/27/2019   Lab Results  Component Value Date   TSH 1.600 02/08/2020   Lab Results  Component Value Date   CHOL 176 02/08/2020   HDL 64 02/08/2020   LDLCALC 91 02/08/2020   TRIG 121 02/08/2020   Lab Results  Component Value Date   WBC 12.3 (H) 02/08/2020   HGB 11.8 02/08/2020   HCT 37.7 02/08/2020   MCV 85 02/08/2020   PLT 501 (H) 02/08/2020   Lab Results  Component Value Date   IRON 46 02/08/2020   TIBC 378 02/08/2020   FERRITIN 70 02/08/2020   Attestation Statements:   Reviewed by clinician on day of visit: allergies, medications, problem list, medical history, surgical history, family history, social history, and previous encounter notes.  Time spent on visit including pre-visit chart review and post-visit charting and care was 35 minutes.   I, Water quality scientist, CMA, am acting as transcriptionist for Briscoe Deutscher, DO  I have reviewed the above documentation for accuracy and completeness, and I agree with the above. Briscoe Deutscher, DO

## 2020-07-12 ENCOUNTER — Telehealth: Payer: Self-pay | Admitting: Family Medicine

## 2020-07-12 ENCOUNTER — Other Ambulatory Visit: Payer: Self-pay

## 2020-07-12 DIAGNOSIS — M5432 Sciatica, left side: Secondary | ICD-10-CM

## 2020-07-12 MED ORDER — SUMATRIPTAN SUCCINATE 100 MG PO TABS: 100.0000 mg | ORAL_TABLET | Freq: Every day | ORAL | 0 refills | Status: DC | PRN

## 2020-07-12 NOTE — Telephone Encounter (Signed)
Called pt and relayed message and provided her w/ phone number for [facility] so she can call and schedule 2nd ESI.

## 2020-07-12 NOTE — Telephone Encounter (Signed)
-----   Message from Wendy Poet, LAT sent at 07/12/2020 11:48 AM EST ----- Please order 2nd ESI per Dr. Juleen China if you agree.  Thanks.

## 2020-07-12 NOTE — Telephone Encounter (Signed)
Pt would like a second ESI.  I will order it now.   Please contact patient.  Advised her to contact [facility] imaging at [phone] to schedule the injection.

## 2020-07-13 ENCOUNTER — Encounter (INDEPENDENT_AMBULATORY_CARE_PROVIDER_SITE_OTHER): Payer: Self-pay | Admitting: Family Medicine

## 2020-07-13 DIAGNOSIS — J3081 Allergic rhinitis due to animal (cat) (dog) hair and dander: Secondary | ICD-10-CM | POA: Diagnosis not present

## 2020-07-13 DIAGNOSIS — J301 Allergic rhinitis due to pollen: Secondary | ICD-10-CM | POA: Diagnosis not present

## 2020-07-13 DIAGNOSIS — J3089 Other allergic rhinitis: Secondary | ICD-10-CM | POA: Diagnosis not present

## 2020-07-18 DIAGNOSIS — I10 Essential (primary) hypertension: Secondary | ICD-10-CM | POA: Diagnosis not present

## 2020-07-18 NOTE — Progress Notes (Deleted)
NEUROLOGY CONSULTATION NOTE  Virginia Luna MRN: [id number] DOB: 10-11-1960  Referring provider: Briscoe Deutscher, DO Primary care provider: Shirline Frees, MD  Reason for consult:  migraines   Subjective:  Virginia Luna is a 59 year old ***-handed female with diabetes, chronic back pain, and IBS who presents for migraines.  History supplemented by referring provider's notes.  Onset:  *** Location:  *** Quality:  *** Intensity:  ***.  *** denies new headache, thunderclap headache or severe headache that wakes *** from sleep. Aura:  *** Prodrome:  *** Postdrome:  *** Associated symptoms:  ***.  *** denies associated unilateral numbness or weakness. Duration:  *** Frequency:  *** Frequency of abortive medication: *** Triggers:  *** Relieving factors:  *** Activity:  ***  CT head on 08/08/2000 was negative.  Current NSAIDS/analgesics:  Hydrocodone-acetaminophen (pain), diclofenac 75mg  Current triptans:  Sumatriptan 100mg  Current ergotamine:  none Current anti-emetic:  Zofran ODT 4mg  Current muscle relaxants:  Cyclobenzaprine 10mg  BID PRN Current Antihypertensive medications:  Metoprolol succinate, amlodipine Current Antidepressant medications:  *** Current Anticonvulsant medications:  gabapenin 300mg  QHS Current anti-CGRP:  *** Current Vitamins/Herbal/Supplements:  D Current Antihistamines/Decongestants:  Meclizine 25mg  PRN, Benadryl 50mg  QHS, Flonase Other therapy:  *** Hormone/birth control:  *** Other medications:  Ambien  Past NSAIDS/analgesics:  *** Past abortive triptans:  *** Past abortive ergotamine:  none Past muscle relaxants:  none Past anti-emetic:  Promethazine 25mg  Past antihypertensive medications:  *** Past antidepressant medications:  *** Past anticonvulsant medications:  *** Past anti-CGRP:  none Past vitamins/Herbal/Supplements:  none Past antihistamines/decongestants:  none Other past therapies:  ***  Caffeine:  *** Alcohol:   *** Smoker:  *** Diet:  *** Exercise:  *** Depression:  ***; Anxiety:  *** Other pain:  *** Sleep hygiene:  *** Family history of headache:  ***      PAST MEDICAL HISTORY: Past Medical History:  Diagnosis Date  . Allergies   . Anemia   . Arthritis   . Asthma   . Back pain   . Chronic pain   . Diabetes ([place])   . Diabetes mellitus without complication ([place])   . Edema, lower extremity   . Fibromyalgia   . High blood pressure   . History of stomach ulcers   . IBS (irritable bowel syndrome)   . Joint pain   . Sleep apnea    did use cpap-lost 25lb-says she does not need it  . Wears contact lenses     PAST SURGICAL HISTORY: Past Surgical History:  Procedure Laterality Date  . BREAST BIOPSY    . BREAST EXCISIONAL BIOPSY    . BREAST LUMPECTOMY WITH RADIOACTIVE SEED LOCALIZATION Bilateral 11/24/2014   Procedure: BILATERAL BREAST LUMPECTOMY WITH RADIOACTIVE SEED LOCALIZATION;  Surgeon: [name], MD;  Location: [place];  Service: General;  Laterality: Bilateral;  . CHOLECYSTECTOMY    . COLONOSCOPY    . DILATION AND CURETTAGE OF UTERUS    . ESOPHAGOGASTRODUODENOSCOPY (EGD) WITH PROPOFOL N/A 07/23/2016   Procedure: ESOPHAGOGASTRODUODENOSCOPY (EGD) WITH PROPOFOL;  Surgeon: Laurence Spates, MD;  Location: [facility];  Service: Endoscopy;  Laterality: N/A;  . ESOPHAGOGASTRODUODENOSCOPY (EGD) WITH PROPOFOL N/A 06/11/2018   Procedure: ESOPHAGOGASTRODUODENOSCOPY (EGD) WITH PROPOFOL;  Surgeon: Laurence Spates, MD;  Location: [facility];  Service: Endoscopy;  Laterality: N/A;  . LUMBAR LAMINECTOMY  2010  . UMBILICAL HERNIA REPAIR     age 52  . UPPER GI ENDOSCOPY      MEDICATIONS: Current Outpatient Medications on File Prior to  Visit  Medication Sig Dispense Refill  . AFLURIA QUADRIVALENT 0.5 ML injection     . albuterol (PROVENTIL HFA;VENTOLIN HFA) 108 (90 BASE) MCG/ACT inhaler Inhale 2 puffs into the lungs every 6 (six) hours as needed for wheezing or  shortness of breath.     Marland Kitchen amLODipine (NORVASC) 5 MG tablet Take 5 mg by mouth daily.    Marland Kitchen atorvastatin (LIPITOR) 10 MG tablet Take 10 mg by mouth at bedtime.    Marland Kitchen azelastine (ASTELIN) 0.1 % nasal spray Place 1 spray into both nostrils 2 (two) times daily as needed for allergies.  2  . Azelastine-Fluticasone (DYMISTA) 137-50 MCG/ACT SUSP Place 1 spray into the nose at bedtime.    . cyclobenzaprine (FLEXERIL) 10 MG tablet Take 10 mg by mouth 2 (two) times daily as needed for muscle spasms.     . diclofenac (VOLTAREN) 75 MG EC tablet     . diphenhydrAMINE (BENADRYL) 25 MG tablet Take 50 mg by mouth at bedtime.    Marland Kitchen EPINEPHrine (EPIPEN 2-PAK) 0.3 mg/0.3 mL IJ SOAJ injection Inject 0.3 mg into the muscle once.    . fexofenadine (ALLEGRA) 180 MG tablet Take 180 mg by mouth daily.  5  . fluticasone (FLONASE) 50 MCG/ACT nasal spray INSTILL 1 SPRAY IN EACH NOSTRIL TWICE A DAY    . fluticasone furoate-vilanterol (BREO ELLIPTA) 200-25 MCG/INH AEPB Inhale 1 puff daily into the lungs.    . gabapentin (NEURONTIN) 300 MG capsule Take 300 mg by mouth at bedtime.     Marland Kitchen HYDROcodone-acetaminophen (NORCO/VICODIN) 5-325 MG tablet Take 1 tablet by mouth 2 (two) times daily as needed for moderate pain.   0  . Insulin Pen Needle (BD PEN NEEDLE NANO 2ND GEN) 32G X 4 MM MISC 1 Package by Does not apply route daily. 100 each 0  . LINZESS 72 MCG capsule TAKE ONE CAPSULE BY MOUTH every morning 90 capsule 0  . meclizine (ANTIVERT) 25 MG tablet Take 1 tablet (25 mg total) by mouth every 8 (eight) hours as needed for dizziness. 30 tablet 0  . Melatonin 5 MG CAPS Take 10-15 mg by mouth at bedtime.    . metFORMIN (GLUCOPHAGE-XR) 500 MG 24 hr tablet Take 500 mg by mouth at bedtime.     . metoprolol succinate (TOPROL-XL) 25 MG 24 hr tablet Take 25 mg by mouth every morning.    . ondansetron (ZOFRAN ODT) 4 MG disintegrating tablet Take 1 tablet (4 mg total) by mouth every 6 (six) hours as needed for nausea or vomiting. 20 tablet 0   . oxybutynin (DITROPAN) 5 MG tablet Take 5 mg by mouth 2 (two) times daily as needed for bladder spasms.  0  . oxyCODONE (OXY IR/ROXICODONE) 5 MG immediate release tablet Take 5 mg by mouth 2 (two) times daily as needed for pain.  0  . OZEMPIC, 1 MG/DOSE, 2 MG/1.5ML SOPN Inject 1 mg into the skin once a week. 3 mL 0  . pantoprazole (PROTONIX) 40 MG tablet Take 40 mg by mouth 2 (two) times daily.  1  . promethazine (PHENERGAN) 25 MG tablet Take 25 mg by mouth every 6 (six) hours as needed for nausea or vomiting.    Marland Kitchen QUEtiapine (SEROQUEL) 300 MG tablet TAKE ONE TABLET BY MOUTH EVERYDAY AT BEDTIME 90 tablet 2  . SUMAtriptan (IMITREX) 100 MG tablet Take 1 tablet (100 mg total) by mouth daily as needed for migraine. May repeat in 2 hours if headache persists or recurs. 10 tablet 0  .  torsemide (DEMADEX) 20 MG tablet Take 20 mg by mouth daily as needed (swelling).    . triamcinolone cream (KENALOG) 0.1 % Apply 1 application topically 2 (two) times daily as needed for rash.  0  . Vitamin D, Ergocalciferol, (DRISDOL) 1.25 MG (50000 UNIT) CAPS capsule Take 1 capsule (50,000 Units total) by mouth every 7 (seven) days. 4 capsule 0  . zolpidem (AMBIEN) 10 MG tablet TAKE ONE TABLET BY MOUTH EVERYDAY AT BEDTIME 30 tablet 4   No current facility-administered medications on file prior to visit.    ALLERGIES: Allergies  Allergen Reactions  . Rocephin [Ceftriaxone] Anaphylaxis  . Morphine And Related Nausea And Vomiting    Doesn't work  . Prednisone Itching and Swelling  . Sulfa Antibiotics Itching and Swelling    FAMILY HISTORY: Family History  Problem Relation Age of Onset  . Breast cancer Paternal Aunt   . Breast cancer Paternal Grandmother   . Obesity Mother   . Diabetes Father   . High blood pressure Father   . Sudden death Father    ***.  SOCIAL HISTORY: Social History   Socioeconomic History  . Marital status: Single    Spouse name: Not on file  . Number of children: Not on file   . Years of education: Not on file  . Highest education level: Not on file  Occupational History  . Occupation: disabled    Comment: Psychologist, occupational at daycare  Tobacco Use  . Smoking status: Never Smoker  . Smokeless tobacco: Never Used  Substance and Sexual Activity  . Alcohol use: No  . Drug use: No  . Sexual activity: Not on file  Other Topics Concern  . Not on file  Social History Narrative  . Not on file   Social Determinants of Health   Financial Resource Strain:   . Difficulty of Paying Living Expenses: Not on file  Food Insecurity:   . Worried About Charity fundraiser in the Last Year: Not on file  . Ran Out of Food in the Last Year: Not on file  Transportation Needs:   . Lack of Transportation (Medical): Not on file  . Lack of Transportation (Non-Medical): Not on file  Physical Activity:   . Days of Exercise per Week: Not on file  . Minutes of Exercise per Session: Not on file  Stress:   . Feeling of Stress : Not on file  Social Connections:   . Frequency of Communication with Friends and Family: Not on file  . Frequency of Social Gatherings with Friends and Family: Not on file  . Attends Religious Services: Not on file  . Active Member of Clubs or Organizations: Not on file  . Attends Archivist Meetings: Not on file  . Marital Status: Not on file  Intimate Partner Violence:   . Fear of Current or Ex-Partner: Not on file  . Emotionally Abused: Not on file  . Physically Abused: Not on file  . Sexually Abused: Not on file    Objective:  *** General: No acute distress.  Patient appears well-groomed.   Head:  Normocephalic/atraumatic Eyes:  fundi examined but not visualized Neck: supple, no paraspinal tenderness, full range of motion Back: No paraspinal tenderness Heart: regular rate and rhythm Lungs: Clear to auscultation bilaterally. Vascular: No carotid bruits. Neurological Exam: Mental status: alert and oriented to person, place, and time,  recent and remote memory intact, fund of knowledge intact, attention and concentration intact, speech fluent and not dysarthric, language intact.  Cranial nerves: CN I: not tested CN II: pupils equal, round and reactive to light, visual fields intact CN III, IV, VI:  full range of motion, no nystagmus, no ptosis CN V: facial sensation intact. CN VII: upper and lower face symmetric CN VIII: hearing intact CN IX, X: gag intact, uvula midline CN XI: sternocleidomastoid and trapezius muscles intact CN XII: tongue midline Bulk & Tone: normal, no fasciculations. Motor:  muscle strength 5/5 throughout Sensation:  Pinprick, temperature and vibratory sensation intact. Deep Tendon Reflexes:  2+ throughout,  toes downgoing.   Finger to nose testing:  Without dysmetria.   Heel to shin:  Without dysmetria.   Gait:  Normal station and stride.  Romberg negative.  Assessment/Plan:   ***    Thank you for allowing me to take part in the care of this patient.  Metta Clines, DO  CC:  Shirline Frees, MD  Briscoe Deutscher, DO

## 2020-07-19 ENCOUNTER — Ambulatory Visit: Payer: PPO | Admitting: Neurology

## 2020-07-20 ENCOUNTER — Telehealth (INDEPENDENT_AMBULATORY_CARE_PROVIDER_SITE_OTHER): Payer: PPO | Admitting: Psychiatry

## 2020-07-20 ENCOUNTER — Encounter: Payer: Self-pay | Admitting: Psychiatry

## 2020-07-20 DIAGNOSIS — F419 Anxiety disorder, unspecified: Secondary | ICD-10-CM

## 2020-07-20 DIAGNOSIS — F5105 Insomnia due to other mental disorder: Secondary | ICD-10-CM | POA: Diagnosis not present

## 2020-07-20 DIAGNOSIS — E559 Vitamin D deficiency, unspecified: Secondary | ICD-10-CM | POA: Diagnosis not present

## 2020-07-20 DIAGNOSIS — F450 Somatization disorder: Secondary | ICD-10-CM

## 2020-07-20 MED ORDER — QUETIAPINE FUMARATE 300 MG PO TABS: ORAL_TABLET | ORAL | 3 refills | Status: DC

## 2020-07-20 MED ORDER — ZOLPIDEM TARTRATE 10 MG PO TABS: 10.0000 mg | ORAL_TABLET | Freq: Every evening | ORAL | 5 refills | Status: DC | PRN

## 2020-07-20 NOTE — Progress Notes (Signed)
Virginia Luna [id number] 1961/04/13 59 y.o.  Virtual Visit via [place]  I connected with pt by WebEx and verified that I am speaking with the correct person using two identifiers.   I discussed the limitations, risks, security and privacy concerns of performing an evaluation and management service by [facility]ul[facility] and the availability of in person appointments. I also discussed with the patient that there may be a patient responsible charge related to this service. The patient expressed understanding and agreed to proceed.  I discussed the assessment and treatment plan with the patient. The patient was provided an opportunity to ask questions and all were answered. The patient agreed with the plan and demonstrated an understanding of the instructions.   The patient was advised to call back or seek an in-person evaluation if the symptoms worsen or if the condition fails to improve as anticipated.  I provided 30 minutes of video time during this encounter. The call started at 930 and ended at 10:00. The patient was located at home and the provider was located office.   Subjective:   Patient ID:  Virginia Luna is a 59 y.o. (DOB 1960/11/25) female.  Chief Complaint:  Chief Complaint  Patient presents with   Follow-up   Anxiety   Stress    health    HPI Virginia Luna presents for follow-up of chronic insomnia.  Last seen May 2020 & March 2021.  No meds were changed  07/20/20 appt with following noted: No Covid. Vaccinated.  Pain in back with injections and problems with migraine.  Maintaining OK.  Isolating and wearing mask.  Outside church when possible.  Not depressed.    Sleep affected by pain off and on. Sleep is OK unless pain interferes.  Some good sleep is better than none.  If can not stay asleep then will get up and work a puzzle and then go back to sleep.    Awakens in pain from FM and related to fall. Trying to keep up with exercises.   Pain is still high more often than it  should be.   Seeing Dr. Juleen China about weight loss.  No new meds.  Just finished PT from the last sig fall.    Patient reports stable mood and denies depressed or irritable moods.  Patient denies any recent difficulty with anxiety.  Patient denies difficulty with sleep initiation but does with maintenance. Denies appetite disturbance.  Patient reports that energy and motivation have been good.  Patient denies any difficulty with concentration.  Patient denies any suicidal ideation.   Wears mask pretty much all the time even at home.   Past Psychiatric Medication Trials: Doxepin, Rozerem,  Amitriptyline, mirtazapine, Ambien, quetiapine olanzapine Lyrica duloxetine gabapentin, cyclobenzaprine  Review of Systems:  Review of Systems  HENT: Positive for postnasal drip, rhinorrhea and sneezing.   Musculoskeletal: Positive for back pain, myalgias and neck pain.  Neurological: Positive for weakness. Negative for tremors.   Medications: I have reviewed the patient's current medications.  Current Outpatient Medications  Medication Sig Dispense Refill   AFLURIA QUADRIVALENT 0.5 ML injection      albuterol (PROVENTIL HFA;VENTOLIN HFA) 108 (90 BASE) MCG/ACT inhaler Inhale 2 puffs into the lungs every 6 (six) hours as needed for wheezing or shortness of breath.      amLODipine (NORVASC) 5 MG tablet Take 5 mg by mouth daily.     atorvastatin (LIPITOR) 10 MG tablet Take 10 mg by mouth at bedtime.     azelastine (ASTELIN) 0.1 %  nasal spray Place 1 spray into both nostrils 2 (two) times daily as needed for allergies.  2   Azelastine-Fluticasone (DYMISTA) 137-50 MCG/ACT SUSP Place 1 spray into the nose at bedtime.     cyclobenzaprine (FLEXERIL) 10 MG tablet Take 10 mg by mouth 2 (two) times daily as needed for muscle spasms.      diclofenac (VOLTAREN) 75 MG EC tablet      diphenhydrAMINE (BENADRYL) 25 MG tablet Take 50 mg by mouth at bedtime.     EPINEPHrine (EPIPEN 2-PAK) 0.3 mg/0.3 mL IJ SOAJ  injection Inject 0.3 mg into the muscle once.     fexofenadine (ALLEGRA) 180 MG tablet Take 180 mg by mouth daily.  5   fluticasone (FLONASE) 50 MCG/ACT nasal spray INSTILL 1 SPRAY IN EACH NOSTRIL TWICE A DAY     fluticasone furoate-vilanterol (BREO ELLIPTA) 200-25 MCG/INH AEPB Inhale 1 puff daily into the lungs.     gabapentin (NEURONTIN) 300 MG capsule Take 300 mg by mouth at bedtime.      HYDROcodone-acetaminophen (NORCO/VICODIN) 5-325 MG tablet Take 1 tablet by mouth 2 (two) times daily as needed for moderate pain.   0   Insulin Pen Needle (BD PEN NEEDLE NANO 2ND GEN) 32G X 4 MM MISC 1 Package by Does not apply route daily. 100 each 0   LINZESS 72 MCG capsule TAKE ONE CAPSULE BY MOUTH every morning 90 capsule 0   meclizine (ANTIVERT) 25 MG tablet Take 1 tablet (25 mg total) by mouth every 8 (eight) hours as needed for dizziness. 30 tablet 0   Melatonin 5 MG CAPS Take 10-15 mg by mouth at bedtime.     metFORMIN (GLUCOPHAGE-XR) 500 MG 24 hr tablet Take 500 mg by mouth at bedtime.      metoprolol succinate (TOPROL-XL) 25 MG 24 hr tablet Take 25 mg by mouth every morning.     ondansetron (ZOFRAN ODT) 4 MG disintegrating tablet Take 1 tablet (4 mg total) by mouth every 6 (six) hours as needed for nausea or vomiting. 20 tablet 0   oxybutynin (DITROPAN) 5 MG tablet Take 5 mg by mouth 2 (two) times daily as needed for bladder spasms.  0   oxyCODONE (OXY IR/ROXICODONE) 5 MG immediate release tablet Take 5 mg by mouth 2 (two) times daily as needed for pain.  0   OZEMPIC, 1 MG/DOSE, 2 MG/1.5ML SOPN Inject 1 mg into the skin once a week. 3 mL 0   pantoprazole (PROTONIX) 40 MG tablet Take 40 mg by mouth 2 (two) times daily.  1   promethazine (PHENERGAN) 25 MG tablet Take 25 mg by mouth every 6 (six) hours as needed for nausea or vomiting.     QUEtiapine (SEROQUEL) 300 MG tablet TAKE ONE TABLET BY MOUTH EVERYDAY AT BEDTIME 90 tablet 3   SUMAtriptan (IMITREX) 100 MG tablet Take 1 tablet  (100 mg total) by mouth daily as needed for migraine. May repeat in 2 hours if headache persists or recurs. 10 tablet 0   torsemide (DEMADEX) 20 MG tablet Take 20 mg by mouth daily as needed (swelling).     triamcinolone cream (KENALOG) 0.1 % Apply 1 application topically 2 (two) times daily as needed for rash.  0   Vitamin D, Ergocalciferol, (DRISDOL) 1.25 MG (50000 UNIT) CAPS capsule Take 1 capsule (50,000 Units total) by mouth every 7 (seven) days. 4 capsule 0   zolpidem (AMBIEN) 10 MG tablet Take 1 tablet (10 mg total) by mouth at bedtime as needed for  sleep. 30 tablet 5   No current facility-administered medications for this visit.    Medication Side Effects: None  Allergies:  Allergies  Allergen Reactions   Rocephin [Ceftriaxone] Anaphylaxis   Morphine And Related Nausea And Vomiting    Doesn't work   Prednisone Itching and Swelling   Sulfa Antibiotics Itching and Swelling    Past Medical History:  Diagnosis Date   Allergies    Anemia    Arthritis    Asthma    Back pain    Chronic pain    Diabetes ([place])    Diabetes mellitus without complication (HCC)    Edema, lower extremity    Fibromyalgia    High blood pressure    History of stomach ulcers    IBS (irritable bowel syndrome)    Joint pain    Sleep apnea    did use cpap-lost 25lb-says she does not need it   Wears contact lenses     Family History  Problem Relation Age of Onset   Breast cancer Paternal 67    Breast cancer Paternal Grandmother    Obesity Mother    Diabetes Father    High blood pressure Father    Sudden death Father     Social History   Socioeconomic History   Marital status: Single    Spouse name: Not on file   Number of children: Not on file   Years of education: Not on file   Highest education level: Not on file  Occupational History   Occupation: disabled    Comment: Psychologist, occupational at daycare  Tobacco Use   Smoking status: Never Smoker    Smokeless tobacco: Never Used  Substance and Sexual Activity   Alcohol use: No   Drug use: No   Sexual activity: Not on file  Other Topics Concern   Not on file  Social History Narrative   Not on file   Social Determinants of Health   Financial Resource Strain:    Difficulty of Paying Living Expenses: Not on file  Food Insecurity:    Worried About Charity fundraiser in the Last Year: Not on file   [employer] of Food in the Last Year: Not on file  Transportation Needs:    Lack of Transportation (Medical): Not on file   Lack of Transportation (Non-Medical): Not on file  Physical Activity:    Days of Exercise per Week: Not on file   Minutes of Exercise per Session: Not on file  Stress:    Feeling of Stress : Not on file  Social Connections:    Frequency of Communication with Friends and Family: Not on file   Frequency of Social Gatherings with Friends and Family: Not on file   Attends Religious Services: Not on file   Active Member of Clubs or Organizations: Not on file   Attends Archivist Meetings: Not on file   Marital Status: Not on file  Intimate Partner Violence:    Fear of Current or Ex-Partner: Not on file   Emotionally Abused: Not on file   Physically Abused: Not on file   Sexually Abused: Not on file    Past Medical History, Surgical history, Social history, and Family history were reviewed and updated as appropriate.   Please see review of systems for further details on the patient's review from today.   Objective:   Physical Exam:  LMP  (LMP Unknown)   Physical Exam Neurological:     Mental Status: She is  alert and oriented to person, place, and time.     Cranial Nerves: No dysarthria.  Psychiatric:        Attention and Perception: Attention normal. She does not perceive auditory hallucinations.        Mood and Affect: Mood is anxious. Mood is not depressed. Affect is not tearful.        Speech: Speech normal.         Behavior: Behavior is cooperative.        Thought Content: Thought content normal. Thought content is not paranoid or delusional. Thought content does not include homicidal or suicidal ideation. Thought content does not include homicidal or suicidal plan.        Cognition and Memory: Cognition and memory normal.        Judgment: Judgment normal.     Comments: Chronic somatic focus ongoing. Insight fair. Talkative per usual.     Lab Review:     Component Value Date/Time   NA 144 02/08/2020 1249   K 4.1 02/08/2020 1249   CL 100 02/08/2020 1249   CO2 25 02/08/2020 1249   GLUCOSE 94 02/08/2020 1249   GLUCOSE 107 (H) 07/02/2016 2205   BUN 20 02/08/2020 1249   CREATININE 0.70 02/08/2020 1249   CALCIUM 9.6 02/08/2020 1249   PROT 7.7 02/08/2020 1249   ALBUMIN 4.8 02/08/2020 1249   AST 12 02/08/2020 1249   ALT 21 02/08/2020 1249   ALKPHOS 140 (H) 02/08/2020 1249   BILITOT 0.2 02/08/2020 1249   GFRNONAA 96 02/08/2020 1249   GFRAA 111 02/08/2020 1249       Component Value Date/Time   WBC 12.3 (H) 02/08/2020 1249   WBC 11.1 (H) 07/02/2016 2204   RBC 4.46 02/08/2020 1249   RBC 4.58 07/02/2016 2204   HGB 11.8 02/08/2020 1249   HCT 37.7 02/08/2020 1249   PLT 501 (H) 02/08/2020 1249   MCV 85 02/08/2020 1249   MCH 26.5 (L) 02/08/2020 1249   MCH 25.8 (L) 07/02/2016 2204   MCHC 31.3 (L) 02/08/2020 1249   MCHC 31.4 07/02/2016 2204   RDW 15.8 (H) 02/08/2020 1249   LYMPHSABS 2.3 02/08/2020 1249   MONOABS 0.3 10/13/2008 1230   EOSABS 0.3 02/08/2020 1249   BASOSABS 0.1 02/08/2020 1249    No results found for: POCLITH, LITHIUM   No results found for: PHENYTOIN, PHENOBARB, VALPROATE, CBMZ   .res Assessment: Plan:    Anxiety with somatization - Plan: QUEtiapine (SEROQUEL) 300 MG tablet  Insomnia due to mental condition - Plan: QUEtiapine (SEROQUEL) 300 MG tablet, zolpidem (AMBIEN) 10 MG tablet  Vitamin D deficiency   We discussed the short-term risks associated with  benzodiazepines including sedation and increased fall risk among others.  Discussed long-term side effect risk including dependence, potential withdrawal symptoms, and the potential eventual dose-related risk of dementia. Discussed the possibility of splitting the zolpidem between sleep onset and then with the early morning awakening.  She says she is tried that and does better about taking it all at once first thing going to bed.  We discussed the risk of amnesia.  She is not had any problems with that.  Discussed potential metabolic side effects associated with atypical antipsychotics, as well as potential risk for movement side effects. Advised pt to contact office if movement side effects occur.  She has required quetiapine for chronic severe insomnia that is related to both anxiety and to fibromyalgia.  She gets clear benefit from quetiapine.  She is at the lowest  effective dose.  We have tried to reduce dosages.  She is tolerating it well.  Continue quetiapine 300 mg HS and Ambien 10 mg HS.  Cannot sleep without these meds.  Quetiapine likely having positive mood and anxiety affect also.  Disc low vitamin D dx and importance of it for Covid protection and mental health reasons.    Follow-up 12 months because of stability.  She has been on the same meds at the same dosage for several years.  Lynder Parents, MD, DFAPA   Please see After Visit Summary for patient specific instructions.  Future Appointments  Date Time Provider [place]  07/22/2020 12:30 PM [facility] RM 3 [facility]  08/03/2020 10:00 AM Briscoe Deutscher, DO [facility] None  09/26/2020  7:50 AM Pieter Partridge, DO [facility] None  10/06/2020 11:00 AM Salvadore Dom, MD [place] None    No orders of the defined types were placed in this encounter.     -------------------------------

## 2020-07-22 ENCOUNTER — Other Ambulatory Visit: Payer: PPO

## 2020-07-22 DIAGNOSIS — J3089 Other allergic rhinitis: Secondary | ICD-10-CM | POA: Diagnosis not present

## 2020-07-22 DIAGNOSIS — J301 Allergic rhinitis due to pollen: Secondary | ICD-10-CM | POA: Diagnosis not present

## 2020-07-22 DIAGNOSIS — J3081 Allergic rhinitis due to animal (cat) (dog) hair and dander: Secondary | ICD-10-CM | POA: Diagnosis not present

## 2020-07-26 ENCOUNTER — Telehealth (INDEPENDENT_AMBULATORY_CARE_PROVIDER_SITE_OTHER): Payer: Self-pay

## 2020-07-26 DIAGNOSIS — G43809 Other migraine, not intractable, without status migrainosus: Secondary | ICD-10-CM

## 2020-07-26 NOTE — Telephone Encounter (Signed)
[facility] located at [address] called stating that patient is requesting her medications ZOFRAN, and ANTIVERT, the pharmacy's fax number is [phone].

## 2020-07-26 NOTE — Telephone Encounter (Signed)
This patient was last seen by Dr. Juleen China, and currently has an upcoming appt scheduled on 08/03/20 with her.

## 2020-07-27 DIAGNOSIS — E119 Type 2 diabetes mellitus without complications: Secondary | ICD-10-CM | POA: Diagnosis not present

## 2020-07-27 DIAGNOSIS — J3081 Allergic rhinitis due to animal (cat) (dog) hair and dander: Secondary | ICD-10-CM | POA: Diagnosis not present

## 2020-07-27 DIAGNOSIS — K219 Gastro-esophageal reflux disease without esophagitis: Secondary | ICD-10-CM | POA: Diagnosis not present

## 2020-07-27 DIAGNOSIS — E7800 Pure hypercholesterolemia, unspecified: Secondary | ICD-10-CM | POA: Diagnosis not present

## 2020-07-27 DIAGNOSIS — J301 Allergic rhinitis due to pollen: Secondary | ICD-10-CM | POA: Diagnosis not present

## 2020-07-27 DIAGNOSIS — I10 Essential (primary) hypertension: Secondary | ICD-10-CM | POA: Diagnosis not present

## 2020-07-27 DIAGNOSIS — J3089 Other allergic rhinitis: Secondary | ICD-10-CM | POA: Diagnosis not present

## 2020-07-27 DIAGNOSIS — J4520 Mild intermittent asthma, uncomplicated: Secondary | ICD-10-CM | POA: Diagnosis not present

## 2020-07-27 DIAGNOSIS — D649 Anemia, unspecified: Secondary | ICD-10-CM | POA: Diagnosis not present

## 2020-07-27 MED ORDER — MECLIZINE HCL 25 MG PO TABS: 25.0000 mg | ORAL_TABLET | Freq: Three times a day (TID) | ORAL | 0 refills | Status: DC | PRN

## 2020-07-27 MED ORDER — ONDANSETRON 4 MG PO TBDP: 4.0000 mg | ORAL_TABLET | Freq: Four times a day (QID) | ORAL | 0 refills | Status: DC | PRN

## 2020-07-27 NOTE — Telephone Encounter (Signed)
Your sent courtesy refills at 11/03 OV, would you like pt to contact neuro for refill?

## 2020-07-27 NOTE — Telephone Encounter (Signed)
Okay to fill? 

## 2020-07-29 ENCOUNTER — Ambulatory Visit
Admission: RE | Admit: 2020-07-29 | Discharge: 2020-07-29 | Disposition: A | Discharge status: Home / Self Care | Payer: PPO | Source: Ambulatory Visit | Attending: Family Medicine | Admitting: Family Medicine

## 2020-07-29 DIAGNOSIS — M5432 Sciatica, left side: Secondary | ICD-10-CM

## 2020-07-29 DIAGNOSIS — M47817 Spondylosis without myelopathy or radiculopathy, lumbosacral region: Secondary | ICD-10-CM | POA: Diagnosis not present

## 2020-07-29 MED ORDER — METHYLPREDNISOLONE ACETATE 40 MG/ML INJ SUSP (RADIOLOG
120.0000 mg | Freq: Once | INTRAMUSCULAR | Status: AC
  Administered 2020-07-29: 120 mg via EPIDURAL

## 2020-07-29 MED ORDER — IOPAMIDOL (ISOVUE-M 200) INJECTION 41%
1.0000 mL | Freq: Once | INTRAMUSCULAR | Status: AC
  Administered 2020-07-29: 1 mL via EPIDURAL

## 2020-07-29 NOTE — Discharge Instructions (Signed)

## 2020-08-03 ENCOUNTER — Encounter (INDEPENDENT_AMBULATORY_CARE_PROVIDER_SITE_OTHER): Payer: Self-pay | Admitting: Family Medicine

## 2020-08-03 ENCOUNTER — Ambulatory Visit (INDEPENDENT_AMBULATORY_CARE_PROVIDER_SITE_OTHER): Payer: PPO | Admitting: Family Medicine

## 2020-08-03 ENCOUNTER — Other Ambulatory Visit: Payer: Self-pay

## 2020-08-03 VITALS — BP 138/85 | HR 97 | Temp 98.1°F | Ht 61.0 in | Wt 287.0 lb

## 2020-08-03 DIAGNOSIS — R110 Nausea: Secondary | ICD-10-CM | POA: Diagnosis not present

## 2020-08-03 DIAGNOSIS — G8929 Other chronic pain: Secondary | ICD-10-CM | POA: Diagnosis not present

## 2020-08-03 DIAGNOSIS — R519 Headache, unspecified: Secondary | ICD-10-CM

## 2020-08-03 DIAGNOSIS — F3289 Other specified depressive episodes: Secondary | ICD-10-CM | POA: Diagnosis not present

## 2020-08-03 DIAGNOSIS — M545 Low back pain, unspecified: Secondary | ICD-10-CM

## 2020-08-03 DIAGNOSIS — Z6841 Body Mass Index (BMI) 40.0 and over, adult: Secondary | ICD-10-CM

## 2020-08-03 DIAGNOSIS — J3089 Other allergic rhinitis: Secondary | ICD-10-CM | POA: Diagnosis not present

## 2020-08-03 DIAGNOSIS — E1169 Type 2 diabetes mellitus with other specified complication: Secondary | ICD-10-CM

## 2020-08-03 DIAGNOSIS — J3081 Allergic rhinitis due to animal (cat) (dog) hair and dander: Secondary | ICD-10-CM | POA: Diagnosis not present

## 2020-08-03 DIAGNOSIS — J301 Allergic rhinitis due to pollen: Secondary | ICD-10-CM | POA: Diagnosis not present

## 2020-08-03 MED ORDER — OZEMPIC (0.25 OR 0.5 MG/DOSE) 2 MG/1.5ML ~~LOC~~ SOPN: 0.5000 mg | PEN_INJECTOR | SUBCUTANEOUS | 0 refills | Status: DC

## 2020-08-03 NOTE — Progress Notes (Signed)
NEUROLOGY CONSULTATION NOTE  Virginia Luna MRN: [id number] DOB: [birth year]  Referring provider: Briscoe Deutscher, DO Primary care provider: Shirline Frees, MD  Reason for consult:  migraines   Subjective:  Virginia Luna is a 59 year old right-handed female with diabetes, chronic back pain, and IBS who presents for migraines.  History supplemented by referring provider's notes.  She has had migraines for many years.  They were manageable, usually occurring once a month.  They started to become more frequent in 2020, progressing to the point that she now has a persistent daily headache.  They are typically bifrontal and pounding, associated with nausea, vomiting, vertigo, photophobia, phonophobia and blurred vision but no numbness or weakness.  Intensity will fluctuate from dull-moderate to severe about once a week for 2-3 days.  Sunlight, loud noise and quick movements are aggravating factors.  Resting in a dark and cool quiet room helps relieve pain.  She cannot really identify a specific trigger for her chronic daily headache but it may have followed a fall in which she tripped and hit her head.  She treats headache with sumatriptan (2-3 days a week) and treats nausea with Zofran and dizziness with meclizine.  She also takes hydrocodone for fibromyalgia.    CT head on 08/08/2000 was negative.  Current NSAIDS/analgesics:  Hydrocodone-acetaminophen (pain), diclofenac 75mg  Current triptans:  Sumatriptan 100mg  - 2 to 3 days a week Current ergotamine:  none Current anti-emetic:  Zofran ODT 4mg  Current muscle relaxants:  Cyclobenzaprine 10mg  BID PRN Current Antihypertensive medications:  Metoprolol succinate, amlodipine Current Antidepressant medications:  none Current Anticonvulsant medications:  gabapenin 300mg  QHS Current anti-CGRP:  none Current Vitamins/Herbal/Supplements:  D, melatonin Current Antihistamines/Decongestants:  Meclizine 25mg  PRN, Benadryl 50mg  QHS, Flonase Other  therapy:  ice Hormone/birth control:  none Other medications:  Ambien  Past NSAIDS/analgesics:  Ibuprofen, naproxen, Excedrin/BC/Goody Past abortive triptans:  none Past abortive ergotamine:  none Past muscle relaxants:  none Past anti-emetic:  Promethazine 25mg  Past antihypertensive medications:  none Past antidepressant medications:  Amitriptyline or nortriptyline, venlafaxine Past anticonvulsant medications:  topiramate Past anti-CGRP:  none Past vitamins/Herbal/Supplements:  none Past antihistamines/decongestants:  none Other past therapies:  none  Caffeine:  No coffee.  Occasional Coke Diet:  1 gallon water daily.  Tries not to skip meals.   Exercise:  Unable due to pain, nausea and dizziness Depression:  no; Anxiety:  no Other pain:  fibromyalgia Sleep hygiene:  varies Family history of headache:  unknown    PAST MEDICAL HISTORY: Past Medical History:  Diagnosis Date  . Allergies   . Anemia   . Arthritis   . Asthma   . Back pain   . Chronic pain   . Diabetes ([place])   . Diabetes mellitus without complication ([place])   . Edema, lower extremity   . Fibromyalgia   . High blood pressure   . History of stomach ulcers   . IBS (irritable bowel syndrome)   . Joint pain   . Sleep apnea    did use cpap-lost 25lb-says she does not need it  . Wears contact lenses     PAST SURGICAL HISTORY: Past Surgical History:  Procedure Laterality Date  . BREAST BIOPSY    . BREAST EXCISIONAL BIOPSY    . BREAST LUMPECTOMY WITH RADIOACTIVE SEED LOCALIZATION Bilateral 11/24/2014   Procedure: BILATERAL BREAST LUMPECTOMY WITH RADIOACTIVE SEED LOCALIZATION;  Surgeon: [name], MD;  Location: [facility];  Service: General;  Laterality: Bilateral;  . CHOLECYSTECTOMY    .  COLONOSCOPY    . DILATION AND CURETTAGE OF UTERUS    . ESOPHAGOGASTRODUODENOSCOPY (EGD) WITH PROPOFOL N/A 07/23/2016   Procedure: ESOPHAGOGASTRODUODENOSCOPY (EGD) WITH PROPOFOL;  Surgeon: Laurence Spates,  MD;  Location: [facility];  Service: Endoscopy;  Laterality: N/A;  . ESOPHAGOGASTRODUODENOSCOPY (EGD) WITH PROPOFOL N/A 06/11/2018   Procedure: ESOPHAGOGASTRODUODENOSCOPY (EGD) WITH PROPOFOL;  Surgeon: Laurence Spates, MD;  Location: [facility];  Service: Endoscopy;  Laterality: N/A;  . LUMBAR LAMINECTOMY  2010  . UMBILICAL HERNIA REPAIR     age 52  . UPPER GI ENDOSCOPY      MEDICATIONS: Current Outpatient Medications on File Prior to Visit  Medication Sig Dispense Refill  . albuterol (PROVENTIL HFA;VENTOLIN HFA) 108 (90 BASE) MCG/ACT inhaler Inhale 2 puffs into the lungs every 6 (six) hours as needed for wheezing or shortness of breath.     Marland Kitchen amLODipine (NORVASC) 5 MG tablet Take 5 mg by mouth daily.    Marland Kitchen atorvastatin (LIPITOR) 10 MG tablet Take 10 mg by mouth at bedtime.    Marland Kitchen azelastine (ASTELIN) 0.1 % nasal spray Place 1 spray into both nostrils 2 (two) times daily as needed for allergies.  2  . Azelastine-Fluticasone 137-50 MCG/ACT SUSP Place 1 spray into the nose at bedtime.    . cyclobenzaprine (FLEXERIL) 10 MG tablet Take 10 mg by mouth 2 (two) times daily as needed for muscle spasms.     . diclofenac (VOLTAREN) 75 MG EC tablet     . diphenhydrAMINE (BENADRYL) 25 MG tablet Take 50 mg by mouth at bedtime.    Marland Kitchen EPINEPHrine 0.3 mg/0.3 mL IJ SOAJ injection Inject 0.3 mg into the muscle once.    . fexofenadine (ALLEGRA) 180 MG tablet Take 180 mg by mouth daily.  5  . fluticasone (FLONASE) 50 MCG/ACT nasal spray INSTILL 1 SPRAY IN EACH NOSTRIL TWICE A DAY    . fluticasone furoate-vilanterol (BREO ELLIPTA) 200-25 MCG/INH AEPB Inhale 1 puff daily into the lungs.    . gabapentin (NEURONTIN) 300 MG capsule Take 300 mg by mouth at bedtime.     Marland Kitchen HYDROcodone-acetaminophen (NORCO/VICODIN) 5-325 MG tablet Take 1 tablet by mouth 2 (two) times daily as needed for moderate pain.   0  . Insulin Pen Needle (BD PEN NEEDLE NANO 2ND GEN) 32G X 4 MM MISC 1 Package by Does not apply route daily. 100 each  0  . LINZESS 72 MCG capsule TAKE ONE CAPSULE BY MOUTH every morning 90 capsule 0  . meclizine (ANTIVERT) 25 MG tablet Take 1 tablet (25 mg total) by mouth every 8 (eight) hours as needed for dizziness. 30 tablet 0  . Melatonin 5 MG CAPS Take 10-15 mg by mouth at bedtime.    . metFORMIN (GLUCOPHAGE-XR) 500 MG 24 hr tablet Take 500 mg by mouth at bedtime.     . metoprolol succinate (TOPROL-XL) 25 MG 24 hr tablet Take 25 mg by mouth every morning.    . ondansetron (ZOFRAN ODT) 4 MG disintegrating tablet Take 1 tablet (4 mg total) by mouth every 6 (six) hours as needed for nausea or vomiting. 20 tablet 0  . oxybutynin (DITROPAN) 5 MG tablet Take 5 mg by mouth 2 (two) times daily as needed for bladder spasms.  0  . oxyCODONE (OXY IR/ROXICODONE) 5 MG immediate release tablet Take 5 mg by mouth 2 (two) times daily as needed for pain.  0  . OZEMPIC, 1 MG/DOSE, 2 MG/1.5ML SOPN Inject 1 mg into the skin once a week. 3 mL 0  .  pantoprazole (PROTONIX) 40 MG tablet Take 40 mg by mouth 2 (two) times daily.  1  . QUEtiapine (SEROQUEL) 300 MG tablet TAKE ONE TABLET BY MOUTH EVERYDAY AT BEDTIME 90 tablet 3  . SUMAtriptan (IMITREX) 100 MG tablet Take 1 tablet (100 mg total) by mouth daily as needed for migraine. May repeat in 2 hours if headache persists or recurs. 10 tablet 0  . torsemide (DEMADEX) 20 MG tablet Take 20 mg by mouth daily as needed (swelling).    . triamcinolone cream (KENALOG) 0.1 % Apply 1 application topically 2 (two) times daily as needed for rash.  0  . Vitamin D, Ergocalciferol, (DRISDOL) 1.25 MG (50000 UNIT) CAPS capsule Take 1 capsule (50,000 Units total) by mouth every 7 (seven) days. 4 capsule 0  . zolpidem (AMBIEN) 10 MG tablet Take 1 tablet (10 mg total) by mouth at bedtime as needed for sleep. 30 tablet 5   No current facility-administered medications on file prior to visit.    ALLERGIES: Allergies  Allergen Reactions  . Rocephin [Ceftriaxone] Anaphylaxis  . Morphine And Related  Nausea And Vomiting    Doesn't work  . Prednisone Itching and Swelling  . Sulfa Antibiotics Itching and Swelling    FAMILY HISTORY: Family History  Problem Relation Age of Onset  . Breast cancer Paternal Aunt   . Breast cancer Paternal Grandmother   . Obesity Mother   . Diabetes Father   . High blood pressure Father   . Sudden death Father     SOCIAL HISTORY: Social History   Socioeconomic History  . Marital status: Single    Spouse name: Not on file  . Number of children: Not on file  . Years of education: Not on file  . Highest education level: Not on file  Occupational History  . Occupation: disabled    Comment: Psychologist, occupational at daycare  Tobacco Use  . Smoking status: Never Smoker  . Smokeless tobacco: Never Used  Substance and Sexual Activity  . Alcohol use: No  . Drug use: No  . Sexual activity: Not on file  Other Topics Concern  . Not on file  Social History Narrative  . Not on file   Social Determinants of Health   Financial Resource Strain: Not on file  Food Insecurity: Not on file  Transportation Needs: Not on file  Physical Activity: Not on file  Stress: Not on file  Social Connections: Not on file  Intimate Partner Violence: Not on file    Objective:  Blood pressure (!) 153/94, pulse (!) 112, height 5\' 1"  (1.549 m), weight 288 lb 9.6 oz (130.9 kg), SpO2 98 %. General: No acute distress.  Patient appears well-groomed.   Head:  Normocephalic/atraumatic Eyes:  fundi examined but not visualized Neck: supple, no paraspinal tenderness, full range of motion Back: No paraspinal tenderness Heart: regular rate and rhythm Lungs: Clear to auscultation bilaterally. Vascular: No carotid bruits. Neurological Exam: Mental status: alert and oriented to person, place, and time, recent and remote memory intact, fund of knowledge intact, attention and concentration intact, speech fluent and not dysarthric, language intact. Cranial nerves: CN I: not tested CN II:  pupils equal, round and reactive to light, visual fields intact CN III, IV, VI:  full range of motion, no nystagmus, no ptosis CN V: facial sensation intact. CN VII: upper and lower face symmetric CN VIII: hearing intact CN IX, X: gag intact, uvula midline CN XI: sternocleidomastoid and trapezius muscles intact CN XII: tongue midline Bulk & Tone:  normal, no fasciculations. Motor:  muscle strength 5/5 throughout Sensation:  Pinprick, temperature and vibratory sensation intact. Deep Tendon Reflexes:  2+ throughout,  toes downgoing.   Finger to nose testing:  Without dysmetria.   Heel to shin:  Without dysmetria.   Gait:  Normal station and stride.  Romberg negative.  Assessment/Plan:   Chronic migraine without aura, with status migrainosus, intractable.  Ideally, I would like to start her on a CGRP inhibitor.  She has previously tried beta blocker, topiramate, tricyclic antidepressant and venlafaxine.  However, she is going to be starting [facility] next month.  I have advised her to contact her new insurance to find out cost of these medications if it should be approved.  If it is not an option, then I would submit prior authorization for Botox.  1.  Due to a year of daily persistent headache, will check MRI of brain with and without contrast. 2.  CGRP inhibitor or Botox (pending insurance response) 3.  As she has a chronic persistent headache, there is no truly effective rescue medication to abort the migraine.  Until the headache again becomes episodic, she will take sumatriptan 100mg  but limit to no more than 2 days out of the week to prevent rebound headache. 4.  Zofran for nausea, meclizine for dizziness 5.  Keep headache diary 6.  Follow up for first round of Botox or in 6 months.    Thank you for allowing me to take part in the care of this patient.  Metta Clines, DO  CC:  Shirline Frees, MD  Briscoe Deutscher, DO

## 2020-08-04 ENCOUNTER — Encounter: Payer: Self-pay | Admitting: Neurology

## 2020-08-04 ENCOUNTER — Ambulatory Visit (INDEPENDENT_AMBULATORY_CARE_PROVIDER_SITE_OTHER): Payer: PPO | Admitting: Neurology

## 2020-08-04 VITALS — BP 153/94 | HR 112 | Ht 61.0 in | Wt 288.6 lb

## 2020-08-04 DIAGNOSIS — G43711 Chronic migraine without aura, intractable, with status migrainosus: Secondary | ICD-10-CM | POA: Diagnosis not present

## 2020-08-04 NOTE — Patient Instructions (Addendum)
°  1. Contact [employer] and see how much copay would be if they approve the following medications: - Aimovig - Emgality - Ajovy Lenoria Chime If these medications are not feasible, then we will submit for Botox 2. Take sumatriptan 100mg  at earliest onset of headache.  May repeat dose once in 2 hours if needed.  Maximum 2 tablets in 24 hours. 3. Limit use of pain relievers to no more than 2 days out of the week.  These medications include acetaminophen, NSAIDs (ibuprofen/Advil/Motrin, naproxen/Aleve, triptans (Imitrex/sumatriptan), Excedrin, and narcotics.  This will help reduce risk of rebound headaches. 4. Be aware of common food triggers:  - Caffeine:  coffee, black tea, cola, Mt. Dew  - Chocolate  - Dairy:  aged cheeses (brie, blue, cheddar, gouda, [place], provolone, [place], Swiss, etc), chocolate milk, buttermilk, sour cream, limit eggs and yogurt  - Nuts, peanut butter  - Alcohol  - Cereals/grains:  FRESH breads (fresh bagels, sourdough, doughnuts), yeast productions  - Processed/canned/aged/cured meats (pre-packaged deli meats, hotdogs)  - MSG/glutamate:  soy sauce, flavor enhancer, pickled/preserved/marinated foods  - Sweeteners:  aspartame (Equal, Nutrasweet).  Sugar and Splenda are okay  - Vegetables:  legumes (lima beans, lentils, snow peas, fava beans, pinto peans, peas, garbanzo beans), sauerkraut, onions, olives, pickles  - Fruit:  avocados, bananas, citrus fruit (orange, lemon, grapefruit), mango  - Other:  Frozen meals, macaroni and cheese 5. Routine exercise 6. Stay adequately hydrated (aim for 64 oz water daily) 7. Keep headache diary 8. Maintain proper stress management 9. Maintain proper sleep hygiene 10. Do not skip meals 11. Consider supplements:  magnesium citrate 400mg  daily, riboflavin 400mg  daily, coenzyme Q10 100mg  three times daily. Will check MRI of brain with and without contrast. We have sent a referral to [facility] for your MRI and they will call you  directly to schedule your appointment. They are located at [place]. If you need to contact them directly please call [phone]. 12.  13.

## 2020-08-09 NOTE — Progress Notes (Signed)
Chief Complaint:   OBESITY Virginia Luna is here to discuss her progress with her obesity treatment plan along with follow-up of her obesity related diagnoses.   Today's visit was #: 16 Starting weight: 288 lbs Starting date: 08/27/2019 Today's weight: 287 lbs Today's date: 08/03/2020 Total lbs lost to date: 1 lb Body mass index is 54.23 kg/m.  Total weight loss percentage to date: -0.35%  Interim History: Virginia Luna will be seeing Virginia Luna and Virginia Luna tomorrow.   Nutrition Plan: practicing portion control and making smarter food choices, such as increasing vegetables and decreasing simple carbohydrates for 0% of the time.  Activity: None at this time.  Assessment/Plan:   1. Nausea Will decrease Virginia Luna to 0.5 mg subcutaneously weekly.   2. Nonintractable headache Will see Virginia Luna tomorrow. We will follow along.   3. Chronic low back pain Virginia Luna will be having another injection. We will follow along. This issue directly impacts care plan for optimization of BMI and metabolic health as it impacts the patient's ability to make lifestyle changes.  4. Type 2 diabetes mellitus with other specified complication, without long-term current use of insulin (Virginia Luna) Diabetes Mellitus: needs improvement. Medication: Virginia Luna. Issues reviewed with her: blood sugar goals, complications of diabetes mellitus, hypoglycemia prevention and treatment, exercise, and nutrition.  Lab Results  Component Value Date   HGBA1C 6.4 (H) 02/08/2020   Lab Results  Component Value Date   LDLCALC 91 02/08/2020   CREATININE 0.70 02/08/2020   - Decrease Virginia Luna,0.25 or 0.5MG /DOS, (Virginia Luna, 0.25 OR 0.5 MG/DOSE,) 2 MG/1.5ML SOPN; Inject 0.5 mg into the skin once a week.  Dispense: 1.5 mL; Refill: 0  5. Other depression, emotional eating Followed by Virginia Luna. We will continue to monitor symptoms as they relate to her weight loss journey.  6. Class 3 severe obesity with serious comorbidity and body  mass index (BMI) of 50.0 to 59.9 in adult, unspecified obesity type Virginia Luna)  Course: Virginia Luna is currently in the action stage of change. As such, her goal is to continue with weight loss efforts.   Nutrition goals: She has agreed to practicing portion control and making smarter food choices, such as increasing vegetables and decreasing simple carbohydrates.   Exercise goals: No exercise has been prescribed at this time.  Behavioral modification strategies: increasing lean protein intake, decreasing simple carbohydrates, increasing vegetables, dealing with family or coworker sabotage, travel eating strategies and holiday eating strategies .  Virginia Luna has agreed to follow-up with our clinic in 3-4 weeks. She was informed of the importance of frequent follow-up visits to maximize her success with intensive lifestyle modifications for her multiple health conditions.   Objective:   Blood pressure 138/85, pulse 97, temperature 98.1 F (36.7 C), temperature source Oral, height 5\' 1"  (1.549 m), weight 287 lb (130.2 kg), SpO2 99 %. Body mass index is 54.23 kg/m.  General: Cooperative, alert, well developed, in no acute distress. HEENT: Conjunctivae and lids unremarkable. Cardiovascular: Regular rhythm.  Lungs: Normal work of breathing. Neurologic: No focal deficits.   Lab Results  Component Value Date   CREATININE 0.70 02/08/2020   BUN 20 02/08/2020   NA 144 02/08/2020   K 4.1 02/08/2020   CL 100 02/08/2020   CO2 25 02/08/2020   Lab Results  Component Value Date   ALT 21 02/08/2020   AST 12 02/08/2020   GGT 75 (H) 02/08/2020   ALKPHOS 140 (H) 02/08/2020   BILITOT 0.2 02/08/2020   Lab Results  Component Value Date  HGBA1C 6.4 (H) 02/08/2020   Lab Results  Component Value Date   INSULIN 18.5 02/08/2020   INSULIN 15.9 08/27/2019   Lab Results  Component Value Date   TSH 1.600 02/08/2020   Lab Results  Component Value Date   CHOL 176 02/08/2020   HDL 64 02/08/2020    LDLCALC 91 02/08/2020   TRIG 121 02/08/2020   Lab Results  Component Value Date   WBC 12.3 (H) 02/08/2020   HGB 11.8 02/08/2020   HCT 37.7 02/08/2020   MCV 85 02/08/2020   PLT 501 (H) 02/08/2020   Lab Results  Component Value Date   IRON 46 02/08/2020   TIBC 378 02/08/2020   FERRITIN 70 02/08/2020   Obesity Behavioral Intervention:   Approximately 15 minutes were spent on the discussion below.  ASK: We discussed the diagnosis of obesity with Virginia Luna today and Virginia Luna agreed to give [name] permission to discuss obesity behavioral modification therapy today.  ASSESS: Virginia Luna has the diagnosis of obesity and her BMI today is 54.3. Virginia Luna is in the action stage of change.   ADVISE: Virginia Luna was educated on the multiple health risks of obesity as well as the benefit of weight loss to improve her health. She was advised of the need for long term treatment and the importance of lifestyle modifications to improve her current health and to decrease her risk of future health problems.  AGREE: Multiple dietary modification options and treatment options were discussed and Virginia Luna agreed to follow the recommendations documented in the above note.  ARRANGE: Virginia Luna was educated on the importance of frequent visits to treat obesity as outlined per CMS and USPSTF guidelines and agreed to schedule her next follow up appointment today.  Attestation Statements:   Reviewed by clinician on day of visit: allergies, medications, problem list, medical history, surgical history, family history, social history, and previous encounter notes.  I, Water quality scientist, CMA, am acting as transcriptionist for Briscoe Deutscher, DO  I have reviewed the above documentation for accuracy and completeness, and I agree with the above. Briscoe Deutscher, DO

## 2020-08-10 DIAGNOSIS — J3089 Other allergic rhinitis: Secondary | ICD-10-CM | POA: Diagnosis not present

## 2020-08-10 DIAGNOSIS — J3081 Allergic rhinitis due to animal (cat) (dog) hair and dander: Secondary | ICD-10-CM | POA: Diagnosis not present

## 2020-08-10 DIAGNOSIS — J301 Allergic rhinitis due to pollen: Secondary | ICD-10-CM | POA: Diagnosis not present

## 2020-08-17 DIAGNOSIS — J3081 Allergic rhinitis due to animal (cat) (dog) hair and dander: Secondary | ICD-10-CM | POA: Diagnosis not present

## 2020-08-17 DIAGNOSIS — I10 Essential (primary) hypertension: Secondary | ICD-10-CM | POA: Diagnosis not present

## 2020-08-17 DIAGNOSIS — J301 Allergic rhinitis due to pollen: Secondary | ICD-10-CM | POA: Diagnosis not present

## 2020-08-17 DIAGNOSIS — J3089 Other allergic rhinitis: Secondary | ICD-10-CM | POA: Diagnosis not present

## 2020-08-24 ENCOUNTER — Other Ambulatory Visit (INDEPENDENT_AMBULATORY_CARE_PROVIDER_SITE_OTHER): Payer: Self-pay | Admitting: Family Medicine

## 2020-08-24 DIAGNOSIS — J3081 Allergic rhinitis due to animal (cat) (dog) hair and dander: Secondary | ICD-10-CM | POA: Diagnosis not present

## 2020-08-24 DIAGNOSIS — G43809 Other migraine, not intractable, without status migrainosus: Secondary | ICD-10-CM

## 2020-08-24 DIAGNOSIS — J3089 Other allergic rhinitis: Secondary | ICD-10-CM | POA: Diagnosis not present

## 2020-08-24 DIAGNOSIS — J301 Allergic rhinitis due to pollen: Secondary | ICD-10-CM | POA: Diagnosis not present

## 2020-08-24 NOTE — Telephone Encounter (Signed)
Please advise about refills 

## 2020-08-31 DIAGNOSIS — J3081 Allergic rhinitis due to animal (cat) (dog) hair and dander: Secondary | ICD-10-CM | POA: Diagnosis not present

## 2020-08-31 DIAGNOSIS — J301 Allergic rhinitis due to pollen: Secondary | ICD-10-CM | POA: Diagnosis not present

## 2020-08-31 DIAGNOSIS — J3089 Other allergic rhinitis: Secondary | ICD-10-CM | POA: Diagnosis not present

## 2020-09-03 ENCOUNTER — Other Ambulatory Visit: Payer: Self-pay

## 2020-09-03 ENCOUNTER — Ambulatory Visit
Admission: RE | Admit: 2020-09-03 | Discharge: 2020-09-03 | Disposition: A | Discharge status: Home / Self Care | Payer: Medicare HMO | Source: Ambulatory Visit | Attending: Neurology | Admitting: Neurology

## 2020-09-03 DIAGNOSIS — G43109 Migraine with aura, not intractable, without status migrainosus: Secondary | ICD-10-CM | POA: Diagnosis not present

## 2020-09-03 DIAGNOSIS — G9389 Other specified disorders of brain: Secondary | ICD-10-CM | POA: Diagnosis not present

## 2020-09-03 DIAGNOSIS — G43711 Chronic migraine without aura, intractable, with status migrainosus: Secondary | ICD-10-CM

## 2020-09-03 MED ORDER — GADOBENATE DIMEGLUMINE 529 MG/ML IV SOLN
20.0000 mL | Freq: Once | INTRAVENOUS | Status: AC | PRN
  Administered 2020-09-03: 20 mL via INTRAVENOUS

## 2020-09-07 DIAGNOSIS — J301 Allergic rhinitis due to pollen: Secondary | ICD-10-CM | POA: Diagnosis not present

## 2020-09-07 DIAGNOSIS — J3089 Other allergic rhinitis: Secondary | ICD-10-CM | POA: Diagnosis not present

## 2020-09-08 ENCOUNTER — Encounter (INDEPENDENT_AMBULATORY_CARE_PROVIDER_SITE_OTHER): Payer: Self-pay | Admitting: Family Medicine

## 2020-09-08 ENCOUNTER — Ambulatory Visit (INDEPENDENT_AMBULATORY_CARE_PROVIDER_SITE_OTHER): Payer: PPO | Admitting: Family Medicine

## 2020-09-08 ENCOUNTER — Telehealth: Payer: Self-pay | Admitting: Neurology

## 2020-09-08 DIAGNOSIS — D329 Benign neoplasm of meninges, unspecified: Secondary | ICD-10-CM

## 2020-09-08 NOTE — Telephone Encounter (Signed)
Patient called for recent MRI results. 

## 2020-09-08 NOTE — Telephone Encounter (Signed)
Looks like patient has seen results on my chart.  It shows a potentially small benign brain mass (nothing worrisome) but we will go ahead and proceed with the recommended imaging with contrast to make sure not missing anything else.  Please order:    Dedicated posterior fossa MRI imaging (IAC protocol) with contrast.  Dx: meningioma

## 2020-09-08 NOTE — Telephone Encounter (Signed)
Advised pt dr.Jaffe out of the office today and tomorrow. Will send note to have someone review and give pt a call back.     Please Review

## 2020-09-09 NOTE — Telephone Encounter (Signed)
Pt advised of her MRI results and recommendation of MRI w Contrast sent to [facility] with not Dedicated posterior fossa imaging (IAC protocol) might be helpful to prove that the mass is in contact with the tentorium cerebelli.  And note pt refers Saturday or Sunday appt, Pt is a hard stick for IV

## 2020-09-12 ENCOUNTER — Telehealth (INDEPENDENT_AMBULATORY_CARE_PROVIDER_SITE_OTHER): Payer: Medicare HMO | Admitting: Family Medicine

## 2020-09-12 DIAGNOSIS — R110 Nausea: Secondary | ICD-10-CM | POA: Diagnosis not present

## 2020-09-12 DIAGNOSIS — E1169 Type 2 diabetes mellitus with other specified complication: Secondary | ICD-10-CM | POA: Diagnosis not present

## 2020-09-12 DIAGNOSIS — G43809 Other migraine, not intractable, without status migrainosus: Secondary | ICD-10-CM | POA: Diagnosis not present

## 2020-09-12 DIAGNOSIS — E559 Vitamin D deficiency, unspecified: Secondary | ICD-10-CM

## 2020-09-12 DIAGNOSIS — Z6841 Body Mass Index (BMI) 40.0 and over, adult: Secondary | ICD-10-CM

## 2020-09-12 NOTE — Telephone Encounter (Signed)
I changed this appt w/pt from in-office to My chart visit.  Please advise pt on the MRI matter.  Thank you

## 2020-09-13 ENCOUNTER — Other Ambulatory Visit: Payer: Self-pay | Admitting: Psychiatry

## 2020-09-13 DIAGNOSIS — F5105 Insomnia due to other mental disorder: Secondary | ICD-10-CM

## 2020-09-14 DIAGNOSIS — J3089 Other allergic rhinitis: Secondary | ICD-10-CM | POA: Diagnosis not present

## 2020-09-14 DIAGNOSIS — J301 Allergic rhinitis due to pollen: Secondary | ICD-10-CM | POA: Diagnosis not present

## 2020-09-14 MED ORDER — ONDANSETRON 4 MG PO TBDP: 4.0000 mg | ORAL_TABLET | Freq: Four times a day (QID) | ORAL | 0 refills | Status: DC | PRN

## 2020-09-14 MED ORDER — VITAMIN D (ERGOCALCIFEROL) 1.25 MG (50000 UNIT) PO CAPS: 50000.0000 [IU] | ORAL_CAPSULE | ORAL | 0 refills | Status: DC

## 2020-09-14 MED ORDER — OZEMPIC (0.25 OR 0.5 MG/DOSE) 2 MG/1.5ML ~~LOC~~ SOPN: 0.5000 mg | PEN_INJECTOR | SUBCUTANEOUS | 0 refills | Status: DC

## 2020-09-14 MED ORDER — SUMATRIPTAN SUCCINATE 100 MG PO TABS: ORAL_TABLET | ORAL | 0 refills | Status: DC

## 2020-09-14 MED ORDER — MECLIZINE HCL 25 MG PO TABS
ORAL_TABLET | ORAL | 0 refills | Status: DC
Start: 2020-09-14 — End: 2020-10-12

## 2020-09-14 NOTE — Progress Notes (Signed)
TeleHealth Visit:  Due to the COVID-19 pandemic, this visit was completed with telemedicine (audio/video) technology to reduce patient and provider exposure as well as to preserve personal protective equipment.   Virginia Luna has verbally consented to this TeleHealth visit. The patient is located at home, the provider is located at the [facility] office. The participants in this visit include the listed provider and patient. The visit was conducted today via MyChart video.  [employer] is here to discuss her progress with her obesity treatment plan along with follow-up of her obesity related diagnoses.   Today's visit was #: 2 Starting weight: 288 lbs Starting date: 08/27/2019 Today's weight: 280 lbs Today's date: 09/12/2020 Total lbs lost to date: 8 lbs Total weight loss percentage to date: -2.78%  Interim History: Virginia Luna's recent Neurology visit was reviewed.  She has an upcoming MRI to evaluate meningioma. Nutrition Plan: practicing portion control and making smarter food choices, such as increasing vegetables and decreasing simple carbohydrates.  She is maintaining. Activity: Increased walking.  Assessment/Plan:   1. Nausea [place] takes Zofran as needed for nausea.  Will refill today, as per below.    - Refill ondansetron (ZOFRAN ODT) 4 MG disintegrating tablet; Take 1 tablet (4 mg total) by mouth every 6 (six) hours as needed for nausea or vomiting.  Dispense: 20 tablet; Refill: 0 - Refill meclizine (ANTIVERT) 25 MG tablet; TAKE ONE TABLET BY MOUTH EVERY 8 HOURS AS NEEDED FOR DIZZINESS  Dispense: 30 tablet; Refill: 0  2. Vitamin D deficiency Improving, but not optimized. Current vitamin D is 31.1, tested on 02/08/2020. Optimal goal > 50 ng/dL.   Plan:  [x]   Continue Vitamin D @50 ,000 IU every week. []   Continue home supplement daily. [x]   Follow-up for routine testing of Vitamin D at least 2-3 times per year to avoid over-replacement.  -Refill Vitamin D,  Ergocalciferol, (DRISDOL) 1.25 MG (50000 UNIT) CAPS capsule; Take 1 capsule (50,000 Units total) by mouth every 7 (seven) days.  Dispense: 4 capsule; Refill: 0  3. Other migraine without status migrainosus, not intractable Virginia Luna is followed by Neurology.  She takes Imitrex for rescue when she gets a migraine.    - Refill SUMAtriptan (IMITREX) 100 MG tablet; TAKE ONE TABLET BY MOUTH DAILY AS NEEDED FOR MIGRAINE. MAY REPEAT IN 2 HOURS IF HEADACHE PERSISTS OR RECURS  Dispense: 9 tablet; Refill: 0  4. Type 2 diabetes mellitus with other specified complication, without long-term current use of insulin (Virginia Luna) Diabetes Mellitus: reasonably well controlled. Medication: Ozempic 0.5 mg subcutaneously weekly. Issues reviewed with her: blood sugar goals, complications of diabetes mellitus, hypoglycemia prevention and treatment, exercise, and nutrition.  Lab Results  Component Value Date   HGBA1C 6.4 (H) 02/08/2020   Lab Results  Component Value Date   LDLCALC 91 02/08/2020   CREATININE 0.70 02/08/2020   - Refill Semaglutide,0.25 or 0.5MG /DOS, (OZEMPIC, 0.25 OR 0.5 MG/DOSE,) 2 MG/1.5ML SOPN; Inject 0.5 mg into the skin once a week.  Dispense: 1.5 mL; Refill: 0  5. Class 3 severe obesity with serious comorbidity and body mass index (BMI) of 50.0 to 59.9 in adult, unspecified obesity type Virginia Luna)  Course: Virginia Luna is currently in the action stage of change. As such, her goal is to continue with weight loss efforts.   Nutrition goals: She has agreed to practicing portion control and making smarter food choices, such as increasing vegetables and decreasing simple carbohydrates.   Exercise goals: As is.  Behavioral modification strategies: increasing lean protein  intake, decreasing simple carbohydrates, increasing vegetables, increasing water intake and decreasing liquid calories.  Virginia Luna has agreed to follow-up with our clinic in 3 weeks. She was informed of the importance of frequent follow-up visits  to maximize her success with intensive lifestyle modifications for her multiple health conditions.   Objective:   General: Cooperative, alert, well developed, in no acute distress. HEENT: Conjunctivae and lids unremarkable. Cardiovascular: Regular rhythm.  Lungs: Normal work of breathing. Neurologic: No focal deficits.   Lab Results  Component Value Date   CREATININE 0.70 02/08/2020   BUN 20 02/08/2020   NA 144 02/08/2020   K 4.1 02/08/2020   CL 100 02/08/2020   CO2 25 02/08/2020   Lab Results  Component Value Date   ALT 21 02/08/2020   AST 12 02/08/2020   GGT 75 (H) 02/08/2020   ALKPHOS 140 (H) 02/08/2020   BILITOT 0.2 02/08/2020   Lab Results  Component Value Date   HGBA1C 6.4 (H) 02/08/2020   Lab Results  Component Value Date   INSULIN 18.5 02/08/2020   INSULIN 15.9 08/27/2019   Lab Results  Component Value Date   TSH 1.600 02/08/2020   Lab Results  Component Value Date   CHOL 176 02/08/2020   HDL 64 02/08/2020   LDLCALC 91 02/08/2020   TRIG 121 02/08/2020   Lab Results  Component Value Date   WBC 12.3 (H) 02/08/2020   HGB 11.8 02/08/2020   HCT 37.7 02/08/2020   MCV 85 02/08/2020   PLT 501 (H) 02/08/2020   Lab Results  Component Value Date   IRON 46 02/08/2020   TIBC 378 02/08/2020   FERRITIN 70 02/08/2020   Attestation Statements:   Reviewed by clinician on day of visit: allergies, medications, problem list, medical history, surgical history, family history, social history, and previous encounter notes.  I, Water quality scientist, CMA, am acting as transcriptionist for Briscoe Deutscher, DO  I have reviewed the above documentation for accuracy and completeness, and I agree with the above. Briscoe Deutscher, DO

## 2020-09-17 ENCOUNTER — Encounter (INDEPENDENT_AMBULATORY_CARE_PROVIDER_SITE_OTHER): Payer: Self-pay | Admitting: Family Medicine

## 2020-09-18 ENCOUNTER — Encounter (INDEPENDENT_AMBULATORY_CARE_PROVIDER_SITE_OTHER): Payer: Self-pay | Admitting: Family Medicine

## 2020-09-19 DIAGNOSIS — I10 Essential (primary) hypertension: Secondary | ICD-10-CM | POA: Diagnosis not present

## 2020-09-19 NOTE — Telephone Encounter (Signed)
Dr Wallace pt °

## 2020-09-21 DIAGNOSIS — J3081 Allergic rhinitis due to animal (cat) (dog) hair and dander: Secondary | ICD-10-CM | POA: Diagnosis not present

## 2020-09-21 DIAGNOSIS — J3089 Other allergic rhinitis: Secondary | ICD-10-CM | POA: Diagnosis not present

## 2020-09-21 DIAGNOSIS — J301 Allergic rhinitis due to pollen: Secondary | ICD-10-CM | POA: Diagnosis not present

## 2020-09-24 ENCOUNTER — Other Ambulatory Visit: Payer: Self-pay

## 2020-09-24 ENCOUNTER — Ambulatory Visit
Admission: RE | Admit: 2020-09-24 | Discharge: 2020-09-24 | Disposition: A | Discharge status: Home / Self Care | Payer: Medicare HMO | Source: Ambulatory Visit | Attending: Neurology | Admitting: Neurology

## 2020-09-24 DIAGNOSIS — R220 Localized swelling, mass and lump, head: Secondary | ICD-10-CM | POA: Diagnosis not present

## 2020-09-24 DIAGNOSIS — D329 Benign neoplasm of meninges, unspecified: Secondary | ICD-10-CM

## 2020-09-24 DIAGNOSIS — G9389 Other specified disorders of brain: Secondary | ICD-10-CM | POA: Diagnosis not present

## 2020-09-24 DIAGNOSIS — G936 Cerebral edema: Secondary | ICD-10-CM | POA: Diagnosis not present

## 2020-09-24 MED ORDER — GADOBENATE DIMEGLUMINE 529 MG/ML IV SOLN
20.0000 mL | Freq: Once | INTRAVENOUS | Status: AC | PRN
  Administered 2020-09-24: 20 mL via INTRAVENOUS

## 2020-09-26 ENCOUNTER — Other Ambulatory Visit (INDEPENDENT_AMBULATORY_CARE_PROVIDER_SITE_OTHER): Payer: Self-pay | Admitting: Family Medicine

## 2020-09-26 ENCOUNTER — Other Ambulatory Visit: Payer: Self-pay

## 2020-09-26 ENCOUNTER — Other Ambulatory Visit: Payer: Self-pay | Admitting: Neurology

## 2020-09-26 ENCOUNTER — Ambulatory Visit: Payer: PPO | Admitting: Neurology

## 2020-09-26 DIAGNOSIS — G43809 Other migraine, not intractable, without status migrainosus: Secondary | ICD-10-CM

## 2020-09-26 DIAGNOSIS — R110 Nausea: Secondary | ICD-10-CM

## 2020-09-26 DIAGNOSIS — D496 Neoplasm of unspecified behavior of brain: Secondary | ICD-10-CM

## 2020-09-26 DIAGNOSIS — K5909 Other constipation: Secondary | ICD-10-CM

## 2020-09-26 DIAGNOSIS — E559 Vitamin D deficiency, unspecified: Secondary | ICD-10-CM

## 2020-09-26 MED ORDER — EMGALITY 120 MG/ML ~~LOC~~ SOAJ: 120.0000 mg | Freq: Once | SUBCUTANEOUS | 0 refills | Status: AC

## 2020-09-26 MED ORDER — EMGALITY 120 MG/ML ~~LOC~~ SOAJ: 120.0000 mg | SUBCUTANEOUS | 5 refills | Status: DC

## 2020-09-26 NOTE — Progress Notes (Signed)
Referral added per [facility]

## 2020-09-26 NOTE — Telephone Encounter (Signed)
Dr.Wallace °

## 2020-09-27 ENCOUNTER — Other Ambulatory Visit (INDEPENDENT_AMBULATORY_CARE_PROVIDER_SITE_OTHER): Payer: Self-pay | Admitting: Family Medicine

## 2020-09-27 DIAGNOSIS — G43809 Other migraine, not intractable, without status migrainosus: Secondary | ICD-10-CM

## 2020-09-27 DIAGNOSIS — J4520 Mild intermittent asthma, uncomplicated: Secondary | ICD-10-CM | POA: Diagnosis not present

## 2020-09-27 DIAGNOSIS — E119 Type 2 diabetes mellitus without complications: Secondary | ICD-10-CM | POA: Diagnosis not present

## 2020-09-27 DIAGNOSIS — E7800 Pure hypercholesterolemia, unspecified: Secondary | ICD-10-CM | POA: Diagnosis not present

## 2020-09-27 DIAGNOSIS — K219 Gastro-esophageal reflux disease without esophagitis: Secondary | ICD-10-CM | POA: Diagnosis not present

## 2020-09-27 DIAGNOSIS — R110 Nausea: Secondary | ICD-10-CM

## 2020-09-27 DIAGNOSIS — I10 Essential (primary) hypertension: Secondary | ICD-10-CM | POA: Diagnosis not present

## 2020-09-27 DIAGNOSIS — D649 Anemia, unspecified: Secondary | ICD-10-CM | POA: Diagnosis not present

## 2020-09-28 ENCOUNTER — Other Ambulatory Visit (INDEPENDENT_AMBULATORY_CARE_PROVIDER_SITE_OTHER): Payer: Self-pay | Admitting: Family Medicine

## 2020-09-28 DIAGNOSIS — R110 Nausea: Secondary | ICD-10-CM

## 2020-09-28 DIAGNOSIS — G43809 Other migraine, not intractable, without status migrainosus: Secondary | ICD-10-CM

## 2020-09-28 DIAGNOSIS — J3089 Other allergic rhinitis: Secondary | ICD-10-CM | POA: Diagnosis not present

## 2020-09-28 DIAGNOSIS — J301 Allergic rhinitis due to pollen: Secondary | ICD-10-CM | POA: Diagnosis not present

## 2020-09-28 DIAGNOSIS — J3081 Allergic rhinitis due to animal (cat) (dog) hair and dander: Secondary | ICD-10-CM | POA: Diagnosis not present

## 2020-09-28 NOTE — Telephone Encounter (Signed)
Dr.Wallace °

## 2020-09-29 ENCOUNTER — Other Ambulatory Visit (INDEPENDENT_AMBULATORY_CARE_PROVIDER_SITE_OTHER): Payer: Self-pay | Admitting: Family Medicine

## 2020-09-29 ENCOUNTER — Telehealth: Payer: Self-pay | Admitting: Neurology

## 2020-09-29 ENCOUNTER — Other Ambulatory Visit: Payer: Self-pay | Admitting: Family Medicine

## 2020-09-29 DIAGNOSIS — Z1231 Encounter for screening mammogram for malignant neoplasm of breast: Secondary | ICD-10-CM

## 2020-09-29 DIAGNOSIS — G43809 Other migraine, not intractable, without status migrainosus: Secondary | ICD-10-CM

## 2020-09-29 MED ORDER — SUMATRIPTAN SUCCINATE 100 MG PO TABS
ORAL_TABLET | ORAL | 0 refills | Status: DC
Start: 2020-09-29 — End: 2020-10-12

## 2020-09-29 MED ORDER — LINACLOTIDE 72 MCG PO CAPS: 72.0000 ug | ORAL_CAPSULE | Freq: Every morning | ORAL | 0 refills | Status: DC

## 2020-09-29 NOTE — Telephone Encounter (Signed)
Pt advised Emgality sent to the [facility]. Pt to call back if she has any issues.

## 2020-10-05 DIAGNOSIS — J3089 Other allergic rhinitis: Secondary | ICD-10-CM | POA: Diagnosis not present

## 2020-10-05 DIAGNOSIS — J3081 Allergic rhinitis due to animal (cat) (dog) hair and dander: Secondary | ICD-10-CM | POA: Diagnosis not present

## 2020-10-05 DIAGNOSIS — J301 Allergic rhinitis due to pollen: Secondary | ICD-10-CM | POA: Diagnosis not present

## 2020-10-06 ENCOUNTER — Encounter: Payer: Self-pay | Admitting: Obstetrics and Gynecology

## 2020-10-06 ENCOUNTER — Ambulatory Visit: Payer: Medicare HMO | Admitting: Obstetrics and Gynecology

## 2020-10-06 ENCOUNTER — Other Ambulatory Visit (HOSPITAL_COMMUNITY)
Admission: RE | Admit: 2020-10-06 | Discharge: 2020-10-06 | Disposition: A | Discharge status: Home / Self Care | Payer: Medicare HMO | Source: Ambulatory Visit | Attending: Obstetrics and Gynecology | Admitting: Obstetrics and Gynecology

## 2020-10-06 ENCOUNTER — Other Ambulatory Visit: Payer: Self-pay

## 2020-10-06 ENCOUNTER — Other Ambulatory Visit: Payer: Self-pay | Admitting: Neurological Surgery

## 2020-10-06 VITALS — BP 110/64 | HR 110 | Ht 62.0 in | Wt 288.0 lb

## 2020-10-06 DIAGNOSIS — J309 Allergic rhinitis, unspecified: Secondary | ICD-10-CM | POA: Insufficient documentation

## 2020-10-06 DIAGNOSIS — G9389 Other specified disorders of brain: Secondary | ICD-10-CM | POA: Insufficient documentation

## 2020-10-06 DIAGNOSIS — Z1151 Encounter for screening for human papillomavirus (HPV): Secondary | ICD-10-CM | POA: Diagnosis not present

## 2020-10-06 DIAGNOSIS — N3942 Incontinence without sensory awareness: Secondary | ICD-10-CM | POA: Diagnosis not present

## 2020-10-06 DIAGNOSIS — Z01419 Encounter for gynecological examination (general) (routine) without abnormal findings: Secondary | ICD-10-CM | POA: Diagnosis not present

## 2020-10-06 DIAGNOSIS — Z124 Encounter for screening for malignant neoplasm of cervix: Secondary | ICD-10-CM

## 2020-10-06 DIAGNOSIS — J4540 Moderate persistent asthma, uncomplicated: Secondary | ICD-10-CM | POA: Insufficient documentation

## 2020-10-06 DIAGNOSIS — D259 Leiomyoma of uterus, unspecified: Secondary | ICD-10-CM | POA: Diagnosis not present

## 2020-10-06 DIAGNOSIS — N319 Neuromuscular dysfunction of bladder, unspecified: Secondary | ICD-10-CM

## 2020-10-06 DIAGNOSIS — J3081 Allergic rhinitis due to animal (cat) (dog) hair and dander: Secondary | ICD-10-CM | POA: Insufficient documentation

## 2020-10-06 DIAGNOSIS — J301 Allergic rhinitis due to pollen: Secondary | ICD-10-CM | POA: Insufficient documentation

## 2020-10-06 NOTE — Progress Notes (Signed)
60 y.o. G0P0000 Single Black or African American Not Hispanic or Latino female here for annual exam and to establish care. She states that she has a bump on her labia that has been there for years. The lump comes and goes, can be irritating. Occasional drains.  No vaginal bleeding. Not sexually active.   She has some decreased sensation to void. She will go a long time between voiding, double voids. Doesn't think she is emptying. She has done urodynamics a long time ago.  She has random leakage. Wears a pad daily, changes it daily (2 pads a day).   IBS, constipation, has linzess.   She was just diagnosed with a brain tumor, imaging done for worsening migraines. She has an appointment with the Neurosurgeon today.     No LMP recorded (lmp unknown). Patient is postmenopausal.          Sexually active: No.  The current method of family planning is none and post menopausal status.    Exercising: Yes.    stretching  Smoker:  no  Health Maintenance: Pap:  2020 Normal  History of abnormal Pap:  no MMG:  05/18/19 Bi-rads 1 neg, scheduled.   BMD:   none Colonoscopy: 2019 f/u 10  TDaP:  Unsure  Gardasil: na   reports that she has never smoked. She has never used smokeless tobacco. She reports that she does not drink alcohol and does not use drugs. Disabled from fibromyalgia. Used to work in [employer].   Past Medical History:  Diagnosis Date  . Allergies   . Anemia   . Arthritis   . Asthma   . Back pain   . Chronic pain   . Diabetes ([place])   . Diabetes mellitus without complication ([place])   . Edema, lower extremity   . Fibroid   . Fibromyalgia   . High blood pressure   . History of stomach ulcers   . IBS (irritable bowel syndrome)   . Joint pain   . Sleep apnea    did use cpap-lost 25lb-says she does not need it  . Wears contact lenses     Past Surgical History:  Procedure Laterality Date  . BREAST BIOPSY    . BREAST EXCISIONAL BIOPSY    . BREAST LUMPECTOMY WITH  RADIOACTIVE SEED LOCALIZATION Bilateral 11/24/2014   Procedure: BILATERAL BREAST LUMPECTOMY WITH RADIOACTIVE SEED LOCALIZATION;  Surgeon: [name], MD;  Location: [facility];  Service: General;  Laterality: Bilateral;  . CHOLECYSTECTOMY    . COLONOSCOPY    . DILATION AND CURETTAGE OF UTERUS    . ESOPHAGOGASTRODUODENOSCOPY (EGD) WITH PROPOFOL N/A 07/23/2016   Procedure: ESOPHAGOGASTRODUODENOSCOPY (EGD) WITH PROPOFOL;  Surgeon: Laurence Spates, MD;  Location: [facility];  Service: Endoscopy;  Laterality: N/A;  . ESOPHAGOGASTRODUODENOSCOPY (EGD) WITH PROPOFOL N/A 06/11/2018   Procedure: ESOPHAGOGASTRODUODENOSCOPY (EGD) WITH PROPOFOL;  Surgeon: Laurence Spates, MD;  Location: [facility];  Service: Endoscopy;  Laterality: N/A;  . LUMBAR LAMINECTOMY  2010  . UMBILICAL HERNIA REPAIR     age 35  . UPPER GI ENDOSCOPY      Current Outpatient Medications  Medication Sig Dispense Refill  . albuterol (PROVENTIL HFA;VENTOLIN HFA) 108 (90 BASE) MCG/ACT inhaler Inhale 2 puffs into the lungs every 6 (six) hours as needed for wheezing or shortness of breath.     Marland Kitchen amLODipine (NORVASC) 5 MG tablet Take 5 mg by mouth daily.    Marland Kitchen atorvastatin (LIPITOR) 10 MG tablet Take 10 mg by mouth at bedtime.    Marland Kitchen  azelastine (ASTELIN) 0.1 % nasal spray Place 1 spray into both nostrils 2 (two) times daily as needed for allergies.  2  . Azelastine-Fluticasone 137-50 MCG/ACT SUSP Place 1 spray into the nose at bedtime.    . cyclobenzaprine (FLEXERIL) 10 MG tablet Take 10 mg by mouth 2 (two) times daily as needed for muscle spasms.     . diclofenac (VOLTAREN) 75 MG EC tablet     . diphenhydrAMINE (BENADRYL) 25 MG tablet Take 50 mg by mouth at bedtime.    Marland Kitchen EPINEPHrine 0.3 mg/0.3 mL IJ SOAJ injection Inject 0.3 mg into the muscle once.    . fexofenadine (ALLEGRA) 180 MG tablet Take 180 mg by mouth daily.  5  . fluticasone (FLONASE) 50 MCG/ACT nasal spray INSTILL 1 SPRAY IN EACH NOSTRIL TWICE A DAY    .  fluticasone furoate-vilanterol (BREO ELLIPTA) 200-25 MCG/INH AEPB Inhale 1 puff daily into the lungs.    . gabapentin (NEURONTIN) 300 MG capsule Take 300 mg by mouth at bedtime.     . Galcanezumab-gnlm (EMGALITY) 120 MG/ML SOAJ Inject 120 mg into the skin every 28 (twenty-eight) days. 1.12 mL 5  . HYDROcodone-acetaminophen (NORCO/VICODIN) 5-325 MG tablet Take 1 tablet by mouth 2 (two) times daily as needed for moderate pain.   0  . Insulin Pen Needle (BD PEN NEEDLE NANO 2ND GEN) 32G X 4 MM MISC 1 Package by Does not apply route daily. 100 each 0  . linaclotide (LINZESS) 72 MCG capsule Take 1 capsule (72 mcg total) by mouth every morning. 90 capsule 0  . meclizine (ANTIVERT) 25 MG tablet TAKE ONE TABLET BY MOUTH EVERY 8 HOURS AS NEEDED FOR DIZZINESS 30 tablet 0  . Melatonin 5 MG CAPS Take 10-15 mg by mouth at bedtime.    . metFORMIN (GLUCOPHAGE-XR) 500 MG 24 hr tablet Take 500 mg by mouth at bedtime.     . metoprolol succinate (TOPROL-XL) 25 MG 24 hr tablet Take 25 mg by mouth every morning.    . ondansetron (ZOFRAN ODT) 4 MG disintegrating tablet Take 1 tablet (4 mg total) by mouth every 6 (six) hours as needed for nausea or vomiting. 20 tablet 0  . oxybutynin (DITROPAN) 5 MG tablet Take 5 mg by mouth 2 (two) times daily as needed for bladder spasms.  0  . oxyCODONE (OXY IR/ROXICODONE) 5 MG immediate release tablet Take 5 mg by mouth 2 (two) times daily as needed for pain.  0  . pantoprazole (PROTONIX) 40 MG tablet Take 40 mg by mouth 2 (two) times daily.  1  . QUEtiapine (SEROQUEL) 300 MG tablet TAKE ONE TABLET BY MOUTH EVERYDAY AT BEDTIME 90 tablet 3  . Semaglutide,0.25 or 0.5MG /DOS, (OZEMPIC, 0.25 OR 0.5 MG/DOSE,) 2 MG/1.5ML SOPN Inject 0.5 mg into the skin once a week. 1.5 mL 0  . SUMAtriptan (IMITREX) 100 MG tablet TAKE ONE TABLET BY MOUTH DAILY AS NEEDED FOR MIGRAINE. MAY REPEAT IN 2 HOURS IF HEADACHE PERSISTS OR RECURS 9 tablet 0  . torsemide (DEMADEX) 20 MG tablet Take 20 mg by mouth daily  as needed (swelling).    . triamcinolone cream (KENALOG) 0.1 % Apply 1 application topically 2 (two) times daily as needed for rash.  0  . Vitamin D, Ergocalciferol, (DRISDOL) 1.25 MG (50000 UNIT) CAPS capsule Take 1 capsule (50,000 Units total) by mouth every 7 (seven) days. 4 capsule 0  . zolpidem (AMBIEN) 10 MG tablet TAKE ONE TABLET BY MOUTH EVERYDAY AT BEDTIME 30 tablet 4   No  current facility-administered medications for this visit.    Family History  Problem Relation Age of Onset  . Breast cancer Paternal Aunt   . Breast cancer Paternal Grandmother   . Obesity Mother   . Diabetes Father   . High blood pressure Father   . Sudden death Father     Review of Systems  All other systems reviewed and are negative.   Exam:   BP 110/64   Pulse (!) 110   Ht 5\' 2"  (1.575 m)   Wt 288 lb (130.6 kg)   LMP  (LMP Unknown)   SpO2 98%   BMI 52.68 kg/m   Weight change: @WEIGHTCHANGE @ Height:   Height: 5\' 2"  (157.5 cm)  Ht Readings from Last 3 Encounters:  10/06/20 5\' 2"  (1.575 m)  08/04/20 5\' 1"  (1.549 m)  08/03/20 5\' 1"  (1.549 m)    General appearance: alert, cooperative and appears stated age Head: Normocephalic, without obvious abnormality, atraumatic Neck: no adenopathy, supple, symmetrical, trachea midline and thyroid normal to inspection and palpation Lungs: clear to auscultation bilaterally Cardiovascular: regular rate and rhythm Breasts: normal appearance, no masses or tenderness Abdomen: soft, non-tender; non distended,  no masses,  no organomegaly Extremities: extremities normal, atraumatic, no cyanosis or edema Skin: Skin color, texture, turgor normal. No rashes or lesions Lymph nodes: Cervical, supraclavicular, and axillary nodes normal. No abnormal inguinal nodes palpated Neurologic: Grossly normal   Pelvic: External genitalia:  On the left labia majora is a 1 cm vulvar cyst filled with sebaceous material. Not tender, no surrounding erythema.                Urethra:  normal appearing urethra with no masses, tenderness or lesions              Bartholins and Skenes: normal                 Vagina: normal appearing vagina with normal color and discharge, no lesions              Cervix: no lesions               Bimanual Exam:  Uterus:  enlarged, irregular, non tender uterus, exam somewhat limited by bmi, suspect 10-12 week sized.              Adnexa: no mass, fullness, tenderness               Rectovaginal: Confirms               Anus:  normal sphincter tone, no lesions  Gae Dry chaperoned for the exam.  1. Well woman exam Discussed breast self exam Discussed calcium and vit D intake Mammogram scheduled Colonoscopy up to date.   2. Screening for cervical cancer  - Cytology - PAP with hpv  3. Bladder dysfunction  - Ambulatory referral to Urology  4. Urinary incontinence without sensory awareness  - Ambulatory referral to Urology  5. Uterine leiomyoma, unspecified location Will get a copy of records from her prior GYN to make sure her uterus isn't any larger.

## 2020-10-06 NOTE — Patient Instructions (Signed)

## 2020-10-10 LAB — CYTOLOGY - PAP
Comment: NEGATIVE
Diagnosis: NEGATIVE
Diagnosis: REACTIVE
High risk HPV: NEGATIVE

## 2020-10-12 ENCOUNTER — Ambulatory Visit (INDEPENDENT_AMBULATORY_CARE_PROVIDER_SITE_OTHER): Payer: Medicare HMO | Admitting: Family Medicine

## 2020-10-12 ENCOUNTER — Encounter (INDEPENDENT_AMBULATORY_CARE_PROVIDER_SITE_OTHER): Payer: Self-pay | Admitting: Family Medicine

## 2020-10-12 ENCOUNTER — Other Ambulatory Visit: Payer: Self-pay

## 2020-10-12 VITALS — BP 142/90 | HR 90 | Temp 98.5°F | Ht 61.0 in | Wt 285.0 lb

## 2020-10-12 DIAGNOSIS — K5909 Other constipation: Secondary | ICD-10-CM

## 2020-10-12 DIAGNOSIS — G43809 Other migraine, not intractable, without status migrainosus: Secondary | ICD-10-CM

## 2020-10-12 DIAGNOSIS — J301 Allergic rhinitis due to pollen: Secondary | ICD-10-CM | POA: Diagnosis not present

## 2020-10-12 DIAGNOSIS — Z79899 Other long term (current) drug therapy: Secondary | ICD-10-CM

## 2020-10-12 DIAGNOSIS — Z6841 Body Mass Index (BMI) 40.0 and over, adult: Secondary | ICD-10-CM | POA: Diagnosis not present

## 2020-10-12 DIAGNOSIS — E538 Deficiency of other specified B group vitamins: Secondary | ICD-10-CM | POA: Diagnosis not present

## 2020-10-12 DIAGNOSIS — G9389 Other specified disorders of brain: Secondary | ICD-10-CM

## 2020-10-12 DIAGNOSIS — E1169 Type 2 diabetes mellitus with other specified complication: Secondary | ICD-10-CM

## 2020-10-12 DIAGNOSIS — E559 Vitamin D deficiency, unspecified: Secondary | ICD-10-CM

## 2020-10-12 DIAGNOSIS — R110 Nausea: Secondary | ICD-10-CM | POA: Diagnosis not present

## 2020-10-12 DIAGNOSIS — J3089 Other allergic rhinitis: Secondary | ICD-10-CM | POA: Diagnosis not present

## 2020-10-12 DIAGNOSIS — F5105 Insomnia due to other mental disorder: Secondary | ICD-10-CM | POA: Diagnosis not present

## 2020-10-12 DIAGNOSIS — J3081 Allergic rhinitis due to animal (cat) (dog) hair and dander: Secondary | ICD-10-CM | POA: Diagnosis not present

## 2020-10-12 MED ORDER — MECLIZINE HCL 25 MG PO TABS
ORAL_TABLET | ORAL | 0 refills | Status: DC
Start: 2020-10-12 — End: 2020-11-21

## 2020-10-12 MED ORDER — LINACLOTIDE 72 MCG PO CAPS: 72.0000 ug | ORAL_CAPSULE | Freq: Every morning | ORAL | 0 refills | Status: DC

## 2020-10-12 MED ORDER — SUMATRIPTAN SUCCINATE 100 MG PO TABS: ORAL_TABLET | ORAL | 0 refills | Status: DC

## 2020-10-12 MED ORDER — VITAMIN D (ERGOCALCIFEROL) 1.25 MG (50000 UNIT) PO CAPS: 50000.0000 [IU] | ORAL_CAPSULE | ORAL | 0 refills | Status: DC

## 2020-10-12 MED ORDER — OZEMPIC (0.25 OR 0.5 MG/DOSE) 2 MG/1.5ML ~~LOC~~ SOPN: 0.5000 mg | PEN_INJECTOR | SUBCUTANEOUS | 0 refills | Status: DC

## 2020-10-13 ENCOUNTER — Encounter (INDEPENDENT_AMBULATORY_CARE_PROVIDER_SITE_OTHER): Payer: Self-pay | Admitting: Family Medicine

## 2020-10-13 LAB — INSULIN, RANDOM: INSULIN: 10.9 u[IU]/mL (ref 2.6–24.9)

## 2020-10-13 LAB — CBC WITH DIFFERENTIAL/PLATELET
Basophils Absolute: 0 10*3/uL (ref 0.0–0.2)
Basos: 0 %
EOS (ABSOLUTE): 0.3 10*3/uL (ref 0.0–0.4)
Eos: 2 %
Hematocrit: 36.1 % (ref 34.0–46.6)
Hemoglobin: 11.4 g/dL (ref 11.1–15.9)
Immature Grans (Abs): 0.1 10*3/uL (ref 0.0–0.1)
Immature Granulocytes: 1 %
Lymphocytes Absolute: 2 10*3/uL (ref 0.7–3.1)
Lymphs: 17 %
MCH: 27 pg (ref 26.6–33.0)
MCHC: 31.6 g/dL (ref 31.5–35.7)
MCV: 85 fL (ref 79–97)
Monocytes Absolute: 0.5 10*3/uL (ref 0.1–0.9)
Monocytes: 5 %
Neutrophils Absolute: 8.5 10*3/uL — ABNORMAL HIGH (ref 1.4–7.0)
Neutrophils: 75 %
Platelets: 457 10*3/uL — ABNORMAL HIGH (ref 150–450)
RBC: 4.23 x10E6/uL (ref 3.77–5.28)
RDW: 15.2 % (ref 11.7–15.4)
WBC: 11.3 10*3/uL — ABNORMAL HIGH (ref 3.4–10.8)

## 2020-10-13 LAB — COMPREHENSIVE METABOLIC PANEL
ALT: 9 IU/L (ref 0–32)
AST: 9 IU/L (ref 0–40)
Albumin/Globulin Ratio: 1.7 (ref 1.2–2.2)
Albumin: 4.7 g/dL (ref 3.8–4.9)
Alkaline Phosphatase: 139 IU/L — ABNORMAL HIGH (ref 44–121)
BUN/Creatinine Ratio: 25 — ABNORMAL HIGH (ref 9–23)
BUN: 15 mg/dL (ref 6–24)
Bilirubin Total: 0.3 mg/dL (ref 0.0–1.2)
CO2: 22 mmol/L (ref 20–29)
Calcium: 9.6 mg/dL (ref 8.7–10.2)
Chloride: 103 mmol/L (ref 96–106)
Creatinine, Ser: 0.61 mg/dL (ref 0.57–1.00)
GFR calc Af Amer: 116 mL/min/{1.73_m2} (ref >59)
GFR calc non Af Amer: 100 mL/min/{1.73_m2} (ref >59)
Globulin, Total: 2.8 g/dL (ref 1.5–4.5)
Glucose: 99 mg/dL (ref 65–99)
Potassium: 4.3 mmol/L (ref 3.5–5.2)
Sodium: 143 mmol/L (ref 134–144)
Total Protein: 7.5 g/dL (ref 6.0–8.5)

## 2020-10-13 LAB — HEMOGLOBIN A1C
Est. average glucose Bld gHb Est-mCnc: 126 mg/dL
Hgb A1c MFr Bld: 6 % — ABNORMAL HIGH (ref 4.8–5.6)

## 2020-10-13 LAB — IRON,TIBC AND FERRITIN PANEL
Ferritin: 45 ng/mL (ref 15–150)
Iron Saturation: 10 % — ABNORMAL LOW (ref 15–55)
Iron: 38 ug/dL (ref 27–159)
Total Iron Binding Capacity: 371 ug/dL (ref 250–450)
UIBC: 333 ug/dL (ref 131–425)

## 2020-10-13 LAB — SEDIMENTATION RATE: Sed Rate: 16 mm/hr (ref 0–40)

## 2020-10-13 LAB — VITAMIN D 25 HYDROXY (VIT D DEFICIENCY, FRACTURES): Vit D, 25-Hydroxy: 38.1 ng/mL (ref 30.0–100.0)

## 2020-10-13 LAB — VITAMIN B12: Vitamin B-12: 548 pg/mL (ref 232–1245)

## 2020-10-13 LAB — TSH: TSH: 1.04 u[IU]/mL (ref 0.450–4.500)

## 2020-10-13 LAB — T4, FREE: Free T4: 1.3 ng/dL (ref 0.82–1.77)

## 2020-10-13 NOTE — Progress Notes (Signed)
Chief Complaint:   OBESITY Virginia Luna is here to discuss her progress with her obesity treatment plan along with follow-up of her obesity related diagnoses.   Today's visit was #: 18 Starting weight: 288 lbs Starting date: 08/27/2019 Today's weight: 285 lbs Today's date: 10/12/2020 Total lbs lost to date: 3 lbs Body mass index is 53.85 kg/m.  Total weight loss percentage to date: -1.04%  Interim History: Virginia Luna had a second MRI on 09/24/20 with noted brain mass.  IMPRESSION: 1. 8.3 x 10.2 x 8.8 mm enhancing mass in the superior posterior fossa to the left of midline with edema within the superior cerebellum. This is unchanged since the study of 3 weeks ago. The differential diagnosis is that of an unusual manifestation of meningioma versus intra-axial brain mass. Numerous features of this are atypical for meningioma, but that diagnosis does remain possible. We have not excluded in intra-axial brain mass at this point. We could do some additional high-resolution imaging, but I think there might still be question as to the diagnosis. Consider short-term follow-up in 2-3 months. 2. No second lesion seen elsewhere. Mild chronic small-vessel ischemic change of the hemispheric white matter.  Evaluation with Neurosurgery last week. Was told repeat MRI in one month. She continues to have symptoms of headache, vertigo, and paresthesias.   Cancer screenings are generally up to date: Colonoscopy within the last 10 years, normal. Mammogram 05/18/19, normal. Endoscopy 2019, normal. PAP 2002, normal. FamHx cancer includes grandmother with stomach cancer at old age. She has never been a smoker.   She is still followed by [facility] and feels supported.   Current Meal Plan: practicing portion control and making smarter food choices, such as increasing vegetables and decreasing simple carbohydrates for 100% of the time.  Current Exercise Plan: None. Current Anti-Obesity Medications: Ozempic 0.5 mg  subcutaneously weekly. Side effects: None.  Assessment/Plan:   1. Insomnia due to mental condition This is well controlled.  Current treatment: Ambien 10 mg at bedtime as needed for sleep.  Plan: Recommend sleep hygiene measures including regular sleep schedule, optimal sleep environment, and relaxing presleep rituals.   2. Vitamin D deficiency Not at goal. Current vitamin D is 31.1, tested on 02/08/2020. Optimal goal > 50 ng/dL.   Plan: Continue to take prescription Vitamin D $RemoveB'@50'KHSkkPOK$ ,000 IU every week as prescribed.  Will check vitamin D level today.  - Refill Vitamin D, Ergocalciferol, (DRISDOL) 1.25 MG (50000 UNIT) CAPS capsule; Take 1 capsule (50,000 Units total) by mouth every 7 (seven) days.  Dispense: 4 capsule; Refill: 0  - VITAMIN D 25 Hydroxy (Vit-D Deficiency, Fractures)  3. Chronic constipation Virginia Luna was informed that a decrease in bowel movement frequency is normal while losing weight, but stools should not be hard or painful.    Counseling: Getting to Good Bowel Health: Your goal is to have one soft bowel movement each day. Drink at least 8 glasses of water each day. Eat plenty of fiber (goal is over 30 grams each day). It is best to get most of your fiber from dietary sources which includes leafy green vegetables, fresh fruit, and whole grains. You may need to add fiber with the help of OTC fiber supplements. These include Metamucil, Citrucel, and Benefiber. If you are still having trouble, try adding an osmotic laxative such as Miralax. If all of these changes do not work, Virginia Luna.   - Refill linaclotide (LINZESS) 72 MCG capsule; Take 1 capsule (72 mcg total) by mouth every morning.  Dispense: 90 capsule; Refill: 0  4. Nausea Chronic, being followed by PCP and Neurology. Will refill her meclizine today, as per below.  - Refill meclizine (ANTIVERT) 25 MG tablet; TAKE ONE TABLET BY MOUTH EVERY 8 HOURS AS NEEDED FOR DIZZINESS  Dispense: 30 tablet; Refill: 0  5.  Type 2 diabetes mellitus with other specified complication, without long-term current use of insulin (HCC) Diabetes Mellitus: Not at goal. Medication: metformin 500 mg daily, Ozempic 0.5 mg subcutaneously weekly. Issues reviewed: blood sugar goals, complications of diabetes mellitus, hypoglycemia prevention and treatment, exercise, and nutrition.   Plan: The importance of regular follow up with PCP and all other specialists as scheduled was stressed to patient today.  Will check labs today.  Lab Results  Component Value Date   HGBA1C 6.4 (H) 02/08/2020   Lab Results  Component Value Date   LDLCALC 91 02/08/2020   - Refill Semaglutide,0.25 or 0.5MG /DOS, (OZEMPIC, 0.25 OR 0.5 MG/DOSE,) 2 MG/1.5ML SOPN; Inject 0.5 mg into the skin once a week.  Dispense: 1.5 mL; Refill: 0 - CBC with Differential/Platelet - Comprehensive metabolic panel - Hemoglobin A1c - Insulin, random - Sed Rate (ESR)  6. Other migraine without status migrainosus, not intractable Virginia Luna is followed by Neurology.  She takes Imitrex for rescue when she gets a migraine.  Will refill her Imitrex today.  - Refill SUMAtriptan (IMITREX) 100 MG tablet; TAKE ONE TABLET BY MOUTH DAILY AS NEEDED FOR MIGRAINE. MAY REPEAT IN 2 HOURS IF HEADACHE PERSISTS OR RECURS  Dispense: 9 tablet; Refill: 0  7. Medication management Will check labs today.  8. B12 deficiency Will check vitamin B12, iron, TIBC, and ferritin level today, as per below.   - Vitamin B12 - Iron, TIBC and Ferritin Panel  9. Brain mass Virginia Luna was recently found to have a brain mass on MRI.  She will follow-up with Neurology in 1 month for repeat surveillance.  I communicated with Dr. Tomi Likens regarding treatment durign the next month. Will ask NeuroOnc to review image as well.   10. Class 3 severe obesity with serious comorbidity and body mass index (BMI) of 50.0 to 59.9 in adult, unspecified obesity type (Virginia Luna)  Course: Virginia Luna is currently in the action stage of  change. As such, her goal is to continue with weight loss efforts.   Nutrition goals: She has agreed to practicing portion control and making smarter food choices, such as increasing vegetables and decreasing simple carbohydrates.   Exercise goals: No exercise has been prescribed at this time.  Behavioral modification strategies: increasing lean protein intake, decreasing simple carbohydrates, increasing vegetables, increasing water intake and emotional eating strategies.  [facility] has agreed to follow-up with our clinic in 3 weeks. She was informed of the importance of frequent follow-up visits to maximize her success with intensive lifestyle modifications for her multiple health conditions.   Objective:   Blood pressure (!) 142/90, pulse 90, temperature 98.5 F (36.9 C), temperature source Oral, height 5\' 1"  (1.549 m), weight 285 lb (129.3 kg), SpO2 96 %. Body mass index is 53.85 kg/m.  General: Cooperative, alert, well developed, in no acute distress. HEENT: Conjunctivae and lids unremarkable. Cardiovascular: Regular rhythm.  Lungs: Normal work of breathing. Neurologic: No focal deficits.   Lab Results  Component Value Date   CREATININE 0.70 02/08/2020   BUN 20 02/08/2020   NA 144 02/08/2020   K 4.1 02/08/2020   CL 100 02/08/2020   CO2 25 02/08/2020   Lab Results  Component Value Date  ALT 21 02/08/2020   AST 12 02/08/2020   GGT 75 (H) 02/08/2020   ALKPHOS 140 (H) 02/08/2020   BILITOT 0.2 02/08/2020   Lab Results  Component Value Date   HGBA1C 6.4 (H) 02/08/2020   Lab Results  Component Value Date   INSULIN 18.5 02/08/2020   INSULIN 15.9 08/27/2019   Lab Results  Component Value Date   TSH 1.600 02/08/2020   Lab Results  Component Value Date   CHOL 176 02/08/2020   HDL 64 02/08/2020   LDLCALC 91 02/08/2020   TRIG 121 02/08/2020   Lab Results  Component Value Date   WBC 12.3 (H) 02/08/2020   HGB 11.8 02/08/2020   HCT 37.7 02/08/2020   MCV 85  02/08/2020   PLT 501 (H) 02/08/2020   Lab Results  Component Value Date   IRON 46 02/08/2020   TIBC 378 02/08/2020   FERRITIN 70 02/08/2020   Obesity Behavioral Intervention:   Approximately 15 minutes were spent on the discussion below.  ASK: We discussed the diagnosis of obesity with Virginia Luna today and Virginia Luna agreed to give [name] permission to discuss obesity behavioral modification therapy today.  ASSESS: Virginia Luna has the diagnosis of obesity and her BMI today is 53.9. Virginia Luna is in the action stage of change.   ADVISE: Virginia Luna was educated on the multiple health risks of obesity as well as the benefit of weight loss to improve her health. She was advised of the need for long term treatment and the importance of lifestyle modifications to improve her current health and to decrease her risk of future health problems.  AGREE: Multiple dietary modification options and treatment options were discussed and Virginia Luna agreed to follow the recommendations documented in the above note.  ARRANGE: Virginia Luna was educated on the importance of frequent visits to treat obesity as outlined per CMS and USPSTF guidelines and agreed to schedule her next follow up appointment today.  Attestation Statements:   Reviewed by clinician on day of visit: allergies, medications, problem list, medical history, surgical history, family history, social history, and previous encounter notes.  I, Water quality scientist, CMA, am acting as transcriptionist for Virginia Deutscher, DO  I have reviewed the above documentation for accuracy and completeness, and I agree with the above. Virginia Deutscher, DO

## 2020-10-14 ENCOUNTER — Encounter (INDEPENDENT_AMBULATORY_CARE_PROVIDER_SITE_OTHER): Payer: Self-pay | Admitting: Family Medicine

## 2020-10-15 ENCOUNTER — Encounter (INDEPENDENT_AMBULATORY_CARE_PROVIDER_SITE_OTHER): Payer: Self-pay | Admitting: Family Medicine

## 2020-10-19 DIAGNOSIS — J301 Allergic rhinitis due to pollen: Secondary | ICD-10-CM | POA: Diagnosis not present

## 2020-10-19 DIAGNOSIS — J3089 Other allergic rhinitis: Secondary | ICD-10-CM | POA: Diagnosis not present

## 2020-10-19 DIAGNOSIS — J3081 Allergic rhinitis due to animal (cat) (dog) hair and dander: Secondary | ICD-10-CM | POA: Diagnosis not present

## 2020-10-24 DIAGNOSIS — E7800 Pure hypercholesterolemia, unspecified: Secondary | ICD-10-CM | POA: Diagnosis not present

## 2020-10-24 DIAGNOSIS — K219 Gastro-esophageal reflux disease without esophagitis: Secondary | ICD-10-CM | POA: Diagnosis not present

## 2020-10-24 DIAGNOSIS — I10 Essential (primary) hypertension: Secondary | ICD-10-CM | POA: Diagnosis not present

## 2020-10-24 DIAGNOSIS — D649 Anemia, unspecified: Secondary | ICD-10-CM | POA: Diagnosis not present

## 2020-10-24 DIAGNOSIS — J4520 Mild intermittent asthma, uncomplicated: Secondary | ICD-10-CM | POA: Diagnosis not present

## 2020-10-24 DIAGNOSIS — E119 Type 2 diabetes mellitus without complications: Secondary | ICD-10-CM | POA: Diagnosis not present

## 2020-10-26 ENCOUNTER — Other Ambulatory Visit (INDEPENDENT_AMBULATORY_CARE_PROVIDER_SITE_OTHER): Payer: Self-pay | Admitting: Family Medicine

## 2020-10-26 DIAGNOSIS — K5909 Other constipation: Secondary | ICD-10-CM

## 2020-10-26 DIAGNOSIS — E1169 Type 2 diabetes mellitus with other specified complication: Secondary | ICD-10-CM

## 2020-10-26 DIAGNOSIS — E538 Deficiency of other specified B group vitamins: Secondary | ICD-10-CM

## 2020-10-26 DIAGNOSIS — E559 Vitamin D deficiency, unspecified: Secondary | ICD-10-CM

## 2020-10-26 DIAGNOSIS — G43809 Other migraine, not intractable, without status migrainosus: Secondary | ICD-10-CM

## 2020-10-26 DIAGNOSIS — R110 Nausea: Secondary | ICD-10-CM

## 2020-10-26 DIAGNOSIS — Z79899 Other long term (current) drug therapy: Secondary | ICD-10-CM

## 2020-10-26 DIAGNOSIS — J301 Allergic rhinitis due to pollen: Secondary | ICD-10-CM | POA: Diagnosis not present

## 2020-10-26 DIAGNOSIS — J3081 Allergic rhinitis due to animal (cat) (dog) hair and dander: Secondary | ICD-10-CM | POA: Diagnosis not present

## 2020-10-26 DIAGNOSIS — F5105 Insomnia due to other mental disorder: Secondary | ICD-10-CM

## 2020-10-26 DIAGNOSIS — J3089 Other allergic rhinitis: Secondary | ICD-10-CM | POA: Diagnosis not present

## 2020-10-26 NOTE — Telephone Encounter (Signed)
Dr.Wallace °

## 2020-10-27 MED ORDER — SUMATRIPTAN SUCCINATE 100 MG PO TABS
ORAL_TABLET | ORAL | 0 refills | Status: DC
Start: 2020-10-27 — End: 2020-11-22

## 2020-10-27 NOTE — Telephone Encounter (Signed)
Please advise about refill

## 2020-10-30 ENCOUNTER — Other Ambulatory Visit: Payer: Self-pay

## 2020-10-30 ENCOUNTER — Ambulatory Visit
Admission: RE | Admit: 2020-10-30 | Discharge: 2020-10-30 | Disposition: A | Discharge status: Home / Self Care | Payer: Medicare HMO | Source: Ambulatory Visit | Attending: Neurological Surgery | Admitting: Neurological Surgery

## 2020-10-30 DIAGNOSIS — G9389 Other specified disorders of brain: Secondary | ICD-10-CM | POA: Diagnosis not present

## 2020-10-30 MED ORDER — GADOBENATE DIMEGLUMINE 529 MG/ML IV SOLN
20.0000 mL | Freq: Once | INTRAVENOUS | Status: AC | PRN
  Administered 2020-10-30: 20 mL via INTRAVENOUS

## 2020-10-31 DIAGNOSIS — Z79899 Other long term (current) drug therapy: Secondary | ICD-10-CM | POA: Diagnosis not present

## 2020-10-31 DIAGNOSIS — E119 Type 2 diabetes mellitus without complications: Secondary | ICD-10-CM | POA: Diagnosis not present

## 2020-10-31 DIAGNOSIS — I10 Essential (primary) hypertension: Secondary | ICD-10-CM | POA: Diagnosis not present

## 2020-10-31 DIAGNOSIS — K219 Gastro-esophageal reflux disease without esophagitis: Secondary | ICD-10-CM | POA: Diagnosis not present

## 2020-10-31 DIAGNOSIS — Z7984 Long term (current) use of oral hypoglycemic drugs: Secondary | ICD-10-CM | POA: Diagnosis not present

## 2020-10-31 DIAGNOSIS — J4520 Mild intermittent asthma, uncomplicated: Secondary | ICD-10-CM | POA: Diagnosis not present

## 2020-10-31 DIAGNOSIS — M797 Fibromyalgia: Secondary | ICD-10-CM | POA: Diagnosis not present

## 2020-10-31 DIAGNOSIS — M7061 Trochanteric bursitis, right hip: Secondary | ICD-10-CM | POA: Diagnosis not present

## 2020-10-31 DIAGNOSIS — G43909 Migraine, unspecified, not intractable, without status migrainosus: Secondary | ICD-10-CM | POA: Diagnosis not present

## 2020-11-02 DIAGNOSIS — J3081 Allergic rhinitis due to animal (cat) (dog) hair and dander: Secondary | ICD-10-CM | POA: Diagnosis not present

## 2020-11-02 DIAGNOSIS — J3089 Other allergic rhinitis: Secondary | ICD-10-CM | POA: Diagnosis not present

## 2020-11-02 DIAGNOSIS — J301 Allergic rhinitis due to pollen: Secondary | ICD-10-CM | POA: Diagnosis not present

## 2020-11-07 NOTE — Telephone Encounter (Signed)
Thank you :)

## 2020-11-07 NOTE — Telephone Encounter (Signed)
Has this been addressed?

## 2020-11-09 DIAGNOSIS — J3081 Allergic rhinitis due to animal (cat) (dog) hair and dander: Secondary | ICD-10-CM | POA: Diagnosis not present

## 2020-11-09 DIAGNOSIS — J3089 Other allergic rhinitis: Secondary | ICD-10-CM | POA: Diagnosis not present

## 2020-11-09 DIAGNOSIS — J301 Allergic rhinitis due to pollen: Secondary | ICD-10-CM | POA: Diagnosis not present

## 2020-11-13 DIAGNOSIS — J40 Bronchitis, not specified as acute or chronic: Secondary | ICD-10-CM | POA: Diagnosis not present

## 2020-11-13 DIAGNOSIS — R059 Cough, unspecified: Secondary | ICD-10-CM | POA: Diagnosis not present

## 2020-11-13 DIAGNOSIS — R0981 Nasal congestion: Secondary | ICD-10-CM | POA: Diagnosis not present

## 2020-11-13 DIAGNOSIS — Z8709 Personal history of other diseases of the respiratory system: Secondary | ICD-10-CM | POA: Diagnosis not present

## 2020-11-13 DIAGNOSIS — Z03818 Encounter for observation for suspected exposure to other biological agents ruled out: Secondary | ICD-10-CM | POA: Diagnosis not present

## 2020-11-16 DIAGNOSIS — J301 Allergic rhinitis due to pollen: Secondary | ICD-10-CM | POA: Diagnosis not present

## 2020-11-16 DIAGNOSIS — J3089 Other allergic rhinitis: Secondary | ICD-10-CM | POA: Diagnosis not present

## 2020-11-16 DIAGNOSIS — J3081 Allergic rhinitis due to animal (cat) (dog) hair and dander: Secondary | ICD-10-CM | POA: Diagnosis not present

## 2020-11-17 ENCOUNTER — Ambulatory Visit
Admission: RE | Admit: 2020-11-17 | Discharge: 2020-11-17 | Disposition: A | Discharge status: Home / Self Care | Payer: Medicare HMO | Source: Ambulatory Visit | Attending: Family Medicine | Admitting: Family Medicine

## 2020-11-17 ENCOUNTER — Encounter (INDEPENDENT_AMBULATORY_CARE_PROVIDER_SITE_OTHER): Payer: Self-pay | Admitting: Family Medicine

## 2020-11-17 ENCOUNTER — Other Ambulatory Visit: Payer: Self-pay

## 2020-11-17 DIAGNOSIS — Z1231 Encounter for screening mammogram for malignant neoplasm of breast: Secondary | ICD-10-CM | POA: Diagnosis not present

## 2020-11-18 ENCOUNTER — Ambulatory Visit: Payer: Medicare HMO

## 2020-11-18 ENCOUNTER — Other Ambulatory Visit (INDEPENDENT_AMBULATORY_CARE_PROVIDER_SITE_OTHER): Payer: Self-pay | Admitting: Family Medicine

## 2020-11-18 DIAGNOSIS — K5909 Other constipation: Secondary | ICD-10-CM

## 2020-11-18 DIAGNOSIS — F5105 Insomnia due to other mental disorder: Secondary | ICD-10-CM

## 2020-11-18 DIAGNOSIS — G43809 Other migraine, not intractable, without status migrainosus: Secondary | ICD-10-CM

## 2020-11-18 DIAGNOSIS — R110 Nausea: Secondary | ICD-10-CM

## 2020-11-18 DIAGNOSIS — E559 Vitamin D deficiency, unspecified: Secondary | ICD-10-CM

## 2020-11-18 DIAGNOSIS — Z79899 Other long term (current) drug therapy: Secondary | ICD-10-CM

## 2020-11-18 DIAGNOSIS — E538 Deficiency of other specified B group vitamins: Secondary | ICD-10-CM

## 2020-11-18 DIAGNOSIS — E1169 Type 2 diabetes mellitus with other specified complication: Secondary | ICD-10-CM

## 2020-11-19 DIAGNOSIS — S52001A Unspecified fracture of upper end of right ulna, initial encounter for closed fracture: Secondary | ICD-10-CM | POA: Diagnosis not present

## 2020-11-19 DIAGNOSIS — M25531 Pain in right wrist: Secondary | ICD-10-CM | POA: Diagnosis not present

## 2020-11-19 DIAGNOSIS — W19XXXA Unspecified fall, initial encounter: Secondary | ICD-10-CM | POA: Diagnosis not present

## 2020-11-19 DIAGNOSIS — S52591A Other fractures of lower end of right radius, initial encounter for closed fracture: Secondary | ICD-10-CM | POA: Diagnosis not present

## 2020-11-20 DIAGNOSIS — M25531 Pain in right wrist: Secondary | ICD-10-CM | POA: Diagnosis not present

## 2020-11-21 ENCOUNTER — Encounter (INDEPENDENT_AMBULATORY_CARE_PROVIDER_SITE_OTHER): Payer: Self-pay | Admitting: Family Medicine

## 2020-11-21 ENCOUNTER — Other Ambulatory Visit: Payer: Self-pay

## 2020-11-21 ENCOUNTER — Ambulatory Visit (INDEPENDENT_AMBULATORY_CARE_PROVIDER_SITE_OTHER): Payer: Medicare HMO | Admitting: Family Medicine

## 2020-11-21 VITALS — BP 147/89 | HR 98 | Temp 98.4°F | Ht 61.0 in | Wt 278.0 lb

## 2020-11-21 DIAGNOSIS — S52691D Other fracture of lower end of right ulna, subsequent encounter for closed fracture with routine healing: Secondary | ICD-10-CM

## 2020-11-21 DIAGNOSIS — Z6841 Body Mass Index (BMI) 40.0 and over, adult: Secondary | ICD-10-CM | POA: Diagnosis not present

## 2020-11-21 DIAGNOSIS — M25531 Pain in right wrist: Secondary | ICD-10-CM | POA: Diagnosis not present

## 2020-11-21 DIAGNOSIS — F5105 Insomnia due to other mental disorder: Secondary | ICD-10-CM | POA: Diagnosis not present

## 2020-11-21 DIAGNOSIS — G9389 Other specified disorders of brain: Secondary | ICD-10-CM

## 2020-11-21 DIAGNOSIS — E538 Deficiency of other specified B group vitamins: Secondary | ICD-10-CM

## 2020-11-21 DIAGNOSIS — E559 Vitamin D deficiency, unspecified: Secondary | ICD-10-CM

## 2020-11-21 DIAGNOSIS — J3081 Allergic rhinitis due to animal (cat) (dog) hair and dander: Secondary | ICD-10-CM | POA: Diagnosis not present

## 2020-11-21 DIAGNOSIS — R110 Nausea: Secondary | ICD-10-CM

## 2020-11-21 DIAGNOSIS — G43809 Other migraine, not intractable, without status migrainosus: Secondary | ICD-10-CM

## 2020-11-21 DIAGNOSIS — Z79899 Other long term (current) drug therapy: Secondary | ICD-10-CM

## 2020-11-21 DIAGNOSIS — R55 Syncope and collapse: Secondary | ICD-10-CM | POA: Diagnosis not present

## 2020-11-21 DIAGNOSIS — K5909 Other constipation: Secondary | ICD-10-CM

## 2020-11-21 DIAGNOSIS — J301 Allergic rhinitis due to pollen: Secondary | ICD-10-CM | POA: Diagnosis not present

## 2020-11-21 DIAGNOSIS — E66813 Obesity, class 3: Secondary | ICD-10-CM

## 2020-11-21 DIAGNOSIS — E1169 Type 2 diabetes mellitus with other specified complication: Secondary | ICD-10-CM

## 2020-11-21 DIAGNOSIS — J3089 Other allergic rhinitis: Secondary | ICD-10-CM | POA: Diagnosis not present

## 2020-11-21 NOTE — Progress Notes (Addendum)
PCP - Ernestine Conrad MD  [facility] Cardiologist - no  PPM/ICD -  Device Orders -  Rep Notified -   Chest x-ray -  EKG - 11-23-20 Stress Test -  ECHO -  Cardiac Cath -  hgba1c 10-12-20 epic  6.0  Sleep Study -  CPAP -   Fasting Blood Sugar -  Checks Blood Sugar ___0__ times a day  Blood Thinner Instructions: Aspirin Instructions:  ERAS Protcol - PRE-SURGERY Ensure or G2-   COVID TEST- 4-5  Activity-- Able to to walk to mailbox without SOB and ADL's Anesthesia review: BMI 50.54  Patient denies shortness of breath, fever, cough and chest pain at PAT appointment   All instructions explained to the patient, with a verbal understanding of the material. Patient agrees to go over the instructions while at home for a better understanding. Patient also instructed to self quarantine after being tested for COVID-19. The opportunity to ask questions was provided.

## 2020-11-21 NOTE — Telephone Encounter (Signed)
Dr.Wallace °

## 2020-11-21 NOTE — Telephone Encounter (Signed)
Pt has appt today

## 2020-11-21 NOTE — Patient Instructions (Addendum)
DUE TO COVID-19 ONLY ONE VISITOR IS ALLOWED TO COME WITH YOU AND STAY IN THE WAITING ROOM ONLY DURING PRE OP AND PROCEDURE DAY OF SURGERY.   TWO VISITOR  MAY VISIT WITH YOU AFTER SURGERY IN YOUR PRIVATE ROOM DURING VISITING HOURS ONLY!  YOU NEED TO HAVE A COVID 19 TEST ON__4-5-22_____ @_______ , THIS TEST MUST BE DONE BEFORE SURGERY,  COVID TESTING SITE [address] [place] [address], IT IS ON THE RIGHT GOING OUT [address] APPROXIMATELY  2 MINUTES PAST [employer] ON THE RIGHT. ONCE YOUR COVID TEST IS COMPLETED,  PLEASE BEGIN THE QUARANTINE INSTRUCTIONS AS OUTLINED IN YOUR HANDOUT.                KEIRAH KONITZER  11/21/2020   Your procedure is scheduled on: 11-24-20   Report to [facility] Main  Entrance   Report to short stay   at       [place]  AM     Call this number if you have problems the morning of surgery [phone]    Remember: Do not eat food :After Midnight. You may have clear liquids until 0430 am then nothing by mouth    CLEAR LIQUID DIET   Foods Allowed                                                                                    Foods Excluded  Black Coffee and tea, regular and decaf                             liquids that you cannot  Plain Jell-O any favor except red or purple                                           see through such as: Fruit ices (not with fruit pulp)                                                         milk, soups, orange juice  Iced Popsicles                                                       All solid food Carbonated beverages, regular and diet                                    Cranberry, grape and apple juices Sports drinks like Gatorade Lightly seasoned clear broth or consume(fat free) Sugar, honey syrup   _____________________________________________________________________      BRUSH YOUR TEETH MORNING OF SURGERY AND RINSE YOUR MOUTH OUT,  NO CHEWING GUM CANDY OR MINTS.     Take  these medicines the morning of surgery with A SIP OF WATER:   DO NOT TAKE ANY DIABETIC MEDICATIONS DAY OF YOUR SURGERY                               You may not have any metal on your body including hair pins and              piercings  Do not wear jewelry, make-up, lotions, powders or perfumes, deodorant             Do not wear nail polish on your fingernails.  Do not shave  48 hours prior to surgery.     Do not bring valuables to the hospital. [place].  Contacts, dentures or bridgework may not be worn into surgery.      Patients discharged the day of surgery will not be allowed to drive home. IF YOU ARE HAVING SURGERY AND GOING HOME THE SAME DAY, YOU MUST HAVE AN ADULT TO DRIVE YOU HOME AND BE WITH YOU FOR 24 HOURS. YOU MAY GO HOME BY TAXI OR UBER OR ORTHERWISE, BUT AN ADULT MUST ACCOMPANY YOU HOME AND STAY WITH YOU FOR 24 HOURS.  Name and phone number of your driver:  Special Instructions: N/A              Please read over the following fact sheets you were given: _____________________________________________________________________             [facility] - Preparing for Surgery Before surgery, you can play an important role.  Because skin is not sterile, your skin needs to be as free of germs as possible.  You can reduce the number of germs on your skin by washing with CHG (chlorahexidine gluconate) soap before surgery.  CHG is an antiseptic cleaner which kills germs and bonds with the skin to continue killing germs even after washing. Please DO NOT use if you have an allergy to CHG or antibacterial soaps.  If your skin becomes reddened/irritated stop using the CHG and inform your nurse when you arrive at Short Stay. Do not shave (including legs and underarms) for at least 48 hours prior to the first CHG shower.  You may shave your face/neck. Please follow these instructions carefully:  1.  Shower with CHG Soap the night before  surgery and the  morning of Surgery.  2.  If you choose to wash your hair, wash your hair first as usual with your  normal  shampoo.  3.  After you shampoo, rinse your hair and body thoroughly to remove the  shampoo.                           4.  Use CHG as you would any other liquid soap.  You can apply chg directly  to the skin and wash                       Gently with a scrungie or clean washcloth.  5.  Apply the CHG Soap to your body ONLY FROM THE NECK DOWN.   Do not use on face/ open  Wound or open sores. Avoid contact with eyes, ears mouth and genitals (private parts).                       Wash face,  Genitals (private parts) with your normal soap.             6.  Wash thoroughly, paying special attention to the area where your surgery  will be performed.  7.  Thoroughly rinse your body with warm water from the neck down.  8.  DO NOT shower/wash with your normal soap after using and rinsing off  the CHG Soap.                9.  Pat yourself dry with a clean towel.            10.  Wear clean pajamas.            11.  Place clean sheets on your bed the night of your first shower and do not  sleep with pets. Day of Surgery : Do not apply any lotions/deodorants the morning of surgery.  Please wear clean clothes to the hospital/surgery center.  FAILURE TO FOLLOW THESE INSTRUCTIONS MAY RESULT IN THE CANCELLATION OF YOUR SURGERY PATIENT SIGNATURE_________________________________  NURSE SIGNATURE__________________________________  ________________________________________________________________________   Adam Phenix  An incentive spirometer is a tool that can help keep your lungs clear and active. This tool measures how well you are filling your lungs with each breath. Taking long deep breaths may help reverse or decrease the chance of developing breathing (pulmonary) problems (especially infection) following:  A long period of time when you are unable to  move or be active. BEFORE THE PROCEDURE   If the spirometer includes an indicator to show your best effort, your nurse or respiratory therapist will set it to a desired goal.  If possible, sit up straight or lean slightly forward. Try not to slouch.  Hold the incentive spirometer in an upright position. INSTRUCTIONS FOR USE  1. Sit on the edge of your bed if possible, or sit up as far as you can in bed or on a chair. 2. Hold the incentive spirometer in an upright position. 3. Breathe out normally. 4. Place the mouthpiece in your mouth and seal your lips tightly around it. 5. Breathe in slowly and as deeply as possible, raising the piston or the ball toward the top of the column. 6. Hold your breath for 3-5 seconds or for as long as possible. Allow the piston or ball to fall to the bottom of the column. 7. Remove the mouthpiece from your mouth and breathe out normally. 8. Rest for a few seconds and repeat Steps 1 through 7 at least 10 times every 1-2 hours when you are awake. Take your time and take a few normal breaths between deep breaths. 9. The spirometer may include an indicator to show your best effort. Use the indicator as a goal to work toward during each repetition. 10. After each set of 10 deep breaths, practice coughing to be sure your lungs are clear. If you have an incision (the cut made at the time of surgery), support your incision when coughing by placing a pillow or rolled up towels firmly against it. Once you are able to get out of bed, walk around indoors and cough well. You may stop using the incentive spirometer when instructed by your caregiver.  RISKS AND COMPLICATIONS  Take your time so you do not get  dizzy or light-headed.  If you are in pain, you may need to take or ask for pain medication before doing incentive spirometry. It is harder to take a deep breath if you are having pain. AFTER USE  Rest and breathe slowly and easily.  It can be helpful to keep track of  a log of your progress. Your caregiver can provide you with a simple table to help with this. If you are using the spirometer at home, follow these instructions: [place] IF:   You are having difficultly using the spirometer.  You have trouble using the spirometer as often as instructed.  Your pain medication is not giving enough relief while using the spirometer.  You develop fever of 100.5 F (38.1 C) or higher. SEEK IMMEDIATE MEDICAL CARE IF:   You cough up bloody sputum that had not been present before.  You develop fever of 102 F (38.9 C) or greater.  You develop worsening pain at or near the incision site. MAKE SURE YOU:   Understand these instructions.  Will watch your condition.  Will get help right away if you are not doing well or get worse. Document Released: 12/17/2006 Document Revised: 10/29/2011 Document Reviewed: 02/17/2007 [facility] Patient Information 2014 [place], [place].   ________________________________________________________________________

## 2020-11-21 NOTE — H&P (Signed)
PREOPERATIVE H&P  Chief Complaint: RIGHT WRIST FRACTURE  HPI: Virginia Luna is a 60 y.o. female who presents with a diagnosis of RIGHT WRIST FRACTURE after a [place] on 11/19/20. She had immediately pain and swelling and limited ROM. She came to our urgent care 11/20/20 where xrays show a fracture of the distal radius and ulnar styloid of the right wrist. Symptoms are rated as moderate to severe, and have been worsening.  This is significantly impairing activities of daily living. She has elected for surgical management.   Past Medical History:  Diagnosis Date  . Allergies   . Anemia   . Arthritis   . Asthma   . Back pain   . Chronic pain   . Diabetes ([place])   . Diabetes mellitus without complication ([place])   . Edema, lower extremity   . Fibroid   . Fibromyalgia   . High blood pressure   . History of stomach ulcers   . IBS (irritable bowel syndrome)   . Joint pain   . Sleep apnea    did use cpap-lost 25lb-says she does not need it  . Wears contact lenses    Past Surgical History:  Procedure Laterality Date  . BREAST BIOPSY    . BREAST EXCISIONAL BIOPSY    . BREAST LUMPECTOMY WITH RADIOACTIVE SEED LOCALIZATION Bilateral 11/24/2014   Procedure: BILATERAL BREAST LUMPECTOMY WITH RADIOACTIVE SEED LOCALIZATION;  Surgeon: [name], MD;  Location: [facility];  Service: General;  Laterality: Bilateral;  . CHOLECYSTECTOMY    . COLONOSCOPY    . DILATION AND CURETTAGE OF UTERUS    . ESOPHAGOGASTRODUODENOSCOPY (EGD) WITH PROPOFOL N/A 07/23/2016   Procedure: ESOPHAGOGASTRODUODENOSCOPY (EGD) WITH PROPOFOL;  Surgeon: Laurence Spates, MD;  Location: [facility];  Service: Endoscopy;  Laterality: N/A;  . ESOPHAGOGASTRODUODENOSCOPY (EGD) WITH PROPOFOL N/A 06/11/2018   Procedure: ESOPHAGOGASTRODUODENOSCOPY (EGD) WITH PROPOFOL;  Surgeon: Laurence Spates, MD;  Location: [facility];  Service: Endoscopy;  Laterality: N/A;  . LUMBAR LAMINECTOMY  2010  . UMBILICAL HERNIA REPAIR     age  22  . UPPER GI ENDOSCOPY     Social History   Socioeconomic History  . Marital status: Single    Spouse name: Not on file  . Number of children: Not on file  . Years of education: Not on file  . Highest education level: Not on file  Occupational History  . Occupation: disabled    Comment: Psychologist, occupational at daycare  Tobacco Use  . Smoking status: Never Smoker  . Smokeless tobacco: Never Used  Substance and Sexual Activity  . Alcohol use: No  . Drug use: No  . Sexual activity: Not Currently  Other Topics Concern  . Not on file  Social History Narrative  . Not on file   Social Determinants of Health   Financial Resource Strain: Not on file  Food Insecurity: Not on file  Transportation Needs: Not on file  Physical Activity: Not on file  Stress: Not on file  Social Connections: Not on file   Family History  Problem Relation Age of Onset  . Breast cancer Paternal Aunt   . Breast cancer Paternal Grandmother   . Obesity Mother   . Diabetes Father   . High blood pressure Father   . Sudden death Father    Allergies  Allergen Reactions  . Rocephin [Ceftriaxone] Anaphylaxis  . Morphine And Related Nausea And Vomiting    Doesn't work  . Prednisone Itching and Swelling  . Sulfa Antibiotics Itching and  Swelling   Prior to Admission medications   Medication Sig Start Date End Date Taking? Authorizing Provider  albuterol (PROVENTIL HFA;VENTOLIN HFA) 108 (90 BASE) MCG/ACT inhaler Inhale 2 puffs into the lungs every 6 (six) hours as needed for wheezing or shortness of breath.     [provider]  amLODipine (NORVASC) 5 MG tablet Take 5 mg by mouth daily.    [provider]  atorvastatin (LIPITOR) 10 MG tablet Take 10 mg by mouth at bedtime. 04/27/20   [provider]  azelastine (ASTELIN) 0.1 % nasal spray Place 1 spray into both nostrils 2 (two) times daily as needed for allergies. 04/18/18   [provider]  Azelastine-Fluticasone 137-50 MCG/ACT  SUSP Place 1 spray into the nose at bedtime.    [provider]  cyclobenzaprine (FLEXERIL) 10 MG tablet Take 10 mg by mouth 2 (two) times daily as needed for muscle spasms.     [provider]  diclofenac (VOLTAREN) 75 MG EC tablet  12/26/18   [provider]  diphenhydrAMINE (BENADRYL) 25 MG tablet Take 50 mg by mouth at bedtime.    [provider]  EPINEPHrine 0.3 mg/0.3 mL IJ SOAJ injection Inject 0.3 mg into the muscle once.    [provider]  fexofenadine (ALLEGRA) 180 MG tablet Take 180 mg by mouth daily. 05/26/18   [provider]  fluticasone (FLONASE) 50 MCG/ACT nasal spray INSTILL 1 SPRAY IN EACH NOSTRIL TWICE A DAY 05/01/19   [provider]  fluticasone furoate-vilanterol (BREO ELLIPTA) 200-25 MCG/INH AEPB Inhale 1 puff daily into the lungs.    [provider]  gabapentin (NEURONTIN) 300 MG capsule Take 300 mg by mouth at bedtime.  12/29/18   [provider]  Galcanezumab-gnlm (EMGALITY) 120 MG/ML SOAJ Inject 120 mg into the skin every 28 (twenty-eight) days. 09/26/20   Pieter Partridge, DO  HYDROcodone-acetaminophen (NORCO/VICODIN) 5-325 MG tablet Take 1 tablet by mouth 2 (two) times daily as needed for moderate pain.  05/10/18   [provider]  Insulin Pen Needle (BD PEN NEEDLE NANO 2ND GEN) 32G X 4 MM MISC 1 Package by Does not apply route daily. 11/25/19   Briscoe Deutscher, DO  linaclotide [facility]) 72 MCG capsule Take 1 capsule (72 mcg total) by mouth every morning. 10/12/20   Briscoe Deutscher, DO  meclizine (ANTIVERT) 25 MG tablet TAKE ONE TABLET BY MOUTH EVERY 8 HOURS AS NEEDED FOR DIZZINESS 10/12/20   Briscoe Deutscher, DO  Melatonin 5 MG CAPS Take 10-15 mg by mouth at bedtime.    [provider]  metFORMIN (GLUCOPHAGE-XR) 500 MG 24 hr tablet Take 500 mg by mouth at bedtime.     [provider]  metoprolol succinate (TOPROL-XL) 25 MG 24 hr tablet Take 25 mg by mouth every morning. 04/26/20    [provider]  ondansetron (ZOFRAN ODT) 4 MG disintegrating tablet Take 1 tablet (4 mg total) by mouth every 6 (six) hours as needed for nausea or vomiting. 09/14/20   Briscoe Deutscher, DO  oxybutynin (DITROPAN) 5 MG tablet Take 5 mg by mouth 2 (two) times daily as needed for bladder spasms. 04/22/18   [provider]  oxyCODONE (OXY IR/ROXICODONE) 5 MG immediate release tablet Take 5 mg by mouth 2 (two) times daily as needed for pain. 05/10/18   [provider]  pantoprazole (PROTONIX) 40 MG tablet Take 40 mg by mouth 2 (two) times daily. 04/19/18   [provider]  QUEtiapine (SEROQUEL) 300 MG tablet  TAKE ONE TABLET BY MOUTH EVERYDAY AT BEDTIME 07/20/20   Cottle, Billey Co., MD  Semaglutide,0.25 or 0.5MG /DOS, (OZEMPIC, 0.25 OR 0.5 MG/DOSE,) 2 MG/1.5ML SOPN Inject 0.5 mg into the skin once a week. 10/12/20   Briscoe Deutscher, DO  SUMAtriptan (IMITREX) 100 MG tablet TAKE ONE TABLET BY MOUTH DAILY AS NEEDED FOR MIGRAINE. MAY REPEAT IN 2 HOURS IF HEADACHE PERSISTS OR RECURS 10/27/20   Briscoe Deutscher, DO  torsemide (DEMADEX) 20 MG tablet Take 20 mg by mouth daily as needed (swelling).    [provider]  triamcinolone cream (KENALOG) 0.1 % Apply 1 application topically 2 (two) times daily as needed for rash. 05/19/18   [provider]  Vitamin D, Ergocalciferol, (DRISDOL) 1.25 MG (50000 UNIT) CAPS capsule Take 1 capsule (50,000 Units total) by mouth every 7 (seven) days. 10/12/20   Briscoe Deutscher, DO  zolpidem (AMBIEN) 10 MG tablet TAKE ONE TABLET BY MOUTH EVERYDAY AT BEDTIME 09/13/20   Cottle, Billey Co., MD     Positive ROS: All other systems have been reviewed and were otherwise negative with the exception of those mentioned in the HPI and as above.  Physical Exam: General: Alert, no acute distress Cardiovascular: No pedal edema Respiratory: No cyanosis, no use of accessory musculature GI: No organomegaly, abdomen is soft and non-tender Skin: No  lesions in the area of chief complaint Neurologic: Sensation intact distally Psychiatric: Patient is competent for consent with normal mood and affect Lymphatic: No axillary or cervical lymphadenopathy  MUSCULOSKELETAL: right wrist swelling, TTP distal wrist both dorsal and volar, NVI, very limited ROM   Assessment: RIGHT WRIST FRACTURE  Plan: Plan for Procedure(s): OPEN REDUCTION INTERNAL FIXATION (ORIF) WRIST FRACTURE  The risks benefits and alternatives were discussed with the patient including but not limited to the risks of nonoperative treatment, versus surgical intervention including infection, bleeding, nerve injury,  blood clots, cardiopulmonary complications, morbidity, mortality, among others, and they were willing to proceed.    Post-op Weightbearing: NWB RUE Orthopedic devices: splint and sling Showering: POD 3, keep splint and dressings dry Dressing: splint Medicines: Norco 10, Mobic, Tylenol, Robaxin, Zofran  Discharge: home Follow up: 2 weeks in the office   Alisa Graff Office [phone]   11/21/2020 4:05 PM

## 2020-11-21 NOTE — Telephone Encounter (Signed)
Last seen by Dr. Wallace. 

## 2020-11-21 NOTE — Telephone Encounter (Signed)
[facility] physicians called in and stated that this pt needs a refill sent in for her Vitamin D. I informed them that I will send a message. Please advise

## 2020-11-21 NOTE — Progress Notes (Signed)
Please place orders in epic pt. Is scheduled for a preop  

## 2020-11-22 ENCOUNTER — Other Ambulatory Visit (HOSPITAL_COMMUNITY)
Admission: RE | Admit: 2020-11-22 | Discharge: 2020-11-22 | Disposition: A | Discharge status: Home / Self Care | Payer: Medicare HMO | Source: Ambulatory Visit | Attending: Orthopedic Surgery | Admitting: Orthopedic Surgery

## 2020-11-22 DIAGNOSIS — Z01812 Encounter for preprocedural laboratory examination: Secondary | ICD-10-CM | POA: Insufficient documentation

## 2020-11-22 DIAGNOSIS — Z20822 Contact with and (suspected) exposure to covid-19: Secondary | ICD-10-CM | POA: Diagnosis not present

## 2020-11-22 MED ORDER — ONDANSETRON 4 MG PO TBDP
4.0000 mg | ORAL_TABLET | Freq: Four times a day (QID) | ORAL | 0 refills | Status: DC | PRN
Start: 2020-11-22 — End: 2021-01-09

## 2020-11-22 MED ORDER — MECLIZINE HCL 25 MG PO TABS: ORAL_TABLET | ORAL | 0 refills | Status: DC

## 2020-11-22 MED ORDER — VITAMIN D (ERGOCALCIFEROL) 1.25 MG (50000 UNIT) PO CAPS: 50000.0000 [IU] | ORAL_CAPSULE | ORAL | 0 refills | Status: DC

## 2020-11-22 NOTE — Progress Notes (Signed)
Chief Complaint:   OBESITY Virginia Luna is here to discuss her progress with her obesity treatment plan along with follow-up of her obesity related diagnoses.   Today's visit was #: 4 Starting weight: 288 lbs Starting date: 08/27/2019 Today's weight: 278 lbs Today's date: 11/21/2020 Total lbs lost to date: 10 lbs Body mass index is 52.53 kg/m.  Total weight loss percentage to date: -3.47%  Interim History: Virginia Luna had a syncopal episode on 11/19/20 and her dizziness has become worse. She endorses increased urination. Surgery scheduled for 11/24/20 with Dr. Percell Miller for right wrist fracture.   Current Meal Plan: practicing portion control and making smarter food choices, such as increasing vegetables and decreasing simple carbohydrates for 0% of the time.  Current Exercise Plan: None at this time. Current Anti-Obesity Medications: Ozempic 0.5 mg subcutaneously weekly. Side effects: none.  Care Team: PCP: Dr. Jeanette Caprice Allergist: Dr. Sylvie Farrier Allergy Psychiatry: Dr. Clovis Pu Neurology: Dr. Tomi Likens Neurosurgery:Dr. Ronnald Ramp Orthopedist: Dr. Percell Miller  Current Outpatient Medications:  .  albuterol (PROVENTIL HFA;VENTOLIN HFA) 108 (90 BASE) MCG/ACT inhaler, Inhale 2 puffs into the lungs every 6 (six) hours as needed for wheezing or shortness of breath. , Disp: , Rfl:  .  amLODipine (NORVASC) 5 MG tablet, Take 5 mg by mouth daily., Disp: , Rfl:  .  atorvastatin (LIPITOR) 10 MG tablet, Take 10 mg by mouth at bedtime., Disp: , Rfl:  .  azelastine (ASTELIN) 0.1 % nasal spray, Place 1 spray into both nostrils 2 (two) times daily as needed for allergies., Disp: , Rfl: 2 .  Azelastine-Fluticasone 137-50 MCG/ACT SUSP, Place 1 spray into the nose at bedtime., Disp: , Rfl:  .  cyclobenzaprine (FLEXERIL) 10 MG tablet, Take 10 mg by mouth 2 (two) times daily as needed for muscle spasms. , Disp: , Rfl:  .  diclofenac (VOLTAREN) 75 MG EC tablet, , Disp: , Rfl:  .  diphenhydrAMINE (BENADRYL) 25  MG tablet, Take 50 mg by mouth at bedtime., Disp: , Rfl:  .  fexofenadine (ALLEGRA) 180 MG tablet, Take 180 mg by mouth daily., Disp: , Rfl: 5 .  fluticasone (FLONASE) 50 MCG/ACT nasal spray, INSTILL 1 SPRAY IN EACH NOSTRIL TWICE A DAY, Disp: , Rfl:  .  fluticasone furoate-vilanterol (BREO ELLIPTA) 200-25 MCG/INH AEPB, Inhale 1 puff daily into the lungs., Disp: , Rfl:  .  gabapentin (NEURONTIN) 300 MG capsule, Take 300 mg by mouth at bedtime. , Disp: , Rfl:  .  Galcanezumab-gnlm (EMGALITY) 120 MG/ML SOAJ, Inject 120 mg into the skin every 28 (twenty-eight) days., Disp: 1.12 mL, Rfl: 5 .  HYDROcodone-acetaminophen (NORCO/VICODIN) 5-325 MG tablet, Take 1 tablet by mouth 2 (two) times daily as needed for moderate pain. , Disp: , Rfl: 0 .  Insulin Pen Needle (BD PEN NEEDLE NANO 2ND GEN) 32G X 4 MM MISC, 1 Package by Does not apply route daily., Disp: 100 each, Rfl: 0 .  linaclotide (LINZESS) 72 MCG capsule, Take 1 capsule (72 mcg total) by mouth every morning., Disp: 90 capsule, Rfl: 0 .  Melatonin 5 MG CAPS, Take 10-15 mg by mouth at bedtime., Disp: , Rfl:  .  metFORMIN (GLUCOPHAGE-XR) 500 MG 24 hr tablet, Take 500 mg by mouth at bedtime. , Disp: , Rfl:  .  metoprolol succinate (TOPROL-XL) 25 MG 24 hr tablet, Take 25 mg by mouth every morning., Disp: , Rfl:  .  oxybutynin (DITROPAN) 5 MG tablet, Take 5 mg by mouth 2 (two) times daily as needed for bladder  spasms., Disp: , Rfl: 0 .  oxyCODONE (OXY IR/ROXICODONE) 5 MG immediate release tablet, Take 5 mg by mouth 2 (two) times daily as needed for pain., Disp: , Rfl: 0 .  pantoprazole (PROTONIX) 40 MG tablet, Take 40 mg by mouth 2 (two) times daily., Disp: , Rfl: 1 .  QUEtiapine (SEROQUEL) 300 MG tablet, TAKE ONE TABLET BY MOUTH EVERYDAY AT BEDTIME, Disp: 90 tablet, Rfl: 3 .  Semaglutide,0.25 or 0.5MG /DOS, (OZEMPIC, 0.25 OR 0.5 MG/DOSE,) 2 MG/1.5ML SOPN, Inject 0.5 mg into the skin once a week., Disp: 1.5 mL, Rfl: 0 .  SUMAtriptan (IMITREX) 100 MG tablet,  TAKE ONE TABLET BY MOUTH DAILY AS NEEDED FOR MIGRAINE. MAY REPEAT IN 2 HOURS IF HEADACHE PERSISTS OR RECURS, Disp: 9 tablet, Rfl: 0 .  torsemide (DEMADEX) 20 MG tablet, Take 20 mg by mouth daily as needed (swelling)., Disp: , Rfl:  .  triamcinolone cream (KENALOG) 0.1 %, Apply 1 application topically 2 (two) times daily as needed for rash., Disp: , Rfl: 0 .  Vitamin D, Ergocalciferol, (DRISDOL) 1.25 MG (50000 UNIT) CAPS capsule, Take 1 capsule (50,000 Units total) by mouth every 7 (seven) days., Disp: 4 capsule, Rfl: 0 .  zolpidem (AMBIEN) 10 MG tablet, TAKE ONE TABLET BY MOUTH EVERYDAY AT BEDTIME, Disp: 30 tablet, Rfl: 4 .  meclizine (ANTIVERT) 25 MG tablet, TAKE ONE TABLET BY MOUTH EVERY 8 HOURS AS NEEDED FOR DIZZINESS, Disp: 30 tablet, Rfl: 0 .  ondansetron (ZOFRAN ODT) 4 MG disintegrating tablet, Take 1 tablet (4 mg total) by mouth every 6 (six) hours as needed for nausea or vomiting., Disp: 20 tablet, Rfl: 0  Assessment/Plan:   1. Syncope, unspecified syncope type Virginia Luna had a syncopal episode on 11/19/20 and her dizziness has become worse. Will message Neurology to let them know about her new symptom.  2. Other closed fracture of distal end of right ulna ORIF surgery scheduled for 11/24/2020.  3. Insomnia due to mental condition She is taking multiple sedating medications at night to help with sleep. Discussed safety of them as well as other anticholinergic effects.   Plan: Recommend sleep hygiene measures including regular sleep schedule, optimal sleep environment, and relaxing presleep rituals.   4. Polypharmacy Medication list above.   5. Brain mass Virginia Luna was recently found to have a brain mass on MRI.  She will follow-up with Neurology in 1 month for repeat surveillance.  6. Nausea Chronic, being followed by PCP and Neurology. Will refill her meclizine and zofran today, as per below.  - Refill meclizine (ANTIVERT) 25 MG tablet; TAKE ONE TABLET BY MOUTH EVERY 8 HOURS AS  NEEDED FOR DIZZINESS (Patient taking differently: Take 25 mg by mouth every 8 (eight) hours as needed for dizziness.)  Dispense: 30 tablet; Refill: 0 - Refill ondansetron (ZOFRAN ODT) 4 MG disintegrating tablet; Take 1 tablet (4 mg total) by mouth every 6 (six) hours as needed for nausea or vomiting.  Dispense: 20 tablet; Refill: 0  7. Obesity, current BMI 52.7  Course: Carlton is currently in the action stage of change. As such, her goal is to continue with weight loss efforts.   Nutrition goals: She has agreed to practicing portion control and making smarter food choices, such as increasing vegetables and decreasing simple carbohydrates.   Exercise goals: No exercise has been prescribed at this time.  Behavioral modification strategies: increasing lean protein intake, decreasing simple carbohydrates, increasing vegetables and increasing water intake.  Jenesys has agreed to follow-up with our clinic in 2 weeks.  She was informed of the importance of frequent follow-up visits to maximize her success with intensive lifestyle modifications for her multiple health conditions.   Objective:   Blood pressure (!) 147/89, pulse 98, temperature 98.4 F (36.9 C), temperature source Oral, height 5\' 1"  (1.549 m), weight 278 lb (126.1 kg), SpO2 100 %. Body mass index is 52.53 kg/m.  General: Cooperative, alert, well developed, in no acute distress. HEENT: Conjunctivae and lids unremarkable. Cardiovascular: Regular rhythm.  Lungs: Normal work of breathing. Neurologic: No focal deficits.   Obesity Behavioral Intervention:   Approximately 15 minutes were spent on the discussion below.  ASK: We discussed the diagnosis of obesity with Tanita today and Athalia agreed to give [name] permission to discuss obesity behavioral modification therapy today.  ASSESS: [facility] has the diagnosis of obesity and her BMI today is 52.7. Nemesis is in the action stage of change.   ADVISE: Chantrice was educated on the  multiple health risks of obesity as well as the benefit of weight loss to improve her health. She was advised of the need for long term treatment and the importance of lifestyle modifications to improve her current health and to decrease her risk of future health problems.  AGREE: Multiple dietary modification options and treatment options were discussed and Triana agreed to follow the recommendations documented in the above note.  ARRANGE: Peg was educated on the importance of frequent visits to treat obesity as outlined per CMS and USPSTF guidelines and agreed to schedule her next follow up appointment today.  Attestation Statements:   Reviewed by clinician on day of visit: allergies, medications, problem list, medical history, surgical history, family history, social history, and previous encounter notes.  Leodis Binet Friedenbach, CMA, am acting as Location manager for [employer], DO.  I have reviewed the above documentation for accuracy and completeness, and I agree with the above. Briscoe Deutscher, DO

## 2020-11-22 NOTE — Telephone Encounter (Signed)
Dr.Wallace °

## 2020-11-23 ENCOUNTER — Encounter (INDEPENDENT_AMBULATORY_CARE_PROVIDER_SITE_OTHER): Payer: Self-pay | Admitting: Family Medicine

## 2020-11-23 ENCOUNTER — Other Ambulatory Visit (HOSPITAL_COMMUNITY): Payer: Medicare HMO

## 2020-11-23 ENCOUNTER — Encounter (HOSPITAL_COMMUNITY): Payer: Self-pay

## 2020-11-23 ENCOUNTER — Encounter (HOSPITAL_COMMUNITY)
Admission: RE | Admit: 2020-11-23 | Discharge: 2020-11-23 | Disposition: A | Discharge status: Home / Self Care | Payer: Medicare HMO | Source: Ambulatory Visit | Attending: Orthopedic Surgery | Admitting: Orthopedic Surgery

## 2020-11-23 ENCOUNTER — Other Ambulatory Visit: Payer: Self-pay

## 2020-11-23 DIAGNOSIS — Z01818 Encounter for other preprocedural examination: Secondary | ICD-10-CM | POA: Insufficient documentation

## 2020-11-23 HISTORY — DX: Gastro-esophageal reflux disease without esophagitis: K21.9

## 2020-11-23 HISTORY — DX: Other complications of anesthesia, initial encounter: T88.59XA

## 2020-11-23 HISTORY — DX: Personal history of other diseases of the digestive system: Z87.19

## 2020-11-23 HISTORY — DX: Headache, unspecified: R51.9

## 2020-11-23 LAB — SARS CORONAVIRUS 2 (TAT 6-24 HRS): SARS Coronavirus 2: NEGATIVE

## 2020-11-23 LAB — GLUCOSE, CAPILLARY: Glucose-Capillary: 85 mg/dL (ref 70–99)

## 2020-11-23 MED ORDER — VANCOMYCIN HCL 1500 MG/300ML IV SOLN
1500.0000 mg | INTRAVENOUS | Status: AC
  Administered 2020-11-24: 1500 mg via INTRAVENOUS
  Filled 2020-11-23: qty 300

## 2020-11-23 NOTE — Anesthesia Preprocedure Evaluation (Addendum)
Anesthesia Evaluation  Patient identified by MRN, date of birth, ID band Patient awake    Reviewed: Allergy & Precautions, NPO status , Patient's Chart, lab work & pertinent test results  Airway Mallampati: II  TM Distance: >3 FB Neck ROM: Full    Dental no notable dental hx. (+) Teeth Intact, Dental Advisory Given   Pulmonary asthma , sleep apnea ,    Pulmonary exam normal breath sounds clear to auscultation       Cardiovascular hypertension, Pt. on medications and Pt. on home beta blockers Normal cardiovascular exam Rhythm:Regular Rate:Normal     Neuro/Psych  Headaches,    GI/Hepatic GERD  ,  Endo/Other  diabetes, Well Controlled, Type 2, Oral Hypoglycemic AgentsMorbid obesity  Renal/GU negative Renal ROS     Musculoskeletal  (+) Arthritis , Fibromyalgia -  Abdominal (+) + obese,   Peds  Hematology  (+) anemia , Lab Results      Component                Value               Date                      WBC                      11.3 (H)            10/12/2020                HGB                      11.4                10/12/2020                HCT                      36.1                10/12/2020                MCV                      85                  10/12/2020                PLT                      457 (H)             10/12/2020              Anesthesia Other Findings All: see list  Reproductive/Obstetrics                          Anesthesia Physical Anesthesia Plan  ASA: III  Anesthesia Plan: Regional   Post-op Pain Management:    Induction:   PONV Risk Score and Plan: 3 and Treatment may vary due to age or medical condition, Midazolam and Dexamethasone  Airway Management Planned: Nasal Cannula and Natural Airway  Additional Equipment: None  Intra-op Plan:   Post-operative Plan:   Informed Consent: I have reviewed the patients History and Physical, chart, labs and  discussed the procedure including the risks, benefits and alternatives for  the proposed anesthesia with the patient or authorized representative who has indicated his/her understanding and acceptance.     Dental advisory given  Plan Discussed with: CRNA and Anesthesiologist  Anesthesia Plan Comments: (R suprclavicular block)       Anesthesia Quick Evaluation

## 2020-11-24 ENCOUNTER — Ambulatory Visit (HOSPITAL_COMMUNITY)
Admission: RE | Admit: 2020-11-24 | Discharge: 2020-11-24 | Disposition: A | Discharge status: Home / Self Care | Payer: Medicare HMO | Attending: Orthopedic Surgery | Admitting: Orthopedic Surgery

## 2020-11-24 ENCOUNTER — Encounter (HOSPITAL_COMMUNITY)
Admission: RE | Disposition: A | Discharge status: Home / Self Care | Payer: Self-pay | Source: Home / Self Care | Attending: Orthopedic Surgery

## 2020-11-24 ENCOUNTER — Encounter (INDEPENDENT_AMBULATORY_CARE_PROVIDER_SITE_OTHER): Payer: Self-pay | Admitting: Family Medicine

## 2020-11-24 ENCOUNTER — Encounter (HOSPITAL_COMMUNITY): Payer: Self-pay | Admitting: Orthopedic Surgery

## 2020-11-24 ENCOUNTER — Ambulatory Visit (HOSPITAL_COMMUNITY): Payer: Medicare HMO | Admitting: Anesthesiology

## 2020-11-24 DIAGNOSIS — E119 Type 2 diabetes mellitus without complications: Secondary | ICD-10-CM | POA: Diagnosis not present

## 2020-11-24 DIAGNOSIS — Z882 Allergy status to sulfonamides status: Secondary | ICD-10-CM | POA: Diagnosis not present

## 2020-11-24 DIAGNOSIS — Z794 Long term (current) use of insulin: Secondary | ICD-10-CM | POA: Insufficient documentation

## 2020-11-24 DIAGNOSIS — W19XXXA Unspecified fall, initial encounter: Secondary | ICD-10-CM | POA: Diagnosis not present

## 2020-11-24 DIAGNOSIS — S62101A Fracture of unspecified carpal bone, right wrist, initial encounter for closed fracture: Secondary | ICD-10-CM | POA: Diagnosis not present

## 2020-11-24 DIAGNOSIS — Z7951 Long term (current) use of inhaled steroids: Secondary | ICD-10-CM | POA: Insufficient documentation

## 2020-11-24 DIAGNOSIS — Z87892 Personal history of anaphylaxis: Secondary | ICD-10-CM | POA: Insufficient documentation

## 2020-11-24 DIAGNOSIS — Z888 Allergy status to other drugs, medicaments and biological substances status: Secondary | ICD-10-CM | POA: Insufficient documentation

## 2020-11-24 DIAGNOSIS — Z885 Allergy status to narcotic agent status: Secondary | ICD-10-CM | POA: Insufficient documentation

## 2020-11-24 DIAGNOSIS — S52611A Displaced fracture of right ulna styloid process, initial encounter for closed fracture: Secondary | ICD-10-CM | POA: Diagnosis not present

## 2020-11-24 DIAGNOSIS — Z7984 Long term (current) use of oral hypoglycemic drugs: Secondary | ICD-10-CM | POA: Diagnosis not present

## 2020-11-24 DIAGNOSIS — S52501A Unspecified fracture of the lower end of right radius, initial encounter for closed fracture: Secondary | ICD-10-CM | POA: Diagnosis not present

## 2020-11-24 DIAGNOSIS — S52571A Other intraarticular fracture of lower end of right radius, initial encounter for closed fracture: Secondary | ICD-10-CM | POA: Diagnosis not present

## 2020-11-24 DIAGNOSIS — E559 Vitamin D deficiency, unspecified: Secondary | ICD-10-CM | POA: Diagnosis not present

## 2020-11-24 DIAGNOSIS — Z881 Allergy status to other antibiotic agents status: Secondary | ICD-10-CM | POA: Diagnosis not present

## 2020-11-24 DIAGNOSIS — I10 Essential (primary) hypertension: Secondary | ICD-10-CM | POA: Diagnosis not present

## 2020-11-24 DIAGNOSIS — E785 Hyperlipidemia, unspecified: Secondary | ICD-10-CM | POA: Diagnosis not present

## 2020-11-24 HISTORY — PX: ORIF WRIST FRACTURE: SHX2133

## 2020-11-24 LAB — GLUCOSE, CAPILLARY
Glucose-Capillary: 96 mg/dL (ref 70–99)
Glucose-Capillary: 94 mg/dL (ref 70–99)

## 2020-11-24 LAB — CBC
HCT: 38.7 % (ref 36.0–46.0)
Hemoglobin: 11.4 g/dL — ABNORMAL LOW (ref 12.0–15.0)
MCH: 26.7 pg (ref 26.0–34.0)
MCHC: 29.5 g/dL — ABNORMAL LOW (ref 30.0–36.0)
MCV: 90.6 fL (ref 80.0–100.0)
Platelets: 326 10*3/uL (ref 150–400)
RBC: 4.27 MIL/uL (ref 3.87–5.11)
RDW: 16.7 % — ABNORMAL HIGH (ref 11.5–15.5)
WBC: 8.9 10*3/uL (ref 4.0–10.5)
nRBC: 0 % (ref 0.0–0.2)

## 2020-11-24 SURGERY — OPEN REDUCTION INTERNAL FIXATION (ORIF) WRIST FRACTURE
Anesthesia: Regional | Site: Wrist | Laterality: Right

## 2020-11-24 MED ORDER — DEXMEDETOMIDINE (PRECEDEX) IN NS 20 MCG/5ML (4 MCG/ML) IV SYRINGE
PREFILLED_SYRINGE | INTRAVENOUS | Status: DC | PRN
Start: 2020-11-24 — End: 2020-11-24
  Administered 2020-11-24 (×3): 4 ug via INTRAVENOUS

## 2020-11-24 MED ORDER — LIDOCAINE 2% (20 MG/ML) 5 ML SYRINGE
INTRAMUSCULAR | Status: DC | PRN
  Administered 2020-11-24: 100 mg via INTRAVENOUS

## 2020-11-24 MED ORDER — ACETAMINOPHEN 500 MG PO TABS: 1000.0000 mg | ORAL_TABLET | Freq: Four times a day (QID) | ORAL | 0 refills | Status: DC | PRN

## 2020-11-24 MED ORDER — PROPOFOL 500 MG/50ML IV EMUL
INTRAVENOUS | Status: DC | PRN
  Administered 2020-11-24: 50 ug/kg/min via INTRAVENOUS

## 2020-11-24 MED ORDER — DIPHENHYDRAMINE HCL 50 MG/ML IJ SOLN
INTRAMUSCULAR | Status: DC | PRN
  Administered 2020-11-24: 12.5 mg via INTRAVENOUS

## 2020-11-24 MED ORDER — LIDOCAINE 2% (20 MG/ML) 5 ML SYRINGE
INTRAMUSCULAR | Status: AC
  Filled 2020-11-24: qty 5

## 2020-11-24 MED ORDER — LACTATED RINGERS IV SOLN: INTRAVENOUS | Status: DC | PRN

## 2020-11-24 MED ORDER — PROPOFOL 10 MG/ML IV BOLUS
INTRAVENOUS | Status: DC | PRN
  Administered 2020-11-24 (×3): 50 mg via INTRAVENOUS

## 2020-11-24 MED ORDER — PROPOFOL 500 MG/50ML IV EMUL
INTRAVENOUS | Status: AC
  Filled 2020-11-24: qty 50

## 2020-11-24 MED ORDER — LACTATED RINGERS IV SOLN: INTRAVENOUS | Status: DC

## 2020-11-24 MED ORDER — PHENYLEPHRINE HCL (PRESSORS) 10 MG/ML IV SOLN
INTRAVENOUS | Status: DC | PRN
  Administered 2020-11-24: 80 ug via INTRAVENOUS
  Administered 2020-11-24: 40 ug via INTRAVENOUS
  Administered 2020-11-24: 80 ug via INTRAVENOUS
  Administered 2020-11-24: 40 ug via INTRAVENOUS
  Administered 2020-11-24 (×2): 80 ug via INTRAVENOUS
  Administered 2020-11-24: 40 ug via INTRAVENOUS

## 2020-11-24 MED ORDER — CLONIDINE HCL (ANALGESIA) 100 MCG/ML EP SOLN
EPIDURAL | Status: DC | PRN
  Administered 2020-11-24: 100 ug

## 2020-11-24 MED ORDER — METHOCARBAMOL 500 MG PO TABS: 500.0000 mg | ORAL_TABLET | Freq: Three times a day (TID) | ORAL | 0 refills | Status: DC | PRN

## 2020-11-24 MED ORDER — MIDAZOLAM HCL 2 MG/2ML IJ SOLN
INTRAMUSCULAR | Status: DC | PRN
  Administered 2020-11-24: 2 mg via INTRAVENOUS

## 2020-11-24 MED ORDER — FENTANYL CITRATE (PF) 100 MCG/2ML IJ SOLN: 25.0000 ug | INTRAMUSCULAR | Status: DC | PRN

## 2020-11-24 MED ORDER — MELOXICAM 15 MG PO TABS: 15.0000 mg | ORAL_TABLET | Freq: Every day | ORAL | 0 refills | Status: DC

## 2020-11-24 MED ORDER — ROPIVACAINE HCL 7.5 MG/ML IJ SOLN
INTRAMUSCULAR | Status: DC | PRN
  Administered 2020-11-24: 20 mL via PERINEURAL

## 2020-11-24 MED ORDER — GLYCOPYRROLATE PF 0.2 MG/ML IJ SOSY
PREFILLED_SYRINGE | INTRAMUSCULAR | Status: AC
  Filled 2020-11-24: qty 1

## 2020-11-24 MED ORDER — ONDANSETRON HCL 4 MG/2ML IJ SOLN
INTRAMUSCULAR | Status: AC
  Filled 2020-11-24: qty 2

## 2020-11-24 MED ORDER — FENTANYL CITRATE (PF) 100 MCG/2ML IJ SOLN
INTRAMUSCULAR | Status: DC | PRN
  Administered 2020-11-24 (×2): 50 ug via INTRAVENOUS

## 2020-11-24 MED ORDER — OXYCODONE HCL 5 MG PO TABS
5.0000 mg | ORAL_TABLET | Freq: Once | ORAL | Status: DC | PRN
Start: 2020-11-24 — End: 2020-11-24

## 2020-11-24 MED ORDER — ONDANSETRON HCL 4 MG/2ML IJ SOLN
INTRAMUSCULAR | Status: DC | PRN
  Administered 2020-11-24: 4 mg via INTRAVENOUS

## 2020-11-24 MED ORDER — CHLORHEXIDINE GLUCONATE 0.12 % MT SOLN: 15.0000 mL | Freq: Once | OROMUCOSAL | Status: DC

## 2020-11-24 MED ORDER — PROMETHAZINE HCL 25 MG/ML IJ SOLN: 6.2500 mg | INTRAMUSCULAR | Status: DC | PRN

## 2020-11-24 MED ORDER — GLYCOPYRROLATE PF 0.2 MG/ML IJ SOSY
PREFILLED_SYRINGE | INTRAMUSCULAR | Status: DC | PRN
  Administered 2020-11-24: .1 mg via INTRAVENOUS

## 2020-11-24 MED ORDER — 0.9 % SODIUM CHLORIDE (POUR BTL) OPTIME
TOPICAL | Status: DC | PRN
  Administered 2020-11-24: 1000 mL

## 2020-11-24 MED ORDER — ONDANSETRON HCL 4 MG PO TABS: 4.0000 mg | ORAL_TABLET | Freq: Every day | ORAL | 0 refills | Status: DC | PRN

## 2020-11-24 MED ORDER — PROPOFOL 10 MG/ML IV BOLUS
INTRAVENOUS | Status: AC
  Filled 2020-11-24: qty 40

## 2020-11-24 MED ORDER — FENTANYL CITRATE (PF) 100 MCG/2ML IJ SOLN
INTRAMUSCULAR | Status: AC
  Filled 2020-11-24: qty 2

## 2020-11-24 MED ORDER — OXYCODONE HCL 5 MG/5ML PO SOLN: 5.0000 mg | Freq: Once | ORAL | Status: DC | PRN

## 2020-11-24 MED ORDER — DEXMEDETOMIDINE (PRECEDEX) IN NS 20 MCG/5ML (4 MCG/ML) IV SYRINGE
PREFILLED_SYRINGE | INTRAVENOUS | Status: AC
  Filled 2020-11-24: qty 5

## 2020-11-24 MED ORDER — ORAL CARE MOUTH RINSE: 15.0000 mL | Freq: Once | OROMUCOSAL | Status: DC

## 2020-11-24 MED ORDER — MIDAZOLAM HCL 2 MG/2ML IJ SOLN
INTRAMUSCULAR | Status: AC
  Filled 2020-11-24: qty 2

## 2020-11-24 MED ORDER — ALBUTEROL SULFATE HFA 108 (90 BASE) MCG/ACT IN AERS
INHALATION_SPRAY | RESPIRATORY_TRACT | Status: DC | PRN
  Administered 2020-11-24: 3 via RESPIRATORY_TRACT

## 2020-11-24 MED ORDER — ACETAMINOPHEN 500 MG PO TABS: 1000.0000 mg | ORAL_TABLET | Freq: Once | ORAL | Status: DC

## 2020-11-24 MED ORDER — ACETAMINOPHEN 10 MG/ML IV SOLN: 1000.0000 mg | Freq: Once | INTRAVENOUS | Status: DC | PRN

## 2020-11-24 MED ORDER — PHENYLEPHRINE HCL (PRESSORS) 10 MG/ML IV SOLN
INTRAVENOUS | Status: AC
  Filled 2020-11-24: qty 1

## 2020-11-24 SURGICAL SUPPLY — 44 items
BAG SPEC THK2 15X12 ZIP CLS (MISCELLANEOUS) ×1
BAG ZIPLOCK 12X15 (MISCELLANEOUS) ×2 IMPLANT
BIT DRILL 2.2 SS TIBIAL (BIT) ×1 IMPLANT
BNDG ELASTIC 4X5.8 VLCR STR LF (GAUZE/BANDAGES/DRESSINGS) ×2 IMPLANT
CLSR STERI-STRIP ANTIMIC 1/2X4 (GAUZE/BANDAGES/DRESSINGS) ×2 IMPLANT
CORD BIPOLAR FORCEPS 12FT (ELECTRODE) ×2 IMPLANT
COVER SURGICAL LIGHT HANDLE (MISCELLANEOUS) ×2 IMPLANT
COVER WAND RF STERILE (DRAPES) ×2 IMPLANT
CUFF TOURN SGL QUICK 18X4 (TOURNIQUET CUFF) ×2 IMPLANT
DECANTER SPIKE VIAL GLASS SM (MISCELLANEOUS) ×1 IMPLANT
DRAPE OEC MINIVIEW 54X84 (DRAPES) ×2 IMPLANT
DRAPE U-SHAPE 47X51 STRL (DRAPES) ×2 IMPLANT
DRSG EMULSION OIL 3X3 NADH (GAUZE/BANDAGES/DRESSINGS) ×1 IMPLANT
DRSG PAD ABDOMINAL 8X10 ST (GAUZE/BANDAGES/DRESSINGS) ×2 IMPLANT
ELECT REM PT RETURN 15FT ADLT (MISCELLANEOUS) ×2 IMPLANT
GAUZE 4X4 16PLY RFD (DISPOSABLE) ×2 IMPLANT
GLOVE SRG 8 PF TXTR STRL LF DI (GLOVE) ×1 IMPLANT
GLOVE SURG ENC MOIS LTX SZ7.5 (GLOVE) ×4 IMPLANT
GLOVE SURG UNDER POLY LF SZ7.5 (GLOVE) ×2 IMPLANT
GLOVE SURG UNDER POLY LF SZ8 (GLOVE) ×2
GOWN STRL REUS W/TWL XL LVL3 (GOWN DISPOSABLE) ×4 IMPLANT
K-WIRE 1.6 (WIRE) ×4
K-WIRE FX5X1.6XNS BN SS (WIRE) ×2
KIT BASIN OR (CUSTOM PROCEDURE TRAY) ×2 IMPLANT
KWIRE FX5X1.6XNS BN SS (WIRE) IMPLANT
MANIFOLD NEPTUNE II (INSTRUMENTS) ×2 IMPLANT
PACK ORTHO EXTREMITY (CUSTOM PROCEDURE TRAY) ×2 IMPLANT
PAD CAST 4YDX4 CTTN HI CHSV (CAST SUPPLIES) ×1 IMPLANT
PADDING CAST COTTON 4X4 STRL (CAST SUPPLIES) ×2
PEG LOCKING SMOOTH 2.2X16 (Screw) ×1 IMPLANT
PEG LOCKING SMOOTH 2.2X18 (Peg) ×6 IMPLANT
PLATE DVR CROSSLOCK STD RT (Plate) ×1 IMPLANT
PROTECTOR NERVE ULNAR (MISCELLANEOUS) ×2 IMPLANT
SCREW  LP NL 2.7X15MM (Screw) ×2 IMPLANT
SCREW 2.7X14MM (Screw) ×2 IMPLANT
SCREW BN 14X2.7XNONLOCK 3 LD (Screw) IMPLANT
SCREW LP NL 2.7X15MM (Screw) IMPLANT
SCREW NLOCK 2.7X14 (Screw) ×2 IMPLANT
SUT MNCRL AB 4-0 PS2 18 (SUTURE) ×1 IMPLANT
SUT VIC AB 2-0 CT1 27 (SUTURE) ×2
SUT VIC AB 2-0 CT1 TAPERPNT 27 (SUTURE) ×1 IMPLANT
SUT VIC AB 4-0 PS2 27 (SUTURE) ×2 IMPLANT
TOWEL OR 17X26 10 PK STRL BLUE (TOWEL DISPOSABLE) ×2 IMPLANT
UNDERPAD 30X36 HEAVY ABSORB (UNDERPADS AND DIAPERS) ×2 IMPLANT

## 2020-11-24 NOTE — Anesthesia Postprocedure Evaluation (Signed)
Anesthesia Post Note  Patient: Virginia Luna  Procedure(s) Performed: OPEN REDUCTION INTERNAL FIXATION RIGHT WRIST FRACTURE (Right Wrist)     Patient location during evaluation: PACU Anesthesia Type: Regional Level of consciousness: awake and alert Pain management: pain level controlled Vital Signs Assessment: post-procedure vital signs reviewed and stable Respiratory status: spontaneous breathing, nonlabored ventilation, respiratory function stable and patient connected to nasal cannula oxygen Cardiovascular status: stable and blood pressure returned to baseline Postop Assessment: no apparent nausea or vomiting Anesthetic complications: no   No complications documented.  Last Vitals:  Vitals:   11/24/20 0916 11/24/20 0920  BP: (!) 145/96 (!) 150/90  Pulse: 87 85  Resp: (!) 21 20  Temp:  36.6 C  SpO2: 92% 92%    Last Pain:  Vitals:   11/24/20 0920  TempSrc:   PainSc: 0-No pain                 Barnet Glasgow

## 2020-11-24 NOTE — Interval H&P Note (Signed)
History and Physical Interval Note:  11/24/2020 6:14 AM  Virginia Luna  has presented today for surgery, with the diagnosis of RIGHT WRIST FRACTURE.  The various methods of treatment have been discussed with the patient and family. After consideration of risks, benefits and other options for treatment, the patient has consented to  Procedure(s): OPEN REDUCTION INTERNAL FIXATION (ORIF) WRIST FRACTURE (Right) as a surgical intervention.  The patient's history has been reviewed, patient examined, no change in status, stable for surgery.  I have reviewed the patient's chart and labs.  Questions were answered to the patient's satisfaction.     Renette Butters

## 2020-11-24 NOTE — Anesthesia Procedure Notes (Signed)
Anesthesia Regional Block: Supraclavicular block   Pre-Anesthetic Checklist: ,, timeout performed, Correct Patient, Correct Site, Correct Laterality, Correct Procedure, Correct Position, site marked, Risks and benefits discussed,  Surgical consent,  Pre-op evaluation,  At surgeon's request and post-op pain management  Laterality: Right and Upper  Prep: chloraprep       Needles:  Injection technique: Single-shot  Needle Type: Echogenic Needle     Needle Length: 5cm  Needle Gauge: 21     Additional Needles:   Procedures:,,,, ultrasound used (permanent image in chart),,,,  Narrative:  Start time: 11/24/2020 6:55 AM End time: 11/24/2020 7:03 AM Injection made incrementally with aspirations every 5 mL.  Performed by: Personally  Anesthesiologist: Barnet Glasgow, MD

## 2020-11-24 NOTE — Op Note (Signed)
11/24/2020  8:17 AM  PATIENT:  Virginia Luna    PRE-OPERATIVE DIAGNOSIS:  RIGHT WRIST FRACTURE  POST-OPERATIVE DIAGNOSIS:  Same  PROCEDURE:  OPEN REDUCTION INTERNAL FIXATION RIGHT WRIST FRACTURE  SURGEON:  Renette Butters, MD  ASSISTANT: Aggie Moats, PA-C, he was present and scrubbed throughout the case, critical for completion in a timely fashion, and for retraction, instrumentation, and closure.   ANESTHESIA:   block  PREOPERATIVE INDICATIONS:  Virginia Luna is a  60 y.o. female with a diagnosis of RIGHT WRIST FRACTURE who failed conservative measures and elected for surgical management.    The risks benefits and alternatives were discussed with the patient preoperatively including but not limited to the risks of infection, bleeding, nerve injury, cardiopulmonary complications, the need for revision surgery, among others, and the patient was willing to proceed.  OPERATIVE IMPLANTS: DVR plate  OPERATIVE FINDINGS: unstable fx, 3+parts  BLOOD LOSS: min  COMPLICATIONS: none  TOURNIQUET TIME: 59min  OPERATIVE PROCEDURE:  Patient was identified in the preoperative holding area and site was marked by me She was transported to the operating theater and placed on the table in supine position taking care to pad all bony prominences. After a preincinduction time out anesthesia was induced. The right upper extremity was prepped and draped in normal sterile fashion and a pre-incision timeout was performed. She received vanc for preoperative antibiotics.   I made a 5 cm incision centered over her FCR tendon and dissected down carefully to the level of the flexor tendon sheath and incise this longitudinally and retracted the FCR radially and incised the dorsal aspect of the sheath.   I bluntly dissected the FPL muscle belly away from the brachioradialis and then sharply incised the pronator tendon from the distal radius and from the wrist capsule. I Elevated this off the bone the  fractures visible.   I released the brachioradialis from its insertion. I then debrided the fracture and performed a manual reduction. There were at least 3 articular pieces of the fracture.  I selected a plate and I placed it on the bone. I pinned it into place and was happy on multiple radiographic views with it's placement. I then fixed the plate distally with the locking pegs. I confirmed no articular penetration with the pegs and that none were prominent dorsally.   I then reduced the plate to the shaft improving the volar and radial tilt of her distal radius.  I was happy with the final fluoro xrays. I reviewed more than 3 views of the wrist including obliques and ap/lat   I thoroughly irrigated the wound and closed the pronator over top of the plate and then closed the skin in layers with absorbable stitch. Sterile dressing was applied using the PACU in stable condition.  POST OPERATIVE PLAN: NWB, Splint full time. Ambulate for DVT px.

## 2020-11-24 NOTE — Transfer of Care (Signed)
Immediate Anesthesia Transfer of Care Note  Patient: Virginia Luna  Procedure(s) Performed: OPEN REDUCTION INTERNAL FIXATION RIGHT WRIST FRACTURE (Right Wrist)  Patient Location: PACU  Anesthesia Type:MAC combined with regional for post-op pain  Level of Consciousness: awake, alert , oriented and patient cooperative  Airway & Oxygen Therapy: Patient Spontanous Breathing and Patient connected to nasal cannula oxygen  Post-op Assessment: Report given to RN and Post -op Vital signs reviewed and stable  Post vital signs: Reviewed and stable  Last Vitals:  Vitals Value Taken Time  BP 132/92 11/24/20 0841  Temp    Pulse 93 11/24/20 0843  Resp 23 11/24/20 0844  SpO2 100 % 11/24/20 0843  Vitals shown include unvalidated device data.  Last Pain:  Vitals:   11/24/20 0604  TempSrc:   PainSc: 0-No pain      Patients Stated Pain Goal: 3 (83/29/19 1660)  Complications: No complications documented.

## 2020-11-25 ENCOUNTER — Encounter (HOSPITAL_COMMUNITY): Payer: Self-pay | Admitting: Orthopedic Surgery

## 2020-11-30 ENCOUNTER — Other Ambulatory Visit: Payer: Self-pay | Admitting: Neurology

## 2020-11-30 ENCOUNTER — Telehealth: Payer: Self-pay | Admitting: Neurology

## 2020-11-30 ENCOUNTER — Encounter: Payer: Self-pay | Admitting: Neurology

## 2020-11-30 DIAGNOSIS — D649 Anemia, unspecified: Secondary | ICD-10-CM | POA: Diagnosis not present

## 2020-11-30 DIAGNOSIS — K219 Gastro-esophageal reflux disease without esophagitis: Secondary | ICD-10-CM | POA: Diagnosis not present

## 2020-11-30 DIAGNOSIS — J301 Allergic rhinitis due to pollen: Secondary | ICD-10-CM | POA: Diagnosis not present

## 2020-11-30 DIAGNOSIS — E7800 Pure hypercholesterolemia, unspecified: Secondary | ICD-10-CM | POA: Diagnosis not present

## 2020-11-30 DIAGNOSIS — E119 Type 2 diabetes mellitus without complications: Secondary | ICD-10-CM | POA: Diagnosis not present

## 2020-11-30 DIAGNOSIS — J3089 Other allergic rhinitis: Secondary | ICD-10-CM | POA: Diagnosis not present

## 2020-11-30 DIAGNOSIS — I10 Essential (primary) hypertension: Secondary | ICD-10-CM | POA: Diagnosis not present

## 2020-11-30 DIAGNOSIS — J3081 Allergic rhinitis due to animal (cat) (dog) hair and dander: Secondary | ICD-10-CM | POA: Diagnosis not present

## 2020-11-30 DIAGNOSIS — J4520 Mild intermittent asthma, uncomplicated: Secondary | ICD-10-CM | POA: Diagnosis not present

## 2020-11-30 MED ORDER — EMGALITY 120 MG/ML ~~LOC~~ SOAJ: 120.0000 mg | SUBCUTANEOUS | 5 refills | Status: DC

## 2020-11-30 NOTE — Telephone Encounter (Signed)
It was refilled in February with 5 additional refills, but I just refilled again to [facility]

## 2020-11-30 NOTE — Telephone Encounter (Signed)
PA on file for emgality- valid from 11/30/20 to 02/28/21.

## 2020-11-30 NOTE — Telephone Encounter (Signed)
Patient's PCP's office called in on the patient's behalf wanting to get a refill for the patient's Emgality.

## 2020-11-30 NOTE — Progress Notes (Signed)
Virginia Luna Key: [id number] - PA Case ID: [id number] - Rx #: [id number] Need help? Call us at [phone] Outcome Approvedtoday PA Case: [id number], Status: Approved, Coverage Starts on: 11/30/2020 12:00:00 AM, Coverage Ends on: 02/28/2021 12:00:00 AM. Questions? Contact [phone]. Drug Emgality 120MG /ML auto-injectors (migraine) Form [employer] Electronic PA Form Original Claim Info [id number]

## 2020-11-30 NOTE — Telephone Encounter (Signed)
Telephone call to [facility], Per rep pt was advised her insurance rejected the refill. Medication needs a PA. Will let Billing know.  LMOVM for pt, Need Pa for Medication.

## 2020-12-05 ENCOUNTER — Ambulatory Visit (INDEPENDENT_AMBULATORY_CARE_PROVIDER_SITE_OTHER): Payer: Medicare HMO | Admitting: Family Medicine

## 2020-12-05 ENCOUNTER — Encounter (INDEPENDENT_AMBULATORY_CARE_PROVIDER_SITE_OTHER): Payer: Self-pay | Admitting: Family Medicine

## 2020-12-05 ENCOUNTER — Other Ambulatory Visit: Payer: Self-pay

## 2020-12-05 VITALS — BP 138/90 | HR 97 | Temp 98.3°F | Ht 61.0 in | Wt 279.0 lb

## 2020-12-05 DIAGNOSIS — E1169 Type 2 diabetes mellitus with other specified complication: Secondary | ICD-10-CM

## 2020-12-05 DIAGNOSIS — J3081 Allergic rhinitis due to animal (cat) (dog) hair and dander: Secondary | ICD-10-CM | POA: Diagnosis not present

## 2020-12-05 DIAGNOSIS — J301 Allergic rhinitis due to pollen: Secondary | ICD-10-CM

## 2020-12-05 DIAGNOSIS — J3089 Other allergic rhinitis: Secondary | ICD-10-CM | POA: Diagnosis not present

## 2020-12-05 DIAGNOSIS — E559 Vitamin D deficiency, unspecified: Secondary | ICD-10-CM | POA: Diagnosis not present

## 2020-12-05 DIAGNOSIS — R110 Nausea: Secondary | ICD-10-CM

## 2020-12-05 DIAGNOSIS — K5909 Other constipation: Secondary | ICD-10-CM | POA: Diagnosis not present

## 2020-12-05 DIAGNOSIS — Z6841 Body Mass Index (BMI) 40.0 and over, adult: Secondary | ICD-10-CM

## 2020-12-05 DIAGNOSIS — G9389 Other specified disorders of brain: Secondary | ICD-10-CM

## 2020-12-05 DIAGNOSIS — S52501D Unspecified fracture of the lower end of right radius, subsequent encounter for closed fracture with routine healing: Secondary | ICD-10-CM | POA: Diagnosis not present

## 2020-12-05 DIAGNOSIS — E538 Deficiency of other specified B group vitamins: Secondary | ICD-10-CM

## 2020-12-05 DIAGNOSIS — Z79899 Other long term (current) drug therapy: Secondary | ICD-10-CM | POA: Diagnosis not present

## 2020-12-05 DIAGNOSIS — G43809 Other migraine, not intractable, without status migrainosus: Secondary | ICD-10-CM

## 2020-12-05 DIAGNOSIS — F5105 Insomnia due to other mental disorder: Secondary | ICD-10-CM

## 2020-12-06 MED ORDER — MONTELUKAST SODIUM 10 MG PO TABS: 10.0000 mg | ORAL_TABLET | Freq: Every day | ORAL | 3 refills | Status: DC

## 2020-12-06 MED ORDER — METHOCARBAMOL 500 MG PO TABS: 500.0000 mg | ORAL_TABLET | Freq: Three times a day (TID) | ORAL | 0 refills | Status: DC | PRN

## 2020-12-06 MED ORDER — SUMATRIPTAN SUCCINATE 100 MG PO TABS: ORAL_TABLET | ORAL | 0 refills | Status: DC

## 2020-12-06 MED ORDER — LEVOCETIRIZINE DIHYDROCHLORIDE 5 MG PO TABS
5.0000 mg | ORAL_TABLET | Freq: Every evening | ORAL | 0 refills | Status: DC
Start: 2020-12-06 — End: 2021-03-21

## 2020-12-06 MED ORDER — MELOXICAM 15 MG PO TABS: 15.0000 mg | ORAL_TABLET | Freq: Every day | ORAL | 0 refills | Status: DC

## 2020-12-06 NOTE — Progress Notes (Signed)
Chief Complaint:   OBESITY Virginia Luna is here to discuss her progress with her obesity treatment plan along with follow-up of her obesity related diagnoses.   Today's visit was #: 20 Starting weight: 288 lbs Starting date: 08/27/2019 Today's weight: 279 lbs Today's date: 12/05/2020 Total lbs lost to date: 9 lbs Body mass index is 52.72 kg/m.  Total weight loss percentage to date: -3.13%  Interim History:  Eliya says she is only taking Benadryl on the day of her injection.  She has a follow-up appointment today with Orthopedics.  She is due for a repeat MRI.  Current Meal Plan: practicing portion control and making smarter food choices, such as increasing vegetables and decreasing simple carbohydrates for 0% of the time.  Current Exercise Plan: None. Current Anti-Obesity Medications: Ozempic 0.5 mg subcutaneously weekly. Side effects: None.  Current Outpatient Medications:  .  acetaminophen (TYLENOL) 500 MG tablet, Take 2 tablets (1,000 mg total) by mouth every 6 (six) hours as needed for mild pain or moderate pain., Disp: 60 tablet, Rfl: 0 .  albuterol (PROVENTIL HFA;VENTOLIN HFA) 108 (90 BASE) MCG/ACT inhaler, Inhale 2 puffs into the lungs every 6 (six) hours as needed for wheezing or shortness of breath. , Disp: , Rfl:  .  atorvastatin (LIPITOR) 10 MG tablet, Take 10 mg by mouth at bedtime., Disp: , Rfl:  .  azelastine (ASTELIN) 0.1 % nasal spray, Place 1 spray into both nostrils 2 (two) times daily as needed for allergies., Disp: , Rfl: 2 .  EPINEPHrine 0.3 mg/0.3 mL IJ SOAJ injection, Inject 0.3 mg into the muscle as needed for anaphylaxis., Disp: , Rfl:  .  fluticasone (FLONASE) 50 MCG/ACT nasal spray, Place 1 spray into both nostrils daily as needed for allergies., Disp: , Rfl:  .  fluticasone furoate-vilanterol (BREO ELLIPTA) 200-25 MCG/INH AEPB, Inhale 1 puff daily into the lungs., Disp: , Rfl:  .  gabapentin (NEURONTIN) 300 MG capsule, Take 600 mg by mouth at bedtime., Disp:  , Rfl:  .  Galcanezumab-gnlm (EMGALITY) 120 MG/ML SOAJ, Inject 120 mg into the skin every 28 (twenty-eight) days., Disp: 1.12 mL, Rfl: 5 .  HYDROcodone-acetaminophen (NORCO/VICODIN) 5-325 MG tablet, Take 1 tablet by mouth 2 (two) times daily as needed for moderate pain. , Disp: , Rfl: 0 .  Insulin Pen Needle (BD PEN NEEDLE NANO 2ND GEN) 32G X 4 MM MISC, 1 Package by Does not apply route daily., Disp: 100 each, Rfl: 0 .  levocetirizine (XYZAL) 5 MG tablet, Take 1 tablet (5 mg total) by mouth every evening., Disp: 90 tablet, Rfl: 0 .  linaclotide (LINZESS) 72 MCG capsule, Take 1 capsule (72 mcg total) by mouth every morning. (Patient taking differently: Take 72 mcg by mouth daily as needed (constipation).), Disp: 90 capsule, Rfl: 0 .  meclizine (ANTIVERT) 25 MG tablet, TAKE ONE TABLET BY MOUTH EVERY 8 HOURS AS NEEDED FOR DIZZINESS (Patient taking differently: Take 25 mg by mouth every 8 (eight) hours as needed for dizziness.), Disp: 30 tablet, Rfl: 0 .  metoprolol succinate (TOPROL-XL) 25 MG 24 hr tablet, Take 25 mg by mouth every morning., Disp: , Rfl:  .  montelukast (SINGULAIR) 10 MG tablet, Take 1 tablet (10 mg total) by mouth at bedtime., Disp: 30 tablet, Rfl: 3 .  ondansetron (ZOFRAN ODT) 4 MG disintegrating tablet, Take 1 tablet (4 mg total) by mouth every 6 (six) hours as needed for nausea or vomiting., Disp: 20 tablet, Rfl: 0 .  pantoprazole (PROTONIX) 40 MG tablet,  Take 40 mg by mouth 2 (two) times daily., Disp: , Rfl: 1 .  QUEtiapine (SEROQUEL) 300 MG tablet, TAKE ONE TABLET BY MOUTH EVERYDAY AT BEDTIME (Patient taking differently: Take 300 mg by mouth at bedtime.), Disp: 90 tablet, Rfl: 3 .  Semaglutide,0.25 or 0.5MG /DOS, (OZEMPIC, 0.25 OR 0.5 MG/DOSE,) 2 MG/1.5ML SOPN, Inject 0.5 mg into the skin once a week., Disp: 1.5 mL, Rfl: 0 .  Vitamin D, Ergocalciferol, (DRISDOL) 1.25 MG (50000 UNIT) CAPS capsule, Take 1 capsule (50,000 Units total) by mouth every 7 (seven) days., Disp: 4 capsule, Rfl:  0 .  zolpidem (AMBIEN) 10 MG tablet, TAKE ONE TABLET BY MOUTH EVERYDAY AT BEDTIME (Patient taking differently: Take 10 mg by mouth at bedtime.), Disp: 30 tablet, Rfl: 4 .  meloxicam (MOBIC) 15 MG tablet, Take 1 tablet (15 mg total) by mouth daily. For pain and inflammation, Disp: 30 tablet, Rfl: 0 .  methocarbamol (ROBAXIN) 500 MG tablet, Take 1 tablet (500 mg total) by mouth every 8 (eight) hours as needed for muscle spasms., Disp: 20 tablet, Rfl: 0 .  SUMAtriptan (IMITREX) 100 MG tablet, TAKE ONE TABLET BY MOUTH DAILY AS NEEDED FOR MIGRAINE. MAY REPEAT IN TWO HOURS IF HEADACHE PERSISTS OR RECURS, Disp: 9 tablet, Rfl: 0  Assessment/Plan:   1. Type 2 diabetes mellitus with other specified complication, without long-term current use of insulin (HCC) Diabetes Mellitus: At goal. Medication: metformin 500 mg daily, Ozempic 0.5 mg subcutaneously weekly. Issues reviewed: blood sugar goals, complications of diabetes mellitus, hypoglycemia prevention and treatment, exercise, and nutrition.   Plan: She will continue to focus on protein-rich, low simple carbohydrate foods. We reviewed the importance of hydration, regular exercise for stress reduction, and restorative sleep.   Lab Results  Component Value Date   HGBA1C 6.0 (H) 10/12/2020   HGBA1C 6.4 (H) 02/08/2020   Lab Results  Component Value Date   LDLCALC 91 02/08/2020   CREATININE 0.61 10/12/2020   2. Insomnia due to mental condition She is taking multiple sedating medications at night to help with sleep. Discussed safety of them as well as other anticholinergic effects.   Plan: Recommend sleep hygiene measures including regular sleep schedule, optimal sleep environment, and relaxing presleep rituals.   3. Vitamin D deficiency Improving, but not optimized. Current vitamin D is 38.1, tested on 10/12/2020. Optimal goal > 50 ng/dL.  She is taking vitamin D 50,000 IU weekly.  Plan: Continue to take prescription Vitamin D @50 ,000 IU every week  as prescribed.  Follow-up for routine testing of Vitamin D, at least 2-3 times per year to avoid over-replacement.  4. Chronic constipation Alvin was informed that a decrease in bowel movement frequency is normal while losing weight, but stools should not be hard or painful.    Counseling: Getting to Good Bowel Health: Your goal is to have one soft bowel movement each day. Drink at least 8 glasses of water each day. Eat plenty of fiber (goal is over 30 grams each day). It is best to get most of your fiber from dietary sources which includes leafy green vegetables, fresh fruit, and whole grains. You may need to add fiber with the help of OTC fiber supplements. These include Metamucil, Citrucel, and Benefiber. If you are still having trouble, try adding an osmotic laxative such as Miralax. If all of these changes do not work, [employer]in [employer].   5. Nausea Chronic, being followed by PCP and Neurology.  6. Other migraine without status migrainosus, not intractable Akeria is followed  by Neurology. She takes Imitrex for rescue when she gets a migraine.   Plan:  Will refill her Imitrex today.  - Refill SUMAtriptan (IMITREX) 100 MG tablet; TAKE ONE TABLET BY MOUTH DAILY AS NEEDED FOR MIGRAINE. MAY REPEAT IN TWO HOURS IF HEADACHE PERSISTS OR RECURS  Dispense: 9 tablet; Refill: 0  7. Medication management Recent surgery. Courtesy refill for pain control today.  - Refill methocarbamol (ROBAXIN) 500 MG tablet; Take 1 tablet (500 mg total) by mouth every 8 (eight) hours as needed for muscle spasms.  Dispense: 20 tablet; Refill: 0 - Refill meloxicam (MOBIC) 15 MG tablet; Take 1 tablet (15 mg total) by mouth daily. For pain and inflammation  Dispense: 30 tablet; Refill: 0  8. Seasonal allergic rhinitis due to pollen [place] suffers from seasonal allergies.  Plan:  Will start Xyzal and Singulair, as per below.  - Start levocetirizine (XYZAL) 5 MG tablet; Take 1 tablet (5 mg total) by mouth  every evening.  Dispense: 90 tablet; Refill: 0 - Start montelukast (SINGULAIR) 10 MG tablet; Take 1 tablet (10 mg total) by mouth at bedtime.  Dispense: 30 tablet; Refill: 3  9. Brain mass Tahari was recently found to have a brain mass on MRI. She will follow-up with Neurology in 1 month for repeat surveillance.  10. Obesity, current BMI 52.7  Course: Selita is currently in the action stage of change. As such, her goal is to continue with weight loss efforts.   Nutrition goals: She has agreed to practicing portion control and making smarter food choices, such as increasing vegetables and decreasing simple carbohydrates.   Exercise goals: Increase movement as tolerated.  Behavioral modification strategies: increasing lean protein intake, decreasing simple carbohydrates, increasing vegetables and increasing water intake.  Manasvini has agreed to follow-up with our clinic in 3 weeks. She was informed of the importance of frequent follow-up visits to maximize her success with intensive lifestyle modifications for her multiple health conditions.   Objective:   Blood pressure 138/90, pulse 97, temperature 98.3 F (36.8 C), temperature source Oral, height 5\' 1"  (1.549 m), weight 279 lb (126.6 kg), SpO2 98 %. Body mass index is 52.72 kg/m.  General: Cooperative, alert, well developed, in no acute distress. HEENT: Conjunctivae and lids unremarkable. Cardiovascular: Regular rhythm.  Lungs: Normal work of breathing. Neurologic: No focal deficits.   Lab Results  Component Value Date   CREATININE 0.61 10/12/2020   BUN 15 10/12/2020   NA 143 10/12/2020   K 4.3 10/12/2020   CL 103 10/12/2020   CO2 22 10/12/2020   Lab Results  Component Value Date   ALT 9 10/12/2020   AST 9 10/12/2020   GGT 75 (H) 02/08/2020   ALKPHOS 139 (H) 10/12/2020   BILITOT 0.3 10/12/2020   Lab Results  Component Value Date   HGBA1C 6.0 (H) 10/12/2020   HGBA1C 6.4 (H) 02/08/2020   Lab Results  Component  Value Date   INSULIN 10.9 10/12/2020   INSULIN 18.5 02/08/2020   INSULIN 15.9 08/27/2019   Lab Results  Component Value Date   TSH 1.040 10/12/2020   Lab Results  Component Value Date   CHOL 176 02/08/2020   HDL 64 02/08/2020   LDLCALC 91 02/08/2020   TRIG 121 02/08/2020   Lab Results  Component Value Date   WBC 8.9 11/24/2020   HGB 11.4 (L) 11/24/2020   HCT 38.7 11/24/2020   MCV 90.6 11/24/2020   PLT 326 11/24/2020   Lab Results  Component Value Date  IRON 38 10/12/2020   TIBC 371 10/12/2020   FERRITIN 45 10/12/2020   Obesity Behavioral Intervention:   Approximately 15 minutes were spent on the discussion below.  ASK: We discussed the diagnosis of obesity with Alleah today and Alianys agreed to give [name] permission to discuss obesity behavioral modification therapy today.  ASSESS: Caroljean has the diagnosis of obesity and her BMI today is 52.9. Janique is in the action stage of change.   ADVISE: Afua was educated on the multiple health risks of obesity as well as the benefit of weight loss to improve her health. She was advised of the need for long term treatment and the importance of lifestyle modifications to improve her current health and to decrease her risk of future health problems.  AGREE: Multiple dietary modification options and treatment options were discussed and Laylah agreed to follow the recommendations documented in the above note.  ARRANGE: [place] was educated on the importance of frequent visits to treat obesity as outlined per CMS and USPSTF guidelines and agreed to schedule her next follow up appointment today.  Attestation Statements:   Reviewed by clinician on day of visit: allergies, medications, problem list, medical history, surgical history, family history, social history, and previous encounter notes.  I, Water quality scientist, CMA, am acting as transcriptionist for Briscoe Deutscher, DO  I have reviewed the above documentation for accuracy and  completeness, and I agree with the above. Briscoe Deutscher, DO

## 2020-12-09 ENCOUNTER — Encounter (INDEPENDENT_AMBULATORY_CARE_PROVIDER_SITE_OTHER): Payer: Self-pay | Admitting: Family Medicine

## 2020-12-13 ENCOUNTER — Other Ambulatory Visit (INDEPENDENT_AMBULATORY_CARE_PROVIDER_SITE_OTHER): Payer: Self-pay | Admitting: Family Medicine

## 2020-12-13 DIAGNOSIS — G43809 Other migraine, not intractable, without status migrainosus: Secondary | ICD-10-CM

## 2020-12-13 NOTE — Telephone Encounter (Signed)
Dr.Wallace °

## 2020-12-14 DIAGNOSIS — J3081 Allergic rhinitis due to animal (cat) (dog) hair and dander: Secondary | ICD-10-CM | POA: Diagnosis not present

## 2020-12-14 DIAGNOSIS — J3089 Other allergic rhinitis: Secondary | ICD-10-CM | POA: Diagnosis not present

## 2020-12-14 DIAGNOSIS — J301 Allergic rhinitis due to pollen: Secondary | ICD-10-CM | POA: Diagnosis not present

## 2020-12-15 DIAGNOSIS — J3081 Allergic rhinitis due to animal (cat) (dog) hair and dander: Secondary | ICD-10-CM | POA: Diagnosis not present

## 2020-12-15 DIAGNOSIS — J301 Allergic rhinitis due to pollen: Secondary | ICD-10-CM | POA: Diagnosis not present

## 2020-12-15 DIAGNOSIS — J3089 Other allergic rhinitis: Secondary | ICD-10-CM | POA: Diagnosis not present

## 2020-12-19 ENCOUNTER — Other Ambulatory Visit (INDEPENDENT_AMBULATORY_CARE_PROVIDER_SITE_OTHER): Payer: Self-pay | Admitting: Family Medicine

## 2020-12-19 DIAGNOSIS — Z79899 Other long term (current) drug therapy: Secondary | ICD-10-CM

## 2020-12-19 DIAGNOSIS — G43809 Other migraine, not intractable, without status migrainosus: Secondary | ICD-10-CM

## 2020-12-19 NOTE — Telephone Encounter (Signed)
Dr.Wallace °

## 2020-12-21 ENCOUNTER — Other Ambulatory Visit (INDEPENDENT_AMBULATORY_CARE_PROVIDER_SITE_OTHER): Payer: Self-pay

## 2020-12-21 DIAGNOSIS — J3081 Allergic rhinitis due to animal (cat) (dog) hair and dander: Secondary | ICD-10-CM | POA: Diagnosis not present

## 2020-12-21 DIAGNOSIS — J3089 Other allergic rhinitis: Secondary | ICD-10-CM | POA: Diagnosis not present

## 2020-12-21 DIAGNOSIS — G43809 Other migraine, not intractable, without status migrainosus: Secondary | ICD-10-CM

## 2020-12-21 DIAGNOSIS — Z79899 Other long term (current) drug therapy: Secondary | ICD-10-CM

## 2020-12-21 DIAGNOSIS — J301 Allergic rhinitis due to pollen: Secondary | ICD-10-CM | POA: Diagnosis not present

## 2020-12-21 MED ORDER — SUMATRIPTAN SUCCINATE 100 MG PO TABS: ORAL_TABLET | ORAL | 0 refills | Status: DC

## 2020-12-21 MED ORDER — MELOXICAM 15 MG PO TABS: 15.0000 mg | ORAL_TABLET | Freq: Every day | ORAL | 0 refills | Status: DC

## 2020-12-21 MED ORDER — METHOCARBAMOL 500 MG PO TABS: 500.0000 mg | ORAL_TABLET | Freq: Three times a day (TID) | ORAL | 0 refills | Status: DC | PRN

## 2020-12-21 NOTE — Telephone Encounter (Signed)
Pt needs refills on the following medications, SUMAtriptan.methocarbamol,and meloxicam. Please call these medications into her pharmacy  [facility].

## 2020-12-26 ENCOUNTER — Ambulatory Visit (INDEPENDENT_AMBULATORY_CARE_PROVIDER_SITE_OTHER): Payer: Medicare HMO | Admitting: Family Medicine

## 2020-12-26 ENCOUNTER — Encounter (INDEPENDENT_AMBULATORY_CARE_PROVIDER_SITE_OTHER): Payer: Self-pay | Admitting: Family Medicine

## 2020-12-26 ENCOUNTER — Other Ambulatory Visit: Payer: Self-pay

## 2020-12-26 VITALS — BP 132/84 | HR 98 | Temp 98.2°F | Ht 61.0 in | Wt 281.0 lb

## 2020-12-26 DIAGNOSIS — M7051 Other bursitis of knee, right knee: Secondary | ICD-10-CM | POA: Diagnosis not present

## 2020-12-26 DIAGNOSIS — R42 Dizziness and giddiness: Secondary | ICD-10-CM | POA: Diagnosis not present

## 2020-12-26 DIAGNOSIS — G43809 Other migraine, not intractable, without status migrainosus: Secondary | ICD-10-CM

## 2020-12-26 DIAGNOSIS — Z9189 Other specified personal risk factors, not elsewhere classified: Secondary | ICD-10-CM

## 2020-12-26 DIAGNOSIS — Z6841 Body Mass Index (BMI) 40.0 and over, adult: Secondary | ICD-10-CM

## 2020-12-26 DIAGNOSIS — G9389 Other specified disorders of brain: Secondary | ICD-10-CM | POA: Diagnosis not present

## 2020-12-28 ENCOUNTER — Telehealth (INDEPENDENT_AMBULATORY_CARE_PROVIDER_SITE_OTHER): Payer: Self-pay | Admitting: Family Medicine

## 2020-12-28 ENCOUNTER — Encounter: Payer: Self-pay | Admitting: Physical Therapy

## 2020-12-28 ENCOUNTER — Other Ambulatory Visit (INDEPENDENT_AMBULATORY_CARE_PROVIDER_SITE_OTHER): Payer: Self-pay | Admitting: Family Medicine

## 2020-12-28 DIAGNOSIS — G43809 Other migraine, not intractable, without status migrainosus: Secondary | ICD-10-CM

## 2020-12-28 DIAGNOSIS — E1169 Type 2 diabetes mellitus with other specified complication: Secondary | ICD-10-CM

## 2020-12-28 DIAGNOSIS — E538 Deficiency of other specified B group vitamins: Secondary | ICD-10-CM

## 2020-12-28 DIAGNOSIS — Z79899 Other long term (current) drug therapy: Secondary | ICD-10-CM

## 2020-12-28 DIAGNOSIS — E559 Vitamin D deficiency, unspecified: Secondary | ICD-10-CM

## 2020-12-28 DIAGNOSIS — K5909 Other constipation: Secondary | ICD-10-CM

## 2020-12-28 DIAGNOSIS — J3089 Other allergic rhinitis: Secondary | ICD-10-CM | POA: Diagnosis not present

## 2020-12-28 DIAGNOSIS — R110 Nausea: Secondary | ICD-10-CM

## 2020-12-28 DIAGNOSIS — J301 Allergic rhinitis due to pollen: Secondary | ICD-10-CM | POA: Diagnosis not present

## 2020-12-28 DIAGNOSIS — J3081 Allergic rhinitis due to animal (cat) (dog) hair and dander: Secondary | ICD-10-CM | POA: Diagnosis not present

## 2020-12-28 DIAGNOSIS — F5105 Insomnia due to other mental disorder: Secondary | ICD-10-CM

## 2020-12-28 NOTE — Telephone Encounter (Signed)
Ozempic needs to be called in at [facility]

## 2020-12-28 NOTE — Telephone Encounter (Signed)
Pt last seen by Dr. Wallace.  

## 2020-12-29 ENCOUNTER — Other Ambulatory Visit (INDEPENDENT_AMBULATORY_CARE_PROVIDER_SITE_OTHER): Payer: Self-pay

## 2020-12-29 DIAGNOSIS — Z79899 Other long term (current) drug therapy: Secondary | ICD-10-CM

## 2020-12-29 DIAGNOSIS — E538 Deficiency of other specified B group vitamins: Secondary | ICD-10-CM

## 2020-12-29 DIAGNOSIS — E559 Vitamin D deficiency, unspecified: Secondary | ICD-10-CM

## 2020-12-29 DIAGNOSIS — G43809 Other migraine, not intractable, without status migrainosus: Secondary | ICD-10-CM

## 2020-12-29 DIAGNOSIS — K5909 Other constipation: Secondary | ICD-10-CM

## 2020-12-29 DIAGNOSIS — F5105 Insomnia due to other mental disorder: Secondary | ICD-10-CM

## 2020-12-29 DIAGNOSIS — E1169 Type 2 diabetes mellitus with other specified complication: Secondary | ICD-10-CM

## 2020-12-29 DIAGNOSIS — R110 Nausea: Secondary | ICD-10-CM

## 2020-12-29 MED ORDER — OZEMPIC (0.25 OR 0.5 MG/DOSE) 2 MG/1.5ML ~~LOC~~ SOPN: 0.5000 mg | PEN_INJECTOR | SUBCUTANEOUS | 0 refills | Status: DC

## 2021-01-01 NOTE — Progress Notes (Signed)
Chief Complaint:   OBESITY Virginia Luna is here to discuss her progress with her obesity treatment plan along with follow-up of her obesity related diagnoses.   Today's visit was #: 21 Starting weight: 288 lbs Starting date: 08/27/2019 Total lbs lost to date: 7 lbs Total weight loss percentage to date: -2.43%  Interim History: Virginia Luna is taking Allegra at night and says she is breathing better.  She is also taking Xyzal and Singulair.  She is no longer taking Benadryl (except on days that she has allergy injections and per her Allergist) or metformin.  Nutrition Plan: practicing portion control and making smarter food choices, such as increasing vegetables and decreasing simple carbohydrates for 60% of the time. Anti-obesity medications: Ozempic 0.5 mg weekly. Reported side effects: None. Activity: None.  Assessment/Plan:   1. Brain mass She is due for repeat MRI.  She says she feels "like her brain is shaking".  Plan:  I will message Neurology to discuss repeat imaging and further follow-up. Virginia Luna is at risk for falls due to imbalance as well. She agrees to try Neuro Rehab to work on balance and eliminate any other reasons for imbalance.   2. Suprapatellar bursitis of right knee New since last fall and improving. Will follow along as it relates to her weight loss journey. Red flags reviewed. Discussed possible interventions.  3. Other migraine without status migrainosus, not intractable Virginia Luna is followed by Neurology.  She says that her migraines have been a little better. Will follow along with other Neurovestibular concerns.  4. Chronic vertigo She just restarted meclizine again due to symptoms. Will place referral to Vestibular Rehab.  - Ambulatory referral to Physical Therapy  5. At risk for activity intolerance Virginia Luna was given approximately 8 minutes of counseling today regarding her increased risk for exercise intolerance.  We discussed patient's specific personal  and medical issues that raise our concern.  She was advised of strategies to prevent injury and ways to improve her cardiopulmonary fitness levels slowly over time.    6. Obesity, current BMI 53.1  Course: Virginia Luna is currently in the action stage of change. As such, her goal is to continue with weight loss efforts.   Nutrition goals: She has agreed to practicing portion control and making smarter food choices, such as increasing vegetables and decreasing simple carbohydrates.   Exercise goals: PT.  Behavioral modification strategies: increasing lean protein intake, decreasing simple carbohydrates, increasing vegetables and increasing water intake.  Virginia Luna has agreed to follow-up with our clinic in 2 weeks. She was informed of the importance of frequent follow-up visits to maximize her success with intensive lifestyle modifications for her multiple health conditions.   Objective:   Blood pressure 132/84, pulse 98, temperature 98.2 F (36.8 C), temperature source Oral, height 5\' 1"  (1.549 m), weight 281 lb (127.5 kg), SpO2 97 %. Body mass index is 53.09 kg/m.  General: Cooperative, alert, well developed, in no acute distress. HEENT: Conjunctivae and lids unremarkable. Cardiovascular: Regular rhythm.  Lungs: Normal work of breathing. Neurologic: No focal deficits.   Lab Results  Component Value Date   CREATININE 0.61 10/12/2020   BUN 15 10/12/2020   NA 143 10/12/2020   K 4.3 10/12/2020   CL 103 10/12/2020   CO2 22 10/12/2020   Lab Results  Component Value Date   ALT 9 10/12/2020   AST 9 10/12/2020   GGT 75 (H) 02/08/2020   ALKPHOS 139 (H) 10/12/2020   BILITOT 0.3 10/12/2020   Lab Results  Component Value Date   HGBA1C 6.0 (H) 10/12/2020   HGBA1C 6.4 (H) 02/08/2020   Lab Results  Component Value Date   INSULIN 10.9 10/12/2020   INSULIN 18.5 02/08/2020   INSULIN 15.9 08/27/2019   Lab Results  Component Value Date   TSH 1.040 10/12/2020   Lab Results  Component  Value Date   CHOL 176 02/08/2020   HDL 64 02/08/2020   LDLCALC 91 02/08/2020   TRIG 121 02/08/2020   Lab Results  Component Value Date   WBC 8.9 11/24/2020   HGB 11.4 (L) 11/24/2020   HCT 38.7 11/24/2020   MCV 90.6 11/24/2020   PLT 326 11/24/2020   Lab Results  Component Value Date   IRON 38 10/12/2020   TIBC 371 10/12/2020   FERRITIN 45 10/12/2020   Obesity Behavioral Intervention:   Approximately 15 minutes were spent on the discussion below.  ASK: We discussed the diagnosis of obesity with Virginia Luna today and Virginia Luna agreed to give [name] permission to discuss obesity behavioral modification therapy today.  ASSESS: Virginia Luna has the diagnosis of obesity and her BMI today is 53.1. Virginia Luna is in the action stage of change.   ADVISE: Virginia Luna was educated on the multiple health risks of obesity as well as the benefit of weight loss to improve her health. She was advised of the need for long term treatment and the importance of lifestyle modifications to improve her current health and to decrease her risk of future health problems.  AGREE: Multiple dietary modification options and treatment options were discussed and Virginia Luna agreed to follow the recommendations documented in the above note.  ARRANGE: Virginia Luna was educated on the importance of frequent visits to treat obesity as outlined per CMS and USPSTF guidelines and agreed to schedule her next follow up appointment today.  Attestation Statements:   Reviewed by clinician on day of visit: allergies, medications, problem list, medical history, surgical history, family history, social history, and previous encounter notes.  I, Water quality scientist, CMA, am acting as transcriptionist for Briscoe Deutscher, DO  I have reviewed the above documentation for accuracy and completeness, and I agree with the above. Briscoe Deutscher, DO

## 2021-01-02 ENCOUNTER — Telehealth: Payer: Self-pay

## 2021-01-02 DIAGNOSIS — S52501D Unspecified fracture of the lower end of right radius, subsequent encounter for closed fracture with routine healing: Secondary | ICD-10-CM | POA: Diagnosis not present

## 2021-01-02 DIAGNOSIS — D496 Neoplasm of unspecified behavior of brain: Secondary | ICD-10-CM

## 2021-01-02 DIAGNOSIS — R42 Dizziness and giddiness: Secondary | ICD-10-CM

## 2021-01-02 DIAGNOSIS — G43711 Chronic migraine without aura, intractable, with status migrainosus: Secondary | ICD-10-CM

## 2021-01-02 NOTE — Telephone Encounter (Signed)
-----   Message from Pieter Partridge, DO sent at 01/02/2021  3:55 PM EDT ----- Vita Barley, I would like to order an MRI of brain with and without contrast for this woman due to dizziness and brain mass.  I have a follow up with her next month. Thanks

## 2021-01-05 ENCOUNTER — Telehealth (INDEPENDENT_AMBULATORY_CARE_PROVIDER_SITE_OTHER): Payer: Self-pay

## 2021-01-05 ENCOUNTER — Other Ambulatory Visit: Payer: Self-pay | Admitting: Radiation Therapy

## 2021-01-05 DIAGNOSIS — J301 Allergic rhinitis due to pollen: Secondary | ICD-10-CM | POA: Diagnosis not present

## 2021-01-05 DIAGNOSIS — J3081 Allergic rhinitis due to animal (cat) (dog) hair and dander: Secondary | ICD-10-CM | POA: Diagnosis not present

## 2021-01-05 DIAGNOSIS — G9389 Other specified disorders of brain: Secondary | ICD-10-CM | POA: Diagnosis not present

## 2021-01-05 DIAGNOSIS — Z6841 Body Mass Index (BMI) 40.0 and over, adult: Secondary | ICD-10-CM | POA: Diagnosis not present

## 2021-01-05 DIAGNOSIS — J3089 Other allergic rhinitis: Secondary | ICD-10-CM | POA: Diagnosis not present

## 2021-01-05 DIAGNOSIS — I10 Essential (primary) hypertension: Secondary | ICD-10-CM | POA: Diagnosis not present

## 2021-01-05 NOTE — Telephone Encounter (Signed)
Patient came into office to drop off paperwork regarding her Ozempic,please give patient a call.

## 2021-01-06 ENCOUNTER — Ambulatory Visit: Payer: Medicare HMO | Admitting: Physical Therapy

## 2021-01-06 ENCOUNTER — Other Ambulatory Visit: Payer: Self-pay

## 2021-01-06 ENCOUNTER — Encounter: Payer: Self-pay | Admitting: Physical Therapy

## 2021-01-06 DIAGNOSIS — R296 Repeated falls: Secondary | ICD-10-CM

## 2021-01-06 DIAGNOSIS — R2681 Unsteadiness on feet: Secondary | ICD-10-CM | POA: Diagnosis not present

## 2021-01-06 DIAGNOSIS — R42 Dizziness and giddiness: Secondary | ICD-10-CM

## 2021-01-06 NOTE — Patient Instructions (Signed)
Access Code: [email or web address] URL: [email or web address] Date: 01/06/2021 Prepared by: Faustino Congress  Exercises Seated Gaze Stabilization with Head Rotation - 2-3 x daily - 7 x weekly - 1 sets - 30 seconds Seated Gaze Stabilization with Head Nod - 2-3 x daily - 7 x weekly - 1 sets - 30 seconds Seated Horizontal Smooth Pursuit - 2-3 x daily - 7 x weekly - 1 sets - 10 reps Standing Gaze Stabilization with Two Near Targets and Head Rotation - 2-3 x daily - 7 x weekly - 1 sets - 10 reps

## 2021-01-06 NOTE — Therapy (Addendum)
[facility] [address] [place], [place], [address] Phone: [phone]   Fax:  [phone]  Physical Therapy Evaluation  Patient Details  Name: Virginia Luna MRN: [id number] Date of Birth: 07/23/1961 Referring Provider (PT): Briscoe Deutscher, DO   Referring diagnosis? R42 Treatment diagnosis? (if different than referring diagnosis) R42, R26.81, R29.6 What was this (referring dx) caused by? []  Surgery [x]  Fall [x]  Ongoing issue []  Arthritis []  Other: ____________  Laterality: []  Rt []  Lt [x]  Both  Check all possible CPT codes:      [x]  97110 (Therapeutic Exercise)  []  92507 (SLP Treatment)  [x]  97112 (Neuro Re-ed)   []  92526 (Swallowing Treatment)   [x]  97116 (Gait Training)   []  [id number] (Cognitive Training, 1st 15 minutes) []  97140 (Manual Therapy)   []  97130 (Cognitive Training, each add'l 15 minutes)  [x]  97530 (Therapeutic Activities)  [x]  Other, List CPT Code _Canalith Repositioning (95992)_    [x]  97535 (Self Care)       []  All codes above (97110 - 97535)  []  97012 (Mechanical Traction)  [x]  97014 (E-stim Unattended)  []  97032 (E-stim manual)  []  97033 (Ionto)  []  97035 (Ultrasound)  []  97760 (Orthotic Fit) []  97750 (Physical Performance Training) []  [id number] (Aquatic Therapy) []  97034 (Contrast Bath) []  [id number] (Paraffin) []  97597 (Wound Care 1st 20 sq cm) []  97598 (Wound Care each add'l 20 sq cm) []  97016 (Vasopneumatic Device) []  [phone] Comptroller) []  [id number] (Prosthetic Training)    Encounter Date: 01/06/2021   PT End of Session - 01/06/21 1006    Visit Number 1    Number of Visits 6    Date for PT Re-Evaluation 02/17/21    Authorization Type [employer]    Progress Note Due on Visit 10    PT Start Time 0915    PT Stop Time 1000    PT Time Calculation (min) 45 min    Activity Tolerance Patient tolerated treatment well    Behavior During Therapy WFL for tasks assessed/performed           Past Medical History:   Diagnosis Date  . Allergies   . Anemia   . Arthritis   . Asthma   . Back pain   . Chronic pain   . Complication of anesthesia    woke up during colonoscopy  . Diabetes ([place])   . Diabetes mellitus without complication ([place])   . Edema, lower extremity   . Fibroid   . Fibromyalgia    Chronic  . GERD (gastroesophageal reflux disease)   . Headache    migraines  . High blood pressure   . History of hiatal hernia   . History of stomach ulcers   . IBS (irritable bowel syndrome)   . Joint pain   . Sleep apnea    did use cpap-lost 25lb-says she does not need it   NO CPAP  . Wears contact lenses     Past Surgical History:  Procedure Laterality Date  . BREAST BIOPSY    . BREAST EXCISIONAL BIOPSY    . BREAST LUMPECTOMY WITH RADIOACTIVE SEED LOCALIZATION Bilateral 11/24/2014   Procedure: BILATERAL BREAST LUMPECTOMY WITH RADIOACTIVE SEED LOCALIZATION;  Surgeon: [name], MD;  Location: [facility];  Service: General;  Laterality: Bilateral;  . CHOLECYSTECTOMY    . COLONOSCOPY    . DILATION AND CURETTAGE OF UTERUS    . ESOPHAGOGASTRODUODENOSCOPY (EGD) WITH PROPOFOL N/A 07/23/2016   Procedure: ESOPHAGOGASTRODUODENOSCOPY (EGD) WITH PROPOFOL;  Surgeon: Laurence Spates, MD;  Location: [facility];  Service: Endoscopy;  Laterality: N/A;  . ESOPHAGOGASTRODUODENOSCOPY (EGD) WITH PROPOFOL N/A 06/11/2018   Procedure: ESOPHAGOGASTRODUODENOSCOPY (EGD) WITH PROPOFOL;  Surgeon: Laurence Spates, MD;  Location: [facility];  Service: Endoscopy;  Laterality: N/A;  . LUMBAR LAMINECTOMY  2010  . ORIF WRIST FRACTURE Right 11/24/2020   Procedure: OPEN REDUCTION INTERNAL FIXATION RIGHT WRIST FRACTURE;  Surgeon: Renette Butters, MD;  Location: [facility];  Service: Orthopedics;  Laterality: Right;  . UMBILICAL HERNIA REPAIR     age 60  . UPPER GI ENDOSCOPY      There were no vitals filed for this visit.    Subjective Assessment - 01/06/21 0917    Subjective Pt is a 60 y/o female who  presents to OPPT for vertigo x a little over 1 year.  She also reports hx of falls but feels "I just pass out."  She has a recently diagnosed brain mass, which at this time she has not had surgery, but neurosurgeon is discussing case with other MDs.    Patient Stated Goals improve vertigo    Currently in Pain? Yes    Pain Location Generalized   knees, neck, chronic fibromyalgia   Pain Orientation Right;Left    Pain Descriptors / Indicators Aching;Dull    Pain Type Acute pain;Chronic pain    Pain Onset More than a month ago    Pain Frequency Constant    Pain Relieving Factors medication, muscle relaxers    Effect of Pain on Daily Activities will monitor but will not directly address              [facility] PT Assessment - 01/06/21 0923      Assessment   Medical Diagnosis R42 (ICD-10-CM) - Chronic vertigo    Referring Provider (PT) Briscoe Deutscher, DO    Onset Date/Surgical Date --   1 year ago   Hand Dominance Right    Next MD Visit 01/09/21    Prior Therapy not for vertigo - for other conditions      Precautions   Precautions Fall      Restrictions   Weight Bearing Restrictions No      Balance Screen   Has the patient fallen in the past 6 months Yes    How many times? 1    Has the patient had a decrease in activity level because of a fear of falling?  Yes    Is the patient reluctant to leave their home because of a fear of falling?  Yes      [place] Private residence    Living Arrangements Spouse/significant other    Type of [place] to enter    Entrance Stairs-Number of Steps 3   hand on brick   Entrance Stairs-Rails None    Home Layout One level      Prior Function   Level of Independence Independent    Vocation On disability    Leisure doesn't do much; not allowed to exercise at this time      Cognition   Overall Cognitive Status Within Functional Limits for tasks assessed      Posture/Postural Control    Posture/Postural Control Postural limitations    Postural Limitations Rounded Shoulders;Forward head                  Vestibular Assessment - 01/06/21 0928      Vestibular Assessment   General Observation rates symptoms at rest  3/10; reports seeing spots at 4/10      Symptom Behavior   Subjective history of current problem prior episodes of vertigo - trauma triggers vertigo    Type of Dizziness  "World moves"   "earth is shaking"   Frequency of Dizziness weekly    Duration of Dizziness up to 24 hours    Symptom Nature Positional;Motion provoked   leaning down   Aggravating Factors Forward bending;Activity in general;Turning head quickly    Relieving Factors Medication;Rest;Closing eyes   cold   Progression of Symptoms No change since onset    History of similar episodes see subjective      Oculomotor Exam   Oculomotor Alignment Normal    Spontaneous Absent    Gaze-induced  Absent    Smooth Pursuits Intact   with symptoms 3.5/10   Saccades Intact   symptoms Lt>Rt     Oculomotor Exam-Fixation Suppressed    Left Head Impulse (-)    Right Head Impulse (+) with catch up saccade      Vestibulo-Ocular Reflex   VOR 1 Head Only (x 1 viewing) intact with increase in symptoms      Positional Testing   Sidelying Test Sidelying Right;Sidelying Left    Horizontal Canal Testing Horizontal Canal Right;Horizontal Canal Left      Sidelying Right   Sidelying Right Duration none    Sidelying Right Symptoms No nystagmus      Sidelying Left   Sidelying Left Duration none    Sidelying Left Symptoms No nystagmus      Horizontal Canal Right   Horizontal Canal Right Duration none    Horizontal Canal Right Symptoms Normal      Horizontal Canal Left   Horizontal Canal Left Duration none    Horizontal Canal Left Symptoms Normal              Objective measurements completed on examination: See above findings.        Vestibular Treatment/Exercise - 01/06/21 1003       Vestibular Treatment/Exercise   Vestibular Treatment Provided Gaze    Gaze Exercises X1 Viewing Horizontal;Eye/Head Exercise Horizontal;X1 Viewing Vertical      X1 Viewing Horizontal   Foot Position seated    Comments reviewed with pt - performed x 10 sec      X1 Viewing Vertical   Foot Position seated    Comments reviewed with pt - performed x 15 sec      Eye/Head Exercise Horizontal   Comments smooth pursuits horizontal/vertical x 10 reps each and corrective saccades x 3 reps horizontal - min cues for technique                 PT Education - 01/06/21 1006    Education Details HEP    Person(s) Educated Patient    Methods Explanation;Demonstration;Handout    Comprehension Verbalized understanding;Returned demonstration;Need further instruction            PT Short Term Goals - 01/06/21 1011      PT SHORT TERM GOAL #1   Title Patient will be indepenent with basic HEP    Time 3    Period Weeks    Status New    Target Date 01/27/21      PT SHORT TERM GOAL #2   Title n/a      PT SHORT TERM GOAL #3   Title n/a             PT Long Term Goals - 01/06/21 1021  PT LONG TERM GOAL #1   Title independent with final HEP    Time 6    Period Weeks    Status New    Target Date 02/17/21      PT LONG TERM GOAL #2   Title report 50% improvement in symptoms for improved function    Time 6    Period Weeks    Status New    Target Date 02/17/21      PT LONG TERM GOAL #3   Title perform smooth pursuits without increase in symptoms for improved function    Time 6    Period Weeks    Status New    Target Date 02/17/21      PT LONG TERM GOAL #4   Title report no falls x 3 weeks for improved balance and mobility    Time 6    Period Weeks    Status New    Target Date 02/17/21                  Plan - 01/06/21 1006    Clinical Impression Statement Pt is a 60 y/o female who presents to OPPT for chronic vertigo and hx of falls.  She has an extensive  medical history with recent brain mass identified and feel symptoms likely multifactorial.  Positional testing negative for BPPV and pt symptomatic with head/eye motions so will address with gaze and habituation at this time.  Will benefit from PT to address deficits listed.    Personal Factors and Comorbidities Past/Current Experience;Comorbidity 3+;Time since onset of injury/illness/exacerbation    Comorbidities arthritis, asthma, chronic pain, DM, fibromyalgia, HTN, IBS, sleep apnea, brain mass    Examination-Activity Limitations Bed Mobility;Sleep;Bend;Stairs;Stand;Locomotion Level;Transfers    Examination-Participation Restrictions Community Activity;Shop;Meal Prep;Laundry    Stability/Clinical Decision Making Unstable/Unpredictable    Clinical Decision Making High    Rehab Potential Fair    PT Frequency 1x / week    PT Duration 6 weeks    PT Treatment/Interventions ADLs/Self Care Home Management;Canalith Repostioning;Electrical Stimulation;Moist Heat;Balance training;Therapeutic exercise;Therapeutic activities;Functional mobility training;Stair training;Gait training;Neuromuscular re-education;Patient/family education;Manual techniques;Taping;Energy conservation    PT Next Visit Plan review HEP, progress as able, standing balance exercises    PT Home Exercise Plan Access Code: [email or web address]    Consulted and Agree with Plan of Care Patient           Patient will benefit from skilled therapeutic intervention in order to improve the following deficits and impairments:  Decreased balance,Decreased mobility,Difficulty walking,Dizziness,Postural dysfunction  Visit Diagnosis: Dizziness and giddiness - Plan: PT plan of care cert/re-cert  Unsteadiness on feet - Plan: PT plan of care cert/re-cert  Repeated falls - Plan: PT plan of care cert/re-cert     Problem List Patient Active Problem List   Diagnosis Date Noted  . Allergic rhinitis 10/06/2020  . Allergic rhinitis due to animal (cat)  (dog) hair and dander 10/06/2020  . Allergic rhinitis due to pollen 10/06/2020  . Moderate persistent asthma, uncomplicated [id number]  . Paresthesia 05/27/2020  . Diabetes mellitus ([place]) 05/25/2020  . Hypertension associated with diabetes ([place]) 05/25/2020  . Hyperlipidemia associated with type 2 diabetes mellitus ([place]) 05/25/2020  . Vitamin D deficiency 05/25/2020  . NAFLD (nonalcoholic fatty liver disease) 05/25/2020  . Sciatica of left side 05/25/2020     Laureen Abrahams, PT, DPT 01/06/21 10:34 AM  [facility] [address] [place], [place], [address] Phone: [phone]   Fax:  [phone]  Name: TISHAWNA KIMBRELL MRN: [id number]  Date of Birth: 1961-02-15

## 2021-01-09 ENCOUNTER — Other Ambulatory Visit: Payer: Self-pay

## 2021-01-09 ENCOUNTER — Ambulatory Visit (INDEPENDENT_AMBULATORY_CARE_PROVIDER_SITE_OTHER): Payer: Medicare HMO | Admitting: Family Medicine

## 2021-01-09 ENCOUNTER — Encounter (INDEPENDENT_AMBULATORY_CARE_PROVIDER_SITE_OTHER): Payer: Self-pay | Admitting: Family Medicine

## 2021-01-09 ENCOUNTER — Inpatient Hospital Stay: Payer: Medicare HMO | Attending: Neurological Surgery

## 2021-01-09 VITALS — BP 135/84 | HR 95 | Temp 98.1°F | Ht 61.0 in | Wt 284.0 lb

## 2021-01-09 DIAGNOSIS — R110 Nausea: Secondary | ICD-10-CM

## 2021-01-09 DIAGNOSIS — Z79899 Other long term (current) drug therapy: Secondary | ICD-10-CM

## 2021-01-09 DIAGNOSIS — R42 Dizziness and giddiness: Secondary | ICD-10-CM

## 2021-01-09 DIAGNOSIS — Z6841 Body Mass Index (BMI) 40.0 and over, adult: Secondary | ICD-10-CM | POA: Diagnosis not present

## 2021-01-09 DIAGNOSIS — G9389 Other specified disorders of brain: Secondary | ICD-10-CM | POA: Diagnosis not present

## 2021-01-09 DIAGNOSIS — E1169 Type 2 diabetes mellitus with other specified complication: Secondary | ICD-10-CM

## 2021-01-09 MED ORDER — MELOXICAM 15 MG PO TABS: 15.0000 mg | ORAL_TABLET | Freq: Every day | ORAL | 0 refills | Status: AC

## 2021-01-09 MED ORDER — ONDANSETRON 4 MG PO TBDP: 4.0000 mg | ORAL_TABLET | Freq: Four times a day (QID) | ORAL | 0 refills | Status: DC | PRN

## 2021-01-09 NOTE — Progress Notes (Signed)
Chief Complaint:   OBESITY Virginia Luna is here to discuss her progress with her obesity treatment plan along with follow-up of her obesity related diagnoses.   Today's visit was #: 22 Starting weight: 288 lbs Starting date: 08/27/2019 Total lbs lost to date: 4 lbs Total weight loss percentage to date: -1.39%  Interim History: Virginia Luna has labyrinthitis. She is finding benefit from working with Neuro PT. She sees her PCP, Dr. Kenton Kingfisher on the 14th. She will see Dr. Percell Miller for her right knee in 6 weeks.  Her allergies are more controlled on her current regimen. She will be having repeat imaging re: her brain mass.  Nutrition Plan: practicing portion control and making smarter food choices, such as increasing vegetables and decreasing simple carbohydrates.for 100% of the time. Anti-obesity medications: Ozempic 0.5 mg weekly. Reported side effects: None. Activity: None.  Assessment/Plan:   1. Medication management Pain is moderately controlled. Will refill meloxicam 15 mg daily today, as per below. We will continue to monitor symptoms as they relate to her weight loss journey.  - Refill meloxicam (MOBIC) 15 MG tablet; Take 1 tablet (15 mg total) by mouth daily. For pain and inflammation  Dispense: 30 tablet; Refill: 0  2. Nausea Chronic, being followed by PCP and Neurology.  - Refill ondansetron (ZOFRAN ODT) 4 MG disintegrating tablet; Take 1 tablet (4 mg total) by mouth every 6 (six) hours as needed for nausea or vomiting.  Dispense: 20 tablet; Refill: 0  3. Brain mass Virginia Luna will continue to follow with Neurology/Neurosurgery. New imaging scheduled. If intervention needed, Virginia Luna will be sent to [facility], per her report. We will continue to work on Neuro PT to maintain balance and strength.   4. Chronic vertigo She just restarted meclizine again due to symptoms. We will continue to monitor symptoms as they relate to her weight loss journey.  5. Type 2 diabetes mellitus with other  specified complication, without long-term current use of insulin (HCC) Diabetes Mellitus: Improving, but not optimized. Medication: Ozempic 0.5 mg subcutaneously weekly. Issues reviewed: blood sugar goals, complications of diabetes mellitus, hypoglycemia prevention and treatment, exercise, and nutrition.   Plan: The patient will continue to focus on protein-rich, low simple carbohydrate foods. We reviewed the importance of hydration, regular exercise for stress reduction, and restorative sleep.   Lab Results  Component Value Date   HGBA1C 6.0 (H) 10/12/2020   HGBA1C 6.4 (H) 02/08/2020   Lab Results  Component Value Date   LDLCALC 91 02/08/2020   CREATININE 0.61 10/12/2020   6. Obesity, current BMI 53.7  Course: Virginia Luna is currently in the action stage of change. As such, her goal is to continue with weight loss efforts.   Nutrition goals: She has agreed to practicing portion control and making smarter food choices, such as increasing vegetables and decreasing simple carbohydrates.   Exercise goals: PT.  Behavioral modification strategies: increasing lean protein intake, decreasing simple carbohydrates, increasing vegetables and increasing water intake.  Virginia Luna has agreed to follow-up with our clinic in 3 weeks. She was informed of the importance of frequent follow-up visits to maximize her success with intensive lifestyle modifications for her multiple health conditions.   Objective:   Blood pressure 135/84, pulse 95, temperature 98.1 F (36.7 C), temperature source Oral, height 5\' 1"  (1.549 m), weight 284 lb (128.8 kg), SpO2 95 %. Body mass index is 53.66 kg/m.  General: Cooperative, alert, well developed, in no acute distress. HEENT: Conjunctivae and lids unremarkable. Cardiovascular: Regular rhythm.  Lungs: Normal  work of breathing. Neurologic: No focal deficits.   Lab Results  Component Value Date   CREATININE 0.61 10/12/2020   BUN 15 10/12/2020   NA 143 10/12/2020    K 4.3 10/12/2020   CL 103 10/12/2020   CO2 22 10/12/2020   Lab Results  Component Value Date   ALT 9 10/12/2020   AST 9 10/12/2020   GGT 75 (H) 02/08/2020   ALKPHOS 139 (H) 10/12/2020   BILITOT 0.3 10/12/2020   Lab Results  Component Value Date   HGBA1C 6.0 (H) 10/12/2020   HGBA1C 6.4 (H) 02/08/2020   Lab Results  Component Value Date   INSULIN 10.9 10/12/2020   INSULIN 18.5 02/08/2020   INSULIN 15.9 08/27/2019   Lab Results  Component Value Date   TSH 1.040 10/12/2020   Lab Results  Component Value Date   CHOL 176 02/08/2020   HDL 64 02/08/2020   LDLCALC 91 02/08/2020   TRIG 121 02/08/2020   Lab Results  Component Value Date   WBC 8.9 11/24/2020   HGB 11.4 (L) 11/24/2020   HCT 38.7 11/24/2020   MCV 90.6 11/24/2020   PLT 326 11/24/2020   Lab Results  Component Value Date   IRON 38 10/12/2020   TIBC 371 10/12/2020   FERRITIN 45 10/12/2020   Obesity Behavioral Intervention:   Approximately 15 minutes were spent on the discussion below.  ASK: We discussed the diagnosis of obesity with Virginia Luna today and Virginia Luna agreed to give [name] permission to discuss obesity behavioral modification therapy today.  ASSESS: Virginia Luna has the diagnosis of obesity and her BMI today is 53.7. Virginia Luna is in the action stage of change.   ADVISE: Virginia Luna was educated on the multiple health risks of obesity as well as the benefit of weight loss to improve her health. She was advised of the need for long term treatment and the importance of lifestyle modifications to improve her current health and to decrease her risk of future health problems.  AGREE: Multiple dietary modification options and treatment options were discussed and Virginia Luna agreed to follow the recommendations documented in the above note.  ARRANGE: Virginia Luna was educated on the importance of frequent visits to treat obesity as outlined per CMS and USPSTF guidelines and agreed to schedule her next follow up appointment  today.  Attestation Statements:   Reviewed by clinician on day of visit: allergies, medications, problem list, medical history, surgical history, family history, social history, and previous encounter notes.  I, Water quality scientist, CMA, am acting as transcriptionist for Briscoe Deutscher, DO  I have reviewed the above documentation for accuracy and completeness, and I agree with the above. Briscoe Deutscher, DO

## 2021-01-11 DIAGNOSIS — J301 Allergic rhinitis due to pollen: Secondary | ICD-10-CM | POA: Diagnosis not present

## 2021-01-11 DIAGNOSIS — J3089 Other allergic rhinitis: Secondary | ICD-10-CM | POA: Diagnosis not present

## 2021-01-12 ENCOUNTER — Other Ambulatory Visit: Payer: Self-pay | Admitting: Neurological Surgery

## 2021-01-12 ENCOUNTER — Other Ambulatory Visit: Payer: Self-pay | Admitting: *Deleted

## 2021-01-12 DIAGNOSIS — E7800 Pure hypercholesterolemia, unspecified: Secondary | ICD-10-CM | POA: Diagnosis not present

## 2021-01-12 DIAGNOSIS — D649 Anemia, unspecified: Secondary | ICD-10-CM | POA: Diagnosis not present

## 2021-01-12 DIAGNOSIS — I10 Essential (primary) hypertension: Secondary | ICD-10-CM | POA: Diagnosis not present

## 2021-01-12 DIAGNOSIS — E119 Type 2 diabetes mellitus without complications: Secondary | ICD-10-CM | POA: Diagnosis not present

## 2021-01-12 DIAGNOSIS — G9389 Other specified disorders of brain: Secondary | ICD-10-CM

## 2021-01-12 DIAGNOSIS — K219 Gastro-esophageal reflux disease without esophagitis: Secondary | ICD-10-CM | POA: Diagnosis not present

## 2021-01-12 DIAGNOSIS — J4520 Mild intermittent asthma, uncomplicated: Secondary | ICD-10-CM | POA: Diagnosis not present

## 2021-01-13 ENCOUNTER — Other Ambulatory Visit: Payer: Self-pay

## 2021-01-13 ENCOUNTER — Encounter: Payer: Self-pay | Admitting: Physical Therapy

## 2021-01-13 ENCOUNTER — Ambulatory Visit: Payer: Medicare HMO | Admitting: Physical Therapy

## 2021-01-13 VITALS — BP 135/91 | HR 93

## 2021-01-13 DIAGNOSIS — I10 Essential (primary) hypertension: Secondary | ICD-10-CM | POA: Diagnosis not present

## 2021-01-13 DIAGNOSIS — R42 Dizziness and giddiness: Secondary | ICD-10-CM

## 2021-01-13 DIAGNOSIS — R2681 Unsteadiness on feet: Secondary | ICD-10-CM

## 2021-01-13 DIAGNOSIS — R296 Repeated falls: Secondary | ICD-10-CM

## 2021-01-13 NOTE — Patient Instructions (Signed)
Access Code: [email or web address] URL: [email or web address] Date: 01/13/2021 Prepared by: Faustino Congress  Exercises Seated Gaze Stabilization with Head Rotation - 2-3 x daily - 7 x weekly - 1 sets - 30 seconds Seated Gaze Stabilization with Head Nod - 2-3 x daily - 7 x weekly - 1 sets - 30 seconds Seated Horizontal Smooth Pursuit - 2-3 x daily - 7 x weekly - 1 sets - 10 reps Standing Gaze Stabilization with Two Near Targets and Head Rotation - 2-3 x daily - 7 x weekly - 1 sets - 10 reps Romberg Stance with Eyes Closed - 2-3 x daily - 7 x weekly - 1 sets - 1 reps

## 2021-01-13 NOTE — Therapy (Signed)
[facility] Physical Therapy [address] [place], [place], [address] Phone: [phone]   Fax:  [phone]  Physical Therapy Treatment  Patient Details  Name: Virginia Luna MRN: [id number] Date of Birth: 08/31/1960 Referring Provider (PT): Briscoe Deutscher, DO   Encounter Date: 01/13/2021   PT End of Session - 01/13/21 1143    Visit Number 2    Number of Visits 6    Date for PT Re-Evaluation 02/17/21    Authorization Type [employer], approved 12 visits through 03/08/21    Authorization - Visit Number 2    Authorization - Number of Visits 12    Progress Note Due on Visit 10    PT Start Time 1058    PT Stop Time 1137    PT Time Calculation (min) 39 min    Activity Tolerance Patient tolerated treatment well    Behavior During Therapy [facility] for tasks assessed/performed           Past Medical History:  Diagnosis Date  . Allergies   . Anemia   . Arthritis   . Asthma   . Back pain   . Chronic pain   . Complication of anesthesia    woke up during colonoscopy  . Diabetes ([place])   . Diabetes mellitus without complication ([place])   . Edema, lower extremity   . Fibroid   . Fibromyalgia    Chronic  . GERD (gastroesophageal reflux disease)   . Headache    migraines  . High blood pressure   . History of hiatal hernia   . History of stomach ulcers   . IBS (irritable bowel syndrome)   . Joint pain   . Sleep apnea    did use cpap-lost 25lb-says she does not need it   NO CPAP  . Wears contact lenses     Past Surgical History:  Procedure Laterality Date  . BREAST BIOPSY    . BREAST EXCISIONAL BIOPSY    . BREAST LUMPECTOMY WITH RADIOACTIVE SEED LOCALIZATION Bilateral 11/24/2014   Procedure: BILATERAL BREAST LUMPECTOMY WITH RADIOACTIVE SEED LOCALIZATION;  Surgeon: [name], MD;  Location: [place];  Service: General;  Laterality: Bilateral;  . CHOLECYSTECTOMY    . COLONOSCOPY    . DILATION AND CURETTAGE OF UTERUS    .  ESOPHAGOGASTRODUODENOSCOPY (EGD) WITH PROPOFOL N/A 07/23/2016   Procedure: ESOPHAGOGASTRODUODENOSCOPY (EGD) WITH PROPOFOL;  Surgeon: Laurence Spates, MD;  [facility];  Service: Endoscopy;  Laterality: N/A;  . ESOPHAGOGASTRODUODENOSCOPY (EGD) WITH PROPOFOL N/A 06/11/2018   Procedure: ESOPHAGOGASTRODUODENOSCOPY (EGD) WITH PROPOFOL;  Surgeon: Laurence Spates, MD;  [facility];  Service: Endoscopy;  Laterality: N/A;  . LUMBAR LAMINECTOMY  2010  . ORIF WRIST FRACTURE Right 11/24/2020   Procedure: OPEN REDUCTION INTERNAL FIXATION RIGHT WRIST FRACTURE;  Surgeon: Renette Butters, MD;  Location: [facility];  Service: Orthopedics;  Laterality: Right;  . UMBILICAL HERNIA REPAIR     age 60  . UPPER GI ENDOSCOPY      Vitals:   01/13/21 1109  BP: (!) 135/91  Pulse: 93  SpO2: 98%     Subjective Assessment - 01/13/21 1100    Subjective dizziness is "not quite as bad as last week."    Patient Stated Goals improve vertigo    Currently in Pain? Yes    Pain Score 7     Pain Location Generalized    Pain Onset More than a month ago  Vestibular Treatment/Exercise - 01/13/21 1106      Vestibular Treatment/Exercise   Vestibular Treatment Provided Gaze    Gaze Exercises X1 Viewing Horizontal;Eye/Head Exercise Horizontal;X1 Viewing Vertical      X1 Viewing Horizontal   Foot Position seated    Time --   30 sec   Comments increase in nausea      X1 Viewing Vertical   Foot Position seated    Time --   30 sec   Comments mild nausea      Eye/Head Exercise Horizontal   Comments smooth pursuits horizontal/vertical x 10 reps each and corrective saccades x 10 reps horizontal - min cues for technique with saccades; no symptoms with smooth pursuits today              Balance Exercises - 01/13/21 1134      Balance Exercises: Standing   Standing Eyes Opened Foam/compliant surface;Wide (BOA);Head turns    Standing Eyes Closed Narrow base  of support (BOS);Solid surface;Head turns             PT Education - 01/13/21 1143    Education Details added single corner balance exercise today    Person(s) Educated Patient    Methods Explanation;Demonstration;Handout    Comprehension Verbalized understanding;Returned demonstration;Need further instruction            PT Short Term Goals - 01/13/21 1144      PT SHORT TERM GOAL #1   Title Patient will be indepenent with basic HEP    Time 3    Period Weeks    Status On-going    Target Date 01/27/21      PT SHORT TERM GOAL #2   Title n/a      PT SHORT TERM GOAL #3   Title n/a             PT Long Term Goals - 01/06/21 1021      PT LONG TERM GOAL #1   Title independent with final HEP    Time 6    Period Weeks    Status New    Target Date 02/17/21      PT LONG TERM GOAL #2   Title report 50% improvement in symptoms for improved function    Time 6    Period Weeks    Status New    Target Date 02/17/21      PT LONG TERM GOAL #3   Title perform smooth pursuits without increase in symptoms for improved function    Time 6    Period Weeks    Status New    Target Date 02/17/21      PT LONG TERM GOAL #4   Title report no falls x 3 weeks for improved balance and mobility    Time 6    Period Weeks    Status New    Target Date 02/17/21                 Plan - 01/13/21 1144    Clinical Impression Statement Pt needed min cues with HEP review today, and seated rest breaks to bring symptoms back to baseline with HEP review.  Will continue to benefit from PT to maximize function.  Smooth pursuits today did not provoke symptoms so recommended pt can d/c this exercise if it is not symptoms provoking for at least 2 days in a row.  No goals met as only 2nd visit at this time.    Personal Factors and Comorbidities Past/Current  Experience;Comorbidity 3+;Time since onset of injury/illness/exacerbation    Comorbidities arthritis, asthma, chronic pain, DM,  fibromyalgia, HTN, IBS, sleep apnea, brain mass    Examination-Activity Limitations Bed Mobility;Sleep;Bend;Stairs;Stand;Locomotion Level;Transfers    Examination-Participation Restrictions Community Activity;Shop;Meal Prep;Laundry    Stability/Clinical Decision Making Unstable/Unpredictable    Rehab Potential Fair    PT Frequency 1x / week    PT Duration 6 weeks    PT Treatment/Interventions ADLs/Self Care Home Management;Canalith Repostioning;Electrical Stimulation;Moist Heat;Balance training;Therapeutic exercise;Therapeutic activities;Functional mobility training;Stair training;Gait training;Neuromuscular re-education;Patient/family education;Manual techniques;Taping;Energy conservation    PT Next Visit Plan review corner balance exercise and progress as able, gaze standing if able to progress, standing balance exercises    PT Home Exercise Plan Access Code: [email or web address]    Consulted and Agree with Plan of Care Patient           Patient will benefit from skilled therapeutic intervention in order to improve the following deficits and impairments:  Decreased balance,Decreased mobility,Difficulty walking,Dizziness,Postural dysfunction  Visit Diagnosis: Dizziness and giddiness  Unsteadiness on feet  Repeated falls     Problem List Patient Active Problem List   Diagnosis Date Noted  . Allergic rhinitis 10/06/2020  . Allergic rhinitis due to animal (cat) (dog) hair and dander 10/06/2020  . Allergic rhinitis due to pollen 10/06/2020  . Moderate persistent asthma, uncomplicated 70/92/9574  . Paresthesia 05/27/2020  . Diabetes mellitus ([place]) 05/25/2020  . Hypertension associated with diabetes ([place]) 05/25/2020  . Hyperlipidemia associated with type 2 diabetes mellitus ([place]) 05/25/2020  . Vitamin D deficiency 05/25/2020  . NAFLD (nonalcoholic fatty liver disease) 05/25/2020  . Sciatica of left side 05/25/2020     Laureen Abrahams, PT, DPT 01/13/21 11:46 AM    [facility] Physical Therapy [address] [place], [place], [address] Phone: [phone]   Fax:  [phone]  Name: DORIAN RENFRO MRN: [id number] Date of Birth: May 02, 1961

## 2021-01-18 ENCOUNTER — Other Ambulatory Visit (INDEPENDENT_AMBULATORY_CARE_PROVIDER_SITE_OTHER): Payer: Self-pay | Admitting: Family Medicine

## 2021-01-18 DIAGNOSIS — R110 Nausea: Secondary | ICD-10-CM

## 2021-01-18 DIAGNOSIS — E538 Deficiency of other specified B group vitamins: Secondary | ICD-10-CM

## 2021-01-18 DIAGNOSIS — G43809 Other migraine, not intractable, without status migrainosus: Secondary | ICD-10-CM

## 2021-01-18 DIAGNOSIS — E1169 Type 2 diabetes mellitus with other specified complication: Secondary | ICD-10-CM

## 2021-01-18 DIAGNOSIS — J3089 Other allergic rhinitis: Secondary | ICD-10-CM | POA: Diagnosis not present

## 2021-01-18 DIAGNOSIS — E559 Vitamin D deficiency, unspecified: Secondary | ICD-10-CM

## 2021-01-18 DIAGNOSIS — F5105 Insomnia due to other mental disorder: Secondary | ICD-10-CM

## 2021-01-18 DIAGNOSIS — K5909 Other constipation: Secondary | ICD-10-CM

## 2021-01-18 DIAGNOSIS — Z79899 Other long term (current) drug therapy: Secondary | ICD-10-CM

## 2021-01-18 DIAGNOSIS — J3081 Allergic rhinitis due to animal (cat) (dog) hair and dander: Secondary | ICD-10-CM | POA: Diagnosis not present

## 2021-01-18 DIAGNOSIS — J301 Allergic rhinitis due to pollen: Secondary | ICD-10-CM | POA: Diagnosis not present

## 2021-01-19 DIAGNOSIS — J3081 Allergic rhinitis due to animal (cat) (dog) hair and dander: Secondary | ICD-10-CM | POA: Diagnosis not present

## 2021-01-19 DIAGNOSIS — J4540 Moderate persistent asthma, uncomplicated: Secondary | ICD-10-CM | POA: Diagnosis not present

## 2021-01-19 DIAGNOSIS — J301 Allergic rhinitis due to pollen: Secondary | ICD-10-CM | POA: Diagnosis not present

## 2021-01-19 DIAGNOSIS — J3089 Other allergic rhinitis: Secondary | ICD-10-CM | POA: Diagnosis not present

## 2021-01-19 NOTE — Telephone Encounter (Signed)
Pt last seen by Dr. Wallace.  

## 2021-01-21 ENCOUNTER — Other Ambulatory Visit: Payer: Medicare HMO

## 2021-01-23 ENCOUNTER — Ambulatory Visit (INDEPENDENT_AMBULATORY_CARE_PROVIDER_SITE_OTHER): Payer: Medicare HMO | Admitting: Physical Therapy

## 2021-01-23 ENCOUNTER — Encounter: Payer: Self-pay | Admitting: Physical Therapy

## 2021-01-23 ENCOUNTER — Other Ambulatory Visit: Payer: Self-pay

## 2021-01-23 DIAGNOSIS — R296 Repeated falls: Secondary | ICD-10-CM

## 2021-01-23 DIAGNOSIS — R42 Dizziness and giddiness: Secondary | ICD-10-CM | POA: Diagnosis not present

## 2021-01-23 DIAGNOSIS — R2681 Unsteadiness on feet: Secondary | ICD-10-CM | POA: Diagnosis not present

## 2021-01-23 NOTE — Patient Instructions (Signed)
Access Code: [email or web address] URL: [email or web address] Date: 01/23/2021 Prepared by: Faustino Congress  Exercises Standing Gaze Stabilization with Two Near Targets and Head Rotation - 2-3 x daily - 7 x weekly - 1 sets - 10 reps Romberg Stance with Eyes Closed - 2-3 x daily - 7 x weekly - 1 sets - 1 reps Wide Stance with Eyes Closed and Head Rotation on Foam Pad - 2 x daily - 7 x weekly - 1 sets - 1 reps - 10 rotations each way Standing Gaze Stabilization with Head Rotation - 2 x daily - 7 x weekly - 1 sets - 30 seconds Standing Gaze Stabilization with Head Nod - 1 x daily - 7 x weekly - 1 reps - 30 seconds

## 2021-01-23 NOTE — Therapy (Signed)
[facility] Physical Therapy [address] [place], [place], [address] Phone: [phone]   Fax:  [phone]  Physical Therapy Treatment  Patient Details  Name: Virginia Luna MRN: [id number] Date of Birth: [birth year] Referring Provider (PT): Briscoe Deutscher, DO   Encounter Date: 01/23/2021   PT End of Session - 01/23/21 0920    Visit Number 3    Number of Visits 6    Date for PT Re-Evaluation 02/17/21    Authorization Type [employer], approved 12 visits through 03/08/21    Authorization - Visit Number 3    Authorization - Number of Visits 12    Progress Note Due on Visit 10    PT Start Time 0839    PT Stop Time 0919    PT Time Calculation (min) 40 min    Activity Tolerance Patient tolerated treatment well    Behavior During Therapy [facility] for tasks assessed/performed           Past Medical History:  Diagnosis Date  . Allergies   . Anemia   . Arthritis   . Asthma   . Back pain   . Chronic pain   . Complication of anesthesia    woke up during colonoscopy  . Diabetes ([place])   . Diabetes mellitus without complication ([place])   . Edema, lower extremity   . Fibroid   . Fibromyalgia    Chronic  . GERD (gastroesophageal reflux disease)   . Headache    migraines  . High blood pressure   . History of hiatal hernia   . History of stomach ulcers   . IBS (irritable bowel syndrome)   . Joint pain   . Sleep apnea    did use cpap-lost 25lb-says she does not need it   NO CPAP  . Wears contact lenses     Past Surgical History:  Procedure Laterality Date  . BREAST BIOPSY    . BREAST EXCISIONAL BIOPSY    . BREAST LUMPECTOMY WITH RADIOACTIVE SEED LOCALIZATION Bilateral 11/24/2014   Procedure: BILATERAL BREAST LUMPECTOMY WITH RADIOACTIVE SEED LOCALIZATION;  Surgeon: [name], MD;  Location: [place];  Service: General;  Laterality: Bilateral;  . CHOLECYSTECTOMY    . COLONOSCOPY    . DILATION AND CURETTAGE OF UTERUS    .  ESOPHAGOGASTRODUODENOSCOPY (EGD) WITH PROPOFOL N/A 07/23/2016   Procedure: ESOPHAGOGASTRODUODENOSCOPY (EGD) WITH PROPOFOL;  Surgeon: Laurence Spates, MD;  [facility];  Service: Endoscopy;  Laterality: N/A;  . ESOPHAGOGASTRODUODENOSCOPY (EGD) WITH PROPOFOL N/A 06/11/2018   Procedure: ESOPHAGOGASTRODUODENOSCOPY (EGD) WITH PROPOFOL;  Surgeon: Laurence Spates, MD;  [facility];  Service: Endoscopy;  Laterality: N/A;  . LUMBAR LAMINECTOMY  2010  . ORIF WRIST FRACTURE Right 11/24/2020   Procedure: OPEN REDUCTION INTERNAL FIXATION RIGHT WRIST FRACTURE;  Surgeon: Renette Butters, MD;  [facility];  Service: Orthopedics;  Laterality: Right;  . UMBILICAL HERNIA REPAIR     age 60  . UPPER GI ENDOSCOPY      There were no vitals filed for this visit.   Subjective Assessment - 01/23/21 0840    Subjective today is "okay." feels overall symptoms are improving, they are less frequent    Patient Stated Goals improve vertigo    Currently in Pain? Yes    Pain Score 9     Pain Location Generalized    Pain Orientation Right;Left    Pain Descriptors / Indicators Aching;Dull    Pain Type Acute pain;Chronic pain    Pain Onset More than  a month ago    Pain Frequency Constant    Pain Relieving Factors medication, muscle relaxers    Effect of Pain on Daily Activities will monitor but will not directly address                              Vestibular Treatment/Exercise - 01/23/21 0843      Vestibular Treatment/Exercise   Vestibular Treatment Provided Gaze    Gaze Exercises X1 Viewing Horizontal;Eye/Head Exercise Horizontal;X1 Viewing Vertical      X1 Viewing Horizontal   Foot Position standing, feet apart    Time --   30 sec   Comments symptoms up to 3/10      X1 Viewing Vertical   Foot Position standing, feet apart    Time --   30 sec   Comments no symptoms              Balance Exercises - 01/23/21 0848      Balance Exercises: Standing   Standing  Eyes Closed Foam/compliant surface;Wide (BOA);Head turns;Solid surface;Narrow base of support (BOS);5 reps;10 secs    Rockerboard Anterior/posterior;Lateral;Head turns;EO;10 reps;Intermittent UE support    Gait with Head Turns Forward;4 reps   horizontal/vertical   Other Standing Exercises Comments feet apart and together: ball toss without LOB and visual follow with ball             PT Education - 01/23/21 0919    Education Details progressed HEP    Person(s) Educated Patient    Methods Explanation;Demonstration;Handout    Comprehension Verbalized understanding;Returned demonstration;Need further instruction            PT Short Term Goals - 01/23/21 0920      PT SHORT TERM GOAL #1   Title Patient will be indepenent with basic HEP    Time 3    Period Weeks    Status Achieved    Target Date 01/27/21      PT SHORT TERM GOAL #2   Title n/a      PT SHORT TERM GOAL #3   Title n/a             PT Long Term Goals - 01/06/21 1021      PT LONG TERM GOAL #1   Title independent with final HEP    Time 6    Period Weeks    Status New    Target Date 02/17/21      PT LONG TERM GOAL #2   Title report 50% improvement in symptoms for improved function    Time 6    Period Weeks    Status New    Target Date 02/17/21      PT LONG TERM GOAL #3   Title perform smooth pursuits without increase in symptoms for improved function    Time 6    Period Weeks    Status New    Target Date 02/17/21      PT LONG TERM GOAL #4   Title report no falls x 3 weeks for improved balance and mobility    Time 6    Period Weeks    Status New    Target Date 02/17/21                 Plan - 01/23/21 0920    Clinical Impression Statement Pt reports improvement in symptoms and tolerated session well today.  Symptoms increased to 5/10 at end needing ~  4 min rest to return to baseline symptoms.  Discussed that we are working on more challenging exercises which are only increasing symptoms  to 5/10, where simple eye/head movements initially increased to 5-6/10.  Will continue to benefit from PT to maximize function.    Personal Factors and Comorbidities Past/Current Experience;Comorbidity 3+;Time since onset of injury/illness/exacerbation    Comorbidities arthritis, asthma, chronic pain, DM, fibromyalgia, HTN, IBS, sleep apnea, brain mass    Examination-Activity Limitations Bed Mobility;Sleep;Bend;Stairs;Stand;Locomotion Level;Transfers    Examination-Participation Restrictions Community Activity;Shop;Meal Prep;Laundry    Stability/Clinical Decision Making Unstable/Unpredictable    Rehab Potential Fair    PT Frequency 1x / week    PT Duration 6 weeks    PT Treatment/Interventions ADLs/Self Care Home Management;Canalith Repostioning;Electrical Stimulation;Moist Heat;Balance training;Therapeutic exercise;Therapeutic activities;Functional mobility training;Stair training;Gait training;Neuromuscular re-education;Patient/family education;Manual techniques;Taping;Energy conservation    PT Next Visit Plan continue with standing gaze, corner balance, dynamic balance activiites    PT Home Exercise Plan Access Code: [email or web address]    Consulted and Agree with Plan of Care Patient           Patient will benefit from skilled therapeutic intervention in order to improve the following deficits and impairments:  Decreased balance,Decreased mobility,Difficulty walking,Dizziness,Postural dysfunction  Visit Diagnosis: Dizziness and giddiness  Unsteadiness on feet  Repeated falls     Problem List Patient Active Problem List   Diagnosis Date Noted  . Allergic rhinitis 10/06/2020  . Allergic rhinitis due to animal (cat) (dog) hair and dander 10/06/2020  . Allergic rhinitis due to pollen 10/06/2020  . Moderate persistent asthma, uncomplicated 38/75/6433  . Paresthesia 05/27/2020  . Diabetes mellitus ([place]) 05/25/2020  . Hypertension associated with diabetes ([place]) 05/25/2020  . Hyperlipidemia  associated with type 2 diabetes mellitus ([place]) 05/25/2020  . Vitamin D deficiency 05/25/2020  . NAFLD (nonalcoholic fatty liver disease) 05/25/2020  . Sciatica of left side 05/25/2020      Laureen Abrahams, PT, DPT 01/23/21 9:23 AM    [facility] Physical Therapy [address] [place], [place], [address] Phone: [phone]   Fax:  [phone]  Name: Virginia Luna MRN: [id number] Date of Birth: 1961-05-15

## 2021-01-25 ENCOUNTER — Encounter (INDEPENDENT_AMBULATORY_CARE_PROVIDER_SITE_OTHER): Payer: Self-pay | Admitting: Family Medicine

## 2021-01-25 ENCOUNTER — Ambulatory Visit (INDEPENDENT_AMBULATORY_CARE_PROVIDER_SITE_OTHER): Payer: Medicare HMO | Admitting: Family Medicine

## 2021-01-25 ENCOUNTER — Other Ambulatory Visit: Payer: Self-pay

## 2021-01-25 VITALS — BP 137/84 | HR 98 | Temp 98.5°F | Ht 61.0 in | Wt 281.0 lb

## 2021-01-25 DIAGNOSIS — Z79899 Other long term (current) drug therapy: Secondary | ICD-10-CM | POA: Diagnosis not present

## 2021-01-25 DIAGNOSIS — Z6841 Body Mass Index (BMI) 40.0 and over, adult: Secondary | ICD-10-CM

## 2021-01-25 DIAGNOSIS — J3081 Allergic rhinitis due to animal (cat) (dog) hair and dander: Secondary | ICD-10-CM | POA: Diagnosis not present

## 2021-01-25 DIAGNOSIS — J301 Allergic rhinitis due to pollen: Secondary | ICD-10-CM | POA: Diagnosis not present

## 2021-01-25 DIAGNOSIS — G9389 Other specified disorders of brain: Secondary | ICD-10-CM

## 2021-01-25 DIAGNOSIS — R110 Nausea: Secondary | ICD-10-CM | POA: Diagnosis not present

## 2021-01-25 DIAGNOSIS — E1169 Type 2 diabetes mellitus with other specified complication: Secondary | ICD-10-CM | POA: Diagnosis not present

## 2021-01-25 DIAGNOSIS — J3089 Other allergic rhinitis: Secondary | ICD-10-CM | POA: Diagnosis not present

## 2021-01-25 MED ORDER — MECLIZINE HCL 25 MG PO TABS: ORAL_TABLET | ORAL | 0 refills | Status: DC

## 2021-01-25 NOTE — Progress Notes (Signed)
Chief Complaint:   OBESITY Virginia Luna is here to discuss her progress with her obesity treatment plan along with follow-up of her obesity related diagnoses. See Medical Weight Management Flowsheet for bioelectrical impedance results.  Today's visit was #: 23 Starting weight: 288 lbs Starting date: 08/27/2019 Today's weight: 281 lbs Today's date: 01/25/2021 Weight change since last visit: 3 lbs Total lbs lost to date: 7 lbs Body mass index is 53.09 kg/m.  Total weight loss percentage to date: -2.43%  Interim History: Virginia Luna had a fall last week. She is unable to report how she fell. Nutrition Plan: practicing portion control and making smarter food choices, such as increasing vegetables and decreasing simple carbohydrates for most of the time. Activity:  Increased walking. Anti-obesity medications: Ozempic 0.5 mg subcutaneously weekly. Reported side effects: None.  Assessment/Plan:   1. Medication management Pain is moderately controlled with meloxicam 15 mg daily. We will continue to monitor symptoms as they relate to her weight loss journey.  2. Nausea Chronic, being followed by PCP and Neurology.  Will refill meclizine today, as per below.  - Refill meclizine (ANTIVERT) 25 MG tablet; TAKE ONE TABLET BY MOUTH EVERY 8 HOURS AS NEEDED FOR DIZZINESS  Dispense: 30 tablet; Refill: 0  3. Brain mass Virginia Luna will continue to follow with Neurology/Neurosurgery. New imaging scheduled. If intervention needed, Virginia Luna will be sent to [facility], per her report. We will continue to work on Neuro PT to maintain balance and strength.   4. Type 2 diabetes mellitus with other specified complication, without long-term current use of insulin (Virginia Luna) Diabetes Mellitus: Not at goal. Medication: Ozempic 0.5 mg subcutaneously weekly. Issues reviewed: blood sugar goals, complications of diabetes mellitus, hypoglycemia prevention and treatment, exercise, and nutrition.   Plan: The patient will continue  to focus on protein-rich, low simple carbohydrate foods. We reviewed the importance of hydration, regular exercise for stress reduction, and restorative sleep.   Lab Results  Component Value Date   HGBA1C 6.0 (H) 10/12/2020   HGBA1C 6.4 (H) 02/08/2020   Lab Results  Component Value Date   LDLCALC 91 02/08/2020   CREATININE 0.61 10/12/2020   5. Obesity, current BMI 53.2  Course: Virginia Luna is currently in the action stage of change. As such, her goal is to continue with weight loss efforts.   Nutrition goals: She has agreed to practicing portion control and making smarter food choices, such as increasing vegetables and decreasing simple carbohydrates.   Exercise goals:  As is.  Behavioral modification strategies: increasing lean protein intake, decreasing simple carbohydrates, increasing vegetables, and increasing water intake.  Virginia Luna has agreed to follow-up with our clinic in 2-3 weeks. She was informed of the importance of frequent follow-up visits to maximize her success with intensive lifestyle modifications for her multiple health conditions.   Objective:   Blood pressure 137/84, pulse 98, temperature 98.5 F (36.9 C), temperature source Oral, height 5\' 1"  (1.549 m), weight 281 lb (127.5 kg), SpO2 98 %. Body mass index is 53.09 kg/m.  General: Cooperative, alert, well developed, in no acute distress. HEENT: Conjunctivae and lids unremarkable. Cardiovascular: Regular rhythm.  Lungs: Normal work of breathing. Neurologic: No focal deficits.   Lab Results  Component Value Date   CREATININE 0.61 10/12/2020   BUN 15 10/12/2020   NA 143 10/12/2020   K 4.3 10/12/2020   CL 103 10/12/2020   CO2 22 10/12/2020   Lab Results  Component Value Date   ALT 9 10/12/2020   AST 9 10/12/2020  GGT 75 (H) 02/08/2020   ALKPHOS 139 (H) 10/12/2020   BILITOT 0.3 10/12/2020   Lab Results  Component Value Date   HGBA1C 6.0 (H) 10/12/2020   HGBA1C 6.4 (H) 02/08/2020   Lab Results   Component Value Date   INSULIN 10.9 10/12/2020   INSULIN 18.5 02/08/2020   INSULIN 15.9 08/27/2019   Lab Results  Component Value Date   TSH 1.040 10/12/2020   Lab Results  Component Value Date   CHOL 176 02/08/2020   HDL 64 02/08/2020   LDLCALC 91 02/08/2020   TRIG 121 02/08/2020   Lab Results  Component Value Date   WBC 8.9 11/24/2020   HGB 11.4 (L) 11/24/2020   HCT 38.7 11/24/2020   MCV 90.6 11/24/2020   PLT 326 11/24/2020   Lab Results  Component Value Date   IRON 38 10/12/2020   TIBC 371 10/12/2020   FERRITIN 45 10/12/2020   Obesity Behavioral Intervention:   Approximately 15 minutes were spent on the discussion below.  ASK: We discussed the diagnosis of obesity with Aniya today and Alayzha agreed to give [name] permission to discuss obesity behavioral modification therapy today.  ASSESS: Kelita has the diagnosis of obesity and her BMI today is 53.2. Taria is in the action stage of change.   ADVISE: Laurenashley was educated on the multiple health risks of obesity as well as the benefit of weight loss to improve her health. She was advised of the need for long term treatment and the importance of lifestyle modifications to improve her current health and to decrease her risk of future health problems.  AGREE: Multiple dietary modification options and treatment options were discussed and Giana agreed to follow the recommendations documented in the above note.  ARRANGE: Lisl was educated on the importance of frequent visits to treat obesity as outlined per CMS and USPSTF guidelines and agreed to schedule her next follow up appointment today.  Attestation Statements:   Reviewed by clinician on day of visit: allergies, medications, problem list, medical history, surgical history, family history, social history, and previous encounter notes.  I, Water quality scientist, CMA, am acting as transcriptionist for Briscoe Deutscher, DO  I have reviewed the above documentation for  accuracy and completeness, and I agree with the above. Briscoe Deutscher, DO

## 2021-01-26 ENCOUNTER — Ambulatory Visit (INDEPENDENT_AMBULATORY_CARE_PROVIDER_SITE_OTHER): Payer: Medicare HMO | Admitting: Family Medicine

## 2021-01-30 ENCOUNTER — Encounter: Payer: Medicare HMO | Admitting: Physical Therapy

## 2021-01-31 DIAGNOSIS — E119 Type 2 diabetes mellitus without complications: Secondary | ICD-10-CM | POA: Diagnosis not present

## 2021-01-31 DIAGNOSIS — I10 Essential (primary) hypertension: Secondary | ICD-10-CM | POA: Diagnosis not present

## 2021-01-31 DIAGNOSIS — J4520 Mild intermittent asthma, uncomplicated: Secondary | ICD-10-CM | POA: Diagnosis not present

## 2021-01-31 DIAGNOSIS — G894 Chronic pain syndrome: Secondary | ICD-10-CM | POA: Diagnosis not present

## 2021-01-31 DIAGNOSIS — M797 Fibromyalgia: Secondary | ICD-10-CM | POA: Diagnosis not present

## 2021-01-31 DIAGNOSIS — K219 Gastro-esophageal reflux disease without esophagitis: Secondary | ICD-10-CM | POA: Diagnosis not present

## 2021-01-31 DIAGNOSIS — D649 Anemia, unspecified: Secondary | ICD-10-CM | POA: Diagnosis not present

## 2021-01-31 DIAGNOSIS — E7800 Pure hypercholesterolemia, unspecified: Secondary | ICD-10-CM | POA: Diagnosis not present

## 2021-02-01 DIAGNOSIS — J3081 Allergic rhinitis due to animal (cat) (dog) hair and dander: Secondary | ICD-10-CM | POA: Diagnosis not present

## 2021-02-01 DIAGNOSIS — J301 Allergic rhinitis due to pollen: Secondary | ICD-10-CM | POA: Diagnosis not present

## 2021-02-01 DIAGNOSIS — J3089 Other allergic rhinitis: Secondary | ICD-10-CM | POA: Diagnosis not present

## 2021-02-01 NOTE — Progress Notes (Signed)
NEUROLOGY FOLLOW UP OFFICE NOTE  Virginia Luna [id number]  Assessment/Plan:   1.Vestibular migraine/chronic migraine without aura, without status migrainosus, not intractable 2. She has some persistent vertigo outside of the headache - may still be migrainous but cannot rule out cerebellar hemangiom 2.Cerebellar hemangioma     Emgality helpful but too expensive.  Will submit to start Botox.  Patient has chronic migraines - averaging 16 headache days a month for over 3 consecutive months and has failed multiple preventatives such as topiramate, nortriptyline, amitriptyline and venlafaxine. For migraine rescue:  Stop sumatriptan and start Maxalt-MLT 10mg  for better/quicker absorption in setting of nausea.  Zofran for nausea. Follow up with MRI of brain with Dr. Ronnald Ramp of neurosurgery Continue vestibular rehab Follow up for Botox.  She will follow up for regular office visit in about 7 months (after 2 full rounds of Botox)  Subjective:  Virginia Luna is a 60 year old right-handed female with diabetes, chronic back pain, and IBS who follows up for migraine and brain mass.  Brain MRIs from 2/5 and 3/13 were personally reviewed.  UPDATE: MRI of brain with and without contrast on 09/03/2020 showed enhancing 8 x 6 mm posterior fossa mass left of midline with edema within the superior cerebellum.  Follow up MRI on 09/24/2020 was stable.  She was referred to neurosurgery who favored to closely monitor.  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  She has a repeat MRI scheduled on 02/18/2021.  Regarding headaches, she was started on Emgality in February.  It was approved but too expensive.  Applied for assistance from [place] but was denied.  Last injection was 6 weeks ago.  Migraines were improved on Emgality.  She passed out in April.  She has been having vertigo.  She has been sent to PT for vestibular rehab, which has helped.   However, she still frequently feels a little dizzy.  As far as migraines: Intensity:  moderate severe Duration:  4 days Frequency:  once a week Current NSAIDS/analgesics:  Hydrocodone-acetaminophen (pain), diclofenac 75mg  Current triptans:  Sumatriptan 100mg  Current ergotamine:  none Current anti-emetic:  Zofran ODT 4mg  Current muscle relaxants:  Cyclobenzaprine 10mg  BID PRN Current Antihypertensive medications:  Metoprolol succinate, amlodipine Current Antidepressant medications:  none Current Anticonvulsant medications:  gabapenin 300mg  QHS Current anti-CGRP:  none Current Vitamins/Herbal/Supplements:  D, melatonin Current Antihistamines/Decongestants:  Meclizine 25mg  PRN, Benadryl 50mg  QHS, Flonase Other therapy:  ice Hormone/birth control:  none Other medications:  Ambien  Caffeine:  No coffee.  Occasional Coke Diet:  1 gallon water daily.  Tries not to skip meals.   Exercise:  Unable due to pain, nausea and dizziness Depression:  no; Anxiety:  no Other pain:  fibromyalgia Sleep hygiene:  varies  HISTORY:  She has had migraines for many years.  They were manageable, usually occurring once a month.  They started to become more frequent in 2020, progressing to the point that she now has a persistent daily headache.  They are typically bifrontal and pounding, associated with nausea, vomiting, vertigo, photophobia, phonophobia and blurred vision but no numbness or weakness.  Intensity will fluctuate from dull-moderate to severe about once a week for 2-3 days.  Sunlight, loud noise and quick movements are aggravating factors.  Resting in a dark and cool quiet room helps relieve pain.  She cannot really identify a specific trigger for her chronic daily headache but it may have followed a fall in which  she tripped and hit her head.   She treats headache with sumatriptan (2-3 days a week) and treats nausea with Zofran and dizziness with meclizine.  She also takes hydrocodone for  fibromyalgia.    CT head on 08/08/2000 was negative.    Past NSAIDS/analgesics:  Ibuprofen, naproxen, Excedrin/BC/Goody Past abortive triptans:  none Past abortive ergotamine:  none Past muscle relaxants:  none Past anti-emetic:  Promethazine 25mg  Past antihypertensive medications:  none Past antidepressant medications:  Amitriptyline or nortriptyline, venlafaxine Past anticonvulsant medications:  topiramate Past anti-CGRP:  none Past vitamins/Herbal/Supplements:  none Past antihistamines/decongestants:  none Other past therapies:  none    Family history of headache:  unknown  PAST MEDICAL HISTORY: Past Medical History:  Diagnosis Date   Allergies    Anemia    Arthritis    Asthma    Back pain    Chronic pain    Complication of anesthesia    woke up during colonoscopy   Diabetes ([place])    Diabetes mellitus without complication (HCC)    Edema, lower extremity    Fibroid    Fibromyalgia    Chronic   GERD (gastroesophageal reflux disease)    Headache    migraines   High blood pressure    History of hiatal hernia    History of stomach ulcers    IBS (irritable bowel syndrome)    Joint pain    Sleep apnea    did use cpap-lost 25lb-says she does not need it   NO CPAP   Wears contact lenses     MEDICATIONS: Current Outpatient Medications on File Prior to Visit  Medication Sig Dispense Refill   acetaminophen (TYLENOL) 500 MG tablet Take 2 tablets (1,000 mg total) by mouth every 6 (six) hours as needed for mild pain or moderate pain. 60 tablet 0   albuterol (PROVENTIL HFA;VENTOLIN HFA) 108 (90 BASE) MCG/ACT inhaler Inhale 2 puffs into the lungs every 6 (six) hours as needed for wheezing or shortness of breath.      atorvastatin (LIPITOR) 10 MG tablet Take 10 mg by mouth at bedtime.     azelastine (ASTELIN) 0.1 % nasal spray Place 1 spray into both nostrils 2 (two) times daily as needed for allergies.  2   EPINEPHrine 0.3 mg/0.3 mL IJ SOAJ injection Inject 0.3 mg into the  muscle as needed for anaphylaxis.     fluticasone (FLONASE) 50 MCG/ACT nasal spray Place 1 spray into both nostrils daily as needed for allergies.     fluticasone furoate-vilanterol (BREO ELLIPTA) 200-25 MCG/INH AEPB Inhale 1 puff daily into the lungs.     gabapentin (NEURONTIN) 300 MG capsule Take 600 mg by mouth at bedtime.     Galcanezumab-gnlm (EMGALITY) 120 MG/ML SOAJ Inject 120 mg into the skin every 28 (twenty-eight) days. 1.12 mL 5   HYDROcodone-acetaminophen (NORCO/VICODIN) 5-325 MG tablet Take 1 tablet by mouth 2 (two) times daily as needed for moderate pain.   0   Insulin Pen Needle (BD PEN NEEDLE NANO 2ND GEN) 32G X 4 MM MISC 1 Package by Does not apply route daily. 100 each 0   levocetirizine (XYZAL) 5 MG tablet Take 1 tablet (5 mg total) by mouth every evening. 90 tablet 0   linaclotide (LINZESS) 72 MCG capsule Take 1 capsule (72 mcg total) by mouth every morning. (Patient taking differently: Take 72 mcg by mouth daily as needed (constipation).) 90 capsule 0   meclizine (ANTIVERT) 25 MG tablet TAKE ONE TABLET BY MOUTH EVERY 8 HOURS  AS NEEDED FOR DIZZINESS 30 tablet 0   meloxicam (MOBIC) 15 MG tablet Take 1 tablet (15 mg total) by mouth daily. For pain and inflammation 30 tablet 0   methocarbamol (ROBAXIN) 500 MG tablet TAKE ONE TABLET BY MOUTH every EIGHT hours AS NEEDED FOR MUSCLE SPASMS 20 tablet 0   metoprolol succinate (TOPROL-XL) 25 MG 24 hr tablet Take 25 mg by mouth every morning.     montelukast (SINGULAIR) 10 MG tablet Take 1 tablet (10 mg total) by mouth at bedtime. 30 tablet 3   ondansetron (ZOFRAN ODT) 4 MG disintegrating tablet Take 1 tablet (4 mg total) by mouth every 6 (six) hours as needed for nausea or vomiting. 20 tablet 0   pantoprazole (PROTONIX) 40 MG tablet Take 40 mg by mouth 2 (two) times daily.  1   QUEtiapine (SEROQUEL) 300 MG tablet TAKE ONE TABLET BY MOUTH EVERYDAY AT BEDTIME (Patient taking differently: Take 300 mg by mouth at bedtime.) 90 tablet 3    Semaglutide,0.25 or 0.5MG /DOS, (OZEMPIC, 0.25 OR 0.5 MG/DOSE,) 2 MG/1.5ML SOPN Inject 0.5 mg into the skin once a week. 1.5 mL 0   SUMAtriptan (IMITREX) 100 MG tablet take 1 tablet by mouth daily as needed for migraine. may repeat in 2 hours if headache persists or recurs 9 tablet 0   Vitamin D, Ergocalciferol, (DRISDOL) 1.25 MG (50000 UNIT) CAPS capsule TAKE ONE CAPSULE BY MOUTH EVERY 7 DAYS 4 capsule 0   zolpidem (AMBIEN) 10 MG tablet TAKE ONE TABLET BY MOUTH EVERYDAY AT BEDTIME (Patient taking differently: Take 10 mg by mouth at bedtime.) 30 tablet 4   No current facility-administered medications on file prior to visit.    ALLERGIES: Allergies  Allergen Reactions   Rocephin [Ceftriaxone] Anaphylaxis   Morphine And Related Itching and Nausea And Vomiting    Doesn't work   Prednisone Itching and Swelling   Sulfa Antibiotics Itching and Swelling    FAMILY HISTORY: Family History  Problem Relation Age of Onset   Breast cancer Paternal 43    Breast cancer Paternal Grandmother    Obesity Mother    Diabetes Father    High blood pressure Father    Sudden death Father       Objective:  Blood pressure (!) 162/98, pulse 98, height 5\' 1"  (1.549 m), weight 265 lb 12.8 oz (120.6 kg), SpO2 95 %. General: No acute distress.  Patient appears well-groomed.   Head:  Normocephalic/atraumatic Eyes:  Fundi examined but not visualized Neck: supple, no paraspinal tenderness, full range of motion Heart:  Regular rate and rhythm Lungs:  Clear to auscultation bilaterally Back: No paraspinal tenderness Neurological Exam: alert and oriented to person, place, and time.  Speech fluent and not dysarthric, language intact.  CN II-XII intact. Bulk and tone normal, muscle strength 5/5 throughout.  Sensation to light touch intact.  Deep tendon reflexes 2+ throughout.  Finger to nose testing intact.  Gait normal, Romberg mild sway.    Metta Clines, DO  CC:  Shirline Frees, MD

## 2021-02-02 ENCOUNTER — Ambulatory Visit (INDEPENDENT_AMBULATORY_CARE_PROVIDER_SITE_OTHER): Payer: Medicare HMO | Admitting: Neurology

## 2021-02-02 ENCOUNTER — Encounter: Payer: Self-pay | Admitting: Neurology

## 2021-02-02 ENCOUNTER — Other Ambulatory Visit: Payer: Self-pay

## 2021-02-02 VITALS — BP 162/98 | HR 98 | Ht 61.0 in | Wt 265.8 lb

## 2021-02-02 DIAGNOSIS — G43719 Chronic migraine without aura, intractable, without status migrainosus: Secondary | ICD-10-CM

## 2021-02-02 DIAGNOSIS — D496 Neoplasm of unspecified behavior of brain: Secondary | ICD-10-CM

## 2021-02-02 MED ORDER — RIZATRIPTAN BENZOATE 10 MG PO TBDP: ORAL_TABLET | ORAL | 5 refills | Status: DC

## 2021-02-02 NOTE — Patient Instructions (Addendum)
Will set up to begin Botox Stop sumatriptan. Instead, take rizatriptan as directed Limit use of pain relievers to no more than 2 days out of week to prevent risk of rebound or medication-overuse headache. Follow up for MRI of brain and with Dr. Ronnald Ramp as instructed Continue vestibular rehab Follow up in office in 7 months.For

## 2021-02-03 ENCOUNTER — Telehealth: Payer: Self-pay | Admitting: Neurology

## 2021-02-03 NOTE — Telephone Encounter (Signed)
02/02/2021  called and spoke with  Audelia Hives @ [facility]  call ref# [id number]  With her HMO Plan, the primary care office will need to send a referral  in order to be covered   Once referral received No Prior Josem Kaufmann is required for [id number] / [phone] Botox Patient would have a $20 copay No Specialty Pharmacy - this would be buy and bill

## 2021-02-06 ENCOUNTER — Other Ambulatory Visit: Payer: Self-pay

## 2021-02-06 ENCOUNTER — Encounter: Payer: Self-pay | Admitting: Physical Therapy

## 2021-02-06 ENCOUNTER — Ambulatory Visit: Payer: Medicare HMO | Admitting: Physical Therapy

## 2021-02-06 DIAGNOSIS — R2681 Unsteadiness on feet: Secondary | ICD-10-CM

## 2021-02-06 DIAGNOSIS — G8929 Other chronic pain: Secondary | ICD-10-CM

## 2021-02-06 DIAGNOSIS — M6281 Muscle weakness (generalized): Secondary | ICD-10-CM

## 2021-02-06 DIAGNOSIS — R42 Dizziness and giddiness: Secondary | ICD-10-CM | POA: Diagnosis not present

## 2021-02-06 DIAGNOSIS — M25562 Pain in left knee: Secondary | ICD-10-CM | POA: Diagnosis not present

## 2021-02-06 DIAGNOSIS — M25561 Pain in right knee: Secondary | ICD-10-CM | POA: Diagnosis not present

## 2021-02-06 DIAGNOSIS — R262 Difficulty in walking, not elsewhere classified: Secondary | ICD-10-CM | POA: Diagnosis not present

## 2021-02-06 DIAGNOSIS — R296 Repeated falls: Secondary | ICD-10-CM | POA: Diagnosis not present

## 2021-02-06 NOTE — Therapy (Signed)
[facility] Physical Therapy [address] [place], [place], [address] Phone: [phone]   Fax:  [phone]  Physical Therapy Treatment  Patient Details  Name: Virginia Luna MRN: [id number] Date of Birth: 08/17/1961 Referring Provider (PT): Briscoe Deutscher, DO   Encounter Date: 02/06/2021   PT End of Session - 02/06/21 1008     Visit Number 4    Number of Visits 6    Date for PT Re-Evaluation 02/17/21    Authorization Type [employer], approved 12 visits through 03/08/21    Authorization - Visit Number 4    Authorization - Number of Visits 12    Progress Note Due on Visit 10    PT Start Time 0929    PT Stop Time 1007    PT Time Calculation (min) 38 min    Activity Tolerance Patient tolerated treatment well    Behavior During Therapy [facility] for tasks assessed/performed             Past Medical History:  Diagnosis Date   Allergies    Anemia    Arthritis    Asthma    Back pain    Chronic pain    Complication of anesthesia    woke up during colonoscopy   Diabetes ([place])    Diabetes mellitus without complication (HCC)    Edema, lower extremity    Fibroid    Fibromyalgia    Chronic   GERD (gastroesophageal reflux disease)    Headache    migraines   High blood pressure    History of hiatal hernia    History of stomach ulcers    IBS (irritable bowel syndrome)    Joint pain    Sleep apnea    did use cpap-lost 25lb-says she does not need it   NO CPAP   Wears contact lenses     Past Surgical History:  Procedure Laterality Date   BREAST BIOPSY     BREAST EXCISIONAL BIOPSY     BREAST LUMPECTOMY WITH RADIOACTIVE SEED LOCALIZATION Bilateral 11/24/2014   Procedure: BILATERAL BREAST LUMPECTOMY WITH RADIOACTIVE SEED LOCALIZATION;  Surgeon: [name], MD;  Location: [facility];  Service: General;  Laterality: Bilateral;   CHOLECYSTECTOMY     COLONOSCOPY     DILATION AND CURETTAGE OF UTERUS     ESOPHAGOGASTRODUODENOSCOPY (EGD) WITH  PROPOFOL N/A 07/23/2016   Procedure: ESOPHAGOGASTRODUODENOSCOPY (EGD) WITH PROPOFOL;  Surgeon: Laurence Spates, MD;  Location: [facility];  Service: Endoscopy;  Laterality: N/A;   ESOPHAGOGASTRODUODENOSCOPY (EGD) WITH PROPOFOL N/A 06/11/2018   Procedure: ESOPHAGOGASTRODUODENOSCOPY (EGD) WITH PROPOFOL;  Surgeon: Laurence Spates, MD;  Location: [facility];  Service: Endoscopy;  Laterality: N/A;   LUMBAR LAMINECTOMY  2010   ORIF WRIST FRACTURE Right 11/24/2020   Procedure: OPEN REDUCTION INTERNAL FIXATION RIGHT WRIST FRACTURE;  Surgeon: Renette Butters, MD;  Location: [facility];  Service: Orthopedics;  Laterality: Right;   UMBILICAL HERNIA REPAIR     age 72   UPPER GI ENDOSCOPY      There were no vitals filed for this visit.   Subjective Assessment - 02/06/21 0929     Subjective missed last week - did see neurologist in the time since last visit and has had some changes to migraine medication.  reports she fell again - was sitting on sofa, felt "weird" then was on the floor.  still wants to work on some balance exercises    Patient Stated Goals improve vertigo    Currently in Pain? Yes  Pain Score 6     Pain Location Generalized    Pain Orientation Right;Left    Pain Descriptors / Indicators Aching;Dull    Pain Type Acute pain;Chronic pain    Pain Onset More than a month ago    Pain Frequency Constant                                    Balance Exercises - 02/06/21 0942       Balance Exercises: Standing   Standing Eyes Closed Narrow base of support (BOS);Solid surface;Head turns    SLS Eyes open   single tap/double tap to cones - min A   Gait with Head Turns Forward;4 reps    Tandem Gait Forward;Upper extremity support;4 reps      OTAGO PROGRAM   Knee Extensor 20 reps;Weight (comment)   3#   Knee Flexor 20 reps    Hip ABductor 20 reps    Ankle Plantorflexors 20 reps, support    Sit to Stand 20 reps, no support                 PT Short Term  Goals - 01/23/21 0920       PT SHORT TERM GOAL #1   Title Patient will be indepenent with basic HEP    Time 3    Period Weeks    Status Achieved    Target Date 01/27/21      PT SHORT TERM GOAL #2   Title n/a      PT SHORT TERM GOAL #3   Title n/a               PT Long Term Goals - 01/06/21 1021       PT LONG TERM GOAL #1   Title independent with final HEP    Time 6    Period Weeks    Status New    Target Date 02/17/21      PT LONG TERM GOAL #2   Title report 50% improvement in symptoms for improved function    Time 6    Period Weeks    Status New    Target Date 02/17/21      PT LONG TERM GOAL #3   Title perform smooth pursuits without increase in symptoms for improved function    Time 6    Period Weeks    Status New    Target Date 02/17/21      PT LONG TERM GOAL #4   Title report no falls x 3 weeks for improved balance and mobility    Time 6    Period Weeks    Status New    Target Date 02/17/21                   Plan - 02/06/21 1009     Clinical Impression Statement Focused on dynamic balance work today incorporating some of OTAGO balance program with good response and minimial vestibular symptoms.  Anticipate holding PT after next session.    Personal Factors and Comorbidities Past/Current Experience;Comorbidity 3+;Time since onset of injury/illness/exacerbation    Comorbidities arthritis, asthma, chronic pain, DM, fibromyalgia, HTN, IBS, sleep apnea, brain mass    Examination-Activity Limitations Bed Mobility;Sleep;Bend;Stairs;Stand;Locomotion Level;Transfers    Examination-Participation Restrictions Community Activity;Shop;Meal Prep;Laundry    Stability/Clinical Decision Making Unstable/Unpredictable    Rehab Potential Fair    PT Frequency 1x / week  PT Duration 6 weeks    PT Treatment/Interventions ADLs/Self Care Home Management;Canalith Repostioning;Electrical Stimulation;Moist Heat;Balance training;Therapeutic exercise;Therapeutic  activities;Functional mobility training;Stair training;Gait training;Neuromuscular re-education;Patient/family education;Manual techniques;Taping;Energy conservation    PT Next Visit Plan continue with standing gaze, corner balance, dynamic balance activiites    PT Home Exercise Plan Access Code: [email or web address]    Consulted and Agree with Plan of Care Patient             Patient will benefit from skilled therapeutic intervention in order to improve the following deficits and impairments:  Decreased balance, Decreased mobility, Difficulty walking, Dizziness, Postural dysfunction  Visit Diagnosis: Dizziness and giddiness  Unsteadiness on feet  Repeated falls  Chronic pain of left knee  Difficulty in walking, not elsewhere classified  Chronic pain of right knee  Muscle weakness (generalized)     Problem List Patient Active Problem List   Diagnosis Date Noted   Allergic rhinitis 10/06/2020   Allergic rhinitis due to animal (cat) (dog) hair and dander 10/06/2020   Allergic rhinitis due to pollen 10/06/2020   Moderate persistent asthma, uncomplicated 85/27/7824   Paresthesia 05/27/2020   Diabetes mellitus ([place]) 05/25/2020   Hypertension associated with diabetes ([place]) 05/25/2020   Hyperlipidemia associated with type 2 diabetes mellitus ([place]) 05/25/2020   Vitamin D deficiency 05/25/2020   NAFLD (nonalcoholic fatty liver disease) 05/25/2020   Sciatica of left side 05/25/2020      Laureen Abrahams, PT, DPT 02/06/21 10:11 AM     [facility] Physical Therapy [address] [place], [place], [address] Phone: [phone]   Fax:  [phone]  Name: SHARISSA BRIERLEY MRN: [id number] Date of Birth: 27-Nov-1960

## 2021-02-08 DIAGNOSIS — J3081 Allergic rhinitis due to animal (cat) (dog) hair and dander: Secondary | ICD-10-CM | POA: Diagnosis not present

## 2021-02-08 DIAGNOSIS — J301 Allergic rhinitis due to pollen: Secondary | ICD-10-CM | POA: Diagnosis not present

## 2021-02-08 DIAGNOSIS — J3089 Other allergic rhinitis: Secondary | ICD-10-CM | POA: Diagnosis not present

## 2021-02-09 ENCOUNTER — Other Ambulatory Visit: Payer: Self-pay | Admitting: Psychiatry

## 2021-02-09 ENCOUNTER — Other Ambulatory Visit (INDEPENDENT_AMBULATORY_CARE_PROVIDER_SITE_OTHER): Payer: Self-pay | Admitting: Family Medicine

## 2021-02-09 DIAGNOSIS — R110 Nausea: Secondary | ICD-10-CM

## 2021-02-09 DIAGNOSIS — E538 Deficiency of other specified B group vitamins: Secondary | ICD-10-CM

## 2021-02-09 DIAGNOSIS — K5909 Other constipation: Secondary | ICD-10-CM

## 2021-02-09 DIAGNOSIS — E559 Vitamin D deficiency, unspecified: Secondary | ICD-10-CM

## 2021-02-09 DIAGNOSIS — J301 Allergic rhinitis due to pollen: Secondary | ICD-10-CM

## 2021-02-09 DIAGNOSIS — G43809 Other migraine, not intractable, without status migrainosus: Secondary | ICD-10-CM

## 2021-02-09 DIAGNOSIS — Z79899 Other long term (current) drug therapy: Secondary | ICD-10-CM

## 2021-02-09 DIAGNOSIS — F5105 Insomnia due to other mental disorder: Secondary | ICD-10-CM

## 2021-02-09 DIAGNOSIS — E1169 Type 2 diabetes mellitus with other specified complication: Secondary | ICD-10-CM

## 2021-02-09 NOTE — Telephone Encounter (Signed)
F/u due in December.Last filled 6/3/2

## 2021-02-09 NOTE — Telephone Encounter (Signed)
Last OV with Dr Wallace 

## 2021-02-13 ENCOUNTER — Other Ambulatory Visit: Payer: Self-pay

## 2021-02-13 ENCOUNTER — Ambulatory Visit (INDEPENDENT_AMBULATORY_CARE_PROVIDER_SITE_OTHER): Payer: Medicare HMO | Admitting: Physical Therapy

## 2021-02-13 ENCOUNTER — Encounter: Payer: Self-pay | Admitting: Physical Therapy

## 2021-02-13 DIAGNOSIS — R42 Dizziness and giddiness: Secondary | ICD-10-CM

## 2021-02-13 DIAGNOSIS — R2681 Unsteadiness on feet: Secondary | ICD-10-CM

## 2021-02-13 DIAGNOSIS — R296 Repeated falls: Secondary | ICD-10-CM

## 2021-02-13 NOTE — Therapy (Signed)
[facility] [address] [place], [place], [address] Phone: [phone]   Fax:  [phone]  Physical Therapy Treatment  Patient Details  Name: Virginia Luna MRN: [id number] Date of Birth: Dec 23, 1960 Referring Provider (PT): Briscoe Deutscher, DO   Encounter Date: 02/13/2021   PT End of Session - 02/13/21 1001     Visit Number 5    Number of Visits 6    Date for PT Re-Evaluation 02/17/21    Authorization Type [employer], approved 12 visits through 03/08/21    Authorization - Visit Number 5    Authorization - Number of Visits 12    Progress Note Due on Visit 10    PT Start Time 0927    PT Stop Time 0957    PT Time Calculation (min) 30 min    Activity Tolerance Patient tolerated treatment well    Behavior During Therapy [facility] for tasks assessed/performed             Past Medical History:  Diagnosis Date   Allergies    Anemia    Arthritis    Asthma    Back pain    Chronic pain    Complication of anesthesia    woke up during colonoscopy   Diabetes ([place])    Diabetes mellitus without complication (HCC)    Edema, lower extremity    Fibroid    Fibromyalgia    Chronic   GERD (gastroesophageal reflux disease)    Headache    migraines   High blood pressure    History of hiatal hernia    History of stomach ulcers    IBS (irritable bowel syndrome)    Joint pain    Sleep apnea    did use cpap-lost 25lb-says she does not need it   NO CPAP   Wears contact lenses     Past Surgical History:  Procedure Laterality Date   BREAST BIOPSY     BREAST EXCISIONAL BIOPSY     BREAST LUMPECTOMY WITH RADIOACTIVE SEED LOCALIZATION Bilateral 11/24/2014   Procedure: BILATERAL BREAST LUMPECTOMY WITH RADIOACTIVE SEED LOCALIZATION;  Surgeon: [name], MD;  Location: [place];  Service: General;  Laterality: Bilateral;   CHOLECYSTECTOMY     COLONOSCOPY     DILATION AND CURETTAGE OF UTERUS     ESOPHAGOGASTRODUODENOSCOPY (EGD) WITH  PROPOFOL N/A 07/23/2016   Procedure: ESOPHAGOGASTRODUODENOSCOPY (EGD) WITH PROPOFOL;  Surgeon: Laurence Spates, MD;  Location: [facility];  Service: Endoscopy;  Laterality: N/A;   ESOPHAGOGASTRODUODENOSCOPY (EGD) WITH PROPOFOL N/A 06/11/2018   Procedure: ESOPHAGOGASTRODUODENOSCOPY (EGD) WITH PROPOFOL;  Surgeon: Laurence Spates, MD;  Location: [facility];  Service: Endoscopy;  Laterality: N/A;   LUMBAR LAMINECTOMY  2010   ORIF WRIST FRACTURE Right 11/24/2020   Procedure: OPEN REDUCTION INTERNAL FIXATION RIGHT WRIST FRACTURE;  Surgeon: Renette Butters, MD;  Location: [facility];  Service: Orthopedics;  Laterality: Right;   UMBILICAL HERNIA REPAIR     age 60   UPPER GI ENDOSCOPY      There were no vitals filed for this visit.   Subjective Assessment - 02/13/21 0924     Subjective doing well; dizziness is mild.  agreeable to hold PT after today    Patient Stated Goals improve vertigo    Currently in Pain? Yes    Pain Score 5     Pain Location Generalized    Pain Descriptors / Indicators Aching;Dull    Pain Type Acute pain;Chronic pain    Pain Onset More than a month  ago    Pain Frequency Constant    Aggravating Factors  constant    Pain Relieving Factors medication, muscle relaxers                                Vestibular Treatment/Exercise - 02/13/21 0932       Vestibular Treatment/Exercise   Vestibular Treatment Provided Gaze    Gaze Exercises X1 Viewing Horizontal;X1 Viewing Vertical      X1 Viewing Horizontal   Foot Position standing, feet apart    Time --   30 sec   Reps 2    Comments cues for increased head speed      X1 Viewing Vertical   Foot Position standing, feet apart    Time --   30 sec   Reps 1    Comments no symptoms                Balance Exercises - 02/13/21 0001       Balance Exercises: Standing   Standing Eyes Closed Solid surface;Narrow base of support (BOS);Head turns;Foam/compliant surface;Wide (BOA)    Tandem Gait  Forward;Upper extremity support;4 reps                 PT Short Term Goals - 01/23/21 0920       PT SHORT TERM GOAL #1   Title Patient will be indepenent with basic HEP    Time 3    Period Weeks    Status Achieved    Target Date 01/27/21      PT SHORT TERM GOAL #2   Title n/a      PT SHORT TERM GOAL #3   Title n/a               PT Long Term Goals - 02/13/21 1001       PT LONG TERM GOAL #1   Title independent with final HEP    Time 6    Period Weeks    Status Achieved      PT LONG TERM GOAL #2   Title report 50% improvement in symptoms for improved function    Time 6    Period Weeks    Status Achieved      PT LONG TERM GOAL #3   Title perform smooth pursuits without increase in symptoms for improved function    Time 6    Period Weeks    Status Achieved      PT LONG TERM GOAL #4   Title report no falls x 3 weeks for improved balance and mobility    Baseline 6/27: "fall" off couch - sounds more like syncope than true fall    Time 6    Period Weeks    Status Achieved                   Plan - 02/13/21 1002     Clinical Impression Statement Pt tolerated session well today and has met all goals today.  Plan to hold PT and pt will call if vertigo returns.    Personal Factors and Comorbidities Past/Current Experience;Comorbidity 3+;Time since onset of injury/illness/exacerbation    Comorbidities arthritis, asthma, chronic pain, DM, fibromyalgia, HTN, IBS, sleep apnea, brain mass    Examination-Activity Limitations Bed Mobility;Sleep;Bend;Stairs;Stand;Locomotion Level;Transfers    Examination-Participation Restrictions Community Activity;Shop;Meal Prep;Laundry    Stability/Clinical Decision Making Unstable/Unpredictable    Rehab Potential Fair    PT Frequency 1x /  week    PT Duration 6 weeks    PT Treatment/Interventions ADLs/Self Care Home Management;Canalith Repostioning;Electrical Stimulation;Moist Heat;Balance training;Therapeutic  exercise;Therapeutic activities;Functional mobility training;Stair training;Gait training;Neuromuscular re-education;Patient/family education;Manual techniques;Taping;Energy conservation    PT Next Visit Plan hold PT today, recert v/s d/c    PT Home Exercise Plan Access Code: [email or web address]    Consulted and Agree with Plan of Care Patient             Patient will benefit from skilled therapeutic intervention in order to improve the following deficits and impairments:  Decreased balance, Decreased mobility, Difficulty walking, Dizziness, Postural dysfunction  Visit Diagnosis: Dizziness and giddiness  Unsteadiness on feet  Repeated falls     Problem List Patient Active Problem List   Diagnosis Date Noted   Allergic rhinitis 10/06/2020   Allergic rhinitis due to animal (cat) (dog) hair and dander 10/06/2020   Allergic rhinitis due to pollen 10/06/2020   Moderate persistent asthma, uncomplicated 93/73/4287   Paresthesia 05/27/2020   Diabetes mellitus ([place]) 05/25/2020   Hypertension associated with diabetes ([place]) 05/25/2020   Hyperlipidemia associated with type 2 diabetes mellitus ([place]) 05/25/2020   Vitamin D deficiency 05/25/2020   NAFLD (nonalcoholic fatty liver disease) 05/25/2020   Sciatica of left side 05/25/2020        Laureen Abrahams, PT, DPT 02/13/21 10:05 AM     [facility] [address] [place], [place], [address] Phone: [phone]   Fax:  [phone]  Name: Virginia Luna MRN: [id number] Date of Birth: [birth year]

## 2021-02-13 NOTE — Patient Instructions (Signed)
Access Code: [email or web address] URL: [email or web address] Date: 02/13/2021 Prepared by: Faustino Congress  Exercises Wide Stance with Eyes Closed and Head Rotation on Foam Pad - 2 x daily - 7 x weekly - 1 sets - 1 reps - 10 rotations each way Standing Gaze Stabilization with Head Rotation - 2 x daily - 7 x weekly - 1 sets - 30 seconds Standing Gaze Stabilization with Head Nod - 1 x daily - 7 x weekly - 1 reps - 30 seconds Narrow Stance with Eyes Closed and Head Nods - 1 x daily - 7 x weekly - 1 sets - 10 reps Narrow Stance with Eyes Closed and Head Rotation - 1 x daily - 7 x weekly - 1 sets - 10 reps Tandem Walking with Counter Support - 1 x daily - 7 x weekly - 1 sets - 4 reps

## 2021-02-14 ENCOUNTER — Ambulatory Visit (INDEPENDENT_AMBULATORY_CARE_PROVIDER_SITE_OTHER): Payer: Medicare HMO | Admitting: Family Medicine

## 2021-02-14 ENCOUNTER — Encounter (INDEPENDENT_AMBULATORY_CARE_PROVIDER_SITE_OTHER): Payer: Self-pay | Admitting: Family Medicine

## 2021-02-14 VITALS — BP 141/86 | HR 94 | Temp 97.7°F | Ht 61.0 in | Wt 270.0 lb

## 2021-02-14 DIAGNOSIS — I152 Hypertension secondary to endocrine disorders: Secondary | ICD-10-CM

## 2021-02-14 DIAGNOSIS — E1169 Type 2 diabetes mellitus with other specified complication: Secondary | ICD-10-CM | POA: Diagnosis not present

## 2021-02-14 DIAGNOSIS — E1159 Type 2 diabetes mellitus with other circulatory complications: Secondary | ICD-10-CM | POA: Diagnosis not present

## 2021-02-14 DIAGNOSIS — R42 Dizziness and giddiness: Secondary | ICD-10-CM | POA: Diagnosis not present

## 2021-02-14 DIAGNOSIS — Z6841 Body Mass Index (BMI) 40.0 and over, adult: Secondary | ICD-10-CM | POA: Diagnosis not present

## 2021-02-14 DIAGNOSIS — E538 Deficiency of other specified B group vitamins: Secondary | ICD-10-CM | POA: Diagnosis not present

## 2021-02-14 DIAGNOSIS — M2140 Flat foot [pes planus] (acquired), unspecified foot: Secondary | ICD-10-CM

## 2021-02-14 DIAGNOSIS — E559 Vitamin D deficiency, unspecified: Secondary | ICD-10-CM

## 2021-02-14 DIAGNOSIS — G9389 Other specified disorders of brain: Secondary | ICD-10-CM

## 2021-02-14 DIAGNOSIS — Z79899 Other long term (current) drug therapy: Secondary | ICD-10-CM | POA: Diagnosis not present

## 2021-02-14 DIAGNOSIS — E66813 Obesity, class 3: Secondary | ICD-10-CM

## 2021-02-15 DIAGNOSIS — I10 Essential (primary) hypertension: Secondary | ICD-10-CM | POA: Diagnosis not present

## 2021-02-15 DIAGNOSIS — J3089 Other allergic rhinitis: Secondary | ICD-10-CM | POA: Diagnosis not present

## 2021-02-15 DIAGNOSIS — J301 Allergic rhinitis due to pollen: Secondary | ICD-10-CM | POA: Diagnosis not present

## 2021-02-15 DIAGNOSIS — J3081 Allergic rhinitis due to animal (cat) (dog) hair and dander: Secondary | ICD-10-CM | POA: Diagnosis not present

## 2021-02-15 LAB — HEMOGLOBIN A1C
Est. average glucose Bld gHb Est-mCnc: 131 mg/dL
Hgb A1c MFr Bld: 6.2 % — ABNORMAL HIGH (ref 4.8–5.6)

## 2021-02-15 LAB — CBC WITH DIFFERENTIAL/PLATELET
Basophils Absolute: 0.1 10*3/uL (ref 0.0–0.2)
Basos: 1 %
EOS (ABSOLUTE): 0.1 10*3/uL (ref 0.0–0.4)
Eos: 2 %
Hematocrit: 36.2 % (ref 34.0–46.6)
Hemoglobin: 11.6 g/dL (ref 11.1–15.9)
Immature Grans (Abs): 0.1 10*3/uL (ref 0.0–0.1)
Immature Granulocytes: 1 %
Lymphocytes Absolute: 1.7 10*3/uL (ref 0.7–3.1)
Lymphs: 19 %
MCH: 27 pg (ref 26.6–33.0)
MCHC: 32 g/dL (ref 31.5–35.7)
MCV: 84 fL (ref 79–97)
Monocytes Absolute: 0.5 10*3/uL (ref 0.1–0.9)
Monocytes: 5 %
Neutrophils Absolute: 6.6 10*3/uL (ref 1.4–7.0)
Neutrophils: 72 %
Platelets: 463 10*3/uL — ABNORMAL HIGH (ref 150–450)
RBC: 4.3 x10E6/uL (ref 3.77–5.28)
RDW: 15.7 % — ABNORMAL HIGH (ref 11.7–15.4)
WBC: 8.9 10*3/uL (ref 3.4–10.8)

## 2021-02-15 LAB — COMPREHENSIVE METABOLIC PANEL
ALT: 10 IU/L (ref 0–32)
AST: 4 IU/L (ref 0–40)
Albumin/Globulin Ratio: 2.1 (ref 1.2–2.2)
Albumin: 4.9 g/dL (ref 3.8–4.9)
Alkaline Phosphatase: 136 IU/L — ABNORMAL HIGH (ref 44–121)
BUN/Creatinine Ratio: 18 (ref 9–23)
BUN: 12 mg/dL (ref 6–24)
Bilirubin Total: 0.5 mg/dL (ref 0.0–1.2)
CO2: 24 mmol/L (ref 20–29)
Calcium: 9.5 mg/dL (ref 8.7–10.2)
Chloride: 101 mmol/L (ref 96–106)
Creatinine, Ser: 0.65 mg/dL (ref 0.57–1.00)
Globulin, Total: 2.3 g/dL (ref 1.5–4.5)
Glucose: 92 mg/dL (ref 65–99)
Potassium: 3.8 mmol/L (ref 3.5–5.2)
Sodium: 144 mmol/L (ref 134–144)
Total Protein: 7.2 g/dL (ref 6.0–8.5)
eGFR: 102 mL/min/{1.73_m2} (ref >59)

## 2021-02-15 LAB — T4, FREE: Free T4: 1.44 ng/dL (ref 0.82–1.77)

## 2021-02-15 LAB — LIPID PANEL
Chol/HDL Ratio: 2 ratio (ref 0.0–4.4)
Cholesterol, Total: 139 mg/dL (ref 100–199)
HDL: 70 mg/dL (ref >39)
LDL Chol Calc (NIH): 56 mg/dL (ref 0–99)
Triglycerides: 66 mg/dL (ref 0–149)
VLDL Cholesterol Cal: 13 mg/dL (ref 5–40)

## 2021-02-15 LAB — IRON,TIBC AND FERRITIN PANEL
Ferritin: 50 ng/mL (ref 15–150)
Iron Saturation: 10 % — ABNORMAL LOW (ref 15–55)
Iron: 34 ug/dL (ref 27–159)
Total Iron Binding Capacity: 329 ug/dL (ref 250–450)
UIBC: 295 ug/dL (ref 131–425)

## 2021-02-15 LAB — MAGNESIUM: Magnesium: 2.2 mg/dL (ref 1.6–2.3)

## 2021-02-15 LAB — TSH: TSH: 0.458 u[IU]/mL (ref 0.450–4.500)

## 2021-02-15 LAB — VITAMIN D 25 HYDROXY (VIT D DEFICIENCY, FRACTURES): Vit D, 25-Hydroxy: 42.6 ng/mL (ref 30.0–100.0)

## 2021-02-15 LAB — VITAMIN B12: Vitamin B-12: 508 pg/mL (ref 232–1245)

## 2021-02-15 LAB — INSULIN, RANDOM: INSULIN: 12.4 u[IU]/mL (ref 2.6–24.9)

## 2021-02-17 DIAGNOSIS — D649 Anemia, unspecified: Secondary | ICD-10-CM | POA: Diagnosis not present

## 2021-02-17 DIAGNOSIS — K219 Gastro-esophageal reflux disease without esophagitis: Secondary | ICD-10-CM | POA: Diagnosis not present

## 2021-02-17 DIAGNOSIS — E7800 Pure hypercholesterolemia, unspecified: Secondary | ICD-10-CM | POA: Diagnosis not present

## 2021-02-17 DIAGNOSIS — E119 Type 2 diabetes mellitus without complications: Secondary | ICD-10-CM | POA: Diagnosis not present

## 2021-02-17 DIAGNOSIS — I10 Essential (primary) hypertension: Secondary | ICD-10-CM | POA: Diagnosis not present

## 2021-02-17 DIAGNOSIS — J4520 Mild intermittent asthma, uncomplicated: Secondary | ICD-10-CM | POA: Diagnosis not present

## 2021-02-18 ENCOUNTER — Ambulatory Visit
Admission: RE | Admit: 2021-02-18 | Discharge: 2021-02-18 | Disposition: A | Discharge status: Home / Self Care | Payer: Medicare HMO | Source: Ambulatory Visit | Attending: Neurological Surgery | Admitting: Neurological Surgery

## 2021-02-18 ENCOUNTER — Other Ambulatory Visit: Payer: Medicare HMO

## 2021-02-18 DIAGNOSIS — G9389 Other specified disorders of brain: Secondary | ICD-10-CM

## 2021-02-18 DIAGNOSIS — G939 Disorder of brain, unspecified: Secondary | ICD-10-CM | POA: Diagnosis not present

## 2021-02-18 DIAGNOSIS — R55 Syncope and collapse: Secondary | ICD-10-CM | POA: Diagnosis not present

## 2021-02-18 DIAGNOSIS — R600 Localized edema: Secondary | ICD-10-CM | POA: Diagnosis not present

## 2021-02-18 MED ORDER — GADOBENATE DIMEGLUMINE 529 MG/ML IV SOLN
20.0000 mL | Freq: Once | INTRAVENOUS | Status: AC | PRN
  Administered 2021-02-18: 20 mL via INTRAVENOUS

## 2021-02-22 NOTE — Progress Notes (Signed)
Chief Complaint:   OBESITY Virginia Luna is here to discuss her progress with her obesity treatment plan along with follow-up of her obesity related diagnoses.   Today's visit was #: 24 Starting weight: 288 lbs Starting date: 08/27/2019 Today's weight: 270 lbs Today's date: 02/14/2021 Weight change since last visit: 11 lbs Total lbs lost to date: 18 lbs Body mass index is 51.02 kg/m.  Total weight loss percentage to date: -6.25%  Interim History: Lashon says that PT has given her a 50% improvement in balance/vertigo.  She is scheduled for an MRI next week. Current Meal Plan: practicing portion control and making smarter food choices, such as increasing vegetables and decreasing simple carbohydrates for 100% of the time.  Current Exercise Plan: Increased walking. Current Anti-Obesity Medications: Ozempic 0.5 mg subcutaneously weekly. Side effects: None.  Assessment/Plan:   Orders Placed This Encounter  Procedures   CBC with Differential/Platelet   Comprehensive metabolic panel   Hemoglobin A1c   Insulin, random   Lipid panel   VITAMIN D 25 Hydroxy (Vit-D Deficiency, Fractures)   Vitamin B12   Iron, TIBC and Ferritin Panel   TSH   T4, free   Magnesium   Ambulatory referral to Physical Therapy   1. Hypertension associated with diabetes ([place]) Elevated today. Medications: Norvasc 5 mg daily.   Plan: Avoid buying foods that are: processed, frozen, or prepackaged to avoid excess salt. We will watch for signs of hypotension as she continues lifestyle modifications.  BP Readings from Last 3 Encounters:  02/14/21 (!) 141/86  02/02/21 (!) 162/98  01/25/21 137/84   Lab Results  Component Value Date   CREATININE 0.65 02/14/2021   2. Type 2 diabetes mellitus with other specified complication, without long-term current use of insulin (HCC) Diabetes Mellitus: Not at goal. Medication: Ozempic 0.5 mg subcutaneously weekly. Issues reviewed: blood sugar goals, complications of  diabetes mellitus, hypoglycemia prevention and treatment, exercise, and nutrition.   Plan: The patient will continue to focus on protein-rich, low simple carbohydrate foods. We reviewed the importance of hydration, regular exercise for stress reduction, and restorative sleep.   Lab Results  Component Value Date   HGBA1C 6.2 (H) 02/14/2021   HGBA1C 6.0 (H) 10/12/2020   HGBA1C 6.4 (H) 02/08/2020   Lab Results  Component Value Date   LDLCALC 56 02/14/2021   CREATININE 0.65 02/14/2021   - Comprehensive metabolic panel - Hemoglobin A1c - Insulin, random  3. Medication management Will check labs today, as per below.  - CBC with Differential/Platelet - Comprehensive metabolic panel - Lipid panel - TSH - T4, free - Magnesium  4. Brain mass She will be having an MRI next week.  5. Chronic vertigo Annessa is currently in PT and is doing well.  6. Vitamin D deficiency Improving, but not optimized.  She is taking vitamin D 50,000 IU weekly.  Plan: Continue to take prescription Vitamin D @50 ,000 IU every week as prescribed.  Will check vitamin D level today.  Lab Results  Component Value Date   VD25OH 42.6 02/14/2021   VD25OH 38.1 10/12/2020   VD25OH 31.1 02/08/2020   - VITAMIN D 25 Hydroxy (Vit-D Deficiency, Fractures)  7. B12 deficiency  Will check vitamin B12 level today, as per below.  - Vitamin B12 - Iron, TIBC and Ferritin Panel  8. Pes planus, bilateral Will refer to Physical Therapy for orthotic devices.  - Ambulatory referral to Physical Therapy  9. Obesity, current BMI 51.2  Course: [name] is currently in the action  stage of change. As such, her goal is to continue with weight loss efforts.   Nutrition goals: She has agreed to practicing portion control and making smarter food choices, such as increasing vegetables and decreasing simple carbohydrates.   Exercise goals:  As is.  Behavioral modification strategies: increasing lean protein intake,  decreasing simple carbohydrates, increasing vegetables, and increasing water intake.  [name] has agreed to follow-up with our clinic in 3 weeks. She was informed of the importance of frequent follow-up visits to maximize her success with intensive lifestyle modifications for her multiple health conditions.   Objective:   Blood pressure (!) 141/86, pulse 94, temperature 97.7 F (36.5 C), temperature source Oral, height 5\' 1"  (1.549 m), weight 270 lb (122.5 kg), SpO2 97 %. Body mass index is 51.02 kg/m.  General: Cooperative, alert, well developed, in no acute distress. HEENT: Conjunctivae and lids unremarkable. Cardiovascular: Regular rhythm.  Lungs: Normal work of breathing. Neurologic: No focal deficits.   Lab Results  Component Value Date   CREATININE 0.65 02/14/2021   BUN 12 02/14/2021   NA 144 02/14/2021   K 3.8 02/14/2021   CL 101 02/14/2021   CO2 24 02/14/2021   Lab Results  Component Value Date   ALT 10 02/14/2021   AST 4 02/14/2021   GGT 75 (H) 02/08/2020   ALKPHOS 136 (H) 02/14/2021   BILITOT 0.5 02/14/2021   Lab Results  Component Value Date   HGBA1C 6.2 (H) 02/14/2021   HGBA1C 6.0 (H) 10/12/2020   HGBA1C 6.4 (H) 02/08/2020   Lab Results  Component Value Date   INSULIN 12.4 02/14/2021   INSULIN 10.9 10/12/2020   INSULIN 18.5 02/08/2020   INSULIN 15.9 08/27/2019   Lab Results  Component Value Date   TSH 0.458 02/14/2021   Lab Results  Component Value Date   CHOL 139 02/14/2021   HDL 70 02/14/2021   LDLCALC 56 02/14/2021   TRIG 66 02/14/2021   CHOLHDL 2.0 02/14/2021   Lab Results  Component Value Date   VD25OH 42.6 02/14/2021   VD25OH 38.1 10/12/2020   VD25OH 31.1 02/08/2020   Lab Results  Component Value Date   WBC 8.9 02/14/2021   HGB 11.6 02/14/2021   HCT 36.2 02/14/2021   MCV 84 02/14/2021   PLT 463 (H) 02/14/2021   Lab Results  Component Value Date   IRON 34 02/14/2021   TIBC 329 02/14/2021   FERRITIN 50 02/14/2021   Obesity  Behavioral Intervention:   Approximately 15 minutes were spent on the discussion below.  ASK: We discussed the diagnosis of obesity with Maurine today and Tarrie agreed to give [name] permission to discuss obesity behavioral modification therapy today.  ASSESS: Bricia has the diagnosis of obesity and her BMI today is 51.2. Jakyiah is in the action stage of change.   ADVISE: Naeema was educated on the multiple health risks of obesity as well as the benefit of weight loss to improve her health. She was advised of the need for long term treatment and the importance of lifestyle modifications to improve her current health and to decrease her risk of future health problems.  AGREE: Multiple dietary modification options and treatment options were discussed and Naelle agreed to follow the recommendations documented in the above note.  ARRANGE: Khiya was educated on the importance of frequent visits to treat obesity as outlined per CMS and USPSTF guidelines and agreed to schedule her next follow up appointment today.  Attestation Statements:   Reviewed by clinician on day  of visit: allergies, medications, problem list, medical history, surgical history, family history, social history, and previous encounter notes.  I, Water quality scientist, CMA, am acting as transcriptionist for Briscoe Deutscher, DO  I have reviewed the above documentation for accuracy and completeness, and I agree with the above. Briscoe Deutscher, DO

## 2021-02-24 ENCOUNTER — Ambulatory Visit: Payer: Medicare HMO | Admitting: Neurology

## 2021-02-24 ENCOUNTER — Other Ambulatory Visit: Payer: Self-pay

## 2021-02-24 DIAGNOSIS — G43719 Chronic migraine without aura, intractable, without status migrainosus: Secondary | ICD-10-CM

## 2021-02-24 MED ORDER — ONABOTULINUMTOXINA 100 UNITS IJ SOLR
155.0000 [IU] | Freq: Once | INTRAMUSCULAR | Status: AC
  Administered 2021-02-24: 155 [IU] via INTRAMUSCULAR

## 2021-02-24 NOTE — Progress Notes (Signed)
Botulinum Clinic   Procedure Note Botox  Attending: Dr. Metta Clines  Preoperative Diagnosis(es): Chronic migraine  Consent obtained from: The patient Benefits discussed included, but were not limited to decreased muscle tightness, increased joint range of motion, and decreased pain.  Risk discussed included, but were not limited pain and discomfort, bleeding, bruising, excessive weakness, venous thrombosis, muscle atrophy and dysphagia.  Anticipated outcomes of the procedure as well as he risks and benefits of the alternatives to the procedure, and the roles and tasks of the personnel to be involved, were discussed with the patient, and the patient consents to the procedure and agrees to proceed. A copy of the patient medication guide was given to the patient which explains the blackbox warning.  Patients identity and treatment sites confirmed Yes.  .  Details of Procedure: Skin was cleaned with alcohol. Prior to injection, the needle plunger was aspirated to make sure the needle was not within a blood vessel.  There was no blood retrieved on aspiration.    Following is a summary of the muscles injected  And the amount of Botulinum toxin used:  Dilution 200 units of Botox was reconstituted with 4 ml of preservative free normal saline. Time of reconstitution: At the time of the office visit (<30 minutes prior to injection)   Injections  155 total units of Botox was injected with a 30 gauge needle.  Injection Sites: L occipitalis: 15 units- 3 sites  R occiptalis: 15 units- 3 sites  L upper trapezius: 15 units- 3 sites R upper trapezius: 15 units- 3 sits          L paraspinal: 10 units- 2 sites R paraspinal: 10 units- 2 sites  Face L frontalis(2 injection sites):10 units   R frontalis(2 injection sites):10 units         L corrugator: 5 units   R corrugator: 5 units           Procerus: 5 units   L temporalis: 20 units R temporalis: 20 units   Agent:  200 units of botulinum Type  A (Onobotulinum Toxin type A) was reconstituted with 4 ml of preservative free normal saline.  Time of reconstitution: At the time of the office visit (<30 minutes prior to injection)     Total injected (Units):  155  Total wasted (Units):  None wasted  Patient tolerated procedure well without complications.   Reinjection is anticipated in 3 months.

## 2021-03-01 DIAGNOSIS — J3089 Other allergic rhinitis: Secondary | ICD-10-CM | POA: Diagnosis not present

## 2021-03-01 DIAGNOSIS — J301 Allergic rhinitis due to pollen: Secondary | ICD-10-CM | POA: Diagnosis not present

## 2021-03-01 DIAGNOSIS — J3081 Allergic rhinitis due to animal (cat) (dog) hair and dander: Secondary | ICD-10-CM | POA: Diagnosis not present

## 2021-03-03 ENCOUNTER — Encounter (INDEPENDENT_AMBULATORY_CARE_PROVIDER_SITE_OTHER): Payer: Self-pay | Admitting: Family Medicine

## 2021-03-06 ENCOUNTER — Ambulatory Visit (INDEPENDENT_AMBULATORY_CARE_PROVIDER_SITE_OTHER): Payer: Medicare HMO | Admitting: Family Medicine

## 2021-03-07 DIAGNOSIS — Z6841 Body Mass Index (BMI) 40.0 and over, adult: Secondary | ICD-10-CM | POA: Insufficient documentation

## 2021-03-07 DIAGNOSIS — G9389 Other specified disorders of brain: Secondary | ICD-10-CM | POA: Diagnosis not present

## 2021-03-09 ENCOUNTER — Other Ambulatory Visit (INDEPENDENT_AMBULATORY_CARE_PROVIDER_SITE_OTHER): Payer: Self-pay | Admitting: Family Medicine

## 2021-03-09 DIAGNOSIS — J301 Allergic rhinitis due to pollen: Secondary | ICD-10-CM

## 2021-03-09 NOTE — Telephone Encounter (Signed)
Last OV with Dr Wallace 

## 2021-03-13 ENCOUNTER — Other Ambulatory Visit (INDEPENDENT_AMBULATORY_CARE_PROVIDER_SITE_OTHER): Payer: Self-pay | Admitting: Family Medicine

## 2021-03-13 DIAGNOSIS — J301 Allergic rhinitis due to pollen: Secondary | ICD-10-CM

## 2021-03-13 NOTE — Telephone Encounter (Signed)
Dr.Wallace °

## 2021-03-15 DIAGNOSIS — J3089 Other allergic rhinitis: Secondary | ICD-10-CM | POA: Diagnosis not present

## 2021-03-15 DIAGNOSIS — J301 Allergic rhinitis due to pollen: Secondary | ICD-10-CM | POA: Diagnosis not present

## 2021-03-16 DIAGNOSIS — I10 Essential (primary) hypertension: Secondary | ICD-10-CM | POA: Diagnosis not present

## 2021-03-21 ENCOUNTER — Encounter (INDEPENDENT_AMBULATORY_CARE_PROVIDER_SITE_OTHER): Payer: Self-pay | Admitting: Family Medicine

## 2021-03-21 ENCOUNTER — Other Ambulatory Visit (INDEPENDENT_AMBULATORY_CARE_PROVIDER_SITE_OTHER): Payer: Self-pay | Admitting: Family Medicine

## 2021-03-21 ENCOUNTER — Other Ambulatory Visit: Payer: Self-pay

## 2021-03-21 ENCOUNTER — Ambulatory Visit (INDEPENDENT_AMBULATORY_CARE_PROVIDER_SITE_OTHER): Payer: Medicare HMO | Admitting: Family Medicine

## 2021-03-21 VITALS — BP 134/81 | HR 97 | Temp 98.8°F | Ht 61.0 in | Wt 271.0 lb

## 2021-03-21 DIAGNOSIS — E1169 Type 2 diabetes mellitus with other specified complication: Secondary | ICD-10-CM

## 2021-03-21 DIAGNOSIS — F5105 Insomnia due to other mental disorder: Secondary | ICD-10-CM

## 2021-03-21 DIAGNOSIS — K5909 Other constipation: Secondary | ICD-10-CM | POA: Diagnosis not present

## 2021-03-21 DIAGNOSIS — R110 Nausea: Secondary | ICD-10-CM

## 2021-03-21 DIAGNOSIS — J301 Allergic rhinitis due to pollen: Secondary | ICD-10-CM

## 2021-03-21 DIAGNOSIS — Z79899 Other long term (current) drug therapy: Secondary | ICD-10-CM

## 2021-03-21 DIAGNOSIS — E538 Deficiency of other specified B group vitamins: Secondary | ICD-10-CM

## 2021-03-21 DIAGNOSIS — R296 Repeated falls: Secondary | ICD-10-CM

## 2021-03-21 DIAGNOSIS — G43809 Other migraine, not intractable, without status migrainosus: Secondary | ICD-10-CM

## 2021-03-21 DIAGNOSIS — E559 Vitamin D deficiency, unspecified: Secondary | ICD-10-CM

## 2021-03-21 DIAGNOSIS — Z6841 Body Mass Index (BMI) 40.0 and over, adult: Secondary | ICD-10-CM

## 2021-03-22 ENCOUNTER — Ambulatory Visit (INDEPENDENT_AMBULATORY_CARE_PROVIDER_SITE_OTHER): Payer: Medicare HMO | Admitting: Physical Therapy

## 2021-03-22 ENCOUNTER — Other Ambulatory Visit: Payer: Self-pay

## 2021-03-22 DIAGNOSIS — M2141 Flat foot [pes planus] (acquired), right foot: Secondary | ICD-10-CM

## 2021-03-22 DIAGNOSIS — M2142 Flat foot [pes planus] (acquired), left foot: Secondary | ICD-10-CM

## 2021-03-22 DIAGNOSIS — M25571 Pain in right ankle and joints of right foot: Secondary | ICD-10-CM

## 2021-03-22 DIAGNOSIS — J3081 Allergic rhinitis due to animal (cat) (dog) hair and dander: Secondary | ICD-10-CM | POA: Diagnosis not present

## 2021-03-22 DIAGNOSIS — M25572 Pain in left ankle and joints of left foot: Secondary | ICD-10-CM

## 2021-03-22 DIAGNOSIS — J3089 Other allergic rhinitis: Secondary | ICD-10-CM | POA: Diagnosis not present

## 2021-03-22 DIAGNOSIS — J301 Allergic rhinitis due to pollen: Secondary | ICD-10-CM | POA: Diagnosis not present

## 2021-03-22 MED ORDER — MONTELUKAST SODIUM 10 MG PO TABS: 10.0000 mg | ORAL_TABLET | Freq: Every day | ORAL | 3 refills | Status: DC

## 2021-03-22 MED ORDER — METHOCARBAMOL 500 MG PO TABS
ORAL_TABLET | ORAL | 0 refills | Status: DC
Start: 2021-03-22 — End: 2021-04-18

## 2021-03-22 MED ORDER — MECLIZINE HCL 25 MG PO TABS: ORAL_TABLET | ORAL | 0 refills | Status: DC

## 2021-03-22 MED ORDER — LEVOCETIRIZINE DIHYDROCHLORIDE 5 MG PO TABS: 5.0000 mg | ORAL_TABLET | Freq: Every evening | ORAL | 0 refills | Status: DC

## 2021-03-22 MED ORDER — VITAMIN D (ERGOCALCIFEROL) 1.25 MG (50000 UNIT) PO CAPS: ORAL_CAPSULE | ORAL | 0 refills | Status: DC

## 2021-03-22 NOTE — Progress Notes (Signed)
Chief Complaint:   OBESITY Virginia Luna is here to discuss her progress with her obesity treatment plan along with follow-up of her obesity related diagnoses. See Medical Weight Management Flowsheet for complete bioelectrical impedance results.  Today's visit was #: 25 Starting weight: 288 lbs Starting date: 08/27/2019 Today's weight: 271 lbs Today's date: 03/21/2021 Weight change since last visit: +1 lb Total lbs lost to date: 17 lbs Body mass index is 51.21 kg/m.  Total weight loss percentage to date: -5.90%  Interim History:  Virginia Luna says she is not resting again.  She is not sleeping well due to pain.  Wakes at 3 am and takes a hot bath due to pain in bilateral knees.  She has had her 8th round of Botox injections and is still having headaches, she reports.    Nutrition Plan: practicing portion control and making smarter food choices, such as increasing vegetables and decreasing simple carbohydrates for 70% of the time. Activity:  Walking for 30 minutes 2 times per week. Anti-obesity medications: Ozempic 0.5 mg subcutaneously weekly. Reported side effects: None.  Assessment/Plan:   1. Insomnia due to mental condition This is poorly controlled. Dysfunction: frequent night time awakening.  Current treatment: Ambien 10 mg at bedtime.  Plan: Recommend sleep hygiene measures including regular sleep schedule, optimal sleep environment, and relaxing presleep rituals.   2. Vitamin D deficiency Improving, but not optimized.  She is taking vitamin D 50,000 IU weekly.  Plan: Continue to take prescription Vitamin D '@50'$ ,000 IU every week as prescribed.  Follow-up for routine testing of Vitamin D, at least 2-3 times per year to avoid over-replacement.  Lab Results  Component Value Date   VD25OH 42.6 02/14/2021   VD25OH 38.1 10/12/2020   VD25OH 31.1 02/08/2020   - Refill Vitamin D, Ergocalciferol, (DRISDOL) 1.25 MG (50000 UNIT) CAPS capsule; TAKE ONE CAPSULE BY MOUTH EVERY 7 DAYS   Dispense: 4 capsule; Refill: 0  3. Chronic constipation This problem is controlled with Linzess 72 mcg daily. The current medical regimen is effective;  continue present plan and medications.  4. Nausea Chronic, being followed by PCP and Neurology.  Will refill meclizine today, as per below.  - Refill meclizine (ANTIVERT) 25 MG tablet; TAKE ONE TABLET BY MOUTH EVERY 8 HOURS AS NEEDED FOR DIZZINESS  Dispense: 30 tablet; Refill: 0  5. Type 2 diabetes mellitus with other specified complication, without long-term current use of insulin (HCC) Diabetes Mellitus: Not at goal. Medication: Ozempic 0.5 mg subcutaneously weekly. Issues reviewed: blood sugar goals, complications of diabetes mellitus, hypoglycemia prevention and treatment, exercise, and nutrition.   Plan: The patient will continue to focus on protein-rich, low simple carbohydrate foods. We reviewed the importance of hydration, regular exercise for stress reduction, and restorative sleep.   Lab Results  Component Value Date   HGBA1C 6.2 (H) 02/14/2021   HGBA1C 6.0 (H) 10/12/2020   HGBA1C 6.4 (H) 02/08/2020   Lab Results  Component Value Date   LDLCALC 56 02/14/2021   CREATININE 0.65 02/14/2021   6. Other migraine without status migrainosus, not intractable Virginia Luna is taking Emgality and gets Botox injections for her migraines.  She is being followed by Neurology.  7. Medication management Medication reconciliation completed.  8. B12 deficiency Lab Results  Component Value Date   VITAMINB12 508 02/14/2021   Supplementation: None.   9. Seasonal allergic rhinitis due to pollen Will refill Singulair and Xyzal today, as per below.  - Refill montelukast (SINGULAIR) 10 MG tablet; Take 1 tablet (  10 mg total) by mouth at bedtime.  Dispense: 30 tablet; Refill: 3 - Refill levocetirizine (XYZAL) 5 MG tablet; Take 1 tablet (5 mg total) by mouth every evening.  Dispense: 90 tablet; Refill: 0  10. Frequent falls Etiology of falls  not yet clear. Precautions discussed.  11. Obesity, current BMI 51.3  Course: Virginia Luna is currently in the action stage of change. As such, her goal is to continue with weight loss efforts.   Nutrition goals: She has agreed to practicing portion control and making smarter food choices, such as increasing vegetables and decreasing simple carbohydrates.   Exercise goals:  As is.  Behavioral modification strategies: increasing lean protein intake, decreasing simple carbohydrates, increasing vegetables, and increasing water intake.  Virginia Luna has agreed to follow-up with our clinic in 2-3 weeks. She was informed of the importance of frequent follow-up visits to maximize her success with intensive lifestyle modifications for her multiple health conditions.   Objective:   Blood pressure 134/81, pulse 97, temperature 98.8 F (37.1 C), temperature source Oral, height '5\' 1"'$  (1.549 m), weight 271 lb (122.9 kg), SpO2 96 %. Body mass index is 51.21 kg/m.  General: Cooperative, alert, well developed, in no acute distress. HEENT: Conjunctivae and lids unremarkable. Cardiovascular: Regular rhythm.  Lungs: Normal work of breathing. Neurologic: No focal deficits.   Lab Results  Component Value Date   CREATININE 0.65 02/14/2021   BUN 12 02/14/2021   NA 144 02/14/2021   K 3.8 02/14/2021   CL 101 02/14/2021   CO2 24 02/14/2021   Lab Results  Component Value Date   ALT 10 02/14/2021   AST 4 02/14/2021   GGT 75 (H) 02/08/2020   ALKPHOS 136 (H) 02/14/2021   BILITOT 0.5 02/14/2021   Lab Results  Component Value Date   HGBA1C 6.2 (H) 02/14/2021   HGBA1C 6.0 (H) 10/12/2020   HGBA1C 6.4 (H) 02/08/2020   Lab Results  Component Value Date   INSULIN 12.4 02/14/2021   INSULIN 10.9 10/12/2020   INSULIN 18.5 02/08/2020   INSULIN 15.9 08/27/2019   Lab Results  Component Value Date   TSH 0.458 02/14/2021   Lab Results  Component Value Date   CHOL 139 02/14/2021   HDL 70 02/14/2021    LDLCALC 56 02/14/2021   TRIG 66 02/14/2021   CHOLHDL 2.0 02/14/2021   Lab Results  Component Value Date   VD25OH 42.6 02/14/2021   VD25OH 38.1 10/12/2020   VD25OH 31.1 02/08/2020   Lab Results  Component Value Date   WBC 8.9 02/14/2021   HGB 11.6 02/14/2021   HCT 36.2 02/14/2021   MCV 84 02/14/2021   PLT 463 (H) 02/14/2021   Lab Results  Component Value Date   IRON 34 02/14/2021   TIBC 329 02/14/2021   FERRITIN 50 02/14/2021   Obesity Behavioral Intervention:   Approximately 15 minutes were spent on the discussion below.  ASK: We discussed the diagnosis of obesity with Virginia Luna today and Virginia Luna agreed to give [name] permission to discuss obesity behavioral modification therapy today.  ASSESS: Virginia Luna has the diagnosis of obesity and her BMI today is 51.3. Virginia Luna is in the action stage of change.   ADVISE: Virginia Luna was educated on the multiple health risks of obesity as well as the benefit of weight loss to improve her health. She was advised of the need for long term treatment and the importance of lifestyle modifications to improve her current health and to decrease her risk of future health problems.  AGREE: Multiple  dietary modification options and treatment options were discussed and Virginia Luna agreed to follow the recommendations documented in the above note.  ARRANGE: Virginia Luna was educated on the importance of frequent visits to treat obesity as outlined per CMS and USPSTF guidelines and agreed to schedule her next follow up appointment today.  Attestation Statements:   Reviewed by clinician on day of visit: allergies, medications, problem list, medical history, surgical history, family history, social history, and previous encounter notes.  I, Water quality scientist, CMA, am acting as transcriptionist for Briscoe Deutscher, DO  I have reviewed the above documentation for accuracy and completeness, and I agree with the above. Briscoe Deutscher, DO

## 2021-03-25 ENCOUNTER — Encounter: Payer: Self-pay | Admitting: Physical Therapy

## 2021-03-25 NOTE — Therapy (Addendum)
[facility] [address] [place], [place], [address] Phone: [phone]   Fax:  [phone]  Physical Therapy Evaluation  Patient Details  Name: Virginia Luna MRN: [id number] Date of Birth: Aug 31, 1960 Referring Provider (PT): Briscoe Deutscher   Encounter Date: 03/22/2021   PT End of Session - 03/25/21 1618     Visit Number 1    Number of Visits 8    Date for PT Re-Evaluation 05/17/21    Authorization Type [employer],    PT Start Time 1600    PT Stop Time 1640    PT Time Calculation (min) 40 min    Activity Tolerance Patient tolerated treatment well    Behavior During Therapy [facility] for tasks assessed/performed             Past Medical History:  Diagnosis Date   Allergies    Anemia    Arthritis    Asthma    Back pain    Chronic pain    Complication of anesthesia    woke up during colonoscopy   Diabetes ([place])    Diabetes mellitus without complication (HCC)    Edema, lower extremity    Fibroid    Fibromyalgia    Chronic   GERD (gastroesophageal reflux disease)    Headache    migraines   High blood pressure    History of hiatal hernia    History of stomach ulcers    IBS (irritable bowel syndrome)    Joint pain    Sleep apnea    did use cpap-lost 25lb-says she does not need it   NO CPAP   Wears contact lenses     Past Surgical History:  Procedure Laterality Date   BREAST BIOPSY     BREAST EXCISIONAL BIOPSY     BREAST LUMPECTOMY WITH RADIOACTIVE SEED LOCALIZATION Bilateral 11/24/2014   Procedure: BILATERAL BREAST LUMPECTOMY WITH RADIOACTIVE SEED LOCALIZATION;  Surgeon: [name], MD;  Location: [facility];  Service: General;  Laterality: Bilateral;   CHOLECYSTECTOMY     COLONOSCOPY     DILATION AND CURETTAGE OF UTERUS     ESOPHAGOGASTRODUODENOSCOPY (EGD) WITH PROPOFOL N/A 07/23/2016   Procedure: ESOPHAGOGASTRODUODENOSCOPY (EGD) WITH PROPOFOL;  Surgeon: Laurence Spates, MD;  Location: [facility];   Service: Endoscopy;  Laterality: N/A;   ESOPHAGOGASTRODUODENOSCOPY (EGD) WITH PROPOFOL N/A 06/11/2018   Procedure: ESOPHAGOGASTRODUODENOSCOPY (EGD) WITH PROPOFOL;  Surgeon: Laurence Spates, MD;  Location: [facility];  Service: Endoscopy;  Laterality: N/A;   LUMBAR LAMINECTOMY  2010   ORIF WRIST FRACTURE Right 11/24/2020   Procedure: OPEN REDUCTION INTERNAL FIXATION RIGHT WRIST FRACTURE;  Surgeon: Renette Butters, MD;  Location: [facility];  Service: Orthopedics;  Laterality: Right;   UMBILICAL HERNIA REPAIR     age 13   UPPER GI ENDOSCOPY      There were no vitals filed for this visit.    Subjective Assessment - 03/25/21 1612     Subjective Pt states ongoing pain in Bil feet. She has worn orthotics in the past, states very flat feet. She also has ongoing bil knee pain, has had PT in the past for this, knees still sore. States a few Falls, did hurt R wrist recently. Pt referred for and  interested in getting new orthotics for foot pain.    Pertinent History DM, Fibromyalgia.    Patient Stated Goals obtain orthotics, decrease foot pain.    Currently in Pain? Yes    Pain Score 5     Pain  Location Foot    Pain Orientation Left;Right    Pain Descriptors / Indicators Aching;Sore    Pain Type Chronic pain    Pain Onset More than a month ago    Pain Frequency Intermittent                [facility] PT Assessment - 03/25/21 0001       Assessment   Medical Diagnosis Foot pain, pes planus    Referring Provider (PT) Erica Wallace    Prior Therapy not for foot pain      Precautions   Precautions Fall      Restrictions   Weight Bearing Restrictions No      Balance Screen   Has the patient fallen in the past 6 months Yes    How many times? 1    Has the patient had a decrease in activity level because of a fear of falling?  Yes    Is the patient reluctant to leave their home because of a fear of falling?  No      Prior Function   Level of Independence Independent      Cognition    Overall Cognitive Status Within Functional Limits for tasks assessed      Posture/Postural Control   Posture Comments significant flat foot bil, increased toe flexion bil .      ROM / Strength   AROM / PROM / Strength AROM;Strength      AROM   Overall AROM Comments ankle: mild limitation for DF bil;      Strength   Overall Strength Comments Foot/ankle: 4-/5,      Palpation   Palpation comment tenderness in bil pl fascia and into base of heel.                        Objective measurements completed on examination: See above findings.       [facility] Adult PT Treatment/Exercise - 03/25/21 0001       Knee/Hip Exercises: Stretches   Gastroc Stretch 30 seconds    Other Knee/Hip Stretches self pl fascia stretch      Knee/Hip Exercises: Seated   Other Seated Knee/Hip Exercises Seated heel raises, with focus on form and toe spreading.                    PT Education - 03/25/21 1618     Education Details PT POC, Exam findings, HEP for foot pain, discussed orthotic process, and optimal footwear for foot pain and orthotic use.    Person(s) Educated Patient    Methods Explanation;Demonstration;Tactile cues;Verbal cues;Handout    Comprehension Verbalized understanding;Returned demonstration;Verbal cues required;Tactile cues required;Need further instruction              PT Short Term Goals - 03/25/21 1625       PT SHORT TERM GOAL #1   Title Patient will be indepenent with basic HEP    Time 2    Period Weeks    Status New    Target Date 04/05/21               PT Long Term Goals - 03/25/21 1626       PT LONG TERM GOAL #1   Title independent with final HEP    Time 8    Period Weeks    Status New    Target Date 05/17/21      PT LONG TERM GOAL #2   Title   Pt to obtain and be complaint with footwear and/or orthotics as appropriate for her foot type.    Time 8    Period Weeks    Status New    Target Date 05/17/21      PT LONG TERM GOAL  #3   Title Pt to report decreased pain in feet to 0-3/10 with standing, walking, at least 30 min.    Time 8    Period Weeks    Status New    Target Date 05/17/21                    Plan - 03/25/21 1630     Clinical Impression Statement Pt presents wtih primary complaint of increased pain in bil feet. Pt with significant flat foot posture bilaterally. She has increased flexion/clawing of all toes bilaterally, and lack of foot strength and stability. Discussed process of orthotics, pt to benefit from orthotics and footwear to improve foot positioning and pain. Pt with sneakers that are too small for foot or to accommodate orthotics. Discussed optimal footwear and fit, pt will obtain new footwear prior to making orthotics. Reviewed HEP today. Pt to benefit from skilled PT to improve foot pain and improve ability for functional activity.    Personal Factors and Comorbidities Past/Current Experience;Time since onset of injury/illness/exacerbation;Comorbidity 1;Fitness    Comorbidities chronic pain, DM, fibromyalgia,    Examination-Activity Limitations Stairs;Stand;Locomotion Level;Transfers;Carry    Examination-Participation Restrictions Community Activity;Shop;Meal Prep;Laundry;Cleaning    Stability/Clinical Decision Making Stable/Uncomplicated    Clinical Decision Making Low    Rehab Potential Good    PT Frequency 1x / week    PT Duration 8 weeks    PT Treatment/Interventions ADLs/Self Care Home Management;Canalith Repostioning;Electrical Stimulation;Moist Heat;Balance training;Therapeutic exercise;Therapeutic activities;Functional mobility training;Stair training;Gait training;Neuromuscular re-education;Patient/family education;Manual techniques;Taping;Cryotherapy;Iontophoresis 4mg/ml Dexamethasone;Ultrasound;DME Instruction;Orthotic Fit/Training;Passive range of motion;Dry needling;Energy conservation;Spinal Manipulations;Joint Manipulations    PT Next Visit Plan --    PT Home  Exercise Plan --    Consulted and Agree with Plan of Care Patient             Patient will benefit from skilled therapeutic intervention in order to improve the following deficits and impairments:  Decreased balance, Decreased mobility, Difficulty walking, Postural dysfunction, Abnormal gait, Pain, Increased muscle spasms, Decreased activity tolerance, Decreased strength, Decreased range of motion, Impaired flexibility  Visit Diagnosis: Pain in left ankle and joints of left foot  Pain in right ankle and joints of right foot  Pes planus of both feet     Problem List Patient Active Problem List   Diagnosis Date Noted   Allergic rhinitis 10/06/2020   Allergic rhinitis due to animal (cat) (dog) hair and dander 10/06/2020   Allergic rhinitis due to pollen 10/06/2020   Moderate persistent asthma, uncomplicated 10/06/2020   Brain mass 10/06/2020   Paresthesia 05/27/2020   Diabetes mellitus (HCC) 05/25/2020   Hypertension associated with diabetes (HCC) 05/25/2020   Hyperlipidemia associated with type 2 diabetes mellitus (HCC) 05/25/2020   Vitamin D deficiency 05/25/2020   NAFLD (nonalcoholic fatty liver disease) 05/25/2020   Sciatica of left side 05/25/2020    , PT, DPT 7:09 PM  03/25/21   [facility] [address] [place], [place], [address] Phone: [phone]   Fax:  [phone]  Name: Riot A Boule MRN: [id number] Date of Birth: 09/12/1960    PHYSICAL THERAPY DISCHARGE SUMMARY  Visits from Start of Care: 1  Plan: Patient agrees to discharge.  Patient goals were not   met. Patient is being discharged due to - not returning since last visit.     , PT, DPT 10:54 AM  08/23/21     

## 2021-03-29 DIAGNOSIS — J3089 Other allergic rhinitis: Secondary | ICD-10-CM | POA: Diagnosis not present

## 2021-03-29 DIAGNOSIS — J301 Allergic rhinitis due to pollen: Secondary | ICD-10-CM | POA: Diagnosis not present

## 2021-03-29 DIAGNOSIS — J3081 Allergic rhinitis due to animal (cat) (dog) hair and dander: Secondary | ICD-10-CM | POA: Diagnosis not present

## 2021-04-05 DIAGNOSIS — J3089 Other allergic rhinitis: Secondary | ICD-10-CM | POA: Diagnosis not present

## 2021-04-05 DIAGNOSIS — J3081 Allergic rhinitis due to animal (cat) (dog) hair and dander: Secondary | ICD-10-CM | POA: Diagnosis not present

## 2021-04-05 DIAGNOSIS — J301 Allergic rhinitis due to pollen: Secondary | ICD-10-CM | POA: Diagnosis not present

## 2021-04-12 DIAGNOSIS — J3081 Allergic rhinitis due to animal (cat) (dog) hair and dander: Secondary | ICD-10-CM | POA: Diagnosis not present

## 2021-04-12 DIAGNOSIS — J301 Allergic rhinitis due to pollen: Secondary | ICD-10-CM | POA: Diagnosis not present

## 2021-04-12 DIAGNOSIS — J3089 Other allergic rhinitis: Secondary | ICD-10-CM | POA: Diagnosis not present

## 2021-04-13 ENCOUNTER — Other Ambulatory Visit (INDEPENDENT_AMBULATORY_CARE_PROVIDER_SITE_OTHER): Payer: Self-pay | Admitting: Family Medicine

## 2021-04-13 ENCOUNTER — Encounter: Payer: Medicare HMO | Admitting: Physical Therapy

## 2021-04-13 DIAGNOSIS — R110 Nausea: Secondary | ICD-10-CM

## 2021-04-13 DIAGNOSIS — Z79899 Other long term (current) drug therapy: Secondary | ICD-10-CM

## 2021-04-13 DIAGNOSIS — E559 Vitamin D deficiency, unspecified: Secondary | ICD-10-CM

## 2021-04-13 DIAGNOSIS — K5909 Other constipation: Secondary | ICD-10-CM

## 2021-04-13 DIAGNOSIS — G43809 Other migraine, not intractable, without status migrainosus: Secondary | ICD-10-CM

## 2021-04-13 DIAGNOSIS — E538 Deficiency of other specified B group vitamins: Secondary | ICD-10-CM

## 2021-04-13 DIAGNOSIS — E1169 Type 2 diabetes mellitus with other specified complication: Secondary | ICD-10-CM

## 2021-04-13 DIAGNOSIS — F5105 Insomnia due to other mental disorder: Secondary | ICD-10-CM

## 2021-04-13 NOTE — Telephone Encounter (Signed)
Pt last seen by Dr. Wallace.  

## 2021-04-14 ENCOUNTER — Other Ambulatory Visit (INDEPENDENT_AMBULATORY_CARE_PROVIDER_SITE_OTHER): Payer: Self-pay | Admitting: Family Medicine

## 2021-04-14 DIAGNOSIS — I10 Essential (primary) hypertension: Secondary | ICD-10-CM | POA: Diagnosis not present

## 2021-04-14 DIAGNOSIS — E7800 Pure hypercholesterolemia, unspecified: Secondary | ICD-10-CM | POA: Diagnosis not present

## 2021-04-14 DIAGNOSIS — J4520 Mild intermittent asthma, uncomplicated: Secondary | ICD-10-CM | POA: Diagnosis not present

## 2021-04-14 DIAGNOSIS — E119 Type 2 diabetes mellitus without complications: Secondary | ICD-10-CM | POA: Diagnosis not present

## 2021-04-14 DIAGNOSIS — D649 Anemia, unspecified: Secondary | ICD-10-CM | POA: Diagnosis not present

## 2021-04-14 DIAGNOSIS — K219 Gastro-esophageal reflux disease without esophagitis: Secondary | ICD-10-CM | POA: Diagnosis not present

## 2021-04-14 DIAGNOSIS — Z79899 Other long term (current) drug therapy: Secondary | ICD-10-CM

## 2021-04-18 ENCOUNTER — Encounter (INDEPENDENT_AMBULATORY_CARE_PROVIDER_SITE_OTHER): Payer: Self-pay | Admitting: Family Medicine

## 2021-04-18 ENCOUNTER — Ambulatory Visit (INDEPENDENT_AMBULATORY_CARE_PROVIDER_SITE_OTHER): Payer: Medicare HMO | Admitting: Family Medicine

## 2021-04-18 ENCOUNTER — Other Ambulatory Visit: Payer: Self-pay

## 2021-04-18 ENCOUNTER — Other Ambulatory Visit (INDEPENDENT_AMBULATORY_CARE_PROVIDER_SITE_OTHER): Payer: Self-pay | Admitting: Family Medicine

## 2021-04-18 VITALS — BP 106/72 | HR 100 | Temp 97.8°F | Ht 61.0 in | Wt 268.0 lb

## 2021-04-18 DIAGNOSIS — G43809 Other migraine, not intractable, without status migrainosus: Secondary | ICD-10-CM | POA: Diagnosis not present

## 2021-04-18 DIAGNOSIS — G8929 Other chronic pain: Secondary | ICD-10-CM | POA: Diagnosis not present

## 2021-04-18 DIAGNOSIS — E1169 Type 2 diabetes mellitus with other specified complication: Secondary | ICD-10-CM

## 2021-04-18 DIAGNOSIS — E559 Vitamin D deficiency, unspecified: Secondary | ICD-10-CM

## 2021-04-18 DIAGNOSIS — K5909 Other constipation: Secondary | ICD-10-CM

## 2021-04-18 DIAGNOSIS — E66813 Obesity, class 3: Secondary | ICD-10-CM

## 2021-04-18 DIAGNOSIS — Z6841 Body Mass Index (BMI) 40.0 and over, adult: Secondary | ICD-10-CM | POA: Diagnosis not present

## 2021-04-18 DIAGNOSIS — Z79899 Other long term (current) drug therapy: Secondary | ICD-10-CM

## 2021-04-18 DIAGNOSIS — R110 Nausea: Secondary | ICD-10-CM

## 2021-04-18 DIAGNOSIS — F5105 Insomnia due to other mental disorder: Secondary | ICD-10-CM

## 2021-04-18 DIAGNOSIS — E538 Deficiency of other specified B group vitamins: Secondary | ICD-10-CM

## 2021-04-18 DIAGNOSIS — I10 Essential (primary) hypertension: Secondary | ICD-10-CM | POA: Diagnosis not present

## 2021-04-18 MED ORDER — METHOCARBAMOL 500 MG PO TABS: ORAL_TABLET | ORAL | 0 refills | Status: DC

## 2021-04-18 MED ORDER — ONDANSETRON 4 MG PO TBDP: 4.0000 mg | ORAL_TABLET | Freq: Four times a day (QID) | ORAL | 0 refills | Status: DC | PRN

## 2021-04-18 MED ORDER — VITAMIN D (ERGOCALCIFEROL) 1.25 MG (50000 UNIT) PO CAPS: ORAL_CAPSULE | ORAL | 0 refills | Status: DC

## 2021-04-18 MED ORDER — MECLIZINE HCL 25 MG PO TABS: ORAL_TABLET | ORAL | 0 refills | Status: DC

## 2021-04-18 MED ORDER — BLOOD GLUCOSE METER KIT: PACK | 0 refills | Status: DC

## 2021-04-18 MED ORDER — OZEMPIC (0.25 OR 0.5 MG/DOSE) 2 MG/1.5ML ~~LOC~~ SOPN: 0.5000 mg | PEN_INJECTOR | SUBCUTANEOUS | 0 refills | Status: DC

## 2021-04-18 NOTE — Telephone Encounter (Signed)
Dr.Wallace °

## 2021-04-19 ENCOUNTER — Encounter (INDEPENDENT_AMBULATORY_CARE_PROVIDER_SITE_OTHER): Payer: Self-pay

## 2021-04-19 DIAGNOSIS — J3081 Allergic rhinitis due to animal (cat) (dog) hair and dander: Secondary | ICD-10-CM | POA: Diagnosis not present

## 2021-04-19 DIAGNOSIS — J3089 Other allergic rhinitis: Secondary | ICD-10-CM | POA: Diagnosis not present

## 2021-04-19 DIAGNOSIS — J301 Allergic rhinitis due to pollen: Secondary | ICD-10-CM | POA: Diagnosis not present

## 2021-04-19 NOTE — Progress Notes (Signed)
Chief Complaint:   OBESITY Virginia Luna is here to discuss her progress with her obesity treatment plan along with follow-up of her obesity related diagnoses.   Today's visit was #: 91 Starting weight: 288 lbs Starting date: 08/27/2019 Today's weight: 268 lbs Today's date: 04/18/2021 Weight change since last visit: 3 lbs Total lbs lost to date: 20 lbs Body mass index is 50.64 kg/m.  Total weight loss percentage to date: -6.94%  Current Meal Plan: practicing portion control and making smarter food choices, such as increasing vegetables and decreasing simple carbohydrates for 70% of the time.  Current Exercise Plan: Increased walking. Current Anti-Obesity Medications: Ozempic 0.5 mg subcutaneously weekly. Side effects: None.  Interim History:  Virginia Luna says that her pain and poor sleep are worsening.  Assessment/Plan:   1. Other chronic pain Presyncope, head hurting (focused at left eye), leg shaking, hands shaking.  Seems to be associated with high pain levels and lasts for a few minutes.  She previously saw a person in [facility] for pain.  She has bilateral hip bursitis as well.  Plan:  Will place referral to Pain Clinic today along with glucometer and supplies to check blood sugar.  - Ambulatory referral to Pain Clinic  2. Other migraine without status migrainosus, not intractable She is getting Botox injections for migraines.  She also has Maxalt for abortive care. Followed by Neurology/Neurosurgery.  3. Insomnia due to mental condition Virginia Luna says she is still not sleeping.  She is prescribed Seroquel 300 mg and Ambien 10 mg at bedtime by Dr. Clovis Pu.  She has not discussed her sleep with Dr. Loretta Plume.  She saw Dr. Oletta Lamas (now retired) commented on difficulty with sedation for EGD/colonoscopy.  4. Vitamin D deficiency Improving, but not optimized.  She is taking vitamin D 50,000 IU weekly.  Plan: Continue to take prescription Vitamin D _0 ,000 IU every week as prescribed.   Follow-up for routine testing of Vitamin D, at least 2-3 times per year to avoid over-replacement.  Lab Results  Component Value Date   VD25OH 42.6 02/14/2021   VD25OH 38.1 10/12/2020   VD25OH 31.1 02/08/2020   - Refill Vitamin D, Ergocalciferol, (DRISDOL) 1.25 MG (50000 UNIT) CAPS capsule; TAKE ONE CAPSULE BY MOUTH EVERY 7 DAYS  Dispense: 4 capsule; Refill: 0  5. Chronic constipation This problem is controlled with Linzess 72 mcg daily. We will continue to monitor symptoms as they relate to her weight loss journey.  6. Nausea Chronic, being followed by PCP and Neurology.  Will refill meclizine and Zofran today, as per below.  - Refill ondansetron (ZOFRAN ODT) 4 MG disintegrating tablet; Take 1 tablet (4 mg total) by mouth every 6 (six) hours as needed for nausea or vomiting.  Dispense: 20 tablet; Refill: 0 - Refill meclizine (ANTIVERT) 25 MG tablet; TAKE ONE TABLET BY MOUTH EVERY 8 HOURS AS NEEDED FOR DIZZINESS  Dispense: 30 tablet; Refill: 0  7. Type 2 diabetes mellitus with other specified complication, without long-term current use of insulin (HCC) Diabetes Mellitus: Not at goal. Medication: Ozempic 0.5 mg subcutaneously weekly.   Plan:  Will refill Ozempic today and send in glucometer and supplies to check blood sugar.  The patient will continue to focus on protein-rich, low simple carbohydrate foods. We reviewed the importance of hydration, regular exercise for stress reduction, and restorative sleep.   Lab Results  Component Value Date   HGBA1C 6.2 (H) 02/14/2021   HGBA1C 6.0 (H) 10/12/2020   HGBA1C 6.4 (H) 02/08/2020  Lab Results  Component Value Date   LDLCALC 56 02/14/2021   CREATININE 0.65 02/14/2021   - Refill Semaglutide,0.25 or 0.5MG /DOS, (OZEMPIC, 0.25 OR 0.5 MG/DOSE,) 2 MG/1.5ML SOPN; Inject 0.5 mg into the skin once a week.  Dispense: 1.5 mL; Refill: 0 - blood glucose meter kit and supplies; Dispense based on patient and insurance preference. Use up to four times  daily as directed. (FOR ICD-10 E10.9, E11.9).  Dispense: 1 each; Refill: 0  8. Medication management Will refill Robaxin 500 mg every 8 hours as needed for muscle spasms, as per below.  - Refill methocarbamol (ROBAXIN) 500 MG tablet; TAKE ONE TABLET BY MOUTH every EIGHT hours AS NEEDED FOR MUSCLE SPASMS  Dispense: 20 tablet; Refill: 0  9. B12 deficiency Lab Results  Component Value Date   VITAMINB12 508 02/14/2021   Supplementation: None.   10. Obesity, current BMI 50.8  Course: Virginia Luna is currently in the action stage of change. As such, her goal is to continue with weight loss efforts.   Nutrition goals: She has agreed to practicing portion control and making smarter food choices, such as increasing vegetables and decreasing simple carbohydrates.   Exercise goals:  As is.  Behavioral modification strategies: increasing lean protein intake, decreasing simple carbohydrates, increasing vegetables, and increasing water intake.  Virginia Luna has agreed to follow-up with our clinic in 2-3 weeks. She was informed of the importance of frequent follow-up visits to maximize her success with intensive lifestyle modifications for her multiple health conditions.   Objective:   Blood pressure 106/72, pulse 100, temperature 97.8 F (36.6 C), temperature source Oral, height 5\' 1"  (1.549 m), weight 268 lb (121.6 kg), SpO2 97 %. Body mass index is 50.64 kg/m.  General: Cooperative, alert, well developed, in no acute distress. HEENT: Conjunctivae and lids unremarkable. Cardiovascular: Regular rhythm.  Lungs: Normal work of breathing. Neurologic: No focal deficits.   Lab Results  Component Value Date   CREATININE 0.65 02/14/2021   BUN 12 02/14/2021   NA 144 02/14/2021   K 3.8 02/14/2021   CL 101 02/14/2021   CO2 24 02/14/2021   Lab Results  Component Value Date   ALT 10 02/14/2021   AST 4 02/14/2021   GGT 75 (H) 02/08/2020   ALKPHOS 136 (H) 02/14/2021   BILITOT 0.5 02/14/2021   Lab  Results  Component Value Date   HGBA1C 6.2 (H) 02/14/2021   HGBA1C 6.0 (H) 10/12/2020   HGBA1C 6.4 (H) 02/08/2020   Lab Results  Component Value Date   INSULIN 12.4 02/14/2021   INSULIN 10.9 10/12/2020   INSULIN 18.5 02/08/2020   INSULIN 15.9 08/27/2019   Lab Results  Component Value Date   TSH 0.458 02/14/2021   Lab Results  Component Value Date   CHOL 139 02/14/2021   HDL 70 02/14/2021   LDLCALC 56 02/14/2021   TRIG 66 02/14/2021   CHOLHDL 2.0 02/14/2021   Lab Results  Component Value Date   VD25OH 42.6 02/14/2021   VD25OH 38.1 10/12/2020   VD25OH 31.1 02/08/2020   Lab Results  Component Value Date   WBC 8.9 02/14/2021   HGB 11.6 02/14/2021   HCT 36.2 02/14/2021   MCV 84 02/14/2021   PLT 463 (H) 02/14/2021   Lab Results  Component Value Date   IRON 34 02/14/2021   TIBC 329 02/14/2021   FERRITIN 50 02/14/2021   Obesity Behavioral Intervention:   Approximately 15 minutes were spent on the discussion below.  ASK: We discussed the diagnosis of obesity  with Virginia Luna today and Virginia Luna agreed to give [name] permission to discuss obesity behavioral modification therapy today.  ASSESS: Virginia Luna has the diagnosis of obesity and her BMI today is 50.8. Virginia Luna is in the action stage of change.   ADVISE: Dailin was educated on the multiple health risks of obesity as well as the benefit of weight loss to improve her health. She was advised of the need for long term treatment and the importance of lifestyle modifications to improve her current health and to decrease her risk of future health problems.  AGREE: Multiple dietary modification options and treatment options were discussed and Virginia Luna agreed to follow the recommendations documented in the above note.  ARRANGE: Virginia Luna was educated on the importance of frequent visits to treat obesity as outlined per CMS and USPSTF guidelines and agreed to schedule her next follow up appointment today.  Attestation Statements:    Reviewed by clinician on day of visit: allergies, medications, problem list, medical history, surgical history, family history, social history, and previous encounter notes.  I, Water quality scientist, CMA, am acting as transcriptionist for Briscoe Deutscher, DO  I have reviewed the above documentation for accuracy and completeness, and I agree with the above. Briscoe Deutscher, DO

## 2021-04-26 DIAGNOSIS — J301 Allergic rhinitis due to pollen: Secondary | ICD-10-CM | POA: Diagnosis not present

## 2021-04-26 DIAGNOSIS — J3089 Other allergic rhinitis: Secondary | ICD-10-CM | POA: Diagnosis not present

## 2021-04-26 DIAGNOSIS — J3081 Allergic rhinitis due to animal (cat) (dog) hair and dander: Secondary | ICD-10-CM | POA: Diagnosis not present

## 2021-04-27 ENCOUNTER — Encounter: Payer: Self-pay | Admitting: Physical Medicine and Rehabilitation

## 2021-05-02 DIAGNOSIS — E7800 Pure hypercholesterolemia, unspecified: Secondary | ICD-10-CM | POA: Diagnosis not present

## 2021-05-02 DIAGNOSIS — K219 Gastro-esophageal reflux disease without esophagitis: Secondary | ICD-10-CM | POA: Diagnosis not present

## 2021-05-02 DIAGNOSIS — Z0000 Encounter for general adult medical examination without abnormal findings: Secondary | ICD-10-CM | POA: Diagnosis not present

## 2021-05-02 DIAGNOSIS — E119 Type 2 diabetes mellitus without complications: Secondary | ICD-10-CM | POA: Diagnosis not present

## 2021-05-02 DIAGNOSIS — M797 Fibromyalgia: Secondary | ICD-10-CM | POA: Diagnosis not present

## 2021-05-02 DIAGNOSIS — G894 Chronic pain syndrome: Secondary | ICD-10-CM | POA: Diagnosis not present

## 2021-05-02 DIAGNOSIS — I10 Essential (primary) hypertension: Secondary | ICD-10-CM | POA: Diagnosis not present

## 2021-05-02 DIAGNOSIS — M7061 Trochanteric bursitis, right hip: Secondary | ICD-10-CM | POA: Diagnosis not present

## 2021-05-02 DIAGNOSIS — Z23 Encounter for immunization: Secondary | ICD-10-CM | POA: Diagnosis not present

## 2021-05-02 DIAGNOSIS — J4520 Mild intermittent asthma, uncomplicated: Secondary | ICD-10-CM | POA: Diagnosis not present

## 2021-05-03 DIAGNOSIS — J3089 Other allergic rhinitis: Secondary | ICD-10-CM | POA: Diagnosis not present

## 2021-05-03 DIAGNOSIS — J3081 Allergic rhinitis due to animal (cat) (dog) hair and dander: Secondary | ICD-10-CM | POA: Diagnosis not present

## 2021-05-03 DIAGNOSIS — J301 Allergic rhinitis due to pollen: Secondary | ICD-10-CM | POA: Diagnosis not present

## 2021-05-04 ENCOUNTER — Other Ambulatory Visit: Payer: Self-pay

## 2021-05-04 ENCOUNTER — Ambulatory Visit (INDEPENDENT_AMBULATORY_CARE_PROVIDER_SITE_OTHER): Payer: Medicare HMO | Admitting: Family Medicine

## 2021-05-04 ENCOUNTER — Encounter (INDEPENDENT_AMBULATORY_CARE_PROVIDER_SITE_OTHER): Payer: Self-pay | Admitting: Family Medicine

## 2021-05-04 VITALS — BP 127/81 | HR 101 | Temp 98.1°F | Ht 61.0 in | Wt 269.0 lb

## 2021-05-04 DIAGNOSIS — G8929 Other chronic pain: Secondary | ICD-10-CM | POA: Diagnosis not present

## 2021-05-04 DIAGNOSIS — F5105 Insomnia due to other mental disorder: Secondary | ICD-10-CM | POA: Diagnosis not present

## 2021-05-04 DIAGNOSIS — E1169 Type 2 diabetes mellitus with other specified complication: Secondary | ICD-10-CM | POA: Diagnosis not present

## 2021-05-04 DIAGNOSIS — G43809 Other migraine, not intractable, without status migrainosus: Secondary | ICD-10-CM | POA: Diagnosis not present

## 2021-05-04 DIAGNOSIS — Z6841 Body Mass Index (BMI) 40.0 and over, adult: Secondary | ICD-10-CM | POA: Diagnosis not present

## 2021-05-04 MED ORDER — GABAPENTIN 300 MG PO CAPS: 300.0000 mg | ORAL_CAPSULE | Freq: Two times a day (BID) | ORAL | 0 refills | Status: DC

## 2021-05-05 NOTE — Progress Notes (Signed)
Chief Complaint:   OBESITY Virginia Luna is here to discuss her progress with her obesity treatment plan along with follow-up of her obesity related diagnoses.   Today's visit was #: 26 Starting weight: 288 lbs Starting date: 08/27/2019 Today's weight: 269 lbs Today's date: 05/04/2021 Weight change since last visit: +1 lb Total lbs lost to date: 19 lbs Body mass index is 50.83 kg/m.  Total weight loss percentage to date: -6.60%  Current Meal Plan: practicing portion control and making smarter food choices, such as increasing vegetables and decreasing simple carbohydrates for 60% of the time.  Current Exercise Plan: Walking for 30 minutes 3-4 times per week. Current Anti-Obesity Medications: Ozempic 0.5 mg subcutaneously weekly. Side effects: None.  Interim History:  [name] has an appointment with the pain clinic on November 14th.  She says she has had 1 episode of a near fall since her last visit.  She will be seeing Dr. Marlou Sa on October 3rd for her knee.  She endorses vomiting x 1 due to pain.  Assessment/Plan:   1. Other chronic pain [name] has an upcoming appointment with the pain clinic.  Plan:  Gabapentin 300 mg with breakfast and lunch.  - Start gabapentin (NEURONTIN) 300 MG capsule; Take 1 capsule (300 mg total) by mouth 2 (two) times daily with breakfast and lunch.  Dispense: 60 capsule; Refill: 0  2. Other migraine without status migrainosus, not intractable She is getting Botox injections for migraines.  She also has Maxalt for abortive care.  3. Insomnia due to mental condition [name] says she is still not sleeping.  She is prescribed Seroquel 300 mg and Ambien 10 mg at bedtime by Dr. Clovis Pu.  She has not discussed her sleep with Dr. Loretta Plume.    4. Type 2 diabetes mellitus with other specified complication, without long-term current use of insulin (HCC) Diabetes Mellitus: Not at goal. Medication: Ozempic 0.5 mg subcutaneously weekly. Issues reviewed: blood sugar  goals, complications of diabetes mellitus, hypoglycemia prevention and treatment, exercise, and nutrition.   Plan: The patient will continue to focus on protein-rich, low simple carbohydrate foods. We reviewed the importance of hydration, regular exercise for stress reduction, and restorative sleep.   Lab Results  Component Value Date   HGBA1C 6.2 (H) 02/14/2021   HGBA1C 6.0 (H) 10/12/2020   HGBA1C 6.4 (H) 02/08/2020   Lab Results  Component Value Date   LDLCALC 56 02/14/2021   CREATININE 0.65 02/14/2021   5. Obesity, current BMI 50.9  Course: [name] is currently in the action stage of change. As such, her goal is to continue with weight loss efforts.   Nutrition goals: She has agreed to practicing portion control and making smarter food choices, such as increasing vegetables and decreasing simple carbohydrates.   Exercise goals:  As is.  Behavioral modification strategies: increasing lean protein intake, decreasing simple carbohydrates, increasing vegetables, and increasing water intake.  [name] has agreed to follow-up with our clinic in 4 weeks. She was informed of the importance of frequent follow-up visits to maximize her success with intensive lifestyle modifications for her multiple health conditions.   Objective:   Blood pressure 127/81, pulse (!) 101, temperature 98.1 F (36.7 C), temperature source Oral, height '5\' 1"'$  (1.549 m), weight 269 lb (122 kg), SpO2 94 %. Body mass index is 50.83 kg/m.  General: Cooperative, alert, well developed, in no acute distress. HEENT: Conjunctivae and lids unremarkable. Cardiovascular: Regular rhythm.  Lungs: Normal work of breathing. Neurologic: No focal deficits.   Lab  Results  Component Value Date   CREATININE 0.65 02/14/2021   BUN 12 02/14/2021   NA 144 02/14/2021   K 3.8 02/14/2021   CL 101 02/14/2021   CO2 24 02/14/2021   Lab Results  Component Value Date   ALT 10 02/14/2021   AST 4 02/14/2021   GGT 75 (H)  02/08/2020   ALKPHOS 136 (H) 02/14/2021   BILITOT 0.5 02/14/2021   Lab Results  Component Value Date   HGBA1C 6.2 (H) 02/14/2021   HGBA1C 6.0 (H) 10/12/2020   HGBA1C 6.4 (H) 02/08/2020   Lab Results  Component Value Date   INSULIN 12.4 02/14/2021   INSULIN 10.9 10/12/2020   INSULIN 18.5 02/08/2020   INSULIN 15.9 08/27/2019   Lab Results  Component Value Date   TSH 0.458 02/14/2021   Lab Results  Component Value Date   CHOL 139 02/14/2021   HDL 70 02/14/2021   LDLCALC 56 02/14/2021   TRIG 66 02/14/2021   CHOLHDL 2.0 02/14/2021   Lab Results  Component Value Date   VD25OH 42.6 02/14/2021   VD25OH 38.1 10/12/2020   VD25OH 31.1 02/08/2020   Lab Results  Component Value Date   WBC 8.9 02/14/2021   HGB 11.6 02/14/2021   HCT 36.2 02/14/2021   MCV 84 02/14/2021   PLT 463 (H) 02/14/2021   Lab Results  Component Value Date   IRON 34 02/14/2021   TIBC 329 02/14/2021   FERRITIN 50 02/14/2021   Obesity Behavioral Intervention:   Approximately 15 minutes were spent on the discussion below.  ASK: We discussed the diagnosis of obesity with [name] today and [name] agreed to give [name] permission to discuss obesity behavioral modification therapy today.  ASSESS: Antha has the diagnosis of obesity and her BMI today is 50.9. Chasty is in the action stage of change.   ADVISE: [name] was educated on the multiple health risks of obesity as well as the benefit of weight loss to improve her health. She was advised of the need for long term treatment and the importance of lifestyle modifications to improve her current health and to decrease her risk of future health problems.  AGREE: Multiple dietary modification options and treatment options were discussed and [name] agreed to follow the recommendations documented in the above note.  ARRANGE: [name] was educated on the importance of frequent visits to treat obesity as outlined per CMS and USPSTF guidelines and agreed to  schedule her next follow up appointment today.  Attestation Statements:   Reviewed by clinician on day of visit: allergies, medications, problem list, medical history, surgical history, family history, social history, and previous encounter notes.  I, Water quality scientist, CMA, am acting as transcriptionist for Briscoe Deutscher, DO  I have reviewed the above documentation for accuracy and completeness, and I agree with the above. Briscoe Deutscher, DO

## 2021-05-11 ENCOUNTER — Telehealth (INDEPENDENT_AMBULATORY_CARE_PROVIDER_SITE_OTHER): Payer: Self-pay | Admitting: Family Medicine

## 2021-05-11 DIAGNOSIS — J3081 Allergic rhinitis due to animal (cat) (dog) hair and dander: Secondary | ICD-10-CM | POA: Diagnosis not present

## 2021-05-11 DIAGNOSIS — J301 Allergic rhinitis due to pollen: Secondary | ICD-10-CM | POA: Diagnosis not present

## 2021-05-11 DIAGNOSIS — J3089 Other allergic rhinitis: Secondary | ICD-10-CM | POA: Diagnosis not present

## 2021-05-11 NOTE — Telephone Encounter (Signed)
[facility] called to say they received the referral back in August however, they need the completed office notes to move forward with this referral.

## 2021-05-12 DIAGNOSIS — I10 Essential (primary) hypertension: Secondary | ICD-10-CM | POA: Diagnosis not present

## 2021-05-12 DIAGNOSIS — D649 Anemia, unspecified: Secondary | ICD-10-CM | POA: Diagnosis not present

## 2021-05-12 DIAGNOSIS — K219 Gastro-esophageal reflux disease without esophagitis: Secondary | ICD-10-CM | POA: Diagnosis not present

## 2021-05-12 DIAGNOSIS — J4520 Mild intermittent asthma, uncomplicated: Secondary | ICD-10-CM | POA: Diagnosis not present

## 2021-05-12 DIAGNOSIS — E7800 Pure hypercholesterolemia, unspecified: Secondary | ICD-10-CM | POA: Diagnosis not present

## 2021-05-12 DIAGNOSIS — E119 Type 2 diabetes mellitus without complications: Secondary | ICD-10-CM | POA: Diagnosis not present

## 2021-05-15 NOTE — Telephone Encounter (Signed)
Msg sent to [facility] notifying them

## 2021-05-16 ENCOUNTER — Other Ambulatory Visit (INDEPENDENT_AMBULATORY_CARE_PROVIDER_SITE_OTHER): Payer: Self-pay | Admitting: Family Medicine

## 2021-05-16 ENCOUNTER — Encounter (INDEPENDENT_AMBULATORY_CARE_PROVIDER_SITE_OTHER): Payer: Self-pay

## 2021-05-16 DIAGNOSIS — R110 Nausea: Secondary | ICD-10-CM

## 2021-05-16 DIAGNOSIS — Z79899 Other long term (current) drug therapy: Secondary | ICD-10-CM

## 2021-05-16 NOTE — Telephone Encounter (Signed)
Msg sent to pt 

## 2021-05-17 DIAGNOSIS — J301 Allergic rhinitis due to pollen: Secondary | ICD-10-CM | POA: Diagnosis not present

## 2021-05-17 DIAGNOSIS — J3089 Other allergic rhinitis: Secondary | ICD-10-CM | POA: Diagnosis not present

## 2021-05-17 DIAGNOSIS — J3081 Allergic rhinitis due to animal (cat) (dog) hair and dander: Secondary | ICD-10-CM | POA: Diagnosis not present

## 2021-05-24 ENCOUNTER — Ambulatory Visit: Payer: Self-pay

## 2021-05-24 ENCOUNTER — Other Ambulatory Visit: Payer: Self-pay

## 2021-05-24 ENCOUNTER — Ambulatory Visit: Payer: Medicare HMO | Admitting: Orthopedic Surgery

## 2021-05-24 DIAGNOSIS — M170 Bilateral primary osteoarthritis of knee: Secondary | ICD-10-CM

## 2021-05-24 DIAGNOSIS — M25551 Pain in right hip: Secondary | ICD-10-CM

## 2021-05-24 DIAGNOSIS — M25562 Pain in left knee: Secondary | ICD-10-CM

## 2021-05-24 DIAGNOSIS — J3081 Allergic rhinitis due to animal (cat) (dog) hair and dander: Secondary | ICD-10-CM | POA: Diagnosis not present

## 2021-05-24 DIAGNOSIS — G8929 Other chronic pain: Secondary | ICD-10-CM

## 2021-05-24 DIAGNOSIS — M25561 Pain in right knee: Secondary | ICD-10-CM

## 2021-05-24 DIAGNOSIS — J3089 Other allergic rhinitis: Secondary | ICD-10-CM | POA: Diagnosis not present

## 2021-05-24 DIAGNOSIS — J301 Allergic rhinitis due to pollen: Secondary | ICD-10-CM | POA: Diagnosis not present

## 2021-05-26 ENCOUNTER — Other Ambulatory Visit: Payer: Self-pay

## 2021-05-26 ENCOUNTER — Ambulatory Visit: Payer: Medicare HMO | Admitting: Neurology

## 2021-05-26 DIAGNOSIS — G43719 Chronic migraine without aura, intractable, without status migrainosus: Secondary | ICD-10-CM

## 2021-05-26 MED ORDER — ONABOTULINUMTOXINA 100 UNITS IJ SOLR
170.0000 [IU] | Freq: Once | INTRAMUSCULAR | Status: AC
  Administered 2021-05-26: 155 [IU] via INTRAMUSCULAR

## 2021-05-26 NOTE — Progress Notes (Signed)
[facility]um Clinic   Procedure Note Botox  Attending: Dr. Metta Clines  Preoperative Diagnosis(es): Chronic migraine  Consent obtained from: The patient Benefits discussed included, but were not limited to decreased muscle tightness, increased joint range of motion, and decreased pain.  Risk discussed included, but were not limited pain and discomfort, bleeding, bruising, excessive weakness, venous thrombosis, muscle atrophy and dysphagia.  Anticipated outcomes of the procedure as well as he risks and benefits of the alternatives to the procedure, and the roles and tasks of the personnel to be involved, were discussed with the patient, and the patient consents to the procedure and agrees to proceed. A copy of the patient medication guide was given to the patient which explains the blackbox warning.  Patients identity and treatment sites confirmed Yes.  .  Details of Procedure: Skin was cleaned with alcohol. Prior to injection, the needle plunger was aspirated to make sure the needle was not within a blood vessel.  There was no blood retrieved on aspiration.    Following is a summary of the muscles injected  And the amount of Botulinum toxin used:  Dilution 200 units of Botox was reconstituted with 4 ml of preservative free normal saline. Time of reconstitution: At the time of the office visit (<30 minutes prior to injection)   Injections  155 total units of Botox was injected with a 30 gauge needle.  Injection Sites: L occipitalis: 15 units- 3 sites  R occiptalis: 15 units- 3 sites  L upper trapezius: 15 units- 3 sites R upper trapezius: 15 units- 3 sits          L paraspinal: 10 units- 2 sites R paraspinal: 10 units- 2 sites  Face L frontalis(2 injection sites):10 units   R frontalis(2 injection sites):10 units         L corrugator: 5 units   R corrugator: 5 units           Procerus: 5 units   L temporalis: 20 units R temporalis: 20 units   Agent:  200 units of botulinum Type  A (Onobotulinum Toxin type A) was reconstituted with 4 ml of preservative free normal saline.  Time of reconstitution: At the time of the office visit (<30 minutes prior to injection)     Total injected (Units):  155  Total wasted (Units):  15  Patient tolerated procedure well without complications.

## 2021-05-28 ENCOUNTER — Encounter: Payer: Self-pay | Admitting: Orthopedic Surgery

## 2021-05-28 MED ORDER — METHYLPREDNISOLONE ACETATE 40 MG/ML IJ SUSP
40.0000 mg | INTRAMUSCULAR | Status: AC | PRN
  Administered 2021-05-24: 40 mg via INTRA_ARTICULAR

## 2021-05-28 MED ORDER — LIDOCAINE HCL 1 % IJ SOLN
5.0000 mL | INTRAMUSCULAR | Status: AC | PRN
  Administered 2021-05-24: 5 mL

## 2021-05-28 MED ORDER — BUPIVACAINE HCL 0.25 % IJ SOLN
4.0000 mL | INTRAMUSCULAR | Status: AC | PRN
  Administered 2021-05-24: 4 mL via INTRA_ARTICULAR

## 2021-05-28 NOTE — Progress Notes (Signed)
Office Visit Note   Patient: Virginia Luna           Date of Birth: 01-Jun-1961           MRN: [id number] Visit Date: 05/24/2021 Requested by: Shirline Frees, MD [place],   [id number] PCP: Shirline Frees, MD  Subjective: Chief Complaint  Patient presents with   Right Hip - Pain   Left Knee - Pain    HPI: Patient presents for evaluation of bilateral knee pain left worse than right as well as right hip pain.  She states she has constant pain in both knees worse on the left-hand side.  She has had prior injection with Dr. Junius Roads with good relief but that was over a year ago.  The result notes are reviewed.  Takes Norco 2 at bedtime.  Also takes oxycodone as needed.  She states "I have chronic fibromyalgia".  She is on disability.  Patient also reports right hip pain with popping with ambulation.  States that her right leg gives way at times.  Denies any radicular type symptoms with numbness and tingling going down the leg.              ROS: All systems reviewed are negative as they relate to the chief complaint within the history of present illness.  Patient denies  fevers or chills.   Assessment & Plan: Visit Diagnoses:  1. Chronic pain of both knees   2. Pain in right hip     Plan: Impression is bilateral knee arthritis radiographically left worse than right.  Hips do not look too bad on radiographs but she does have some pain with rotation.  Bilateral knee aspiration injection performed today.  Refer to Dr. Ernestina Patches for right hip injection so we can ascertain proportional pain generation from the hip versus knee.  Does not really look like the back at this time.  Follow-Up Instructions: No follow-ups on file.   Orders:  Orders Placed This Encounter  Procedures   XR Knee 1-2 Views Right   XR Knee 1-2 Views Left   XR HIP UNILAT W OR W/O PELVIS 2-3 VIEWS RIGHT   Ambulatory referral to Physical Medicine Rehab   No orders of the defined types  were placed in this encounter.     Procedures: Large Joint Inj: R knee on 05/24/2021 6:14 PM Indications: diagnostic evaluation, joint swelling and pain Details: 18 G 1.5 in needle, superolateral approach  Arthrogram: No  Medications: 5 mL lidocaine 1 %; 40 mg methylPREDNISolone acetate 40 MG/ML; 4 mL bupivacaine 0.25 % Outcome: tolerated well, no immediate complications Procedure, treatment alternatives, risks and benefits explained, specific risks discussed. Consent was given by the patient. Immediately prior to procedure a time out was called to verify the correct patient, procedure, equipment, support staff and site/side marked as required. Patient was prepped and draped in the usual sterile fashion.    Large Joint Inj: L knee on 05/24/2021 6:14 PM Indications: diagnostic evaluation, joint swelling and pain Details: 18 G 1.5 in needle, superolateral approach  Arthrogram: No  Medications: 5 mL lidocaine 1 %; 40 mg methylPREDNISolone acetate 40 MG/ML; 4 mL bupivacaine 0.25 % Outcome: tolerated well, no immediate complications Procedure, treatment alternatives, risks and benefits explained, specific risks discussed. Consent was given by the patient. Immediately prior to procedure a time out was called to verify the correct patient, procedure, equipment, support staff and site/side marked as required. Patient was prepped and draped in  the usual sterile fashion.      Clinical Data: No additional findings.  Objective: Vital Signs: LMP  (LMP Unknown)   Physical Exam:   Constitutional: Patient appears well-developed HEENT:  Head: Normocephalic Eyes:EOM are normal Neck: Normal range of motion Cardiovascular: Normal rate Pulmonary/chest: Effort normal Neurologic: Patient is alert Skin: Skin is warm Psychiatric: Patient has normal mood and affect   Ortho Exam: Ortho exam demonstrates full active and passive range of motion of the ankles.  Mild groin pain on the right with  internal ex rotation of the leg patient has 5 out of 5 ankle dorsiflexion plantarflexion quad hamstring strength with palpable pedal pulses.  Mild effusion present in both knees.  Medial greater than lateral joint line tenderness is present but extensor mechanism is intact.  Collateral and cruciate ligaments are stable.  No masses lymphadenopathy or skin changes noted in the bilateral knee region.  Specialty Comments:  No specialty comments available.  Imaging: No results found.   PMFS History: Patient Active Problem List   Diagnosis Date Noted   Body mass index (BMI) 50.0-59.9, adult ([place]) 03/07/2021   Allergic rhinitis due to animal (cat) (dog) hair and dander 10/06/2020   Allergic rhinitis due to pollen 10/06/2020   Moderate persistent asthma, uncomplicated 63/87/5643   Brain mass 10/06/2020   Paresthesia 05/27/2020   Diabetes mellitus ([place]) 05/25/2020   Hypertension associated with diabetes ([place]) 05/25/2020   Hyperlipidemia associated with type 2 diabetes mellitus ([place]) 05/25/2020   Vitamin D deficiency 05/25/2020   NAFLD (nonalcoholic fatty liver disease) 05/25/2020   Sciatica of left side 05/25/2020   Past Medical History:  Diagnosis Date   Allergies    Anemia    Arthritis    Asthma    Back pain    Chronic pain    Complication of anesthesia    woke up during colonoscopy   Diabetes ([place])    Diabetes mellitus without complication (HCC)    Edema, lower extremity    Fibroid    Fibromyalgia    Chronic   GERD (gastroesophageal reflux disease)    Headache    migraines   High blood pressure    History of hiatal hernia    History of stomach ulcers    IBS (irritable bowel syndrome)    Joint pain    Sleep apnea    did use cpap-lost 25lb-says she does not need it   NO CPAP   Wears contact lenses     Family History  Problem Relation Age of Onset   Breast cancer Paternal 9    Breast cancer Paternal Grandmother    Obesity Mother    Diabetes Father    High blood  pressure Father    Sudden death Father     Past Surgical History:  Procedure Laterality Date   BREAST BIOPSY     BREAST EXCISIONAL BIOPSY     BREAST LUMPECTOMY WITH RADIOACTIVE SEED LOCALIZATION Bilateral 11/24/2014   Procedure: BILATERAL BREAST LUMPECTOMY WITH RADIOACTIVE SEED LOCALIZATION;  Surgeon: [name], MD;  Location: [place];  Service: General;  Laterality: Bilateral;   CHOLECYSTECTOMY     COLONOSCOPY     DILATION AND CURETTAGE OF UTERUS     ESOPHAGOGASTRODUODENOSCOPY (EGD) WITH PROPOFOL N/A 07/23/2016   Procedure: ESOPHAGOGASTRODUODENOSCOPY (EGD) WITH PROPOFOL;  Surgeon: Laurence Spates, MD;  Location: [facility];  Service: Endoscopy;  Laterality: N/A;   ESOPHAGOGASTRODUODENOSCOPY (EGD) WITH PROPOFOL N/A 06/11/2018   Procedure: ESOPHAGOGASTRODUODENOSCOPY (EGD) WITH PROPOFOL;  Surgeon: Laurence Spates, MD;  Location: [facility];  Service: Endoscopy;  Laterality: N/A;   LUMBAR LAMINECTOMY  2010   ORIF WRIST FRACTURE Right 11/24/2020   Procedure: OPEN REDUCTION INTERNAL FIXATION RIGHT WRIST FRACTURE;  Surgeon: Renette Butters, MD;  Location: [facility];  Service: Orthopedics;  Laterality: Right;   UMBILICAL HERNIA REPAIR     age 15   UPPER GI ENDOSCOPY     Social History   Occupational History   Occupation: disabled    Comment: Psychologist, occupational at daycare  Tobacco Use   Smoking status: Never   Smokeless tobacco: Never  Vaping Use   Vaping Use: Never used  Substance and Sexual Activity   Alcohol use: No   Drug use: No   Sexual activity: Not Currently

## 2021-05-31 ENCOUNTER — Encounter (INDEPENDENT_AMBULATORY_CARE_PROVIDER_SITE_OTHER): Payer: Self-pay | Admitting: Family Medicine

## 2021-05-31 ENCOUNTER — Other Ambulatory Visit: Payer: Self-pay

## 2021-05-31 ENCOUNTER — Telehealth (INDEPENDENT_AMBULATORY_CARE_PROVIDER_SITE_OTHER): Payer: Medicare HMO | Admitting: Family Medicine

## 2021-05-31 DIAGNOSIS — Z6841 Body Mass Index (BMI) 40.0 and over, adult: Secondary | ICD-10-CM

## 2021-05-31 DIAGNOSIS — J3089 Other allergic rhinitis: Secondary | ICD-10-CM | POA: Diagnosis not present

## 2021-05-31 DIAGNOSIS — B9689 Other specified bacterial agents as the cause of diseases classified elsewhere: Secondary | ICD-10-CM | POA: Diagnosis not present

## 2021-05-31 DIAGNOSIS — J301 Allergic rhinitis due to pollen: Secondary | ICD-10-CM | POA: Diagnosis not present

## 2021-05-31 DIAGNOSIS — J3081 Allergic rhinitis due to animal (cat) (dog) hair and dander: Secondary | ICD-10-CM | POA: Diagnosis not present

## 2021-05-31 DIAGNOSIS — E559 Vitamin D deficiency, unspecified: Secondary | ICD-10-CM | POA: Diagnosis not present

## 2021-05-31 DIAGNOSIS — J329 Chronic sinusitis, unspecified: Secondary | ICD-10-CM | POA: Diagnosis not present

## 2021-06-01 ENCOUNTER — Other Ambulatory Visit: Payer: Self-pay | Admitting: Family Medicine

## 2021-06-01 DIAGNOSIS — Z1231 Encounter for screening mammogram for malignant neoplasm of breast: Secondary | ICD-10-CM

## 2021-06-01 MED ORDER — VITAMIN D (ERGOCALCIFEROL) 1.25 MG (50000 UNIT) PO CAPS: ORAL_CAPSULE | ORAL | 0 refills | Status: DC

## 2021-06-01 MED ORDER — DOXYCYCLINE HYCLATE 100 MG PO TABS: 100.0000 mg | ORAL_TABLET | Freq: Two times a day (BID) | ORAL | 0 refills | Status: AC

## 2021-06-01 NOTE — Progress Notes (Signed)
TeleHealth Visit:  Due to the COVID-19 pandemic, this visit was completed with telemedicine (audio/video) technology to reduce patient and provider exposure as well as to preserve personal protective equipment.   Alessandra has verbally consented to this TeleHealth visit. The patient is located at home, the provider is located at the [facility] office. The participants in this visit include the listed provider and patient. The visit was conducted today via MyChart video.  Chief Complaint: OBESITY Ellarae is here to discuss her progress with her obesity treatment plan along with follow-up of her obesity related diagnoses. Ernestine is on practicing portion control and making smarter food choices, such as increasing vegetables and decreasing simple carbohydrates and states she is following her eating plan approximately 60-65% of the time. Kazuko states she is walking for 30 minutes 4 times per week.  Today's visit was #: 28 Starting weight: 288 lbs Starting date: 08/27/2019  Interim History: Chanee has been taking Benadryl, has increased her water intake, and is using Flonase for sinusitis.  She will start doxycycline.  She recently had Botox injections and says that it still hurts.  She saw Dr. Marlou Sa and had xrays of bilateral knees and hips.  She says she has not gotten a pain doctor yet.  She has been taking gabapentin since her near fall in her bathroom.  Assessment/Plan:   1. Bacterial sinusitis She has been taking Benadryl and has been using Flonase.  She has increased her water intake.    Plan:  Start doxycycline 100 mg twice daily, as per below.  - Start doxycycline (VIBRA-TABS) 100 MG tablet; Take 1 tablet (100 mg total) by mouth 2 (two) times daily for 10 days.  Dispense: 20 tablet; Refill: 0  2. Vitamin D deficiency Improving, but not optimized.  She is taking vitamin D 50,000 IU weekly.  Plan: Continue to take prescription Vitamin D @50 ,000 IU every week as  prescribed.  Follow-up for routine testing of Vitamin D, at least 2-3 times per year to avoid over-replacement.  Lab Results  Component Value Date   VD25OH 42.6 02/14/2021   VD25OH 38.1 10/12/2020   VD25OH 31.1 02/08/2020   - Refill Vitamin D, Ergocalciferol, (DRISDOL) 1.25 MG (50000 UNIT) CAPS capsule; TAKE ONE CAPSULE BY MOUTH EVERY 7 DAYS  Dispense: 4 capsule; Refill: 0  3. Obesity, current BMI 50.9  Demmi is currently in the action stage of change. As such, her goal is to continue with weight loss efforts. She has agreed to practicing portion control and making smarter food choices, such as increasing vegetables and decreasing simple carbohydrates.   Exercise goals:  As is.  Behavioral modification strategies: increasing lean protein intake, decreasing simple carbohydrates, increasing vegetables, increasing water intake, and decreasing liquid calories.  Janely has agreed to follow-up with our clinic in 2-3 weeks. She was informed of the importance of frequent follow-up visits to maximize her success with intensive lifestyle modifications for her multiple health conditions.  Objective:   VITALS: Per patient if applicable, see vitals. GENERAL: Alert and in no acute distress. CARDIOPULMONARY: No increased WOB. Speaking in clear sentences.  PSYCH: Pleasant and cooperative. Speech normal rate and rhythm. Affect is appropriate. Insight and judgement are appropriate. Attention is focused, linear, and appropriate.  NEURO: Oriented as arrived to appointment on time with no prompting.   Lab Results  Component Value Date   CREATININE 0.65 02/14/2021   BUN 12 02/14/2021   NA 144 02/14/2021   K 3.8 02/14/2021  CL 101 02/14/2021   CO2 24 02/14/2021   Lab Results  Component Value Date   ALT 10 02/14/2021   AST 4 02/14/2021   GGT 75 (H) 02/08/2020   ALKPHOS 136 (H) 02/14/2021   BILITOT 0.5 02/14/2021   Lab Results  Component Value Date   HGBA1C 6.2 (H) 02/14/2021   HGBA1C 6.0  (H) 10/12/2020   HGBA1C 6.4 (H) 02/08/2020   Lab Results  Component Value Date   INSULIN 12.4 02/14/2021   INSULIN 10.9 10/12/2020   INSULIN 18.5 02/08/2020   INSULIN 15.9 08/27/2019   Lab Results  Component Value Date   TSH 0.458 02/14/2021   Lab Results  Component Value Date   CHOL 139 02/14/2021   HDL 70 02/14/2021   LDLCALC 56 02/14/2021   TRIG 66 02/14/2021   CHOLHDL 2.0 02/14/2021   Lab Results  Component Value Date   VD25OH 42.6 02/14/2021   VD25OH 38.1 10/12/2020   VD25OH 31.1 02/08/2020   Lab Results  Component Value Date   WBC 8.9 02/14/2021   HGB 11.6 02/14/2021   HCT 36.2 02/14/2021   MCV 84 02/14/2021   PLT 463 (H) 02/14/2021   Lab Results  Component Value Date   IRON 34 02/14/2021   TIBC 329 02/14/2021   FERRITIN 50 02/14/2021   Attestation Statements:   Reviewed by clinician on day of visit: allergies, medications, problem list, medical history, surgical history, family history, social history, and previous encounter notes.  I, Water quality scientist, CMA, am acting as transcriptionist for Briscoe Deutscher, DO  I have reviewed the above documentation for accuracy and completeness, and I agree with the above. - Briscoe Deutscher, DO, MS, FAAFP, DABOM - Family and Bariatric Medicine.

## 2021-06-07 ENCOUNTER — Telehealth: Payer: Self-pay | Admitting: Physical Medicine and Rehabilitation

## 2021-06-07 DIAGNOSIS — J3081 Allergic rhinitis due to animal (cat) (dog) hair and dander: Secondary | ICD-10-CM | POA: Diagnosis not present

## 2021-06-07 DIAGNOSIS — J3089 Other allergic rhinitis: Secondary | ICD-10-CM | POA: Diagnosis not present

## 2021-06-07 DIAGNOSIS — J301 Allergic rhinitis due to pollen: Secondary | ICD-10-CM | POA: Diagnosis not present

## 2021-06-07 NOTE — Telephone Encounter (Signed)
Called patient to advise that she does not need a driver for a hip injection.

## 2021-06-07 NOTE — Telephone Encounter (Signed)
Pt called stating she's getting an inj tomorrow and wants to make sure she can drive herself.   [phone]

## 2021-06-08 ENCOUNTER — Ambulatory Visit: Payer: Self-pay

## 2021-06-08 ENCOUNTER — Other Ambulatory Visit: Payer: Self-pay

## 2021-06-08 ENCOUNTER — Encounter: Payer: Self-pay | Admitting: Physical Medicine and Rehabilitation

## 2021-06-08 ENCOUNTER — Ambulatory Visit (INDEPENDENT_AMBULATORY_CARE_PROVIDER_SITE_OTHER): Payer: Medicare HMO | Admitting: Physical Medicine and Rehabilitation

## 2021-06-08 DIAGNOSIS — M25551 Pain in right hip: Secondary | ICD-10-CM | POA: Diagnosis not present

## 2021-06-08 NOTE — Progress Notes (Signed)
   Virginia Luna - 60 y.o. female MRN [id number]  Date of birth: [birth year]  Office Visit Note: Visit Date: 06/08/2021 PCP: Shirline Frees, MD Referred by: Shirline Frees, MD  Subjective: Chief Complaint  Patient presents with   Right Hip - Pain   HPI:  Virginia Luna is a 60 y.o. female who comes in today at the request of Dr. Anderson Malta for planned Right anesthetic hip arthrogram with fluoroscopic guidance.  The patient has failed conservative care including home exercise, medications, time and activity modification.  This injection will be diagnostic and hopefully therapeutic.  Please see requesting physician notes for further details and justification.   Knees better after Dr. Marlou Sa injection   ROS Otherwise per HPI.  Assessment & Plan: Visit Diagnoses:    ICD-10-CM   1. Pain in right hip  M25.551 XR C-ARM NO REPORT      Plan: No additional findings.   Meds & Orders: No orders of the defined types were placed in this encounter.   Orders Placed This Encounter  Procedures   Large Joint Inj   XR C-ARM NO REPORT    Follow-up: No follow-ups on file.   Procedures: Large Joint Inj: R hip joint on 06/08/2021 8:15 AM Indications: diagnostic evaluation and pain Details: 22 G 3.5 in needle, fluoroscopy-guided anterior approach  Arthrogram: No  Medications: 4 mL bupivacaine 0.25 %; 60 mg triamcinolone acetonide 40 MG/ML Outcome: tolerated well, no immediate complications  There was excellent flow of contrast producing a partial arthrogram of the hip. The patient did have relief of symptoms during the anesthetic phase of the injection. Procedure, treatment alternatives, risks and benefits explained, specific risks discussed. Consent was given by the patient. Immediately prior to procedure a time out was called to verify the correct patient, procedure, equipment, support staff and site/side marked as required. Patient was prepped and draped in the usual sterile fashion.          Clinical History: No specialty comments available.     Objective:  VS:  HT:    WT:   BMI:     BP:   HR: bpm  TEMP: ( )  RESP:  Physical Exam   Imaging: No results found.

## 2021-06-08 NOTE — Progress Notes (Signed)
Pt state right hip pain. Pt state walking, standing and laying down makes the pain worse. Pt state she takes pain meds and uses heating to help ease her pain.  Numeric Pain Rating Scale and Functional Assessment Average Pain 8   In the last MONTH (on 0-10 scale) has pain interfered with the following?  1. General activity like being  able to carry out your everyday physical activities such as walking, climbing stairs, carrying groceries, or moving a chair?  Rating(10)   -BT, -Dye Allergies.

## 2021-06-12 DIAGNOSIS — I10 Essential (primary) hypertension: Secondary | ICD-10-CM | POA: Diagnosis not present

## 2021-06-12 DIAGNOSIS — J4520 Mild intermittent asthma, uncomplicated: Secondary | ICD-10-CM | POA: Diagnosis not present

## 2021-06-12 DIAGNOSIS — K219 Gastro-esophageal reflux disease without esophagitis: Secondary | ICD-10-CM | POA: Diagnosis not present

## 2021-06-12 DIAGNOSIS — D649 Anemia, unspecified: Secondary | ICD-10-CM | POA: Diagnosis not present

## 2021-06-12 DIAGNOSIS — E7800 Pure hypercholesterolemia, unspecified: Secondary | ICD-10-CM | POA: Diagnosis not present

## 2021-06-12 DIAGNOSIS — E119 Type 2 diabetes mellitus without complications: Secondary | ICD-10-CM | POA: Diagnosis not present

## 2021-06-14 ENCOUNTER — Ambulatory Visit (INDEPENDENT_AMBULATORY_CARE_PROVIDER_SITE_OTHER): Payer: Medicare HMO | Admitting: Family Medicine

## 2021-06-14 ENCOUNTER — Encounter (INDEPENDENT_AMBULATORY_CARE_PROVIDER_SITE_OTHER): Payer: Self-pay | Admitting: Family Medicine

## 2021-06-14 ENCOUNTER — Other Ambulatory Visit: Payer: Self-pay

## 2021-06-14 VITALS — BP 127/80 | HR 101 | Temp 98.1°F | Ht 61.0 in | Wt 264.0 lb

## 2021-06-14 DIAGNOSIS — Z6841 Body Mass Index (BMI) 40.0 and over, adult: Secondary | ICD-10-CM

## 2021-06-14 DIAGNOSIS — E1169 Type 2 diabetes mellitus with other specified complication: Secondary | ICD-10-CM | POA: Diagnosis not present

## 2021-06-14 DIAGNOSIS — E538 Deficiency of other specified B group vitamins: Secondary | ICD-10-CM

## 2021-06-14 DIAGNOSIS — G43809 Other migraine, not intractable, without status migrainosus: Secondary | ICD-10-CM

## 2021-06-14 DIAGNOSIS — Z79899 Other long term (current) drug therapy: Secondary | ICD-10-CM | POA: Diagnosis not present

## 2021-06-14 DIAGNOSIS — J301 Allergic rhinitis due to pollen: Secondary | ICD-10-CM | POA: Diagnosis not present

## 2021-06-14 DIAGNOSIS — E559 Vitamin D deficiency, unspecified: Secondary | ICD-10-CM | POA: Diagnosis not present

## 2021-06-14 DIAGNOSIS — G8929 Other chronic pain: Secondary | ICD-10-CM

## 2021-06-14 DIAGNOSIS — R110 Nausea: Secondary | ICD-10-CM | POA: Diagnosis not present

## 2021-06-14 DIAGNOSIS — K5909 Other constipation: Secondary | ICD-10-CM | POA: Diagnosis not present

## 2021-06-14 DIAGNOSIS — F5105 Insomnia due to other mental disorder: Secondary | ICD-10-CM | POA: Diagnosis not present

## 2021-06-14 DIAGNOSIS — J3089 Other allergic rhinitis: Secondary | ICD-10-CM | POA: Diagnosis not present

## 2021-06-14 MED ORDER — MECLIZINE HCL 25 MG PO TABS: ORAL_TABLET | ORAL | 0 refills | Status: DC

## 2021-06-14 MED ORDER — MONTELUKAST SODIUM 10 MG PO TABS: 10.0000 mg | ORAL_TABLET | Freq: Every day | ORAL | 3 refills | Status: DC

## 2021-06-14 MED ORDER — LEVOCETIRIZINE DIHYDROCHLORIDE 5 MG PO TABS: 5.0000 mg | ORAL_TABLET | Freq: Every evening | ORAL | 0 refills | Status: DC

## 2021-06-14 MED ORDER — OZEMPIC (0.25 OR 0.5 MG/DOSE) 2 MG/1.5ML ~~LOC~~ SOPN: 0.5000 mg | PEN_INJECTOR | SUBCUTANEOUS | 0 refills | Status: DC

## 2021-06-14 MED ORDER — GABAPENTIN 300 MG PO CAPS: 300.0000 mg | ORAL_CAPSULE | Freq: Two times a day (BID) | ORAL | 0 refills | Status: DC

## 2021-06-14 MED ORDER — VITAMIN D (ERGOCALCIFEROL) 1.25 MG (50000 UNIT) PO CAPS: ORAL_CAPSULE | ORAL | 0 refills | Status: DC

## 2021-06-14 MED ORDER — METHOCARBAMOL 500 MG PO TABS: ORAL_TABLET | ORAL | 0 refills | Status: DC

## 2021-06-14 MED ORDER — LINACLOTIDE 72 MCG PO CAPS: 72.0000 ug | ORAL_CAPSULE | Freq: Every day | ORAL | 0 refills | Status: DC | PRN

## 2021-06-14 MED ORDER — ONDANSETRON 4 MG PO TBDP: 4.0000 mg | ORAL_TABLET | Freq: Four times a day (QID) | ORAL | 0 refills | Status: DC | PRN

## 2021-06-14 NOTE — Telephone Encounter (Signed)
Dr.Wallace °

## 2021-06-15 NOTE — Progress Notes (Signed)
Chief Complaint:   OBESITY Virginia Luna is here to discuss her progress with her obesity treatment plan along with follow-up of her obesity related diagnoses. See Medical Weight Management Flowsheet for complete bioelectrical impedance results.  Today's visit was #: 34 Starting weight: 288 lbs Starting date: 08/27/2019 Weight change since last visit: 5 lbs Total lbs lost to date: 24 lbs Total weight loss percentage to date: -8.33%  Nutrition Plan: Practicing portion control and making smarter food choices, such as increasing vegetables and decreasing simple carbohydrates for 65% of the time. Activity: Increased walking. Anti-obesity medications: Ozempic 0.5 mg subcutaneously weekly. Reported side effects: None.  Interim History: Virginia Luna says she is down a pant size since her last visit!  She reports continued left-sided headache, pounding. She is status post injection in bilateral knees, and in right hip bursa (still very painful).   She had another near fall.   She says that her constipation improved with an enema and Linzess PRN.  Assessment/Plan:   1. Other chronic pain Recently had injections in bilateral knees and right hip bursa.  She has a left-sided, pounding headache today.  Taking gabapentin 300 mg twice daily.  Plan:  Refill gabapentin 300 mg twice daily, as per below.  - Refill gabapentin (NEURONTIN) 300 MG capsule; Take 1 capsule (300 mg total) by mouth 2 (two) times daily with breakfast and lunch.  Dispense: 60 capsule; Refill: 0  2. Seasonal allergic rhinitis due to pollen Will refill Xyzal and Singulair today, as per below.  - Refill levocetirizine (XYZAL) 5 MG tablet; Take 1 tablet (5 mg total) by mouth every evening.  Dispense: 90 tablet; Refill: 0 - Refill montelukast (SINGULAIR) 10 MG tablet; Take 1 tablet (10 mg total) by mouth at bedtime.  Dispense: 30 tablet; Refill: 3  3. Insomnia due to mental condition Virginia Luna says she is still not sleeping.  She is  prescribed Seroquel 300 mg and Ambien 10 mg at bedtime by Dr. Clovis Pu.      4. Vitamin D deficiency Not at goal. She is taking vitamin D 50,000 IU weekly.  Plan: Continue to take prescription Vitamin D @50 ,000 IU every week as prescribed.  Follow-up for routine testing of Vitamin D, at least 2-3 times per year to avoid over-replacement.  Lab Results  Component Value Date   VD25OH 42.6 02/14/2021   VD25OH 38.1 10/12/2020   VD25OH 31.1 02/08/2020   - Refill Vitamin D, Ergocalciferol, (DRISDOL) 1.25 MG (50000 UNIT) CAPS capsule; TAKE ONE CAPSULE BY MOUTH EVERY 7 DAYS  Dispense: 4 capsule; Refill: 0  5. Chronic constipation Improved with enema and Linzess 72 mcg as needed.  Plan:  Refill Linzess 72 mcg today.  - Refill linaclotide (LINZESS) 72 MCG capsule; Take 1 capsule (72 mcg total) by mouth daily as needed (constipation).  Dispense: 90 capsule; Refill: 0  6. Nausea Chronic, being followed by PCP and Neurology.  Will refill meclizine and Zofran today, as per below.  - Refill meclizine (ANTIVERT) 25 MG tablet; TAKE ONE TABLET BY MOUTH EVERY 8 HOURS AS NEEDED FOR DIZZINESS  Dispense: 30 tablet; Refill: 0 - Refill ondansetron (ZOFRAN ODT) 4 MG disintegrating tablet; Take 1 tablet (4 mg total) by mouth every 6 (six) hours as needed for nausea or vomiting.  Dispense: 20 tablet; Refill: 0  7. Type 2 diabetes mellitus with other specified complication, without long-term current use of insulin (HCC) Diabetes Mellitus: Not at goal. Medication: Ozempic 0.5 mg subcutaneously weekly. Issues reviewed: blood sugar goals, complications  of diabetes mellitus, hypoglycemia prevention and treatment, exercise, and nutrition.  Plan:  Will refill Ozempic 0.5 mg subcutaneously weekly today. The patient will continue to focus on protein-rich, low simple carbohydrate foods. We reviewed the importance of hydration, regular exercise for stress reduction, and restorative sleep.   Lab Results  Component Value  Date   HGBA1C 6.2 (H) 02/14/2021   HGBA1C 6.0 (H) 10/12/2020   HGBA1C 6.4 (H) 02/08/2020   Lab Results  Component Value Date   LDLCALC 56 02/14/2021   CREATININE 0.65 02/14/2021   - Refill Semaglutide,0.25 or 0.5MG /DOS, (OZEMPIC, 0.25 OR 0.5 MG/DOSE,) 2 MG/1.5ML SOPN; Inject 0.5 mg into the skin once a week.  Dispense: 1.5 mL; Refill: 0  8. Other migraine without status migrainosus, not intractable She is getting Botox injections for migraines.  She also has Maxalt for abortive care.  9. Medication management Will refill Robaxin 500 mg every 8 hours as needed for muscle spasms, as per below.  - Refill methocarbamol (ROBAXIN) 500 MG tablet; TAKE ONE TABLET BY MOUTH every EIGHT hours AS NEEDED FOR MUSCLE SPASMS  Dispense: 20 tablet; Refill: 0  10. B12 deficiency Lab Results  Component Value Date   VITAMINB12 508 02/14/2021   Supplementation: None.   11. Obesity, current BMI 49.9  Course: Virginia Luna is currently in the action stage of change. As such, her goal is to continue with weight loss efforts.   Nutrition goals: She has agreed to practicing portion control and making smarter food choices, such as increasing vegetables and decreasing simple carbohydrates.   Exercise goals:  Increase NEAT.  Behavioral modification strategies: increasing lean protein intake, decreasing simple carbohydrates, increasing vegetables, and increasing water intake.  Virginia Luna has agreed to follow-up with our clinic in 4 weeks. She was informed of the importance of frequent follow-up visits to maximize her success with intensive lifestyle modifications for her multiple health conditions.   Objective:   Blood pressure 127/80, pulse (!) 101, temperature 98.1 F (36.7 C), temperature source Oral, height 5\' 1"  (1.549 m), weight 264 lb (119.7 kg), SpO2 98 %. Body mass index is 49.88 kg/m.  General: Cooperative, alert, well developed, in no acute distress. HEENT: Conjunctivae and lids  unremarkable. Cardiovascular: Regular rhythm.  Lungs: Normal work of breathing. Neurologic: No focal deficits.   Lab Results  Component Value Date   CREATININE 0.65 02/14/2021   BUN 12 02/14/2021   NA 144 02/14/2021   K 3.8 02/14/2021   CL 101 02/14/2021   CO2 24 02/14/2021   Lab Results  Component Value Date   ALT 10 02/14/2021   AST 4 02/14/2021   GGT 75 (H) 02/08/2020   ALKPHOS 136 (H) 02/14/2021   BILITOT 0.5 02/14/2021   Lab Results  Component Value Date   HGBA1C 6.2 (H) 02/14/2021   HGBA1C 6.0 (H) 10/12/2020   HGBA1C 6.4 (H) 02/08/2020   Lab Results  Component Value Date   INSULIN 12.4 02/14/2021   INSULIN 10.9 10/12/2020   INSULIN 18.5 02/08/2020   INSULIN 15.9 08/27/2019   Lab Results  Component Value Date   TSH 0.458 02/14/2021   Lab Results  Component Value Date   CHOL 139 02/14/2021   HDL 70 02/14/2021   LDLCALC 56 02/14/2021   TRIG 66 02/14/2021   CHOLHDL 2.0 02/14/2021   Lab Results  Component Value Date   VD25OH 42.6 02/14/2021   VD25OH 38.1 10/12/2020   VD25OH 31.1 02/08/2020   Lab Results  Component Value Date   WBC 8.9 02/14/2021  HGB 11.6 02/14/2021   HCT 36.2 02/14/2021   MCV 84 02/14/2021   PLT 463 (H) 02/14/2021   Lab Results  Component Value Date   IRON 34 02/14/2021   TIBC 329 02/14/2021   FERRITIN 50 02/14/2021   Attestation Statements:   Reviewed by clinician on day of visit: allergies, medications, problem list, medical history, surgical history, family history, social history, and previous encounter notes.  Time spent on visit including pre-visit chart review and post-visit care and documentation was 52 minutes.  I, Water quality scientist, CMA, am acting as transcriptionist for Briscoe Deutscher, DO  I have reviewed the above documentation for accuracy and completeness, and I agree with the above. -  Briscoe Deutscher, DO, MS, FAAFP, DABOM - Family and Bariatric Medicine.

## 2021-06-20 ENCOUNTER — Ambulatory Visit (INDEPENDENT_AMBULATORY_CARE_PROVIDER_SITE_OTHER): Payer: Medicare HMO | Admitting: Neurology

## 2021-06-20 ENCOUNTER — Encounter: Payer: Self-pay | Admitting: Neurology

## 2021-06-20 ENCOUNTER — Other Ambulatory Visit: Payer: Self-pay

## 2021-06-20 VITALS — BP 142/86 | HR 108 | Ht 61.0 in | Wt 267.4 lb

## 2021-06-20 DIAGNOSIS — G43709 Chronic migraine without aura, not intractable, without status migrainosus: Secondary | ICD-10-CM

## 2021-06-20 DIAGNOSIS — D496 Neoplasm of unspecified behavior of brain: Secondary | ICD-10-CM | POA: Diagnosis not present

## 2021-06-20 MED ORDER — KETOROLAC TROMETHAMINE 60 MG/2ML IM SOLN
60.0000 mg | Freq: Once | INTRAMUSCULAR | Status: AC
  Administered 2021-06-20: 60 mg via INTRAMUSCULAR

## 2021-06-20 NOTE — Addendum Note (Signed)
Addended by: Venetia Night on: 06/20/2021 11:35 AM   Modules accepted: Orders

## 2021-06-20 NOTE — Progress Notes (Signed)
NEUROLOGY FOLLOW UP OFFICE NOTE  Virginia Luna [id number]  Assessment/Plan:   1.Vestibular migraine/chronic migraine without aura, without status migrainosus, not intractable 2. She has some persistent vertigo outside of the headache - may still be migrainous but cannot rule out cerebellar hemangiom 2.Possible cerebellar hemangioma   Will give her Toradol 60mg  injection today. Migraine prevention:  Botox - she has had improvement with reduce intensity, duration and frequency. Migraine rescue:  Maxalt 10mg  with Zofran Limit use of pain relievers to no more than 2 days out of week to prevent risk of rebound or medication-overuse headache. Keep headache diary Follow up 6 months    Subjective:  Virginia Luna is a 60 year old right-handed female with diabetes, chronic back pain, and IBS who follows up for migraine and brain mass.     UPDATE: Status post 2 rounds of Botox.  Last visit, she was averaging 25 total headache days a month, severe headaches lasting 4 days a week.  Now: Intensity:  moderate to severe Duration:  few hours with rizatriptan (may not repeat dose) Frequency:  15 headache days a month (10 severe).  Dizzy spells 3 times a month She is with ongoing headache today   Repeat MRI of brain with and without contrast on 02/21/2021 was stable compared to March.  Neurosurgery is still monitoring  Current NSAIDS/analgesics:  Hydrocodone-acetaminophen (pain), diclofenac 75mg  Current triptans:  Maxalt 10mg  Current ergotamine:  none Current anti-emetic:  Zofran ODT 4mg  Current muscle relaxants:  Cyclobenzaprine 10mg  BID PRN Current Antihypertensive medications:  Metoprolol succinate, amlodipine Current Antidepressant medications:  none Current Anticonvulsant medications:  gabapenin 300mg  QHS Current anti-CGRP:  none Current Vitamins/Herbal/Supplements:  D, melatonin Current Antihistamines/Decongestants:  Meclizine 25mg  PRN, Benadryl 50mg  QHS, Flonase Other therapy:   ice Hormone/birth control:  none Other medications:  Ambien   Caffeine:  No coffee.  Occasional Coke Diet:  1 gallon water daily.  Tries not to skip meals.   Exercise:  Unable due to pain, nausea and dizziness Depression:  no; Anxiety:  no Other pain:  fibromyalgia Sleep hygiene:  varies   HISTORY:  She has had migraines for many years.  They were manageable, usually occurring once a month.  They started to become more frequent in 2020, progressing to the point that she now has a persistent daily headache.  They are typically bifrontal and pounding, associated with nausea, vomiting, vertigo, photophobia, phonophobia and blurred vision but no numbness or weakness.  Intensity will fluctuate from dull-moderate to severe about once a week for 2-3 days.  Sunlight, loud noise and quick movements are aggravating factors.  Resting in a dark and cool quiet room helps relieve pain.  She cannot really identify a specific trigger for her chronic daily headache but it may have followed a fall in which she tripped and hit her head.   She treats headache with sumatriptan (2-3 days a week) and treats nausea with Zofran and dizziness with meclizine.  She also takes hydrocodone for fibromyalgia.    MRI of brain with and without contrast on 09/03/2020 showed enhancing 8 x 6 mm posterior fossa mass left of midline with edema within the superior cerebellum.  Follow up MRI on 09/24/2020 was stable.  She was referred to neurosurgery who favored to closely monitor.  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      Past NSAIDS/analgesics:  Ibuprofen, naproxen, Excedrin/BC/Goody Past abortive triptans:  sumatriptan  Past abortive ergotamine:  none Past muscle relaxants:  none Past anti-emetic:  Promethazine $RemoveBefor'25mg'[id number]$  Past antihypertensive medications:  none Past antidepressant medications:  Amitriptyline or nortriptyline, venlafaxine Past anticonvulsant  medications:  topiramate Past anti-CGRP:  Emgality Past vitamins/Herbal/Supplements:  none Past antihistamines/decongestants:  none Other past therapies:  vestibular rehab     Family history of headache:  unknown  PAST MEDICAL HISTORY: Past Medical History:  Diagnosis Date   Allergies    Anemia    Arthritis    Asthma    Back pain    Chronic pain    Complication of anesthesia    woke up during colonoscopy   Diabetes ([place])    Diabetes mellitus without complication (HCC)    Edema, lower extremity    Fibroid    Fibromyalgia    Chronic   GERD (gastroesophageal reflux disease)    Headache    migraines   High blood pressure    History of hiatal hernia    History of stomach ulcers    IBS (irritable bowel syndrome)    Joint pain    Sleep apnea    did use cpap-lost 25lb-says she does not need it   NO CPAP   Wears contact lenses     MEDICATIONS: Current Outpatient Medications on File Prior to Visit  Medication Sig Dispense Refill   albuterol (PROVENTIL HFA;VENTOLIN HFA) 108 (90 BASE) MCG/ACT inhaler Inhale 2 puffs into the lungs every 6 (six) hours as needed for wheezing or shortness of breath.      amLODipine (NORVASC) 5 MG tablet Take 5 mg by mouth daily.     atorvastatin (LIPITOR) 10 MG tablet Take 10 mg by mouth at bedtime.     azelastine (ASTELIN) 0.1 % nasal spray Place 1 spray into both nostrils 2 (two) times daily as needed for allergies.  2   blood glucose meter kit and supplies Dispense based on patient and insurance preference. Use up to four times daily as directed. (FOR ICD-10 E10.9, E11.9). 1 each 0   cyclobenzaprine (FLEXERIL) 10 MG tablet      diclofenac (VOLTAREN) 75 MG EC tablet      EPINEPHrine 0.3 mg/0.3 mL IJ SOAJ injection Inject 0.3 mg into the muscle as needed for anaphylaxis.     fluticasone (FLONASE) 50 MCG/ACT nasal spray Place 1 spray into both nostrils daily as needed for allergies.     fluticasone furoate-vilanterol (BREO ELLIPTA) 200-25 MCG/INH  AEPB Inhale 1 puff daily into the lungs.     gabapentin (NEURONTIN) 300 MG capsule Take 1 capsule (300 mg total) by mouth 2 (two) times daily with breakfast and lunch. 60 capsule 0   HYDROcodone-acetaminophen (NORCO/VICODIN) 5-325 MG tablet Take 1 tablet by mouth 2 (two) times daily as needed for moderate pain.   0   Insulin Pen Needle (BD PEN NEEDLE NANO 2ND GEN) 32G X 4 MM MISC 1 Package by Does not apply route daily. 100 each 0   levocetirizine (XYZAL) 5 MG tablet Take 1 tablet (5 mg total) by mouth every evening. 90 tablet 0   linaclotide (LINZESS) 72 MCG capsule Take 1 capsule (72 mcg total) by mouth daily as needed (constipation). 90 capsule 0   meclizine (ANTIVERT) 25 MG tablet TAKE ONE TABLET BY MOUTH EVERY 8 HOURS AS NEEDED FOR DIZZINESS 30 tablet 0   methocarbamol (ROBAXIN) 500 MG tablet TAKE ONE TABLET BY MOUTH every EIGHT hours AS NEEDED FOR MUSCLE SPASMS 20 tablet 0   montelukast (SINGULAIR) 10 MG tablet Take 1 tablet (10 mg  total) by mouth at bedtime. 30 tablet 3   OnabotulinumtoxinA (BOTOX IJ) Inject as directed every 3 (three) months.     ondansetron (ZOFRAN ODT) 4 MG disintegrating tablet Take 1 tablet (4 mg total) by mouth every 6 (six) hours as needed for nausea or vomiting. 20 tablet 0   oxyCODONE (OXY IR/ROXICODONE) 5 MG immediate release tablet      pantoprazole (PROTONIX) 40 MG tablet Take 40 mg by mouth 2 (two) times daily.  1   QUEtiapine (SEROQUEL) 300 MG tablet TAKE ONE TABLET BY MOUTH EVERYDAY AT BEDTIME (Patient taking differently: Take 300 mg by mouth at bedtime.) 90 tablet 3   rizatriptan (MAXALT-MLT) 10 MG disintegrating tablet Take 1 tablet earliest onset of migraine.  May repeat in 2 hours if needed.  Maximum 2 tablets in 24 hours 10 tablet 5   Semaglutide,0.25 or 0.5MG /DOS, (OZEMPIC, 0.25 OR 0.5 MG/DOSE,) 2 MG/1.5ML SOPN Inject 0.5 mg into the skin once a week. 1.5 mL 0   Vitamin D, Ergocalciferol, (DRISDOL) 1.25 MG (50000 UNIT) CAPS capsule TAKE ONE CAPSULE BY  MOUTH EVERY 7 DAYS 4 capsule 0   zolpidem (AMBIEN) 10 MG tablet TAKE ONE TABLET BY MOUTH EVERYDAY AT BEDTIME 30 tablet 4   No current facility-administered medications on file prior to visit.    ALLERGIES: Allergies  Allergen Reactions   Rocephin [Ceftriaxone] Anaphylaxis   Morphine And Related Itching and Nausea And Vomiting    Doesn't work   Prednisone Itching and Swelling   Sulfa Antibiotics Itching and Swelling   Amitriptyline Other (See Comments)   Penicillin G Sodium Other (See Comments)    FAMILY HISTORY: Family History  Problem Relation Age of Onset   Breast cancer Paternal 30    Breast cancer Paternal Grandmother    Obesity Mother    Diabetes Father    High blood pressure Father    Sudden death Father       Objective:  Blood pressure (!) 142/86, pulse (!) 108, height 5\' 1"  (1.549 m), weight 267 lb 6.4 oz (121.3 kg), SpO2 97 %. General: No acute distress.  Patient appears well-groomed.    Metta Clines, DO  CC: Shirline Frees, MD

## 2021-06-20 NOTE — Patient Instructions (Signed)
Continue Botox Continue rizatriptan.  Limit use of pain relievers to no more than 2 days out of week to prevent risk of rebound or medication-overuse headache. Follow up for next routine office visit in 6 months. Will give you a Toradol shot today.

## 2021-06-21 DIAGNOSIS — J301 Allergic rhinitis due to pollen: Secondary | ICD-10-CM | POA: Diagnosis not present

## 2021-06-21 DIAGNOSIS — J3081 Allergic rhinitis due to animal (cat) (dog) hair and dander: Secondary | ICD-10-CM | POA: Diagnosis not present

## 2021-06-21 DIAGNOSIS — J3089 Other allergic rhinitis: Secondary | ICD-10-CM | POA: Diagnosis not present

## 2021-06-21 MED ORDER — TRIAMCINOLONE ACETONIDE 40 MG/ML IJ SUSP
60.0000 mg | INTRAMUSCULAR | Status: AC | PRN
  Administered 2021-06-08: 60 mg via INTRA_ARTICULAR

## 2021-06-21 MED ORDER — BUPIVACAINE HCL 0.25 % IJ SOLN
4.0000 mL | INTRAMUSCULAR | Status: AC | PRN
  Administered 2021-06-08: 4 mL via INTRA_ARTICULAR

## 2021-06-28 ENCOUNTER — Ambulatory Visit (INDEPENDENT_AMBULATORY_CARE_PROVIDER_SITE_OTHER): Payer: Medicare HMO | Admitting: Family Medicine

## 2021-06-28 ENCOUNTER — Other Ambulatory Visit: Payer: Self-pay

## 2021-06-28 ENCOUNTER — Encounter (INDEPENDENT_AMBULATORY_CARE_PROVIDER_SITE_OTHER): Payer: Self-pay | Admitting: Family Medicine

## 2021-06-28 VITALS — BP 139/87 | HR 93 | Temp 98.0°F | Ht 61.0 in | Wt 264.0 lb

## 2021-06-28 DIAGNOSIS — Z6841 Body Mass Index (BMI) 40.0 and over, adult: Secondary | ICD-10-CM

## 2021-06-28 DIAGNOSIS — G8929 Other chronic pain: Secondary | ICD-10-CM

## 2021-06-28 DIAGNOSIS — J3089 Other allergic rhinitis: Secondary | ICD-10-CM | POA: Diagnosis not present

## 2021-06-28 DIAGNOSIS — J301 Allergic rhinitis due to pollen: Secondary | ICD-10-CM | POA: Diagnosis not present

## 2021-06-28 DIAGNOSIS — E1169 Type 2 diabetes mellitus with other specified complication: Secondary | ICD-10-CM | POA: Diagnosis not present

## 2021-06-28 DIAGNOSIS — J3081 Allergic rhinitis due to animal (cat) (dog) hair and dander: Secondary | ICD-10-CM | POA: Diagnosis not present

## 2021-06-28 DIAGNOSIS — F4323 Adjustment disorder with mixed anxiety and depressed mood: Secondary | ICD-10-CM | POA: Diagnosis not present

## 2021-06-29 ENCOUNTER — Telehealth: Payer: Self-pay | Admitting: *Deleted

## 2021-06-29 NOTE — Telephone Encounter (Signed)
Patient called back and said she would like to proceed with referral. She is already scheduled on 08/10/21 at urology. Office notes faxed.

## 2021-06-29 NOTE — Progress Notes (Signed)
Chief Complaint:   OBESITY Virginia Luna is here to discuss her progress with her obesity treatment plan along with follow-up of her obesity related diagnoses. See Medical Weight Management Flowsheet for complete bioelectrical impedance results.  Today's visit was #: 30 Starting weight: 288 lbs Starting date: 08/27/2019 Weight change since last visit: 0 Total lbs lost to date: 24 lbs Total weight loss percentage to date: -8.33%  Nutrition Plan: Practicing portion control and making smarter food choices, such as increasing vegetables and decreasing simple carbohydrates for 0% of the time. Activity: None. Anti-obesity medications: Ozempic 0.5 mg subcutaneously weekly. Reported side effects: None.  Interim History: Virginia Luna has been to see Neurology - reviewed visit.  She is scheduled for an eye appointment soon.  She endorses situational anxiety/depression due to friends/family deaths.  Assessment/Plan:   1. Type 2 diabetes mellitus with other specified complication, without long-term current use of insulin (HCC) Diabetes Mellitus: Not at goal. Medication: Ozempic 0.5 mg subcutaneously weekly.   Plan:  Continue Ozempic 0.5 mg subcutaneously weekly. The patient will continue to focus on protein-rich, low simple carbohydrate foods. We reviewed the importance of hydration, regular exercise for stress reduction, and restorative sleep.   Lab Results  Component Value Date   HGBA1C 6.2 (H) 02/14/2021   HGBA1C 6.0 (H) 10/12/2020   HGBA1C 6.4 (H) 02/08/2020   Lab Results  Component Value Date   LDLCALC 56 02/14/2021   CREATININE 0.65 02/14/2021   2. Other chronic pain Stable. She has an upcoming appointment with PMR for evaluation and treatment of chronic pain syndrome. She has several specialists treating pain. My hope is that she will have one person guiding and maximizing her pain management plan.  This issue directly impacts care plan for optimization of BMI and metabolic health as it  impacts the patient's ability to make lifestyle changes. We will continue to monitor symptoms as they relate to her weight loss journey.  3. Situational mixed anxiety and depressive disorder Virginia Luna has been dealing with family/friends deaths. Behavior modification techniques were discussed today to help deal with emotional/non-hunger eating behaviors.  4. Obesity, current BMI 50.0  Course: Virginia Luna is currently in the action stage of change. As such, her goal is to continue with weight loss efforts.   Nutrition goals: She has agreed to practicing portion control and making smarter food choices, such as increasing vegetables and decreasing simple carbohydrates.   Exercise goals:  Increase NEAT.  Behavioral modification strategies: increasing lean protein intake, decreasing simple carbohydrates, increasing vegetables, and increasing water intake.  Virginia Luna has agreed to follow-up with our clinic in 4 weeks. She was informed of the importance of frequent follow-up visits to maximize her success with intensive lifestyle modifications for her multiple health conditions.   Objective:   Blood pressure 139/87, pulse 93, temperature 98 F (36.7 C), temperature source Oral, height 5\' 1"  (1.549 m), weight 264 lb (119.7 kg), SpO2 98 %. Body mass index is 49.88 kg/m.  General: Cooperative, alert, well developed, in no acute distress. HEENT: Conjunctivae and lids unremarkable. Cardiovascular: Regular rhythm.  Lungs: Normal work of breathing. Neurologic: No focal deficits.   Lab Results  Component Value Date   CREATININE 0.65 02/14/2021   BUN 12 02/14/2021   NA 144 02/14/2021   K 3.8 02/14/2021   CL 101 02/14/2021   CO2 24 02/14/2021   Lab Results  Component Value Date   ALT 10 02/14/2021   AST 4 02/14/2021   GGT 75 (H) 02/08/2020  ALKPHOS 136 (H) 02/14/2021   BILITOT 0.5 02/14/2021   Lab Results  Component Value Date   HGBA1C 6.2 (H) 02/14/2021   HGBA1C 6.0 (H) 10/12/2020    HGBA1C 6.4 (H) 02/08/2020   Lab Results  Component Value Date   INSULIN 12.4 02/14/2021   INSULIN 10.9 10/12/2020   INSULIN 18.5 02/08/2020   INSULIN 15.9 08/27/2019   Lab Results  Component Value Date   TSH 0.458 02/14/2021   Lab Results  Component Value Date   CHOL 139 02/14/2021   HDL 70 02/14/2021   LDLCALC 56 02/14/2021   TRIG 66 02/14/2021   CHOLHDL 2.0 02/14/2021   Lab Results  Component Value Date   VD25OH 42.6 02/14/2021   VD25OH 38.1 10/12/2020   VD25OH 31.1 02/08/2020   Lab Results  Component Value Date   WBC 8.9 02/14/2021   HGB 11.6 02/14/2021   HCT 36.2 02/14/2021   MCV 84 02/14/2021   PLT 463 (H) 02/14/2021   Lab Results  Component Value Date   IRON 34 02/14/2021   TIBC 329 02/14/2021   FERRITIN 50 02/14/2021   Attestation Statements:   Reviewed by clinician on day of visit: allergies, medications, problem list, medical history, surgical history, family history, social history, and previous encounter notes.  I, Water quality scientist, CMA, am acting as transcriptionist for Briscoe Deutscher, DO  I have reviewed the above documentation for accuracy and completeness, and I agree with the above. -  Briscoe Deutscher, DO, MS, FAAFP, DABOM - Family and Bariatric Medicine.

## 2021-06-29 NOTE — Telephone Encounter (Signed)
Left message for patient to call. I received a fax from [facility] requesting office notes faxed to them regarding referral. I see Dr.Jertson did refer patient to urology at [facility] on 10/06/20, but it appears staff was not notified to fax notes. I asked patient to call me to see if she would like to proceed with referral.

## 2021-07-03 ENCOUNTER — Encounter: Payer: Self-pay | Admitting: Physical Medicine and Rehabilitation

## 2021-07-03 ENCOUNTER — Other Ambulatory Visit: Payer: Self-pay

## 2021-07-03 ENCOUNTER — Encounter: Payer: Medicare HMO | Attending: Physical Medicine and Rehabilitation | Admitting: Physical Medicine and Rehabilitation

## 2021-07-03 VITALS — BP 143/91 | HR 107 | Temp 99.6°F | Ht 61.0 in | Wt 266.8 lb

## 2021-07-03 DIAGNOSIS — E871 Hypo-osmolality and hyponatremia: Secondary | ICD-10-CM | POA: Diagnosis not present

## 2021-07-03 DIAGNOSIS — M7918 Myalgia, other site: Secondary | ICD-10-CM | POA: Insufficient documentation

## 2021-07-03 DIAGNOSIS — Z6841 Body Mass Index (BMI) 40.0 and over, adult: Secondary | ICD-10-CM | POA: Diagnosis not present

## 2021-07-03 DIAGNOSIS — M1712 Unilateral primary osteoarthritis, left knee: Secondary | ICD-10-CM | POA: Diagnosis not present

## 2021-07-03 DIAGNOSIS — M1711 Unilateral primary osteoarthritis, right knee: Secondary | ICD-10-CM | POA: Insufficient documentation

## 2021-07-03 DIAGNOSIS — M797 Fibromyalgia: Secondary | ICD-10-CM | POA: Diagnosis not present

## 2021-07-03 MED ORDER — OXCARBAZEPINE 300 MG PO TABS: 300.0000 mg | ORAL_TABLET | Freq: Every day | ORAL | 5 refills | Status: DC

## 2021-07-03 NOTE — Patient Instructions (Signed)
Pt is a 60 yr old female with hx of fibromyalgia- dx'd at age 58, DM2,- Ac1 6.2;  asthma; HTN, OSA, BMI is 50; Nodule in L superior cerebellum. Also has migraines- intractable and daily; has trace aura;  No kidney issues- Cr 0.65 and BUN 15 in 6/22. Also end stage OA of B/L knees medially and R hip pain. Also has myofascial pain syndrome.    Seeing Dr Juleen China for weight management- - has lost 25-30 lbs so far- AWESOME!!!   2. Do suggest pedal bike- stationary- one can add tension to increase resistance- need to do 5 days minimum per week- goal 30 minutes/day, but start 10-15 minutes initially and work up.   3. Fibromyalgia treatments 3. Dr Sharion Balloon- The survivor's Handbook to Fibromyalgia and Myofascial Pain Syndrome Theracane- 2-4 minutes on each trigger point- hold firm pressure, don't massage; can get for $20-30 online- will never need replacing- Youtube has videos how to use.  Tennis balls- 2-5 minutes on each trigger point- buttocks, back of thighs, calves, low and mid back- can throw in dryer x1 to make softer. Magnesium 400 mg 1-3x/day for muscle tightness- titrate up until loose stools Lidocaine patches- 2-3 patches 12 hrs on;12 hrs off- areas of most pain- never on bottom of feet Rolling pin- can use on calves, thighs and arms, and buttocks- roll slowly over muscles firmly- roll towards heart  4. Discussed side effect profiles of Keppra vs Trileptal- - vs increasing gabapentin.   5. So will try Trileptal-/Oxcarbazepine- 300 mg nightly x 1 week, then 600 mg nightly x 1 weke, then 900 mg nightly- for fibromyalgia.   6. Get a BMP in 1 month- can get drawn when sees Dr Juleen China- but is ordered, to make sure Sodium doesn't get too low.    7. Will schedule trigger point injections. At next appointment. Will see if can put on wait list for this.    8. F/U in 3 months. - PCP to con't pain meds/opiates.

## 2021-07-03 NOTE — Progress Notes (Signed)
Subjective:    Patient ID: Virginia Luna, female    DOB: Jun 12, 1961, 60 y.o.   MRN: [id number]  HPI  Pt is a 60 yr old female with hx of fibromyalgia- dx'd at age 68, DM2,- Ac1 6.2;  asthma; HTN, OSA, BMI is 50; Nodule in L superior cerebellum. Also has migraines. No kidney issues- Cr 0.65 and BUN 15 in 6/22.    Sent here for R hip pain evaluation per chart- but pt c/o all over pain from fibromyalgia.      Dr Kenton Kingfisher- PCP writes for pain meds.  Hydrocodone and Oxycodone.  Takes opiates for osteoarthritis pain as well as fibromyaliga.   Hx of Sciatica issues-  L>R- had back injections x2- and hx of back surgery by Dr Ronnald Ramp- L5 laminectomy-  Spondylosis- getting more severe based on MRI 05/20/21 of lumbar spine.   Dr Percell Miller did surgery  in spring on R hand- was fractured in 5 places per pt.   Migraines- gets Botox shots- Dr Tomi Likens- only had 2 rounds- q3 months-  Has helped " A little". Not a lot.  Also has Rx for Malxalt.   Also on Ambien for sleep.  Takes Robaxin for muscle relaxant.   Current meds: Norco- 5/325 mg- takes 1 pills nightly; Oxyocodne- rarely use it.   Linzess- doesn't take every day- for constipation; Robaxin mainly 1x.day.  Diclofenac 1x/day Flexeril 1x/day Takes Maxalt ~ every other day.    Migraines every day- when tried Topamax- and some other BP meds- maybe Atenolol- -didn't work.    Tried: Botox- has helped some- can sit in room and doesn't have to be dark anymore- less intense and less frequent occ.   Gabapentin 600 mg BID- doesn't make sleepy; and never tried higher dose.  Also on Diclofenac-  Tried Topamax/etc for migraines Emgality wasn't covered by insurance- not sure about other meds- Emgality- took for 2 months- was starting to help.  Lyrica- tried- hair fell out, weird dreams/bad;  Cymbalta- hair fell out in handfuls. And didn't work.  Never tried Trileptal or Keppra for nerve pain that she remembers.  Doesn't remember if has tried  trigger point injections.    Most painful spots- migraines; R hip groin and lateral hip; R hip xray joint space looks OK per xray 10/22- got injection done by Dr Ernestina Patches- xray guidance-  10/22. And B/L knees  L knee has bone on bone medially on L knee;  And R knee moderate to severe DJD with bone on bone changes and spurring.   Also had Dr Marlou Sa do injections into B/L knees 05/24/21.   Still feeling pain in L knee-stabbing pain.  Still having R hip pain. But a little better.  Also has muscle cramping in feet- they "ball up". At least 1x/week.  Numb L 5th digit.    Pain Inventory Average Pain 9 Pain Right Now 8 My pain is constant, sharp, burning, and stabbing  In the last 24 hours, has pain interfered with the following? General activity 9 Relation with others 9 Enjoyment of life 9 What TIME of day is your pain at its worst? daytime, evening, and night Sleep (in general) Poor  Pain is worse with: walking, sitting, standing, and some activites Pain improves with: medication Relief from Meds: 7  walk without assistance how many minutes can you walk? 15-20 ability to climb steps?  yes Do you have any goals in this area?  yes  disabled: date disabled 2002  weakness numbness tremor trouble  walking dizziness  Any changes since last visit?  no  Any changes since last visit?  no    Family History  Problem Relation Age of Onset   Breast cancer Paternal 75    Breast cancer Paternal Grandmother    Obesity Mother    Diabetes Father    High blood pressure Father    Sudden death Father    Social History   Socioeconomic History   Marital status: Single    Spouse name: Not on file   Number of children: Not on file   Years of education: Not on file   Highest education level: Not on file  Occupational History   Occupation: disabled    Comment: volunteer at daycare  Tobacco Use   Smoking status: Never   Smokeless tobacco: Never  Vaping Use   Vaping Use: Never  used  Substance and Sexual Activity   Alcohol use: No   Drug use: No   Sexual activity: Not Currently  Other Topics Concern   Not on file  Social History Narrative   Not on file   Social Determinants of Health   Financial Resource Strain: Not on file  Food Insecurity: Not on file  Transportation Needs: Not on file  Physical Activity: Not on file  Stress: Not on file  Social Connections: Not on file   Past Surgical History:  Procedure Laterality Date   BREAST BIOPSY     BREAST EXCISIONAL BIOPSY     BREAST LUMPECTOMY WITH RADIOACTIVE SEED LOCALIZATION Bilateral 11/24/2014   Procedure: BILATERAL BREAST LUMPECTOMY WITH RADIOACTIVE SEED LOCALIZATION;  Surgeon: [name], MD;  Location: [facility];  Service: General;  Laterality: Bilateral;   CHOLECYSTECTOMY     COLONOSCOPY     DILATION AND CURETTAGE OF UTERUS     ESOPHAGOGASTRODUODENOSCOPY (EGD) WITH PROPOFOL N/A 07/23/2016   Procedure: ESOPHAGOGASTRODUODENOSCOPY (EGD) WITH PROPOFOL;  Surgeon: Laurence Spates, MD;  Location: [facility];  Service: Endoscopy;  Laterality: N/A;   ESOPHAGOGASTRODUODENOSCOPY (EGD) WITH PROPOFOL N/A 06/11/2018   Procedure: ESOPHAGOGASTRODUODENOSCOPY (EGD) WITH PROPOFOL;  Surgeon: Laurence Spates, MD;  Location: [facility];  Service: Endoscopy;  Laterality: N/A;   LUMBAR LAMINECTOMY  2010   ORIF WRIST FRACTURE Right 11/24/2020   Procedure: OPEN REDUCTION INTERNAL FIXATION RIGHT WRIST FRACTURE;  Surgeon: Renette Butters, MD;  Location: [facility];  Service: Orthopedics;  Laterality: Right;   UMBILICAL HERNIA REPAIR     age 2   UPPER GI ENDOSCOPY     Past Medical History:  Diagnosis Date   Allergies    Anemia    Arthritis    Asthma    Back pain    Chronic pain    Complication of anesthesia    woke up during colonoscopy   Diabetes ([place])    Diabetes mellitus without complication (HCC)    Edema, lower extremity    Fibroid    Fibromyalgia    Chronic   GERD (gastroesophageal reflux  disease)    Headache    migraines   High blood pressure    History of hiatal hernia    History of stomach ulcers    IBS (irritable bowel syndrome)    Joint pain    Sleep apnea    did use cpap-lost 25lb-says she does not need it   NO CPAP   Wears contact lenses    Ht 5\' 1"  (1.549 m)   Wt 266 lb 12.8 oz (121 kg)   LMP  (LMP Unknown)   BMI 50.41  kg/m   Opioid Risk Score:   Fall Risk Score:  `1  Depression screen PHQ 2/9  Depression screen [facility] 2/9 07/03/2021 08/27/2019 03/13/2017  Decreased Interest 2 3 0  Down, Depressed, Hopeless 0 0 0  PHQ - 2 Score 2 3 0  Altered sleeping 3 1 -  Tired, decreased energy 3 1 -  Change in appetite 1 1 -  Feeling bad or failure about yourself  0 1 -  Trouble concentrating 2 0 -  Moving slowly or fidgety/restless 0 0 -  Suicidal thoughts 0 0 -  PHQ-9 Score 11 7 -  Difficult doing work/chores Very difficult Somewhat difficult -  Some recent data might be hidden     Review of Systems  Constitutional:  Positive for appetite change.  HENT: Negative.    Eyes: Negative.   Respiratory: Negative.    Cardiovascular: Negative.   Gastrointestinal:  Positive for nausea.  Endocrine: Negative.   Genitourinary: Negative.   Musculoskeletal:  Positive for back pain, gait problem, joint swelling and neck pain.  Skin: Negative.   Allergic/Immunologic: Negative.   Neurological:  Positive for dizziness, tremors, weakness, light-headedness, numbness and headaches.  Hematological: Negative.   Psychiatric/Behavioral:  Positive for sleep disturbance.       Objective:   Physical Exam  Awake, alert, appropriate, walking very gingerly due to pain; BMI 50; NAD MS: UE- deltoids 4+/5- painful; otherwise 5-/5 due to pain B/L in UE LE- HF 5-/5 on R;pain?; but otherwise 5/5 in Les in KE/KF/DF and PF B/L   Trigger points, esp in scalenes, upper traps, levators, rhomboids, pecs, splenius capitus as well as anterior tibialis; 1st dorsal interossei in hands B/L   Also has tender points c/w fibromyalgia  Gait- antalgic- gingerly walking- like feet/knees and hips hurt        Assessment & Plan:    Pt is a 60 yr old female with hx of fibromyalgia- dx'd at age 9, DM2,- Ac1 6.2;  asthma; HTN, OSA, BMI is 50; Nodule in L superior cerebellum. Also has migraines- intractable and daily; has trace aura;  No kidney issues- Cr 0.65 and BUN 15 in 6/22. Also end stage OA of B/L knees medially and R hip pain. Also has myofascial pain syndrome.    Seeing Dr Juleen China for weight management- - has lost 25-30 lbs so far- AWESOME!!!   2. Do suggest pedal bike- stationary- one can add tension to increase resistance- need to do 5 days minimum per week- goal 30 minutes/day, but start 10-15 minutes initially and work up.   3. Fibromyalgia treatments 3. Dr Sharion Balloon- The survivor's Handbook to Fibromyalgia and Myofascial Pain Syndrome Theracane- 2-4 minutes on each trigger point- hold firm pressure, don't massage; can get for $20-30 online- will never need replacing- Youtube has videos how to use.  Tennis balls- 2-5 minutes on each trigger point- buttocks, back of thighs, calves, low and mid back- can throw in dryer x1 to make softer. Magnesium 400 mg 1-3x/day for muscle tightness- titrate up until loose stools Lidocaine patches- 2-3 patches 12 hrs on;12 hrs off- areas of most pain- never on bottom of feet Rolling pin- can use on calves, thighs and arms, and buttocks- roll slowly over muscles firmly- roll towards heart  4. Discussed side effect profiles of Keppra vs Trileptal- - vs increasing gabapentin.   5. So will try Trileptal-/Oxcarbazepine- 300 mg nightly x 1 week, then 600 mg nightly x 1 weke, then 900 mg nightly- for fibromyalgia.   6.  Get a BMP in 1 month- can get drawn when sees Dr Juleen China- but is ordered, to make sure Sodium doesn't get too low.    7. Will schedule trigger point injections. At next appointment. Will see if can put on wait list for  this.    8. F/U in 3 months. - PCP to con't pain meds/opiates.    I spent a total of 54 minutes on total visit- as detailed above- focus on FMS/Myofascial pain.

## 2021-07-05 DIAGNOSIS — J3081 Allergic rhinitis due to animal (cat) (dog) hair and dander: Secondary | ICD-10-CM | POA: Diagnosis not present

## 2021-07-05 DIAGNOSIS — J3089 Other allergic rhinitis: Secondary | ICD-10-CM | POA: Diagnosis not present

## 2021-07-05 DIAGNOSIS — J301 Allergic rhinitis due to pollen: Secondary | ICD-10-CM | POA: Diagnosis not present

## 2021-07-11 DIAGNOSIS — E119 Type 2 diabetes mellitus without complications: Secondary | ICD-10-CM | POA: Diagnosis not present

## 2021-07-11 DIAGNOSIS — E7800 Pure hypercholesterolemia, unspecified: Secondary | ICD-10-CM | POA: Diagnosis not present

## 2021-07-11 DIAGNOSIS — K219 Gastro-esophageal reflux disease without esophagitis: Secondary | ICD-10-CM | POA: Diagnosis not present

## 2021-07-11 DIAGNOSIS — I10 Essential (primary) hypertension: Secondary | ICD-10-CM | POA: Diagnosis not present

## 2021-07-11 DIAGNOSIS — J4520 Mild intermittent asthma, uncomplicated: Secondary | ICD-10-CM | POA: Diagnosis not present

## 2021-07-11 DIAGNOSIS — D649 Anemia, unspecified: Secondary | ICD-10-CM | POA: Diagnosis not present

## 2021-07-19 ENCOUNTER — Other Ambulatory Visit: Payer: Self-pay | Admitting: Psychiatry

## 2021-07-19 ENCOUNTER — Ambulatory Visit (INDEPENDENT_AMBULATORY_CARE_PROVIDER_SITE_OTHER): Payer: Medicare HMO | Admitting: Family Medicine

## 2021-07-19 DIAGNOSIS — K219 Gastro-esophageal reflux disease without esophagitis: Secondary | ICD-10-CM | POA: Diagnosis not present

## 2021-07-19 DIAGNOSIS — E119 Type 2 diabetes mellitus without complications: Secondary | ICD-10-CM | POA: Diagnosis not present

## 2021-07-19 DIAGNOSIS — I10 Essential (primary) hypertension: Secondary | ICD-10-CM | POA: Diagnosis not present

## 2021-07-19 DIAGNOSIS — J3081 Allergic rhinitis due to animal (cat) (dog) hair and dander: Secondary | ICD-10-CM | POA: Diagnosis not present

## 2021-07-19 DIAGNOSIS — J301 Allergic rhinitis due to pollen: Secondary | ICD-10-CM | POA: Diagnosis not present

## 2021-07-19 DIAGNOSIS — E7800 Pure hypercholesterolemia, unspecified: Secondary | ICD-10-CM | POA: Diagnosis not present

## 2021-07-19 DIAGNOSIS — F5105 Insomnia due to other mental disorder: Secondary | ICD-10-CM

## 2021-07-19 DIAGNOSIS — M797 Fibromyalgia: Secondary | ICD-10-CM | POA: Diagnosis not present

## 2021-07-19 DIAGNOSIS — F419 Anxiety disorder, unspecified: Secondary | ICD-10-CM

## 2021-07-19 DIAGNOSIS — R600 Localized edema: Secondary | ICD-10-CM | POA: Diagnosis not present

## 2021-07-19 DIAGNOSIS — F5101 Primary insomnia: Secondary | ICD-10-CM | POA: Diagnosis not present

## 2021-07-19 DIAGNOSIS — J4520 Mild intermittent asthma, uncomplicated: Secondary | ICD-10-CM | POA: Diagnosis not present

## 2021-07-19 DIAGNOSIS — G894 Chronic pain syndrome: Secondary | ICD-10-CM | POA: Diagnosis not present

## 2021-07-19 DIAGNOSIS — J3089 Other allergic rhinitis: Secondary | ICD-10-CM | POA: Diagnosis not present

## 2021-07-20 ENCOUNTER — Other Ambulatory Visit: Payer: Self-pay

## 2021-07-20 ENCOUNTER — Encounter (INDEPENDENT_AMBULATORY_CARE_PROVIDER_SITE_OTHER): Payer: Self-pay | Admitting: Family Medicine

## 2021-07-20 ENCOUNTER — Ambulatory Visit (INDEPENDENT_AMBULATORY_CARE_PROVIDER_SITE_OTHER): Payer: Medicare HMO | Admitting: Family Medicine

## 2021-07-20 VITALS — BP 110/75 | HR 99 | Temp 97.5°F | Ht 61.0 in | Wt 266.0 lb

## 2021-07-20 DIAGNOSIS — J4540 Moderate persistent asthma, uncomplicated: Secondary | ICD-10-CM

## 2021-07-20 DIAGNOSIS — E1169 Type 2 diabetes mellitus with other specified complication: Secondary | ICD-10-CM | POA: Diagnosis not present

## 2021-07-20 DIAGNOSIS — G8929 Other chronic pain: Secondary | ICD-10-CM

## 2021-07-20 DIAGNOSIS — Z6841 Body Mass Index (BMI) 40.0 and over, adult: Secondary | ICD-10-CM

## 2021-07-20 DIAGNOSIS — I152 Hypertension secondary to endocrine disorders: Secondary | ICD-10-CM

## 2021-07-20 DIAGNOSIS — R0609 Other forms of dyspnea: Secondary | ICD-10-CM

## 2021-07-20 DIAGNOSIS — E785 Hyperlipidemia, unspecified: Secondary | ICD-10-CM

## 2021-07-20 DIAGNOSIS — E1159 Type 2 diabetes mellitus with other circulatory complications: Secondary | ICD-10-CM

## 2021-07-22 DIAGNOSIS — E119 Type 2 diabetes mellitus without complications: Secondary | ICD-10-CM | POA: Diagnosis not present

## 2021-07-24 ENCOUNTER — Ambulatory Visit (INDEPENDENT_AMBULATORY_CARE_PROVIDER_SITE_OTHER): Payer: Medicare HMO | Admitting: Neurology

## 2021-07-24 ENCOUNTER — Encounter: Payer: Self-pay | Admitting: Neurology

## 2021-07-24 VITALS — BP 125/83 | HR 101 | Ht 61.0 in | Wt 267.5 lb

## 2021-07-24 DIAGNOSIS — E785 Hyperlipidemia, unspecified: Secondary | ICD-10-CM

## 2021-07-24 DIAGNOSIS — K760 Fatty (change of) liver, not elsewhere classified: Secondary | ICD-10-CM | POA: Diagnosis not present

## 2021-07-24 DIAGNOSIS — M7918 Myalgia, other site: Secondary | ICD-10-CM

## 2021-07-24 DIAGNOSIS — G8929 Other chronic pain: Secondary | ICD-10-CM | POA: Diagnosis not present

## 2021-07-24 DIAGNOSIS — I152 Hypertension secondary to endocrine disorders: Secondary | ICD-10-CM

## 2021-07-24 DIAGNOSIS — E1159 Type 2 diabetes mellitus with other circulatory complications: Secondary | ICD-10-CM

## 2021-07-24 DIAGNOSIS — Z6841 Body Mass Index (BMI) 40.0 and over, adult: Secondary | ICD-10-CM

## 2021-07-24 DIAGNOSIS — G4733 Obstructive sleep apnea (adult) (pediatric): Secondary | ICD-10-CM

## 2021-07-24 DIAGNOSIS — J4540 Moderate persistent asthma, uncomplicated: Secondary | ICD-10-CM

## 2021-07-24 DIAGNOSIS — E1169 Type 2 diabetes mellitus with other specified complication: Secondary | ICD-10-CM | POA: Diagnosis not present

## 2021-07-24 DIAGNOSIS — Z789 Other specified health status: Secondary | ICD-10-CM

## 2021-07-24 DIAGNOSIS — G4701 Insomnia due to medical condition: Secondary | ICD-10-CM | POA: Diagnosis not present

## 2021-07-24 NOTE — Progress Notes (Signed)
Chief Complaint:   OBESITY Virginia Luna is here to discuss her progress with her obesity treatment plan along with follow-up of her obesity related diagnoses. See Medical Weight Management Flowsheet for complete bioelectrical impedance results.  Today's visit was #: 58 Starting weight: 288 lbs Starting date: 08/27/2019 Weight change since last visit: +2 lbs Total lbs lost to date: 22 lbs Total weight loss percentage to date: -7.64%   Nutrition Plan: Practicing portion control and making smarter food choices, such as increasing vegetables and decreasing simple carbohydrates for 0% of the time. Activity: Increased activity. Anti-obesity medications: Ozempic 0.5 mg subcutaneously weekly. Reported side effects: None.  Interim History: Virginia Luna endorses nausea and took antinausea medication.  She says she started using the bike.  She is expecting to get a lidocaine injection.  Will see Neurology soon for trigger point injections.  She had dizziness x 4 episodes when standing.  Assessment/Plan:   1. Type 2 diabetes mellitus with other specified complication, without long-term current use of insulin (HCC) Diabetes Mellitus: Not at goal. Medication: Ozempic 0.5 mg subcutaneously weekly.   Plan:  Continue Ozempic 0.5 mg subcutaneously weekly. The patient will continue to focus on protein-rich, low simple carbohydrate foods. We reviewed the importance of hydration, regular exercise for stress reduction, and restorative sleep.   Lab Results  Component Value Date   HGBA1C 6.2 (H) 02/14/2021   HGBA1C 6.0 (H) 10/12/2020   HGBA1C 6.4 (H) 02/08/2020   Lab Results  Component Value Date   LDLCALC 56 02/14/2021   CREATININE 0.65 02/14/2021   2. Other chronic pain Stable. She has an upcoming appointment with PMR for evaluation and treatment of chronic pain syndrome. She has several specialists treating pain. My hope is that she will have one person guiding and maximizing her pain management plan.    This issue directly impacts care plan for optimization of BMI and metabolic health as it impacts the patient's ability to make lifestyle changes. We will continue to monitor symptoms as they relate to her weight loss journey.  3. Hypertension associated with diabetes ([place]) At goal. Medications: Norvasc 5 mg daily.   Plan: Avoid buying foods that are: processed, frozen, or prepackaged to avoid excess salt. We will watch for signs of hypotension as she continues lifestyle modifications.  BP Readings from Last 3 Encounters:  07/24/21 125/83  07/20/21 110/75  07/03/21 (!) 143/91   Lab Results  Component Value Date   CREATININE 0.65 02/14/2021   4. Hyperlipidemia associated with type 2 diabetes mellitus ([place]) Course: Controlled. Lipid-lowering medications: Lipitor 10 mg daily.   Plan: Dietary changes: Increase soluble fiber, decrease simple carbohydrates, decrease saturated fat. Exercise changes: Moderate to vigorous-intensity aerobic activity 150 minutes per week or as tolerated. We will continue to monitor along with PCP/specialists as it pertains to her weight loss journey.  Lab Results  Component Value Date   CHOL 139 02/14/2021   HDL 70 02/14/2021   LDLCALC 56 02/14/2021   TRIG 66 02/14/2021   CHOLHDL 2.0 02/14/2021   Lab Results  Component Value Date   ALT 10 02/14/2021   AST 4 02/14/2021   GGT 75 (H) 02/08/2020   ALKPHOS 136 (H) 02/14/2021   BILITOT 0.5 02/14/2021   The 10-year ASCVD risk score (Arnett DK, et al., 2019) is: 8.5%   Values used to calculate the score:     Age: 60 years     Sex: Female     Is Non-Hispanic African American: Yes  Diabetic: Yes     Tobacco smoker: No     Systolic Blood Pressure: 626 mmHg     Is BP treated: Yes     HDL Cholesterol: 70 mg/dL     Total Cholesterol: 139 mg/dL  5. Moderate persistent asthma, uncomplicated Virginia Luna is taking Sigulair, Breo Ellipta, and albuterol for asthma. We will continue to monitor symptoms as they  relate to her weight loss journey.  6. Dyspnea with exertion Will order 2D echo.   7. Class 3 severe obesity with serious comorbidity and body mass index (BMI) of 50.0 to 59.9 in adult, unspecified obesity type [facility])  Course: Virginia Luna is currently in the action stage of change. As such, her goal is to continue with weight loss efforts.   Nutrition goals: She has agreed to practicing portion control and making smarter food choices, such as increasing vegetables and decreasing simple carbohydrates.   Exercise goals:  As is.  Behavioral modification strategies: increasing lean protein intake, decreasing simple carbohydrates, increasing vegetables, increasing water intake, and decreasing liquid calories.  Virginia Luna has agreed to follow-up with our clinic in 2 weeks. She was informed of the importance of frequent follow-up visits to maximize her success with intensive lifestyle modifications for her multiple health conditions.   Objective:   Blood pressure 110/75, pulse 99, temperature (!) 97.5 F (36.4 C), temperature source Oral, height 5\' 1"  (1.549 m), weight 266 lb (120.7 kg), SpO2 96 %. Body mass index is 50.26 kg/m.  General: Cooperative, alert, well developed, in no acute distress. HEENT: Conjunctivae and lids unremarkable. Cardiovascular: Regular rhythm.  Lungs: Normal work of breathing. Neurologic: No focal deficits.   Lab Results  Component Value Date   CREATININE 0.65 02/14/2021   BUN 12 02/14/2021   NA 144 02/14/2021   K 3.8 02/14/2021   CL 101 02/14/2021   CO2 24 02/14/2021   Lab Results  Component Value Date   ALT 10 02/14/2021   AST 4 02/14/2021   GGT 75 (H) 02/08/2020   ALKPHOS 136 (H) 02/14/2021   BILITOT 0.5 02/14/2021   Lab Results  Component Value Date   HGBA1C 6.2 (H) 02/14/2021   HGBA1C 6.0 (H) 10/12/2020   HGBA1C 6.4 (H) 02/08/2020   Lab Results  Component Value Date   INSULIN 12.4 02/14/2021   INSULIN 10.9 10/12/2020   INSULIN 18.5 02/08/2020    INSULIN 15.9 08/27/2019   Lab Results  Component Value Date   TSH 0.458 02/14/2021   Lab Results  Component Value Date   CHOL 139 02/14/2021   HDL 70 02/14/2021   LDLCALC 56 02/14/2021   TRIG 66 02/14/2021   CHOLHDL 2.0 02/14/2021   Lab Results  Component Value Date   VD25OH 42.6 02/14/2021   VD25OH 38.1 10/12/2020   VD25OH 31.1 02/08/2020   Lab Results  Component Value Date   WBC 8.9 02/14/2021   HGB 11.6 02/14/2021   HCT 36.2 02/14/2021   MCV 84 02/14/2021   PLT 463 (H) 02/14/2021   Lab Results  Component Value Date   IRON 34 02/14/2021   TIBC 329 02/14/2021   FERRITIN 50 02/14/2021    Attestation Statements:   Reviewed by clinician on day of visit: allergies, medications, problem list, medical history, surgical history, family history, social history, and previous encounter notes.  I, Water quality scientist, CMA, am acting as transcriptionist for Briscoe Deutscher, DO  I have reviewed the above documentation for accuracy and completeness, and I agree with the above. Briscoe Deutscher, DO,  MS, FAAFP, DABOM - Family and Bariatric Medicine.

## 2021-07-24 NOTE — Patient Instructions (Signed)
1)This patient is at high risk of APNEA but her INSOMNIA is unlikely related- she has sleep initiation insomnia, which is a psychological or psychiatric condition. Pain usually interrupts sleep. The patient did not report RLS.    1) OSA: I can evaluate her for sleep apnea and treat to the best of her tolerance. Her BMI is morbidly elevated and alone increases her risk of OSA. Plus  medication related risks.  2) Morning headaches may be related to untreated OSA. 3) history of CPAP intolerance due to nausea and vomiting, in relation to NASH, and hiatal hernia/ GERD.   I cannot and will not maintain this patient on sleep aids to which she has become accustomed. That problem would need to be deferred to a psychologist, cognitive behavior specialist or Psychiatrist.  I am concerned that no psychiatric care provider is listed and no ICD codes of any psychiatric disease have been entered. She sees Dr Clovis Pu.

## 2021-07-24 NOTE — Progress Notes (Signed)
SLEEP MEDICINE CLINIC    Provider:  Larey Seat, MD  Primary Care Physician:  Shirline Frees, MD [place] [address]     Referring Provider: Briscoe Deutscher, Do [address] [place],  [place] [id number]          Chief Complaint according to patient   Patient presents with:     New Patient (Initial Visit)      Referred by weight and wellness, addressed as [name]      HISTORY OF PRESENT ILLNESS:  Virginia Luna is a 60 y.o. AA female patient and was seen here as a new Consult patient , upon referral on 07/24/2021 from Dr Juleen China.  Chief concern according to patient :  Internal referral for insomnia due to mental conditions. The patient is followed for non sleep issues by [facility], her primary neurologist.   The patietn weighted 289 pounds at her highest level and now is at 266 pounds. Her BMI remains at 50 .   I have the pleasure of seeing Virginia Luna [birth year]2021, a right-handed Black or [place] American female with chronic insomnia- related to  pain. Onset in her twenties,    She  has a past medical history of Allergic Rhinitis , Anemia, OSTEO_Arthritis, childhood and allergic Asthma, Chronic pain, Complication of anesthesia, Diabetes ([place]), Diabetes mellitus without complication ([place]), Edema, lower extremity, Fibroid, Fibromyalgia, GERD (gastroesophageal reflux disease), Headache, High blood pressure, History of hiatal hernia, History of stomach ulcers, IBS (irritable bowel syndrome), Joint pain, Sleep apnea, and Wears contact lenses. Fall injuries with fracture of right wrist    The patient had the first sleep study in the year 2020  with a diagnosis of OSA, [facility].  she was placed on CPAP, but d/c after developing nausea. She reported aerophagia.  Nocturia/ Enuresis 2-3 times, Sleep walking and talking, no Tonsillectomy, cervical spine DDD, no surgery.    Family medical /sleep history:  Brother on  CPAP with OSA prior to his bariatric surgery- , insomnia sleep walking in siblings. .    Social history: Patient is disabled from [employer],  and lives in a household alone. Has a constant companion.  Family status is single- never married- Tobacco use; none.  ETOH use none,  Caffeine intake in form of Coffee( /) Soda( /) Tea ( 2 week) or energy drinks. Regular exercise in form of walking with friends.       Sleep habits are as follows: The patient's dinner time is between 6-6.30PM. The patient goes to bed at 9-10  PM and she struggles to sleep without medication ( this is a chronic condition and treated by Dr Kenton Kingfisher, attributed to FIBROMYALGIA ) continues to sleep for 4-5  hours, wakes for several bathroom breaks   The preferred sleep position is supine, with the support of 2-3 pillows. Severe GERD causes arousals and sometimes she will vomit at night- constant nausea and feeling of fullness.  She wakes sometimes feeling paralyzed, scared by these spell.  Dreams are reportedly rare. Used to have nightmares.  5.30  AM is the usual rise time. The patient wakes up spontaneously 5 AM-before an alarm. She reports not feeling refreshed or restored in AM, with symptoms such as dry mouth, morning headaches, and residual fatigue.  Naps are taken infrequently after lunch, lasting from 10 to 30 minutes and are more refreshing than nocturnal sleep.    Review of Systems:  Out of a complete 14 system review, the patient complains of only the following symptoms, and all other reviewed systems are negative.:  Fatigue, sleepiness , snoring, fragmented sleep, Insomnia  Pt has fibromyalgia and has been having lots of pn at night and has been keeping her up.   Anxiety- sleeps better with someone there- safety.    How likely are you to doze in the following situations: 0 = not likely, 1 = slight chance, 2 = moderate chance, 3 = high chance   Sitting and Reading? Watching Television? Sitting inactive  in a public place (theater or meeting)? As a passenger in a car for an hour without a break? Lying down in the afternoon when circumstances permit? Sitting and talking to someone? Sitting quietly after lunch without alcohol? In a car, while stopped for a few minutes in traffic?   Total = 9 / 24 points   FSS endorsed at 32/ 63 points.   Social History   Socioeconomic History   Marital status: Single    Spouse name: Not on file   Number of children: Not on file   Years of education: Not on file   Highest education level: Some college, no degree  Occupational History   Occupation: disabled    Comment: Psychologist, occupational at daycare  Tobacco Use   Smoking status: Never   Smokeless tobacco: Never  Vaping Use   Vaping Use: Never used  Substance and Sexual Activity   Alcohol use: No   Drug use: No   Sexual activity: Not Currently  Other Topics Concern   Not on file  Social History Narrative   Lives w roommate   Right handed   Caffeine: 2 cups of tea a week. 2 sodas a week   Social Determinants of Radio broadcast assistant Strain: Not on file  Food Insecurity: Not on file  Transportation Needs: Not on file  Physical Activity: Not on file  Stress: Not on file  Social Connections: Not on file    Family History  Problem Relation Age of Onset   Breast cancer Paternal 59    Breast cancer Paternal Grandmother    Obesity Mother    Diabetes Father    High blood pressure Father    Sudden death Father     Past Medical History:  Diagnosis Date   Allergies    Anemia    Arthritis    Asthma    Back pain    Chronic pain    Complication of anesthesia    woke up during colonoscopy   Diabetes ([place])    Diabetes mellitus without complication (HCC)    Edema, lower extremity    Fibroid    Fibromyalgia    Chronic   GERD (gastroesophageal reflux disease)    Headache    migraines   High blood pressure    History of hiatal hernia    History of stomach ulcers    IBS (irritable  bowel syndrome)    Joint pain    Sleep apnea    did use cpap-lost 25lb-says she does not need it   NO CPAP   Wears contact lenses     Past Surgical History:  Procedure Laterality Date   BREAST BIOPSY     BREAST EXCISIONAL BIOPSY     BREAST LUMPECTOMY WITH RADIOACTIVE SEED LOCALIZATION Bilateral 11/24/2014   Procedure: BILATERAL BREAST LUMPECTOMY WITH RADIOACTIVE SEED LOCALIZATION;  Surgeon: [name], MD;  Location: [place];  Service: General;  Laterality:  Bilateral;   CHOLECYSTECTOMY     COLONOSCOPY     DILATION AND CURETTAGE OF UTERUS     ESOPHAGOGASTRODUODENOSCOPY (EGD) WITH PROPOFOL N/A 07/23/2016   Procedure: ESOPHAGOGASTRODUODENOSCOPY (EGD) WITH PROPOFOL;  Surgeon: Laurence Spates, MD;  Location: [facility];  Service: Endoscopy;  Laterality: N/A;   ESOPHAGOGASTRODUODENOSCOPY (EGD) WITH PROPOFOL N/A 06/11/2018   Procedure: ESOPHAGOGASTRODUODENOSCOPY (EGD) WITH PROPOFOL;  Surgeon: Laurence Spates, MD;  Location: [facility];  Service: Endoscopy;  Laterality: N/A;   LUMBAR LAMINECTOMY  2010   ORIF WRIST FRACTURE Right 11/24/2020   Procedure: OPEN REDUCTION INTERNAL FIXATION RIGHT WRIST FRACTURE;  Surgeon: Renette Butters, MD;  Location: [facility];  Service: Orthopedics;  Laterality: Right;   UMBILICAL HERNIA REPAIR     age 7   UPPER GI ENDOSCOPY       Current Outpatient Medications on File Prior to Visit  Medication Sig Dispense Refill   albuterol (PROVENTIL HFA;VENTOLIN HFA) 108 (90 BASE) MCG/ACT inhaler Inhale 2 puffs into the lungs every 6 (six) hours as needed for wheezing or shortness of breath.      amLODipine (NORVASC) 5 MG tablet Take 5 mg by mouth daily.     atorvastatin (LIPITOR) 10 MG tablet Take 10 mg by mouth at bedtime.     azelastine (ASTELIN) 0.1 % nasal spray Place 1 spray into both nostrils 2 (two) times daily as needed for allergies.  2   blood glucose meter kit and supplies Dispense based on patient and insurance preference. Use up to four  times daily as directed. (FOR ICD-10 E10.9, E11.9). 1 each 0   cyclobenzaprine (FLEXERIL) 10 MG tablet      diclofenac (VOLTAREN) 75 MG EC tablet      EPINEPHrine 0.3 mg/0.3 mL IJ SOAJ injection Inject 0.3 mg into the muscle as needed for anaphylaxis.     fluticasone (FLONASE) 50 MCG/ACT nasal spray Place 1 spray into both nostrils daily as needed for allergies.     fluticasone furoate-vilanterol (BREO ELLIPTA) 200-25 MCG/INH AEPB Inhale 1 puff daily into the lungs.     gabapentin (NEURONTIN) 300 MG capsule Take 1 capsule (300 mg total) by mouth 2 (two) times daily with breakfast and lunch. 60 capsule 0   HYDROcodone-acetaminophen (NORCO/VICODIN) 5-325 MG tablet Take 1 tablet by mouth 2 (two) times daily as needed for moderate pain.   0   Insulin Pen Needle (BD PEN NEEDLE NANO 2ND GEN) 32G X 4 MM MISC 1 Package by Does not apply route daily. 100 each 0   levocetirizine (XYZAL) 5 MG tablet Take 1 tablet (5 mg total) by mouth every evening. 90 tablet 0   linaclotide (LINZESS) 72 MCG capsule Take 1 capsule (72 mcg total) by mouth daily as needed (constipation). 90 capsule 0   meclizine (ANTIVERT) 25 MG tablet TAKE ONE TABLET BY MOUTH EVERY 8 HOURS AS NEEDED FOR DIZZINESS 30 tablet 0   methocarbamol (ROBAXIN) 500 MG tablet TAKE ONE TABLET BY MOUTH every EIGHT hours AS NEEDED FOR MUSCLE SPASMS 20 tablet 0   montelukast (SINGULAIR) 10 MG tablet Take 1 tablet (10 mg total) by mouth at bedtime. 30 tablet 3   OnabotulinumtoxinA (BOTOX IJ) Inject as directed every 3 (three) months.     ondansetron (ZOFRAN ODT) 4 MG disintegrating tablet Take 1 tablet (4 mg total) by mouth every 6 (six) hours as needed for nausea or vomiting. 20 tablet 0   Oxcarbazepine (TRILEPTAL) 300 MG tablet Take 1 tablet (300 mg total) by mouth at bedtime. X 1  week, then 600 mg nightly x1 week; can increase to 900 mg nightly- -for nerve pain 90 tablet 5   oxyCODONE (OXY IR/ROXICODONE) 5 MG immediate release tablet      pantoprazole  (PROTONIX) 40 MG tablet Take 40 mg by mouth 2 (two) times daily.  1   QUEtiapine (SEROQUEL) 300 MG tablet TAKE ONE TABLET BY MOUTH EVERYDAY AT BEDTIME 30 tablet 0   rizatriptan (MAXALT-MLT) 10 MG disintegrating tablet Take 1 tablet earliest onset of migraine.  May repeat in 2 hours if needed.  Maximum 2 tablets in 24 hours 10 tablet 5   Semaglutide,0.25 or 0.5MG /DOS, (OZEMPIC, 0.25 OR 0.5 MG/DOSE,) 2 MG/1.5ML SOPN Inject 0.5 mg into the skin once a week. 1.5 mL 0   Vitamin D, Ergocalciferol, (DRISDOL) 1.25 MG (50000 UNIT) CAPS capsule TAKE ONE CAPSULE BY MOUTH EVERY 7 DAYS 4 capsule 0   zolpidem (AMBIEN) 10 MG tablet TAKE ONE TABLET BY MOUTH EVERYDAY AT BEDTIME 30 tablet 3   No current facility-administered medications on file prior to visit.    Allergies  Allergen Reactions   Rocephin [Ceftriaxone] Anaphylaxis   Morphine And Related Itching and Nausea And Vomiting    Doesn't work   Prednisone Itching and Swelling   Sulfa Antibiotics Itching and Swelling   Amitriptyline Other (See Comments)   Penicillin G Sodium Other (See Comments)    Physical exam:  Today's Vitals   07/24/21 1003  BP: 125/83  Pulse: (!) 101  Weight: 267 lb 8 oz (121.3 kg)  Height: 5\' 1"  (1.549 m)   Body mass index is 50.54 kg/m.   Wt Readings from Last 3 Encounters:  07/24/21 267 lb 8 oz (121.3 kg)  07/20/21 266 lb (120.7 kg)  07/03/21 266 lb 12.8 oz (121 kg)     Ht Readings from Last 3 Encounters:  07/24/21 5\' 1"  (1.549 m)  07/20/21 5\' 1"  (1.549 m)  07/03/21 5\' 1"  (1.549 m)      General: The patient is awake, alert and appears not in acute distress. The patient is well groomed. Head: Normocephalic, atraumatic.  Neck is supple. Mallampati 3,  neck circumference:15 inches . Nasal airflow patent.  Retrognathia is not seen.  Dental status:  Cardiovascular:  Regular rate and cardiac rhythm by pulse,  without distended neck veins. Respiratory: Lungs are clear to auscultation.  Skin:  Without evidence  of ankle edema, or rash. Trunk: The patient's posture is erect.   Neurologic exam : The patient is awake and alert, oriented to place and time.   Memory subjective described as intact.  Attention span & concentration ability appears normal.  Speech is fluent,  without  dysarthria, dysphonia or aphasia.  Mood and affect are appropriate.   Cranial nerves: no loss of smell or taste reported  Pupils are equal and briskly reactive to light. Funduscopic exam .  Extraocular movements in vertical and horizontal planes were intact and without nystagmus. No Diplopia. Visual fields by finger perimetry are intact. Hearing was intact to soft voice and finger rubbing.    Facial sensation intact to fine touch.  Facial motor strength is symmetric and tongue and uvula move midline.  Neck ROM : rotation, tilt and flexion extension were normal for age and shoulder shrug was symmetrical.    Motor exam:  Symmetric bulk, tone and ROM.   Normal tone without cog wheeling, symmetric grip strength .   Sensory:  Fine touch, pinprick and vibration were tested  and  normal.  Proprioception tested in the  upper extremities was normal.   Coordination: Rapid alternating movements in the fingers/hands were of normal speed.  The Finger-to-nose maneuver was intact without evidence of ataxia, dysmetria or tremor.   Gait and station: Patient could rise unassisted from a seated position, walked without assistive device.  Toe and heel walk were deferred.  Deep tendon reflexes: in the  upper and lower extremities are symmetric and intact.  Babinski response was deferred.    After spending a total time of  50  minutes : including review of paper referral, of face to face and additional time for physical and neurologic examination, review of laboratory studies,  personal review of imaging studies, reports and results of other testing and review of referral information / records as far as provided in visit, I have established  the following assessments:  1)This patient is at high risk of APNEA but her INSOMNIA is unlikely related- she has sleep initiation insomnia, which is a psychological or psychiatric condition. Pain usually interrupts sleep. The patient did not report RLS.    1) OSA: I can evaluate her for sleep apnea and treat to the best of her tolerance. Her BMI is morbidly elevated and alone increases her risk of OSA. Plus  medication related risks.  2) Morning headaches may be related to untreated OSA. 3) history of CPAP intolerance due to nausea and vomiting, in relation to NASH, and hiatal hernia/ GERD.   I cannot and will not maintain this patient on sleep aids to which she has become accustomed. That problem would need to be deferred to a psychologist, cognitive behavior specialist or Psychiatrist.  I am concerned that no psychiatric care provider is listed and no ICD codes of any psychiatric disease have been entered. She sees Dr Clovis Pu.      My Plan is to proceed with:  1)addressing OSA and CSA risk factors in a patient on chronic opioid therapy.  2) In lab SPLIT study , AHI 30. Plan B: HST   I would like to thank Shirline Frees, MD and Briscoe Deutscher, Do [address] [place],  [place] [id number] for allowing me to meet with and to take care of this pleasant patient.   In short, Virginia Luna is presenting with chronic insomnia, throughout  adult life, and chronic pain- and possibly untreated OSA.- I cn only evaluate for organic sleep problems, like OSA.  I plan to follow up either personally or through our NP within 3 month.     Electronically signed by: Larey Seat, MD 07/24/2021 10:18 AM  [facility] Board certified by The [employer] of Sleep Medicine and Diplomate of the [employer] of Sleep Medicine. Board certified In Neurology through the [place], Fellow of the [employer] of Neurology. Medical Director of  [employer].

## 2021-07-26 DIAGNOSIS — J3089 Other allergic rhinitis: Secondary | ICD-10-CM | POA: Diagnosis not present

## 2021-07-26 DIAGNOSIS — J301 Allergic rhinitis due to pollen: Secondary | ICD-10-CM | POA: Diagnosis not present

## 2021-07-26 DIAGNOSIS — J3081 Allergic rhinitis due to animal (cat) (dog) hair and dander: Secondary | ICD-10-CM | POA: Diagnosis not present

## 2021-08-01 ENCOUNTER — Encounter (INDEPENDENT_AMBULATORY_CARE_PROVIDER_SITE_OTHER): Payer: Self-pay | Admitting: Family Medicine

## 2021-08-01 NOTE — Telephone Encounter (Signed)
LOV w/Dr. Juleen China

## 2021-08-01 NOTE — Telephone Encounter (Signed)
Spoke with pt and advised her that if she blacked out again to call 911 for evaluation and to also call her PCP to notify them of what been going on to see if she needs to be seen at their office.

## 2021-08-03 ENCOUNTER — Other Ambulatory Visit: Payer: Self-pay | Admitting: Neurological Surgery

## 2021-08-03 ENCOUNTER — Other Ambulatory Visit: Payer: Self-pay | Admitting: Neurology

## 2021-08-03 DIAGNOSIS — G9389 Other specified disorders of brain: Secondary | ICD-10-CM

## 2021-08-04 DIAGNOSIS — J301 Allergic rhinitis due to pollen: Secondary | ICD-10-CM | POA: Diagnosis not present

## 2021-08-04 DIAGNOSIS — J3089 Other allergic rhinitis: Secondary | ICD-10-CM | POA: Diagnosis not present

## 2021-08-04 DIAGNOSIS — J3081 Allergic rhinitis due to animal (cat) (dog) hair and dander: Secondary | ICD-10-CM | POA: Diagnosis not present

## 2021-08-07 ENCOUNTER — Encounter: Payer: Self-pay | Admitting: Psychiatry

## 2021-08-07 ENCOUNTER — Ambulatory Visit (INDEPENDENT_AMBULATORY_CARE_PROVIDER_SITE_OTHER): Payer: Medicare HMO | Admitting: Psychiatry

## 2021-08-07 DIAGNOSIS — E559 Vitamin D deficiency, unspecified: Secondary | ICD-10-CM

## 2021-08-07 DIAGNOSIS — F450 Somatization disorder: Secondary | ICD-10-CM

## 2021-08-07 DIAGNOSIS — F5105 Insomnia due to other mental disorder: Secondary | ICD-10-CM | POA: Diagnosis not present

## 2021-08-07 DIAGNOSIS — F419 Anxiety disorder, unspecified: Secondary | ICD-10-CM | POA: Diagnosis not present

## 2021-08-07 MED ORDER — ZOLPIDEM TARTRATE 10 MG PO TABS: 10.0000 mg | ORAL_TABLET | Freq: Every day | ORAL | 5 refills | Status: DC

## 2021-08-07 MED ORDER — QUETIAPINE FUMARATE 300 MG PO TABS: 300.0000 mg | ORAL_TABLET | Freq: Every day | ORAL | 11 refills | Status: DC

## 2021-08-07 NOTE — Progress Notes (Signed)
Virginia Luna [id number] 1960-10-23 60 y.o.  Virtual Visit via Telephone Note  I connected with pt by telephone and verified that I am speaking with the correct person using two identifiers.   I discussed the limitations, risks, security and privacy concerns of performing an evaluation and management service by telephone and the availability of in person appointments. I also discussed with the patient that there may be a patient responsible charge related to this service. The patient expressed understanding and agreed to proceed.  I discussed the assessment and treatment plan with the patient. The patient was provided an opportunity to ask questions and all were answered. The patient agreed with the plan and demonstrated an understanding of the instructions.   The patient was advised to call back or seek an in-person evaluation if the symptoms worsen or if the condition fails to improve as anticipated.  I provided 30 minutes of non-face-to-face time during this encounter. The call started at 925 and ended at 955. The patient was located at home and the provider was located office.   Subjective:   Patient ID:  Virginia Luna is a 60 y.o. (DOB 1960-11-13) female.  Chief Complaint:  Chief Complaint  Patient presents with   Follow-up   Anxiety   Sleeping Problem   Stress    health    HPI Virginia Luna presents for follow-up of chronic insomnia.  seen May 2020 & March 2021.  No meds were changed  07/20/20 appt with following noted: No Covid. Vaccinated.  Pain in back with injections and problems with migraine.  Maintaining OK.  Isolating and wearing mask.  Outside church when possible.  Not depressed.    Sleep affected by pain off and on. Sleep is OK unless pain interferes.  Some good sleep is better than none.  If can not stay asleep then will get up and work a puzzle and then go back to sleep.    Awakens in pain from FM and related to fall. Trying to keep up with exercises.    Pain is still high more often than it should be.  Seeing Dr. Juleen China about weight loss.  No new meds.  Just finished PT from the last sig fall.    08/07/2021 appt noted: "Brain tumor"  have blacked out and had tremors.  Referred to in My Chart as a nodule.  Has had some falls and been worked up without dx determinative.  Still seeing Dr. Juleen China regularly and that is helpful.  Has been tearful but not depressed.   Remains on quetiapine 300 mg HS and Ambien 10 mg HS.  They help sleep and needs the combination to sleep.  Patient reports stable mood and denies depressed or irritable moods.  Patient denies any recent difficulty with anxiety.  Patient denies difficulty with sleep initiation but does with maintenance. Denies appetite disturbance.  Patient reports that energy and motivation have been good.  Patient denies any difficulty with concentration.  Patient denies any suicidal ideation.   Wears mask pretty much all the time even at home.   Past Psychiatric Medication Trials: Doxepin, Rozerem,  Amitriptyline, mirtazapine, Ambien, quetiapine olanzapine Lyrica , Trileptal, duloxetine gabapentin, cyclobenzaprine  Review of Systems:  Review of Systems  HENT:  Negative for postnasal drip, rhinorrhea and sneezing.   Musculoskeletal:  Positive for back pain, myalgias and neck pain.  Neurological:  Positive for weakness. Negative for tremors.       Frequent falls  Medications: I have reviewed the patient's  current medications.  Current Outpatient Medications  Medication Sig Dispense Refill   albuterol (PROVENTIL HFA;VENTOLIN HFA) 108 (90 BASE) MCG/ACT inhaler Inhale 2 puffs into the lungs every 6 (six) hours as needed for wheezing or shortness of breath.      amLODipine (NORVASC) 5 MG tablet Take 5 mg by mouth daily.     atorvastatin (LIPITOR) 10 MG tablet Take 10 mg by mouth at bedtime.     azelastine (ASTELIN) 0.1 % nasal spray Place 1 spray into both nostrils 2 (two) times daily as needed for  allergies.  2   blood glucose meter kit and supplies Dispense based on patient and insurance preference. Use up to four times daily as directed. (FOR ICD-10 E10.9, E11.9). 1 each 0   cyclobenzaprine (FLEXERIL) 10 MG tablet      diclofenac (VOLTAREN) 75 MG EC tablet      EPINEPHrine 0.3 mg/0.3 mL IJ SOAJ injection Inject 0.3 mg into the muscle as needed for anaphylaxis.     fluticasone (FLONASE) 50 MCG/ACT nasal spray Place 1 spray into both nostrils daily as needed for allergies.     fluticasone furoate-vilanterol (BREO ELLIPTA) 200-25 MCG/INH AEPB Inhale 1 puff daily into the lungs.     gabapentin (NEURONTIN) 300 MG capsule Take 1 capsule (300 mg total) by mouth 2 (two) times daily with breakfast and lunch. 60 capsule 0   HYDROcodone-acetaminophen (NORCO/VICODIN) 5-325 MG tablet Take 1 tablet by mouth 2 (two) times daily as needed for moderate pain.   0   Insulin Pen Needle (BD PEN NEEDLE NANO 2ND GEN) 32G X 4 MM MISC 1 Package by Does not apply route daily. 100 each 0   levocetirizine (XYZAL) 5 MG tablet Take 1 tablet (5 mg total) by mouth every evening. 90 tablet 0   linaclotide (LINZESS) 72 MCG capsule Take 1 capsule (72 mcg total) by mouth daily as needed (constipation). 90 capsule 0   meclizine (ANTIVERT) 25 MG tablet TAKE ONE TABLET BY MOUTH EVERY 8 HOURS AS NEEDED FOR DIZZINESS 30 tablet 0   methocarbamol (ROBAXIN) 500 MG tablet TAKE ONE TABLET BY MOUTH every EIGHT hours AS NEEDED FOR MUSCLE SPASMS 20 tablet 0   montelukast (SINGULAIR) 10 MG tablet Take 1 tablet (10 mg total) by mouth at bedtime. 30 tablet 3   OnabotulinumtoxinA (BOTOX IJ) Inject as directed every 3 (three) months.     ondansetron (ZOFRAN ODT) 4 MG disintegrating tablet Take 1 tablet (4 mg total) by mouth every 6 (six) hours as needed for nausea or vomiting. 20 tablet 0   Oxcarbazepine (TRILEPTAL) 300 MG tablet Take 1 tablet (300 mg total) by mouth at bedtime. X 1 week, then 600 mg nightly x1 week; can increase to 900 mg  nightly- -for nerve pain 90 tablet 5   oxyCODONE (OXY IR/ROXICODONE) 5 MG immediate release tablet      pantoprazole (PROTONIX) 40 MG tablet Take 40 mg by mouth 2 (two) times daily.  1   QUEtiapine (SEROQUEL) 300 MG tablet TAKE ONE TABLET BY MOUTH EVERYDAY AT BEDTIME 30 tablet 0   rizatriptan (MAXALT-MLT) 10 MG disintegrating tablet take 1 tablet by mouth at earliest onset of migraine. may repeat in 2 hours if needed. maximum of 2 tablets in 24 hours 10 tablet 2   Semaglutide,0.25 or 0.5MG /DOS, (OZEMPIC, 0.25 OR 0.5 MG/DOSE,) 2 MG/1.5ML SOPN Inject 0.5 mg into the skin once a week. 1.5 mL 0   Vitamin D, Ergocalciferol, (DRISDOL) 1.25 MG (50000 UNIT) CAPS capsule TAKE ONE  CAPSULE BY MOUTH EVERY 7 DAYS 4 capsule 0   zolpidem (AMBIEN) 10 MG tablet TAKE ONE TABLET BY MOUTH EVERYDAY AT BEDTIME 30 tablet 3   No current facility-administered medications for this visit.    Medication Side Effects: None  Allergies:  Allergies  Allergen Reactions   Rocephin [Ceftriaxone] Anaphylaxis   Morphine And Related Itching and Nausea And Vomiting    Doesn't work   Prednisone Itching and Swelling   Sulfa Antibiotics Itching and Swelling   Amitriptyline Other (See Comments)   Penicillin G Sodium Other (See Comments)    Past Medical History:  Diagnosis Date   Allergies    Anemia    Arthritis    Asthma    Back pain    Chronic pain    Complication of anesthesia    woke up during colonoscopy   Diabetes ([place])    Diabetes mellitus without complication (HCC)    Edema, lower extremity    Fibroid    Fibromyalgia    Chronic   GERD (gastroesophageal reflux disease)    Headache    migraines   High blood pressure    History of hiatal hernia    History of stomach ulcers    IBS (irritable bowel syndrome)    Joint pain    Sleep apnea    did use cpap-lost 25lb-says she does not need it   NO CPAP   Wears contact lenses     Family History  Problem Relation Age of Onset   Breast cancer Paternal 22     Breast cancer Paternal Grandmother    Obesity Mother    Diabetes Father    High blood pressure Father    Sudden death Father     Social History   Socioeconomic History   Marital status: Single    Spouse name: Not on file   Number of children: Not on file   Years of education: Not on file   Highest education level: Some college, no degree  Occupational History   Occupation: disabled    Comment: Psychologist, occupational at daycare  Tobacco Use   Smoking status: Never   Smokeless tobacco: Never  Vaping Use   Vaping Use: Never used  Substance and Sexual Activity   Alcohol use: No   Drug use: No   Sexual activity: Not Currently  Other Topics Concern   Not on file  Social History Narrative   Lives w roommate   Right handed   Caffeine: 2 cups of tea a week. 2 sodas a week   Social Determinants of Radio broadcast assistant Strain: Not on file  Food Insecurity: Not on file  Transportation Needs: Not on file  Physical Activity: Not on file  Stress: Not on file  Social Connections: Not on file  Intimate Partner Violence: Not on file    Past Medical History, Surgical history, Social history, and Family history were reviewed and updated as appropriate.   Please see review of systems for further details on the patient's review from today.   Objective:   Physical Exam:  LMP  (LMP Unknown)   Physical Exam Neurological:     Mental Status: She is alert and oriented to person, place, and time.     Cranial Nerves: No dysarthria.  Psychiatric:        Attention and Perception: Attention normal. She does not perceive auditory hallucinations.        Mood and Affect: Mood is anxious. Mood is not depressed. Affect is not  tearful.        Speech: Speech normal.        Behavior: Behavior is cooperative.        Thought Content: Thought content normal. Thought content is not paranoid or delusional. Thought content does not include homicidal or suicidal ideation. Thought content does not include  homicidal or suicidal plan.        Cognition and Memory: Cognition and memory normal.        Judgment: Judgment normal.     Comments: Chronic somatic focus ongoing. Insight fair. Talkative per usual.    Lab Review:     Component Value Date/Time   NA 144 02/14/2021 1508   K 3.8 02/14/2021 1508   CL 101 02/14/2021 1508   CO2 24 02/14/2021 1508   GLUCOSE 92 02/14/2021 1508   GLUCOSE 107 (H) 07/02/2016 2205   BUN 12 02/14/2021 1508   CREATININE 0.65 02/14/2021 1508   CALCIUM 9.5 02/14/2021 1508   PROT 7.2 02/14/2021 1508   ALBUMIN 4.9 02/14/2021 1508   AST 4 02/14/2021 1508   ALT 10 02/14/2021 1508   ALKPHOS 136 (H) 02/14/2021 1508   BILITOT 0.5 02/14/2021 1508   GFRNONAA 100 10/12/2020 1624   GFRAA 116 10/12/2020 1624       Component Value Date/Time   WBC 8.9 02/14/2021 1508   WBC 8.9 11/24/2020 0600   RBC 4.30 02/14/2021 1508   RBC 4.27 11/24/2020 0600   HGB 11.6 02/14/2021 1508   HCT 36.2 02/14/2021 1508   PLT 463 (H) 02/14/2021 1508   MCV 84 02/14/2021 1508   MCH 27.0 02/14/2021 1508   MCH 26.7 11/24/2020 0600   MCHC 32.0 02/14/2021 1508   MCHC 29.5 (L) 11/24/2020 0600   RDW 15.7 (H) 02/14/2021 1508   LYMPHSABS 1.7 02/14/2021 1508   MONOABS 0.3 10/13/2008 1230   EOSABS 0.1 02/14/2021 1508   BASOSABS 0.1 02/14/2021 1508    No results found for: POCLITH, LITHIUM   No results found for: PHENYTOIN, PHENOBARB, VALPROATE, CBMZ   .res Assessment: Plan:    Anxiety with somatization  Insomnia due to mental condition  Vitamin D deficiency   We discussed the short-term risks associated with benzodiazepines including sedation and increased fall risk among others.  Discussed long-term side effect risk including dependence, potential withdrawal symptoms, and the potential eventual dose-related risk of dementia. Discussed the possibility of splitting the zolpidem between sleep onset and then with the early morning awakening.  She says she is tried that and does  better about taking it all at once first thing going to bed.  We discussed the risk of amnesia.  She is not had any problems with that.  Discussed potential metabolic side effects associated with atypical antipsychotics, as well as potential risk for movement side effects. Advised pt to contact office if movement side effects occur.  She has required quetiapine for chronic severe insomnia that is related to both anxiety and to fibromyalgia.  She gets clear benefit from quetiapine.  She is at the lowest effective dose.  We have tried to reduce dosages.  She is tolerating it well.  Continue quetiapine 300 mg HS and Ambien 10 mg HS.  Cannot sleep without these meds.  Quetiapine likely having positive mood and anxiety affect also.  Med dosing has been stable for follow-up of several years Improved sleep and moods with the meds. Falling does not follow a pattern consistent with med side effects but have discussed this risk.  Disc low vitamin  D dx and importance of it for Covid protection and mental health reasons.    Follow-up 12 months because of stability.  She has been on the same meds at the same dosage for several years.  Lynder Parents, MD, DFAPA   Please see After Visit Summary for patient specific instructions.  Future Appointments  Date Time Provider [place]  08/09/2021  9:00 AM Briscoe Deutscher, DO [facility] None  08/25/2021  1:10 PM Metta Clines R, DO [facility] None  09/05/2021 11:40 AM Briscoe Deutscher, DO [facility] None  09/25/2021 11:00 AM Courtney Heys, MD [facility] CPR  10/04/2021  9:20 AM Courtney Heys, MD [facility] CPR  11/20/2021  9:20 AM [facility] MM 2 [facility]  01/18/2022 10:30 AM Pieter Partridge, DO [facility] None    No orders of the defined types were placed in this encounter.     -------------------------------

## 2021-08-08 ENCOUNTER — Other Ambulatory Visit (INDEPENDENT_AMBULATORY_CARE_PROVIDER_SITE_OTHER): Payer: Self-pay

## 2021-08-08 DIAGNOSIS — R0609 Other forms of dyspnea: Secondary | ICD-10-CM

## 2021-08-09 ENCOUNTER — Ambulatory Visit (INDEPENDENT_AMBULATORY_CARE_PROVIDER_SITE_OTHER): Payer: Medicare HMO | Admitting: Family Medicine

## 2021-08-09 ENCOUNTER — Other Ambulatory Visit (INDEPENDENT_AMBULATORY_CARE_PROVIDER_SITE_OTHER): Payer: Self-pay | Admitting: Family Medicine

## 2021-08-09 ENCOUNTER — Encounter (INDEPENDENT_AMBULATORY_CARE_PROVIDER_SITE_OTHER): Payer: Self-pay | Admitting: Family Medicine

## 2021-08-09 ENCOUNTER — Other Ambulatory Visit: Payer: Self-pay

## 2021-08-09 VITALS — BP 135/84 | HR 111 | Temp 97.9°F | Ht 61.0 in | Wt 267.0 lb

## 2021-08-09 DIAGNOSIS — F419 Anxiety disorder, unspecified: Secondary | ICD-10-CM

## 2021-08-09 DIAGNOSIS — R296 Repeated falls: Secondary | ICD-10-CM

## 2021-08-09 DIAGNOSIS — E1169 Type 2 diabetes mellitus with other specified complication: Secondary | ICD-10-CM | POA: Diagnosis not present

## 2021-08-09 DIAGNOSIS — F450 Somatization disorder: Secondary | ICD-10-CM | POA: Diagnosis not present

## 2021-08-09 DIAGNOSIS — J3081 Allergic rhinitis due to animal (cat) (dog) hair and dander: Secondary | ICD-10-CM | POA: Diagnosis not present

## 2021-08-09 DIAGNOSIS — Z79899 Other long term (current) drug therapy: Secondary | ICD-10-CM

## 2021-08-09 DIAGNOSIS — E559 Vitamin D deficiency, unspecified: Secondary | ICD-10-CM

## 2021-08-09 DIAGNOSIS — G894 Chronic pain syndrome: Secondary | ICD-10-CM

## 2021-08-09 DIAGNOSIS — G4733 Obstructive sleep apnea (adult) (pediatric): Secondary | ICD-10-CM | POA: Diagnosis not present

## 2021-08-09 DIAGNOSIS — J301 Allergic rhinitis due to pollen: Secondary | ICD-10-CM | POA: Diagnosis not present

## 2021-08-09 DIAGNOSIS — J3089 Other allergic rhinitis: Secondary | ICD-10-CM | POA: Diagnosis not present

## 2021-08-09 DIAGNOSIS — Z6841 Body Mass Index (BMI) 40.0 and over, adult: Secondary | ICD-10-CM

## 2021-08-09 DIAGNOSIS — G4701 Insomnia due to medical condition: Secondary | ICD-10-CM

## 2021-08-10 DIAGNOSIS — J4520 Mild intermittent asthma, uncomplicated: Secondary | ICD-10-CM | POA: Diagnosis not present

## 2021-08-10 DIAGNOSIS — K219 Gastro-esophageal reflux disease without esophagitis: Secondary | ICD-10-CM | POA: Diagnosis not present

## 2021-08-10 DIAGNOSIS — N3946 Mixed incontinence: Secondary | ICD-10-CM | POA: Diagnosis not present

## 2021-08-10 DIAGNOSIS — I10 Essential (primary) hypertension: Secondary | ICD-10-CM | POA: Diagnosis not present

## 2021-08-10 DIAGNOSIS — E7800 Pure hypercholesterolemia, unspecified: Secondary | ICD-10-CM | POA: Diagnosis not present

## 2021-08-10 DIAGNOSIS — R351 Nocturia: Secondary | ICD-10-CM | POA: Diagnosis not present

## 2021-08-10 DIAGNOSIS — D649 Anemia, unspecified: Secondary | ICD-10-CM | POA: Diagnosis not present

## 2021-08-10 DIAGNOSIS — E119 Type 2 diabetes mellitus without complications: Secondary | ICD-10-CM | POA: Diagnosis not present

## 2021-08-10 NOTE — Telephone Encounter (Signed)
Dr.Wallace °

## 2021-08-10 NOTE — Progress Notes (Signed)
Chief Complaint:   OBESITY Virginia Luna is here to discuss her progress with her obesity treatment plan along with follow-up of her obesity related diagnoses. See Medical Weight Management Flowsheet for complete bioelectrical impedance results.  Today's visit was #: 70 Starting weight: 288 lbs Starting date: 08/27/2019 Weight change since last visit: +1 lb Total lbs lost to date: 21 lbs Total weight loss percentage to date: -7.29%  Nutrition Plan: Practicing portion control and making smarter food choices, such as increasing vegetables and decreasing simple carbohydrates for 0% of the time. Activity: Cardio for 15+ minutes 3-4 times per week. Anti-obesity medications: Ozempic 0.5 mg subcutaneously weekly. Reported side effects: None.  Interim History: Virginia Luna has been to see Dr. Clovis Pu.  She says she has been having hand tremors every morning that last about 3 hours.  She has also been having blackout spells.  No triggers that she is aware of.  She endorses dizziness. Recent fall. She does not remember the fall.  She says that with carbamazepine, her pain is "different", and is hopeful that it is worth pursuing.  She has RLS, which will need to be corrected in her chart (reviewed recent Sleep note).  She will see Urology tomorrow.  Assessment/Plan:   1. Frequent falls Medications:  She is on several high risk medications. Mass:  Brain MRI has been ordered.   2D echo ordered.  Consider event monitor.  2. Type 2 diabetes mellitus with other specified complication, without long-term current use of insulin (HCC) BS:  Eating regularly.   Consider Dexcom.  Consider sample to monitor for 10 days. Next visit with her PCP, Dr. Kenton Kingfisher, is in March.  3. Insomnia due to medical condition Medications per Dr. Clovis Pu.  4. OSA (obstructive sleep apnea) Cannot tolerate CPAP.  New sleep study recommended by specialist.  5. Chronic pain syndrome Severe pain. Will monitor.  6. Polypharmacy Other  substances:  No CBD/Delta 8 - tried and it helped but she did not want to take if it threatens medication management.  Will stop magnesium due to diarrhea.  7. Anxiety with somatization Diagnosed by Psychiatry.  Recently saw Dr. Clovis Pu. We discussed this diagnosis, but will continue to look for organic causes.  8. Obesity, current BMI 50.6  Course: Virginia Luna is currently in the action stage of change. As such, her goal is to continue with weight loss efforts.   Nutrition goals: She has agreed to practicing portion control and making smarter food choices, such as increasing vegetables and decreasing simple carbohydrates.   Exercise goals:  As is.  Behavioral modification strategies: increasing lean protein intake, decreasing simple carbohydrates, increasing vegetables, and increasing water intake.  Virginia Luna has agreed to follow-up with our clinic in 2 weeks. She was informed of the importance of frequent follow-up visits to maximize her success with intensive lifestyle modifications for her multiple health conditions.   Objective:   Blood pressure 135/84, pulse (!) 111, temperature 97.9 F (36.6 C), temperature source Oral, height 5\' 1"  (1.549 m), weight 267 lb (121.1 kg), SpO2 97 %. Body mass index is 50.45 kg/m.  General: Cooperative, alert, well developed, in no acute distress. HEENT: Conjunctivae and lids unremarkable. Cardiovascular: Regular rhythm.  Lungs: Normal work of breathing. Neurologic: No focal deficits.   Lab Results  Component Value Date   CREATININE 0.65 02/14/2021   BUN 12 02/14/2021   NA 144 02/14/2021   K 3.8 02/14/2021   CL 101 02/14/2021   CO2 24 02/14/2021   Lab Results  Component Value Date   ALT 10 02/14/2021   AST 4 02/14/2021   GGT 75 (H) 02/08/2020   ALKPHOS 136 (H) 02/14/2021   BILITOT 0.5 02/14/2021   Lab Results  Component Value Date   HGBA1C 6.2 (H) 02/14/2021   HGBA1C 6.0 (H) 10/12/2020   HGBA1C 6.4 (H) 02/08/2020   Lab Results   Component Value Date   INSULIN 12.4 02/14/2021   INSULIN 10.9 10/12/2020   INSULIN 18.5 02/08/2020   INSULIN 15.9 08/27/2019   Lab Results  Component Value Date   TSH 0.458 02/14/2021   Lab Results  Component Value Date   CHOL 139 02/14/2021   HDL 70 02/14/2021   LDLCALC 56 02/14/2021   TRIG 66 02/14/2021   CHOLHDL 2.0 02/14/2021   Lab Results  Component Value Date   VD25OH 42.6 02/14/2021   VD25OH 38.1 10/12/2020   VD25OH 31.1 02/08/2020   Lab Results  Component Value Date   WBC 8.9 02/14/2021   HGB 11.6 02/14/2021   HCT 36.2 02/14/2021   MCV 84 02/14/2021   PLT 463 (H) 02/14/2021   Lab Results  Component Value Date   IRON 34 02/14/2021   TIBC 329 02/14/2021   FERRITIN 50 02/14/2021   Attestation Statements:   Reviewed by clinician on day of visit: allergies, medications, problem list, medical history, surgical history, family history, social history, and previous encounter notes.  Time spent on visit including pre-visit chart review and post-visit care and documentation was 42 minutes. Time was spent on: Food choices and timing of food intake reviewed today. I discussed a personalized meal plan with the patient that will help her to lose weight and will improve her obesity-related conditions going forward. I performed a medically necessary appropriate examination and/or evaluation. I discussed the assessment and treatment plan with the patient. Motivational interviewing as well as evidence-based interventions for health behavior change were utilized today including the discussion of self monitoring techniques, problem-solving barriers and SMART goal setting techniques.  An exercise prescription was reviewed.  The patient was provided an opportunity to ask questions and all were answered. The patient agreed with the plan and demonstrated an understanding of the instructions. Clinical information was updated and documented in the EMR.  I, Water quality scientist, CMA, am acting as  transcriptionist for Briscoe Deutscher, DO  I have reviewed the above documentation for accuracy and completeness, and I agree with the above. -  Briscoe Deutscher, DO, MS, FAAFP, DABOM - Family and Bariatric Medicine.

## 2021-08-11 ENCOUNTER — Encounter (HOSPITAL_COMMUNITY): Payer: Self-pay | Admitting: Family Medicine

## 2021-08-16 DIAGNOSIS — J301 Allergic rhinitis due to pollen: Secondary | ICD-10-CM | POA: Diagnosis not present

## 2021-08-16 DIAGNOSIS — J3081 Allergic rhinitis due to animal (cat) (dog) hair and dander: Secondary | ICD-10-CM | POA: Diagnosis not present

## 2021-08-16 DIAGNOSIS — N3946 Mixed incontinence: Secondary | ICD-10-CM | POA: Diagnosis not present

## 2021-08-16 DIAGNOSIS — J3089 Other allergic rhinitis: Secondary | ICD-10-CM | POA: Diagnosis not present

## 2021-08-18 ENCOUNTER — Ambulatory Visit (INDEPENDENT_AMBULATORY_CARE_PROVIDER_SITE_OTHER): Payer: Medicare HMO | Admitting: Neurology

## 2021-08-18 ENCOUNTER — Other Ambulatory Visit: Payer: Self-pay

## 2021-08-18 DIAGNOSIS — G4736 Sleep related hypoventilation in conditions classified elsewhere: Secondary | ICD-10-CM

## 2021-08-18 DIAGNOSIS — G4733 Obstructive sleep apnea (adult) (pediatric): Secondary | ICD-10-CM

## 2021-08-18 DIAGNOSIS — Z0100 Encounter for examination of eyes and vision without abnormal findings: Secondary | ICD-10-CM | POA: Diagnosis not present

## 2021-08-18 DIAGNOSIS — E1169 Type 2 diabetes mellitus with other specified complication: Secondary | ICD-10-CM

## 2021-08-18 DIAGNOSIS — M7918 Myalgia, other site: Secondary | ICD-10-CM

## 2021-08-18 DIAGNOSIS — G4701 Insomnia due to medical condition: Secondary | ICD-10-CM

## 2021-08-18 DIAGNOSIS — Z6841 Body Mass Index (BMI) 40.0 and over, adult: Secondary | ICD-10-CM

## 2021-08-18 DIAGNOSIS — Z789 Other specified health status: Secondary | ICD-10-CM

## 2021-08-18 DIAGNOSIS — E785 Hyperlipidemia, unspecified: Secondary | ICD-10-CM

## 2021-08-18 DIAGNOSIS — I152 Hypertension secondary to endocrine disorders: Secondary | ICD-10-CM

## 2021-08-18 DIAGNOSIS — K760 Fatty (change of) liver, not elsewhere classified: Secondary | ICD-10-CM

## 2021-08-18 DIAGNOSIS — E1159 Type 2 diabetes mellitus with other circulatory complications: Secondary | ICD-10-CM

## 2021-08-22 ENCOUNTER — Ambulatory Visit (HOSPITAL_COMMUNITY): Payer: Medicare HMO | Attending: Family Medicine

## 2021-08-22 ENCOUNTER — Other Ambulatory Visit: Payer: Self-pay

## 2021-08-22 DIAGNOSIS — J3081 Allergic rhinitis due to animal (cat) (dog) hair and dander: Secondary | ICD-10-CM | POA: Diagnosis not present

## 2021-08-22 DIAGNOSIS — J3089 Other allergic rhinitis: Secondary | ICD-10-CM | POA: Diagnosis not present

## 2021-08-22 DIAGNOSIS — J301 Allergic rhinitis due to pollen: Secondary | ICD-10-CM | POA: Diagnosis not present

## 2021-08-22 DIAGNOSIS — R0609 Other forms of dyspnea: Secondary | ICD-10-CM | POA: Diagnosis not present

## 2021-08-22 LAB — ECHOCARDIOGRAM COMPLETE
Area-P 1/2: 3.21 cm2
S' Lateral: 2.6 cm

## 2021-08-24 ENCOUNTER — Other Ambulatory Visit: Payer: Self-pay

## 2021-08-24 ENCOUNTER — Encounter (INDEPENDENT_AMBULATORY_CARE_PROVIDER_SITE_OTHER): Payer: Self-pay | Admitting: Family Medicine

## 2021-08-24 ENCOUNTER — Encounter (INDEPENDENT_AMBULATORY_CARE_PROVIDER_SITE_OTHER): Payer: Self-pay

## 2021-08-24 ENCOUNTER — Ambulatory Visit (INDEPENDENT_AMBULATORY_CARE_PROVIDER_SITE_OTHER): Payer: Medicare HMO | Admitting: Family Medicine

## 2021-08-24 VITALS — BP 137/87 | HR 93 | Temp 97.8°F | Ht 61.0 in | Wt 270.0 lb

## 2021-08-24 DIAGNOSIS — I5189 Other ill-defined heart diseases: Secondary | ICD-10-CM | POA: Diagnosis not present

## 2021-08-24 DIAGNOSIS — R296 Repeated falls: Secondary | ICD-10-CM

## 2021-08-24 DIAGNOSIS — E1159 Type 2 diabetes mellitus with other circulatory complications: Secondary | ICD-10-CM

## 2021-08-24 DIAGNOSIS — E1169 Type 2 diabetes mellitus with other specified complication: Secondary | ICD-10-CM

## 2021-08-24 DIAGNOSIS — E669 Obesity, unspecified: Secondary | ICD-10-CM

## 2021-08-24 DIAGNOSIS — Z6841 Body Mass Index (BMI) 40.0 and over, adult: Secondary | ICD-10-CM

## 2021-08-25 ENCOUNTER — Ambulatory Visit: Payer: Medicare HMO | Admitting: Neurology

## 2021-08-28 NOTE — Progress Notes (Signed)
Chief Complaint:   OBESITY Virginia Luna is here to discuss her progress with her obesity treatment plan along with follow-up of her obesity related diagnoses. See Medical Weight Management Flowsheet for complete bioelectrical impedance results.  Today's visit was #: 24 Starting weight: 288 lbs Starting date: 08/27/2019 Weight change since last visit: +3 lbs Total lbs lost to date: 18 lbs Total weight loss percentage to date: -6.25%  Nutrition Plan: Practicing portion control and making smarter food choices, such as increasing vegetables and decreasing simple carbohydrates for 0% of the time.  Activity: None. Anti-obesity medications: Ozempic 0.5 mg subcutaneously weekly. Reported side effects: None.  Interim History: Virginia Luna had a fall at home on Sunday night.  She has a new MRI coming up.  Assessment/Plan:   1. Frequent falls Virginia Luna had a fall Sunday night.  She has an MRI coming up. The patient understands monitoring parameters and red flags.   2. Type 2 diabetes mellitus with other specified complication, without long-term current use of insulin (HCC) Diabetes Mellitus: At goal. Medication: Ozempic 0.5 mg subcutaneously weekly. Issues reviewed: blood sugar goals, complications of diabetes mellitus, hypoglycemia prevention and treatment, exercise, and nutrition.  Plan: The patient will continue to focus on protein-rich, low simple carbohydrate foods. We reviewed the importance of hydration, regular exercise for stress reduction, and restorative sleep.   Lab Results  Component Value Date   HGBA1C 6.2 (H) 02/14/2021   HGBA1C 6.0 (H) 10/12/2020   HGBA1C 6.4 (H) 02/08/2020   Lab Results  Component Value Date   LDLCALC 56 02/14/2021   CREATININE 0.65 02/14/2021   3. Diastolic dysfunction Followed by Cardiology. We will continue to monitor symptoms as they relate to her weight loss journey.ECHO discussed today. This was reassuring re: evaluation for her frequent falls.  4.  Obesity, current BMI 51  Course: Virginia Luna is currently in the action stage of change. As such, her goal is to continue with weight loss efforts.   Nutrition goals: She has agreed to practicing portion control and making smarter food choices, such as increasing vegetables and decreasing simple carbohydrates.   Exercise goals: No exercise has been prescribed at this time.  Behavioral modification strategies: increasing lean protein intake, decreasing simple carbohydrates, increasing vegetables, increasing water intake, and decreasing liquid calories.  Virginia Luna has agreed to follow-up with our clinic in 2 weeks. She was informed of the importance of frequent follow-up visits to maximize her success with intensive lifestyle modifications for her multiple health conditions.   Objective:   Blood pressure 137/87, pulse 93, temperature 97.8 F (36.6 C), temperature source Oral, height 5\' 1"  (1.549 m), weight 270 lb (122.5 kg), SpO2 97 %. Body mass index is 51.02 kg/m.  General: Cooperative, alert, well developed, in no acute distress. HEENT: Conjunctivae and lids unremarkable. Cardiovascular: Regular rhythm.  Lungs: Normal work of breathing. Neurologic: No focal deficits.   Lab Results  Component Value Date   CREATININE 0.65 02/14/2021   BUN 12 02/14/2021   NA 144 02/14/2021   K 3.8 02/14/2021   CL 101 02/14/2021   CO2 24 02/14/2021   Lab Results  Component Value Date   ALT 10 02/14/2021   AST 4 02/14/2021   GGT 75 (H) 02/08/2020   ALKPHOS 136 (H) 02/14/2021   BILITOT 0.5 02/14/2021   Lab Results  Component Value Date   HGBA1C 6.2 (H) 02/14/2021   HGBA1C 6.0 (H) 10/12/2020   HGBA1C 6.4 (H) 02/08/2020   Lab Results  Component Value Date  INSULIN 12.4 02/14/2021   INSULIN 10.9 10/12/2020   INSULIN 18.5 02/08/2020   INSULIN 15.9 08/27/2019   Lab Results  Component Value Date   TSH 0.458 02/14/2021   Lab Results  Component Value Date   CHOL 139 02/14/2021   HDL 70  02/14/2021   LDLCALC 56 02/14/2021   TRIG 66 02/14/2021   CHOLHDL 2.0 02/14/2021   Lab Results  Component Value Date   VD25OH 42.6 02/14/2021   VD25OH 38.1 10/12/2020   VD25OH 31.1 02/08/2020   Lab Results  Component Value Date   WBC 8.9 02/14/2021   HGB 11.6 02/14/2021   HCT 36.2 02/14/2021   MCV 84 02/14/2021   PLT 463 (H) 02/14/2021   Lab Results  Component Value Date   IRON 34 02/14/2021   TIBC 329 02/14/2021   FERRITIN 50 02/14/2021   Attestation Statements:   Reviewed by clinician on day of visit: allergies, medications, problem list, medical history, surgical history, family history, social history, and previous encounter notes.  I, Water quality scientist, CMA, am acting as transcriptionist for Briscoe Deutscher, DO  I have reviewed the above documentation for accuracy and completeness, and I agree with the above. -  Briscoe Deutscher, DO, MS, FAAFP, DABOM - Family and Bariatric Medicine.

## 2021-08-30 DIAGNOSIS — J3081 Allergic rhinitis due to animal (cat) (dog) hair and dander: Secondary | ICD-10-CM | POA: Diagnosis not present

## 2021-08-30 DIAGNOSIS — J3089 Other allergic rhinitis: Secondary | ICD-10-CM | POA: Diagnosis not present

## 2021-08-30 DIAGNOSIS — J301 Allergic rhinitis due to pollen: Secondary | ICD-10-CM | POA: Diagnosis not present

## 2021-09-02 ENCOUNTER — Ambulatory Visit
Admission: RE | Admit: 2021-09-02 | Discharge: 2021-09-02 | Disposition: A | Discharge status: Home / Self Care | Payer: Medicare HMO | Source: Ambulatory Visit | Attending: Neurological Surgery | Admitting: Neurological Surgery

## 2021-09-02 ENCOUNTER — Other Ambulatory Visit: Payer: Self-pay

## 2021-09-02 DIAGNOSIS — I619 Nontraumatic intracerebral hemorrhage, unspecified: Secondary | ICD-10-CM | POA: Diagnosis not present

## 2021-09-02 DIAGNOSIS — G936 Cerebral edema: Secondary | ICD-10-CM | POA: Diagnosis not present

## 2021-09-02 DIAGNOSIS — G9389 Other specified disorders of brain: Secondary | ICD-10-CM

## 2021-09-02 DIAGNOSIS — R220 Localized swelling, mass and lump, head: Secondary | ICD-10-CM | POA: Diagnosis not present

## 2021-09-02 MED ORDER — GADOBENATE DIMEGLUMINE 529 MG/ML IV SOLN
20.0000 mL | Freq: Once | INTRAVENOUS | Status: AC | PRN
  Administered 2021-09-02: 20 mL via INTRAVENOUS

## 2021-09-03 DIAGNOSIS — Z789 Other specified health status: Secondary | ICD-10-CM | POA: Insufficient documentation

## 2021-09-03 NOTE — Addendum Note (Signed)
Addended by: Larey Seat on: 09/03/2021 08:15 PM   Modules accepted: Orders

## 2021-09-03 NOTE — Procedures (Signed)
PATIENT'S NAME:  Virginia, Luna DOB:      May 25, 1961      MR#:    [id number]     DATE OF RECORDING: 08/18/2021 REFERRING M.D.:  Briscoe Deutscher, DO and Metta Clines , DO Study Performed:   Baseline Polysomnogram HISTORY:  Virginia Luna was a new patient seen in Consultation on 07-24-2021, a right-handed [place] American female with chronic insomnia- related to pain.  The patient is followed for fibromyalgia and pain by her primary Neurologist Dr Tomi Likens, DO.  Onset in her twenties, she has a past medical history of Allergic Rhinitis, Anemia, OSTEO Arthritis, childhood and allergic Asthma, Chronic pain, Diabetes mellitus without complication ([place]), Edema, lower extremity, Fibroid, Fibromyalgia, GERD (gastroesophageal reflux disease), unspecified Headache, , History of hiatal hernia, History of stomach ulcers, IBS (irritable bowel syndrome), Sleep apnea, and Wears contact lenses. Fall injuries with fracture of right wrist.   The patient had the first sleep study in the year 2020, with a diagnosis of OSA, at [facility] after which she was placed on CPAP, but d/c after developing nausea. She reported aerophagia. D/C CPAP use.  Nocturia/ Enuresis 2-3 times, Sleep walking and talking, no Tonsillectomy, cervical spine DDD, no surgery.   Family medical /sleep history: Brother on CPAP with OSA prior to his bariatric surgery- insomnia and sleep walking in siblings.  The patient endorsed the Epworth Sleepiness Scale at 9 points.   The patient's weight 267 pounds with a height of 61 (inches), resulting in a BMI of 50.4 kg/m2. The patient's neck circumference measured 15 inches.  CURRENT MEDICATIONS: Proventil, Norvasc, Lipitor, Astelin, Flexeril, Voltaren, Epinephrine, Flonase,  Breo Ellipta, Neurontin, Hydrocodone, Xyzal, Linzess, Antivert, Robaxin, Singulair, Botox, Zofran,  Trileptal, Oxycodone, Protonix, Seroquel, Maxalt, Ozempic, Vit D   PROCEDURE:  This is a multichannel digital  polysomnogram utilizing the Somnostar 11.2 system.  Electrodes and sensors were applied and monitored per AASM Specifications.   EEG, EOG, Chin and Limb EMG, were sampled at 200 Hz.  ECG, Snore and Nasal Pressure, Thermal Airflow, Respiratory Effort, CPAP Flow and Pressure, Oximetry was sampled at 50 Hz. Digital video and audio were recorded.      BASELINE STUDY: Lights Out was at 22:12 and Lights On at 05:00.  Total recording time (TRT) was 408.5 minutes, with a total sleep time (TST) of 376 minutes.   The patient's sleep latency was 4 minutes.  REM latency was prolonged at  172 minutes.  The sleep efficiency was 92.0 %.     SLEEP ARCHITECTURE: WASO (Wake after sleep onset) was 28.5 minutes.  There were 10 minutes in Stage N1, 283.5 minutes Stage N2, 45 minutes Stage N3 and 37.5 minutes in Stage REM.  The percentage of Stage N1 was 2.7%, Stage N2 was 75.4%, Stage N3 was 12.% and Stage R (REM sleep) was 10.0%.    RESPIRATORY ANALYSIS:  There were a total of 68 respiratory events:  42 obstructive apneas, 0 central apneas and 1 mixed apnea with a total of 43 apneas and an apnea index (AI) of 6.9 /hour. There were 25 hypopneas with a hypopnea index of 4.0 /hour.      The total APNEA/HYPOPNEA INDEX (AHI) was 10.9/hour.  32 events occurred in REM sleep and 27 events in NREM. The REM AHI was  51.2 /hour, versus a non-REM AHI of 6.4. The patient spent 237 minutes of total sleep time in the supine position and 139 minutes in non-supine. The supine AHI was 8.3 versus a non-supine AHI  of 15.1.  OXYGEN SATURATION & C02:  The Wake baseline 02 saturation was 92%, with the lowest being 75%. Time spent below 89% saturation equaled 76 minutes. The technologist added finally 1 liter oxygen at 3.30 AM to improve the saturation. This would have been 5 hours after medication intake.   The patient had a total of 0 Periodic Limb Movements.   The arousals were noted as: 21 were spontaneous, 0 were associated with PLMs, 5  were associated with respiratory events.  Audio and video analysis did not show any abnormal or unusual movements, behaviors, phonations or vocalizations.   Mild Snoring was noted. EKG was in keeping with regular sinus rhythm (NSR) intermittent sinus brady-tachycardia.  IMPRESSION:  Overall Mild Obstructive Sleep Apnea (OSA) with very Strong REM dependence. Sleep apnea was associated with severe sleep hypoxia. This required oxygen a supplementation at 3.30 AM at  1L. progressive hypoxia can be opiate induced.   No evidence of Periodic Limb Movement Disorder (PLMD) Primary Snoring in supine sleep position.  Heart rate variability corresponding to hypoxia reflected in EKG. Sleep architecture as documented showed moderate sleep fragmentation, high sleep efficiency.   RECOMMENDATIONS:  Advise full night, attended, CPAP titration study to optimize therapy. This patient would require oxygen as well should CPAP, BiPAP not increase the 02 saturation.    I certify that I have reviewed the entire raw data recording prior to the issuance of this report in accordance with the Standards of Accreditation of the [place] [employer] (AASM)  Larey Seat, MD Medical Director, [facility] [place] [facility] and Sleep Medicine (Neurology and Sleep Medicine)

## 2021-09-04 ENCOUNTER — Telehealth: Payer: Self-pay

## 2021-09-04 ENCOUNTER — Ambulatory Visit: Payer: Medicare HMO | Admitting: Neurology

## 2021-09-04 DIAGNOSIS — N3946 Mixed incontinence: Secondary | ICD-10-CM | POA: Diagnosis not present

## 2021-09-04 NOTE — Telephone Encounter (Signed)
I called pt. I advised pt that Dr. Brett Fairy reviewed their sleep study results and found that patient has sleep apnea with hypoxia and recommends that pt be treated with a cpap. Dr. Brett Fairy recommends that pt return for a repeat sleep study in order to properly titrate the cpap and ensure a good mask fit. Pt is agreeable to returning for a titration study. I advised pt that our sleep lab will file with pt's insurance and call pt to schedule the sleep study when we hear back from the pt's insurance regarding coverage of this sleep study. Pt verbalized understanding of results. Pt had no questions at this time but was encouraged to call back if questions arise.

## 2021-09-04 NOTE — Telephone Encounter (Signed)
-----   Message from Larey Seat, MD sent at 09/03/2021  8:15 PM EST ----- The total APNEA/HYPOPNEA INDEX (AHI) was 10.9/hour.  32 events occurred in REM sleep and 27 events in NREM. The REM AHI was  51.2 /hour, versus a non-REM AHI of 6.4. The patient spent 237 minutes of total sleep time in the supine position and 139 minutes in non-supine. The supine AHI was 8.3 versus a non-supine AHI of 15.1.  OXYGEN SATURATION & C02:  The Wake baseline 02 saturation was 92%, with the lowest being 75%. Time spent below 89% saturation equaled 76 minutes. The technologist added finally 1 liter oxygen at 3.30 AM to improve the saturation. This would have been 5 hours after medication intake.  IMPRESSION:  1. Overall Mild Obstructive Sleep Apnea (OSA) with very Strong REM dependence. 2. Sleep apnea was associated with severe sleep hypoxia. This required oxygen a supplementation at 3.30 AM at  1L. progressive hypoxia can be opiate induced.   3. No evidence of Periodic Limb Movement Disorder (PLMD) 4. Primary Snoring in supine sleep position.  5. Heart rate variability corresponding to hypoxia reflected in EKG. 6. Sleep architecture as documented showed moderate sleep fragmentation, high sleep efficiency.   RECOMMENDATIONS:  1. Advise full night, attended, PAP titration study to optimize therapy.   This patient would require oxygen as well should CPAP, BiPAP not increase the 02 saturation.    Larey Seat, MD Medical Director, [facility]

## 2021-09-05 ENCOUNTER — Other Ambulatory Visit: Payer: Self-pay

## 2021-09-05 ENCOUNTER — Ambulatory Visit (INDEPENDENT_AMBULATORY_CARE_PROVIDER_SITE_OTHER): Payer: Medicare HMO | Admitting: Family Medicine

## 2021-09-05 ENCOUNTER — Encounter (INDEPENDENT_AMBULATORY_CARE_PROVIDER_SITE_OTHER): Payer: Self-pay | Admitting: Family Medicine

## 2021-09-05 VITALS — BP 137/87 | HR 99 | Temp 97.9°F | Ht 61.0 in | Wt 267.0 lb

## 2021-09-05 DIAGNOSIS — G4733 Obstructive sleep apnea (adult) (pediatric): Secondary | ICD-10-CM | POA: Diagnosis not present

## 2021-09-05 DIAGNOSIS — N319 Neuromuscular dysfunction of bladder, unspecified: Secondary | ICD-10-CM | POA: Insufficient documentation

## 2021-09-05 DIAGNOSIS — E559 Vitamin D deficiency, unspecified: Secondary | ICD-10-CM | POA: Diagnosis not present

## 2021-09-05 DIAGNOSIS — Z6841 Body Mass Index (BMI) 40.0 and over, adult: Secondary | ICD-10-CM

## 2021-09-05 DIAGNOSIS — G894 Chronic pain syndrome: Secondary | ICD-10-CM | POA: Diagnosis not present

## 2021-09-05 DIAGNOSIS — E1169 Type 2 diabetes mellitus with other specified complication: Secondary | ICD-10-CM

## 2021-09-05 DIAGNOSIS — R110 Nausea: Secondary | ICD-10-CM

## 2021-09-05 DIAGNOSIS — G9389 Other specified disorders of brain: Secondary | ICD-10-CM

## 2021-09-05 DIAGNOSIS — E669 Obesity, unspecified: Secondary | ICD-10-CM | POA: Diagnosis not present

## 2021-09-05 DIAGNOSIS — J301 Allergic rhinitis due to pollen: Secondary | ICD-10-CM

## 2021-09-05 DIAGNOSIS — Z79899 Other long term (current) drug therapy: Secondary | ICD-10-CM | POA: Diagnosis not present

## 2021-09-05 MED ORDER — ONDANSETRON 4 MG PO TBDP: 4.0000 mg | ORAL_TABLET | Freq: Four times a day (QID) | ORAL | 0 refills | Status: DC | PRN

## 2021-09-05 MED ORDER — OZEMPIC (0.25 OR 0.5 MG/DOSE) 2 MG/1.5ML ~~LOC~~ SOPN: 0.5000 mg | PEN_INJECTOR | SUBCUTANEOUS | 0 refills | Status: DC

## 2021-09-05 MED ORDER — LEVOCETIRIZINE DIHYDROCHLORIDE 5 MG PO TABS: 5.0000 mg | ORAL_TABLET | Freq: Every evening | ORAL | 0 refills | Status: DC

## 2021-09-05 MED ORDER — METHOCARBAMOL 500 MG PO TABS: ORAL_TABLET | ORAL | 0 refills | Status: DC

## 2021-09-05 MED ORDER — VITAMIN D (ERGOCALCIFEROL) 1.25 MG (50000 UNIT) PO CAPS: ORAL_CAPSULE | ORAL | 0 refills | Status: DC

## 2021-09-05 MED ORDER — GABAPENTIN 300 MG PO CAPS: 300.0000 mg | ORAL_CAPSULE | Freq: Two times a day (BID) | ORAL | 0 refills | Status: DC

## 2021-09-05 MED ORDER — MECLIZINE HCL 25 MG PO TABS: ORAL_TABLET | ORAL | 0 refills | Status: DC

## 2021-09-05 NOTE — Progress Notes (Signed)
Chief Complaint:   OBESITY Virginia Luna is here to discuss her progress with her obesity treatment plan along with follow-up of her obesity related diagnoses. See Medical Weight Management Flowsheet for complete bioelectrical impedance results.  Today's visit was #: 16 Starting weight: 288 lbs Starting date: 08/27/2019 Weight change since last visit: 3 lbs Total lbs lost to date: 21 lbs Total weight loss percentage to date: -7.29%  Nutrition Plan: Practicing portion control and making smarter food choices, such as increasing vegetables and decreasing simple carbohydrates for 60% of the time. Activity: None. Anti-obesity medications: Ozempic 0.5 mg subcutaneously weekly. Reported side effects: None.  Interim History: [name] saw Urology.  She needs to self-cath twice daily.  We will get records.  She says she vomited each time she tried to use the CPAP machine.  Will look into getting her oxygen at night.  Assessment/Plan:   Diagnoses and all orders for this visit:  Brain mass 09/02/21 Repeat MRI: Increased size of left superior cerebellar vermis mass, now measuring 1.5 x 1.5 cm, previously 1.1 x 1.0 cm. Increased mild surrounding edema. She has an upcoming appointment with Dr. Ronnald Ramp, her Neurosurgeon.   Severe OSA, unable to tolerate CPAP (vomited every night) 08/18/21 The total APNEA/HYPOPNEA INDEX (AHI) was 10.9/hour.  32 events occurred in REM sleep and 27 events in NREM. The REM AHI was  51.2 /hour, versus a non-REM AHI of 6.4. The patient spent 237 minutes of total sleep time in the supine position and 139 minutes in non-supine. The supine AHI was 8.3 versus a non-supine AHI of 15.1.   OXYGEN SATURATION & C02:  The Wake baseline 02 saturation was 92%, with the lowest being 75%. Time spent below 89% saturation equaled 76 minutes. The technologist added finally 1 liter oxygen at 3.30 AM to improve the saturation. This would have been 5 hours after medication intake.  IMPRESSION:    1.         Overall Mild Obstructive Sleep Apnea (OSA) with very Strong REM dependence. 2.         Sleep apnea was associated with severe sleep hypoxia. This required oxygen a supplementation at 3.30 AM at  1L. progressive hypoxia can be opiate induced.   3.         No evidence of Periodic Limb Movement Disorder (PLMD) 4.         Primary Snoring in supine sleep position.  5.         Heart rate variability corresponding to hypoxia reflected in EKG. 6.         Sleep architecture as documented showed moderate sleep fragmentation, high sleep efficiency.    RECOMMENDATIONS:   1.         Advise full night, attended, PAP titration study to optimize therapy. This patient would require oxygen as well should CPAP, BiPAP not increase the 02 saturation.     Larey Seat, MD Medical Director, [facility]  Chronic pain syndrome -     gabapentin (NEURONTIN) 300 MG capsule; Take 1 capsule (300 mg total) by mouth 2 (two) times daily with breakfast and lunch. -     methocarbamol (ROBAXIN) 500 MG tablet; TAKE ONE TABLET BY MOUTH every EIGHT hours AS NEEDED FOR MUSCLE SPASMS  Seasonal allergic rhinitis due to pollen -     levocetirizine (XYZAL) 5 MG tablet; Take 1 tablet (5 mg total) by mouth every evening.  Chronic nausea -     meclizine (ANTIVERT) 25 MG  tablet; TAKE ONE TABLET BY MOUTH EVERY 8 HOURS AS NEEDED FOR DIZZINESS -     ondansetron (ZOFRAN ODT) 4 MG disintegrating tablet; Take 1 tablet (4 mg total) by mouth every 6 (six) hours as needed for nausea or vomiting.  Type 2 diabetes mellitus with other specified complication, without long-term current use of insulin (HCC) -     Semaglutide,0.25 or 0.5MG /DOS, (OZEMPIC, 0.25 OR 0.5 MG/DOSE,) 2 MG/1.5ML SOPN; Inject 0.5 mg into the skin once a week. -     CBC with Differential/Platelet -     Comprehensive metabolic panel  Vitamin D deficiency -     Vitamin D, Ergocalciferol, (DRISDOL) 1.25 MG (50000 UNIT) CAPS capsule; TAKE ONE CAPSULE BY  MOUTH EVERY 7 DAYS  Neurogenic bladder, followed by Urology, instructed I&O cath BID New. Information from patient. Will request records.   Course: [name] is currently in the action stage of change. As such, her goal is to continue with weight loss efforts.   Nutrition goals: She has agreed to practicing portion control and making smarter food choices, such as increasing vegetables and decreasing simple carbohydrates.   Exercise goals: No exercise has been prescribed at this time.  Behavioral modification strategies: increasing lean protein intake, decreasing simple carbohydrates, increasing vegetables, increasing water intake, and emotional eating strategies.  [name] has agreed to follow-up with our clinic in 4 weeks. She was informed of the importance of frequent follow-up visits to maximize her success with intensive lifestyle modifications for her multiple health conditions.   Objective:   Blood pressure 137/87, pulse 99, temperature 97.9 F (36.6 C), height 5\' 1"  (1.549 m), weight 267 lb (121.1 kg), SpO2 97 %. Body mass index is 50.45 kg/m.  General: Cooperative, alert, well developed, in no acute distress. HEENT: Conjunctivae and lids unremarkable. Cardiovascular: Regular rhythm.  Lungs: Normal work of breathing. Neurologic: No focal deficits.   Lab Results  Component Value Date   CREATININE 0.65 02/14/2021   BUN 12 02/14/2021   NA 144 02/14/2021   K 3.8 02/14/2021   CL 101 02/14/2021   CO2 24 02/14/2021   Lab Results  Component Value Date   ALT 10 02/14/2021   AST 4 02/14/2021   GGT 75 (H) 02/08/2020   ALKPHOS 136 (H) 02/14/2021   BILITOT 0.5 02/14/2021   Lab Results  Component Value Date   HGBA1C 6.2 (H) 02/14/2021   HGBA1C 6.0 (H) 10/12/2020   HGBA1C 6.4 (H) 02/08/2020   Lab Results  Component Value Date   INSULIN 12.4 02/14/2021   INSULIN 10.9 10/12/2020   INSULIN 18.5 02/08/2020   INSULIN 15.9 08/27/2019   Lab Results  Component Value Date    TSH 0.458 02/14/2021   Lab Results  Component Value Date   CHOL 139 02/14/2021   HDL 70 02/14/2021   LDLCALC 56 02/14/2021   TRIG 66 02/14/2021   CHOLHDL 2.0 02/14/2021   Lab Results  Component Value Date   VD25OH 42.6 02/14/2021   VD25OH 38.1 10/12/2020   VD25OH 31.1 02/08/2020   Lab Results  Component Value Date   WBC 8.9 02/14/2021   HGB 11.6 02/14/2021   HCT 36.2 02/14/2021   MCV 84 02/14/2021   PLT 463 (H) 02/14/2021   Lab Results  Component Value Date   IRON 34 02/14/2021   TIBC 329 02/14/2021   FERRITIN 50 02/14/2021   Attestation Statements:   Reviewed by clinician on day of visit: allergies, medications, problem list, medical history, surgical history, family history, social history,  and previous encounter notes.  I, Water quality scientist, CMA, am acting as transcriptionist for Briscoe Deutscher, DO  I have reviewed the above documentation for accuracy and completeness, and I agree with the above. -  Briscoe Deutscher, DO, MS, FAAFP, DABOM - Family and Bariatric Medicine.

## 2021-09-06 ENCOUNTER — Ambulatory Visit: Payer: Medicare HMO | Admitting: Neurology

## 2021-09-06 DIAGNOSIS — G43709 Chronic migraine without aura, not intractable, without status migrainosus: Secondary | ICD-10-CM | POA: Diagnosis not present

## 2021-09-06 DIAGNOSIS — J3089 Other allergic rhinitis: Secondary | ICD-10-CM | POA: Diagnosis not present

## 2021-09-06 DIAGNOSIS — J301 Allergic rhinitis due to pollen: Secondary | ICD-10-CM | POA: Diagnosis not present

## 2021-09-06 LAB — COMPREHENSIVE METABOLIC PANEL
ALT: 11 IU/L (ref 0–32)
AST: 5 IU/L (ref 0–40)
Albumin/Globulin Ratio: 1.7 (ref 1.2–2.2)
Albumin: 4.6 g/dL (ref 3.8–4.9)
Alkaline Phosphatase: 176 IU/L — ABNORMAL HIGH (ref 44–121)
BUN/Creatinine Ratio: 29 — ABNORMAL HIGH (ref 9–23)
BUN: 15 mg/dL (ref 6–24)
Bilirubin Total: 0.2 mg/dL (ref 0.0–1.2)
CO2: 25 mmol/L (ref 20–29)
Calcium: 9.4 mg/dL (ref 8.7–10.2)
Chloride: 104 mmol/L (ref 96–106)
Creatinine, Ser: 0.52 mg/dL — ABNORMAL LOW (ref 0.57–1.00)
Globulin, Total: 2.7 g/dL (ref 1.5–4.5)
Glucose: 91 mg/dL (ref 70–99)
Potassium: 3.7 mmol/L (ref 3.5–5.2)
Sodium: 143 mmol/L (ref 134–144)
Total Protein: 7.3 g/dL (ref 6.0–8.5)
eGFR: 107 mL/min/{1.73_m2} (ref >59)

## 2021-09-06 LAB — CBC WITH DIFFERENTIAL/PLATELET
Basophils Absolute: 0 10*3/uL (ref 0.0–0.2)
Basos: 0 %
EOS (ABSOLUTE): 0.1 10*3/uL (ref 0.0–0.4)
Eos: 1 %
Hematocrit: 36 % (ref 34.0–46.6)
Hemoglobin: 11.6 g/dL (ref 11.1–15.9)
Immature Grans (Abs): 0.1 10*3/uL (ref 0.0–0.1)
Immature Granulocytes: 1 %
Lymphocytes Absolute: 2.2 10*3/uL (ref 0.7–3.1)
Lymphs: 21 %
MCH: 26.9 pg (ref 26.6–33.0)
MCHC: 32.2 g/dL (ref 31.5–35.7)
MCV: 84 fL (ref 79–97)
Monocytes Absolute: 0.6 10*3/uL (ref 0.1–0.9)
Monocytes: 5 %
Neutrophils Absolute: 7.6 10*3/uL — ABNORMAL HIGH (ref 1.4–7.0)
Neutrophils: 72 %
Platelets: 501 10*3/uL — ABNORMAL HIGH (ref 150–450)
RBC: 4.31 x10E6/uL (ref 3.77–5.28)
RDW: 16 % — ABNORMAL HIGH (ref 11.7–15.4)
WBC: 10.5 10*3/uL (ref 3.4–10.8)

## 2021-09-06 MED ORDER — ONABOTULINUMTOXINA 100 UNITS IJ SOLR
200.0000 [IU] | Freq: Once | INTRAMUSCULAR | Status: AC
  Administered 2021-09-06: 155 [IU] via INTRAMUSCULAR

## 2021-09-06 NOTE — Progress Notes (Signed)
Botulinum Clinic  ° °Procedure Note Botox ° °Attending: Dr. Atira Borello ° °Preoperative Diagnosis(es): Chronic migraine ° °Consent obtained from: The patient °Benefits discussed included, but were not limited to decreased muscle tightness, increased joint range of motion, and decreased pain.  Risk discussed included, but were not limited pain and discomfort, bleeding, bruising, excessive weakness, venous thrombosis, muscle atrophy and dysphagia.  Anticipated outcomes of the procedure as well as he risks and benefits of the alternatives to the procedure, and the roles and tasks of the personnel to be involved, were discussed with the patient, and the patient consents to the procedure and agrees to proceed. A copy of the patient medication guide was given to the patient which explains the blackbox warning. ° °Patients identity and treatment sites confirmed Yes.  . ° °Details of Procedure: °Skin was cleaned with alcohol. Prior to injection, the needle plunger was aspirated to make sure the needle was not within a blood vessel.  There was no blood retrieved on aspiration.   ° °Following is a summary of the muscles injected  And the amount of Botulinum toxin used: ° °Dilution °200 units of Botox was reconstituted with 4 ml of preservative free normal saline. °Time of reconstitution: At the time of the office visit (<30 minutes prior to injection)  ° °Injections  °155 total units of Botox was injected with a 30 gauge needle. ° °Injection Sites: °L occipitalis: 15 units- 3 sites  °R occiptalis: 15 units- 3 sites ° °L upper trapezius: 15 units- 3 sites °R upper trapezius: 15 units- 3 sits          °L paraspinal: 10 units- 2 sites °R paraspinal: 10 units- 2 sites ° °Face °L frontalis(2 injection sites):10 units   °R frontalis(2 injection sites):10 units         °L corrugator: 5 units   °R corrugator: 5 units           °Procerus: 5 units   °L temporalis: 20 units °R temporalis: 20 units  ° °Agent:  °200 units of botulinum Type  A (Onobotulinum Toxin type A) was reconstituted with 4 ml of preservative free normal saline.  °Time of reconstitution: At the time of the office visit (<30 minutes prior to injection)  ° ° ° Total injected (Units):  155 ° Total wasted (Units):  45 ° °Patient tolerated procedure well without complications.   °Reinjection is anticipated in 3 months. ° ° °

## 2021-09-07 DIAGNOSIS — G9389 Other specified disorders of brain: Secondary | ICD-10-CM | POA: Diagnosis not present

## 2021-09-11 ENCOUNTER — Encounter (INDEPENDENT_AMBULATORY_CARE_PROVIDER_SITE_OTHER): Payer: Self-pay | Admitting: Family Medicine

## 2021-09-11 DIAGNOSIS — G9389 Other specified disorders of brain: Secondary | ICD-10-CM | POA: Diagnosis not present

## 2021-09-12 ENCOUNTER — Other Ambulatory Visit: Payer: Self-pay | Admitting: Neurosurgery

## 2021-09-12 DIAGNOSIS — G9389 Other specified disorders of brain: Secondary | ICD-10-CM

## 2021-09-13 ENCOUNTER — Other Ambulatory Visit: Payer: Self-pay | Admitting: Neurosurgery

## 2021-09-13 DIAGNOSIS — K219 Gastro-esophageal reflux disease without esophagitis: Secondary | ICD-10-CM | POA: Diagnosis not present

## 2021-09-13 DIAGNOSIS — J3081 Allergic rhinitis due to animal (cat) (dog) hair and dander: Secondary | ICD-10-CM | POA: Diagnosis not present

## 2021-09-13 DIAGNOSIS — J301 Allergic rhinitis due to pollen: Secondary | ICD-10-CM | POA: Diagnosis not present

## 2021-09-13 DIAGNOSIS — J4520 Mild intermittent asthma, uncomplicated: Secondary | ICD-10-CM | POA: Diagnosis not present

## 2021-09-13 DIAGNOSIS — G9389 Other specified disorders of brain: Secondary | ICD-10-CM

## 2021-09-13 DIAGNOSIS — E7800 Pure hypercholesterolemia, unspecified: Secondary | ICD-10-CM | POA: Diagnosis not present

## 2021-09-13 DIAGNOSIS — E119 Type 2 diabetes mellitus without complications: Secondary | ICD-10-CM | POA: Diagnosis not present

## 2021-09-13 DIAGNOSIS — I10 Essential (primary) hypertension: Secondary | ICD-10-CM | POA: Diagnosis not present

## 2021-09-13 DIAGNOSIS — J3089 Other allergic rhinitis: Secondary | ICD-10-CM | POA: Diagnosis not present

## 2021-09-13 DIAGNOSIS — D649 Anemia, unspecified: Secondary | ICD-10-CM | POA: Diagnosis not present

## 2021-09-14 ENCOUNTER — Ambulatory Visit
Admission: RE | Admit: 2021-09-14 | Discharge: 2021-09-14 | Disposition: A | Discharge status: Home / Self Care | Payer: Medicare HMO | Source: Ambulatory Visit | Attending: Neurosurgery | Admitting: Neurosurgery

## 2021-09-14 ENCOUNTER — Other Ambulatory Visit: Payer: Self-pay

## 2021-09-14 DIAGNOSIS — G9389 Other specified disorders of brain: Secondary | ICD-10-CM

## 2021-09-15 ENCOUNTER — Other Ambulatory Visit (HOSPITAL_COMMUNITY): Payer: Self-pay | Admitting: Neurosurgery

## 2021-09-15 DIAGNOSIS — G9389 Other specified disorders of brain: Secondary | ICD-10-CM

## 2021-09-15 NOTE — Progress Notes (Signed)
Patient came in on 09-14-2021 for CT CAP with contrast but was not able to get IV access. Patient was sent home and [facility] office was called on 09-15-2021. I spoke to MD Amaryllis Dyke and was informed about the IV issue. She will be calling [facility] to get her scheduled there.

## 2021-09-18 ENCOUNTER — Ambulatory Visit (HOSPITAL_COMMUNITY)
Admission: RE | Admit: 2021-09-18 | Discharge: 2021-09-18 | Disposition: A | Discharge status: Home / Self Care | Payer: Medicare HMO | Source: Ambulatory Visit | Attending: Neurosurgery | Admitting: Neurosurgery

## 2021-09-18 ENCOUNTER — Other Ambulatory Visit: Payer: Self-pay

## 2021-09-18 DIAGNOSIS — E041 Nontoxic single thyroid nodule: Secondary | ICD-10-CM | POA: Diagnosis not present

## 2021-09-18 DIAGNOSIS — G9389 Other specified disorders of brain: Secondary | ICD-10-CM | POA: Insufficient documentation

## 2021-09-18 DIAGNOSIS — D1771 Benign lipomatous neoplasm of kidney: Secondary | ICD-10-CM | POA: Insufficient documentation

## 2021-09-18 DIAGNOSIS — Z8669 Personal history of other diseases of the nervous system and sense organs: Secondary | ICD-10-CM | POA: Diagnosis not present

## 2021-09-18 DIAGNOSIS — M47814 Spondylosis without myelopathy or radiculopathy, thoracic region: Secondary | ICD-10-CM | POA: Diagnosis not present

## 2021-09-18 DIAGNOSIS — M4184 Other forms of scoliosis, thoracic region: Secondary | ICD-10-CM | POA: Diagnosis not present

## 2021-09-18 DIAGNOSIS — M419 Scoliosis, unspecified: Secondary | ICD-10-CM | POA: Diagnosis not present

## 2021-09-18 MED ORDER — IOHEXOL 300 MG/ML  SOLN
100.0000 mL | Freq: Once | INTRAMUSCULAR | Status: AC | PRN
  Administered 2021-09-18: 100 mL via INTRAVENOUS

## 2021-09-20 ENCOUNTER — Encounter (INDEPENDENT_AMBULATORY_CARE_PROVIDER_SITE_OTHER): Payer: Self-pay

## 2021-09-20 DIAGNOSIS — J301 Allergic rhinitis due to pollen: Secondary | ICD-10-CM | POA: Diagnosis not present

## 2021-09-20 DIAGNOSIS — J3089 Other allergic rhinitis: Secondary | ICD-10-CM | POA: Diagnosis not present

## 2021-09-20 DIAGNOSIS — J3081 Allergic rhinitis due to animal (cat) (dog) hair and dander: Secondary | ICD-10-CM | POA: Diagnosis not present

## 2021-09-21 ENCOUNTER — Ambulatory Visit (INDEPENDENT_AMBULATORY_CARE_PROVIDER_SITE_OTHER): Payer: Medicare HMO | Admitting: Family Medicine

## 2021-09-21 ENCOUNTER — Other Ambulatory Visit: Payer: Self-pay

## 2021-09-21 ENCOUNTER — Encounter (INDEPENDENT_AMBULATORY_CARE_PROVIDER_SITE_OTHER): Payer: Self-pay | Admitting: Family Medicine

## 2021-09-21 VITALS — BP 148/84 | HR 89 | Temp 97.6°F | Ht 61.0 in | Wt 269.0 lb

## 2021-09-21 DIAGNOSIS — G9389 Other specified disorders of brain: Secondary | ICD-10-CM | POA: Diagnosis not present

## 2021-09-21 DIAGNOSIS — E669 Obesity, unspecified: Secondary | ICD-10-CM | POA: Diagnosis not present

## 2021-09-21 DIAGNOSIS — Z7985 Long-term (current) use of injectable non-insulin antidiabetic drugs: Secondary | ICD-10-CM

## 2021-09-21 DIAGNOSIS — E1169 Type 2 diabetes mellitus with other specified complication: Secondary | ICD-10-CM

## 2021-09-21 DIAGNOSIS — F4323 Adjustment disorder with mixed anxiety and depressed mood: Secondary | ICD-10-CM

## 2021-09-21 DIAGNOSIS — R296 Repeated falls: Secondary | ICD-10-CM

## 2021-09-21 DIAGNOSIS — R300 Dysuria: Secondary | ICD-10-CM

## 2021-09-21 DIAGNOSIS — Z6841 Body Mass Index (BMI) 40.0 and over, adult: Secondary | ICD-10-CM

## 2021-09-21 MED ORDER — CIPROFLOXACIN HCL 250 MG PO TABS: 250.0000 mg | ORAL_TABLET | Freq: Two times a day (BID) | ORAL | 0 refills | Status: AC

## 2021-09-25 ENCOUNTER — Other Ambulatory Visit: Payer: Self-pay

## 2021-09-25 ENCOUNTER — Encounter: Payer: Self-pay | Admitting: Physical Medicine and Rehabilitation

## 2021-09-25 ENCOUNTER — Encounter: Payer: Medicare HMO | Attending: Physical Medicine and Rehabilitation | Admitting: Physical Medicine and Rehabilitation

## 2021-09-25 VITALS — BP 139/85 | HR 92 | Temp 98.0°F | Ht 61.0 in | Wt 273.0 lb

## 2021-09-25 DIAGNOSIS — M797 Fibromyalgia: Secondary | ICD-10-CM

## 2021-09-25 DIAGNOSIS — G9389 Other specified disorders of brain: Secondary | ICD-10-CM | POA: Diagnosis not present

## 2021-09-25 DIAGNOSIS — G894 Chronic pain syndrome: Secondary | ICD-10-CM | POA: Diagnosis not present

## 2021-09-25 DIAGNOSIS — M7918 Myalgia, other site: Secondary | ICD-10-CM

## 2021-09-25 NOTE — Progress Notes (Signed)
Pt is a 61 yr old female with hx of fibromyalgia- dx'd at age 66, DM2,- Ac1 6.2;  asthma; HTN, OSA, BMI is 50; Nodule in L superior cerebellum. Also has migraines- intractable and daily; has trace aura;  No kidney issues- Cr 0.65 and BUN 15 in 6/22. Also end stage OA of B/L knees medially and R hip pain. Also has myofascial pain syndrome.  Has new brain mass that was documented.   Is L superior cerebellar vermis mass- 1.5x1.5 cm- increased surround edema.   Has noticed an improvement with pain- with Trileptal- also helps with sleep some. 10% better than before.   Had been using pedal bike- has fallen out- ~ 2 weeks ago- at home. - extra dizzy lately.   Was doing Magnesium- felt like helpful- but will use less.    Having twitching/and like R side, feels like not working- dropped cup on R side- scared about it- dropping things a lot.    Plan:  Patient here for trigger point injections for migraines/myofascial pain  Consent done and on chart.  Cleaned areas with alcohol and injected using a 27 gauge 1.5 inch needle  Injected  6cc Using 1% Lidocaine with no EPI  Upper traps- B/L  Levators- B/L  Posterior scalenes Middle scalenes- B/L  Splenius Capitus- B/L  Pectoralis Major- B/L  Rhomboids- B/L - R side 3x Infraspinatus Teres Major/minor Thoracic paraspinals Lumbar paraspinals- B/L  Other injections-   Patient's level of pain prior was 810 Current level of pain after injections is 6/10- already better  There was no bleeding or complications.  Patient was advised to drink a lot of water on day after injections to flush system Will have increased soreness for 12-48 hours after injections.  Can use Lidocaine patches the day AFTER injections Can use theracane on day of injections in places didn't inject Can use ice/heating pad 4-6 hours AFTER injections  2. Use theracane at least 3 days/week- no more than 30 minutes in a day- alternate days on muscles.   3. Na level was 143  - so can continue the Trileptal 900 mg nightly- has enough refills.    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 Discussed brain mass and how to handle her plans.    6. F/U in 6-8 weeks- trigger point injections- wait list and every 8 weeks.   I spent  34   minutes on appointment today; more than 50% of that time was spent on educating patient as per details/plan above. 10 minutes on injections- rest discussing brain mass.

## 2021-09-25 NOTE — Progress Notes (Signed)
Chief Complaint:   OBESITY Virginia Luna is here to discuss her progress with her obesity treatment plan along with follow-up of her obesity related diagnoses. See Medical Weight Management Flowsheet for complete bioelectrical impedance results.  Today's visit was #: 76 Starting weight: 288 lbs Starting date: 08/27/2019 Weight change since last visit: +2 lbs Total lbs lost to date: 19 lbs Total weight loss percentage to date: -6.69%  Nutrition Plan: Practicing portion control and making smarter food choices, such as increasing vegetables and decreasing simple carbohydrates for 40% of the time. Activity: Increased walking. Anti-obesity medications: Ozempic 0.5 mg subcutaneously weekly. Reported side effects: None.  Interim History: Virginia Luna had a visit with Dr. Ronnald Ramp to discuss her enlarging brain mass.  Today, we discussed home safety.  Considering treatment in [place].  Continues to be followed by Urology.  Assessment/Plan:   1. Dysuria Will check UA and culture today.   - Urine Culture - Urinalysis, Routine w reflex microscopic  2. Brain mass 09/02/21 Repeat MRI: Increased size of left superior cerebellar vermis mass, now measuring 1.5 x 1.5 cm, previously 1.1 x 1.0 cm. Increased mild surrounding edema. We will continue to monitor symptoms and follow along closely.  3. Type 2 diabetes mellitus with other specified complication, without long-term current use of insulin (HCC) Diabetes Mellitus: Not at goal. Medication: Ozempic 0.5 mg subcutaneously weekly. Issues reviewed: blood sugar goals, complications of diabetes mellitus, hypoglycemia prevention and treatment, exercise, and nutrition.  Plan: The patient will continue to focus on protein-rich, low simple carbohydrate foods. We reviewed the importance of hydration, regular exercise for stress reduction, and restorative sleep.   Lab Results  Component Value Date   HGBA1C 6.2 (H) 02/14/2021   HGBA1C 6.0 (H)  10/12/2020   HGBA1C 6.4 (H) 02/08/2020   Lab Results  Component Value Date   LDLCALC 56 02/14/2021   CREATININE 0.52 (L) 09/05/2021   4. Frequent falls The patient understands monitoring parameters and red flags.   5. Situational mixed anxiety and depressive disorder Followed by Dr. Clovis Pu.  Will continue to monitor as it relates to her weight loss journey.  6. Obesity, current BMI 50.9  Course: Virginia Luna is currently in the action stage of change. As such, her goal is to continue with weight loss efforts.   Nutrition goals: She has agreed to practicing portion control and making smarter food choices, such as increasing vegetables and decreasing simple carbohydrates.   Exercise goals:  As tolerated.  Behavioral modification strategies: increasing lean protein intake and no skipping meals.  Virginia Luna has agreed to follow-up with our clinic in 2 weeks. She was informed of the importance of frequent follow-up visits to maximize her success with intensive lifestyle modifications for her multiple health conditions.   Objective:   Blood pressure (!) 148/84, pulse 89, temperature 97.6 F (36.4 C), temperature source Oral, height 5\' 1"  (1.549 m), weight 269 lb (122 kg), SpO2 96 %. Body mass index is 50.83 kg/m.  General: Cooperative, alert, well developed, in no acute distress. HEENT: Conjunctivae and lids unremarkable. Cardiovascular: Regular rhythm.  Lungs: Normal work of breathing. Neurologic: No focal deficits.   Lab Results  Component Value Date   CREATININE 0.52 (L) 09/05/2021   BUN 15 09/05/2021   NA 143 09/05/2021   K 3.7 09/05/2021   CL 104 09/05/2021   CO2 25 09/05/2021   Lab Results  Component Value Date   ALT 11 09/05/2021   AST 5 09/05/2021   GGT 75 (H)  02/08/2020   ALKPHOS 176 (H) 09/05/2021   BILITOT 0.2 09/05/2021   Lab Results  Component Value Date   HGBA1C 6.2 (H) 02/14/2021   HGBA1C 6.0 (H) 10/12/2020   HGBA1C 6.4 (H) 02/08/2020   Lab Results   Component Value Date   INSULIN 12.4 02/14/2021   INSULIN 10.9 10/12/2020   INSULIN 18.5 02/08/2020   INSULIN 15.9 08/27/2019   Lab Results  Component Value Date   TSH 0.458 02/14/2021   Lab Results  Component Value Date   CHOL 139 02/14/2021   HDL 70 02/14/2021   LDLCALC 56 02/14/2021   TRIG 66 02/14/2021   CHOLHDL 2.0 02/14/2021   Lab Results  Component Value Date   VD25OH 42.6 02/14/2021   VD25OH 38.1 10/12/2020   VD25OH 31.1 02/08/2020   Lab Results  Component Value Date   WBC 10.5 09/05/2021   HGB 11.6 09/05/2021   HCT 36.0 09/05/2021   MCV 84 09/05/2021   PLT 501 (H) 09/05/2021   Lab Results  Component Value Date   IRON 34 02/14/2021   TIBC 329 02/14/2021   FERRITIN 50 02/14/2021   Attestation Statements:   Reviewed by clinician on day of visit: allergies, medications, problem list, medical history, surgical history, family history, social history, and previous encounter notes.  Time spent on visit including pre-visit chart review and post-visit care and documentation was 43 minutes. Time was spent on: preparing to see the patient (eg, review of tests), obtaining history, performing a medically necessary appropriate examination and/or evaluation, counseling and educating the patient/family/caregiver, ordering medications, tests, or procedures, documenting clinical information in the electronic or other health, and Independently interpreting results and communicating results to patient.   I, Water quality scientist, CMA, am acting as transcriptionist for Briscoe Deutscher, DO  I have reviewed the above documentation for accuracy and completeness, and I agree with the above. -  Briscoe Deutscher, DO, MS, FAAFP, DABOM - Family and Bariatric Medicine.

## 2021-09-25 NOTE — Patient Instructions (Signed)
Plan:  Patient here for trigger point injections for migraines/myofascial pain  Consent done and on chart.  Cleaned areas with alcohol and injected using a 27 gauge 1.5 inch needle  Injected  6cc Using 1% Lidocaine with no EPI  Upper traps- B/L  Levators- B/L  Posterior scalenes Middle scalenes- B/L  Splenius Capitus- B/L  Pectoralis Major- B/L  Rhomboids- B/L - R side 3x Infraspinatus Teres Major/minor Thoracic paraspinals Lumbar paraspinals- B/L  Other injections-   Patient's level of pain prior was 810 Current level of pain after injections is 6/10- already better  There was no bleeding or complications.  Patient was advised to drink a lot of water on day after injections to flush system Will have increased soreness for 12-48 hours after injections.  Can use Lidocaine patches the day AFTER injections Can use theracane on day of injections in places didn't inject Can use ice/heating pad 4-6 hours AFTER injections  2. Use theracane at least 3 days/week- no more than 30 minutes in a day- alternate days on muscles. Get youtube videos to watch on it.   3. Na level was 143 - so can continue the Trileptal 900 mg nightly- has enough refills.    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 Discussed brain mass and how to handle her plans.    6. F/U in 6-8 weeks- trigger point injections- wait list and every 8 weeks.

## 2021-09-27 DIAGNOSIS — J3089 Other allergic rhinitis: Secondary | ICD-10-CM | POA: Diagnosis not present

## 2021-09-27 DIAGNOSIS — J3081 Allergic rhinitis due to animal (cat) (dog) hair and dander: Secondary | ICD-10-CM | POA: Diagnosis not present

## 2021-09-27 DIAGNOSIS — J301 Allergic rhinitis due to pollen: Secondary | ICD-10-CM | POA: Diagnosis not present

## 2021-09-27 LAB — URINALYSIS, ROUTINE W REFLEX MICROSCOPIC
Bilirubin, UA: NEGATIVE
Glucose, UA: NEGATIVE
Ketones, UA: NEGATIVE
Nitrite, UA: NEGATIVE
Specific Gravity, UA: 1.018 (ref 1.005–1.030)
Urobilinogen, Ur: 0.2 mg/dL (ref 0.2–1.0)
pH, UA: 6.5 (ref 5.0–7.5)

## 2021-09-27 LAB — URINE CULTURE

## 2021-09-27 LAB — MICROSCOPIC EXAMINATION
Casts: NONE SEEN /lpf
WBC, UA: >30 /hpf — AB (ref 0–5)

## 2021-09-28 ENCOUNTER — Telehealth (INDEPENDENT_AMBULATORY_CARE_PROVIDER_SITE_OTHER): Payer: Self-pay | Admitting: Family Medicine

## 2021-09-28 NOTE — Telephone Encounter (Signed)
Virginia Luna with [facility] called to let Dr. Juleen China aware patient had urodynamics study done not a CT at their office.

## 2021-10-01 ENCOUNTER — Other Ambulatory Visit (INDEPENDENT_AMBULATORY_CARE_PROVIDER_SITE_OTHER): Payer: Self-pay | Admitting: Family Medicine

## 2021-10-01 DIAGNOSIS — J301 Allergic rhinitis due to pollen: Secondary | ICD-10-CM

## 2021-10-02 ENCOUNTER — Telehealth: Payer: Self-pay

## 2021-10-02 DIAGNOSIS — D332 Benign neoplasm of brain, unspecified: Secondary | ICD-10-CM | POA: Diagnosis not present

## 2021-10-02 NOTE — Telephone Encounter (Signed)
LOV w/ Wallace

## 2021-10-02 NOTE — Telephone Encounter (Signed)
Returned pt's call and LVM for pt to call me back to schedule sleep study ° °

## 2021-10-03 NOTE — Telephone Encounter (Signed)
Faxed request

## 2021-10-04 ENCOUNTER — Ambulatory Visit (INDEPENDENT_AMBULATORY_CARE_PROVIDER_SITE_OTHER): Payer: Medicare HMO | Admitting: Family Medicine

## 2021-10-04 ENCOUNTER — Other Ambulatory Visit: Payer: Self-pay

## 2021-10-04 ENCOUNTER — Encounter: Payer: Self-pay | Admitting: Physical Medicine and Rehabilitation

## 2021-10-04 ENCOUNTER — Encounter: Payer: Medicare HMO | Admitting: Physical Medicine and Rehabilitation

## 2021-10-04 VITALS — BP 142/87 | HR 109 | Ht 61.0 in | Wt 271.0 lb

## 2021-10-04 DIAGNOSIS — G894 Chronic pain syndrome: Secondary | ICD-10-CM | POA: Diagnosis not present

## 2021-10-04 DIAGNOSIS — M797 Fibromyalgia: Secondary | ICD-10-CM | POA: Diagnosis not present

## 2021-10-04 DIAGNOSIS — G9389 Other specified disorders of brain: Secondary | ICD-10-CM

## 2021-10-04 DIAGNOSIS — M7918 Myalgia, other site: Secondary | ICD-10-CM

## 2021-10-04 NOTE — Patient Instructions (Signed)
Pt is a 61 yr old female with hx of fibromyalgia- dx'd at age 66, DM2,- Ac1 6.2;  asthma; HTN, OSA, BMI is 50; Nodule in L superior cerebellum. Also has migraines- intractable and daily; has trace aura;  No kidney issues- Cr 0.65 and BUN 15 in 6/22. Also end stage OA of B/L knees medially and R hip pain. Also has myofascial pain syndrome.  Has new brain mass that was documented Worsening cerebellar Sx's.    Call surgeon to let them know new symptoms of writing and holding things problems.    2. Patient here for trigger point injections for  Consent done and on chart.  Cleaned areas with alcohol and injected using a 27 gauge 1.5 inch needle  Injected  6cc Using 1% Lidocaine with no EPI  Upper traps B/L  Levators- B/L  Posterior scalenes- B/L  Middle scalenes- B/ L Splenius Capitus- B/L  Pectoralis Major Rhomboids- B/ L x3 ion R and 2 on L Infraspinatus Teres Major/minor Thoracic paraspinals Lumbar paraspinals- B/ L Other injections-    Patient's level of pain prior was 9/10 Current level of pain after injections is- 7/10  There was no bleeding or complications.  Patient was advised to drink a lot of water on day after injections to flush system Will have increased soreness for 12-48 hours after injections.  Can use Lidocaine patches the day AFTER injections Can use theracane on day of injections in places didn't inject Can use heating pad 4-6 hours AFTER injections  3. Need to speak with surgeon about timing of surgery-   4 Let me know when surgery is!  5. F/U already scheduled.  Will con't treatment.

## 2021-10-04 NOTE — Progress Notes (Signed)
Subjective:    Patient ID: Virginia Luna, female    DOB: 1961-08-15, 61 y.o.   MRN: [id number]  HPI Pt is a 61 yr old female with hx of fibromyalgia- dx'd at age 74, DM2,- Ac1 6.2;  asthma; HTN, OSA, BMI is 50; Nodule in L superior cerebellum. Also has migraines- intractable and daily; has trace aura;  No kidney issues- Cr 0.65 and BUN 15 in 6/22. Also end stage OA of B/L knees medially and R hip pain. Also has myofascial pain syndrome.  Has new brain mass that was documented   Has 2nd part of sleep apnea test this week- with O2.  Wants trigger point injections.  Was helpful.   Lesion has grown/mass- doesn't want it to block spinal fluid. Wants to do surgery- crani-  Needs to get pre-op done with PCP.  Now told need to get things done ASAP. Got another call the next day.   Not scheduled yet.  Seeing Dr Kenton Kingfisher 2/27- and sleep study 2/24.    Harder to write- very hard to write actually- has a tremor- can't tell where in space.  And dropping things a lot.   Nose started bleeding at church- got a bag- really bad- lasted 20 minutes- had associated head pain at the same time.    Pain Inventory Average Pain 8 Pain Right Now 9 My pain is constant, sharp, dull, tingling, and aching  In the last 24 hours, has pain interfered with the following? General activity 8 Relation with others 8 Enjoyment of life 8 What TIME of day is your pain at its worst? varies Sleep (in general) Fair  Pain is worse with: walking, bending, sitting, standing, and some activites Pain improves with: rest, medication, and injections Relief from Meds: 6  Family History  Problem Relation Age of Onset   Breast cancer Paternal 61    Breast cancer Paternal Grandmother    Obesity Mother    Diabetes Father    High blood pressure Father    Sudden death Father    Social History   Socioeconomic History   Marital status: Single    Spouse name: Not on file   Number of children: Not on file   Years  of education: Not on file   Highest education level: Some college, no degree  Occupational History   Occupation: disabled    Comment: Psychologist, occupational at daycare  Tobacco Use   Smoking status: Never   Smokeless tobacco: Never  Vaping Use   Vaping Use: Never used  Substance and Sexual Activity   Alcohol use: No   Drug use: No   Sexual activity: Not Currently  Other Topics Concern   Not on file  Social History Narrative   Lives w roommate   Right handed   Caffeine: 2 cups of tea a week. 2 sodas a week   Social Determinants of Radio broadcast assistant Strain: Not on file  Food Insecurity: Not on file  Transportation Needs: Not on file  Physical Activity: Not on file  Stress: Not on file  Social Connections: Not on file   Past Surgical History:  Procedure Laterality Date   BREAST BIOPSY     BREAST EXCISIONAL BIOPSY     BREAST LUMPECTOMY WITH RADIOACTIVE SEED LOCALIZATION Bilateral 11/24/2014   Procedure: BILATERAL BREAST LUMPECTOMY WITH RADIOACTIVE SEED LOCALIZATION;  Surgeon: [name], MD;  Location: [facility];  Service: General;  Laterality: Bilateral;   CHOLECYSTECTOMY     COLONOSCOPY  DILATION AND CURETTAGE OF UTERUS     ESOPHAGOGASTRODUODENOSCOPY (EGD) WITH PROPOFOL N/A 07/23/2016   Procedure: ESOPHAGOGASTRODUODENOSCOPY (EGD) WITH PROPOFOL;  Surgeon: Laurence Spates, MD;  Location: [facility];  Service: Endoscopy;  Laterality: N/A;   ESOPHAGOGASTRODUODENOSCOPY (EGD) WITH PROPOFOL N/A 06/11/2018   Procedure: ESOPHAGOGASTRODUODENOSCOPY (EGD) WITH PROPOFOL;  Surgeon: Laurence Spates, MD;  Location: [facility];  Service: Endoscopy;  Laterality: N/A;   LUMBAR LAMINECTOMY  2010   ORIF WRIST FRACTURE Right 11/24/2020   Procedure: OPEN REDUCTION INTERNAL FIXATION RIGHT WRIST FRACTURE;  Surgeon: Renette Butters, MD;  Location: [facility];  Service: Orthopedics;  Laterality: Right;   UMBILICAL HERNIA REPAIR     age 5   UPPER GI ENDOSCOPY     Past Surgical  History:  Procedure Laterality Date   BREAST BIOPSY     BREAST EXCISIONAL BIOPSY     BREAST LUMPECTOMY WITH RADIOACTIVE SEED LOCALIZATION Bilateral 11/24/2014   Procedure: BILATERAL BREAST LUMPECTOMY WITH RADIOACTIVE SEED LOCALIZATION;  Surgeon: [name], MD;  Location: [facility];  Service: General;  Laterality: Bilateral;   CHOLECYSTECTOMY     COLONOSCOPY     DILATION AND CURETTAGE OF UTERUS     ESOPHAGOGASTRODUODENOSCOPY (EGD) WITH PROPOFOL N/A 07/23/2016   Procedure: ESOPHAGOGASTRODUODENOSCOPY (EGD) WITH PROPOFOL;  Surgeon: Laurence Spates, MD;  Location: [facility];  Service: Endoscopy;  Laterality: N/A;   ESOPHAGOGASTRODUODENOSCOPY (EGD) WITH PROPOFOL N/A 06/11/2018   Procedure: ESOPHAGOGASTRODUODENOSCOPY (EGD) WITH PROPOFOL;  Surgeon: Laurence Spates, MD;  Location: [facility];  Service: Endoscopy;  Laterality: N/A;   LUMBAR LAMINECTOMY  2010   ORIF WRIST FRACTURE Right 11/24/2020   Procedure: OPEN REDUCTION INTERNAL FIXATION RIGHT WRIST FRACTURE;  Surgeon: Renette Butters, MD;  Location: [facility];  Service: Orthopedics;  Laterality: Right;   UMBILICAL HERNIA REPAIR     age 62   UPPER GI ENDOSCOPY     Past Medical History:  Diagnosis Date   Allergies    Anemia    Arthritis    Asthma    Back pain    Chronic pain    Complication of anesthesia    woke up during colonoscopy   Diabetes ([place])    Diabetes mellitus without complication (HCC)    Edema, lower extremity    Fibroid    Fibromyalgia    Chronic   GERD (gastroesophageal reflux disease)    Headache    migraines   High blood pressure    History of hiatal hernia    History of stomach ulcers    IBS (irritable bowel syndrome)    Joint pain    Sleep apnea    did use cpap-lost 25lb-says she does not need it   NO CPAP   Wears contact lenses    BP (!) 142/87    Pulse (!) 109    Ht 5\' 1"  (1.549 m)    Wt 271 lb (122.9 kg)    LMP  (LMP Unknown)    SpO2 94%    BMI 51.21 kg/m   Opioid Risk Score:   Fall  Risk Score:  `1  Depression screen PHQ 2/9  Depression screen [facility] 2/9 10/04/2021 09/25/2021 07/03/2021 08/27/2019  Decreased Interest 0 0 2 3  Down, Depressed, Hopeless 0 0 0 0  PHQ - 2 Score 0 0 2 3  Altered sleeping - - 3 1  Tired, decreased energy - - 3 1  Change in appetite - - 1 1  Feeling bad or failure about yourself  - - 0 1  Trouble concentrating - - 2 0  Moving slowly or fidgety/restless - - 0 0  Suicidal thoughts - - 0 0  PHQ-9 Score - - 11 7  Difficult doing work/chores - - Very difficult Somewhat difficult  Some recent data might be hidden    Review of Systems  Musculoskeletal:  Positive for myalgias.  Neurological:  Positive for tremors, weakness and numbness.  All other systems reviewed and are negative.     Objective:   Physical Exam  Trigger points as detailed below      Assessment & Plan:   Pt is a 61 yr old female with hx of fibromyalgia- dx'd at age 89, DM2,- Ac1 6.2;  asthma; HTN, OSA, BMI is 50; Nodule in L superior cerebellum. Also has migraines- intractable and daily; has trace aura;  No kidney issues- Cr 0.65 and BUN 15 in 6/22. Also end stage OA of B/L knees medially and R hip pain. Also has myofascial pain syndrome.  Has new brain mass that was documented Worsening cerebellar Sx's.    Call surgeon to let them know new symptoms of writing and holding things problems.    2. Patient here for trigger point injections for  Consent done and on chart.  Cleaned areas with alcohol and injected using a 27 gauge 1.5 inch needle  Injected  6cc Using 1% Lidocaine with no EPI  Upper traps B/L  Levators- B/L  Posterior scalenes- B/L  Middle scalenes- B/ L Splenius Capitus- B/L  Pectoralis Major Rhomboids- B/ L x3 ion R and 2 on L Infraspinatus Teres Major/minor Thoracic paraspinals Lumbar paraspinals- B/ L Other injections-    Patient's level of pain prior was 9/10 Current level of pain after injections is- 7/10  There was no bleeding or  complications.  Patient was advised to drink a lot of water on day after injections to flush system Will have increased soreness for 12-48 hours after injections.  Can use Lidocaine patches the day AFTER injections Can use theracane on day of injections in places didn't inject Can use heating pad 4-6 hours AFTER injections  3. Need to speak with surgeon about timing of surgery-   4 Let me know when surgery is!  5. F/U already scheduled.  Will con't treatment.    I spent a total of 25   minutes on total care today- >50% coordination of care- due to 10 minutes for injections and to discuss surgery for cerebellar mass.

## 2021-10-05 ENCOUNTER — Encounter (INDEPENDENT_AMBULATORY_CARE_PROVIDER_SITE_OTHER): Payer: Self-pay

## 2021-10-05 DIAGNOSIS — J301 Allergic rhinitis due to pollen: Secondary | ICD-10-CM | POA: Diagnosis not present

## 2021-10-05 DIAGNOSIS — J3089 Other allergic rhinitis: Secondary | ICD-10-CM | POA: Diagnosis not present

## 2021-10-05 DIAGNOSIS — J3081 Allergic rhinitis due to animal (cat) (dog) hair and dander: Secondary | ICD-10-CM | POA: Diagnosis not present

## 2021-10-05 DIAGNOSIS — R32 Unspecified urinary incontinence: Secondary | ICD-10-CM | POA: Insufficient documentation

## 2021-10-05 DIAGNOSIS — R3914 Feeling of incomplete bladder emptying: Secondary | ICD-10-CM | POA: Diagnosis not present

## 2021-10-05 DIAGNOSIS — R8271 Bacteriuria: Secondary | ICD-10-CM | POA: Diagnosis not present

## 2021-10-06 ENCOUNTER — Other Ambulatory Visit (INDEPENDENT_AMBULATORY_CARE_PROVIDER_SITE_OTHER): Payer: Self-pay | Admitting: Family Medicine

## 2021-10-06 DIAGNOSIS — J301 Allergic rhinitis due to pollen: Secondary | ICD-10-CM

## 2021-10-09 NOTE — Telephone Encounter (Signed)
Last OV with Dr Wallace 

## 2021-10-10 DIAGNOSIS — E7800 Pure hypercholesterolemia, unspecified: Secondary | ICD-10-CM | POA: Diagnosis not present

## 2021-10-10 DIAGNOSIS — I10 Essential (primary) hypertension: Secondary | ICD-10-CM | POA: Diagnosis not present

## 2021-10-10 DIAGNOSIS — E119 Type 2 diabetes mellitus without complications: Secondary | ICD-10-CM | POA: Diagnosis not present

## 2021-10-11 DIAGNOSIS — J3081 Allergic rhinitis due to animal (cat) (dog) hair and dander: Secondary | ICD-10-CM | POA: Diagnosis not present

## 2021-10-11 DIAGNOSIS — J3089 Other allergic rhinitis: Secondary | ICD-10-CM | POA: Diagnosis not present

## 2021-10-11 DIAGNOSIS — J301 Allergic rhinitis due to pollen: Secondary | ICD-10-CM | POA: Diagnosis not present

## 2021-10-12 ENCOUNTER — Other Ambulatory Visit (INDEPENDENT_AMBULATORY_CARE_PROVIDER_SITE_OTHER): Payer: Self-pay | Admitting: Family Medicine

## 2021-10-12 DIAGNOSIS — J301 Allergic rhinitis due to pollen: Secondary | ICD-10-CM

## 2021-10-12 NOTE — Telephone Encounter (Signed)
Dr.Wallace °

## 2021-10-13 ENCOUNTER — Other Ambulatory Visit: Payer: Self-pay

## 2021-10-13 ENCOUNTER — Telehealth: Payer: Self-pay

## 2021-10-13 ENCOUNTER — Ambulatory Visit (INDEPENDENT_AMBULATORY_CARE_PROVIDER_SITE_OTHER): Payer: Medicare HMO | Admitting: Neurology

## 2021-10-13 DIAGNOSIS — Z6841 Body Mass Index (BMI) 40.0 and over, adult: Secondary | ICD-10-CM

## 2021-10-13 DIAGNOSIS — G4733 Obstructive sleep apnea (adult) (pediatric): Secondary | ICD-10-CM

## 2021-10-13 DIAGNOSIS — Z789 Other specified health status: Secondary | ICD-10-CM

## 2021-10-13 DIAGNOSIS — G4701 Insomnia due to medical condition: Secondary | ICD-10-CM

## 2021-10-13 DIAGNOSIS — G4736 Sleep related hypoventilation in conditions classified elsewhere: Secondary | ICD-10-CM

## 2021-10-13 DIAGNOSIS — K760 Fatty (change of) liver, not elsewhere classified: Secondary | ICD-10-CM

## 2021-10-13 DIAGNOSIS — G8929 Other chronic pain: Secondary | ICD-10-CM

## 2021-10-13 DIAGNOSIS — G4734 Idiopathic sleep related nonobstructive alveolar hypoventilation: Secondary | ICD-10-CM

## 2021-10-13 NOTE — Telephone Encounter (Signed)
New message   Danielle Dess Key: Elisabeth Pigeon help? Call us at [phone] Outcome Additional Information Required The PA system cannot find any matching drug for the NDC/Drug sent. PA cannot be created. Please contact your system administrator. Drug Botox 200UNIT solution Form Nurse, adult and Medical Benefit PA Form

## 2021-10-13 NOTE — Telephone Encounter (Signed)
New message   Benefit Verification [id number] Submitted! For BV Basic submissions, please allow 1 business day for results.  For BV Full submissions, please allow 2 business days for results.

## 2021-10-16 ENCOUNTER — Encounter (HOSPITAL_COMMUNITY): Payer: Self-pay | Admitting: Radiology

## 2021-10-16 DIAGNOSIS — M797 Fibromyalgia: Secondary | ICD-10-CM | POA: Diagnosis not present

## 2021-10-16 DIAGNOSIS — J4520 Mild intermittent asthma, uncomplicated: Secondary | ICD-10-CM | POA: Diagnosis not present

## 2021-10-16 DIAGNOSIS — I10 Essential (primary) hypertension: Secondary | ICD-10-CM | POA: Diagnosis not present

## 2021-10-16 DIAGNOSIS — G894 Chronic pain syndrome: Secondary | ICD-10-CM | POA: Diagnosis not present

## 2021-10-16 DIAGNOSIS — E7800 Pure hypercholesterolemia, unspecified: Secondary | ICD-10-CM | POA: Diagnosis not present

## 2021-10-16 DIAGNOSIS — K219 Gastro-esophageal reflux disease without esophagitis: Secondary | ICD-10-CM | POA: Diagnosis not present

## 2021-10-16 DIAGNOSIS — D329 Benign neoplasm of meninges, unspecified: Secondary | ICD-10-CM | POA: Diagnosis not present

## 2021-10-16 DIAGNOSIS — E119 Type 2 diabetes mellitus without complications: Secondary | ICD-10-CM | POA: Diagnosis not present

## 2021-10-17 DIAGNOSIS — I10 Essential (primary) hypertension: Secondary | ICD-10-CM | POA: Diagnosis not present

## 2021-10-18 ENCOUNTER — Encounter (INDEPENDENT_AMBULATORY_CARE_PROVIDER_SITE_OTHER): Payer: Self-pay

## 2021-10-18 ENCOUNTER — Ambulatory Visit (INDEPENDENT_AMBULATORY_CARE_PROVIDER_SITE_OTHER): Payer: Medicare HMO | Admitting: Family Medicine

## 2021-10-18 ENCOUNTER — Telehealth (INDEPENDENT_AMBULATORY_CARE_PROVIDER_SITE_OTHER): Payer: Self-pay | Admitting: Family Medicine

## 2021-10-18 DIAGNOSIS — J3081 Allergic rhinitis due to animal (cat) (dog) hair and dander: Secondary | ICD-10-CM | POA: Diagnosis not present

## 2021-10-18 DIAGNOSIS — J3089 Other allergic rhinitis: Secondary | ICD-10-CM | POA: Diagnosis not present

## 2021-10-18 DIAGNOSIS — J301 Allergic rhinitis due to pollen: Secondary | ICD-10-CM | POA: Diagnosis not present

## 2021-10-18 NOTE — Telephone Encounter (Signed)
Pt has requested that Dr. Juleen China call her as soon as she is back in the office. Pt was disappointed that she came in for her appt today 10/18/2021, but it had been cancelled due to Dr. Alcario Drought absence. ?

## 2021-10-19 ENCOUNTER — Other Ambulatory Visit (INDEPENDENT_AMBULATORY_CARE_PROVIDER_SITE_OTHER): Payer: Self-pay | Admitting: Family Medicine

## 2021-10-19 ENCOUNTER — Telehealth: Payer: Self-pay

## 2021-10-19 ENCOUNTER — Encounter (INDEPENDENT_AMBULATORY_CARE_PROVIDER_SITE_OTHER): Payer: Self-pay | Admitting: Family Medicine

## 2021-10-19 DIAGNOSIS — G4736 Sleep related hypoventilation in conditions classified elsewhere: Secondary | ICD-10-CM | POA: Insufficient documentation

## 2021-10-19 DIAGNOSIS — J301 Allergic rhinitis due to pollen: Secondary | ICD-10-CM

## 2021-10-19 MED ORDER — MONTELUKAST SODIUM 10 MG PO TABS: 10.0000 mg | ORAL_TABLET | Freq: Every day | ORAL | 11 refills | Status: DC

## 2021-10-19 NOTE — Telephone Encounter (Addendum)
-----   Message from Larey Seat, MD sent at 10/19/2021 12:18 PM EST ----- ?DIAGNOSIS: ?1)Obstructive Sleep Apnea in this previously CPAP intolerant patient responded to CPAP at 13 cm water under use of 3 cm EPR and a Small Eson 2 nasal mask. ?2) REM dependent hypoxia: REM Sleep Related Hypoxemia persistent under CPAP up to 11 cm water pressure- up to 2 minutes consecutively and the higher-pressure settings did not include REM sleep. ?? ?PLANS/RECOMMENDATIONS: auto CPAP will be set to the pressure range as recorded.  ?1. CPAP therapy compliance is defined as 4 hours or more of nightly use. ?2. Any apnea patient should avoid sedatives, hypnotics, and alcohol consumption at bedtime.  ?3. ONO on CPAP needed before follow up.  ?? ? DISCUSSION : A follow up appointment will be scheduled in the Sleep Clinic at [facility].   Please call [phone] with any questions.    ?

## 2021-10-19 NOTE — Addendum Note (Signed)
Addended by: Larey Seat on: 10/19/2021 12:20 PM   Modules accepted: Orders

## 2021-10-19 NOTE — Progress Notes (Signed)
DIAGNOSIS: 1)Obstructive Sleep Apnea in this previously CPAP intolerant patient responded to CPAP at 13 cm water under use of 3 cm EPR and a Small Eson 2 nasal mask. 2) REM dependent hypoxia: REM Sleep Related Hypoxemia persistent under CPAP up to 11 cm water pressure- up to 2 minutes consecutively and the higher-pressure settings did not include REM sleep.  PLANS/RECOMMENDATIONS: auto CPAP will be set to the pressure range as recorded.  1. CPAP therapy compliance is defined as 4 hours or more of nightly use. 2. Any apnea patient should avoid sedatives, hypnotics, and alcohol consumption at bedtime.  3. ONO on CPAP needed before follow up.    DISCUSSION : A follow up appointment will be scheduled in the Sleep Clinic at [facility].   Please call [phone] with any questions.

## 2021-10-19 NOTE — Telephone Encounter (Signed)
Refill request

## 2021-10-19 NOTE — Telephone Encounter (Signed)
I called pt. I advised pt that Dr. Brett Fairy reviewed their sleep study results and found that pt did well during titration study. Dr. Brett Fairy recommends that pt start auto cpap. I reviewed PAP compliance expectations with the pt. Pt is agreeable to starting an auto-PAP. I advised pt that an order will be sent to a DME, [facility], and [facility] will call the pt within about one week after they file with the pt's insurance. [facility] will show the pt how to use the machine, fit for masks, and troubleshoot the auto-PAP if needed. A follow up appt was made for insurance purposes with Amy, NP on 12/28/2021 at 900 am. Pt verbalized understanding to arrive 15 minutes early and bring their auto-PAP. A letter with all of this information in it will be mailed to the pt as a reminder. I verified with the pt that the address we have on file is correct. Pt verbalized understanding of results. Pt had no questions at this time but was encouraged to call back if questions arise. I have sent the order to [facility] and have received confirmation that they have received the order. ? ?

## 2021-10-19 NOTE — Procedures (Signed)
PATIENT'S NAME:  Virginia Luna, Virginia Luna DOB:      01-Aug-1961      MR#:    [id number]     DATE OF RECORDING: 10/13/2021 REFERRING M.D.:  Briscoe Deutscher, DO Study Performed:   CPAP  Titration HISTORY:  Virginia Luna was a new patient seen in Consultation on 07-24-2021, a right-handed [place] American female with chronic insomnia- related to pain The patient is followed for fibromyalgia and pain by her primary Neurologist Dr Tomi Likens, DO.  The patient returned after SPLIT night study on 08-18-2021- IMPRESSION:  Overall Mild Obstructive Sleep Apnea (OSA) with very Strong REM  dependence.  Sleep apnea was associated with severe sleep hypoxia. This  required oxygen a supplementation at 3.30 AM at 1L. progressive  hypoxia can be opiate induced.    No evidence of Periodic Limb Movement Disorder (PLMD)  Primary Snoring in supine sleep position.  Heart rate variability corresponding to hypoxia reflected in EKG.  Sleep architecture as documented showed moderate sleep  fragmentation, high sleep efficiency.   RECOMMENDATIONS:  Advise full night, attended, CPAP titration study to optimize  therapy. This patient would require oxygen as well should CPAP,  BiPAP not increase the 02 saturation.        The patient endorsed the Epworth Sleepiness Scale at 9/24 points. The patient's weight 267 pounds with a height of 61 (inches), resulting in a BMI of 50.4 kg/m2. The patient's neck circumference measured 15 inches.  CURRENT MEDICATIONS: Proventil, Norvasc, Lipitor, Astelin, Flexeril, Voltaren, Epinephrine, Flonase, Breo Ellipta, Neurontin, Hydrocodone, Xyzal, Linzess, Antivert, Robaxin, Singulair, Botox, Zofran, Trileptal, Oxycodone, Protonix, Seroquel, Maxalt, Ozempic, Vit D    PROCEDURE:  This is a multichannel digital polysomnogram utilizing the SomnoStar 11.2 system.  Electrodes and sensors were applied and monitored per AASM Specifications.   EEG, EOG, Chin and Limb EMG, were sampled at 200 Hz.  ECG, Snore  and Nasal Pressure, Thermal Airflow, Respiratory Effort, CPAP Flow and Pressure, Oximetry was sampled at 50 Hz. Digital video and audio were recorded.      CPAP was initiated under a small Eson 2 nasal mask at 5 cmH20 with heated humidity per AASM standards and CPAP pressure was advanced to 13 cmH20 because of hypopneas, apneas and desaturations.  At a PAP pressure of 13 cmH20, there was a reduction of the AHI to 1.2/h with improvement of sleep apnea.  Lights Out was at 22:01 and Lights On at 05:41. Total recording time (TRT) was 460.5 minutes, with a total sleep time (TST) of 420.5 minutes. The patient's sleep latency was 28 minutes. REM latency was 36.5 minutes.  The sleep efficiency was 91.3 %.    SLEEP ARCHITECTURE: WASO (Wake after sleep onset) was 11.5 minutes.  There were 1 minutes in Stage N1, 256 minutes Stage N2, 117.5 minutes Stage N3 and 46 minutes in Stage REM.  The percentage of Stage N1 was .2%, Stage N2 was 60.9%, Stage N3 was 27.9% and Stage R (REM sleep) was 10.9%. The sleep architecture was notable for short intervals of REM sleep, associated with hypopnea.   RESPIRATORY ANALYSIS:  There was a total of 40 respiratory events: 1 obstructive , 1 central apnea and 0 mixed apneas with 38 hypopneas , in REM sleep.  The total APNEA/HYPOPNEA INDEX (AHI) was 5.7 /hour.   24 events occurred in REM sleep and 16 events in NREM. The REM AHI was 31.3 /hour versus a non-REM AHI of 2.6 /hour.   The patient spent 277.5 minutes of total sleep time in  the supine position and 143 minutes in non-supine. The supine AHI was 5.6, versus a non-supine AHI of 5.9.  OXYGEN SATURATION & C02:  The baseline 02 saturation was 94%, with 02 nadir being 80%. Time spent below 89% saturation equaled 42 minutes. The arousals were noted as: 8 were spontaneous, 0 were associated with PLMs, 1 was associated with respiratory events. The patient had a total of 0 Periodic Limb Movements.  Audio and video analysis did not  show any abnormal or unusual movements, behaviors, phonations or vocalizations.   EKG was in keeping with sinus rhythm, but a regular fast heart rate was present. Average sleep heart rate was 91 bpm.   DIAGNOSIS: 1)Obstructive Sleep Apnea in this previously CPAP intolerant patient responded to CPAP at 13 cm water under use of 3 cm EPR and a Small Eson 2 nasal mask. 2) REM dependent hypoxia: REM Sleep Related Hypoxemia persistent under CPAP up to 11 cm water pressure- up to 2 minutes consecutively and the higher-pressure settings did not include REM sleep.  PLANS/RECOMMENDATIONS: CPAP therapy compliance is defined as 4 hours or more of nightly use. Any apnea patient should avoid sedatives, hypnotics, and alcohol consumption at bedtime.  ONO on CPAP needed before follow up.    DISCUSSION : A follow up appointment will be scheduled in the Sleep Clinic at [facility].   Please call [phone] with any questions.      I certify that I have reviewed the entire raw data recording prior to the issuance of this report in accordance with the Standards of Accreditation of the American Academy of Sleep Medicine (AASM)      [] Larey Seat, M.D. Diplomat, Tax adviser of Psychiatry and Neurology  Diplomat, Tax adviser of Sleep Medicine Market researcher, [employer] [facility] at [employer]

## 2021-10-20 ENCOUNTER — Telehealth: Payer: Self-pay | Admitting: Neurology

## 2021-10-20 NOTE — Telephone Encounter (Signed)
Pt has called to inform Megan, RN that the place she was referred to for her CPAP is out of network, pt is asking for a call with the name of another company to try. ?

## 2021-10-21 ENCOUNTER — Encounter: Payer: Self-pay | Admitting: Physical Medicine and Rehabilitation

## 2021-10-24 ENCOUNTER — Encounter (INDEPENDENT_AMBULATORY_CARE_PROVIDER_SITE_OTHER): Payer: Self-pay | Admitting: Family Medicine

## 2021-10-25 DIAGNOSIS — J3089 Other allergic rhinitis: Secondary | ICD-10-CM | POA: Diagnosis not present

## 2021-10-25 DIAGNOSIS — J301 Allergic rhinitis due to pollen: Secondary | ICD-10-CM | POA: Diagnosis not present

## 2021-10-25 DIAGNOSIS — J3081 Allergic rhinitis due to animal (cat) (dog) hair and dander: Secondary | ICD-10-CM | POA: Diagnosis not present

## 2021-10-27 ENCOUNTER — Other Ambulatory Visit: Payer: Self-pay | Admitting: Neurology

## 2021-10-30 DIAGNOSIS — J3081 Allergic rhinitis due to animal (cat) (dog) hair and dander: Secondary | ICD-10-CM | POA: Diagnosis not present

## 2021-10-30 DIAGNOSIS — J3089 Other allergic rhinitis: Secondary | ICD-10-CM | POA: Diagnosis not present

## 2021-10-30 DIAGNOSIS — J301 Allergic rhinitis due to pollen: Secondary | ICD-10-CM | POA: Diagnosis not present

## 2021-11-01 ENCOUNTER — Other Ambulatory Visit: Payer: Self-pay | Admitting: Psychiatry

## 2021-11-01 ENCOUNTER — Other Ambulatory Visit: Payer: Self-pay

## 2021-11-01 ENCOUNTER — Ambulatory Visit (INDEPENDENT_AMBULATORY_CARE_PROVIDER_SITE_OTHER): Payer: Medicare HMO | Admitting: Family Medicine

## 2021-11-01 ENCOUNTER — Encounter (INDEPENDENT_AMBULATORY_CARE_PROVIDER_SITE_OTHER): Payer: Self-pay | Admitting: Family Medicine

## 2021-11-01 VITALS — BP 151/89 | HR 92 | Temp 97.7°F | Ht 61.0 in | Wt 270.0 lb

## 2021-11-01 DIAGNOSIS — G9389 Other specified disorders of brain: Secondary | ICD-10-CM

## 2021-11-01 DIAGNOSIS — Z6841 Body Mass Index (BMI) 40.0 and over, adult: Secondary | ICD-10-CM

## 2021-11-01 DIAGNOSIS — Z7985 Long-term (current) use of injectable non-insulin antidiabetic drugs: Secondary | ICD-10-CM | POA: Diagnosis not present

## 2021-11-01 DIAGNOSIS — G894 Chronic pain syndrome: Secondary | ICD-10-CM

## 2021-11-01 DIAGNOSIS — F5105 Insomnia due to other mental disorder: Secondary | ICD-10-CM

## 2021-11-01 DIAGNOSIS — E669 Obesity, unspecified: Secondary | ICD-10-CM

## 2021-11-01 DIAGNOSIS — Z79899 Other long term (current) drug therapy: Secondary | ICD-10-CM

## 2021-11-01 DIAGNOSIS — E1169 Type 2 diabetes mellitus with other specified complication: Secondary | ICD-10-CM | POA: Diagnosis not present

## 2021-11-02 DIAGNOSIS — R900 Intracranial space-occupying lesion found on diagnostic imaging of central nervous system: Secondary | ICD-10-CM | POA: Diagnosis not present

## 2021-11-02 DIAGNOSIS — D332 Benign neoplasm of brain, unspecified: Secondary | ICD-10-CM | POA: Diagnosis not present

## 2021-11-02 LAB — HEMOGLOBIN A1C
Est. average glucose Bld gHb Est-mCnc: 137 mg/dL
Hgb A1c MFr Bld: 6.4 % — ABNORMAL HIGH (ref 4.8–5.6)

## 2021-11-06 DIAGNOSIS — J3081 Allergic rhinitis due to animal (cat) (dog) hair and dander: Secondary | ICD-10-CM | POA: Diagnosis not present

## 2021-11-06 DIAGNOSIS — J3089 Other allergic rhinitis: Secondary | ICD-10-CM | POA: Diagnosis not present

## 2021-11-06 DIAGNOSIS — J301 Allergic rhinitis due to pollen: Secondary | ICD-10-CM | POA: Diagnosis not present

## 2021-11-07 DIAGNOSIS — R251 Tremor, unspecified: Secondary | ICD-10-CM | POA: Diagnosis not present

## 2021-11-07 DIAGNOSIS — R059 Cough, unspecified: Secondary | ICD-10-CM | POA: Diagnosis not present

## 2021-11-07 DIAGNOSIS — R220 Localized swelling, mass and lump, head: Secondary | ICD-10-CM | POA: Diagnosis not present

## 2021-11-07 DIAGNOSIS — E559 Vitamin D deficiency, unspecified: Secondary | ICD-10-CM | POA: Diagnosis not present

## 2021-11-07 DIAGNOSIS — Z9889 Other specified postprocedural states: Secondary | ICD-10-CM | POA: Diagnosis not present

## 2021-11-07 DIAGNOSIS — I10 Essential (primary) hypertension: Secondary | ICD-10-CM | POA: Diagnosis not present

## 2021-11-07 DIAGNOSIS — G9389 Other specified disorders of brain: Secondary | ICD-10-CM | POA: Diagnosis not present

## 2021-11-07 DIAGNOSIS — M79604 Pain in right leg: Secondary | ICD-10-CM | POA: Diagnosis not present

## 2021-11-07 DIAGNOSIS — I639 Cerebral infarction, unspecified: Secondary | ICD-10-CM | POA: Diagnosis not present

## 2021-11-07 DIAGNOSIS — D431 Neoplasm of uncertain behavior of brain, infratentorial: Secondary | ICD-10-CM | POA: Diagnosis not present

## 2021-11-07 DIAGNOSIS — G4733 Obstructive sleep apnea (adult) (pediatric): Secondary | ICD-10-CM | POA: Diagnosis not present

## 2021-11-07 DIAGNOSIS — M79605 Pain in left leg: Secondary | ICD-10-CM | POA: Diagnosis not present

## 2021-11-07 DIAGNOSIS — N390 Urinary tract infection, site not specified: Secondary | ICD-10-CM | POA: Diagnosis not present

## 2021-11-07 DIAGNOSIS — Z48811 Encounter for surgical aftercare following surgery on the nervous system: Secondary | ICD-10-CM | POA: Diagnosis not present

## 2021-11-07 DIAGNOSIS — D332 Benign neoplasm of brain, unspecified: Secondary | ICD-10-CM | POA: Diagnosis not present

## 2021-11-07 DIAGNOSIS — D496 Neoplasm of unspecified behavior of brain: Secondary | ICD-10-CM | POA: Diagnosis not present

## 2021-11-07 DIAGNOSIS — G4730 Sleep apnea, unspecified: Secondary | ICD-10-CM | POA: Diagnosis not present

## 2021-11-07 DIAGNOSIS — B9620 Unspecified Escherichia coli [E. coli] as the cause of diseases classified elsewhere: Secondary | ICD-10-CM | POA: Diagnosis not present

## 2021-11-07 DIAGNOSIS — E785 Hyperlipidemia, unspecified: Secondary | ICD-10-CM | POA: Diagnosis not present

## 2021-11-07 DIAGNOSIS — Z7951 Long term (current) use of inhaled steroids: Secondary | ICD-10-CM | POA: Diagnosis not present

## 2021-11-07 DIAGNOSIS — J45909 Unspecified asthma, uncomplicated: Secondary | ICD-10-CM | POA: Diagnosis not present

## 2021-11-07 DIAGNOSIS — Z6841 Body Mass Index (BMI) 40.0 and over, adult: Secondary | ICD-10-CM | POA: Diagnosis not present

## 2021-11-07 DIAGNOSIS — E119 Type 2 diabetes mellitus without complications: Secondary | ICD-10-CM | POA: Diagnosis not present

## 2021-11-07 DIAGNOSIS — Z79899 Other long term (current) drug therapy: Secondary | ICD-10-CM | POA: Diagnosis not present

## 2021-11-07 DIAGNOSIS — Z23 Encounter for immunization: Secondary | ICD-10-CM | POA: Diagnosis present

## 2021-11-07 DIAGNOSIS — G8918 Other acute postprocedural pain: Secondary | ICD-10-CM | POA: Diagnosis not present

## 2021-11-07 HISTORY — PX: BRAIN SURGERY: SHX531

## 2021-11-08 DIAGNOSIS — E119 Type 2 diabetes mellitus without complications: Secondary | ICD-10-CM | POA: Diagnosis not present

## 2021-11-08 DIAGNOSIS — D496 Neoplasm of unspecified behavior of brain: Secondary | ICD-10-CM | POA: Diagnosis not present

## 2021-11-08 DIAGNOSIS — G9389 Other specified disorders of brain: Secondary | ICD-10-CM | POA: Diagnosis not present

## 2021-11-09 DIAGNOSIS — I639 Cerebral infarction, unspecified: Secondary | ICD-10-CM | POA: Diagnosis not present

## 2021-11-11 ENCOUNTER — Encounter: Payer: Self-pay | Admitting: *Deleted

## 2021-11-11 ENCOUNTER — Encounter: Payer: Self-pay | Admitting: Physical Medicine and Rehabilitation

## 2021-11-11 ENCOUNTER — Encounter (INDEPENDENT_AMBULATORY_CARE_PROVIDER_SITE_OTHER): Payer: Self-pay | Admitting: Family Medicine

## 2021-11-11 NOTE — PMR Pre-admission (Signed)
PMR Admission Coordinator Pre-Admission Assessment ? ?Patient: Virginia Luna is an 61 y.o., female ?MRN: [id number] ?DOB: March 02, 1961 ?Height: $RemoveBefor'5\' 1"'GpFGFhBHlFtV$  (1.549 m) ?Weight: 124.5 kg ? 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 She requests team conference notes when available ?Benefits:  Phone #: [phone]     Name: online at [email or web address] ?Eff. Date: 08/20/21     Deduct: $0      Out of Pocket Max: $3400 (met [id number])      Life Max: N/A ?CIR: $295 days 1-6 max $1770/admission      SNF: $0 days 1-20; $196 days 21-100 ?Outpatient: [facility]     Co-Pay: $20/visit ?Home Health: 100%      Co-Pay: none ?DME: 80%     Co-Pay: 20% ?Providers: in network ? ?SECONDARY:       Policy#:       Phone#:  ? ?Financial Counselor:       Phone#:  ? ?The ?Data Collection Information Summary? for patients in Inpatient Rehabilitation Facilities with attached ?Privacy Act [place] Records? was provided and verbally reviewed with: Patient ? ?Emergency Contact Information ?Contact Information   ? ? Name Relation Home Work Mobile  ? Lacey,avis Other   [phone]  ? Scales,Herbert Significant other [phone]  [phone]  ? Bradly Bienenstock   [phone]  ? ?  ? ? ?Current Medical History  ?Patient Admitting Diagnosis: L superior cerebellar vermis tumor; L suboccipital craniotomy for resection of tumor ? ?History of Present Illness: A 61 yo femal who recently presented with headaches and tremors.  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  Was admitted to [facility] on 11/07/21.  Underwent Left suboccipital craniotomy for resection of tumor by Dr. Herschel Senegal on 11/07/21.  PT/OT evaluations were completed with recommendations for acute inpatient rehab admission. ? ? ?Patient's medical record from [facility] in [place], [place] has been reviewed by the rehabilitation admission coordinator and physician. ? ?Past Medical History  ?Past Medical History:  ?Diagnosis Date  ? Allergies   ? Anemia   ? Arthritis   ? Asthma   ? Back pain   ? Chronic pain   ? Complication of anesthesia   ? woke up during colonoscopy  ? Diabetes ([place])   ? Diabetes mellitus without complication ([place])   ? Edema, lower extremity   ? Fibroid   ? Fibromyalgia   ? Chronic  ? GERD (gastroesophageal reflux disease)   ? Headache   ? migraines  ? High blood pressure   ? History of hiatal hernia   ? History of stomach ulcers   ? IBS (irritable bowel syndrome)   ? Joint pain   ? Sleep apnea   ? did use cpap-lost 25lb-says she does not need it   NO CPAP  ? Wears contact lenses   ? ? ?Has the patient had major  surgery during 100 days prior to admission? Yes ? ?Family History   ?family history includes Breast cancer in her paternal aunt and paternal grandmother; Diabetes in her father; High blood pressure in her father; Obesity in her mother; Sudden death in her father. ? ?Current Medications ? ?Current Outpatient Medications:  ?  albuterol (PROVENTIL HFA;VENTOLIN HFA) 108 (90 BASE) MCG/ACT inhaler, Inhale 2 puffs into the lungs every 6 (six) hours as needed for wheezing or shortness of breath. , Disp: , Rfl:  ?  amLODipine (NORVASC) 5 MG tablet, Take 5 mg by mouth daily., Disp: , Rfl:  ?  atorvastatin (LIPITOR) 10 MG tablet, Take 10 mg by mouth at bedtime., Disp: , Rfl:  ?  azelastine (ASTELIN) 0.1 % nasal spray, Place 1 spray into both nostrils 2 (two) times daily as needed for allergies., Disp: , Rfl: 2 ?  blood glucose meter kit and supplies, Dispense based on patient and  insurance preference. Use up to four times daily as directed. (FOR ICD-10 E10.9, E11.9)., Disp: 1 each, Rfl: 0 ?  cyclobenzaprine (FLEXERIL) 10 MG tablet, as needed., Disp: , Rfl:  ?  diclofenac (VOLTAREN) 75 MG EC tablet, , Disp: , Rfl:  ?  EPINEPHrine 0.3 mg/0.3 mL IJ SOAJ injection, Inject 0.3 mg into the muscle as needed for anaphylaxis., Disp: , Rfl:  ?  fluticasone (FLONASE) 50 MCG/ACT nasal spray, Place 1 spray into both nostrils daily as needed for allergies., Disp: , Rfl:  ?  fluticasone furoate-vilanterol (BREO ELLIPTA) 200-25 MCG/INH AEPB, Inhale 1 puff daily into the lungs., Disp: , Rfl:  ?  gabapentin (NEURONTIN) 300 MG capsule, Take 1 capsule (300 mg total) by mouth 2 (two) times daily with breakfast and lunch., Disp: 60 capsule, Rfl: 0 ?  HYDROcodone-acetaminophen (NORCO/VICODIN) 5-325 MG tablet, Take 1 tablet by mouth 2 (two) times daily as needed for moderate pain. , Disp: , Rfl: 0 ?  Insulin Pen Needle (BD PEN NEEDLE NANO 2ND GEN) 32G X 4 MM MISC, 1 Package by Does not apply route daily., Disp: 100 each, Rfl: 0 ?  levocetirizine (XYZAL) 5 MG tablet, Take 1 tablet (5 mg total) by mouth every evening., Disp: 90 tablet, Rfl: 0 ?  linaclotide (LINZESS) 72 MCG capsule, Take 1 capsule (72 mcg total) by mouth daily as needed (constipation)., Disp: 90 capsule, Rfl: 0 ?  meclizine (ANTIVERT) 25 MG tablet, TAKE ONE TABLET BY MOUTH EVERY 8 HOURS AS NEEDED FOR DIZZINESS, Disp: 30 tablet, Rfl: 0 ?  methocarbamol (ROBAXIN) 500 MG tablet, TAKE ONE TABLET BY MOUTH every EIGHT hours AS NEEDED FOR MUSCLE SPASMS, Disp: 20 tablet, Rfl: 0 ?  montelukast (SINGULAIR) 10 MG tablet, Take 1 tablet (10 mg total) by mouth at bedtime., Disp: 30 tablet, Rfl: 11 ?  OnabotulinumtoxinA (BOTOX IJ), Inject as directed every 3 (three) months., Disp: , Rfl:  ?  ondansetron (ZOFRAN ODT) 4 MG disintegrating tablet, Take 1 tablet (4 mg total) by mouth every 6 (six) hours as needed for nausea or vomiting., Disp: 20 tablet, Rfl: 0 ?   Oxcarbazepine (TRILEPTAL) 300 MG tablet, Take 1 tablet (300 mg total) by mouth at bedtime. X 1 week, then 600 mg nightly x1 week; can increase to 900 mg nightly- -for nerve pain, Disp: 90 tablet, Rfl: 5 ?  oxyCODONE (OXY IR/ROXICODONE) 5 MG immediate release tablet, as needed., Disp: , Rfl:  ?  pantoprazole (PROTONIX) 40 MG tablet, Take 40 mg by mouth 2 (two) times daily., Disp: , Rfl: 1 ?  QUEtiapine (  SEROQUEL) 300 MG tablet, Take 1 tablet (300 mg total) by mouth at bedtime., Disp: 30 tablet, Rfl: 11 ?  rizatriptan (MAXALT-MLT) 10 MG disintegrating tablet, TAKE ONE TABLET BY MOUTH AT earliest ONSET of migraine. MAY REPEAT in TWO hours IF needed. max of TWO tabs in 24 hours, Disp: 10 tablet, Rfl: 1 ?  Semaglutide,0.25 or 0.5MG /DOS, (OZEMPIC, 0.25 OR 0.5 MG/DOSE,) 2 MG/1.5ML SOPN, Inject 0.5 mg into the skin once a week., Disp: 1.5 mL, Rfl: 0 ?  Vitamin D, Ergocalciferol, (DRISDOL) 1.25 MG (50000 UNIT) CAPS capsule, TAKE ONE CAPSULE BY MOUTH EVERY 7 DAYS, Disp: 4 capsule, Rfl: 0 ?  zolpidem (AMBIEN) 10 MG tablet, Take 1 tablet (10 mg total) by mouth at bedtime., Disp: 30 tablet, Rfl: 5 ? ?Patients Current Diet: Diet Soft and Bite sized (IDDSI 6) Thin liquids  ? ?Precautions / Restrictions ?Precautions: Fall ?  ? ?Has the patient had 2 or more falls or a fall with injury in the past year? Yes ? 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 ? ? ?Prior Functional Level ?Self Care: Did the patient need help bathing, dressing, using the toilet or eating? Independent ? ?Indoor Mobility: Did the patient need assistance with walking from room to room (with or without device)? Independent ? ?Stairs: Did the patient need assistance with internal or external stairs (with or without device)? Independent ? ?Functional Cognition: Did the patient need help planning regular tasks such as shopping or remembering to take medications? Independent ? ?Patient Information ?Are you of Hispanic,  Latino/a,or Spanish origin?: A. No, not of Hispanic, Latino/a, or Spanish origin ?What is your race?: B. Black or African American ?Do you need or want an interpreter to communicate with a doctor or health care staff?: 0.

## 2021-11-13 NOTE — Progress Notes (Signed)
Chief Complaint:   OBESITY Virginia Luna is here to discuss her progress with her obesity treatment plan along with follow-up of her obesity related diagnoses. See Medical Weight Management Flowsheet for complete bioelectrical impedance results.  Today's visit was #: 50 Starting weight: 288 lbs Starting date: 08/27/2019 Weight change since last visit: +1 lb Total lbs lost to date: 18 lbs Total weight loss percentage to date: -6.25%  Nutrition Plan: Practicing portion control and making smarter food choices, such as increasing vegetables and decreasing simple carbohydrates for 0% of the time. Activity: None. Anti-obesity medications: Ozempic 0.5 mg subcutaneously weekly. Reported side effects: None.  Interim History: Virginia Luna say Urology is still wanting her to self-cath daily.  Upcoming Neurosurgery discussed.  She is wearing her CPAP, but says she is pulling it off during sleep.  Assessment/Plan:   1. Type 2 diabetes mellitus with other specified complication, without long-term current use of insulin (HCC) Diabetes Mellitus: Not at goal. Medication: Ozempic 0.5 mg subcutaneously weely. Issues reviewed: blood sugar goals, complications of diabetes mellitus, hypoglycemia prevention and treatment, exercise, and nutrition.  Plan: The patient will continue to focus on protein-rich, low simple carbohydrate foods. We reviewed the importance of hydration, regular exercise for stress reduction, and restorative sleep.   Lab Results  Component Value Date   HGBA1C 6.4 (H) 11/01/2021   HGBA1C 6.2 (H) 02/14/2021   HGBA1C 6.0 (H) 10/12/2020   Lab Results  Component Value Date   LDLCALC 56 02/14/2021   CREATININE 0.52 (L) 09/05/2021   - Hemoglobin A1c  2. Brain mass 09/02/21 Repeat MRI: Increased size of left superior cerebellar vermis mass, now measuring 1.5 x 1.5 cm, previously 1.1 x 1.0 cm. Increased mild surrounding edema. We will continue to monitor symptoms and follow along closely.  Neurosurgery in [place] next week.  3. Chronic pain syndrome Continue gabapentin (NEURONTIN) 300 MG capsule; Take 1 capsule (300 mg total) by mouth 2 (two) times daily with breakfast and lunch. Continue methocarbamol (ROBAXIN) 500 MG tablet; TAKE ONE TABLET BY MOUTH every EIGHT hours AS NEEDED FOR MUSCLE SPASMS  4. Polypharmacy Medication list reviewed. Discussed medications to hold for now. Other substances:  Will stop magnesium due to diarrhea.  5. Obesity, current BMI 51.1  Course: Virginia Luna is currently in the action stage of change. As such, her goal is to continue with weight loss efforts.   Nutrition goals: She has agreed to practicing portion control and making smarter food choices, such as increasing vegetables and decreasing simple carbohydrates.   Exercise goals:  As is.  Behavioral modification strategies: increasing lean protein intake and decreasing simple carbohydrates.  Virginia Luna has agreed to follow-up with our clinic in 6 weeks. She was informed of the importance of frequent follow-up visits to maximize her success with intensive lifestyle modifications for her multiple health conditions.   Objective:   Blood pressure (!) 151/89, pulse 92, temperature 97.7 F (36.5 C), temperature source Oral, height '5\' 1"'$  (1.549 m), weight 270 lb (122.5 kg), SpO2 98 %. Body mass index is 51.02 kg/m.  General: Cooperative, alert, well developed, in no acute distress. HEENT: Conjunctivae and lids unremarkable. Cardiovascular: Regular rhythm.  Lungs: Normal work of breathing. Neurologic: No focal deficits.   Lab Results  Component Value Date   CREATININE 0.52 (L) 09/05/2021   BUN 15 09/05/2021   NA 143 09/05/2021   K 3.7 09/05/2021   CL 104 09/05/2021   CO2 25 09/05/2021   Lab Results  Component Value Date  ALT 11 09/05/2021   AST 5 09/05/2021   GGT 75 (H) 02/08/2020   ALKPHOS 176 (H) 09/05/2021   BILITOT 0.2 09/05/2021   Lab Results  Component Value Date   HGBA1C  6.4 (H) 11/01/2021   HGBA1C 6.2 (H) 02/14/2021   HGBA1C 6.0 (H) 10/12/2020   HGBA1C 6.4 (H) 02/08/2020   Lab Results  Component Value Date   INSULIN 12.4 02/14/2021   INSULIN 10.9 10/12/2020   INSULIN 18.5 02/08/2020   INSULIN 15.9 08/27/2019   Lab Results  Component Value Date   TSH 0.458 02/14/2021   Lab Results  Component Value Date   CHOL 139 02/14/2021   HDL 70 02/14/2021   LDLCALC 56 02/14/2021   TRIG 66 02/14/2021   CHOLHDL 2.0 02/14/2021   Lab Results  Component Value Date   VD25OH 42.6 02/14/2021   VD25OH 38.1 10/12/2020   VD25OH 31.1 02/08/2020   Lab Results  Component Value Date   WBC 10.5 09/05/2021   HGB 11.6 09/05/2021   HCT 36.0 09/05/2021   MCV 84 09/05/2021   PLT 501 (H) 09/05/2021   Lab Results  Component Value Date   IRON 34 02/14/2021   TIBC 329 02/14/2021   FERRITIN 50 02/14/2021   Attestation Statements:   Reviewed by clinician on day of visit: allergies, medications, problem list, medical history, surgical history, family history, social history, and previous encounter notes.  I, Water quality scientist, CMA, am acting as transcriptionist for Briscoe Deutscher, DO  I have reviewed the above documentation for accuracy and completeness, and I agree with the above. -  Briscoe Deutscher, DO, MS, FAAFP, DABOM - Family and Bariatric Medicine.

## 2021-11-14 ENCOUNTER — Other Ambulatory Visit (INDEPENDENT_AMBULATORY_CARE_PROVIDER_SITE_OTHER): Payer: Self-pay | Admitting: Family Medicine

## 2021-11-14 ENCOUNTER — Other Ambulatory Visit: Payer: Self-pay | Admitting: Physician Assistant

## 2021-11-14 DIAGNOSIS — G894 Chronic pain syndrome: Secondary | ICD-10-CM

## 2021-11-14 NOTE — H&P (Incomplete)
? ? ?Physical Medicine and Rehabilitation Admission H&P ? ?  ?CC: Functional deficits secondary to cerebellar tumor s/p craniotomy ? ?HPI: Virginia Luna is a 61 year old female who recently presented to [place], [facility] complaining of headaches and tremors.  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  On the day of admission 11/07/2021, the patient was taken to the operating room by Dr. Recardo Evangelist of neurosurgery and underwent left suboccipital craniotomy, resection of cerebellar tumor.  She tolerated the procedure well.  Postoperative MRI performed on 3/22 revealed a small area of diffusion restriction in the left cerebellum.  The patient stated she was feeling better and did not feel any weaker in her arms or legs.  She was able to ambulate to the bathroom with a walker.  She remained hemodynamically stable.  CTA of head and neck performed.  On 3/27, she was complaining of some headaches and pain in her legs.  She was constipated. ? ?She underwent physical therapy and occupational therapy evaluation and treatment.  Prior to surgery, she was independent.  She was requiring minimal to moderate assistance with balance and mobility post-operatively. Poor strength assessment in bilateral upper extremities.  No ataxia. The patient requires inpatient medicine and rehabilitation evaluations and services for ongoing dysfunction secondary to cerebellar tumor s/p craniotomy ? ? ?Limited records from outside facility.  No operative report, imaging reports or detailed postoperative progress notes. ? ? ?Review of Systems  ?Respiratory:  Negative for cough and shortness of breath.   ?Neurological:  Positive for dizziness.  ?Past Medical History:  ?Diagnosis Date  ? Allergies   ? Anemia   ? Arthritis   ? Asthma   ? Back pain   ? Chronic pain   ? Complication of anesthesia   ? woke up during colonoscopy  ? Diabetes ([place])   ? Diabetes mellitus without  complication ([place])   ? Edema, lower extremity   ? Fibroid   ? Fibromyalgia   ? Chronic  ? GERD (gastroesophageal reflux disease)   ? Headache   ? migraines  ? High blood pressure   ? History of hiatal hernia   ? History of stomach ulcers   ? IBS (irritable bowel syndrome)   ? Joint pain   ? Sleep apnea   ? did use cpap-lost 25lb-says she does not need it   NO CPAP  ? Wears contact lenses   ? ?Past Surgical History:  ?Procedure Laterality Date  ? BREAST BIOPSY    ? BREAST EXCISIONAL BIOPSY    ? BREAST LUMPECTOMY WITH RADIOACTIVE SEED LOCALIZATION Bilateral 11/24/2014  ? Procedure: BILATERAL BREAST LUMPECTOMY WITH RADIOACTIVE SEED LOCALIZATION;  Surgeon: [name], MD;  Location: [place];  Service: General;  Laterality: Bilateral;  ? CHOLECYSTECTOMY    ? COLONOSCOPY    ? DILATION AND CURETTAGE OF UTERUS    ? ESOPHAGOGASTRODUODENOSCOPY (EGD) WITH PROPOFOL N/A 07/23/2016  ? Procedure: ESOPHAGOGASTRODUODENOSCOPY (EGD) WITH PROPOFOL;  Surgeon: Laurence Spates, MD;  [facility];  Service: Endoscopy;  Laterality: N/A;  ? ESOPHAGOGASTRODUODENOSCOPY (EGD) WITH PROPOFOL N/A 06/11/2018  ? Procedure: ESOPHAGOGASTRODUODENOSCOPY (EGD) WITH PROPOFOL;  Surgeon: Laurence Spates, MD;  [facility];  Service: Endoscopy;  Laterality: N/A;  ? LUMBAR LAMINECTOMY  2010  ? ORIF WRIST FRACTURE Right 11/24/2020  ? Procedure: OPEN REDUCTION INTERNAL FIXATION RIGHT WRIST FRACTURE;  Surgeon: Renette Butters, MD;  Location: [facility];  Service: Orthopedics;  Laterality: Right;  ? UMBILICAL HERNIA  REPAIR    ? age 5  ? UPPER GI ENDOSCOPY    ? ?Family History  ?Problem Relation Age of Onset  ? Breast cancer Paternal Aunt   ? Breast cancer Paternal Grandmother   ? Obesity Mother   ? Diabetes Father   ? High blood pressure Father   ? Sudden death Father   ? ?Social History:  reports that she has never smoked. She has never used smokeless tobacco. She reports that she does not drink alcohol and does not use  drugs. ?Allergies:  ?Allergies  ?Allergen Reactions  ? Rocephin [Ceftriaxone] Anaphylaxis  ? Morphine And Related Itching and Nausea And Vomiting  ?  Doesn't work  ? Prednisone Itching and Swelling  ? Sulfa Antibiotics Itching and Swelling  ? Amitriptyline Other (See Comments)  ? Penicillin G Sodium Other (See Comments)  ? ?(Not in a hospital admission) ? ? ? ? ?Home: ?  ?  ?Functional History: ?  ? ?Functional Status:  ?Mobility: ?  ?  ?  ?  ? ?ADL: ?  ? ?Cognition: ?  ?  ? ?Physical Exam: ?There were no vitals taken for this visit. ?Physical Exam ?Cardiovascular:  ?   Rate and Rhythm: Normal rate and regular rhythm.  ?Pulmonary:  ?   Effort: Pulmonary effort is normal.  ?   Breath sounds: Normal breath sounds.  ?Musculoskeletal:  ?   Right lower leg: No edema.  ?   Left lower leg: No edema.  ?Skin: ?   Comments: Scalp incision healing without signs of infection (sutures out)  ? ? ?No results found for this or any previous visit (from the past 48 hour(s)). ?No results found. ? ? ? ?There were no vitals taken for this visit. ? ?Medical Problem List and Plan: ?1. 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 -patient may *** shower ? -ELOS/Goals: *** ?2.  Antithrombotics: ?-DVT/anticoagulation:  Mechanical: Sequential compression devices, below knee Bilateral lower extremities ? -antiplatelet therapy: none ?3. Pain Management: Tylenol, Norco, Robaxin 750 mg PRN ?4. Mood: LCSW to evaluate and provide emotional support ? -antipsychotic agents: Seroquel ?5. Neuropsych: This patient is capable of making decisions on her own behalf. ?6. Skin/Wound Care: Routine skin care checks ?7. Fluids/Electrolytes/Nutrition: Routine I's and O's and follow-up chemistries ?8: Cerebellar tumor s/p craniectomy: continue Trileptal 900 mg q HS ? --meclizine prn  ?9: Hypertension: continue amlodipine 5 mg daily ?10: Hyperlipidemia: continue atorvastatin 10 mg daily ?11:  Seasonal allergies: continue Xyzal, Astelin, Flonase prn ?12: Asthma: continue Singulair, Breo Ellipta, Proventil ?13: Fibromyalgia: pain management as above ?14: Migraine headache: Maxalt prn ?15: Insomnia: continue melatonin ?16: Sleep apnea: no records of CPAP/BiPap ?64: DM-2: continue CBG monitoring and carb mod diet ? --continue semaglutide [place] every Sunday ?18: Neurogenic bladder: no records ?19: vitamin D deficiency: no supplements. Check levels ?*** ? ?Barbie Banner, PA-C ?11/14/2021  ? ?

## 2021-11-15 ENCOUNTER — Encounter: Payer: Self-pay | Admitting: Physical Medicine and Rehabilitation

## 2021-11-15 ENCOUNTER — Other Ambulatory Visit: Payer: Self-pay

## 2021-11-15 ENCOUNTER — Other Ambulatory Visit (INDEPENDENT_AMBULATORY_CARE_PROVIDER_SITE_OTHER): Payer: Self-pay | Admitting: Family Medicine

## 2021-11-15 ENCOUNTER — Inpatient Hospital Stay (HOSPITAL_COMMUNITY)
Admission: RE | Admit: 2021-11-15 | Discharge: 2021-11-24 | DRG: 949 | Disposition: A | Discharge status: Home / Self Care | Payer: Medicare HMO | Source: Intra-hospital | Attending: Physical Medicine and Rehabilitation | Admitting: Physical Medicine and Rehabilitation

## 2021-11-15 ENCOUNTER — Encounter (HOSPITAL_COMMUNITY): Payer: Self-pay | Admitting: Physical Medicine and Rehabilitation

## 2021-11-15 DIAGNOSIS — Z880 Allergy status to penicillin: Secondary | ICD-10-CM

## 2021-11-15 DIAGNOSIS — I10 Essential (primary) hypertension: Secondary | ICD-10-CM | POA: Diagnosis present

## 2021-11-15 DIAGNOSIS — E119 Type 2 diabetes mellitus without complications: Secondary | ICD-10-CM | POA: Diagnosis not present

## 2021-11-15 DIAGNOSIS — E785 Hyperlipidemia, unspecified: Secondary | ICD-10-CM | POA: Diagnosis present

## 2021-11-15 DIAGNOSIS — G8929 Other chronic pain: Secondary | ICD-10-CM | POA: Diagnosis present

## 2021-11-15 DIAGNOSIS — J45909 Unspecified asthma, uncomplicated: Secondary | ICD-10-CM | POA: Diagnosis not present

## 2021-11-15 DIAGNOSIS — K219 Gastro-esophageal reflux disease without esophagitis: Secondary | ICD-10-CM | POA: Diagnosis present

## 2021-11-15 DIAGNOSIS — Z48811 Encounter for surgical aftercare following surgery on the nervous system: Secondary | ICD-10-CM | POA: Diagnosis not present

## 2021-11-15 DIAGNOSIS — G4700 Insomnia, unspecified: Secondary | ICD-10-CM | POA: Diagnosis present

## 2021-11-15 DIAGNOSIS — B9620 Unspecified Escherichia coli [E. coli] as the cause of diseases classified elsewhere: Secondary | ICD-10-CM | POA: Diagnosis not present

## 2021-11-15 DIAGNOSIS — E559 Vitamin D deficiency, unspecified: Secondary | ICD-10-CM | POA: Diagnosis not present

## 2021-11-15 DIAGNOSIS — Z882 Allergy status to sulfonamides status: Secondary | ICD-10-CM

## 2021-11-15 DIAGNOSIS — G43909 Migraine, unspecified, not intractable, without status migrainosus: Secondary | ICD-10-CM | POA: Diagnosis present

## 2021-11-15 DIAGNOSIS — Z881 Allergy status to other antibiotic agents status: Secondary | ICD-10-CM

## 2021-11-15 DIAGNOSIS — M79605 Pain in left leg: Secondary | ICD-10-CM | POA: Diagnosis not present

## 2021-11-15 DIAGNOSIS — Z23 Encounter for immunization: Secondary | ICD-10-CM | POA: Diagnosis not present

## 2021-11-15 DIAGNOSIS — R251 Tremor, unspecified: Secondary | ICD-10-CM | POA: Diagnosis not present

## 2021-11-15 DIAGNOSIS — Z885 Allergy status to narcotic agent status: Secondary | ICD-10-CM | POA: Diagnosis not present

## 2021-11-15 DIAGNOSIS — N319 Neuromuscular dysfunction of bladder, unspecified: Secondary | ICD-10-CM | POA: Diagnosis present

## 2021-11-15 DIAGNOSIS — M797 Fibromyalgia: Secondary | ICD-10-CM | POA: Diagnosis present

## 2021-11-15 DIAGNOSIS — F419 Anxiety disorder, unspecified: Secondary | ICD-10-CM

## 2021-11-15 DIAGNOSIS — Z7951 Long term (current) use of inhaled steroids: Secondary | ICD-10-CM

## 2021-11-15 DIAGNOSIS — Z803 Family history of malignant neoplasm of breast: Secondary | ICD-10-CM

## 2021-11-15 DIAGNOSIS — Z79899 Other long term (current) drug therapy: Secondary | ICD-10-CM | POA: Diagnosis not present

## 2021-11-15 DIAGNOSIS — N390 Urinary tract infection, site not specified: Secondary | ICD-10-CM | POA: Diagnosis not present

## 2021-11-15 DIAGNOSIS — K5900 Constipation, unspecified: Secondary | ICD-10-CM | POA: Diagnosis not present

## 2021-11-15 DIAGNOSIS — Z6841 Body Mass Index (BMI) 40.0 and over, adult: Secondary | ICD-10-CM

## 2021-11-15 DIAGNOSIS — G894 Chronic pain syndrome: Secondary | ICD-10-CM

## 2021-11-15 DIAGNOSIS — F5105 Insomnia due to other mental disorder: Secondary | ICD-10-CM

## 2021-11-15 DIAGNOSIS — G9389 Other specified disorders of brain: Secondary | ICD-10-CM

## 2021-11-15 DIAGNOSIS — Z833 Family history of diabetes mellitus: Secondary | ICD-10-CM | POA: Diagnosis not present

## 2021-11-15 DIAGNOSIS — R110 Nausea: Secondary | ICD-10-CM

## 2021-11-15 DIAGNOSIS — G4730 Sleep apnea, unspecified: Secondary | ICD-10-CM | POA: Diagnosis present

## 2021-11-15 DIAGNOSIS — R269 Unspecified abnormalities of gait and mobility: Secondary | ICD-10-CM | POA: Diagnosis present

## 2021-11-15 DIAGNOSIS — D496 Neoplasm of unspecified behavior of brain: Secondary | ICD-10-CM | POA: Diagnosis not present

## 2021-11-15 DIAGNOSIS — J301 Allergic rhinitis due to pollen: Secondary | ICD-10-CM

## 2021-11-15 DIAGNOSIS — R4189 Other symptoms and signs involving cognitive functions and awareness: Secondary | ICD-10-CM | POA: Diagnosis present

## 2021-11-15 DIAGNOSIS — M79604 Pain in right leg: Secondary | ICD-10-CM | POA: Diagnosis not present

## 2021-11-15 LAB — GLUCOSE, CAPILLARY
Glucose-Capillary: 152 mg/dL — ABNORMAL HIGH (ref 70–99)
Glucose-Capillary: 147 mg/dL — ABNORMAL HIGH (ref 70–99)

## 2021-11-15 MED ORDER — BISACODYL 10 MG RE SUPP: 10.0000 mg | Freq: Every day | RECTAL | Status: DC | PRN

## 2021-11-15 MED ORDER — METHOCARBAMOL 750 MG PO TABS
750.0000 mg | ORAL_TABLET | Freq: Three times a day (TID) | ORAL | Status: DC | PRN
  Administered 2021-11-16 – 2021-11-23 (×16): 750 mg via ORAL
  Filled 2021-11-15 (×16): qty 1

## 2021-11-15 MED ORDER — OXYCODONE HCL 5 MG PO TABS
5.0000 mg | ORAL_TABLET | ORAL | Status: DC | PRN
  Administered 2021-11-15 – 2021-11-16 (×3): 10 mg via ORAL
  Administered 2021-11-17: 5 mg via ORAL
  Administered 2021-11-17 – 2021-11-20 (×11): 10 mg via ORAL
  Administered 2021-11-20: 5 mg via ORAL
  Administered 2021-11-21 – 2021-11-24 (×9): 10 mg via ORAL
  Filled 2021-11-15 (×23): qty 2
  Filled 2021-11-15: qty 1
  Filled 2021-11-15: qty 2

## 2021-11-15 MED ORDER — PNEUMOCOCCAL 20-VAL CONJ VACC 0.5 ML IM SUSY
0.5000 mL | PREFILLED_SYRINGE | INTRAMUSCULAR | Status: AC
  Administered 2021-11-16: 0.5 mL via INTRAMUSCULAR
  Filled 2021-11-15: qty 0.5

## 2021-11-15 MED ORDER — GUAIFENESIN-DM 100-10 MG/5ML PO SYRP: 5.0000 mL | ORAL_SOLUTION | Freq: Four times a day (QID) | ORAL | Status: DC | PRN

## 2021-11-15 MED ORDER — MELATONIN 5 MG PO TABS
5.0000 mg | ORAL_TABLET | Freq: Every day | ORAL | Status: DC
  Administered 2021-11-15 – 2021-11-23 (×9): 5 mg via ORAL
  Filled 2021-11-15 (×9): qty 1

## 2021-11-15 MED ORDER — ONDANSETRON HCL 4 MG/2ML IJ SOLN
4.0000 mg | Freq: Four times a day (QID) | INTRAMUSCULAR | Status: DC | PRN
  Filled 2021-11-15: qty 2

## 2021-11-15 MED ORDER — ALBUTEROL SULFATE (2.5 MG/3ML) 0.083% IN NEBU
2.5000 mg | INHALATION_SOLUTION | Freq: Four times a day (QID) | RESPIRATORY_TRACT | Status: DC | PRN
  Administered 2021-11-15 – 2021-11-21 (×7): 2.5 mg via RESPIRATORY_TRACT
  Filled 2021-11-15 (×7): qty 3

## 2021-11-15 MED ORDER — ZOLPIDEM TARTRATE 5 MG PO TABS
10.0000 mg | ORAL_TABLET | Freq: Every evening | ORAL | Status: DC | PRN
  Administered 2021-11-15 – 2021-11-23 (×9): 10 mg via ORAL
  Filled 2021-11-15 (×9): qty 2

## 2021-11-15 MED ORDER — MONTELUKAST SODIUM 10 MG PO TABS
10.0000 mg | ORAL_TABLET | Freq: Every day | ORAL | Status: DC
  Administered 2021-11-15 – 2021-11-23 (×9): 10 mg via ORAL
  Filled 2021-11-15 (×9): qty 1

## 2021-11-15 MED ORDER — PANTOPRAZOLE SODIUM 40 MG PO TBEC
40.0000 mg | DELAYED_RELEASE_TABLET | Freq: Every day | ORAL | Status: DC
Start: 2021-11-16 — End: 2021-11-24
  Administered 2021-11-16 – 2021-11-24 (×9): 40 mg via ORAL
  Filled 2021-11-15 (×9): qty 1

## 2021-11-15 MED ORDER — ATORVASTATIN CALCIUM 10 MG PO TABS
10.0000 mg | ORAL_TABLET | Freq: Every day | ORAL | Status: DC
  Administered 2021-11-15 – 2021-11-23 (×9): 10 mg via ORAL
  Filled 2021-11-15 (×9): qty 1

## 2021-11-15 MED ORDER — ACETAMINOPHEN 325 MG PO TABS
325.0000 mg | ORAL_TABLET | ORAL | Status: DC | PRN
  Administered 2021-11-18 – 2021-11-19 (×5): 650 mg via ORAL
  Filled 2021-11-15 (×6): qty 2

## 2021-11-15 MED ORDER — MECLIZINE HCL 25 MG PO TABS
25.0000 mg | ORAL_TABLET | Freq: Three times a day (TID) | ORAL | Status: DC | PRN
  Administered 2021-11-16 – 2021-11-23 (×7): 25 mg via ORAL
  Filled 2021-11-15 (×7): qty 1

## 2021-11-15 MED ORDER — DEXAMETHASONE 4 MG PO TABS
4.0000 mg | ORAL_TABLET | Freq: Three times a day (TID) | ORAL | Status: DC
  Administered 2021-11-15 – 2021-11-17 (×7): 4 mg via ORAL
  Filled 2021-11-15 (×7): qty 1

## 2021-11-15 MED ORDER — INSULIN ASPART 100 UNIT/ML IJ SOLN
0.0000 [IU] | Freq: Three times a day (TID) | INTRAMUSCULAR | Status: DC
  Administered 2021-11-15 – 2021-11-23 (×4): 1 [IU] via SUBCUTANEOUS

## 2021-11-15 MED ORDER — SENNOSIDES-DOCUSATE SODIUM 8.6-50 MG PO TABS
2.0000 | ORAL_TABLET | Freq: Every day | ORAL | Status: DC
  Administered 2021-11-15 – 2021-11-23 (×9): 2 via ORAL
  Filled 2021-11-15 (×9): qty 2

## 2021-11-15 MED ORDER — FLEET ENEMA 7-19 GM/118ML RE ENEM: 1.0000 | ENEMA | Freq: Once | RECTAL | Status: DC | PRN

## 2021-11-15 MED ORDER — ALUM & MAG HYDROXIDE-SIMETH 200-200-20 MG/5ML PO SUSP: 30.0000 mL | ORAL | Status: DC | PRN

## 2021-11-15 MED ORDER — ONDANSETRON HCL 4 MG PO TABS
4.0000 mg | ORAL_TABLET | Freq: Four times a day (QID) | ORAL | Status: DC | PRN
  Administered 2021-11-19 – 2021-11-20 (×2): 4 mg via ORAL
  Filled 2021-11-15 (×4): qty 1

## 2021-11-15 MED ORDER — POLYETHYLENE GLYCOL 3350 17 G PO PACK
17.0000 g | PACK | Freq: Every day | ORAL | Status: DC | PRN
  Administered 2021-11-20: 17 g via ORAL
  Filled 2021-11-15: qty 1

## 2021-11-15 MED ORDER — OXCARBAZEPINE 300 MG PO TABS
900.0000 mg | ORAL_TABLET | Freq: Every day | ORAL | Status: DC
  Administered 2021-11-15 – 2021-11-23 (×9): 900 mg via ORAL
  Filled 2021-11-15 (×10): qty 3

## 2021-11-15 MED ORDER — QUETIAPINE FUMARATE 50 MG PO TABS
300.0000 mg | ORAL_TABLET | Freq: Every day | ORAL | Status: DC
  Administered 2021-11-15 – 2021-11-23 (×9): 300 mg via ORAL
  Filled 2021-11-15 (×9): qty 6

## 2021-11-15 MED ORDER — AMLODIPINE BESYLATE 5 MG PO TABS
5.0000 mg | ORAL_TABLET | Freq: Every day | ORAL | Status: DC
  Administered 2021-11-16 – 2021-11-24 (×9): 5 mg via ORAL
  Filled 2021-11-15 (×9): qty 1

## 2021-11-15 NOTE — H&P (Signed)
? ? ?Physical Medicine and Rehabilitation Admission H&P ? ?  ?CC: Functional deficits secondary to cerebellar tumor s/p craniotomy ? ?HPI: Virginia Luna is a 61 year old female who recently presented to [place], [facility] complaining of headaches and tremors.  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  On the day of admission 11/07/2021, the patient was taken to the operating room by Dr. Recardo Evangelist of neurosurgery and underwent left suboccipital craniotomy, resection of cerebellar tumor.  She tolerated the procedure well.  Postoperative MRI performed on 3/22 revealed a small area of diffusion restriction in the left cerebellum.  The patient stated she was feeling better and did not feel any weaker in her arms or legs.  She was able to ambulate to the bathroom with a walker.  She remained hemodynamically stable.  CTA of head and neck performed.  On 3/27, she was complaining of some headaches and pain in her legs.  She was constipated. ? ?She underwent physical therapy and occupational therapy evaluation and treatment.  Prior to surgery, she was independent.  She was requiring minimal to moderate assistance with balance and mobility post-operatively. Poor strength assessment in bilateral upper extremities.  No ataxia. The patient requires inpatient medicine and rehabilitation evaluations and services for ongoing dysfunction secondary to cerebellar tumor s/p craniotomy ? ? ?Limited records from outside facility.  No operative report, imaging reports or detailed postoperative progress notes. Currently complains of dizziness.  ? ? ?Review of Systems  ?Respiratory:  Negative for cough and shortness of breath.   ?Neurological:  Positive for dizziness.  ?Past Medical History:  ?Diagnosis Date  ? Allergies   ? Anemia   ? Arthritis   ? Asthma   ? Back pain   ? Chronic pain   ? Complication of anesthesia   ? woke up during colonoscopy  ? Diabetes  ([place])   ? Diabetes mellitus without complication ([place])   ? Edema, lower extremity   ? Fibroid   ? Fibromyalgia   ? Chronic  ? GERD (gastroesophageal reflux disease)   ? Headache   ? migraines  ? High blood pressure   ? History of hiatal hernia   ? History of stomach ulcers   ? IBS (irritable bowel syndrome)   ? Joint pain   ? Sleep apnea   ? did use cpap-lost 25lb-says she does not need it   NO CPAP  ? Wears contact lenses   ? ?Past Surgical History:  ?Procedure Laterality Date  ? BREAST BIOPSY    ? BREAST EXCISIONAL BIOPSY    ? BREAST LUMPECTOMY WITH RADIOACTIVE SEED LOCALIZATION Bilateral 11/24/2014  ? Procedure: BILATERAL BREAST LUMPECTOMY WITH RADIOACTIVE SEED LOCALIZATION;  Surgeon: [name], MD;  Location: [place];  Service: General;  Laterality: Bilateral;  ? CHOLECYSTECTOMY    ? COLONOSCOPY    ? DILATION AND CURETTAGE OF UTERUS    ? ESOPHAGOGASTRODUODENOSCOPY (EGD) WITH PROPOFOL N/A 07/23/2016  ? Procedure: ESOPHAGOGASTRODUODENOSCOPY (EGD) WITH PROPOFOL;  Surgeon: Laurence Spates, MD;  [facility];  Service: Endoscopy;  Laterality: N/A;  ? ESOPHAGOGASTRODUODENOSCOPY (EGD) WITH PROPOFOL N/A 06/11/2018  ? Procedure: ESOPHAGOGASTRODUODENOSCOPY (EGD) WITH PROPOFOL;  Surgeon: Laurence Spates, MD;  [facility];  Service: Endoscopy;  Laterality: N/A;  ? LUMBAR LAMINECTOMY  2010  ? ORIF WRIST FRACTURE Right 11/24/2020  ? Procedure: OPEN REDUCTION INTERNAL FIXATION RIGHT WRIST FRACTURE;  Surgeon: Renette Butters, MD;  [facility];  Service: Orthopedics;  Laterality:  Right;  ? UMBILICAL HERNIA REPAIR    ? age 54  ? UPPER GI ENDOSCOPY    ? ?Family History  ?Problem Relation Age of Onset  ? Breast cancer Paternal Aunt   ? Breast cancer Paternal Grandmother   ? Obesity Mother   ? Diabetes Father   ? High blood pressure Father   ? Sudden death Father   ? ?Social History:  reports that she has never smoked. She has never used smokeless tobacco. She reports that she does not drink  alcohol and does not use drugs. ?Allergies:  ?Allergies  ?Allergen Reactions  ? Rocephin [Ceftriaxone] Anaphylaxis  ? Morphine And Related Itching and Nausea And Vomiting  ?  Doesn't work  ? Prednisone Itching and Swelling  ? Sulfa Antibiotics Itching and Swelling  ? Amitriptyline Other (See Comments)  ? Penicillin G Sodium Other (See Comments)  ? ?Medications Prior to Admission  ?Medication Sig Dispense Refill  ? albuterol (PROVENTIL HFA;VENTOLIN HFA) 108 (90 BASE) MCG/ACT inhaler Inhale 2 puffs into the lungs every 6 (six) hours as needed for wheezing or shortness of breath.    ? amLODipine (NORVASC) 5 MG tablet Take 5 mg by mouth daily.    ? atorvastatin (LIPITOR) 10 MG tablet Take 10 mg by mouth at bedtime.    ? azelastine (ASTELIN) 0.1 % nasal spray Place 1 spray into both nostrils 2 (two) times daily as needed for rhinitis.    ? cyclobenzaprine (FLEXERIL) 10 MG tablet Take 20 mg by mouth at bedtime.    ? diphenhydrAMINE HCl, Sleep, 25 MG CAPS Take 50 tablets by mouth at bedtime as needed (sleep).    ? EPINEPHrine 0.3 mg/0.3 mL IJ SOAJ injection Inject 0.3 mg into the muscle as needed for anaphylaxis.    ? fluticasone (FLONASE) 50 MCG/ACT nasal spray Place 1 spray into both nostrils daily as needed for allergies or rhinitis.    ? fluticasone furoate-vilanterol (BREO ELLIPTA) 200-25 MCG/INH AEPB Inhale 1 puff daily into the lungs.    ? HYDROcodone-acetaminophen (NORCO/VICODIN) 5-325 MG tablet Take 1 tablet by mouth 2 (two) times daily as needed for moderate pain.   0  ? levocetirizine (XYZAL) 5 MG tablet Take 5 mg by mouth every evening.    ? linaclotide (LINZESS) 72 MCG capsule Take 1 capsule (72 mcg total) by mouth daily as needed (constipation). 90 capsule 0  ? meclizine (ANTIVERT) 25 MG tablet TAKE ONE TABLET BY MOUTH EVERY 8 HOURS AS NEEDED FOR DIZZINESS (Patient taking differently: Take 25 mg by mouth every 8 (eight) hours as needed for dizziness.) 30 tablet 0  ? Melatonin 10 MG CAPS Take 10 mg by mouth at  bedtime as needed (sleep).    ? methocarbamol (ROBAXIN) 500 MG tablet TAKE ONE TABLET BY MOUTH every EIGHT hours AS NEEDED FOR MUSCLE SPASMS (Patient taking differently: Take 750 mg by mouth 3 (three) times daily as needed for muscle spasms.) 20 tablet 0  ? montelukast (SINGULAIR) 10 MG tablet Take 1 tablet (10 mg total) by mouth at bedtime. 30 tablet 11  ? ondansetron (ZOFRAN ODT) 4 MG disintegrating tablet Take 1 tablet (4 mg total) by mouth every 6 (six) hours as needed for nausea or vomiting. 20 tablet 0  ? Oxcarbazepine (TRILEPTAL) 300 MG tablet Take 1 tablet (300 mg total) by mouth at bedtime. X 1 week, then 600 mg nightly x1 week; can increase to 900 mg nightly- -for nerve pain (Patient taking differently: Take 900 mg by mouth at bedtime.) 90  tablet 5  ? oxyCODONE (OXY IR/ROXICODONE) 5 MG immediate release tablet Take 5 mg by mouth every 4 (four) hours as needed for moderate pain.    ? pantoprazole (PROTONIX) 40 MG tablet Take 40 mg by mouth 2 (two) times daily.  1  ? QUEtiapine (SEROQUEL) 300 MG tablet Take 1 tablet (300 mg total) by mouth at bedtime. 30 tablet 11  ? rizatriptan (MAXALT-MLT) 10 MG disintegrating tablet TAKE ONE TABLET BY MOUTH AT earliest ONSET of migraine. MAY REPEAT in TWO hours IF needed. max of TWO tabs in 24 hours 10 tablet 1  ? Semaglutide,0.25 or 0.'5MG'$ /DOS, (OZEMPIC, 0.25 OR 0.5 MG/DOSE,) 2 MG/1.5ML SOPN Inject 0.5 mg into the skin once a week. (Patient taking differently: Inject 0.5 mg into the skin every Sunday.) 1.5 mL 0  ? Vitamin D, Ergocalciferol, (DRISDOL) 1.25 MG (50000 UNIT) CAPS capsule TAKE ONE CAPSULE BY MOUTH EVERY 7 DAYS (Patient taking differently: Take 50,000 Units by mouth every Sunday.) 4 capsule 0  ? zolpidem (AMBIEN) 10 MG tablet Take 1 tablet (10 mg total) by mouth at bedtime. (Patient taking differently: Take 10 mg by mouth at bedtime as needed for sleep.) 30 tablet 5  ? dexamethasone (DECADRON) 4 MG tablet Take 2-4 mg by mouth See admin instructions. Take '4mg'$  by  mouth twice daily for 2 days, then take '2mg'$  daily for two days, then stop.    ? gabapentin (NEURONTIN) 300 MG capsule Take 1 capsule (300 mg total) by mouth 2 (two) times daily with breakfast and lu

## 2021-11-15 NOTE — Progress Notes (Signed)
PMR Admission Coordinator Pre-Admission Assessment ?  ?Patient: Virginia Luna is an 61 y.o., female ?MRN: [id number] ?DOB: 1961/08/19 ?Height: 5\' 1"  (1.549 m) ?Weight: 124.5 kg ?  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 She requests team conference notes when available ?Benefits:  Phone #: [phone]     Name: online [email or web address] ?Eff. Date: 08/20/21     Deduct: $0      Out of Pocket Max: $3400 (met [id number])      Life Max: N/A ?CIR: $295 days 1-6 max $1770/admission      SNF: $0 days 1-20; $196 days 21-100 ?Outpatient: [facility]     Co-Pay: $20/visit ?Home Health: 100%      Co-Pay: none ?DME: 80%     Co-Pay: 20% ?Providers: in network ? ?SECONDARY:       Policy#:       Phone#:  ?  ?Financial Counselor:       Phone#:  ?  ?The ?Data Collection Information Summary? for patients in Inpatient Rehabilitation Facilities with attached ?Privacy Act [place] Records? was provided and verbally reviewed with: Patient ?  ?Emergency Contact Information ?Contact Information   ?  ?  Name Relation Home Work Mobile  ?  Klepper,avis Other     [phone]  ?  Scales,Herbert Significant other [phone]   [phone]  ?  Bradly Bienenstock     [phone]  ?  ?   ?  ?  ?Current Medical History  ?Patient Admitting Diagnosis: L superior cerebellar vermis tumor; L suboccipital craniotomy for resection of tumor ?  ?History of Present Illness: A 61 yo femal who recently presented with headaches and tremors.  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  Was admitted to  [facility] on 11/07/[facility].  Underwent Left suboccipital craniotomy for resection of tumor by Dr. Herschel Senegal on 11/07/21.  PT/OT evaluations were completed with recommendations for acute inpatient rehab admission. ?  ?  ?Patient's medical record from [facility] in [place], [place] has been reviewed by the rehabilitation admission coordinator and physician. ?  ?Past Medical History  ?    ?Past Medical History:  ?Diagnosis Date  ? Allergies    ? Anemia    ? Arthritis    ? Asthma    ? Back pain    ? Chronic pain    ? Complication of anesthesia    ?  woke up during colonoscopy  ? Diabetes ([place])    ? Diabetes mellitus without complication ([place])    ? Edema, lower extremity    ? Fibroid    ? Fibromyalgia    ?  Chronic  ? GERD (gastroesophageal reflux disease)    ? Headache    ?  migraines  ? High blood pressure    ? History of hiatal hernia    ? History of stomach ulcers    ?  IBS (irritable bowel syndrome)    ? Joint pain    ? Sleep apnea    ?  did use cpap-lost 25lb-says she does not need it   NO CPAP  ? Wears contact lenses    ?  ?  ?Has the patient had major surgery during 100 days prior to admission? Yes ?  ?Family History   ?family history includes Breast cancer in her paternal aunt and paternal grandmother; Diabetes in her father; High blood pressure in her father; Obesity in her mother; Sudden death in her father. ?  ?Current Medications ?  ?Current Outpatient Medications:  ?  albuterol (PROVENTIL HFA;VENTOLIN HFA) 108 (90 BASE) MCG/ACT inhaler, Inhale 2 puffs into the lungs every 6 (six) hours as needed for wheezing or shortness of breath. , Disp: , Rfl:  ?  amLODipine (NORVASC) 5 MG tablet, Take 5 mg by mouth daily., Disp: , Rfl:  ?  atorvastatin (LIPITOR) 10 MG tablet, Take 10 mg by mouth at bedtime., Disp: , Rfl:  ?  azelastine (ASTELIN) 0.1 % nasal spray, Place 1 spray into both nostrils 2 (two) times daily as needed for allergies., Disp: , Rfl: 2 ?  blood glucose meter  kit and supplies, Dispense based on patient and insurance preference. Use up to four times daily as directed. (FOR ICD-10 E10.9, E11.9)., Disp: 1 each, Rfl: 0 ?  cyclobenzaprine (FLEXERIL) 10 MG tablet, as needed., Disp: , Rfl:  ?  diclofenac (VOLTAREN) 75 MG EC tablet, , Disp: , Rfl:  ?  EPINEPHrine 0.3 mg/0.3 mL IJ SOAJ injection, Inject 0.3 mg into the muscle as needed for anaphylaxis., Disp: , Rfl:  ?  fluticasone (FLONASE) 50 MCG/ACT nasal spray, Place 1 spray into both nostrils daily as needed for allergies., Disp: , Rfl:  ?  fluticasone furoate-vilanterol (BREO ELLIPTA) 200-25 MCG/INH AEPB, Inhale 1 puff daily into the lungs., Disp: , Rfl:  ?  gabapentin (NEURONTIN) 300 MG capsule, Take 1 capsule (300 mg total) by mouth 2 (two) times daily with breakfast and lunch., Disp: 60 capsule, Rfl: 0 ?  HYDROcodone-acetaminophen (NORCO/VICODIN) 5-325 MG tablet, Take 1 tablet by mouth 2 (two) times daily as needed for moderate pain. , Disp: , Rfl: 0 ?  Insulin Pen Needle (BD PEN NEEDLE NANO 2ND GEN) 32G X 4 MM MISC, 1 Package by Does not apply route daily., Disp: 100 each, Rfl: 0 ?  levocetirizine (XYZAL) 5 MG tablet, Take 1 tablet (5 mg total) by mouth every evening., Disp: 90 tablet, Rfl: 0 ?  linaclotide (LINZESS) 72 MCG capsule, Take 1 capsule (72 mcg total) by mouth daily as needed (constipation)., Disp: 90 capsule, Rfl: 0 ?  meclizine (ANTIVERT) 25 MG tablet, TAKE ONE TABLET BY MOUTH EVERY 8 HOURS AS NEEDED FOR DIZZINESS, Disp: 30 tablet, Rfl: 0 ?  methocarbamol (ROBAXIN) 500 MG tablet, TAKE ONE TABLET BY MOUTH every EIGHT hours AS NEEDED FOR MUSCLE SPASMS, Disp: 20 tablet, Rfl: 0 ?  montelukast (SINGULAIR) 10 MG tablet, Take 1 tablet (10 mg total) by mouth at bedtime., Disp: 30 tablet, Rfl: 11 ?  OnabotulinumtoxinA (BOTOX IJ), Inject as directed every 3 (three) months., Disp: , Rfl:  ?  ondansetron (ZOFRAN ODT) 4 MG disintegrating tablet, Take 1 tablet (4 mg total) by mouth every 6 (six) hours as needed for  nausea or vomiting., Disp: 20 tablet, Rfl: 0 ?  Oxcarbazepine (TRILEPTAL) 300 MG tablet, Take 1 tablet (300 mg total) by mouth at bedtime. X 1 week, then 600 mg nightly x1  week; can increase to 900 mg nightly- -for nerve pain, Disp: 90 tablet, Rfl: 5 ?  oxyCODONE (OXY IR/ROXICODONE) 5 MG immediate release tablet, as needed., Disp: , Rfl:  ?  pantoprazole (PROTONIX) 40 MG tablet, Take 40 mg by mouth 2 (two) times daily., Disp: , Rfl: 1 ?  QUEtiapine (SEROQUEL) 300 MG tablet, Take 1 tablet (300 mg total) by mouth at bedtime., Disp: 30 tablet, Rfl: 11 ?  rizatriptan (MAXALT-MLT) 10 MG disintegrating tablet, TAKE ONE TABLET BY MOUTH AT earliest ONSET of migraine. MAY REPEAT in TWO hours IF needed. max of TWO tabs in 24 hours, Disp: 10 tablet, Rfl: 1 ?  Semaglutide,0.25 or 0.5MG/DOS, (OZEMPIC, 0.25 OR 0.5 MG/DOSE,) 2 MG/1.5ML SOPN, Inject 0.5 mg into the skin once a week., Disp: 1.5 mL, Rfl: 0 ?  Vitamin D, Ergocalciferol, (DRISDOL) 1.25 MG (50000 UNIT) CAPS capsule, TAKE ONE CAPSULE BY MOUTH EVERY 7 DAYS, Disp: 4 capsule, Rfl: 0 ?  zolpidem (AMBIEN) 10 MG tablet, Take 1 tablet (10 mg total) by mouth at bedtime., Disp: 30 tablet, Rfl: 5 ?  ?Patients Current Diet: Diet Soft and Bite sized (IDDSI 6) Thin liquids  ?  ?Precautions / Restrictions ?Precautions: Fall ?  ?  ?Has the patient had 2 or more falls or a fall with injury in the past year? Yes ?  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 ?  ?  ?Prior Functional Level ?Self Care: Did the patient need help bathing, dressing, using the toilet or eating? Independent ?  ?Indoor Mobility: Did the patient need assistance with walking from room to room (with or without device)? Independent ?  ?Stairs: Did the patient need assistance with internal or external stairs (with or without device)? Independent ?  ?Functional Cognition: Did the patient need help planning regular tasks such as shopping or remembering to take medications?  Independent ?  ?Patient Information ?Are you of Hispanic, Latino/a,or Spanish origin?: A. No, not of Hispanic, Latino/a, or Spanish origin ?What is your race?: B. Black or African American ?Do you need or want a

## 2021-11-15 NOTE — Progress Notes (Signed)
INPATIENT REHABILITATION ADMISSION NOTE ? ? ?Arrival Method: EMS ? ?   ?Mental Orientation:alert ? ? ?Assessment:done ? ? ?Skin:done ? ? ?IV'S:none ? ? ?Pain:none ? ? ?Tubes and Drains:none ? ? ?Safety Measures:done ? ? ?Vital Signs:done ? ? ?Height and Weight:done ? ? ?Rehab Orientation: ?done ? ?Family: ?none ? ? ?Notes:  ?

## 2021-11-15 NOTE — Progress Notes (Addendum)
Inpatient Rehabilitation Admission Medication Review by a Pharmacist ? ?A complete drug regimen review was completed for this patient to identify any potential clinically significant medication issues. ? ?High Risk Drug Classes Is patient taking? Indication by Medication  ?Antipsychotic Yes Seroquel for mood control  ?Anticoagulant No   ?Antibiotic No   ?Opioid Yes Oxycodone for pain  ?Antiplatelet No   ?Hypoglycemics/insulin Yes SSI for DM  ?Vasoactive Medication Yes Amlodipine for BP  ?Chemotherapy No   ?Other Yes Atorvastatin for HLD ? ?Decadron s/p craniectomy ? ?Singulair for asthma ? ?Trileptal for seizure prophylaxis ? ?Protonix for Jerrye Bushy  ? ? ? ?Type of Medication Issue Identified Description of Issue Recommendation(s)  ?Drug Interaction(s) (clinically significant) ?    ?Duplicate Therapy ?    ?Allergy ?    ?No Medication Administration End Date ?    ?Incorrect Dose ?    ?Additional Drug Therapy Needed ?    ?Significant med changes from prior encounter (inform family/care partners about these prior to discharge).    ?Other ? Breo, allergy medications, and other prn medications can be resumed at discharge from rehab   ? ? ?Clinically significant medication issues were identified that warrant physician communication and completion of prescribed/recommended actions by midnight of the next day:  No ? ?Pharmacist comments: None ? ?Time spent performing this drug regimen review (minutes):  20 minutes ? ? ?Tad Moore ?11/15/2021 12:29 PM ?

## 2021-11-16 ENCOUNTER — Ambulatory Visit (INDEPENDENT_AMBULATORY_CARE_PROVIDER_SITE_OTHER): Payer: Medicare HMO | Admitting: Family Medicine

## 2021-11-16 DIAGNOSIS — D496 Neoplasm of unspecified behavior of brain: Secondary | ICD-10-CM | POA: Diagnosis not present

## 2021-11-16 LAB — COMPREHENSIVE METABOLIC PANEL
ALT: 20 U/L (ref 0–44)
AST: 9 U/L — ABNORMAL LOW (ref 15–41)
Albumin: 3.2 g/dL — ABNORMAL LOW (ref 3.5–5.0)
Alkaline Phosphatase: 111 U/L (ref 38–126)
Anion gap: 8 (ref 5–15)
BUN: 13 mg/dL (ref 6–20)
CO2: 29 mmol/L (ref 22–32)
Calcium: 8.9 mg/dL (ref 8.9–10.3)
Chloride: 102 mmol/L (ref 98–111)
Creatinine, Ser: 0.49 mg/dL (ref 0.44–1.00)
GFR, Estimated: >60 mL/min (ref >60)
Glucose, Bld: 141 mg/dL — ABNORMAL HIGH (ref 70–99)
Potassium: 3.9 mmol/L (ref 3.5–5.1)
Sodium: 139 mmol/L (ref 135–145)
Total Bilirubin: 0.4 mg/dL (ref 0.3–1.2)
Total Protein: 6.4 g/dL — ABNORMAL LOW (ref 6.5–8.1)

## 2021-11-16 LAB — GLUCOSE, CAPILLARY
Glucose-Capillary: 148 mg/dL — ABNORMAL HIGH (ref 70–99)
Glucose-Capillary: 113 mg/dL — ABNORMAL HIGH (ref 70–99)
Glucose-Capillary: 110 mg/dL — ABNORMAL HIGH (ref 70–99)
Glucose-Capillary: 152 mg/dL — ABNORMAL HIGH (ref 70–99)

## 2021-11-16 LAB — CBC WITH DIFFERENTIAL/PLATELET
Abs Immature Granulocytes: 0.42 10*3/uL — ABNORMAL HIGH (ref 0.00–0.07)
Basophils Absolute: 0 10*3/uL (ref 0.0–0.1)
Basophils Relative: 0 %
Eosinophils Absolute: 0 10*3/uL (ref 0.0–0.5)
Eosinophils Relative: 0 %
HCT: 33.4 % — ABNORMAL LOW (ref 36.0–46.0)
Hemoglobin: 10.7 g/dL — ABNORMAL LOW (ref 12.0–15.0)
Immature Granulocytes: 2 %
Lymphocytes Relative: 8 %
Lymphs Abs: 1.6 10*3/uL (ref 0.7–4.0)
MCH: 26.8 pg (ref 26.0–34.0)
MCHC: 32 g/dL (ref 30.0–36.0)
MCV: 83.5 fL (ref 80.0–100.0)
Monocytes Absolute: 0.6 10*3/uL (ref 0.1–1.0)
Monocytes Relative: 3 %
Neutro Abs: 16 10*3/uL — ABNORMAL HIGH (ref 1.7–7.7)
Neutrophils Relative %: 87 %
Platelets: 513 10*3/uL — ABNORMAL HIGH (ref 150–400)
RBC: 4 MIL/uL (ref 3.87–5.11)
RDW: 16.7 % — ABNORMAL HIGH (ref 11.5–15.5)
WBC: 18.6 10*3/uL — ABNORMAL HIGH (ref 4.0–10.5)
nRBC: 0 % (ref 0.0–0.2)

## 2021-11-16 NOTE — Discharge Summary (Signed)
Physician Discharge Summary  ?Patient ID: ?Virginia Luna ?MRN: [id number] ?DOB/AGE: October 27, 1960 61 y.o. ? ?Admit date: 11/15/2021 ?Discharge date: 11/24/2021 ? ?Discharge Diagnoses:  ?Principal Problem: ?  Cerebellar tumor ([place]) ?Active Problems: ?  Cerebellar mass ?S/p craniectomy ?Hypertension ?Hyperlipidemia ?Seasonal allergies ?Asthma ?Fibromyalgia ?Insomnia ?Sleep apnea ?DM-2 ?Vitamin D deficiency ?Leukocytosis ?Chronic pain ?Myoclonus ? ?Discharged Condition: stable ? ?Significant Diagnostic Studies: ?VAS [name] LOWER EXTREMITY VENOUS (DVT) ? ?Result Date: 11/21/2021 ? Lower Venous DVT Study Patient Name:  Virginia Luna  Date of Exam:   11/21/2021 Medical Rec #: [id number]          Accession #:    [id number] Date of Birth: [birth year]           Patient Gender: F Patient Age:   39 years Exam Location:  [facility] Procedure:      VAS [name] LOWER EXTREMITY VENOUS (DVT) Referring Phys: MEGAN LOVORN --------------------------------------------------------------------------------  Indications: Pain.  Comparison Study: no prior Performing Technologist: Archie Patten RVS  Examination Guidelines: A complete evaluation includes B-mode imaging, spectral Doppler, color Doppler, and power Doppler as needed of all accessible portions of each vessel. Bilateral testing is considered an integral part of a complete examination. Limited examinations for reoccurring indications may be performed as noted. The reflux portion of the exam is performed with the patient in reverse Trendelenburg.  +---------+---------------+---------+-----------+----------+--------------+ RIGHT    CompressibilityPhasicitySpontaneityPropertiesThrombus Aging +---------+---------------+---------+-----------+----------+--------------+ CFV      Full           Yes      Yes                                 +---------+---------------+---------+-----------+----------+--------------+ SFJ      Full                                                         +---------+---------------+---------+-----------+----------+--------------+ FV Prox  Full                                                        +---------+---------------+---------+-----------+----------+--------------+ FV Mid   Full                                                        +---------+---------------+---------+-----------+----------+--------------+ FV DistalFull                                                        +---------+---------------+---------+-----------+----------+--------------+ PFV      Full                                                        +---------+---------------+---------+-----------+----------+--------------+  POP      Full           Yes      Yes                                 +---------+---------------+---------+-----------+----------+--------------+ PTV      Full                                                        +---------+---------------+---------+-----------+----------+--------------+ PERO     Full                                                        +---------+---------------+---------+-----------+----------+--------------+   +---------+---------------+---------+-----------+----------+--------------+ LEFT     CompressibilityPhasicitySpontaneityPropertiesThrombus Aging +---------+---------------+---------+-----------+----------+--------------+ CFV      Full           Yes      Yes                                 +---------+---------------+---------+-----------+----------+--------------+ SFJ      Full                                                        +---------+---------------+---------+-----------+----------+--------------+ FV Prox  Full                                                        +---------+---------------+---------+-----------+----------+--------------+ FV Mid   Full                                                         +---------+---------------+---------+-----------+----------+--------------+ FV DistalFull                                                        +---------+---------------+---------+-----------+----------+--------------+ PFV      Full                                                        +---------+---------------+---------+-----------+----------+--------------+ POP      Full           Yes      Yes                                 +---------+---------------+---------+-----------+----------+--------------+  PTV      Full                                                        +---------+---------------+---------+-----------+----------+--------------+ PERO     Full                                                        +---------+---------------+---------+-----------+----------+--------------+     Summary: BILATERAL: - No evidence of deep vein thrombosis seen in the lower extremities, bilaterally. -No evidence of popliteal cyst, bilaterally.   *See table(s) above for measurements and observations. Electronically signed by Deitra Mayo MD on 11/21/2021 at 1:20:55 PM.    Final    ? ?Labs:  ?Basic Metabolic Panel: ?Recent Labs  ?Lab 11/20/21 ?0622 11/22/21 ?9735 11/24/21 ?3299  ?NA 142 140 144  ?K 3.0* 4.0 3.9  ?CL 104 104 108  ?CO2 '28 27 28  '$ ?GLUCOSE 147* 107* 118*  ?BUN '16 19 13  '$ ?CREATININE 0.72 0.63 0.57  ?CALCIUM 8.6* 8.9 8.9  ? ? ?CBC: ?Recent Labs  ?Lab 11/20/21 ?0622 11/22/21 ?2426 11/24/21 ?8341  ?WBC 13.9* 15.1* 9.4  ?NEUTROABS 8.5*  --  6.1  ?HGB 9.2* 10.0* 10.2*  ?HCT 29.0* 31.7* 32.4*  ?MCV 85.5 85.7 84.8  ?PLT 413* 388 283  ? ? ?CBG: ?Recent Labs  ?Lab 11/23/21 ?9622 11/23/21 ?1218 11/23/21 ?1624 11/23/21 ?2116 11/24/21 ?0636  ?GLUCAP 99 121* 152* 178* 118*  ? ? ?Brief HPI:   Virginia Luna is a 61 y.o. female who presented to [facility] with left cerebellar tumor. She underwent suboccipital craniotomy on 11/07/2021 by Dr. Herschel Senegal. History of fibromyalgia and sees  Dr. Dagoberto Ligas as outpatient. ? ? ?Hospital Course: Virginia Luna was admitted to rehab 11/15/2021 for inpatient therapies to consist of PT, ST and OT at least three hours five days a week. Past admission physiatrist, therapy team and rehab RN have worked together to provide customized collaborative inpatient rehab. Follow-up labs with elevated WBCs likle secondary to dexamethasone and no signs of infection. Chronic pain stable. Post-op headache is mild. Exhibited shaking on 3/31 and myoclonus 4/3. Neurology consulted. LE venous duplex ordered for new LLE pain. Laboratory work-up for anemia and rec: CPAP for her sleep apnea which she has not been using due to surgical incision and associated pain and discomfort.Trying CPAP now. WBCs up but initially downward trend with taper of steroids. Up to 15.1 from 13.9 on 4/5. UA and urine culture obtained. UA positive for leukocytes, nitrites and bacteria. Given fosfomycin 3 grams times one dose. Urine culture positive for E. Coli and Proteus. Migraine headache intermittently improved with Imitrex. She will have this prescribed at discharge. White blood cell count normalized on 4/7. ? ? ?Blood pressures were monitored on TID basis and amlodipine increased to 10 mg daily. ? ?Diabetes has been monitored with ac/hs CBG checks and SSI was use prn for tighter BS control. On semaglutide weekly at home. Blood sugars remained stable and improved as steroids tapered. ? ? ?Rehab course: During patient's stay in rehab weekly team conferences were held to monitor patient's progress, set goals and discuss barriers to discharge. At admission, patient  required minimal to moderate assistance with balance and mobility and ADLs. Bathing with supervision at EOB and sit<>stand with RW. Dressing mod I with sit<>stand from EOB using RW. Will continue therapies as outpatient at neuro rehab. ? ?She  has had improvement in activity tolerance, balance, postural control as well as ability to compensate  for deficits. She has had improvement in functional use RUE/LUE  and RLE/LLE as well as improvement in awareness ? ?Disposition: Home ?Discharge disposition: 01-Home or Self Care ? ? ? ? ? ?Diet: Carb modified ? ?Special Instructions: ?No driving, alcohol consumption or tobacco use.  ?Discharge Instructions   ? ? Ambulatory referral to Occupational Therapy   Complete by: As direct

## 2021-11-16 NOTE — Plan of Care (Signed)
?  Problem: RH Expression Communication ?Goal: LTG Patient will increase speech intelligibility (SLP) ?Description: LTG: Patient will increase speech intelligibility at word/phrase/conversation level with cues, % of the time (SLP) ?Flowsheets (Taken 11/16/2021 2106) ?LTG: Patient will increase speech intelligibility (SLP): Supervision ?Level: (Sentence) Other (Comment) ?Percent of time patient will use intelligible speech: 90% ?  ?Problem: RH Problem Solving ?Goal: LTG Patient will demonstrate problem solving for (SLP) ?Description: LTG:  Patient will demonstrate problem solving for basic/complex daily situations with cues  (SLP) ?Flowsheets (Taken 11/16/2021 2106) ?LTG: Patient will demonstrate problem solving for (SLP): Basic daily situations ?LTG Patient will demonstrate problem solving for: Minimal Assistance - Patient > 75% ?  ?Problem: RH Memory ?Goal: LTG Patient will demonstrate ability for day to day (SLP) ?Description: LTG:   Patient will demonstrate ability for day to day recall/carryover during cognitive/linguistic activities with assist  (SLP) ?Flowsheets (Taken 11/16/2021 2106) ?LTG: Patient will demonstrate ability for day to day recall: New information ?LTG: Patient will demonstrate ability for day to day recall/carryover during cognitive/linguistic activities with assist (SLP): Minimal Assistance - Patient > 75% ?  ?Problem: RH Attention ?Goal: LTG Patient will demonstrate this level of attention during functional activites (SLP) ?Description: LTG:  Patient will will demonstrate this level of attention during functional activites (SLP) ?Flowsheets (Taken 11/16/2021 2106) ?Patient will demonstrate during cognitive/linguistic activities the attention type of: ? Selective ? Alternating ?Patient will demonstrate this level of attention during cognitive/linguistic activities in: Controlled ?LTG: Patient will demonstrate this level of attention during cognitive/linguistic activities with assistance of (SLP):  Minimal Assistance - Patient > 75% ?Number of minutes patient will demonstrate attention during cognitive/linguistic activities: 30 ?  ?Problem: RH Awareness ?Goal: LTG: Patient will demonstrate awareness during functional activites type of (SLP) ?Description: LTG: Patient will demonstrate awareness during functional activites type of (SLP) ?Flowsheets (Taken 11/16/2021 2106) ?Patient will demonstrate during cognitive/linguistic activities awareness type of: Emergent ?LTG: Patient will demonstrate awareness during cognitive/linguistic activities with assistance of (SLP): Minimal Assistance - Patient > 75% ?  ?

## 2021-11-16 NOTE — Evaluation (Signed)
Occupational Therapy Assessment and Plan ? ?Patient Details  ?Name: Virginia Luna ?MRN: [id number] ?Date of Birth: [birth year] ? ?OT Diagnosis: abnormal posture, acute pain, cognitive deficits, disturbance of vision, and muscle weakness (generalized) ?Rehab Potential: Rehab Potential (ACUTE ONLY): Good ?ELOS: 7-10 days  ? ?Today's Date: 11/16/2021 ?OT Individual Time: 7829-5621 ?OT Individual Time Calculation (min): 58 min    ? ?Hospital Problem: Principal Problem: ?  Cerebellar tumor ([place]) ?Active Problems: ?  Cerebellar mass ? ? ?Past Medical History:  ?Past Medical History:  ?Diagnosis Date  ? Allergies   ? Anemia   ? Arthritis   ? Asthma   ? Back pain   ? Chronic pain   ? Complication of anesthesia   ? woke up during colonoscopy  ? Diabetes ([place])   ? Diabetes mellitus without complication ([place])   ? Edema, lower extremity   ? Fibroid   ? Fibromyalgia   ? Chronic  ? GERD (gastroesophageal reflux disease)   ? Headache   ? migraines  ? High blood pressure   ? History of hiatal hernia   ? History of stomach ulcers   ? IBS (irritable bowel syndrome)   ? Joint pain   ? Sleep apnea   ? did use cpap-lost 25lb-says she does not need it   NO CPAP  ? Wears contact lenses   ? ?Past Surgical History:  ?Past Surgical History:  ?Procedure Laterality Date  ? BREAST BIOPSY    ? BREAST EXCISIONAL BIOPSY    ? BREAST LUMPECTOMY WITH RADIOACTIVE SEED LOCALIZATION Bilateral 11/24/2014  ? Procedure: BILATERAL BREAST LUMPECTOMY WITH RADIOACTIVE SEED LOCALIZATION;  Surgeon: [name], MD;  Location: [facility];  Service: General;  Laterality: Bilateral;  ? CHOLECYSTECTOMY    ? COLONOSCOPY    ? DILATION AND CURETTAGE OF UTERUS    ? ESOPHAGOGASTRODUODENOSCOPY (EGD) WITH PROPOFOL N/A 07/23/2016  ? Procedure: ESOPHAGOGASTRODUODENOSCOPY (EGD) WITH PROPOFOL;  Surgeon: Laurence Spates, MD;  Location: [facility];  Service: Endoscopy;  Laterality: N/A;  ? ESOPHAGOGASTRODUODENOSCOPY (EGD) WITH PROPOFOL N/A 06/11/2018  ? Procedure:  ESOPHAGOGASTRODUODENOSCOPY (EGD) WITH PROPOFOL;  Surgeon: Laurence Spates, MD;  Location: [facility];  Service: Endoscopy;  Laterality: N/A;  ? LUMBAR LAMINECTOMY  2010  ? ORIF WRIST FRACTURE Right 11/24/2020  ? Procedure: OPEN REDUCTION INTERNAL FIXATION RIGHT WRIST FRACTURE;  Surgeon: Renette Butters, MD;  Location: [facility];  Service: Orthopedics;  Laterality: Right;  ? UMBILICAL HERNIA REPAIR    ? age 65  ? UPPER GI ENDOSCOPY    ? ? ?Assessment & Plan ?Clinical Impression:  ? ?Virginia Luna is a 61 year old female who recently presented to [place], [facility] complaining of headaches and tremors.  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  On the day of admission 11/07/2021, the patient was taken to the operating room by Dr. Recardo Evangelist of neurosurgery and underwent left suboccipital craniotomy, resection of cerebellar tumor.  She tolerated the procedure well.  Postoperative MRI performed on 3/22 revealed a small area of diffusion restriction in the left cerebellum.  The patient stated she was feeling better and did not feel any weaker in her arms or legs.  She was able to ambulate to the bathroom with a walker.  She remained hemodynamically stable.  CTA of head and neck performed.  On 3/27, she was complaining of some headaches and pain in her legs.  She was constipated. ?  ?She underwent physical therapy and occupational therapy  evaluation and treatment.  Prior to surgery, she was independent.  She was requiring minimal to moderate assistance with balance and mobility post-operatively. Poor strength assessment in bilateral upper extremities.  No ataxia. The patient requires inpatient medicine and rehabilitation evaluations and services for ongoing dysfunction secondary to cerebellar tumor s/p craniotomy. Patient transferred to CIR on 11/15/2021.   ? ?Patient currently requires mod with basic self-care skills secondary to muscle weakness,  decreased cardiorespiratoy endurance, decreased coordination, decreased problem solving, decreased safety awareness, and decreased memory, and decreased standing balance.  Prior to hospitalization, patient could complete all self-care independently. ? ?Patient will benefit from skilled intervention to increase independence with basic self-care skills prior to discharge home with care partner.  Anticipate patient will require 24 hour supervision and follow up home health. ? ?OT - End of Session ?Activity Tolerance: Tolerates 10 - 20 min activity with multiple rests ?Endurance Deficit: Yes ?Endurance Deficit Description: fatigued with walking to bathroom and EOB dressing ?OT Assessment ?Rehab Potential (ACUTE ONLY): Good ?OT Barriers to Discharge: Lack of/limited family support;Home environment access/layout ?OT Patient demonstrates impairments in the following area(s): Cognition;Balance;Endurance;Pain;Safety;Vision ?OT Basic ADL's Functional Problem(s): Grooming;Bathing;Dressing;Toileting ?OT Advanced ADL's Functional Problem(s): Simple Meal Preparation ?OT Transfers Functional Problem(s): Toilet;Tub/Shower ?OT Additional Impairment(s): None ?OT Plan ?OT Intensity: Minimum of 1-2 x/day, 45 to 90 minutes ?OT Frequency: 5 out of 7 days ?OT Duration/Estimated Length of Stay: 7-10 days ?OT Treatment/Interventions: Balance/vestibular training;Discharge planning;Pain management;Self Care/advanced ADL retraining;Therapeutic Activities;UE/LE Coordination activities;Functional mobility training;Patient/family education;Therapeutic Exercise;UE/LE Strength taining/ROM;Wheelchair propulsion/positioning;DME/adaptive equipment instruction ?OT Self Feeding Anticipated Outcome(s): Independent ?OT Basic Self-Care Anticipated Outcome(s): Mod I ?OT Toileting Anticipated Outcome(s): Mod I ?OT Bathroom Transfers Anticipated Outcome(s): Mod I ?OT Recommendation ?Recommendations for Other Services: Therapeutic Recreation  consult ?Therapeutic Recreation Interventions: Pet therapy;Stress management ?Patient destination: Home ?Follow Up Recommendations: Home health OT ?Equipment Recommended: Tub/shower bench;To be determined ? ? ?OT Evaluation ?Precautions/Restrictions  ?Precautions ?Precautions: Fall ?Restrictions ?Weight Bearing Restrictions: No ?Home Living/Prior Functioning ?Home Living ?Family/patient expects to be discharged to:: Private residence ?Living Arrangements: Spouse/significant other ?Available Help at Discharge: Family, Friend(s), Available 24 hours/day (plans to go to [facility] after d/c, then home with boyfriend) ?Type of Home: House ?Home Access: Stairs to enter ?Entrance Stairs-Number of Steps: 3 ?Entrance Stairs-Rails: None ?Home Layout: One level ?Bathroom Shower/Tub: Tub/shower unit, Curtain ?Bathroom Toilet: Standard ?Bathroom Accessibility: Yes ? 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 Able to Take Stairs?: Reciprically ?Driving: Yes (some but less closer to surgery date) ?Vocation: On disability ?Leisure: Hobbies-no ?Vision ?Baseline Vision/History: 1 Wears glasses (contacts or glasses) ?Ability to See in Adequate Light: 1 Impaired ?Patient Visual Report: Blurring of vision;Other (comment) (reports nausea wtih eye movement without head turning) ?Vision Assessment?: Yes ?Eye Alignment: Within Functional Limits ?Ocular Range of Motion: Within Functional Limits ?Tracking/Visual Pursuits: Decreased smoothness of vertical tracking;Decreased smoothness of horizontal tracking ?Saccades: Decreased speed of saccadic movement;Overshoots ?Convergence: Impaired (comment) ?Visual Fields:  No apparent deficits ?Perception  ?Perception: Impaired ?Spatial Orientation: missing ADL items due to overshooting ?Praxis ?Praxis: Intact ?Cognition ?Cognition ?Overall Cognitive Status: Impaired/Different from basel

## 2021-11-16 NOTE — Evaluation (Signed)
Physical Therapy Assessment and Plan ? ?Patient Details  ?Name: Virginia Luna ?MRN: [id number] ?Date of Birth: [birth year] ? ?PT Diagnosis: Abnormality of gait, Ataxia, Cognitive deficits, Coordination disorder, Difficulty walking, Impaired cognition, Impaired sensation, Muscle weakness, and Pain in headache ?Rehab Potential: Excellent ?ELOS: ~7-10 days  ? ?Today's Date: 11/16/2021 ?PT Individual Time: 3532-9924 ?PT Individual Time Calculation (min): 60 min   ? ?Hospital Problem: Principal Problem: ?  Cerebellar tumor ([place]) ?Active Problems: ?  Cerebellar mass ? ? ?Past Medical History:  ?Past Medical History:  ?Diagnosis Date  ? Allergies   ? Anemia   ? Arthritis   ? Asthma   ? Back pain   ? Chronic pain   ? Complication of anesthesia   ? woke up during colonoscopy  ? Diabetes ([place])   ? Diabetes mellitus without complication ([place])   ? Edema, lower extremity   ? Fibroid   ? Fibromyalgia   ? Chronic  ? GERD (gastroesophageal reflux disease)   ? Headache   ? migraines  ? High blood pressure   ? History of hiatal hernia   ? History of stomach ulcers   ? IBS (irritable bowel syndrome)   ? Joint pain   ? Sleep apnea   ? did use cpap-lost 25lb-says she does not need it   NO CPAP  ? Wears contact lenses   ? ?Past Surgical History:  ?Past Surgical History:  ?Procedure Laterality Date  ? BREAST BIOPSY    ? BREAST EXCISIONAL BIOPSY    ? BREAST LUMPECTOMY WITH RADIOACTIVE SEED LOCALIZATION Bilateral 11/24/2014  ? Procedure: BILATERAL BREAST LUMPECTOMY WITH RADIOACTIVE SEED LOCALIZATION;  Surgeon: [name], MD;  Location: [facility];  Service: General;  Laterality: Bilateral;  ? CHOLECYSTECTOMY    ? COLONOSCOPY    ? DILATION AND CURETTAGE OF UTERUS    ? ESOPHAGOGASTRODUODENOSCOPY (EGD) WITH PROPOFOL N/A 07/23/2016  ? Procedure: ESOPHAGOGASTRODUODENOSCOPY (EGD) WITH PROPOFOL;  Surgeon: Laurence Spates, MD;  Location: [facility];  Service: Endoscopy;  Laterality: N/A;  ? ESOPHAGOGASTRODUODENOSCOPY (EGD) WITH  PROPOFOL N/A 06/11/2018  ? Procedure: ESOPHAGOGASTRODUODENOSCOPY (EGD) WITH PROPOFOL;  Surgeon: Laurence Spates, MD;  Location: [facility];  Service: Endoscopy;  Laterality: N/A;  ? LUMBAR LAMINECTOMY  2010  ? ORIF WRIST FRACTURE Right 11/24/2020  ? Procedure: OPEN REDUCTION INTERNAL FIXATION RIGHT WRIST FRACTURE;  Surgeon: Renette Butters, MD;  Location: [facility];  Service: Orthopedics;  Laterality: Right;  ? UMBILICAL HERNIA REPAIR    ? age 54  ? UPPER GI ENDOSCOPY    ? ? ?Assessment & Plan ?Clinical Impression: Patient is a 61 y.o. year old female  who recently presented to [place], [facility] complaining of headaches and tremors.  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  On the day of admission 11/07/2021, the patient was taken to the operating room by Dr. Recardo Evangelist of neurosurgery and underwent left suboccipital craniotomy, resection of cerebellar tumor.  She tolerated the procedure well.  Postoperative MRI performed on 3/22 revealed a small area of diffusion restriction in the left cerebellum.  The patient stated she was feeling better and did not feel any weaker in her arms or legs.  She was able to ambulate to the bathroom with a walker.  She remained hemodynamically stable.  CTA of head and neck performed.  On 3/27, she was complaining of some headaches and pain in her legs.  She was constipated. ?  ?She underwent physical therapy  and occupational therapy evaluation and treatment.  Prior to surgery, she was independent.  She was requiring minimal to moderate assistance with balance and mobility post-operatively. Poor strength assessment in bilateral upper extremities.  No ataxia. The patient requires inpatient medicine and rehabilitation evaluations and services for ongoing dysfunction secondary to cerebellar tumor s/p craniotomy Patient transferred to [place] on 11/15/2021 .  ? ?Patient currently requires  CGA/min assist  with mobility  secondary to muscle weakness, decreased cardiorespiratoy endurance, unbalanced muscle activation and decreased coordination,  , and decreased standing balance, decreased postural control, and decreased balance strategies.  Prior to hospitalization, patient was independent  with mobility and lived with Significant other in a House home.  Home access is  Stairs to enter. ? ?Patient will benefit from skilled PT intervention to maximize safe functional mobility, minimize fall risk, and decrease caregiver burden for planned discharge home with 24 hour supervision.  Anticipate patient will benefit from follow up OP at discharge. ? ?PT - End of Session ?Activity Tolerance: Tolerates 30+ min activity with multiple rests ?Endurance Deficit: Yes ?Endurance Deficit Description: fatigued with gait training; however, pt motivated to work hard ?PT Assessment ?Rehab Potential (ACUTE/IP ONLY): Excellent ?PT Barriers to Discharge: Decreased caregiver support ?PT Patient demonstrates impairments in the following area(s): Balance;Safety;Behavior;Sensory;Edema;Skin Integrity;Endurance;Motor;Nutrition;Pain;Perception ?PT Transfers Functional Problem(s): Bed Mobility;Bed to Chair;Car;Furniture;Floor ?PT Locomotion Functional Problem(s): Ambulation;Stairs ?PT Plan ?PT Intensity: Minimum of 1-2 x/day ,45 to 90 minutes ?PT Frequency: 5 out of 7 days ?PT Duration Estimated Length of Stay: ~7-10 days ?PT Treatment/Interventions: Ambulation/gait training;Community reintegration;DME/adaptive equipment instruction;Neuromuscular re-education;Psychosocial support;Stair training;UE/LE Strength taining/ROM;Wheelchair propulsion/positioning;Balance/vestibular training;Discharge planning;Functional electrical stimulation;Pain management;Skin care/wound management;Therapeutic Activities;UE/LE Coordination activities;Cognitive remediation/compensation;Disease management/prevention;Functional mobility training;Patient/family  education;Splinting/orthotics;Therapeutic Exercise;Visual/perceptual remediation/compensation ?PT Transfers Anticipated Outcome(s): mod-I using LRAD ?PT Locomotion Anticipated Outcome(s): supervision using LRAD ?PT Recommendation ?Follow Up Recommendations: Outpatient PT;24 hour supervision/assistance ?Patient destination: Home ?Equipment Recommended: To be determined ? ? ?PT Evaluation ?Precautions/Restrictions ?Precautions ?Precautions: Fall ?Restrictions ?Weight Bearing Restrictions: No ?Pain ?Pain Assessment ?Pain Scale: 0-10 ?Pain Score:  (states "just a little discomfort") ?Faces Pain Scale: Hurts a little bit ?Pain Type: Acute pain ?Pain Location: Head (and neck) ?Pain Descriptors / Indicators: Headache ?Pain Onset: On-going ?Pain Intervention(s): Medication (See eMAR);Rest;Relaxation;Emotional support;Repositioned ?Pain Interference ?Pain Interference ?Pain Effect on Sleep: 4. Almost constantly ?Pain Interference with Therapy Activities: 1. Rarely or not at all ?Pain Interference with Day-to-Day Activities: 1. Rarely or not at all ?Home Living/Prior Functioning ?Home Living ?Available Help at Discharge: Family;Friend(s);Available 24 hours/day (boyfriend works full time and is off only on Wednesdays & Thursdays - pt planning to D/C to [employer] [facility]) house with [employer] daugter Caryl Pina) helping during the day) ?Type of Home: House (information is for pt's Godsister's house) ?Home Access: Stairs to enter ?Entrance Stairs-Number of Steps: 1 step-up onto patio and then threshold to enter house ?Entrance Stairs-Rails:  (pt thinks there is a handrail to step-up onto patio but unsure of the side) ?Home Layout: One level ?Bathroom Shower/Tub: Tub/shower unit;Curtain ?Bathroom Toilet: Standard ?Bathroom Accessibility: Yes ?Additional Comments: pt reports she was having episodes where she would "black out" randomly over the past 2 years so she has limited how much she goes out of the home ? Lives With:  Significant other (boyfriend, Kennith [place]) ?Prior Function ?Level of Independence: Independent with gait;Independent with transfers;Independent with homemaking with ambulation ? Able to Take Stairs?: Yes ?Driving: Yes (had stopped driving tempora

## 2021-11-16 NOTE — Progress Notes (Signed)
?                                                       PROGRESS NOTE ? ? ?Subjective/Complaints: ? ?Pt admits "always hurting" but no worse than normal.  ?LBM yesterday.  ?Didn't recognize me from outpt clinic.  ? ? ? ? ?ROS: ? ?Pt denies SOB, abd pain, CP, N/V/C/D, and vision changes ? ? ?Objective: ?  ?No results found. ?Recent Labs  ?  11/16/21 ?6468  ?WBC 18.6*  ?HGB 10.7*  ?HCT 33.4*  ?PLT 513*  ? ?Recent Labs  ?  11/16/21 ?0321  ?NA 139  ?K 3.9  ?CL 102  ?CO2 29  ?GLUCOSE 141*  ?BUN 13  ?CREATININE 0.49  ?CALCIUM 8.9  ? ? ?Intake/Output Summary (Last 24 hours) at 11/16/2021 0909 ?Last data filed at 11/15/2021 1900 ?Gross per 24 hour  ?Intake 600 ml  ?Output --  ?Net 600 ml  ?  ? ?  ? ?Physical Exam: ?Vital Signs ?Blood pressure 126/72, pulse 73, temperature 97.9 ?F (36.6 ?C), resp. rate 16, height '5\' 1"'$  (1.549 m), weight 119.4 kg, SpO2 99 %. ? ? ?General: awake, alert, appropriate, sitting up in bed; didn't recognize me from clinic; NAD ?HENT: conjugate gaze; oropharynx moist ?CV: regular rate; no JVD ?Pulmonary: CTA B/L; no W/R/R- good air movement ?GI: soft, NT, ND, (+)BS- hypoactive ?Psychiatric: appropriate- but very flat ?Neurological: alert, but didn't recognize me; sing song voice-  ?Skin: incision c/d/i ?Neuro/ Musculoskeletal: Alert and oriented x4. 5/5 strength. Sensation intact. Cervical myofascial tenderness. Tenderness bilateral knees.  ? ?Assessment/Plan: ?1. Functional deficits which require 3+ hours per day of interdisciplinary therapy in a comprehensive inpatient rehab setting. ?Physiatrist is providing close team supervision and 24 hour management of active medical problems listed below. ?Physiatrist and rehab team continue to assess barriers to discharge/monitor patient progress toward functional and medical goals ? ?Care Tool: ? ?Bathing ?   ?   ?   ?  ?  ?Bathing assist   ?  ?  ?Upper Body Dressing/Undressing ?Upper body dressing   ?  ?   ?Upper body assist   ?   ?Lower Body  Dressing/Undressing ?Lower body dressing ? ? ?   ?  ? ?  ? ?Lower body assist   ?   ? ?Toileting ?Toileting    ?Toileting assist Assist for toileting: Minimal Assistance - Patient > 75% ?  ?  ?Transfers ?Chair/bed transfer ? ?Transfers assist ?   ? ?  ?  ?  ?Locomotion ?Ambulation ? ? ?Ambulation assist ? ?   ? ?  ?  ?   ? ?Walk 10 feet activity ? ? ?Assist ?   ? ?  ?   ? ?Walk 50 feet activity ? ? ?Assist   ? ?  ?   ? ? ?Walk 150 feet activity ? ? ?Assist   ? ?  ?  ?  ? ?Walk 10 feet on uneven surface  ?activity ? ? ?Assist   ? ? ?  ?   ? ?Wheelchair ? ? ? ? ?Assist   ?  ?  ? ?  ?   ? ? ?Wheelchair 50 feet with 2 turns activity ? ? ? ?Assist ? ?  ?  ? ? ?   ? ?Wheelchair 150 feet activity  ? ? ? ?  Assist ?   ? ? ?   ? ?Blood pressure 126/72, pulse 73, temperature 97.9 ?F (36.6 ?C), resp. rate 16, height '5\' 1"'$  (1.549 m), weight 119.4 kg, SpO2 99 %. ? ?Medical Problem List and Plan: ?1. 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            -patient may shower but incision must be covered ?            -ELOS/Goals: 10-14 days S ?            -First day of evaluations for PT, OT and SLP- CIR ?2.  Antithrombotics: ?-DVT/anticoagulation:  Mechanical: Sequential compression devices, below knee Bilateral lower extremities ?            -antiplatelet therapy: none ?3. Postoperative pain and hx of Fibromyalgia: continue Tylenol, Norco, Robaxin 750 mg PRN ? 3/30- pain no worse than normal- so will continue current regimen ?4. Mood: LCSW to evaluate and provide emotional support ?            -antipsychotic agents: Seroquel ?5. Neuropsych: This patient is capable of making decisions on her own behalf. ?6. Skin/Wound Care: Routine skin care checks ?7. Fluids/Electrolytes/Nutrition: Routine I's and O's and follow-up chemistries ?8: Cerebellar tumor s/p craniectomy: continue Trileptal 900 mg q HS ?            --meclizine prn  ?9: Hypertension: increase amlodipine  to '10mg'$  daily ? 3/30- BP is well controlled with increase meds- con't regimen ?10: Hyperlipidemia: continue atorvastatin 10 mg daily ?11: Seasonal allergies: continue Xyzal, Astelin, Flonase prn ?12: Asthma: continue Singulair, Breo Ellipta, Proventil ?13: Fibromyalgia: pain management as above ?14: Migraine headache: Maxalt prn ?15: Insomnia: continue melatonin ?16: Sleep apnea: no records of CPAP/BiPap ?21: DM-2: continue CBG monitoring and carb mod diet ?            --continue semaglutide [place] every Sunday ? 3/30- CBGs 140s-150s- con't regimen and mointor for trend ?18: Neurogenic bladder: no records ?19: vitamin D deficiency: no supplements. Check levels ? 20. Leukocytosis ? 3/30- WBC 18.6- likely due to Dexamethasone- will monitor- afebrile and no other Sx's of infection ? ?LOS: ?1 days ?A FACE TO FACE EVALUATION WAS PERFORMED ? ?Virginia Luna ?11/16/2021, 9:09 AM  ? ? ? ?

## 2021-11-16 NOTE — Evaluation (Addendum)
Speech Language Pathology Assessment and Plan ? ?Patient Details  ?Name: Virginia Luna ?MRN: [id number] ?Date of Birth: 07/08/1961 ? ?SLP Diagnosis: Dysarthria;Cognitive Impairments  ?Rehab Potential:   ?ELOS: 7-10 days  ? ? ?Today's Date: 11/16/2021 ?SLP Individual Time: 3614-4315 ?SLP Individual Time Calculation (min): 60 min ? ? ?Hospital Problem: Principal Problem: ?  Cerebellar tumor ([place]) ?Active Problems: ?  Cerebellar mass ? ?Past Medical History:  ?Past Medical History:  ?Diagnosis Date  ? Allergies   ? Anemia   ? Arthritis   ? Asthma   ? Back pain   ? Chronic pain   ? Complication of anesthesia   ? woke up during colonoscopy  ? Diabetes ([place])   ? Diabetes mellitus without complication ([place])   ? Edema, lower extremity   ? Fibroid   ? Fibromyalgia   ? Chronic  ? GERD (gastroesophageal reflux disease)   ? Headache   ? migraines  ? High blood pressure   ? History of hiatal hernia   ? History of stomach ulcers   ? IBS (irritable bowel syndrome)   ? Joint pain   ? Sleep apnea   ? did use cpap-lost 25lb-says she does not need it   NO CPAP  ? Wears contact lenses   ? ?Past Surgical History:  ?Past Surgical History:  ?Procedure Laterality Date  ? BREAST BIOPSY    ? BREAST EXCISIONAL BIOPSY    ? BREAST LUMPECTOMY WITH RADIOACTIVE SEED LOCALIZATION Bilateral 11/24/2014  ? Procedure: BILATERAL BREAST LUMPECTOMY WITH RADIOACTIVE SEED LOCALIZATION;  Surgeon: [name], MD;  Location: [facility];  Service: General;  Laterality: Bilateral;  ? CHOLECYSTECTOMY    ? COLONOSCOPY    ? DILATION AND CURETTAGE OF UTERUS    ? ESOPHAGOGASTRODUODENOSCOPY (EGD) WITH PROPOFOL N/A 07/23/2016  ? Procedure: ESOPHAGOGASTRODUODENOSCOPY (EGD) WITH PROPOFOL;  Surgeon: Laurence Spates, MD;  [facility];  Service: Endoscopy;  Laterality: N/A;  ? ESOPHAGOGASTRODUODENOSCOPY (EGD) WITH PROPOFOL N/A 06/11/2018  ? Procedure: ESOPHAGOGASTRODUODENOSCOPY (EGD) WITH PROPOFOL;  Surgeon: Laurence Spates, MD;  [facility];  Service: Endoscopy;  Laterality: N/A;  ? LUMBAR LAMINECTOMY  2010  ? ORIF WRIST FRACTURE Right 11/24/2020  ? Procedure: OPEN REDUCTION INTERNAL FIXATION RIGHT WRIST FRACTURE;  Surgeon: Renette Butters, MD;  [facility];  Service: Orthopedics;  Laterality: Right;  ? UMBILICAL HERNIA REPAIR    ? age 51  ? UPPER GI ENDOSCOPY    ? ? ?Assessment / Plan / Recommendation ?Clinical Impression Per chart review, "Patient is a 61 y.o. year old female  who recently presented to [place], [facility] complaining of headaches and tremors.  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  On the day of admission 11/07/2021, the patient was taken to the operating room by Dr. Recardo Evangelist of neurosurgery and underwent left suboccipital craniotomy, resection of cerebellar tumor.  She tolerated the procedure well.  Postoperative MRI performed on 3/22 revealed a small area of diffusion restriction in the left cerebellum.  The patient stated she was feeling better and did not feel any weaker in her arms or legs.  She was able to ambulate to the bathroom with a walker.  She remained hemodynamically stable.  CTA of head and neck performed.  On 3/27, she was complaining of some headaches and pain in her legs." Patient transferred to [place] on 11/15/2021 for further skilled intervention. ? ?Pt seen this date for clinical swallow evaluation, motor speech,  and cognitive-linguistic evaluation in the setting of cerebellar tumor s/p craniotomy. Please see below for objective findings. ? ?Per clinical swallow evaluation, pt presents with mild; nevertheless, functional oral phase of deglutition. Oral phase c/b generalized orofacial weakness, reduced facial ROM on R side, and mildly prolonged mastication. Minimal oral residuals noted post-swallow, and were successfully cleared with use of liquid rinse and lingual sweep. No evidence of pharyngeal nor esophageal dysphagia  observed across challenging trials of thin liquid via cup and straw, puree, soft solid textures, and hard solid textures; swallow initiation appeared timely with dry/clear vocal quality appreciated between trials. Pt independently implemented general aspiration precautions of upright positioning, small + single bites/sips, and lingual sweep with liquid rinse throughout. At this time, deglutition appears to support consumption of regular diet textures and thin liquids with implementation of aspiration precautions outlined above. Medications whole in puree. ? ?Per informal motor speech evaluation, pt presents with mild-moderate dysarthria most consistent with ataxic subtype. Speech output negatively impacted by imprecise articulation and vowels and poor word boundaries/rapid rate, and incoordination of speech musculature. Corrected communication breakdown by slowing her speaking rate with min verbal cues for repetition. Subjectively, speech intelligibility was judged to be ~80%, at the conversational level, to unfamiliar listener in a quiet/controlled environment No dysfluencies appreciated.  ? ?Per the COGNISTAT and informal + dynamic assessment, pt presents with overall moderate cognitive-linguistic impairments. More specifically, pt presents with borderline impairment in short-term recall + working memory, and moderate impairment in mental math calculations, attention, and verbal judgment/problem-solving skills. Pt verbalized that she feels "slow" and is not functioning at her cognitive baseline; prior to admission pt reports independence with all iADL tasks. Conversely, receptive and expressive language skills appear grossly intact for basic verbal and functional tasks.  ? ?Given assessment findings, pt would benefit from skilled ST intervention for acute motor speech and cognitive-linguistic deficits. Results and recommendations of assessment and proposed POC reviewed with pt; she verbalized understanding and  appeared amenable to plan. Will plan to follow during IPR admission.  ?  ?Skilled Therapeutic Interventions          Bedside swallow evaluation, COGNISTAT, and informal + dynamic assessment completed. Please see above for details. ?  ?SLP Assessment ? Patient will need skilled Speech Lanaguage Pathology Services during CIR admission  ?  ?Recommendations ? SLP Diet Recommendations: Age appropriate regular solids;Thin ?Liquid Administration via: Cup;Straw ?Medication Administration: Whole meds with puree ?Supervision: Patient able to self feed;Intermittent supervision to cue for compensatory strategies ?Compensations: Minimize environmental distractions;Slow rate;Small sips/bites;Lingual sweep for clearance of pocketing;Follow solids with liquid ?Postural Changes and/or Swallow Maneuvers: Seated upright 90 degrees;Upright 30-60 min after meal ?Oral Care Recommendations: Oral care BID ?Patient destination: Home ?Follow up Recommendations: Home Health SLP;Outpatient SLP;24 hour supervision/assistance ?Equipment Recommended: None recommended by SLP  ?  ?SLP Frequency 3 to 5 out of 7 days   ?SLP Duration ? ?SLP Intensity ? ?SLP Treatment/Interventions 7-10 days ? ?Minumum of 1-2 x/day, 30 to 90 minutes ? ?Cognitive remediation/compensation;Cueing hierarchy;Dysphagia/aspiration precaution training;Functional tasks;Internal/external aids;Medication managment;Patient/family education;Therapeutic Activities   ? ?Pain ?Pain Assessment ?Pain Scale: 0-10 ?Pain Score: 0-No pain ?Pain Type: Acute pain ?Pain Location: Head (and neck) ?Pain Descriptors / Indicators: Headache ?Pain Onset: On-going ?Pain Intervention(s): Medication (See eMAR);Rest;Relaxation;Emotional support;Repositioned ? ?Prior Functioning ?Cognitive/Linguistic Baseline: Within functional limits ?Type of Home: House ? Lives With: Significant other ?Available Help at Discharge: Family;Friend(s);Available 24 hours/day ?Vocation: On disability ? ?SLP  Evaluation ?Cognition ?Overall Cognitive Status: Impaired/Different from baseline ?Arousal/Alertness: Awake/alert ?Orientation Level:  Oriented X4 (COGNISTAT Orientation subtest score - 10/12 - WFL) ?Year: 2023 ?Month: March ?Day

## 2021-11-16 NOTE — Progress Notes (Signed)
Inpatient Rehabilitation  Patient information reviewed and entered into eRehab system by Tyrees Chopin Sharod Petsch, OTR/L.   Information including medical coding, functional ability and quality indicators will be reviewed and updated through discharge.    

## 2021-11-16 NOTE — Plan of Care (Signed)
?  Problem: RH Balance ?Goal: LTG Patient will maintain dynamic sitting balance (PT) ?Description: LTG:  Patient will maintain dynamic sitting balance with assistance during mobility activities (PT) ?Flowsheets (Taken 11/16/2021 1937) ?LTG: Pt will maintain dynamic sitting balance during mobility activities with:: Independent with assistive device  ?Goal: LTG Patient will maintain dynamic standing balance (PT) ?Description: LTG:  Patient will maintain dynamic standing balance with assistance during mobility activities (PT) ?Flowsheets (Taken 11/16/2021 1937) ?LTG: Pt will maintain dynamic standing balance during mobility activities with:: Supervision/Verbal cueing ?  ?Problem: Sit to Stand ?Goal: LTG:  Patient will perform sit to stand with assistance level (PT) ?Description: LTG:  Patient will perform sit to stand with assistance level (PT) ?Flowsheets (Taken 11/16/2021 1937) ?LTG: PT will perform sit to stand in preparation for functional mobility with assistance level: Independent with assistive device ?  ?Problem: RH Bed Mobility ?Goal: LTG Patient will perform bed mobility with assist (PT) ?Description: LTG: Patient will perform bed mobility with assistance, with/without cues (PT). ?Flowsheets (Taken 11/16/2021 1937) ?LTG: Pt will perform bed mobility with assistance level of: Independent with assistive device  ?  ?Problem: RH Bed to Chair Transfers ?Goal: LTG Patient will perform bed/chair transfers w/assist (PT) ?Description: LTG: Patient will perform bed to chair transfers with assistance (PT). ?Flowsheets (Taken 11/16/2021 1937) ?LTG: Pt will perform Bed to Chair Transfers with assistance level: Independent with assistive device  ?  ?Problem: RH Car Transfers ?Goal: LTG Patient will perform car transfers with assist (PT) ?Description: LTG: Patient will perform car transfers with assistance (PT). ?Flowsheets (Taken 11/16/2021 1937) ?LTG: Pt will perform car transfers with assist:: Supervision/Verbal cueing ?   ?Problem: RH Ambulation ?Goal: LTG Patient will ambulate in controlled environment (PT) ?Description: LTG: Patient will ambulate in a controlled environment, # of feet with assistance (PT). ?Flowsheets (Taken 11/16/2021 1937) ?LTG: Pt will ambulate in controlled environ  assist needed:: Supervision/Verbal cueing ?LTG: Ambulation distance in controlled environment: 171f using LRAD ?Goal: LTG Patient will ambulate in home environment (PT) ?Description: LTG: Patient will ambulate in home environment, # of feet with assistance (PT). ?Flowsheets (Taken 11/16/2021 1937) ?LTG: Pt will ambulate in home environ  assist needed:: Supervision/Verbal cueing ?LTG: Ambulation distance in home environment: 544fusing LRAD ?  ?Problem: RH Stairs ?Goal: LTG Patient will ambulate up and down stairs w/assist (PT) ?Description: LTG: Patient will ambulate up and down # of stairs with assistance (PT) ?Flowsheets (Taken 11/16/2021 1937) ?LTG: Pt will ambulate up/down stairs assist needed:: Supervision/Verbal cueing ?LTG: Pt will  ambulate up and down number of stairs: 1 step using LRAD per home set-up ?  ?

## 2021-11-17 ENCOUNTER — Encounter (INDEPENDENT_AMBULATORY_CARE_PROVIDER_SITE_OTHER): Payer: Self-pay | Admitting: Family Medicine

## 2021-11-17 DIAGNOSIS — D496 Neoplasm of unspecified behavior of brain: Secondary | ICD-10-CM | POA: Diagnosis not present

## 2021-11-17 LAB — GLUCOSE, CAPILLARY
Glucose-Capillary: 123 mg/dL — ABNORMAL HIGH (ref 70–99)
Glucose-Capillary: 107 mg/dL — ABNORMAL HIGH (ref 70–99)
Glucose-Capillary: 136 mg/dL — ABNORMAL HIGH (ref 70–99)
Glucose-Capillary: 190 mg/dL — ABNORMAL HIGH (ref 70–99)

## 2021-11-17 MED ORDER — DEXAMETHASONE 4 MG PO TABS
2.0000 mg | ORAL_TABLET | Freq: Every day | ORAL | Status: DC
Start: 2021-11-23 — End: 2021-11-17

## 2021-11-17 MED ORDER — DEXAMETHASONE 4 MG PO TABS: 4.0000 mg | ORAL_TABLET | Freq: Every day | ORAL | Status: DC

## 2021-11-17 MED ORDER — DEXAMETHASONE 4 MG PO TABS
2.0000 mg | ORAL_TABLET | Freq: Every day | ORAL | Status: AC
  Administered 2021-11-21 – 2021-11-22 (×2): 2 mg via ORAL
  Filled 2021-11-17 (×2): qty 1

## 2021-11-17 MED ORDER — DEXAMETHASONE 4 MG PO TABS
4.0000 mg | ORAL_TABLET | Freq: Every day | ORAL | Status: AC
  Administered 2021-11-19 – 2021-11-20 (×2): 4 mg via ORAL
  Filled 2021-11-17 (×2): qty 1

## 2021-11-17 MED ORDER — DEXAMETHASONE 4 MG PO TABS
4.0000 mg | ORAL_TABLET | Freq: Two times a day (BID) | ORAL | Status: AC
  Administered 2021-11-18 (×2): 4 mg via ORAL
  Filled 2021-11-17 (×2): qty 1

## 2021-11-17 MED ORDER — DEXAMETHASONE 4 MG PO TABS: 4.0000 mg | ORAL_TABLET | Freq: Two times a day (BID) | ORAL | Status: DC

## 2021-11-17 NOTE — Care Management (Signed)
Inpatient Rehabilitation Center ?Individual Statement of Services ? ?Patient Name:  Virginia Luna  ?Date:  11/17/2021 ? ?Welcome to the [facility].  Our goal is to provide you with an individualized program based on your diagnosis and situation, designed to meet your specific needs.  With this comprehensive rehabilitation program, you will be expected to participate in at least 3 hours of rehabilitation therapies Monday-Friday, with modified therapy programming on the weekends. ? ?Your rehabilitation program will include the following services:  Physical Therapy (PT), Occupational Therapy (OT), Speech Therapy (ST), 24 hour per day rehabilitation nursing, Therapeutic Recreaction (TR), Psychology, Neuropsychology, Care Coordinator, Rehabilitation Medicine, Nutrition Services, Pharmacy Services, and Other ? ?Weekly team conferences will be held on Tuesdays to discuss your progress.  Your Inpatient Rehabilitation Care Coordinator will talk with you frequently to get your input and to update you on team discussions.  Team conferences with you and your family in attendance may also be held. ? ?Expected length of stay: 7-10 days   ? ?Overall anticipated outcome: Independent with Assistive Device ? ?Depending on your progress and recovery, your program may change. Your Inpatient Rehabilitation Care Coordinator will coordinate services and will keep you informed of any changes. Your Inpatient Rehabilitation Care Coordinator's name and contact numbers are listed  below. ? ?The following services may also be recommended but are not provided by the [facility]:  ?Driving Evaluations ?Home Health Rehabiltiation Services ?Outpatient Rehabilitation Services ?Vocational Rehabilitation ?  ?Arrangements will be made to provide these services after discharge if needed.  Arrangements include referral to agencies that provide these services. ? ?Your insurance has been verified to be:  [employer] ? ?Your primary doctor is:  Shirline Frees ? ?Pertinent information will be shared with your doctor and your insurance company. ? ?Inpatient Rehabilitation Care Coordinator:  Cathleen Corti [phone] or (C) [phone] ? ?Information discussed with and copy given to patient by: Rana Snare, 11/17/2021, 11:23 AM    ?

## 2021-11-17 NOTE — Progress Notes (Signed)
Occupational Therapy Session Note ? ?Patient Details  ?Name: Virginia Luna ?MRN: [id number] ?Date of Birth: 23-Jun-1961 ? ?Today's Date: 11/17/2021 ?OT Individual Time: 1000-1111 ?OT Individual Time Calculation (min): 71 min  ? ? ?Short Term Goals: ?Week 1:  OT Short Term Goal 1 (Week 1): STG = LTG due to ELOS ? ?Skilled Therapeutic Interventions/Progress Updates:  ?  Pt resting in bed upon arrival. OT intervention with focus on bed mobility, functional amb with RW, standing balance, BADL retraining including bathing/dressing with sit<>stand from EOB, toileting, and activity tolerance to increase independence with BADLs. Supine>sit EOB with supervision. Pt requested to use toilet and amb with RW to bathroom with CGA. Toileting with CGA. Sit<>stand from toilet with CGA using guard rail.  Pt returned to EOB to complete bathing/dressing. CGA when standing. Min verbal cues for technique to thread pants and don socks. Pt requires more then a reasonable amount of time to initiate and complete tasks. Pt c/o increased dizziness with activity and requested meclizine. Sit>supine in bed with supervision and min verbal cues. Pt remained in bed with all needs within reach and bed alarm activated.   ? ?Therapy Documentation ?Precautions:  ?Precautions ?Precautions: Fall ?Restrictions ?Weight Bearing Restrictions: No ?  ?Pain: ?Pain Assessment ?Pain Scale: Faces ?Faces Pain Scale: Hurts a little bit ?Pain Type: Acute pain ?Pain Location: Neck ?Pain Descriptors / Indicators: Aching ?Pain Onset: On-going ?Pain Intervention(s): premedicated ? ? ?Therapy/Group: Individual Therapy ? ?Leroy Libman ?11/17/2021, 11:13 AM ?

## 2021-11-17 NOTE — Progress Notes (Signed)
Speech Language Pathology Daily Session Note ? ?Patient Details  ?Name: Virginia Luna ?MRN: [id number] ?Date of Birth: Mar 15, 1961 ? ?Today's Date: 11/17/2021 ?SLP Individual Time: 0037-0488 ?SLP Individual Time Calculation (min): 49 min ? ?Short Term Goals: ?Week 1: SLP Short Term Goal 1 (Week 1): Pt will utilize 2 out of 4 memory strategies to recall functional information re: iADL's given Min verbal A. ?SLP Short Term Goal 2 (Week 1): Pt will complete basic + functional problem-solving tasks re: pill organizer, money, and scheduling to prepare for discharge given Min A for use of attention and executive functioning strategies. ?SLP Short Term Goal 3 (Week 1): Pt will communicate wants/needs, ideas, and thoughts with 90% intelligibility given Min A for use of pacing and over-articulation strategies. ? ?Skilled Therapeutic Interventions: ?Pt seen for skilled ST with focus on cognitive goals, pt in bed and agreeable to therapeutic tasks. Pt initially emotionally labile discussion recent medical events, SLP providing support as needed. Pt states she had tremors this morning that have resolved, is able to hold pen when "I couldn't have done any reading or writing a little while ago". Basic symbol cancellation task with 100% accuracy, no reported deficits in vision during task. Pt states near vision is functional but there is a change in distant vision. SLP facilitating portions of ALFA including "Solving Daily Math Problems" - 80% and "Using a Calendar" - 60%. Pt was Supervision A for problem solving during mathematics subtest, utilizing calculator on phone as needed, asking for extra time and verbalizing "I'm going to double check my answers". Pt errors on calendar task related to recall of information, states this has been a noticeable change for her s/p surgery and she "feels dumb". Pt educated on current cognitive deficits/strengths and effective compensatory strategies. Pt speech intelligibility at conversation  level ~70% this date, SLP providing sign in room to attempt to increase pt recall and utilization of SLOP strategies for speech intelligibility. Pt requiring overall min A cues to utilize during session. Pt left in bed with alarm set and all needs within reach. Cont ST POC.  ? ?Pain ?Pain Assessment ?Pain Scale: 0-10 ?Pain Score: 0-No pain ? ?Therapy/Group: Individual Therapy ? ?Dewaine Conger ?11/17/2021, 12:09 PM ?

## 2021-11-17 NOTE — Progress Notes (Signed)
?                                                       PROGRESS NOTE ? ? ?Subjective/Complaints: ? ?Pt reports had some "shakes" earlier- doesn't know why- didn't last very long.  ?LBM overnight.  ?Pain -chronic- about the same.  ? ? ?ROS: ? ?Pt denies SOB, abd pain, CP, N/V/C/D, and vision changes ? ? ?Objective: ?  ?No results found. ?Recent Labs  ?  11/16/21 ?9485  ?WBC 18.6*  ?HGB 10.7*  ?HCT 33.4*  ?PLT 513*  ? ?Recent Labs  ?  11/16/21 ?4627  ?NA 139  ?K 3.9  ?CL 102  ?CO2 29  ?GLUCOSE 141*  ?BUN 13  ?CREATININE 0.49  ?CALCIUM 8.9  ? ? ?Intake/Output Summary (Last 24 hours) at 11/17/2021 1130 ?Last data filed at 11/17/2021 0350 ?Gross per 24 hour  ?Intake 600 ml  ?Output --  ?Net 600 ml  ?  ? ?  ? ?Physical Exam: ?Vital Signs ?Blood pressure 133/72, pulse 73, temperature 97.9 ?F (36.6 ?C), temperature source Oral, resp. rate 16, height '5\' 1"'$  (1.549 m), weight 119.4 kg, SpO2 100 %. ? ? ? ?General: awake, alert, appropriate, sitting up in bed; ate 100% tray; NAD ?HENT: conjugate gaze; oropharynx moist- L posterior skull incision looks great- no drainage. No erythema ?CV: regular rate; no JVD ?Pulmonary: CTA B/L; no W/R/R- good air movement ?GI: soft, NT, ND, (+)BS ?Psychiatric: appropriate ?Neurological: alert- remembers me from yesterday, but still confused about me from clinic ?Skin: incision c/d/i ?Neuro/ Musculoskeletal: Alert and oriented x4. 5/5 strength. Sensation intact. Cervical myofascial tenderness. Tenderness bilateral knees.  ? ?Assessment/Plan: ?1. Functional deficits which require 3+ hours per day of interdisciplinary therapy in a comprehensive inpatient rehab setting. ?Physiatrist is providing close team supervision and 24 hour management of active medical problems listed below. ?Physiatrist and rehab team continue to assess barriers to discharge/monitor patient progress toward functional and medical goals ? ?Care Tool: ? ?Bathing ?   ?Body parts bathed by patient: Right arm, Left arm, Chest,  Abdomen, Front perineal area, Buttocks, Right upper leg, Left upper leg, Face  ? Body parts bathed by helper: Right lower leg, Left lower leg ?  ?  ?Bathing assist Assist Level: Minimal Assistance - Patient > 75% ?  ?  ?Upper Body Dressing/Undressing ?Upper body dressing   ?What is the patient wearing?: Pull over shirt ?   ?Upper body assist Assist Level: Supervision/Verbal cueing ?   ?Lower Body Dressing/Undressing ?Lower body dressing ? ? ?   ?What is the patient wearing?: Underwear/pull up, Pants ? ?  ? ?Lower body assist Assist for lower body dressing: Contact Guard/Touching assist ?   ? ?Toileting ?Toileting    ?Toileting assist Assist for toileting: Contact Guard/Touching assist ?  ?  ?Transfers ?Chair/bed transfer ? ?Transfers assist ?   ? ?Chair/bed transfer assist level: Minimal Assistance - Patient > 75% ?  ?  ?Locomotion ?Ambulation ? ? ?Ambulation assist ? ?   ? ?Assist level: Minimal Assistance - Patient > 75% ?  ?Max distance: 158f  ? ?Walk 10 feet activity ? ? ?Assist ?   ? ?Assist level: Minimal Assistance - Patient > 75% ?   ? ?Walk 50 feet activity ? ? ?Assist   ? ?Assist level: Minimal Assistance - Patient >  75% ?   ? ? ?Walk 150 feet activity ? ? ?Assist Walk 150 feet activity did not occur: Safety/medical concerns ? ?  ?  ?  ? ?Walk 10 feet on uneven surface  ?activity ? ? ?Assist Walk 10 feet on uneven surfaces activity did not occur: Safety/medical concerns ? ? ?  ?   ? ?Wheelchair ? ? ? ? ?Assist Is the patient using a wheelchair?: Yes (for energy conservation) ?Type of Wheelchair: Manual ?  ? ?Wheelchair assist level: Dependent - Patient 0% ?   ? ? ?Wheelchair 50 feet with 2 turns activity ? ? ? ?Assist ? ?  ?  ? ? ?Assist Level: Dependent - Patient 0%  ? ?Wheelchair 150 feet activity  ? ? ? ?Assist ?   ? ? ?Assist Level: Dependent - Patient 0%  ? ?Blood pressure 133/72, pulse 73, temperature 97.9 ?F (36.6 ?C), temperature source Oral, resp. rate 16, height '5\' 1"'$  (1.549 m), weight 119.4 kg,  SpO2 100 %. ? ?Medical Problem List and Plan: ?1. 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            -patient may shower but incision must be covered ?            -ELOS/Goals: 10-14 days S ?            Continue CIR- PT, OT and SLP ? ?2.  Antithrombotics: ?-DVT/anticoagulation:  Mechanical: Sequential compression devices, below knee Bilateral lower extremities ?            -antiplatelet therapy: none ?3. Postoperative pain and hx of Fibromyalgia: continue Tylenol, Norco, Robaxin 750 mg PRN ? 3/31- chronic pain- but pain from cerebellar surgery is good- con't regimen ?4. Mood: LCSW to evaluate and provide emotional support ?            -antipsychotic agents: Seroquel ?5. Neuropsych: This patient is capable of making decisions on her own behalf. ?6. Skin/Wound Care: Routine skin care checks ?7. Fluids/Electrolytes/Nutrition: Routine I's and O's and follow-up chemistries ?8: Cerebellar tumor s/p craniectomy: continue Trileptal 900 mg q HS ?            --meclizine prn  ? 3/31- had some shaking earlier today- could have been tremors?- monitor closely.  ?9: Hypertension: increase amlodipine to '10mg'$  daily ? 3/30- BP is well controlled with increased meds- con't regimen ?10: Hyperlipidemia: continue atorvastatin 10 mg daily ?11: Seasonal allergies: continue Xyzal, Astelin, Flonase prn ?12: Asthma: continue Singulair, Breo Ellipta, Proventil ?13: Fibromyalgia: pain management as above ?14: Migraine headache: Maxalt prn ?15: Insomnia: continue melatonin ?16: Sleep apnea: no records of CPAP/BiPap ?46: DM-2: continue CBG monitoring and carb mod diet ?            --continue semaglutide Nixon every Sunday ? 3/31- Cbgs controlled adequately- con't regimen ?18: Neurogenic bladder: no records ?19: vitamin D deficiency: no supplements. Check levels ? 20. Leukocytosis ? 3/30- WBC 18.6- likely due to Dexamethasone- will monitor- afebrile and no other Sx's of  infection ? 3/31- will recheck Monday and monitor for S'S's of infection ?21. Steroid dosing ? 3/31- will check into if NSU wanted a steroid wean at all . ? ? ?I spent a total of  35   minutes on total care today- >50% coordination of care- due to d/w PA about steroid wean and IPOC ? ?LOS: ?2 days ?A FACE TO FACE EVALUATION WAS PERFORMED ? ?Virginia Luna ?11/17/2021, 11:30 AM  ? ? ? ?

## 2021-11-17 NOTE — IPOC Note (Addendum)
Overall Plan of Care (IPOC) ?Patient Details ?Name: Virginia Luna ?MRN: [id number] ?DOB: [birth year] ? ?Admitting Diagnosis: Cerebellar tumor ([facility]) ? ?Hospital Problems: Principal Problem: ?  Cerebellar tumor ([facility]) ?Active Problems: ?  Cerebellar mass ? ? ? ? Functional Problem List: ?Nursing Bladder, Bowel, Edema, Endurance, Motor, Nutrition, Pain, Safety, Skin Integrity  ?PT Balance, Safety, Behavior, Sensory, Edema, Skin Integrity, Endurance, Motor, Nutrition, Pain, Perception  ?OT Cognition, Balance, Endurance, Pain, Safety, Vision  ?SLP Cognition  ?TR    ?    ? Basic ADL?s: ?OT Grooming, Bathing, Dressing, Toileting  ? ?  Advanced  ADL?s: ?OT Simple Meal Preparation  ?   ?Transfers: ?PT Bed Mobility, Bed to Chair, Car, Furniture, Floor  ?OT Toilet, Tub/Shower  ? ?  Locomotion: ?PT Ambulation, Stairs  ? ?  Additional Impairments: ?OT None  ?SLP Social Cognition ?  ?Problem Solving, Memory, Attention, Awareness  ?TR    ? ? ?Anticipated Outcomes ?Item Anticipated Outcome  ?Self Feeding Independent  ?Swallowing ?   ?  ?Basic self-care ? Mod I  ?Toileting ? Mod I ?  ?Bathroom Transfers Mod I  ?Bowel/Bladder ? supervision  ?Transfers ? mod-I using LRAD  ?Locomotion ? supervision using LRAD  ?Communication ? Supervision  ?Cognition ? Min A  ?Pain ? < 3  ?Safety/Judgment ? supervision  ? ?Therapy Plan: ?PT Intensity: Minimum of 1-2 x/day ,45 to 90 minutes ?PT Frequency: 5 out of 7 days ?PT Duration Estimated Length of Stay: ~7-10 days ?OT Intensity: Minimum of 1-2 x/day, 45 to 90 minutes ?OT Frequency: 5 out of 7 days ?OT Duration/Estimated Length of Stay: 7-10 days ?SLP Intensity: Minumum of 1-2 x/day, 30 to 90 minutes ?SLP Frequency: 3 to 5 out of 7 days ?SLP Duration/Estimated Length of Stay: 7-10 days  ? ?Due to the current state of emergency, patients may not be receiving their 3-hours of Medicare-mandated therapy. ? ? Team Interventions: ?Nursing Interventions Patient/Family Education, Bladder Management, Bowel  Management, Disease Management/Prevention, Pain Management, Skin Care/Wound Management, Discharge Planning, Psychosocial Support  ?PT interventions Ambulation/gait training, Community reintegration, DME/adaptive equipment instruction, Neuromuscular re-education, Psychosocial support, Stair training, UE/LE Strength taining/ROM, Wheelchair propulsion/positioning, Training and development officer, Discharge planning, Functional electrical stimulation, Pain management, Skin care/wound management, Therapeutic Activities, UE/LE Coordination activities, Cognitive remediation/compensation, Disease management/prevention, Functional mobility training, Patient/family education, Splinting/orthotics, Therapeutic Exercise, Visual/perceptual remediation/compensation  ?OT Interventions Balance/vestibular training, Discharge planning, Pain management, Self Care/advanced ADL retraining, Therapeutic Activities, UE/LE Coordination activities, Functional mobility training, Patient/family education, Therapeutic Exercise, UE/LE Strength taining/ROM, Wheelchair propulsion/positioning, DME/adaptive equipment instruction  ?SLP Interventions Cognitive remediation/compensation, Cueing hierarchy, Dysphagia/aspiration precaution training, Functional tasks, Internal/external aids, Medication managment, Patient/family education, Therapeutic Activities  ?TR Interventions    ?SW/CM Interventions Discharge Planning, Psychosocial Support, Patient/Family Education  ? ?Barriers to Discharge ?MD  Medical stability, Home enviroment access/loayout, Wound care, Lack of/limited family support, Weight, and Weight bearing restrictions  ?Nursing Decreased caregiver support, Home environment access/layout, Incontinence, Wound Care, Lack of/limited family support, Weight ?1 level, 2 steps, right rail. Godsister and dauther to stay with patient.  ?PT Decreased caregiver support ?   ?OT Lack of/limited family support, Home environment access/layout ?   ?SLP   ?   ?SW    ?   ? ?Team Discharge Planning: ?Destination: PT-Home ,OT- Home , SLP-Home ?Projected Follow-up: PT-Outpatient PT, 24 hour supervision/assistance, OT-  Home health OT, SLP-Home Health SLP, Outpatient SLP, 24 hour supervision/assistance ?Projected Equipment Needs: PT-To be determined, OT- Tub/shower bench, To be determined, SLP-None recommended by SLP ?Equipment Details: PT- ,  OT-  ?Patient/family involved in discharge planning: PT- Patient,  OT-Patient, SLP-Patient ? ?MD ELOS: 7-10 days ?Medical Rehab Prognosis:  Good ?Assessment: The patient has been admitted for CIR therapies with the diagnosis of cerebellar tumor. The team will be addressing functional mobility, strength, stamina, balance, safety, adaptive techniques and equipment, self-care, bowel and bladder mgt, patient and caregiver education, weaning steroids. Goals have been set at mod I to superviison- SLP min A. Anticipated discharge destination is home. ? ?Due to the current state of emergency, patients may not be receiving their 3 hours per day of Medicare-mandated therapy.  ? ? ? ? ? ?See Team Conference Notes for weekly updates to the plan of care ? ?

## 2021-11-17 NOTE — Progress Notes (Signed)
Physical Therapy Session Note ? ?Patient Details  ?Name: Virginia Luna ?MRN: [id number] ?Date of Birth: Jan 25, 1961 ? ?Today's Date: 11/17/2021 ?PT Individual Time: 6578-4696 ?PT Individual Time Calculation (min): 60 min  ? ?Short Term Goals: ?Week 1:  PT Short Term Goal 1 (Week 1): = to LTGs based on ELOS ? ?Skilled Therapeutic Interventions/Progress Updates: Pt presented in recliner agreeable to therapy. Pt denies pain throughout session. Pt Pt performed Sit to stand from recliner with CGA and ambulated to MW nsg station ~66f with RW and CGA. Pt noted to ambulated with slow deliberate movements but maintained within RW and good erect posture. Pt transported remaining distance to rehab gym and participated in Sit to stand 2 x 5 for BLE strengthening. Pt also participated in static reaching activities placing and removing clothespins to/from basketball net. Pt also performed toe taps to 4in step with RW 2 x 10. Pt initially with difficulty having L foot reach top of 4in block but was able to perform with improved technique on second bout. Participated in 2 bouts of horseshoes for dynamic balance, first bout performed on level tile with second bout performed on Airex. Pt was able to demonstrate good static balance on level tile without AD but required intermittent UE support on RW during second bout on Airex with noted increased ankle strategies noted. Pt transported back to room at end of session and remained in w/c while NT was changing sheets and nsg arriving to administer meds.  ?   ? ?Therapy Documentation ?Precautions:  ?Precautions ?Precautions: Fall ?Restrictions ?Weight Bearing Restrictions: No ?General: ?  ?Vital Signs: ?Therapy Vitals ?Temp: 98.7 ?F (37.1 ?C) ?Pulse Rate: 75 ?Resp: 20 ?BP: (!) 151/82 ?Oxygen Therapy ?SpO2: 100 % ?Pain: ?Pain Assessment ?Pain Scale: 0-10 ?Pain Score: 8  ?Pain Type: Acute pain ?Pain Location: Neck ?Pain Orientation: Left ?Pain Onset: On-going ?Pain Intervention(s):  Medication (See eMAR) ?Mobility: ?  ?Locomotion : ?   ?Trunk/Postural Assessment : ?   ?Balance: ?  ?Exercises: ?  ?Other Treatments:   ? ? ? ?Therapy/Group: Individual Therapy ? ?Audwin Semper ?11/17/2021, 4:19 PM  ?

## 2021-11-17 NOTE — Progress Notes (Signed)
Inpatient Rehabilitation Care Coordinator ?Assessment and Plan ?Patient Details  ?Name: Virginia Luna ?MRN: [id number] ?Date of Birth: January 28, 1961 ? ?Today's Date: 11/17/2021 ? ?Hospital Problems: Principal Problem: ?  Cerebellar tumor ([place]) ?Active Problems: ?  Cerebellar mass ? ?Past Medical History:  ?Past Medical History:  ?Diagnosis Date  ? Allergies   ? Anemia   ? Arthritis   ? Asthma   ? Back pain   ? Chronic pain   ? Complication of anesthesia   ? woke up during colonoscopy  ? Diabetes ([place])   ? Diabetes mellitus without complication ([place])   ? Edema, lower extremity   ? Fibroid   ? Fibromyalgia   ? Chronic  ? GERD (gastroesophageal reflux disease)   ? Headache   ? migraines  ? High blood pressure   ? History of hiatal hernia   ? History of stomach ulcers   ? IBS (irritable bowel syndrome)   ? Joint pain   ? Sleep apnea   ? did use cpap-lost 25lb-says she does not need it   NO CPAP  ? Wears contact lenses   ? ?Past Surgical History:  ?Past Surgical History:  ?Procedure Laterality Date  ? BREAST BIOPSY    ? BREAST EXCISIONAL BIOPSY    ? BREAST LUMPECTOMY WITH RADIOACTIVE SEED LOCALIZATION Bilateral 11/24/2014  ? Procedure: BILATERAL BREAST LUMPECTOMY WITH RADIOACTIVE SEED LOCALIZATION;  Surgeon: [name], MD;  Location: [facility];  Service: General;  Laterality: Bilateral;  ? CHOLECYSTECTOMY    ? COLONOSCOPY    ? DILATION AND CURETTAGE OF UTERUS    ? ESOPHAGOGASTRODUODENOSCOPY (EGD) WITH PROPOFOL N/A 07/23/2016  ? Procedure: ESOPHAGOGASTRODUODENOSCOPY (EGD) WITH PROPOFOL;  Surgeon: Laurence Spates, MD;  Location: [facility];  Service: Endoscopy;  Laterality: N/A;  ? ESOPHAGOGASTRODUODENOSCOPY (EGD) WITH PROPOFOL N/A 06/11/2018  ? Procedure: ESOPHAGOGASTRODUODENOSCOPY (EGD) WITH PROPOFOL;  Surgeon: Laurence Spates, MD;  Location: [facility];  Service: Endoscopy;  Laterality: N/A;  ? LUMBAR LAMINECTOMY  2010  ? ORIF WRIST FRACTURE Right 11/24/2020  ? Procedure: OPEN REDUCTION INTERNAL FIXATION  RIGHT WRIST FRACTURE;  Surgeon: Renette Butters, MD;  Location: [facility];  Service: Orthopedics;  Laterality: Right;  ? UMBILICAL HERNIA REPAIR    ? age 77  ? UPPER GI ENDOSCOPY    ? ?Social History:  reports that she has never smoked. She has never used smokeless tobacco. She reports that she does not drink alcohol and does not use drugs. ? ?Family / Support Systems ?Marital Status: Single ?Patient Roles: Partner ?Spouse/Significant Other: Kennith Center (boyfriend) [phone] ?Children: None ?Other Supports: pt god sister and her dtr; pt sister Angela Nevin PRN ?Anticipated Caregiver: Boyfriend Kennith Center ?Ability/Limitations of Caregiver: None reported as pt boyfriend Kennith Center will provide 24/7 care. Pt sister Angela Nevin to have weightloss surgery. Also support from goddaughter ?Caregiver Availability: 24/7 ?Family Dynamics: Pt lives with her boyfriend Kennith Center ? 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 ?Education: some college ?Health Literacy - How often do you need to have someone help you when you read instructions, pamphlets, or other written material from your doctor or pharmacy?: Never ?Writes: Yes ?Employment Status: Disabled ?Date Retired/Disabled/Unemployed: Unknown when SSDI started. Pt unable to remember. ?Legal History/Current Legal Issues: Denies ?Guardian/Conservator: N/A  ? ?Abuse/Neglect ?Abuse/Neglect Assessment Can Be Completed: Yes ?Physical Abuse: Denies ?Verbal Abuse: Denies ?Sexual Abuse: Denies ?Exploitation of patient/patient's resources: Denies ?Self-Neglect: Denies ? ?Patient  response to: ?Social Isolation - How often do you feel lonely or isolated from those around you?: Never ? ?Emotional Status ?Pt's affect, behavior and adjustment status: Pt slightly depressed at time of visit as she is trying to cope with having a tumor and the transition  that has occurred with navigating through several health systems. ?Recent Psychosocial Issues: Denies ?Psychiatric History: Pt admits Dr. Clovis Pu provides sleep medication, and Dr. Juleen China follows for maintenance. ?Substance Abuse History: Denies ? ?Patient / Family Perceptions, Expectations & Goals ?Pt/Family understanding of illness & functional limitations: Pt has general understanding of care needs ?Premorbid pt/family roles/activities: Independent ?Anticipated changes in roles/activities/participation: Assistance with ADLs/IADLs ?Pt/family expectations/goals: pt goal is work on learning how to modify care needs. ? ?[employer] ?Community Agencies: None ?Premorbid Home Care/DME Agencies: None ?Transportation available at discharge: TBD ?Is the patient able to respond to transportation needs?: Yes ?In the past 12 months, has lack of transportation kept you from medical appointments or from getting medications?: No ?In the past 12 months, has lack of transportation kept you from meetings, work, or from getting things needed for daily living?: No ?Resource referrals recommended: Neuropsychology ? ?Discharge Planning ?Living Arrangements: Spouse/significant other ?Support Systems: Spouse/significant other, Other relatives, Friends/neighbors ?Type of Residence: Private residence ?Insurance Resources: Multimedia programmer (specify) ([employer] Medicare) ?Financial Resources: SSD ?Financial Screen Referred: No ?Living Expenses: Own ?Money Management: Patient ?Does the patient have any problems obtaining your medications?: No ?Home Management: Pt reports boyfriend Kennith Center manages  all homecare needs ?Patient/Family Preliminary Plans: No changes ?Care Coordinator Anticipated Follow Up Needs: HH/OP ?DC Planning Additional Notes/Comments: Fam edu schedule for Tuesday (4/5) 1pm-4pm with her sister and Wed (4/6) 9am-12pm with boyfriend Kennith Center. ?Expected length of stay: 7-10 days ? ?Clinical Impression ?SW met with pt at  bedside to introduce self, explain role, and discuss discharge process. Pt is not a English as a second language teacher. No HCPOA- would like chaplain for education on advanced care directive. No DME. Pt boyfriend Kennith Center called while SW in room, SW introduced self. Fam edu scheduled on Wed 9am-12pm with him. Pt aware fam edu also scheduled on Tues 1pm-4pm with her sister. Pt reports primary contact is Kennith Center moving forward.  ? ?Rana Snare ?11/17/2021, 4:06 PM ? ?  ?

## 2021-11-18 DIAGNOSIS — D496 Neoplasm of unspecified behavior of brain: Secondary | ICD-10-CM | POA: Diagnosis not present

## 2021-11-18 LAB — GLUCOSE, CAPILLARY
Glucose-Capillary: 102 mg/dL — ABNORMAL HIGH (ref 70–99)
Glucose-Capillary: 123 mg/dL — ABNORMAL HIGH (ref 70–99)
Glucose-Capillary: 165 mg/dL — ABNORMAL HIGH (ref 70–99)
Glucose-Capillary: 158 mg/dL — ABNORMAL HIGH (ref 70–99)

## 2021-11-18 NOTE — Progress Notes (Signed)
?                                                       PROGRESS NOTE ? ? ?Subjective/Complaints: ? ?No issues overnite , still some post op HA  ?ROS: ? ?Pt denies SOB, abd pain, CP, N/V/C/D, and vision changes ? ? ?Objective: ?  ?No results found. ?Recent Labs  ?  11/16/21 ?0093  ?WBC 18.6*  ?HGB 10.7*  ?HCT 33.4*  ?PLT 513*  ? ? ?Recent Labs  ?  11/16/21 ?8182  ?NA 139  ?K 3.9  ?CL 102  ?CO2 29  ?GLUCOSE 141*  ?BUN 13  ?CREATININE 0.49  ?CALCIUM 8.9  ? ? ? ?Intake/Output Summary (Last 24 hours) at 11/18/2021 0711 ?Last data filed at 11/17/2021 9937 ?Gross per 24 hour  ?Intake 660 ml  ?Output --  ?Net 660 ml  ? ?  ? ?  ? ?Physical Exam: ?Vital Signs ?Blood pressure (!) 144/78, pulse 69, temperature 98.1 ?F (36.7 ?C), temperature source Oral, resp. rate 18, height '5\' 1"'$  (1.549 m), weight 119.4 kg, SpO2 100 %. ? ? ?General: No acute distress ?Mood and affect are appropriate ?Heart: Regular rate and rhythm no rubs murmurs or extra sounds ?Lungs: Clear to auscultation, breathing unlabored, no rales or wheezes ?Abdomen: Positive bowel sounds, soft nontender to palpation, nondistended ?Extremities: No clubbing, cyanosis, or edema ?Skin: No evidence of breakdown, no evidence of rash ? ? ?Neurological: alert- remembers me from yesterday, but still confused about me from clinic ?Skin: incision c/d/i ?Neuro/ Musculoskeletal: Alert and oriented x4. 5/5 strength. Sensation intact. Cervical myofascial tenderness. Tenderness bilateral knees.  ? ?Assessment/Plan: ?1. Functional deficits which require 3+ hours per day of interdisciplinary therapy in a comprehensive inpatient rehab setting. ?Physiatrist is providing close team supervision and 24 hour management of active medical problems listed below. ?Physiatrist and rehab team continue to assess barriers to discharge/monitor patient progress toward functional and medical goals ? ?Care Tool: ? ?Bathing ?   ?Body parts bathed by patient: Right arm, Left arm, Chest, Abdomen, Front  perineal area, Buttocks, Right upper leg, Left upper leg, Face  ? Body parts bathed by helper: Right lower leg, Left lower leg ?  ?  ?Bathing assist Assist Level: Minimal Assistance - Patient > 75% ?  ?  ?Upper Body Dressing/Undressing ?Upper body dressing   ?What is the patient wearing?: Pull over shirt ?   ?Upper body assist Assist Level: Supervision/Verbal cueing ?   ?Lower Body Dressing/Undressing ?Lower body dressing ? ? ?   ?What is the patient wearing?: Underwear/pull up ? ?  ? ?Lower body assist Assist for lower body dressing: Contact Guard/Touching assist ?   ? ?Toileting ?Toileting    ?Toileting assist Assist for toileting: Contact Guard/Touching assist ?  ?  ?Transfers ?Chair/bed transfer ? ?Transfers assist ? Chair/bed transfer activity did not occur: N/A ? ?Chair/bed transfer assist level: Minimal Assistance - Patient > 75% ?  ?  ?Locomotion ?Ambulation ? ? ?Ambulation assist ? ?   ? ?Assist level: Minimal Assistance - Patient > 75% ?Assistive device: Walker-rolling ?Max distance: patient walked to and from the bathroom  ? ?Walk 10 feet activity ? ? ?Assist ?   ? ?Assist level: Minimal Assistance - Patient > 75% ?   ? ?Walk 50 feet activity ? ? ?Assist   ? ?  Assist level: Minimal Assistance - Patient > 75% ?   ? ? ?Walk 150 feet activity ? ? ?Assist Walk 150 feet activity did not occur: Safety/medical concerns ? ?  ?  ?  ? ?Walk 10 feet on uneven surface  ?activity ? ? ?Assist Walk 10 feet on uneven surfaces activity did not occur: Safety/medical concerns ? ? ?  ?   ? ?Wheelchair ? ? ? ? ?Assist Is the patient using a wheelchair?: Yes (for energy conservation) ?Type of Wheelchair: Manual ?  ? ?Wheelchair assist level: Dependent - Patient 0% ?   ? ? ?Wheelchair 50 feet with 2 turns activity ? ? ? ?Assist ? ?  ?  ? ? ?Assist Level: Dependent - Patient 0%  ? ?Wheelchair 150 feet activity  ? ? ? ?Assist ?   ? ? ?Assist Level: Dependent - Patient 0%  ? ?Blood pressure (!) 144/78, pulse 69, temperature 98.1 ?F  (36.7 ?C), temperature source Oral, resp. rate 18, height '5\' 1"'$  (1.549 m), weight 119.4 kg, SpO2 100 %. ? ?Medical Problem List and Plan: ?1. 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            -patient may shower but incision must be covered ?            -ELOS/Goals: 10-14 days S ?            Continue CIR- PT, OT and SLP ? ?2.  Antithrombotics: ?-DVT/anticoagulation:  Mechanical: Sequential compression devices, below knee Bilateral lower extremities ?            -antiplatelet therapy: none ?3. Postoperative pain and hx of Fibromyalgia: continue Tylenol, Norco, Robaxin 750 mg PRN ? ?4. Mood: LCSW to evaluate and provide emotional support ?            -antipsychotic agents: Seroquel ?5. Neuropsych: This patient is capable of making decisions on her own behalf. ?6. Skin/Wound Care: Routine skin care checks ?7. Fluids/Electrolytes/Nutrition: Routine I's and O's and follow-up chemistries ?8: Cerebellar tumor s/p craniectomy: continue Trileptal 900 mg q HS ?            --meclizine prn  ? 3/31- had some shaking earlier today- could have been tremors?- monitor closely.  ?9: Hypertension: increase amlodipine to '10mg'$  daily ? 3/30- BP is well controlled with increased meds- con't regimen ?10: Hyperlipidemia: continue atorvastatin 10 mg daily ?11: Seasonal allergies: continue Xyzal, Astelin, Flonase prn ?12: Asthma: continue Singulair, Breo Ellipta, Proventil ?13: Fibromyalgia: pain management as above ?14: Migraine headache: Maxalt prn ?15: Insomnia: continue melatonin ?16: Sleep apnea: no records of CPAP/BiPap ?40: DM-2: continue CBG monitoring and carb mod diet ?            --continue semaglutide [place] every Sunday ? CBG (last 3)  ?Recent Labs  ?  11/17/21 ?1709 11/17/21 ?2043 11/18/21 ?0606  ?GLUCAP 136* 190* 102*  ? Controlled 4/1 ?18: Neurogenic bladder: no records ?19: vitamin D deficiency: no supplements. Check levels ? 20. Leukocytosis ? 3/30- WBC  18.6- likely due to Dexamethasone- will monitor- afebrile and no other Sx's of infection ? 3/31- will recheck Monday and monitor for S'S's of infection ?21. Steroid dosing ? 3/31- will check into if NSU wanted a steroid wean at all . ? ? ? ?LOS: ?3 days ?A FACE TO FACE EVALUATION WAS PERFORMED ? ?Luanna Salk Raymondo Garcialopez ?11/18/2021, 7:11 AM  ? ? ? ?

## 2021-11-18 NOTE — Plan of Care (Signed)
?  Problem: Consults ?Goal: RH BRAIN INJURY PATIENT EDUCATION ?Description: Description: See Patient Education module for eduction specifics ?Outcome: Progressing ?Goal: Skin Care Protocol Initiated - if Braden Score 18 or less ?Description: If consults are not indicated, leave blank or document N/A ?Outcome: Progressing ?Goal: Diabetes Guidelines if Diabetic/Glucose > 140 ?Description: If diabetic or lab glucose is > 140 mg/dl - Initiate Diabetes/Hyperglycemia Guidelines & Document Interventions  ?Outcome: Progressing ?  ?Problem: RH BOWEL ELIMINATION ?Goal: RH STG MANAGE BOWEL WITH ASSISTANCE ?Description: STG Manage Bowel with Supervision Assistance. ?Outcome: Progressing ?Goal: RH STG MANAGE BOWEL W/MEDICATION W/ASSISTANCE ?Description: STG Manage Bowel with Medication with Supervision Assistance. ?Outcome: Progressing ?  ?Problem: RH BLADDER ELIMINATION ?Goal: RH STG MANAGE BLADDER WITH ASSISTANCE ?Description: STG Manage Bladder With Supervision Assistance ?Outcome: Progressing ?Goal: RH STG MANAGE BLADDER WITH MEDICATION WITH ASSISTANCE ?Description: STG Manage Bladder With Medication With Supervision Assistance. ?Outcome: Progressing ?  ?Problem: RH SKIN INTEGRITY ?Goal: RH STG MAINTAIN SKIN INTEGRITY WITH ASSISTANCE ?Description: STG Maintain Skin Integrity With Supervision Assistance. ?Outcome: Progressing ?Goal: RH STG ABLE TO PERFORM INCISION/WOUND CARE W/ASSISTANCE ?Description: STG Able To Perform Incision/Wound Care With Supervision Assistance. ?Outcome: Progressing ?  ?Problem: RH SAFETY ?Goal: RH STG ADHERE TO SAFETY PRECAUTIONS W/ASSISTANCE/DEVICE ?Description: STG Adhere to Safety Precautions With cues and reminders. ?Outcome: Progressing ?Goal: RH STG DECREASED RISK OF FALL WITH ASSISTANCE ?Description: STG Decreased Risk of Fall With supervision Assistance. ?Outcome: Progressing ?  ?Problem: RH COGNITION-NURSING ?Goal: RH STG USES MEMORY AIDS/STRATEGIES W/ASSIST TO PROBLEM SOLVE ?Description: STG  Uses Memory Aids/Strategies With supervision Assistance to Problem Solve. ?Outcome: Progressing ?Goal: RH STG ANTICIPATES NEEDS/CALLS FOR ASSIST W/ASSIST/CUES ?Description: STG Anticipates Needs/Calls for Assist With Cues and reminders. ?Outcome: Progressing ?  ?Problem: RH PAIN MANAGEMENT ?Goal: RH STG PAIN MANAGED AT OR BELOW PT'S PAIN GOAL ?Description: < 3 on a 0-10 pain scale. ?Outcome: Progressing ?  ?Problem: RH KNOWLEDGE DEFICIT BRAIN INJURY ?Goal: RH STG INCREASE KNOWLEDGE OF SELF CARE AFTER BRAIN INJURY ?Description: Patient will demonstrate knowledge of self-care, medication/pain management, skin/wound care with educational materials and handouts provided by staff independently at discharge. ?Outcome: Progressing ?  ?

## 2021-11-18 NOTE — Progress Notes (Signed)
Speech Language Pathology Daily Session Note ? ?Patient Details  ?Name: Virginia Luna ?MRN: [id number] ?Date of Birth: [birth year] ? ?Today's Date: 11/18/2021 ?SLP Individual Time: 5009-3818 ?SLP Individual Time Calculation (min): 56 min ? ?Short Term Goals: ?Week 1: SLP Short Term Goal 1 (Week 1): Pt will utilize 2 out of 4 memory strategies to recall functional information re: iADL's given Min verbal A. ?SLP Short Term Goal 2 (Week 1): Pt will complete basic + functional problem-solving tasks re: pill organizer, money, and scheduling to prepare for discharge given Min A for use of attention and executive functioning strategies. ?SLP Short Term Goal 3 (Week 1): Pt will communicate wants/needs, ideas, and thoughts with 90% intelligibility given Min A for use of pacing and over-articulation strategies. ? ?Skilled Therapeutic Interventions:Skilled ST services focused on cognitive skills. SLP facilitated mildly complex problem solving, error awareness and recall skills in calendar task, pt required supervision A verbal cues fade to mod I, further impacted by poor writing legibility. Pt verbalized medication system at home utilizing Mychart and [facility] service to have prepackaged medications delivered at home. Pt was able to login into Mychart and show SLP lengthily list of medications as well as general changes to medications made during hospitalization. Pt expressed desire to not work on medications at this date. SLP recommends reviewing novel verse baseline medication only. Pt demonstrates good recall of medical events/history, demonstrating 85% speech intelligibility with min A verbal cues to overt articulate and slow rate. Pt was left in room with call bell within reach and bed alarm set. SLP recommends to continue skilled services. ?   ? ?Pain ?Pain Assessment ?Pain Scale: 0-10 ?Pain Score: 0-No pain ?Faces Pain Scale: Hurts a little bit ?Pain Type: Acute pain ?Pain Location: Head ?Pain Orientation:  Left;Posterior ?Pain Radiating Towards: posterior, left neck ?Pain Descriptors / Indicators: Aching ?Pain Frequency: Constant ?Pain Onset: On-going ?Patients Stated Pain Goal: 3 ?Pain Intervention(s): Medication (See eMAR) ?Multiple Pain Sites: Yes ?2nd Pain Site ?Pain Score: 7 ?Pain Type: Acute pain ?Pain Location: Shoulder ?Pain Orientation: Posterior;Left ?Pain Radiating Towards: left shoulder ?Pain Descriptors / Indicators: Aching ?Pain Frequency: Constant ?Pain Onset: On-going ?Patient's Stated Pain Goal: 3 ?Pain Intervention(s): Medication (See eMAR) ? ?Therapy/Group: Individual Therapy ? ?Milanna Kozlov ?11/18/2021, 3:46 PM ?

## 2021-11-18 NOTE — Progress Notes (Signed)
Occupational Therapy Session Note ? ?Patient Details  ?Name: Virginia Luna ?MRN: [id number] ?Date of Birth: 1961-04-05 ? ?Today's Date: 11/18/2021 ?OT Individual Time: 1001-1059 ?OT Individual Time Calculation (min): 58 min  ? ? ?Short Term Goals: ?Week 1:  OT Short Term Goal 1 (Week 1): STG = LTG due to ELOS ? ?Skilled Therapeutic Interventions/Progress Updates:  ?   ?Pt received in bed with generalized fibromyalgia pain and mild nausea building thorughotu session with medication provided towards end of session  ?ADL: ?Pt completes ADL at overall CGA Level with RW. Skilled interventions include:VC for feet inside RW for transitional movements, RW management for toilet and tub bench transfers, A to wash back to ensure no water on incision (looks well healed!). Pt with good carryover of every conservation strategies donning all LB clothing prior to sit to stand to pull everything past hips in shower. Cueing to use stool to don L sock with S. Pt able to don B shoes with setup. Grooming at sink seated with set up.  ? ?Pt left at end of session in w/c awaiting PT arrival. Pt verbalized not getting up/not wanting belt alarm since PT to be there <5 min ? ? ?Therapy Documentation ?Precautions:  ?Precautions ?Precautions: Fall ?Restrictions ?Weight Bearing Restrictions: No ?General: ?  ?Therapy/Group: Individual Therapy ? ?Lowella Dell Xyler Terpening ?11/18/2021, 6:56 AM ?

## 2021-11-18 NOTE — Progress Notes (Signed)
Physical Therapy Session Note ? ?Patient Details  ?Name: Virginia Luna ?MRN: [id number] ?Date of Birth: April 11, 1961 ? ?Today's Date: 11/18/2021 ?PT Individual Time: 3958-4417 ?PT Individual Time Calculation (min): 54 min  ? ?Short Term Goals: ?Week 1:  PT Short Term Goal 1 (Week 1): = to LTGs based on ELOS ? ?Skilled Therapeutic Interventions/Progress Updates:  ? ?Pt received sitting in WC and agreeable to PT ? ?WC mobility through hall to force use of BUE with min assist to maintain momentum x35ft cues for increased ROM as tolerated on the LUE. .  ? ?Gait training with RW 2 x 55ft with CGA from PT for safety mild L lateral lean with fatigue. Movements slow and deliberate throughout. SpO2 96% HR 82 following 1st bout of gait training.  ? ?Dynamic balance training to perform lateral and cross body reach with 1 LE elevated on 2 inch step. Performed x 15 on BUE. CGA for safety with cues for posture and lateral weight shift to the L.  ? ?Patient returned to room and left sitting in [facility] with call bell in reach and all needs met.   ? ? ? ? ?   ? ?Therapy Documentation ?Precautions:  ?Precautions ?Precautions: Fall ?Restrictions ?Weight Bearing Restrictions: No ? ? ?Pain: ?Pain Assessment ?Pain Scale: 0-10 ?Pain Score: 7  ?Faces Pain Scale: Hurts a little bit ?Pain Type: Acute pain ?Pain Location: Head ?Pain Orientation: Left;Posterior ?Pain Radiating Towards: posterior, left neck ?Pain Descriptors / Indicators: Aching ?Pain Frequency: Constant ?Pain Onset: On-going ?Patients Stated Pain Goal: 3 ?Pain Intervention(s): Medication (See eMAR) ?Multiple Pain Sites: Yes ?2nd Pain Site ?Pain Score: 7 ?Pain Type: Acute pain ?Pain Location: Shoulder ?Pain Orientation: Posterior;Left ?Pain Radiating Towards: left shoulder ?Pain Descriptors / Indicators: Aching ?Pain Frequency: Constant ?Pain Onset: On-going ?Patient's Stated Pain Goal: 3 ?Pain Intervention(s): Medication (See eMAR) ? ? ? ?Therapy/Group: Individual Therapy ? ?Lorie Phenix ?11/18/2021, 12:16 PM  ?

## 2021-11-19 LAB — GLUCOSE, CAPILLARY
Glucose-Capillary: 122 mg/dL — ABNORMAL HIGH (ref 70–99)
Glucose-Capillary: 108 mg/dL — ABNORMAL HIGH (ref 70–99)
Glucose-Capillary: 125 mg/dL — ABNORMAL HIGH (ref 70–99)
Glucose-Capillary: 173 mg/dL — ABNORMAL HIGH (ref 70–99)

## 2021-11-19 MED ORDER — ORAL CARE MOUTH RINSE
15.0000 mL | Freq: Two times a day (BID) | OROMUCOSAL | Status: DC
  Administered 2021-11-19 – 2021-11-24 (×10): 15 mL via OROMUCOSAL

## 2021-11-20 ENCOUNTER — Ambulatory Visit: Payer: Medicare HMO

## 2021-11-20 DIAGNOSIS — G9389 Other specified disorders of brain: Secondary | ICD-10-CM | POA: Diagnosis not present

## 2021-11-20 DIAGNOSIS — D496 Neoplasm of unspecified behavior of brain: Secondary | ICD-10-CM | POA: Diagnosis not present

## 2021-11-20 DIAGNOSIS — R251 Tremor, unspecified: Secondary | ICD-10-CM

## 2021-11-20 LAB — CBC WITH DIFFERENTIAL/PLATELET
Abs Immature Granulocytes: 0.3 10*3/uL — ABNORMAL HIGH (ref 0.00–0.07)
Basophils Absolute: 0 10*3/uL (ref 0.0–0.1)
Basophils Relative: 0 %
Eosinophils Absolute: 0.4 10*3/uL (ref 0.0–0.5)
Eosinophils Relative: 3 %
HCT: 29 % — ABNORMAL LOW (ref 36.0–46.0)
Hemoglobin: 9.2 g/dL — ABNORMAL LOW (ref 12.0–15.0)
Immature Granulocytes: 2 %
Lymphocytes Relative: 27 %
Lymphs Abs: 3.8 10*3/uL (ref 0.7–4.0)
MCH: 27.1 pg (ref 26.0–34.0)
MCHC: 31.7 g/dL (ref 30.0–36.0)
MCV: 85.5 fL (ref 80.0–100.0)
Monocytes Absolute: 0.9 10*3/uL (ref 0.1–1.0)
Monocytes Relative: 6 %
Neutro Abs: 8.5 10*3/uL — ABNORMAL HIGH (ref 1.7–7.7)
Neutrophils Relative %: 62 %
Platelets: 413 10*3/uL — ABNORMAL HIGH (ref 150–400)
RBC: 3.39 MIL/uL — ABNORMAL LOW (ref 3.87–5.11)
RDW: 16.9 % — ABNORMAL HIGH (ref 11.5–15.5)
WBC: 13.9 10*3/uL — ABNORMAL HIGH (ref 4.0–10.5)
nRBC: 0 % (ref 0.0–0.2)

## 2021-11-20 LAB — BASIC METABOLIC PANEL
Anion gap: 10 (ref 5–15)
BUN: 16 mg/dL (ref 6–20)
CO2: 28 mmol/L (ref 22–32)
Calcium: 8.6 mg/dL — ABNORMAL LOW (ref 8.9–10.3)
Chloride: 104 mmol/L (ref 98–111)
Creatinine, Ser: 0.72 mg/dL (ref 0.44–1.00)
GFR, Estimated: >60 mL/min (ref >60)
Glucose, Bld: 147 mg/dL — ABNORMAL HIGH (ref 70–99)
Potassium: 3 mmol/L — ABNORMAL LOW (ref 3.5–5.1)
Sodium: 142 mmol/L (ref 135–145)

## 2021-11-20 LAB — RETICULOCYTES
Immature Retic Fract: 28.7 % — ABNORMAL HIGH (ref 2.3–15.9)
RBC.: 3.99 MIL/uL (ref 3.87–5.11)
Retic Count, Absolute: 57.5 10*3/uL (ref 19.0–186.0)
Retic Ct Pct: 1.4 % (ref 0.4–3.1)

## 2021-11-20 LAB — GLUCOSE, CAPILLARY
Glucose-Capillary: 136 mg/dL — ABNORMAL HIGH (ref 70–99)
Glucose-Capillary: 161 mg/dL — ABNORMAL HIGH (ref 70–99)
Glucose-Capillary: 112 mg/dL — ABNORMAL HIGH (ref 70–99)
Glucose-Capillary: 134 mg/dL — ABNORMAL HIGH (ref 70–99)

## 2021-11-20 LAB — IRON AND TIBC
Iron: 32 ug/dL (ref 28–170)
Saturation Ratios: 9 % — ABNORMAL LOW (ref 10.4–31.8)
TIBC: 374 ug/dL (ref 250–450)
UIBC: 342 ug/dL

## 2021-11-20 LAB — VITAMIN B12: Vitamin B-12: 312 pg/mL (ref 180–914)

## 2021-11-20 LAB — LACTATE DEHYDROGENASE: LDH: 159 U/L (ref 98–192)

## 2021-11-20 LAB — FERRITIN: Ferritin: 18 ng/mL (ref 11–307)

## 2021-11-20 LAB — FOLATE: Folate: 6.2 ng/mL (ref >5.9)

## 2021-11-20 MED ORDER — POTASSIUM CHLORIDE CRYS ER 20 MEQ PO TBCR
40.0000 meq | EXTENDED_RELEASE_TABLET | Freq: Two times a day (BID) | ORAL | Status: AC
  Administered 2021-11-20 – 2021-11-21 (×2): 40 meq via ORAL
  Filled 2021-11-20 (×3): qty 2

## 2021-11-20 NOTE — Progress Notes (Signed)
Physical Therapy Session Note ? ?Patient Details  ?Name: Virginia Luna ?MRN: [id number] ?Date of Birth: 1960-09-26 ? ?Today's Date: 11/20/2021 ?PT Individual Time: 9450-3888 ?PT Individual Time Calculation (min): 70 min  ? ?Short Term Goals: ?Week 1:  PT Short Term Goal 1 (Week 1): = to LTGs based on ELOS ? ? ?Skilled Therapeutic Interventions/Progress Updates:  ?Pt received sitting upright in recliner and agreeable to therapy. Pt reports that she has constant pain d/t fibromyalgia; her current pain in mainly L sided neck pain post-surgery (not rated), but did not require intervention. CGA with RW for all transfers. CGA for toilet transfer with RW, don/doffing briefs and to perform pericare with only assistance to prepare washcloths. STS from w/c to RW with CGA. Pt was able to initiate gait training with CGA for 1 trial x100 ft, requires increased time to initiate heel strike and follow through with full foot contact when loading either LE, demonstrates step-through pattern; pt is aware of her tendency toward flexed posture and attempts to correct it with only occasional cuing. Pt reported 10/10 pain during gait training but states that this is a constant and did not feel a need to terminate activity. ? ?Pt reports impaired sensation in both feet when attempting stairs. Stairs training performed with CGA for 2 trials x4 steps without requiring rest in-between - 2HR, step-to pattern, and verbal cuing for descending with the weaker LE and for ensuring placement of full foot on step. Performed alternating step-taps with RW CGA onto colored to target neuromuscular re-ed of BLE's and address proprioception deficits. Progressed to stepping over a cane as mini hurdle, fwd/backward with visual targets and verbal cuing to maintain hip abd for wider BOS - increased time to complete activity. Pt transported in w/c back to room for time management. CGA transfer with RW from w/c to bed. Supervision for bed mobility. Pt left in  bed with HOB elevated, safety alarm active, and all needs in reach. ? ?Therapy Documentation ?Precautions:  ?Precautions ?Precautions: Fall ?Restrictions ?Weight Bearing Restrictions: No ? ?Therapy/Group: Individual Therapy ? ?Lanetta Inch ?11/20/2021, 2:15 PM  ?

## 2021-11-20 NOTE — Progress Notes (Signed)
?                                                       PROGRESS NOTE ? ? ?Subjective/Complaints: ? ?Pt reports shaking- told Neuro resident "thrashing"- but pt was specific to describe jumping of arms- during day AND during night time.  ? She reports that it limits her ability to feed herself as well as participate in therapy.  ?She's worried it will make it hard to go home, take care of herself.  ? ?LBM 2 days ago- not constipated.  ? ? ?ROS: ? ?Pt denies SOB, abd pain, CP, N/V/C/D, and vision changes ? ? ?Objective: ?  ?No results found. ?Recent Labs  ?  11/20/21 ?0622  ?WBC 13.9*  ?HGB 9.2*  ?HCT 29.0*  ?PLT 413*  ? ?Recent Labs  ?  11/20/21 ?0622  ?NA 142  ?K 3.0*  ?CL 104  ?CO2 28  ?GLUCOSE 147*  ?BUN 16  ?CREATININE 0.72  ?CALCIUM 8.6*  ? ? ?Intake/Output Summary (Last 24 hours) at 11/20/2021 1830 ?Last data filed at 11/20/2021 1346 ?Gross per 24 hour  ?Intake 600 ml  ?Output --  ?Net 600 ml  ?  ? ?  ? ?Physical Exam: ?Vital Signs ?Blood pressure 128/71, pulse 93, temperature 98.6 ?F (37 ?C), temperature source Oral, resp. rate 19, height '5\' 1"'$  (1.549 m), weight 119.4 kg, SpO2 98 %. ? ? ? ?General: awake, alert, appropriate, laying supine in bed; NAD ?HENT: conjugate gaze; oropharynx moist ?CV: regular rate; no JVD ?Pulmonary: CTA B/L; no W/R/R- good air movement ?GI: soft, NT, ND, (+)BS ?Psychiatric: appropriate- anxious ?Neurological: poor historian, but alert- notable for myoclonus of LUE with movement or rest of LUE.  ?Neurological: alert- remembers me from yesterday, but still confused about me from clinic ?Skin: incision c/d/i ?Neuro/ Musculoskeletal: Alert and oriented x4. 5/5 strength. Sensation intact. Cervical myofascial tenderness. Tenderness bilateral knees.  ? ?Assessment/Plan: ?1. Functional deficits which require 3+ hours per day of interdisciplinary therapy in a comprehensive inpatient rehab setting. ?Physiatrist is providing close team supervision and 24 hour management of active medical  problems listed below. ?Physiatrist and rehab team continue to assess barriers to discharge/monitor patient progress toward functional and medical goals ? ?Care Tool: ? ?Bathing ?   ?Body parts bathed by patient: Right arm, Left arm, Chest, Abdomen, Front perineal area, Buttocks, Right upper leg, Left upper leg, Face  ? Body parts bathed by helper: Right lower leg, Left lower leg ?  ?  ?Bathing assist Assist Level: Minimal Assistance - Patient > 75% ?  ?  ?Upper Body Dressing/Undressing ?Upper body dressing   ?What is the patient wearing?: Pull over shirt ?   ?Upper body assist Assist Level: Supervision/Verbal cueing ?   ?Lower Body Dressing/Undressing ?Lower body dressing ? ? ?   ?What is the patient wearing?: Underwear/pull up, Pants ? ?  ? ?Lower body assist Assist for lower body dressing: Supervision/Verbal cueing ?   ? ?Toileting ?Toileting    ?Toileting assist Assist for toileting: Supervision/Verbal cueing ?  ?  ?Transfers ?Chair/bed transfer ? ?Transfers assist ? Chair/bed transfer activity did not occur: N/A ? ?Chair/bed transfer assist level: Contact Guard/Touching assist ?Chair/bed transfer assistive device: Walker ?  ?Locomotion ?Ambulation ? ? ?Ambulation assist ? ?   ? ?Assist level: Contact Guard/Touching assist ?Assistive  device: Walker-rolling ?Max distance: 131f  ? ?Walk 10 feet activity ? ? ?Assist ?   ? ?Assist level: Contact Guard/Touching assist ?Assistive device: Walker-rolling  ? ?Walk 50 feet activity ? ? ?Assist   ? ?Assist level: Minimal Assistance - Patient > 75% ?   ? ? ?Walk 150 feet activity ? ? ?Assist Walk 150 feet activity did not occur: N/A ? ?  ?  ?  ? ?Walk 10 feet on uneven surface  ?activity ? ? ?Assist Walk 10 feet on uneven surfaces activity did not occur: Safety/medical concerns ? ? ?Assist level: Contact Guard/Touching assist ?Assistive device: Walker-rolling  ? ?Wheelchair ? ? ? ? ?Assist Is the patient using a wheelchair?: Yes ?Type of Wheelchair: Manual ?Wheelchair  activity did not occur: Safety/medical concerns ? ?Wheelchair assist level: Dependent - Patient 0% ?   ? ? ?Wheelchair 50 feet with 2 turns activity ? ? ? ?Assist ? ?  ?  ? ? ?Assist Level: Dependent - Patient 0%  ? ?Wheelchair 150 feet activity  ? ? ? ?Assist ?   ? ? ?Assist Level: Dependent - Patient 0%  ? ?Blood pressure 128/71, pulse 93, temperature 98.6 ?F (37 ?C), temperature source Oral, resp. rate 19, height '5\' 1"'$  (1.549 m), weight 119.4 kg, SpO2 98 %. ? ?Medical Problem List and Plan: ?1. 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            -patient may shower but incision must be covered ?            -ELOS/Goals: 10-14 days S ?            Continue CIR- PT, OT and SLP ?  ?2.  Antithrombotics: ?-DVT/anticoagulation:  Mechanical: Sequential compression devices, below knee Bilateral lower extremities ?            -antiplatelet therapy: none ?3. Postoperative pain and hx of Fibromyalgia: continue Tylenol, Norco, Robaxin 750 mg PRN ? 4/3- pain stable- con't regimen ?4. Mood: LCSW to evaluate and provide emotional support ?            -antipsychotic agents: Seroquel ?5. Neuropsych: This patient is capable of making decisions on her own behalf. ?6. Skin/Wound Care: Routine skin care checks ?7. Fluids/Electrolytes/Nutrition: Routine I's and O's and follow-up chemistries ?8: Cerebellar tumor s/p craniectomy: continue Trileptal 900 mg q HS ?            --meclizine prn  ? 3/31- had some shaking earlier today- could have been tremors?- monitor closely.  ? 4/3- appears to be myoclonus- called consult from Neurology ot assess- appears there was some confusion about the consult- have my chart texted physician- will double in AM ?9: Hypertension: increase amlodipine to '10mg'$  daily ? 4/3- BP controlled-con't increased meds ?10: Hyperlipidemia: continue atorvastatin 10 mg daily ?11: Seasonal allergies: continue Xyzal, Astelin, Flonase prn ?12: Asthma:  continue Singulair, Breo Ellipta, Proventil ?13: Fibromyalgia: pain management as above ?14: Migraine headache: Maxalt prn ?15: Insomnia: continue melatonin ?16: Sleep apnea: no records of CPAP/BiPap ?161 DM-2: continue CBG monitoring and carb mod diet ?            --continue semaglutide  every Sunday ? CBG (last 3)  ?Recent Labs  ?  11/20/21 ?0621 11/20/21 ?1207 11/20/21 ?1648  ?GLUCAP 136* 161* 112*  ?  4/3- BG's controlled- con't regimen ?18: Neurogenic bladder: no records ?19: vitamin D deficiency: no supplements. Check levels ? 20. Leukocytosis ?  3/30- WBC 18.6- likely due to Dexamethasone- will monitor- afebrile and no other Sx's of infection ? 3/31- will recheck Monday and monitor for S'S's of infection ? 4/3- WBC down to 13.9- afebrile- doing better with reduction in decadron ?21. Steroid dosing ? 3/31- will check into if NSU wanted a steroid wean at all . ? 4/3- weaning per NSU ? ? ?I spent a total of  42  minutes on total care today- >50% coordination of care- due to Neuro consult for myoclonus seen on exam today.  ? ? ? ?LOS: ?5 days ?A FACE TO FACE EVALUATION WAS PERFORMED ? ?Virginia Luna ?11/20/2021, 6:30 PM  ? ? ? ?

## 2021-11-20 NOTE — Progress Notes (Signed)
?   11/20/21 1515  ?Clinical Encounter Type  ?Visited With Patient  ?Visit Type Follow-up  ?Referral From Chaplain;Nurse  ?Consult/Referral To Chaplain  ? ?Chaplain followed up on Consult request for AD per Orbie Pyo. AD Education was completed and a copy was left with patient for review.  Patient was asked to notify unit nurse for follow-up and execution when/if ready.  ? ?Melody Haver, Resident Chaplain ?[phone] ?

## 2021-11-20 NOTE — Progress Notes (Signed)
Occupational Therapy Session Note ? ?Patient Details  ?Name: Virginia Luna ?MRN: [id number] ?Date of Birth: Apr 28, 1961 ? ?Today's Date: 11/20/2021 ?OT Individual Time: 0071-2197 ?OT Individual Time Calculation (min): 60 min  ? ? ?Short Term Goals: ?Week 1:  OT Short Term Goal 1 (Week 1): STG = LTG due to ELOS ? ?Skilled Therapeutic Interventions/Progress Updates:  ?  Pt resting in bed upon arrival. Pt reports that she didn't rest well during night and she "feels off" this morning. PA Katharine Look joined session and observed a portion of session. Pt requested use of toilet and amb with RW to bathroom with supervision. Toileting tasks with supervision. Pt completed bathing/dressing tasks with sit<>stand from EOB. Supervision for all tasks. Pt requires more then a reasonable amount of time to complete tasks. Pt transferred to recliner with supervision using RW. Pt remained in recliner with all needs within reach and seat alarm activated.  ? ?Therapy Documentation ?Precautions:  ?Precautions ?Precautions: Fall ?Restrictions ?Weight Bearing Restrictions: No ?  ?Pain: ? Pt reports HA and generalized discomfort; MD and PA aware. ? ? ?Therapy/Group: Individual Therapy ? ?Leroy Libman ?11/20/2021, 9:37 AM ?

## 2021-11-20 NOTE — Progress Notes (Signed)
Chaplain responded to [facility] consult. Chaplain attempted to visit with pt to provide AD education, but pt was receiving PT at the time of visit. Spiritual Care team will continue to follow. ? ?Please page as further needs arise. ? ?Donald Prose. Elyn Peers, M.Div. BCC ?Chaplain ?Pager [phone] ?Office [phone] ? ?

## 2021-11-20 NOTE — Progress Notes (Signed)
RT arrived in patient room to place pt on CPAP for the night. Pt kept saying "we'll talk". After patient was situated in bed, pt stated she has her own machine and mask and everything but she is in so much pain from her incision, she cannot wear it. RN and I both offered to apply dressing to provide some cushion and to prevent rubbing from straps and patient continued to say not tonight. I explained to her, I would go ahead and put her machine together and plug in on bedside table so that she can wear it tomorrow, unless she changes her mind and would like to wear it tonight. Mask is still in bag in closet. Pt has nasal cannula in and states she will wear that as long as she can but "it usually comes out by the end of the night from pain too". RT explained we would check on her tomorrow to place her on machine and if she changed her mind and would like to wear, to please call. RN aware. ?

## 2021-11-20 NOTE — Consult Note (Addendum)
NEUROLOGY CONSULTATION NOTE  ? ?Date of service: November 20, 2021 ?Patient Name: Virginia Luna ?MRN:  [id number] ?DOB:  1961/03/22 ?Reason for consult: "twitching and myoclonus" ?Requesting Provider: Courtney Heys, MD ? ?History of Present Illness  ?Virginia Luna is a 61 y.o. female with a PMH of left superior cerebellar vermis mass s/p left suboccipital craniotomy and tumor resection, DM2, HTN, HLD, OSA (on CPAP), migraines, chronic pain. Neurology consulted for twitching and myoclonus.  ? ?"Presented to [facility] (11/07/2021), [facility] complaining of headaches and tremors.  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  On the day of admission 11/07/2021, the patient was taken to the operating room by Dr. Recardo Evangelist of neurosurgery and underwent left suboccipital craniotomy, resection of cerebellar tumor.  She tolerated the procedure well. Postoperative MRI performed on 3/22 revealed a small area of diffusion restriction in the left cerebellum." ? ?On assessment of the patient today, she was awake, alert, reclined in bed comfortable. On exam, patient has left lower extremity tenderness that is new. Also has associated tingling distally from site of tenderness. Otherwise, denies any other new symptoms.  ?She reported that since her surgery she has had upper and lower extremity ataxia, thrashing at night, right facial droop. ?The thrashing is mainly at night. No LOC, denies pain or sensation of restless in limbs. Did note that she is supposed to be on CPAP, however refuses it and opted for intermittent nasal cannula instead.  ?Discussed anemia workup with patient,  DVT ultrasound, and CPAP usage at night. ? ? ?ROS  ? ?Constitutional Denies weight loss, fever and chills.   ?HEENT Denies changes in vision and hearing.   ?Respiratory Denies SOB and cough.   ?CV Denies palpitations and CP   ?GI Denies abdominal pain, nausea, vomiting and diarrhea.    ?GU Denies dysuria and urinary frequency.   ?MSK Denies myalgia and joint pain.   ?Skin Denies rash and pruritus.   ?Neurological Denies headache and syncope.   ?Psychiatric Denies recent changes in mood.  ? ?Past History  ? ?Past Medical History:  ?Diagnosis Date  ? Allergies   ? Anemia   ? Arthritis   ? Asthma   ? Back pain   ? Chronic pain   ? Complication of anesthesia   ? woke up during colonoscopy  ? Diabetes ([place])   ? Diabetes mellitus without complication ([place])   ? Edema, lower extremity   ? Fibroid   ? Fibromyalgia   ? Chronic  ? GERD (gastroesophageal reflux disease)   ? Headache   ? migraines  ? High blood pressure   ? History of hiatal hernia   ? History of stomach ulcers   ? IBS (irritable bowel syndrome)   ? Joint pain   ? Sleep apnea   ? did use cpap-lost 25lb-says she does not need it   NO CPAP  ? Wears contact lenses   ? ?Past Surgical History:  ?Procedure Laterality Date  ? BREAST BIOPSY    ? BREAST EXCISIONAL BIOPSY    ? BREAST LUMPECTOMY WITH RADIOACTIVE SEED LOCALIZATION Bilateral 11/24/2014  ? Procedure: BILATERAL BREAST LUMPECTOMY WITH RADIOACTIVE SEED LOCALIZATION;  Surgeon: [name], MD;  Location: [place];  Service: General;  Laterality: Bilateral;  ? CHOLECYSTECTOMY    ? COLONOSCOPY    ? DILATION AND CURETTAGE OF UTERUS    ? ESOPHAGOGASTRODUODENOSCOPY (EGD) WITH PROPOFOL N/A 07/23/2016  ? Procedure: ESOPHAGOGASTRODUODENOSCOPY (EGD) WITH  PROPOFOL;  Surgeon: Laurence Spates, MD;  Location: [facility];  Service: Endoscopy;  Laterality: N/A;  ? ESOPHAGOGASTRODUODENOSCOPY (EGD) WITH PROPOFOL N/A 06/11/2018  ? Procedure: ESOPHAGOGASTRODUODENOSCOPY (EGD) WITH PROPOFOL;  Surgeon: Laurence Spates, MD;  Location: [facility];  Service: Endoscopy;  Laterality: N/A;  ? LUMBAR LAMINECTOMY  2010  ? ORIF WRIST FRACTURE Right 11/24/2020  ? Procedure: OPEN REDUCTION INTERNAL FIXATION RIGHT WRIST FRACTURE;  Surgeon: Renette Butters, MD;  Location: [facility];  Service: Orthopedics;   Laterality: Right;  ? UMBILICAL HERNIA REPAIR    ? age 33  ? UPPER GI ENDOSCOPY    ? ?Family History  ?Problem Relation Age of Onset  ? Breast cancer Paternal Aunt   ? Breast cancer Paternal Grandmother   ? Obesity Mother   ? Diabetes Father   ? High blood pressure Father   ? Sudden death Father   ? ?Social History  ? ?Socioeconomic History  ? Marital status: Single  ?  Spouse name: Not on file  ? Number of children: Not on file  ? Years of education: Not on file  ? Highest education level: Some college, no degree  ?Occupational History  ? Occupation: disabled  ?  Comment: volunteer at daycare  ?Tobacco Use  ? Smoking status: Never  ? Smokeless tobacco: Never  ?Vaping Use  ? Vaping Use: Never used  ?Substance and Sexual Activity  ? Alcohol use: No  ? Drug use: No  ? Sexual activity: Not Currently  ?Other Topics Concern  ? Not on file  ?Social History Narrative  ? Lives w roommate  ? Right handed  ? Caffeine: 2 cups of tea a week. 2 sodas a week  ? ?Social Determinants of Health  ? ?Financial Resource Strain: Not on file  ?Food Insecurity: Not on file  ?Transportation Needs: Not on file  ?Physical Activity: Not on file  ?Stress: Not on file  ?Social Connections: Not on file  ? ?Allergies  ?Allergen Reactions  ? Rocephin [Ceftriaxone] Anaphylaxis  ? Morphine And Related Itching and Nausea And Vomiting  ?  Doesn't work  ? Prednisone Itching and Swelling  ? Sulfa Antibiotics Itching and Swelling  ? Amitriptyline Other (See Comments)  ? Penicillin G Sodium Other (See Comments)  ? ?Medications  ? ?Medications Prior to Admission  ?Medication Sig Dispense Refill Last Dose  ? albuterol (PROVENTIL HFA;VENTOLIN HFA) 108 (90 BASE) MCG/ACT inhaler Inhale 2 puffs into the lungs every 6 (six) hours as needed for wheezing or shortness of breath.   UNK  ? amLODipine (NORVASC) 5 MG tablet Take 5 mg by mouth daily.   11/15/2021  ? atorvastatin (LIPITOR) 10 MG tablet Take 10 mg by mouth at bedtime.   11/14/2021  ? azelastine (ASTELIN) 0.1  % nasal spray Place 1 spray into both nostrils 2 (two) times daily as needed for rhinitis.   UNK  ? cyclobenzaprine (FLEXERIL) 10 MG tablet Take 20 mg by mouth at bedtime.   11/06/2021  ? diphenhydrAMINE HCl, Sleep, 25 MG CAPS Take 50 tablets by mouth at bedtime as needed (sleep).   10/30/2021  ? EPINEPHrine 0.3 mg/0.3 mL IJ SOAJ injection Inject 0.3 mg into the muscle as needed for anaphylaxis.   UNK  ? fluticasone (FLONASE) 50 MCG/ACT nasal spray Place 1 spray into both nostrils daily as needed for allergies or rhinitis.   UNK  ? fluticasone furoate-vilanterol (BREO ELLIPTA) 200-25 MCG/INH AEPB Inhale 1 puff daily into the lungs.   UNK  ? HYDROcodone-acetaminophen (NORCO/VICODIN) 5-325  MG tablet Take 1 tablet by mouth 2 (two) times daily as needed for moderate pain.   0 11/06/2021  ? levocetirizine (XYZAL) 5 MG tablet Take 5 mg by mouth every evening.   UNK  ? linaclotide (LINZESS) 72 MCG capsule Take 1 capsule (72 mcg total) by mouth daily as needed (constipation). 90 capsule 0 UNK  ? meclizine (ANTIVERT) 25 MG tablet TAKE ONE TABLET BY MOUTH EVERY 8 HOURS AS NEEDED FOR DIZZINESS (Patient taking differently: Take 25 mg by mouth every 8 (eight) hours as needed for dizziness.) 30 tablet 0 Past Week  ? Melatonin 10 MG CAPS Take 10 mg by mouth at bedtime as needed (sleep).   Past Month  ? methocarbamol (ROBAXIN) 500 MG tablet TAKE ONE TABLET BY MOUTH every EIGHT hours AS NEEDED FOR MUSCLE SPASMS (Patient taking differently: Take 750 mg by mouth 3 (three) times daily as needed for muscle spasms.) 20 tablet 0 11/15/2021  ? montelukast (SINGULAIR) 10 MG tablet Take 1 tablet (10 mg total) by mouth at bedtime. 30 tablet 11 11/14/2021  ? ondansetron (ZOFRAN ODT) 4 MG disintegrating tablet Take 1 tablet (4 mg total) by mouth every 6 (six) hours as needed for nausea or vomiting. 20 tablet 0 11/05/2021  ? Oxcarbazepine (TRILEPTAL) 300 MG tablet Take 1 tablet (300 mg total) by mouth at bedtime. X 1 week, then 600 mg nightly x1 week;  can increase to 900 mg nightly- -for nerve pain (Patient taking differently: Take 900 mg by mouth at bedtime.) 90 tablet 5 11/14/2021  ? oxyCODONE (OXY IR/ROXICODONE) 5 MG immediate release tablet Take 5

## 2021-11-21 ENCOUNTER — Inpatient Hospital Stay (HOSPITAL_COMMUNITY): Payer: Medicare HMO

## 2021-11-21 DIAGNOSIS — D496 Neoplasm of unspecified behavior of brain: Secondary | ICD-10-CM | POA: Diagnosis not present

## 2021-11-21 DIAGNOSIS — M79605 Pain in left leg: Secondary | ICD-10-CM | POA: Diagnosis not present

## 2021-11-21 DIAGNOSIS — M79604 Pain in right leg: Secondary | ICD-10-CM

## 2021-11-21 LAB — HAPTOGLOBIN: Haptoglobin: 389 mg/dL — ABNORMAL HIGH (ref 33–346)

## 2021-11-21 LAB — GLUCOSE, CAPILLARY
Glucose-Capillary: 99 mg/dL (ref 70–99)
Glucose-Capillary: 142 mg/dL — ABNORMAL HIGH (ref 70–99)
Glucose-Capillary: 116 mg/dL — ABNORMAL HIGH (ref 70–99)
Glucose-Capillary: 153 mg/dL — ABNORMAL HIGH (ref 70–99)

## 2021-11-21 MED ORDER — SORBITOL 70 % SOLN
30.0000 mL | Freq: Once | Status: AC
  Administered 2021-11-21: 30 mL via ORAL
  Filled 2021-11-21: qty 30

## 2021-11-21 MED ORDER — POLYETHYLENE GLYCOL 3350 17 G PO PACK
17.0000 g | PACK | Freq: Every day | ORAL | Status: DC
  Administered 2021-11-22 – 2021-11-24 (×3): 17 g via ORAL
  Filled 2021-11-21 (×3): qty 1

## 2021-11-21 MED ORDER — SUMATRIPTAN SUCCINATE 100 MG PO TABS
100.0000 mg | ORAL_TABLET | ORAL | Status: DC | PRN
  Administered 2021-11-21 – 2021-11-24 (×4): 100 mg via ORAL
  Filled 2021-11-21 (×6): qty 1

## 2021-11-21 NOTE — Patient Care Conference (Signed)
Inpatient RehabilitationTeam Conference and Plan of Care Update ?Date: 11/21/2021   Time: 11:05 AM  ? ? ?Patient Name: Virginia Luna      ?Medical Record Number: [id number]  ?Date of Birth: 1961/08/19 ?Sex: Female         ?Room/Bed: 4M05C/4M05C-01 ?Payor Info: Payor: [employer] MEDICARE / Plan: [place] HMO / Product Type: *No Product type* /   ? ?Admit Date/Time:  11/15/2021 10:59 AM ? ?Primary Diagnosis:  Cerebellar tumor ([place]) ? ?Hospital Problems: Principal Problem: ?  Cerebellar tumor ([place]) ?Active Problems: ?  Cerebellar mass ? ? ? ?Expected Discharge Date: Expected Discharge Date: 11/24/21 ? ?Team Members Present: ?Physician leading conference: Dr. Courtney Heys ?Social Worker Present: Loralee Pacas, LCSWA ?Nurse Present: Dorthula Nettles, RN ?PT Present: Excell Seltzer, PT ?OT Present: Roanna Epley, Claris Gladden, OT ?PPS Coordinator present : Gunnar Fusi, SLP ? ?   Current Status/Progress Goal Weekly Team Focus  ?Bowel/Bladder ? ? cont of B/B.Last BM 4/1  remain cont.  assess q shift and PRN   ?Swallow/Nutrition/ Hydration ? ?           ?ADL's ? ? functional tranfsers-CGA; bathing/dressing-CGA/supervision; toileting-supervision  supervision overall; mod I dressing  activity tolerance, transfers, safety awareness, education   ?Mobility ? ? CGA overall with RW, gait up to 100 ft RW CGA, CGA to min A stairs with 2 handrails  Supervision to mod I overall  endurance, balance, gait mechanics (step length, BOS, etc.)   ?Communication ? ? Min A  Supervision at sentence level  pacing and overarticulation   ?Safety/Cognition/ Behavioral Observations ? Min A  Supervision A for basic problem-solving, recall of novel information, selective attention, and emergent awareness; goals upgraded  functional therapy tasks targeting recall, problem-solving and awareness   ?Pain ? ? pain 7 of 10 - head  pain<3  assess pain q shift and PRN   ?Skin ? ? sx site to L side head  proper healing, no signs of infection  assess  skin q shift and PRN   ? ? ?Discharge Planning:  ?D/c to home with 24/7 care from pt s/o Herbert and PRN assistance from goddaughter, godsister, and her sister.   ?Team Discussion: ?Cognitively needs improvement, functionally doing well. Neuro to assess tremor. Therapy doesn't see tremors. Reports Migraine headache, Imitrex ordered. Imitrex to be given within 15 min of onset to be effective. CBG's good, weaning steroids. Continent B/B, reports nausea and dizziness. Incision OTA. Two separate family education days scheduled.  ?  ?Patient on target to meet rehab goals: ?yes, supervision to mod I goals. Currently CGA with RW 143f. CGA/min assist stairs. CGA/supervision transfers, bathing, and toileting. Fatigues easily. Min assist SLP, will not work on medication management. ? ?*See Care Plan and progress notes for long and short-term goals.  ? ?Revisions to Treatment Plan:  ?Adjusting medications. ?  ?Teaching Needs: ?Family education, medication/pain management, skin/wound care, safety awareness, transfer/gait training, etc. ?  ?Current Barriers to Discharge: ?Decreased caregiver support, Home enviroment access/layout, Wound care, Lack of/limited family support, and Behavior ? ?Possible Resolutions to Barriers: ?Family education ?Order recommended DME ?Follow-up Outpatient therapy ?  ? ? Medical Summary ?Current Status: intermittent migraines- nausea and dizziness- incision open to air- 2L  a tnight- refuses CPAP- continent B/B ? Barriers to Discharge: Other (comments);Decreased family/caregiver support;Behavior;Home enviroment access/layout;Medical stability;Weight;Weight bearing restrictions;Wound care ? Barriers to Discharge Comments: cognitively impaired-just a little off- poor historian-  brother to be primary caregiver after d/c.will benefit from outpt. needs RW? ?Possible  Resolutions to [employer] Focus: CGA overall with PT- limited to 142f- fatigue; is CGA-supervision-tremor bothering her, but not  limiting therapy- SLP- currently min A- refusing med mgmt- uses calculator/compsensatory techniques- vision changes since surger-y likely steroids- will improve- Friday 4/7 ? ? ?Continued Need for Acute Rehabilitation Level of Care: The patient requires daily medical management by a physician with specialized training in physical medicine and rehabilitation for the following reasons: ?Direction of a multidisciplinary physical rehabilitation program to maximize functional independence : Yes ?Medical management of patient stability for increased activity during participation in an intensive rehabilitation regime.: Yes ?Analysis of laboratory values and/or radiology reports with any subsequent need for medication adjustment and/or medical intervention. : Yes ? ? ?I attest that I was present, lead the team conference, and concur with the assessment and plan of the team. ? ? ?JDorthula NettlesG ?11/21/2021, 4:39 PM  ? ? ? ? ? ? ?

## 2021-11-21 NOTE — Progress Notes (Signed)
Occupational Therapy Session Note ? ?Patient Details  ?Name: Virginia Luna ?MRN: [id number] ?Date of Birth: 10-01-1960 ? ?Today's Date: 11/21/2021 ?OT Individual Time: 0815-0900 ?OT Individual Time Calculation (min): 45 min  ? ? ?Short Term Goals: ?Week 1:  OT Short Term Goal 1 (Week 1): STG = LTG due to ELOS ? ?Skilled Therapeutic Interventions/Progress Updates:  ?  Pt resting in bed upon arrival. OT intervention with focus on bed mobility, sit<>stand, standing balance, BADLs EOB, functional transfers, and toileting to increase independence with BADLs. Supine>sit EOB with supervision. Pt amb with RW into bathroom with supervisoin and completed toileting tasks with supervision. Pt completed bathing/dressing tasks with sit<>stand from EOB with CGA when standing. Pt transferred to recliner at end of session. Pt remained in recliner with all needs within reach. Seat alarm activated.  ? ?Therapy Documentation ?Precautions:  ?Precautions ?Precautions: Fall ?Restrictions ?Weight Bearing Restrictions: No ?Pain: ? Pt reports left side of head/neck feels like a "tomahawk" beating on it; pt reported that she wanted to "work through it" and get meds at end of therapy ? ? ?Therapy/Group: Individual Therapy ? ?Leroy Libman ?11/21/2021, 9:05 AM ?

## 2021-11-21 NOTE — Progress Notes (Addendum)
?                                                       PROGRESS NOTE ? ? ?Subjective/Complaints: ? ?Doesn't feel constipated, but has been 3 days- usually goes daily-  ?Has HA this AM- rates as '101/0". Hasn't asked for meds for HA this AM.  ? ?Refused CPAP due to head incision last night-  ?Still concerned about her tremors that are limiting therapy per pt ? ?ROS: ? ?Pt denies SOB, abd pain, CP, N/V/C/D, and vision changes ? ? ?Objective: ?  ?No results found. ?Recent Labs  ?  11/20/21 ?0622  ?WBC 13.9*  ?HGB 9.2*  ?HCT 29.0*  ?PLT 413*  ? ?Recent Labs  ?  11/20/21 ?0622  ?NA 142  ?K 3.0*  ?CL 104  ?CO2 28  ?GLUCOSE 147*  ?BUN 16  ?CREATININE 0.72  ?CALCIUM 8.6*  ? ? ?Intake/Output Summary (Last 24 hours) at 11/21/2021 0852 ?Last data filed at 11/21/2021 7353 ?Gross per 24 hour  ?Intake 840 ml  ?Output --  ?Net 840 ml  ?  ? ?  ? ?Physical Exam: ?Vital Signs ?Blood pressure 117/68, pulse 82, temperature 98.2 ?F (36.8 ?C), temperature source Oral, resp. rate 16, height '5\' 1"'$  (1.549 m), weight 119.4 kg, SpO2 96 %. ? ? ? ? ?General: awake, alert, appropriate, sitting up in bed; NAD ?HENT: conjugate gaze; oropharynx moist- posterior/occipital crani= looks good ?CV: regular rate; no JVD ?Pulmonary: CTA B/L; no W/R/R- good air movement ?GI: soft, NT, ND, (+)BS- hypoactive ?Psychiatric: appropriate- childlike tone ?Neurological: technically Ox3, but poor memory; tremors seen at rest and with intention of B/L Ue's- L>R ?Skin: incision c/d/i ?Neuro/ Musculoskeletal: Alert and oriented x4. 5/5 strength. Sensation intact. Cervical myofascial tenderness. Tenderness bilateral knees.  ? ?Assessment/Plan: ?1. Functional deficits which require 3+ hours per day of interdisciplinary therapy in a comprehensive inpatient rehab setting. ?Physiatrist is providing close team supervision and 24 hour management of active medical problems listed below. ?Physiatrist and rehab team continue to assess barriers to discharge/monitor patient  progress toward functional and medical goals ? ?Care Tool: ? ?Bathing ?   ?Body parts bathed by patient: Right arm, Left arm, Chest, Abdomen, Front perineal area, Buttocks, Right upper leg, Left upper leg, Face  ? Body parts bathed by helper: Right lower leg, Left lower leg ?  ?  ?Bathing assist Assist Level: Minimal Assistance - Patient > 75% ?  ?  ?Upper Body Dressing/Undressing ?Upper body dressing   ?What is the patient wearing?: Pull over shirt ?   ?Upper body assist Assist Level: Supervision/Verbal cueing ?   ?Lower Body Dressing/Undressing ?Lower body dressing ? ? ?   ?What is the patient wearing?: Underwear/pull up, Pants ? ?  ? ?Lower body assist Assist for lower body dressing: Supervision/Verbal cueing ?   ? ?Toileting ?Toileting    ?Toileting assist Assist for toileting: Supervision/Verbal cueing ?  ?  ?Transfers ?Chair/bed transfer ? ?Transfers assist ? Chair/bed transfer activity did not occur: N/A ? ?Chair/bed transfer assist level: Contact Guard/Touching assist ?Chair/bed transfer assistive device: Walker ?  ?Locomotion ?Ambulation ? ? ?Ambulation assist ? ?   ? ?Assist level: Contact Guard/Touching assist ?Assistive device: Walker-rolling ?Max distance: 176f  ? ?Walk 10 feet activity ? ? ?Assist ?   ? ?Assist level: Contact Guard/Touching  assist ?Assistive device: Walker-rolling  ? ?Walk 50 feet activity ? ? ?Assist   ? ?Assist level: Minimal Assistance - Patient > 75% ?   ? ? ?Walk 150 feet activity ? ? ?Assist Walk 150 feet activity did not occur: N/A ? ?  ?  ?  ? ?Walk 10 feet on uneven surface  ?activity ? ? ?Assist Walk 10 feet on uneven surfaces activity did not occur: Safety/medical concerns ? ? ?Assist level: Contact Guard/Touching assist ?Assistive device: Walker-rolling  ? ?Wheelchair ? ? ? ? ?Assist Is the patient using a wheelchair?: Yes ?Type of Wheelchair: Manual ?Wheelchair activity did not occur: Safety/medical concerns ? ?Wheelchair assist level: Dependent - Patient 0% ?    ? ? ?Wheelchair 50 feet with 2 turns activity ? ? ? ?Assist ? ?  ?  ? ? ?Assist Level: Dependent - Patient 0%  ? ?Wheelchair 150 feet activity  ? ? ? ?Assist ?   ? ? ?Assist Level: Dependent - Patient 0%  ? ?Blood pressure 117/68, pulse 82, temperature 98.2 ?F (36.8 ?C), temperature source Oral, resp. rate 16, height '5\' 1"'$  (1.549 m), weight 119.4 kg, SpO2 96 %. ? ?Medical Problem List and Plan: ?1. 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            -patient may shower but incision must be covered ?            -ELOS/Goals: 10-14 days S ?            Continue CIR- PT, OT and SLP ? Team conference today to determine length of stay ?2.  Antithrombotics: ?-DVT/anticoagulation:  Mechanical: Sequential compression devices, below knee Bilateral lower extremities ?            -antiplatelet therapy: none ?3. Postoperative pain and hx of Fibromyalgia: continue Tylenol, Norco, Robaxin 750 mg PRN ? 4/4- pain stable- has HA though- asked her to ask for pain meds ?4. Mood: LCSW to evaluate and provide emotional support ?            -antipsychotic agents: Seroquel ?5. Neuropsych: This patient is capable of making decisions on her own behalf. ?6. Skin/Wound Care: Routine skin care checks ?7. Fluids/Electrolytes/Nutrition: Routine I's and O's and follow-up chemistries ?8: Cerebellar tumor s/p craniectomy: continue Trileptal 900 mg q HS ?            --meclizine prn  ? 3/31- had some shaking earlier today- could have been tremors?- monitor closely.  ? 4/4- asked Neuro to assess tremors- since saw at rest and with intention- asking if there's anything else to help with? ?9: Hypertension: increase amlodipine to '10mg'$  daily ? 4/3- BP controlled-con't increased meds ?10: Hyperlipidemia: continue atorvastatin 10 mg daily ?11: Seasonal allergies: continue Xyzal, Astelin, Flonase prn ?12: Asthma: continue Singulair, Breo Ellipta, Proventil ?13: Fibromyalgia: pain  management as above ?14: Migraine headache: Maxalt prn ? 4/4- HA this AM- [facility] so [facility] Imit[facility] for pt prn ?15: Insomnia: continue melatonin ?16: Sleep apnea: no records of CPAP/BiPap ?27: DM-2: continue CBG monitoring and carb mod diet ?            --continue semaglutide [place] every Sunday ? CBG (last 3)  ?Recent Labs  ?  11/20/21 ?1648 11/20/21 ?2113 11/21/21 ?0621  ?GLUCAP 112* 134* 99  ?  4/3- BG's controlled- con't regimen ?18: Neurogenic bladder: no records ?19: vitamin D deficiency: no  supplements. Check levels ? 20. Leukocytosis ? 3/30- WBC 18.6- likely due to Dexamethasone- will monitor- afebrile and no other Sx's of infection ? 3/31- will recheck Monday and monitor for S'S's of infection ? 4/3- WBC down to 13.9- afebrile- doing better with reduction in decadron ?21. Steroid dosing ? 3/31- will check into if NSU wanted a steroid wean at all . ? 4/3- weaning per NSU ? 4/4- down to 2 mg daily x 2 days-  ?22. Constipation ? 4/4- will add Miralax starting tomorrow and give sorbitol after therapy- has been 3 days and usually goes daily.  ? ?I spent a total of 52   minutes on total care today- >50% coordination of care- due to d/w neuro about tremors that are limiting therapy- also team conference to determine length of stay ? ? ?LOS: ?6 days ?A FACE TO FACE EVALUATION WAS PERFORMED ? ?Virginia Luna ?11/21/2021, 8:52 AM  ? ? ? ?

## 2021-11-21 NOTE — Plan of Care (Signed)
?  Problem: RH BOWEL ELIMINATION ?Goal: RH STG MANAGE BOWEL WITH ASSISTANCE ?Description: STG Manage Bowel with Supervision Assistance. ?Outcome: Not Progressing; LBM 4/1; new orders for laxatives noted ?  ?Problem: RH PAIN MANAGEMENT ?Goal: RH STG PAIN MANAGED AT OR BELOW PT'S PAIN GOAL ?Description: < 3 on a 0-10 pain scale. ?Outcome: Not Progressing; c/o h/a 10/10per pt; new orders noted ?  ?

## 2021-11-21 NOTE — Progress Notes (Signed)
Pt assisted with home CPAP machine. Water added to chamber  ?

## 2021-11-21 NOTE — Progress Notes (Signed)
Lower extremity venous has been completed.  ? ?Preliminary results in CV Proc.  ? ?Virginia Luna Virginia Luna ?11/21/2021 10:14 AM    ?

## 2021-11-21 NOTE — Progress Notes (Signed)
Occupational Therapy Session Note ? ?Patient Details  ?Name: BRAXTON WEISBECKER ?MRN: [id number] ?Date of Birth: 08-18-1961 ? ?Today's Date: 11/21/2021 ?OT Individual Time: 3557-3220 ?OT Individual Time Calculation (min): 40 min  ? ? ?Short Term Goals: ?Week 1:  OT Short Term Goal 1 (Week 1): STG = LTG due to ELOS ? ?Skilled Therapeutic Interventions/Progress Updates:  ?  Pt resting in recliner upon arrival. Pt's family joined session for education. Pt's god sister present. Educated family on assist level (supervision) and recommendations. Pt practiced TTB transfers with supervision. Pt requested use of toilet. Amb with RW and toileting tasks with supervision. Family and pt verbalized understanding of recommendations. Pt remained in recliner with family present. All needs within reach.  ? ?Therapy Documentation ?Precautions:  ?Precautions ?Precautions: Fall ?Restrictions ?Weight Bearing Restrictions: No ?General: ?  ?Vital Signs: ?  ?Pain: ?Pain Assessment ?Pain Score: 8  ?ADL: ?ADL ?Eating: Set up ?Where Assessed-Eating: Bed level ?Grooming: Setup ?Where Assessed-Grooming: Edge of bed ?Upper Body Bathing: Supervision/safety ?Where Assessed-Upper Body Bathing: Edge of bed ?Lower Body Bathing: Minimal assistance ?Where Assessed-Lower Body Bathing: Edge of bed ?Upper Body Dressing: Minimal assistance ?Where Assessed-Upper Body Dressing: Edge of bed ?Lower Body Dressing: Moderate assistance ?Where Assessed-Lower Body Dressing: Edge of bed ?Toileting: Contact guard ?Where Assessed-Toileting: Toilet ?Toilet Transfer: Contact guard ?Toilet Transfer Method: Ambulating ?Science writer: Grab bars ?Tub/Shower Transfer: Unable to assess ?Tub/Shower Transfer Method: Unable to assess ?Walk-In Shower Transfer: Unable to assess ?Walk-In Shower Transfer Method: Unable to assess ?Vision ?  ?Perception  ?  ?Praxis ?  ?Balance ?  ?Exercises: ?  ?Other Treatments:   ? ? ?Therapy/Group: Individual Therapy ? ?Leroy Libman ?11/21/2021, 1:48 PM ?

## 2021-11-21 NOTE — Plan of Care (Signed)
Goals upgraded due to pt progress. ? ?Problem: RH Problem Solving ?Goal: LTG Patient will demonstrate problem solving for (SLP) ?Description: LTG:  Patient will demonstrate problem solving for basic/complex daily situations with cues  (SLP) ?Flowsheets (Taken 11/21/2021 0702) ?LTG: Patient will demonstrate problem solving for (SLP): Basic daily situations ?LTG Patient will demonstrate problem solving for: Supervision ?Note: Goal upgraded due to pt progress ?  ?Problem: RH Memory ?Goal: LTG Patient will demonstrate ability for day to day (SLP) ?Description: LTG:   Patient will demonstrate ability for day to day recall/carryover during cognitive/linguistic activities with assist  (SLP) ?Flowsheets ?Taken 11/21/2021 0702 ?LTG: Patient will demonstrate ability for day to day recall/carryover during cognitive/linguistic activities with assist (SLP): Supervision ?Taken 11/16/2021 2106 ?LTG: Patient will demonstrate ability for day to day recall: New information ?Note: Goal upgraded based on pt progress ?  ?Problem: RH Attention ?Goal: LTG Patient will demonstrate this level of attention during functional activites (SLP) ?Description: LTG:  Patient will will demonstrate this level of attention during functional activites (SLP) ?Flowsheets ?Taken 11/21/2021 0702 ?Patient will demonstrate during cognitive/linguistic activities the attention type of: ? Selective ? Alternating ?Patient will demonstrate this level of attention during cognitive/linguistic activities in: Controlled ?LTG: Patient will demonstrate this level of attention during cognitive/linguistic activities with assistance of (SLP): Supervision ?Taken 11/16/2021 2106 ?Number of minutes patient will demonstrate attention during cognitive/linguistic activities: 30 ?Note: Goal upgraded due to pt progress ?  ?

## 2021-11-21 NOTE — Progress Notes (Signed)
Patient ID: Virginia Luna, female   DOB: Oct 09, 1960, 61 y.o.   MRN: [id number] ? ?SW briefly met with pt, pt sister, and pt goddtr prior to OT session informing on d/c date 4/7. SW informed will f/u later today with updates.  ? ?1408-SW left message for pt boyfriend Kennith [place] to provide d/c date, confirm family edu tomorrow 20am-12,  and d/c recs- RW and outpatient PT/OT/SLP. SW encouraged follow-up if needed.  ?*confirms family edu tomorrow, and will bring in [place] he found yesterday to determine if this is the right one she needs.  ? ?Loralee Pacas, MSW, LCSWA ?Office: [phone] ?Cell: [phone] ?Fax: [phone]  ?

## 2021-11-21 NOTE — Progress Notes (Signed)
Speech Language Pathology Daily Session Note ? ?Patient Details  ?Name: Virginia Luna ?MRN: [id number] ?Date of Birth: 10/23/1960 ? ?Today's Date: 11/21/2021 ?SLP Individual Time: 3646-8032 ?SLP Individual Time Calculation (min): 42 min ? ?Short Term Goals: ?Week 1: SLP Short Term Goal 1 (Week 1): Pt will utilize 2 out of 4 memory strategies to recall functional information re: iADL's given Min verbal A. ?SLP Short Term Goal 2 (Week 1): Pt will complete basic + functional problem-solving tasks re: pill organizer, money, and scheduling to prepare for discharge given Min A for use of attention and executive functioning strategies. ?SLP Short Term Goal 3 (Week 1): Pt will communicate wants/needs, ideas, and thoughts with 90% intelligibility given Min A for use of pacing and over-articulation strategies. ? ?Skilled Therapeutic Interventions: Skilled ST treatment focused on family education. SLP educated family on cognitive-communication, speech, and swallowing deficits and strategies to maximize attention, memory, and safety. Discussed recommendations for supervision with iADLs in regards to medication and finance management, as well as follow up SLP in outpatient setting to further address cognitive skills. Provided pt/family with medication management chart for organization, as well as handout containing further information on cognitive-communication disorders. Family and pt verbalized understanding of recommendations. Patient was left in recliner with alarm activated and immediate needs within reach at end of session. Family had to leave for a last minute appointment. Continue per current plan of care.   ?   ?Pain ? None ? ?Therapy/Group: Individual Therapy ? ?Kendrell Lottman T Lucelia Lacey ?11/21/2021, 2:42 PM ?

## 2021-11-21 NOTE — Progress Notes (Signed)
Physical Therapy Session Note ? ?Patient Details  ?Name: Virginia Luna ?MRN: [id number] ?Date of Birth: 01/17/1961 ? ?Today's Date: 11/21/2021 ?PT Individual Time: 9371-6967 ?PT Individual Time Calculation (min): 55 min  ? ?Short Term Goals: ?Week 1:  PT Short Term Goal 1 (Week 1): = to LTGs based on ELOS ? ? ?Skilled Therapeutic Interventions/Progress Updates:  ?Pt received sitting in recliner and agreeable to therapy. Pt reports that her sister would not be present for family edu this date but pt's boyfriend will still be coming tomorrow to receive edu. CGA-supervision for all transfers with use of RW for safety. Pt initiated gait training with RW and required CGA-supervision, with min verbal cuing to maintain upright posture. Pt ambulated 136f with 2-3 brief standing breaks to offload her LUE d/t reports of pain; but no intervention required - requires increased time to complete gait training and ensure adequate foot clearance, decreased stride length bilat and tendency towards forward flexed posture.  ? ?Performed: ?Balance activity involving ball toss to Rebounder CGA without UE support from walker in normal stance and tandem stance. Progressed to ball toss with therapist while SPT provided CGA to encourage use of the LUE to throw (weaker side). Performed alternating step-taps onto a cone with CGA for safety d/t pt having decreased sensation and proprioception in both feet; had difficulty clearing height of the cone in order to tap, and tendency to "slide" LLE along cone or step down on the cone d/t LOB. Pt transported in w/c back to room for time management. Transferred to recliner with RW CGA, pt left in recliner with safety belt active and all needs in reach. ? ?Therapy Documentation ?Precautions:  ?Precautions ?Precautions: Fall ?Restrictions ?Weight Bearing Restrictions: No ? ? ?Therapy/Group: Individual Therapy ? ?RLanetta InchSPT ? ?11/21/2021, 5:35 PM  ?

## 2021-11-22 DIAGNOSIS — D496 Neoplasm of unspecified behavior of brain: Secondary | ICD-10-CM | POA: Diagnosis not present

## 2021-11-22 LAB — BASIC METABOLIC PANEL
Anion gap: 9 (ref 5–15)
BUN: 19 mg/dL (ref 6–20)
CO2: 27 mmol/L (ref 22–32)
Calcium: 8.9 mg/dL (ref 8.9–10.3)
Chloride: 104 mmol/L (ref 98–111)
Creatinine, Ser: 0.63 mg/dL (ref 0.44–1.00)
GFR, Estimated: >60 mL/min (ref >60)
Glucose, Bld: 107 mg/dL — ABNORMAL HIGH (ref 70–99)
Potassium: 4 mmol/L (ref 3.5–5.1)
Sodium: 140 mmol/L (ref 135–145)

## 2021-11-22 LAB — URINALYSIS, ROUTINE W REFLEX MICROSCOPIC
Bilirubin Urine: NEGATIVE
Glucose, UA: NEGATIVE mg/dL
Hgb urine dipstick: NEGATIVE
Ketones, ur: NEGATIVE mg/dL
Nitrite: POSITIVE — AB
Protein, ur: NEGATIVE mg/dL
Specific Gravity, Urine: 1.019 (ref 1.005–1.030)
WBC, UA: >50 WBC/hpf — ABNORMAL HIGH (ref 0–5)
pH: 5 (ref 5.0–8.0)

## 2021-11-22 LAB — CBC
HCT: 31.7 % — ABNORMAL LOW (ref 36.0–46.0)
Hemoglobin: 10 g/dL — ABNORMAL LOW (ref 12.0–15.0)
MCH: 27 pg (ref 26.0–34.0)
MCHC: 31.5 g/dL (ref 30.0–36.0)
MCV: 85.7 fL (ref 80.0–100.0)
Platelets: 388 10*3/uL (ref 150–400)
RBC: 3.7 MIL/uL — ABNORMAL LOW (ref 3.87–5.11)
RDW: 17.2 % — ABNORMAL HIGH (ref 11.5–15.5)
WBC: 15.1 10*3/uL — ABNORMAL HIGH (ref 4.0–10.5)
nRBC: 0 % (ref 0.0–0.2)

## 2021-11-22 LAB — GLUCOSE, CAPILLARY
Glucose-Capillary: 99 mg/dL (ref 70–99)
Glucose-Capillary: 142 mg/dL — ABNORMAL HIGH (ref 70–99)
Glucose-Capillary: 119 mg/dL — ABNORMAL HIGH (ref 70–99)
Glucose-Capillary: 121 mg/dL — ABNORMAL HIGH (ref 70–99)

## 2021-11-22 MED ORDER — FOSFOMYCIN TROMETHAMINE 3 G PO PACK
3.0000 g | PACK | Freq: Once | ORAL | Status: AC
  Administered 2021-11-22: 3 g via ORAL
  Filled 2021-11-22: qty 3

## 2021-11-22 NOTE — Progress Notes (Signed)
Physical Therapy Discharge Summary ? ?Patient Details  ?Name: Virginia Luna ?MRN: [id number] ?Date of Birth: [birth year] ? ?Today's Date: 11/23/2021 ? ? ? ?Patient has met 9 of 9 long term goals due to improved activity tolerance, improved balance, improved postural control, increased strength, decreased pain, and ability to compensate for deficits.  Patient to discharge at an ambulatory level Supervision.   Patient's care partner is independent to provide the necessary physical and cognitive assistance at discharge. Pt's significant other Virginia Luna has completed hands-on family education and is safe to assist pt upon d/c home. Pt's god-sister was not present for scheduled family education session. ? ?Reasons goals not met: N/A all goals met ? ?Recommendation:  ?Patient will benefit from ongoing skilled PT services in outpatient setting to continue to advance safe functional mobility, address ongoing impairments in endurance, strength, balance, safety, independence with functional mobility, and minimize fall risk. ? ?Equipment: ?RW ? ?Reasons for discharge: treatment goals met and discharge from hospital ? ?Patient/family agrees with progress made and goals achieved: Yes ? ?PT Discharge ?Precautions/Restrictions ?Precautions ?Precautions: Fall ?Restrictions ?Weight Bearing Restrictions: No ?Vital Signs ?Therapy Vitals ?BP: (!) 121/45 ?Pain ?  ?Pain Interference ?Pain Interference ?Pain Effect on Sleep: 4. Almost constantly ?Pain Interference with Therapy Activities: 1. Rarely or not at all ?Pain Interference with Day-to-Day Activities: 2. Occasionally ?Vision/Perception  ?Vision - History ?Ability to See in Adequate Light: 1 Impaired ?Vision - Assessment ?Eye Alignment: Within Functional Limits ?Ocular Range of Motion: Within Functional Limits ?Tracking/Visual Pursuits: Decreased smoothness of vertical tracking;Decreased smoothness of horizontal tracking ?Saccades: Decreased speed of saccadic  movement;Overshoots ?Convergence: Impaired (comment) ?Perception ?Perception: Within Functional Limits ?Praxis ?Praxis: Intact  ?Cognition ?Overall Cognitive Status: Within Functional Limits for tasks assessed ?Arousal/Alertness: Awake/alert ?Orientation Level: Oriented X4 ?Year: 2023 ?Month: April ?Day of Week: Correct ?Attention: Sustained ?Focused Attention: Appears intact ?Sustained Attention: Appears intact ?Sustained Attention Impairment: Functional basic ?Selective Attention: Impaired ?Selective Attention Impairment: Functional basic ?Memory: Impaired ?Memory Impairment: Retrieval deficit;Decreased recall of new information ?Awareness: Impaired ?Awareness Impairment: Emergent impairment ?Safety/Judgment: Appears intact ?Sensation ?Sensation ?Light Touch: Appears Intact ?Peripheral sensation comments: decreased sensation R>L ?Hot/Cold: Appears Intact ?Proprioception: Appears Intact ?Stereognosis: Not tested ?Coordination ?Fine Motor Movements are Fluid and Coordinated: No ?Finger Nose Finger Test: delayed; dysmetria bilateral ?Motor  ?Motor ?Motor: Other (comment);Ataxia ?Motor - Skilled Clinical Observations: generalized weakness/deconditioning and ataxia  ?Mobility ?Bed Mobility ?Bed Mobility: Sit to Supine;Supine to Sit ?Supine to Sit: Independent with assistive device ?Sit to Supine: Independent with assistive device ?Transfers ?Transfers: Sit to Stand;Stand to [employer] Transfers ?Sit to Stand: Independent with assistive device ?Stand to Sit: Independent with assistive device ?Stand Pivot Transfers: Independent with assistive device ?Transfer (Assistive device): Rolling walker ?Locomotion  ?Gait ?Ambulation: Yes ?Gait Assistance: Supervision/Verbal cueing ?Gait Distance (Feet): 200 Feet ?Assistive device: Rolling walker ?Gait Assistance Details: decreased self selected speed, with mild increased L lean ?Gait ?Gait: Yes ?Gait Pattern: Impaired ?Gait Pattern: Step-through pattern;Decreased step length  - left;Decreased step length - right;Decreased hip/knee flexion - right;Decreased hip/knee flexion - left ?Gait velocity: decreased ?Stairs / Additional Locomotion ?Stairs: Yes ?Stairs Assistance: Contact Guard/Touching assist ?Stair Management Technique: Two rails;No rails ?Number of Stairs: 4 ?Height of Stairs: 6 ?Curb: Supervision/Verbal cueing ?Pick up small object from the floor assist level: Contact Guard/Touching assist ?Pick up small object from the floor assistive device: standing with RW ?Wheelchair Mobility ?Wheelchair Mobility: No  ?Trunk/Postural Assessment  ?Cervical Assessment ?Cervical Assessment: Exceptions to [facility] ?Thoracic Assessment ?Thoracic Assessment: Within Functional Limits ?  Lumbar Assessment ?Lumbar Assessment: Within Functional Limits ?Postural Control ?Postural Control: Deficits on evaluation ?Trunk Control: WFL in sitting ?Protective Responses: delayed  ?Balance ?Static Sitting Balance ?Static Sitting - Balance Support: Feet supported;No upper extremity supported ?Static Sitting - Level of Assistance: 6: Modified independent (Device/Increase time) ?Dynamic Sitting Balance ?Dynamic Sitting - Balance Support: During functional activity ?Dynamic Sitting - Level of Assistance: 6: Modified independent (Device/Increase time) ?Extremity Assessment  ?RUE Assessment ?RUE Assessment: Exceptions to [facility] ?Active Range of Motion (AROM) Comments: 90 degrees shoulder flexion ?General Strength Comments: 3+/5 grossly ?LUE Assessment ?LUE Assessment: Exceptions to [facility] ?Active Range of Motion (AROM) Comments: 90 degrees shoulder flexion ?General Strength Comments: grossly 3+/5 ?RLE Assessment ?RLE Assessment: Exceptions to [facility] ?Active Range of Motion (AROM) Comments: WFL ?General Strength Comments: Grossly 4/5 proximal to distal ?LLE Assessment ?LLE Assessment: Exceptions to [facility] ?Active Range of Motion (AROM) Comments: WFL ?General Strength Comments: Grossly 4/5 proximal to distal ? ? ? ?Rosita  De[place] ?Excell Seltzer, PT, DPT, CSRS ? ?11/23/2021, 12:47 PM ?

## 2021-11-22 NOTE — Progress Notes (Signed)
Physical Therapy Session Note ? ?Patient Details  ?Name: Virginia Luna ?MRN: [id number] ?Date of Birth: 10/29/1960 ? ?Today's Date: 11/22/2021 ?PT Individual Time: 1305-1335 ?PT Individual Time Calculation (min): 30 min  ? ?Short Term Goals: ?Week 1:  PT Short Term Goal 1 (Week 1): = to LTGs based on ELOS ? ?Skilled Therapeutic Interventions/Progress Updates: Pt presented in recliner agreeable to therapy. Pt denies pain, does indicate some stiffness at neck towards end of session. Pt requesting to use bathroom prior to leaving room. Pt performed ambulatory transfer to bathroom with RW and supervision, supervision for toilet transfers (+void). Pt then ambulated to sink once completed to perform hand hygiene. Pt then transferred to w/c and transported to [facility] entrance for time management. In [facility] courtyard pt participated in ambulation in community setting including up/down grades, and uneven surfaces. Pt was able to ambulate at slow controlled rate however no increased lean nor LOB. At [facility] entrance pt was able to safely manage RW over doormats and thresholds with good safety. Once back in atrium pt returned to w/c and transported back to room. Pt returned to recliner in same manner as prior at end of session and left with call bell within reach and needs met.  ?   ? ?Therapy Documentation ?Precautions:  ?Precautions ?Precautions: Fall ?Restrictions ?Weight Bearing Restrictions: No ?General: ?  ?Vital Signs: ?Therapy Vitals ?Temp: 98.6 ?F (37 ?C) ?Temp Source: Oral ?Pulse Rate: 89 ?Resp: 16 ?BP: 125/73 ?Patient Position (if appropriate): Sitting ?Oxygen Therapy ?SpO2: 95 % ?O2 Device: Room Air ?Pain: ?Pain Assessment ?Pain Score: 0-No pain ?Mobility: ?  ?Locomotion : ?   ?Trunk/Postural Assessment : ?   ?Balance: ?  ?Exercises: ?  ?Other Treatments:   ? ? ? ?Therapy/Group: Individual Therapy ? ?  ?11/22/2021, 3:42 PM  ?

## 2021-11-22 NOTE — Progress Notes (Signed)
?                                                       PROGRESS NOTE ? ? ?Subjective/Complaints: ? 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  ? ?Also on O2 this Am- just because did last night- took CPAP off after a few hours.  ?Did try for a few hours though.  ? ?Denies constipation- had "small BM"- per nursing documentation, was large.  ? ? ? ?ROS: ? ?Pt denies SOB, abd pain, CP, N/V/C/D, and vision changes ? ? ?Objective: ?  ?VAS [name] LOWER EXTREMITY VENOUS (DVT) ? ?Result Date: 11/21/2021 ? Lower Venous DVT Study Patient Name:  Virginia Luna  Date of Exam:   11/21/2021 Medical Rec #: [id number]          Accession #:    [id number] Date of Birth: 03-23-1961           Patient Gender: F Patient Age:   61 years Exam Location:  [facility] Procedure:      VAS [name] LOWER EXTREMITY VENOUS (DVT) Referring Phys: Marlet Korte --------------------------------------------------------------------------------  Indications: Pain.  Comparison Study: no prior Performing Technologist: Archie Patten RVS  Examination Guidelines: A complete evaluation includes B-mode imaging, spectral Doppler, color Doppler, and power Doppler as needed of all accessible portions of each vessel. Bilateral testing is considered an integral part of a complete examination. Limited examinations for reoccurring indications may be performed as noted. The reflux portion of the exam is performed with the patient in reverse Trendelenburg.  +---------+---------------+---------+-----------+----------+--------------+ RIGHT    CompressibilityPhasicitySpontaneityPropertiesThrombus Aging +---------+---------------+---------+-----------+----------+--------------+ CFV      Full           Yes      Yes                                 +---------+---------------+---------+-----------+----------+--------------+ SFJ       Full                                                        +---------+---------------+---------+-----------+----------+--------------+ FV Prox  Full                                                        +---------+---------------+---------+-----------+----------+--------------+ FV Mid   Full                                                        +---------+---------------+---------+-----------+----------+--------------+ FV DistalFull                                                        +---------+---------------+---------+-----------+----------+--------------+  PFV      Full                                                        +---------+---------------+---------+-----------+----------+--------------+ POP      Full           Yes      Yes                                 +---------+---------------+---------+-----------+----------+--------------+ PTV      Full                                                        +---------+---------------+---------+-----------+----------+--------------+ PERO     Full                                                        +---------+---------------+---------+-----------+----------+--------------+   +---------+---------------+---------+-----------+----------+--------------+ LEFT     CompressibilityPhasicitySpontaneityPropertiesThrombus Aging +---------+---------------+---------+-----------+----------+--------------+ CFV      Full           Yes      Yes                                 +---------+---------------+---------+-----------+----------+--------------+ SFJ      Full                                                        +---------+---------------+---------+-----------+----------+--------------+ FV Prox  Full                                                        +---------+---------------+---------+-----------+----------+--------------+ FV Mid   Full                                                         +---------+---------------+---------+-----------+----------+--------------+ FV DistalFull                                                        +---------+---------------+---------+-----------+----------+--------------+ PFV      Full                                                        +---------+---------------+---------+-----------+----------+--------------+  POP      Full           Yes      Yes                                 +---------+---------------+---------+-----------+----------+--------------+ PTV      Full                                                        +---------+---------------+---------+-----------+----------+--------------+ PERO     Full                                                        +---------+---------------+---------+-----------+----------+--------------+     Summary: BILATERAL: - No evidence of deep vein thrombosis seen in the lower extremities, bilaterally. -No evidence of popliteal cyst, bilaterally.   *See table(s) above for measurements and observations. Electronically signed by Deitra Mayo MD on 11/21/2021 at 1:20:55 PM.    Final    ?Recent Labs  ?  11/20/21 ?0622 11/22/21 ?4098  ?WBC 13.9* 15.1*  ?HGB 9.2* 10.0*  ?HCT 29.0* 31.7*  ?PLT 413* 388  ? ?Recent Labs  ?  11/20/21 ?0622 11/22/21 ?1191  ?NA 142 140  ?K 3.0* 4.0  ?CL 104 104  ?CO2 28 27  ?GLUCOSE 147* 107*  ?BUN 16 19  ?CREATININE 0.72 0.63  ?CALCIUM 8.6* 8.9  ? ? ?Intake/Output Summary (Last 24 hours) at 11/22/2021 0803 ?Last data filed at 11/21/2021 4782 ?Gross per 24 hour  ?Intake 240 ml  ?Output --  ?Net 240 ml  ?  ? ?  ? ?Physical Exam: ?Vital Signs ?Blood pressure 108/71, pulse 84, temperature 98.2 ?F (36.8 ?C), temperature source Oral, resp. rate 16, height '5\' 1"'$  (1.549 m), weight 119.4 kg, SpO2 100 %. ? ? ? ? ? ?General: awake, alert, appropriate, sitting up in bed; delayed responses; wearing O2 but only because wore overnight; NAD ?HENT: conjugate gaze;  oropharynx moist ?CV: regular rate; no JVD ?Pulmonary: CTA B/L; no W/R/R- good air movement ?GI: soft, NT, ND, (+)BS- normoactive ?Psychiatric: appropriate ?Neurological: poor memory ?Skin: incision c/d/I- looks good ?Neuro/ Musculoskeletal: Alert and oriented x4. 5/5 strength. Sensation intact. Cervical myofascial tenderness. Tenderness bilateral knees.  ? ?Assessment/Plan: ?1. Functional deficits which require 3+ hours per day of interdisciplinary therapy in a comprehensive inpatient rehab setting. ?Physiatrist is providing close team supervision and 24 hour management of active medical problems listed below. ?Physiatrist and rehab team continue to assess barriers to discharge/monitor patient progress toward functional and medical goals ? ?Care Tool: ? ?Bathing ?   ?Body parts bathed by patient: Right arm, Left arm, Chest, Abdomen, Front perineal area, Buttocks, Right upper leg, Left upper leg, Face, Right lower leg, Left lower leg  ? Body parts bathed by helper: Right lower leg, Left lower leg ?  ?  ?Bathing assist Assist Level: Contact Guard/Touching assist ?  ?  ?Upper Body Dressing/Undressing ?Upper body dressing   ?What is the patient wearing?: Pull over shirt ?   ?Upper body assist Assist Level: Set up assist ?   ?Lower Body Dressing/Undressing ?Lower body dressing ? ? ?   ?  What is the patient wearing?: Underwear/pull up, Pants ? ?  ? ?Lower body assist Assist for lower body dressing: Supervision/Verbal cueing ?   ? ?Toileting ?Toileting    ?Toileting assist Assist for toileting: Supervision/Verbal cueing ?  ?  ?Transfers ?Chair/bed transfer ? ?Transfers assist ? Chair/bed transfer activity did not occur: N/A ? ?Chair/bed transfer assist level: Contact Guard/Touching assist ?Chair/bed transfer assistive device: Walker ?  ?Locomotion ?Ambulation ? ? ?Ambulation assist ? ?   ? ?Assist level: Contact Guard/Touching assist ?Assistive device: Walker-rolling ?Max distance: 177f  ? ?Walk 10 feet  activity ? ? ?Assist ?   ? ?Assist level: Supervision/Verbal cueing ?Assistive device: Walker-rolling  ? ?Walk 50 feet activity ? ? ?Assist   ? ?Assist level: Minimal Assistance - Patient > 75% ?   ? ? ?Walk 150 feet activity ? ? ?Assist Walk 150 feet activity did not occur: N/A ? ?Assist level: Contact Guard/Touching assist ?Assistive device: [employer]

## 2021-11-22 NOTE — Progress Notes (Signed)
Speech Language Pathology Discharge Summary ? ?Patient Details  ?Name: Virginia Luna ?MRN: [id number] ?Date of Birth: 1960-10-13 ? ?Today's Date: 11/23/2021 ?SLP Individual Time: 3704-8889 ?SLP Individual Time Calculation (min): 45 min ? ? ?Skilled Therapeutic Interventions:  Pt seen for skilled ST intervention targeting cognitive-linguistic skills and patient education re: compensatory swallowing, speech, and memory strategies. Pt alert/awake and utilizing restroom with LPN assistance. Denies pain; NAD. Agreeable to ST intervention at bedside. ? ?SLP reinforced education re: use of compensatory strategies to reduce oral residuals and aspiration risk (lingual sweep, liquid rinse, self selection of softer textures), with pt participating in teach back with Mod I. Became emotional when discussing how her speech clarity has change since her CVA. SLP offered support and spoke of pt's progress thus far, and reminded pt of speech intelligibility strategies. Completed post-treatment cognitive assessment via the COGNISTAT, which was administered at time of admission to CIR. Pt demonstrated scores that fell [facility] for delayed recall, math calculations, verbal reasoning, and verbal judgment; at time of initial evaluation, pt exhibited deficits in these domains. Pt reports she feels her cognition is approaching baseline, though feels that she is processing information at a slower pace. Pt with functional recall of all education provided with Mod I. Asked questions and commented appropriately throughout today's session. ? ?Session concluded with pt in bed, bed alarm on, call bel within reach, and all immediate needs met. Will discharge from [facility] services this date due to hospital discharge scheduled for 11/24/2021. ? ?Patient has met 5 of 5 long term goals.  Patient to discharge at overall Supervision level.  ?Reasons goals not met: N/A ? ?Clinical Impression/Discharge Summary: Pt made great progress meeting 5 out 5 goals, discharging  at supervision A with supervision-min A for emergent awareness. Pt demonstrated improvements in complex problem solving, emergent awareness, sustained attention, recall and speech intelligibility. Pt participated in functional tasks including medication/money/time management. Pt's fatigue and pain continue to impact sustained attention, continue to be a barrier at discharge. Education was completed with significant other and all questions answered to satisfaction. Pt benefited from skilled ST services in order to maximize functional independence and reduce burden of care, requiring 24 hour supervision at discharge with continued skilled OP ST services. ? ?Care Partner:  ?Caregiver Able to Provide Assistance: Yes  ?Type of Caregiver Assistance: Physical;Cognitive ? ?Recommendation:  ?Outpatient SLP;24 hour supervision/assistance  ?Rationale for SLP Follow Up: Maximize functional communication;Maximize cognitive function and independence;Reduce caregiver burden  ? ?Equipment: N/A  ? ?Reasons for discharge: Discharged from hospital  ? ?Patient/Family Agrees with Progress Made and Goals Achieved: Yes  ? ? ?MADISON  CRATCH ?11/22/2021, 2:08 PM ? ?

## 2021-11-22 NOTE — Progress Notes (Signed)
Discussed at length with pharmacy regarding patient's antibiotic allergies, urinalysis and current symptoms.  Although there is notation that she had anaphylaxis to Rocephin, she received Ancef in 2016.  Also discussed steroid therapy and was informed that elevated white blood cell count can persist up to 36 hours after cessation of all steroids.  We will proceed with fosfomycin 3 g x 1 dose.   ?

## 2021-11-22 NOTE — Progress Notes (Signed)
Physical Therapy Session Note ? ?Patient Details  ?Name: Virginia Luna ?MRN: [id number] ?Date of Birth: 1960/12/24 ? ?Today's Date: 11/22/2021 ?PT Individual Time: 5852-7782; 4235-3614 ?PT Session Time: 70 min; 35 min ? ?Short Term Goals: ?Week 1:  PT Short Term Goal 1 (Week 1): = to LTGs based on ELOS ? ? ?Skilled Therapeutic Interventions/Progress Updates:  ?Session 1: ?Pt was received seated in recliner and agreeable to therapy. Pt is continent and expressed a need to urinate. CGA for toilet transfer and pericare. Pt's boyfriend was present for family edu; he asked if managing a w/c would be necessary but PT informed him that pt is CLOF will not require her to use one and that w/c is mainly used during therpay sessions for time management and/or energy conservation. Pt is able to perform all transfers and gait with RW supervision - she is very aware of her balance and proprioceptive deficits and requires increased time to ensure she is getting full foot contact on the ground before attempting to move. Able to initiate gait training with RW CGA up/down a ramp, across uneven surface, and navigating multiple turns - with decreased step cadence and stride length. Increased time to complete turns with RW CGA but performs better turning to her R side (stronger). Initiated stairs training and educated boyfriend on guarding pt for safety on her weaker side (L) when navigating a 6-inch curb with RW CGA. Navigated 4 stairs up/down with use of both HR's (since this is what she will have at D/C) with CGA - pt and boyfriend demonstrated verbal understanding of safe ascension/descension of stairs after education. Patient demonstrates increased fall risk as noted by score of   25/56 on Berg Balance Scale.  (<36= high risk for falls, close to 100%; 37-45 significant >80%; 46-51 moderate >50%; 52-55 lower >25%); able to attempt most activities without RW but required CGA-minA to complete dynamic balance tasks; demonstrated L lean  and A-P sway with static standing balance. Pt reported an onset of increased dizziness (8/10) when initiating static standing with Berg assessment without RW CGA, as well as increased head/neck pain on the L (9/10). Symptoms settled slightly with seated rest breaks, but pt had already been premedicated for vertigo and headaches prior to session. Pt transported to room via w/c for time management. Pt left seated in recliner with all needs in reach.  ? ?Session 2: ?Pt was received seated in recliner and agreeable to therapy this afternoon which was scheduled as a make-up therapy session. Pt expressed need to urinate. CGA for toilet transfer and pericare. Supervision for transfers with RW from w/c <> mat table or recliner. Pt initiated gait training with RW at supervision level - ambulated x195 ft with improved timing and sequencing of BLE's and was able to correct herself to obtain more erect posture without cuing. Dynamic balance activity performed with elevating 1LE on a 5inch step and encouraging reaching outside BOS to clip clothes pins onto a theraband without UE support/RW and CGA for safety. Performed bilaterally to address overall stability and coordination as well as LUE/LLE weakness. Pt transported back to room via w/c for time management. Pt left in recliner with safety belt active and all needs in reach. ? ?Therapy Documentation ?Precautions:  ?Precautions ?Precautions: Fall ?Restrictions ?Weight Bearing Restrictions: No ?Balance: ?Berg Balance Test ?Sit to Stand: Able to stand  independently using hands ?Standing Unsupported: Able to stand 30 seconds unsupported ?Sitting with Back Unsupported but Feet Supported on Floor or Stool: Able to sit  safely and securely 2 minutes ?Stand to Sit: Uses backs of legs against chair to control descent ?Transfers: Able to transfer safely, definite need of hands ?Standing Unsupported with Eyes Closed: Able to stand 10 seconds with supervision ?Standing Ubsupported with  Feet Together: Able to place feet together independently but unable to hold for 30 seconds ?From Standing, Reach Forward with Outstretched Arm: Reaches forward but needs supervision ?From Standing Position, Pick up Object from Floor: Unable to try/needs assist to keep balance ?From Standing Position, Turn to Look Behind Over each Shoulder: Turn sideways only but maintains balance ?Turn 360 Degrees: Needs assistance while turning ?Standing Unsupported, Alternately Place Feet on Step/Stool: Needs assistance to keep from falling or unable to try ?Standing Unsupported, One Foot in Front: Able to take small step independently and hold 30 seconds ?Standing on One Leg: Tries to lift leg/unable to hold 3 seconds but remains standing independently ?Total Score: 25 ? ? ? ?Therapy/Group: Individual Therapy ? ?Lanetta Inch ?11/22/2021, 4:54 PM  ?

## 2021-11-22 NOTE — Progress Notes (Signed)
pt assisted with home CPAP machine,Water added to chamber ?

## 2021-11-22 NOTE — Discharge Instructions (Addendum)
Inpatient Rehab Discharge Instructions ? ?Virginia Luna ?Discharge date and time: 11/25/2021 ? ?Activities/Precautions/ Functional Status: ?Activity: no lifting, driving, or strenuous exercise for until cleared by MD ?Diet: diabetic diet ?Wound Care: keep wound clean and dry ?Functional status:  ?___ No restrictions     ___ Walk up steps independently ?___ 24/7 supervision/assistance   ___ Walk up steps with assistance ?_x__ Intermittent supervision/assistance  ___ Bathe/dress independently ?___ Walk with walker     ___ Bathe/dress with assistance ?___ Walk Independently    ___ Shower independently ?___ Walk with assistance    __x_ Shower with assistance ?__x_ No alcohol     ___ Return to work/school ________ ? ?Special Instructions: ? ?No driving, alcohol consumption or tobacco use.  ? ?My questions have been answered and I understand these instructions. I will adhere to these goals and the provided educational materials after my discharge from the hospital. ? ?Patient/Caregiver Signature _______________________________ Date __________ ? ?Clinician Signature _______________________________________ Date __________ ? ?Please bring this form and your medication list with you to all your follow-up doctor's appointments.   ? ?COMMUNITY REFERRALS UPON DISCHARGE:   ? ? ?Outpatient: PT     OT    ST     ?            Agency: [facility]    Phone: [phone] ?            Appointment Date/Time: *Please expect follow-up within 7-10 business days to schedule your appointment. If you have not received follow-up, be sure to contact the branch directly.* ? ?Medical Equipment/Items Ordered: rolling walker ?                                                Agency/Supplier: [facility] [phone] ? ? ?

## 2021-11-22 NOTE — Progress Notes (Signed)
Speech Language Pathology Daily Session Note ? ?Patient Details  ?Name: Virginia Luna ?MRN: [id number] ?Date of Birth: [birth year] ? ?Today's Date: 11/22/2021 ?SLP Individual Time: 5747-3403 ?SLP Individual Time Calculation (min): 58 min ? ?Short Term Goals: ?Week 1: SLP Short Term Goal 1 (Week 1): Pt will utilize 2 out of 4 memory strategies to recall functional information re: iADL's given Min verbal A. ?SLP Short Term Goal 2 (Week 1): Pt will complete basic + functional problem-solving tasks re: pill organizer, money, and scheduling to prepare for discharge given Min A for use of attention and executive functioning strategies. ?SLP Short Term Goal 3 (Week 1): Pt will communicate wants/needs, ideas, and thoughts with 90% intelligibility given Min A for use of pacing and over-articulation strategies. ? ?Skilled Therapeutic Interventions:Skilled ST services focused on education and cognitive skills. SLP facilitated complex problem solving, error awareness and recall skills in account balancing and three scheduling tasks, pt required supervision A verbal cues for error awareness. Education was completed with pt's significant other, pertaining to need for assistance with IADLs, reduce distractions, assist with recall, higher level problem solving and recall. Family was agreeable to provide assistance and asked appropriate questions. Pt was left in room with family, call bell within reach and chair alarm set. SLP recommends to continue skilled services. ?   ? ?Pain ?Pain Assessment ?Pain Score: 0-No pain ? ?Therapy/Group: Individual Therapy ? ?Katja Blue ?11/22/2021, 2:01 PM ?

## 2021-11-22 NOTE — Progress Notes (Signed)
Patient ID: Estelle June, female   DOB: 1960/10/13, 61 y.o.   MRN: [id number] ? ?SW faxed outpatient PT/OT/SLP referral to [facility] ([phone]/f:[phone]).  ? ?Loralee Pacas, MSW, LCSWA ?Office: [phone] ?Cell: [phone] ?Fax: [phone]  ?

## 2021-11-22 NOTE — Progress Notes (Signed)
Occupational Therapy Session Note ? ?Patient Details  ?Name: Virginia Luna ?MRN: [id number] ?Date of Birth: 1961/04/23 ? ?Today's Date: 11/22/2021 ?OT Individual Time: [phone] ?OT Individual Time Calculation (min): 59 min  ? ? ?Short Term Goals: ?Week 1:  OT Short Term Goal 1 (Week 1): STG = LTG due to ELOS ? ?Skilled Therapeutic Interventions/Progress Updates:  ?Skilled OT intervention completed with focus on family education regarding TTB transfers and shower safety with pt's boyfriend present. Pt received upright in bed, agreeable to session. Pt requested to bathe prior to tub transfer, with pt able to transition to EOB with supervision, then complete all bathing dressing with supervision at the sit <> stand level with RW. Pt did require safety cues to prevent leaning too far to one side to prevent falling off the bed, as well as during pericare, however pt with good demonstration of self-care independence. ? ?All transfers completed with supervision at the short ambulatory transfers level with RW. Transported in w/c with total A <> ADL bathroom. Completed supervised ambulatory transfer with RW then sit > pivot tub transfer on tub bench, with boyfriend demonstrating ability to safely supervise and instruct pt with safety cues during in/out of tub. Education provided about the following: ?-Installation specifics for how to set up TTB with suction cup legs inside the shower ?-Suggestions for managing legs via gait belt or using pants then doffing clothes ?-Shower curtain management ?-Transfer safety with showers ?-Grab bar/hand held shower nozzle ?(Provided a handout with all of these for recall) ? ?Pt completed stand pivot with RW to recliner, then was left upright in recliner, with BLE elevated, and alarm belt left off of her due to pt's boyfriend still present and pt's next therapist planning to arrive promptly. All needs met at end of session. ?  ? ?Therapy Documentation ?Precautions:   ?Precautions ?Precautions: Fall ?Restrictions ?Weight Bearing Restrictions: No ? ?Pain: ?No c/o pain ? ?Therapy/Group: Individual Therapy ? ?[name] E Abimelec Grochowski ?11/22/2021, 7:28 AM ?

## 2021-11-23 DIAGNOSIS — D496 Neoplasm of unspecified behavior of brain: Secondary | ICD-10-CM | POA: Diagnosis not present

## 2021-11-23 LAB — GLUCOSE, CAPILLARY
Glucose-Capillary: 99 mg/dL (ref 70–99)
Glucose-Capillary: 121 mg/dL — ABNORMAL HIGH (ref 70–99)
Glucose-Capillary: 152 mg/dL — ABNORMAL HIGH (ref 70–99)
Glucose-Capillary: 178 mg/dL — ABNORMAL HIGH (ref 70–99)

## 2021-11-23 NOTE — Progress Notes (Signed)
Physical Therapy Session Note ? ?Patient Details  ?Name: Virginia Luna ?MRN: [id number] ?Date of Birth: [birth year] ? ?Today's Date: 11/23/2021 ?PT Individual Time: 3403-7096 ?PT Individual Time Calculation (min): 69 min  ? ?Short Term Goals: ?Week 1:  PT Short Term Goal 1 (Week 1): = to LTGs based on ELOS ? ?Skilled Therapeutic Interventions/Progress Updates: Pt presented in recliner agreeable to therapy. Pt denies pain at rest but did c/o neck soreness towards end of session. Rrest breaks provided as needed during session. Pt requesting to use bathroom prior to leaving room. Performed ambulatory transfer to bathroom with supervision and performed 3/3 toileting tasks at supervision/mod I level. Pt then ambulated to sink for hand hygiene with RW and supervision. Pt ambulated to ortho gym for car transfer and performed with supervision overall. Pt then ambulated to rehab gym supervision level and participated in transfers, and picking up objects, ambulation on compliant surface, and balance activities all performed at supervision to mod I level. Pt also performed step onto 5in step with RW only at supervision level. Pt ambulated to ADL apt and performed bed mobility on standard bed mod I. Pt returned to room at end of session and used toilet prior to settling in room. Toileting tasks performed as above. Pt returned to recliner at end of session with call bell within reach and needs met.  ?   ? ?Therapy Documentation ?Precautions:  ?Precautions ?Precautions: Fall ?Restrictions ?Weight Bearing Restrictions: No ?General: ?  ?Vital Signs: ?Therapy Vitals ?Temp: 97.9 ?F (36.6 ?C) ?Temp Source: Oral ?Pulse Rate: 94 ?Resp: 18 ?BP: 120/67 ?Patient Position (if appropriate): Sitting ?Oxygen Therapy ?SpO2: 94 % ?O2 Device: Room Air ?Pain: ?  ?Mobility: ?Bed Mobility ?Bed Mobility: Sit to Supine;Supine to Sit ?Supine to Sit: Independent with assistive device ?Sit to Supine: Independent with assistive device ?Transfers ?Transfers:  Sit to Stand;Stand to [employer] Transfers ?Sit to Stand: Independent with assistive device ?Stand to Sit: Independent with assistive device ?Stand Pivot Transfers: Independent with assistive device ?Transfer (Assistive device): Rolling walker ?Locomotion : ?Gait ?Ambulation: Yes ?Gait Assistance: Supervision/Verbal cueing ?Gait Distance (Feet): 200 Feet ?Assistive device: Rolling walker ?Gait Assistance Details: decreased self selected speed, with mild increased L lean ?Gait ?Gait: Yes ?Gait Pattern: Impaired ?Gait Pattern: Step-through pattern;Decreased step length - left;Decreased step length - right;Decreased hip/knee flexion - right;Decreased hip/knee flexion - left ?Gait velocity: decreased ?Stairs / Additional Locomotion ?Stairs: Yes ?Stairs Assistance: Contact Guard/Touching assist ?Stair Management Technique: Two rails;No rails ?Number of Stairs: 4 ?Height of Stairs: 6 ?Curb: Supervision/Verbal cueing ?Wheelchair Mobility ?Wheelchair Mobility: No  ?Trunk/Postural Assessment : ?Cervical Assessment ?Cervical Assessment: Exceptions to [facility] ?Thoracic Assessment ?Thoracic Assessment: Within Functional Limits ?Lumbar Assessment ?Lumbar Assessment: Within Functional Limits ?Postural Control ?Postural Control: Deficits on evaluation ?Trunk Control: WFL in sitting ?Protective Responses: delayed  ?Balance: ?Balance ?Balance Assessed: Yes ?Static Sitting Balance ?Static Sitting - Balance Support: Feet supported;No upper extremity supported ?Static Sitting - Level of Assistance: 6: Modified independent (Device/Increase time) ?Dynamic Sitting Balance ?Dynamic Sitting - Balance Support: During functional activity ?Dynamic Sitting - Level of Assistance: 6: Modified independent (Device/Increase time) ?Static Standing Balance ?Static Standing - Balance Support: Bilateral upper extremity supported;During functional activity ?Static Standing - Level of Assistance: 6: Modified independent (Device/Increase time) ?Dynamic  Standing Balance ?Dynamic Standing - Balance Support: During functional activity ?Dynamic Standing - Level of Assistance: 5: Stand by assistance ?Exercises: ?  ?Other Treatments:   ? ? ? ?Therapy/Group: Individual Therapy ? ?Ariea Rochin ?11/23/2021, 4:08 PM  ?

## 2021-11-23 NOTE — Progress Notes (Signed)
Occupational Therapy Discharge Summary ? ?Patient Details  ?Name: Virginia Luna ?MRN: [id number] ?Date of Birth: 1961/06/09 ? ? ? ? ?Patient has met 9 of 9 long term goals due to improved activity tolerance, improved balance, postural control, ability to compensate for deficits, improved awareness, and improved coordination.  Pt made steady progress with BADLs/IADLs and functional transfers during this admission.All functional transfers (toilet and TTB) with supervision using RW. Bathing at shower level and EOB with supervision. Dressing using RW for sit<>stand with mod I. Simple kitchen tasks with supervision. Pt's family has been present for educatoin and provides the appropriate level of supervisoin/assistance.Pt is Patient to discharge at overall  supervision to mod I   level.  Patient's care partner is independent to provide the necessary physical assistance at discharge.   ? ?Reasons goals not met: n/a ? ?Recommendation:  ?Patient will benefit from ongoing skilled OT services in outpatient setting to continue to advance functional skills in the area of BADL and Reduce care partner burden. ? ?Equipment: ?No equipment provided ? ?Reasons for discharge: treatment goals met and discharge from hospital ? ?Patient/family agrees with progress made and goals achieved: Yes ? ?OT Discharge ? ?ADL ?ADL ?Eating: Supervision/safety ?Where Assessed-Eating: Chair ?Grooming: Independent ?Where Assessed-Grooming: Edge of bed ?Upper Body Bathing: Independent ?Where Assessed-Upper Body Bathing: Edge of bed ?Lower Body Bathing: Supervision/safety ?Where Assessed-Lower Body Bathing: Edge of bed ?Upper Body Dressing: Independent ?Where Assessed-Upper Body Dressing: Edge of bed ?Lower Body Dressing: Modified independent ?Where Assessed-Lower Body Dressing: Edge of bed ?Toileting: Supervision/safety ?Where Assessed-Toileting: Toilet ?Toilet Transfer: Distant supervision ?Toilet Transfer Method: Ambulating ?Science writer:  Grab bars ?Tub/Shower Transfer: Close supervison ?Tub/Shower Transfer Method: Ambulating ?Tub/Shower Equipment: Radio broadcast assistant ?Walk-In Shower Transfer: Unable to assess ?Walk-In Shower Transfer Method: Unable to assess ?Vision ?Baseline Vision/History: 1 Wears glasses ?Patient Visual Report: Blurring of vision ?Vision Assessment?: Yes ?Eye Alignment: Within Functional Limits ?Ocular Range of Motion: Within Functional Limits ?Tracking/Visual Pursuits: Decreased smoothness of vertical tracking;Decreased smoothness of horizontal tracking ?Saccades: Decreased speed of saccadic movement;Overshoots ?Convergence: Impaired (comment) ?Visual Fields: No apparent deficits ?Perception  ?Perception: Within Functional Limits ?Praxis ?Praxis: Intact ?Cognition ?Cognition ?Overall Cognitive Status: Within Functional Limits for tasks assessed ?Arousal/Alertness: Awake/alert ?Orientation Level: Person;Place;Situation ?Person: Oriented ?Place: Oriented ?Situation: Oriented ?Memory: Impaired ?Memory Impairment: Retrieval deficit;Decreased recall of new information ?Attention: Sustained ?Focused Attention: Appears intact ?Sustained Attention: Appears intact ?Sustained Attention Impairment: Functional basic ?Selective Attention: Impaired ?Selective Attention Impairment: Functional basic ?Awareness: Impaired ?Awareness Impairment: Emergent impairment ?Safety/Judgment: Appears intact ?Brief Interview for Mental Status (BIMS) ?Repetition of Three Words (First Attempt): 3 ?Temporal Orientation: Year: Correct ?Temporal Orientation: Month: Accurate within 5 days ?Temporal Orientation: Day: Correct ?Recall: "Sock": Yes, no cue required ?Recall: "Blue": Yes, no cue required ?Recall: "Bed": Yes, no cue required ?BIMS Summary Score: 15 ?Sensation ?Sensation ?Light Touch: Appears Intact ?Hot/Cold: Appears Intact ?Proprioception: Appears Intact ?Stereognosis: Not tested ?Coordination ?Fine Motor Movements are Fluid and Coordinated: No ?Finger  Nose Finger Test: delayed; dysmetria bilateral ?Motor  ?Motor ?Motor: Other (comment);Ataxia ?Motor - Skilled Clinical Observations: generalized weakness/deconditioning and ataxia ?Mobility  ?   ?Trunk/Postural Assessment  ?Cervical Assessment ?Cervical Assessment: Exceptions to [facility] (limited range 2/2 surgery) ?Thoracic Assessment ?Thoracic Assessment: Within Functional Limits ?Lumbar Assessment ?Lumbar Assessment: Within Functional Limits ?Postural Control ?Trunk Control: WFL in sitting  ?Balance ?Static Sitting Balance ?Static Sitting - Balance Support: Feet supported;No upper extremity supported ?Static Sitting - Level of Assistance: 6: Modified independent (Device/Increase time) ?Dynamic Sitting Balance ?Dynamic Sitting -  Balance Support: During functional activity ?Dynamic Sitting - Level of Assistance: 6: Modified independent (Device/Increase time) ?Extremity/Trunk Assessment ?RUE Assessment ?RUE Assessment: Exceptions to [facility] ?Active Range of Motion (AROM) Comments: 90 degrees shoulder flexion ?General Strength Comments: 3+/5 grossly ?LUE Assessment ?LUE Assessment: Exceptions to [facility] ?Active Range of Motion (AROM) Comments: 90 degrees shoulder flexion ?General Strength Comments: grossly 3+/5 ? ? ?Leroy Libman ?11/23/2021, 9:29 AM ?

## 2021-11-23 NOTE — Progress Notes (Signed)
Occupational Therapy Session Note ? ?Patient Details  ?Name: Virginia Luna ?MRN: [id number] ?Date of Birth: Jan 09, 1961 ? ?Today's Date: 11/23/2021 ?OT Individual Time: 7858-8502 ?OT Individual Time Calculation (min): 70 min  ? ? ?Short Term Goals: ?Week 1:  OT Short Term Goal 1 (Week 1): STG = LTG due to ELOS ? ?Skilled Therapeutic Interventions/Progress Updates:  ?  Pt resting in bed upon arrival and ready to get OOB and prepare for the day. Pt amb with RW to bathroom and completed toileting tasks with supervision. Bathing with supervision at EOB and sit<>stand with RW. Dressing mod I with sit<>stand from EOB using RW. Pt pleased with progress and ready for discharge home tomorrow. Pt requires more then a reasonable amount of time to complete tasks. No unsafe behaviors noted during session. Pt remained in recliner with all needs within reach. Seat alarm activated. ? ?Therapy Documentation ?Precautions:  ?Precautions ?Precautions: Fall ?Restrictions ?Weight Bearing Restrictions: No ? ?Pain: ? Pt reports a "really bag" migraine; MD and RN aware ? ?Therapy/Group: Individual Therapy ? ?Leroy Libman ?11/23/2021, 9:30 AM ?

## 2021-11-23 NOTE — Plan of Care (Signed)
?  Problem: RH Expression Communication ?Goal: LTG Patient will increase speech intelligibility (SLP) ?Description: LTG: Patient will increase speech intelligibility at word/phrase/conversation level with cues, % of the time (SLP) ?Outcome: Completed/Met ?  ?Problem: RH Problem Solving ?Goal: LTG Patient will demonstrate problem solving for (SLP) ?Description: LTG:  Patient will demonstrate problem solving for basic/complex daily situations with cues  (SLP) ?Outcome: Completed/Met ?  ?Problem: RH Memory ?Goal: LTG Patient will demonstrate ability for day to day (SLP) ?Description: LTG:   Patient will demonstrate ability for day to day recall/carryover during cognitive/linguistic activities with assist  (SLP) ?Outcome: Completed/Met ?  ?Problem: RH Attention ?Goal: LTG Patient will demonstrate this level of attention during functional activites (SLP) ?Description: LTG:  Patient will will demonstrate this level of attention during functional activites (SLP) ?Outcome: Completed/Met ?  ?Problem: RH Awareness ?Goal: LTG: Patient will demonstrate awareness during functional activites type of (SLP) ?Description: LTG: Patient will demonstrate awareness during functional activites type of (SLP) ?Outcome: Completed/Met ?  ?

## 2021-11-23 NOTE — Plan of Care (Signed)
?  Problem: Consults ?Goal: RH BRAIN INJURY PATIENT EDUCATION ?Description: Description: See Patient Education module for eduction specifics ?Outcome: Progressing ?Goal: Skin Care Protocol Initiated - if Braden Score 18 or less ?Description: If consults are not indicated, leave blank or document N/A ?Outcome: Progressing ?Goal: Diabetes Guidelines if Diabetic/Glucose > 140 ?Description: If diabetic or lab glucose is > 140 mg/dl - Initiate Diabetes/Hyperglycemia Guidelines & Document Interventions  ?Outcome: Progressing ?  ?Problem: RH BOWEL ELIMINATION ?Goal: RH STG MANAGE BOWEL WITH ASSISTANCE ?Description: STG Manage Bowel with Supervision Assistance. ?Outcome: Progressing ?Goal: RH STG MANAGE BOWEL W/MEDICATION W/ASSISTANCE ?Description: STG Manage Bowel with Medication with Supervision Assistance. ?Outcome: Progressing ?  ?Problem: RH BLADDER ELIMINATION ?Goal: RH STG MANAGE BLADDER WITH ASSISTANCE ?Description: STG Manage Bladder With Supervision Assistance ?Outcome: Progressing ?Goal: RH STG MANAGE BLADDER WITH MEDICATION WITH ASSISTANCE ?Description: STG Manage Bladder With Medication With Supervision Assistance. ?Outcome: Progressing ?  ?Problem: RH SKIN INTEGRITY ?Goal: RH STG MAINTAIN SKIN INTEGRITY WITH ASSISTANCE ?Description: STG Maintain Skin Integrity With Supervision Assistance. ?Outcome: Progressing ?Goal: RH STG ABLE TO PERFORM INCISION/WOUND CARE W/ASSISTANCE ?Description: STG Able To Perform Incision/Wound Care With Supervision Assistance. ?Outcome: Progressing ?  ?Problem: RH SAFETY ?Goal: RH STG ADHERE TO SAFETY PRECAUTIONS W/ASSISTANCE/DEVICE ?Description: STG Adhere to Safety Precautions With cues and reminders. ?Outcome: Progressing ?Goal: RH STG DECREASED RISK OF FALL WITH ASSISTANCE ?Description: STG Decreased Risk of Fall With supervision Assistance. ?Outcome: Progressing ?  ?Problem: RH COGNITION-NURSING ?Goal: RH STG USES MEMORY AIDS/STRATEGIES W/ASSIST TO PROBLEM SOLVE ?Description: STG  Uses Memory Aids/Strategies With supervision Assistance to Problem Solve. ?Outcome: Progressing ?Goal: RH STG ANTICIPATES NEEDS/CALLS FOR ASSIST W/ASSIST/CUES ?Description: STG Anticipates Needs/Calls for Assist With Cues and reminders. ?Outcome: Progressing ?  ?Problem: RH PAIN MANAGEMENT ?Goal: RH STG PAIN MANAGED AT OR BELOW PT'S PAIN GOAL ?Description: < 3 on a 0-10 pain scale. ?Outcome: Progressing ?  ?Problem: RH KNOWLEDGE DEFICIT BRAIN INJURY ?Goal: RH STG INCREASE KNOWLEDGE OF SELF CARE AFTER BRAIN INJURY ?Description: Patient will demonstrate knowledge of self-care, medication/pain management, skin/wound care with educational materials and handouts provided by staff independently at discharge. ?Outcome: Progressing ?  ?

## 2021-11-23 NOTE — Progress Notes (Signed)
Patient ID: Virginia Luna, female   DOB: 09/07/1960, 61 y.o.   MRN: [id number] ? ?SW called pt s/o Herbert to follow-up about family edu. Reports things went well. He states pt will need RW. Discussed discharge process and follow-up therapies. He is aware all information for f/u appts will be in discharge instructions. No further questions/concerns.  ? ?SW ordered RW with [place] via parachute.  ? ?*SW met with pt to discuss above. No questions/concerns reported.  ? ?Loralee Pacas, MSW, LCSWA ?Office: [phone] ?Cell: [phone] ?Fax: [phone]  ?

## 2021-11-23 NOTE — Progress Notes (Signed)
?                                                       PROGRESS NOTE ? ? ?Subjective/Complaints: ? ?Pt reports has a migraine really bad this AM- ?Sounds like could be from pressure/storm coming.  ? ?Also, given Fosfomycin for UTI.  ? ? ?ROS: ? ?Pt denies SOB, abd pain, CP, N/V/C/D, and vision changes ? ?Objective: ?  ?VAS [name] LOWER EXTREMITY VENOUS (DVT) ? ?Result Date: 11/21/2021 ? Lower Venous DVT Study Patient Name:  Virginia Luna  Date of Exam:   11/21/2021 Medical Rec #: [id number]          Accession #:    [id number] Date of Birth: Dec 22, 1960           Patient Gender: F Patient Age:   61 years Exam Location:  [facility] Procedure:      VAS [name] LOWER EXTREMITY VENOUS (DVT) Referring Phys: Macy Lingenfelter --------------------------------------------------------------------------------  Indications: Pain.  Comparison Study: no prior Performing Technologist: Archie Patten RVS  Examination Guidelines: A complete evaluation includes B-mode imaging, spectral Doppler, color Doppler, and power Doppler as needed of all accessible portions of each vessel. Bilateral testing is considered an integral part of a complete examination. Limited examinations for reoccurring indications may be performed as noted. The reflux portion of the exam is performed with the patient in reverse Trendelenburg.  +---------+---------------+---------+-----------+----------+--------------+ RIGHT    CompressibilityPhasicitySpontaneityPropertiesThrombus Aging +---------+---------------+---------+-----------+----------+--------------+ CFV      Full           Yes      Yes                                 +---------+---------------+---------+-----------+----------+--------------+ SFJ      Full                                                        +---------+---------------+---------+-----------+----------+--------------+ FV Prox  Full                                                         +---------+---------------+---------+-----------+----------+--------------+ FV Mid   Full                                                        +---------+---------------+---------+-----------+----------+--------------+ FV DistalFull                                                        +---------+---------------+---------+-----------+----------+--------------+ PFV      Full                                                        +---------+---------------+---------+-----------+----------+--------------+  POP      Full           Yes      Yes                                 +---------+---------------+---------+-----------+----------+--------------+ PTV      Full                                                        +---------+---------------+---------+-----------+----------+--------------+ PERO     Full                                                        +---------+---------------+---------+-----------+----------+--------------+   +---------+---------------+---------+-----------+----------+--------------+ LEFT     CompressibilityPhasicitySpontaneityPropertiesThrombus Aging +---------+---------------+---------+-----------+----------+--------------+ CFV      Full           Yes      Yes                                 +---------+---------------+---------+-----------+----------+--------------+ SFJ      Full                                                        +---------+---------------+---------+-----------+----------+--------------+ FV Prox  Full                                                        +---------+---------------+---------+-----------+----------+--------------+ FV Mid   Full                                                        +---------+---------------+---------+-----------+----------+--------------+ FV DistalFull                                                         +---------+---------------+---------+-----------+----------+--------------+ PFV      Full                                                        +---------+---------------+---------+-----------+----------+--------------+ POP      Full           Yes      Yes                                 +---------+---------------+---------+-----------+----------+--------------+  PTV      Full                                                        +---------+---------------+---------+-----------+----------+--------------+ PERO     Full                                                        +---------+---------------+---------+-----------+----------+--------------+     Summary: BILATERAL: - No evidence of deep vein thrombosis seen in the lower extremities, bilaterally. -No evidence of popliteal cyst, bilaterally.   *See table(s) above for measurements and observations. Electronically signed by Deitra Mayo MD on 11/21/2021 at 1:20:55 PM.    Final    ?Recent Labs  ?  11/22/21 ?7829  ?WBC 15.1*  ?HGB 10.0*  ?HCT 31.7*  ?PLT 388  ? ?Recent Labs  ?  11/22/21 ?5621  ?NA 140  ?K 4.0  ?CL 104  ?CO2 27  ?GLUCOSE 107*  ?BUN 19  ?CREATININE 0.63  ?CALCIUM 8.9  ? ? ?Intake/Output Summary (Last 24 hours) at 11/23/2021 0916 ?Last data filed at 11/22/2021 1825 ?Gross per 24 hour  ?Intake 600 ml  ?Output --  ?Net 600 ml  ?  ? ?  ? ?Physical Exam: ?Vital Signs ?Blood pressure (!) 153/93, pulse 84, temperature 97.9 ?F (36.6 ?C), temperature source Oral, resp. rate 18, height '5\' 1"'$  (1.549 m), weight 121.1 kg, SpO2 100 %. ? ? ? ? ? ? ?General: awake, alert, appropriate, sitting up in bed; nurse in room taking to bathroom; NAD ?HENT: conjugate gaze; oropharynx moist ?CV: regular rate; no JVD ?Pulmonary: CTA B/L; no W/R/R- good air movement ?GI: soft, NT, ND, (+)BS ?Psychiatric: appropriate- vague ?Neurological: alert- c/o light/sound sensitivity- eyes blinking a lot right now- poor memory ?Skin: incision c/d/I- looks  good ?Neuro/ Musculoskeletal: Alert and oriented x4. 5/5 strength. Sensation intact. Cervical myofascial tenderness. Tenderness bilateral knees.  ? ?Assessment/Plan: ?1. Functional deficits which require 3+ hours per day of interdisciplinary therapy in a comprehensive inpatient rehab setting. ?Physiatrist is providing close team supervision and 24 hour management of active medical problems listed below. ?Physiatrist and rehab team continue to assess barriers to discharge/monitor patient progress toward functional and medical goals ? ?Care Tool: ? ?Bathing ?   ?Body parts bathed by patient: Right arm, Left arm, Chest, Abdomen, Front perineal area, Buttocks, Right upper leg, Left upper leg, Right lower leg, Left lower leg, Face  ? Body parts bathed by helper: Right lower leg, Left lower leg ?  ?  ?Bathing assist Assist Level: Supervision/Verbal cueing ?  ?  ?Upper Body Dressing/Undressing ?Upper body dressing   ?What is the patient wearing?: Pull over shirt ?   ?Upper body assist Assist Level: Set up assist ?   ?Lower Body Dressing/Undressing ?Lower body dressing ? ? ?   ?What is the patient wearing?: Underwear/pull up, Pants ? ?  ? ?Lower body assist Assist for lower body dressing: Supervision/Verbal cueing ?   ? ?Toileting ?Toileting    ?Toileting assist Assist for toileting: Supervision/Verbal cueing ?  ?  ?Transfers ?Chair/bed transfer ? ?Transfers assist ? Chair/bed transfer activity did not occur: N/A ? ?Chair/bed transfer assist  level: Supervision/Verbal cueing ?Chair/bed transfer assistive device: Walker ?  ?Locomotion ?Ambulation ? ? ?Ambulation assist ? ?   ? ?Assist level: Supervision/Verbal cueing ?Assistive device: Walker-rolling ?Max distance: 152f  ? ?Walk 10 feet activity ? ? ?Assist ?   ? ?Assist level: Supervision/Verbal cueing ?Assistive device: Walker-rolling  ? ?Walk 50 feet activity ? ? ?Assist   ? ?Assist level: Supervision/Verbal cueing ?Assistive device: Walker-rolling  ? ? ?Walk 150 feet  activity ? ? ?Assist Walk 150 feet activity did not occur: N/A ? ?Assist level: Supervision/Verbal cueing ?Assistive device: Walker-rolling ?  ? ?Walk 10 feet on uneven surface  ?activity ? ? ?Assist Walk 10 feet on uneven surfaces activity did not occur: Safety/medical concerns ? ? ?Assist level: Contact Guard/Touching assist ?Assistive device: Walker-rolling  ? ?Wheelchair ? ? ? ? ?Assist Is the patient using a wheelchai

## 2021-11-23 NOTE — Progress Notes (Signed)
Inpatient Rehabilitation Discharge Medication Review by a Pharmacist ? ?A complete drug regimen review was completed for this patient to identify any potential clinically significant medication issues. ? ?High Risk Drug Classes Is patient taking? Indication by Medication  ?Antipsychotic Yes Seroquel- mood disorder  ?Anticoagulant No   ?Antibiotic No   ?Opioid No   ?Antiplatelet No   ?Hypoglycemics/insulin Yes Ozempic- T2DM  ?Vasoactive Medication Yes Amlodipine- hypertension  ?Chemotherapy No   ?Other Yes Lipitor- HLD ?Trileptal- seizure prophylaxis ?Singulair- Asthma ?Protonix- GERD  ? ? ? ?Type of Medication Issue Identified Description of Issue Recommendation(s)  ?Drug Interaction(s) (clinically significant) ?    ?Duplicate Therapy ?    ?Allergy ?    ?No Medication Administration End Date ?    ?Incorrect Dose ?    ?Additional Drug Therapy Needed ?    ?Significant med changes from prior encounter (inform family/care partners about these prior to discharge).    ?Other ?    ? ? ?Clinically significant medication issues were identified that warrant physician communication and completion of prescribed/recommended actions by midnight of the next day:  No ? ?Time spent performing this drug regimen review (minutes):  30 ? ? ?Chareese Sergent BS, PharmD, BCPS ?Clinical Pharmacist ?11/24/2021 9:03 AM ?

## 2021-11-24 DIAGNOSIS — D496 Neoplasm of unspecified behavior of brain: Secondary | ICD-10-CM | POA: Diagnosis not present

## 2021-11-24 LAB — BASIC METABOLIC PANEL
Anion gap: 8 (ref 5–15)
BUN: 13 mg/dL (ref 6–20)
CO2: 28 mmol/L (ref 22–32)
Calcium: 8.9 mg/dL (ref 8.9–10.3)
Chloride: 108 mmol/L (ref 98–111)
Creatinine, Ser: 0.57 mg/dL (ref 0.44–1.00)
GFR, Estimated: >60 mL/min (ref >60)
Glucose, Bld: 118 mg/dL — ABNORMAL HIGH (ref 70–99)
Potassium: 3.9 mmol/L (ref 3.5–5.1)
Sodium: 144 mmol/L (ref 135–145)

## 2021-11-24 LAB — CBC WITH DIFFERENTIAL/PLATELET
Abs Immature Granulocytes: 0.13 10*3/uL — ABNORMAL HIGH (ref 0.00–0.07)
Basophils Absolute: 0.1 10*3/uL (ref 0.0–0.1)
Basophils Relative: 1 %
Eosinophils Absolute: 0.2 10*3/uL (ref 0.0–0.5)
Eosinophils Relative: 3 %
HCT: 32.4 % — ABNORMAL LOW (ref 36.0–46.0)
Hemoglobin: 10.2 g/dL — ABNORMAL LOW (ref 12.0–15.0)
Immature Granulocytes: 1 %
Lymphocytes Relative: 24 %
Lymphs Abs: 2.3 10*3/uL (ref 0.7–4.0)
MCH: 26.7 pg (ref 26.0–34.0)
MCHC: 31.5 g/dL (ref 30.0–36.0)
MCV: 84.8 fL (ref 80.0–100.0)
Monocytes Absolute: 0.6 10*3/uL (ref 0.1–1.0)
Monocytes Relative: 6 %
Neutro Abs: 6.1 10*3/uL (ref 1.7–7.7)
Neutrophils Relative %: 65 %
Platelets: 283 10*3/uL (ref 150–400)
RBC: 3.82 MIL/uL — ABNORMAL LOW (ref 3.87–5.11)
RDW: 17.4 % — ABNORMAL HIGH (ref 11.5–15.5)
WBC: 9.4 10*3/uL (ref 4.0–10.5)
nRBC: 0 % (ref 0.0–0.2)

## 2021-11-24 LAB — URINE CULTURE: Culture: >=100000 — AB

## 2021-11-24 LAB — GLUCOSE, CAPILLARY
Glucose-Capillary: 118 mg/dL — ABNORMAL HIGH (ref 70–99)
Glucose-Capillary: 141 mg/dL — ABNORMAL HIGH (ref 70–99)

## 2021-11-24 MED ORDER — PANTOPRAZOLE SODIUM 40 MG PO TBEC: 40.0000 mg | DELAYED_RELEASE_TABLET | Freq: Every day | ORAL | 0 refills | Status: DC

## 2021-11-24 MED ORDER — ONDANSETRON 4 MG PO TBDP: 4.0000 mg | ORAL_TABLET | Freq: Four times a day (QID) | ORAL | 0 refills | Status: DC | PRN

## 2021-11-24 MED ORDER — ALBUTEROL SULFATE (2.5 MG/3ML) 0.083% IN NEBU
2.5000 mg | INHALATION_SOLUTION | Freq: Four times a day (QID) | RESPIRATORY_TRACT | 0 refills | Status: DC | PRN
Start: 2021-11-24 — End: 2022-11-28

## 2021-11-24 MED ORDER — LEVOCETIRIZINE DIHYDROCHLORIDE 5 MG PO TABS: 5.0000 mg | ORAL_TABLET | Freq: Every evening | ORAL | 0 refills | Status: DC

## 2021-11-24 MED ORDER — MONTELUKAST SODIUM 10 MG PO TABS: 10.0000 mg | ORAL_TABLET | Freq: Every day | ORAL | 0 refills | Status: DC

## 2021-11-24 MED ORDER — ACETAMINOPHEN 325 MG PO TABS
325.0000 mg | ORAL_TABLET | ORAL | Status: DC | PRN
Start: 2021-11-24 — End: 2024-04-08

## 2021-11-24 MED ORDER — POLYETHYLENE GLYCOL 3350 17 G PO PACK: 17.0000 g | PACK | Freq: Every day | ORAL | 0 refills | Status: DC | PRN

## 2021-11-24 MED ORDER — MECLIZINE HCL 25 MG PO TABS: 25.0000 mg | ORAL_TABLET | Freq: Three times a day (TID) | ORAL | 0 refills | Status: DC | PRN

## 2021-11-24 MED ORDER — MELATONIN 5 MG PO TABS: 5.0000 mg | ORAL_TABLET | Freq: Every day | ORAL | 0 refills | Status: DC

## 2021-11-24 MED ORDER — ZOLPIDEM TARTRATE 10 MG PO TABS: 10.0000 mg | ORAL_TABLET | Freq: Every day | ORAL | 5 refills | Status: DC

## 2021-11-24 MED ORDER — OXCARBAZEPINE 300 MG PO TABS: 900.0000 mg | ORAL_TABLET | Freq: Every day | ORAL | 0 refills | Status: DC

## 2021-11-24 MED ORDER — AMLODIPINE BESYLATE 5 MG PO TABS: 5.0000 mg | ORAL_TABLET | Freq: Every day | ORAL | 0 refills | Status: DC

## 2021-11-24 MED ORDER — SUMATRIPTAN SUCCINATE 100 MG PO TABS: 100.0000 mg | ORAL_TABLET | ORAL | 0 refills | Status: DC | PRN

## 2021-11-24 MED ORDER — METHOCARBAMOL 750 MG PO TABS: 750.0000 mg | ORAL_TABLET | Freq: Three times a day (TID) | ORAL | 0 refills | Status: DC | PRN

## 2021-11-24 MED ORDER — AZELASTINE HCL 0.1 % NA SOLN: 1.0000 | Freq: Two times a day (BID) | NASAL | 0 refills | Status: DC | PRN

## 2021-11-24 MED ORDER — SENNOSIDES-DOCUSATE SODIUM 8.6-50 MG PO TABS: 2.0000 | ORAL_TABLET | Freq: Every day | ORAL | 0 refills | Status: DC

## 2021-11-24 MED ORDER — QUETIAPINE FUMARATE 300 MG PO TABS: 300.0000 mg | ORAL_TABLET | Freq: Every day | ORAL | 0 refills | Status: DC

## 2021-11-24 MED ORDER — ATORVASTATIN CALCIUM 10 MG PO TABS: 10.0000 mg | ORAL_TABLET | Freq: Every day | ORAL | 0 refills | Status: DC

## 2021-11-24 NOTE — Progress Notes (Signed)
Inpatient Rehabilitation Care Coordinator ?Discharge Note  ? ?Patient Details  ?Name: Virginia Luna ?MRN: [id number] ?Date of Birth: [birth year] ? ? ?Discharge location: D/c to home ? ?Length of Stay: 9 days ? ?Discharge activity level: Supervision ? ?Home/community participation: Limited ? ?Patient response YB:OFBPZW Literacy - How often do you need to have someone help you when you read instructions, pamphlets, or other written material from your doctor or pharmacy?: Never ? ?Patient response CH:ENIDPO Isolation - How often do you feel lonely or isolated from those around you?: Never ? ?Services provided included: MD, RD, PT, OT, SLP, RN, Pharmacy, Neuropsych, SW, TR, CM ? ?Financial Services:  ?Charity fundraiser Utilized: Private Insurance ?[employer] Medicare ? ?Choices offered to/list presented to: yes ? ?Follow-up services arranged:  ?Outpatient, DME ?   ?Outpatient Servicies: [facility] for PT/OT/SLP ?DME : [facility] for RW ?  ? ?Patient response to transportation need: ?Is the patient able to respond to transportation needs?: Yes ?In the past 12 months, has lack of transportation kept you from medical appointments or from getting medications?: No ?In the past 12 months, has lack of transportation kept you from meetings, work, or from getting things needed for daily living?: No ? ?Comments (or additional information): ? ?Patient/Family verbalized understanding of follow-up arrangements:  Yes ? ?Individual responsible for coordination of the follow-up plan: Contact pt s/o Herbert ? ?Confirmed correct DME delivered: Rana Snare 11/24/2021   ? ?Rana Snare ?

## 2021-11-24 NOTE — Progress Notes (Signed)
?                                                       PROGRESS NOTE ? ? ?Subjective/Complaints: ? ?Pt reports migraine again this AM- imitrex helped.  ? ? ? ?ROS: ? ? ?Pt denies SOB, abd pain, CP, N/V/C/D, and vision changes ? ?Objective: ?  ?No results found. ?Recent Labs  ?  11/22/21 ?1601 11/24/21 ?0932  ?WBC 15.1* 9.4  ?HGB 10.0* 10.2*  ?HCT 31.7* 32.4*  ?PLT 388 283  ? ?Recent Labs  ?  11/22/21 ?3557 11/24/21 ?3220  ?NA 140 144  ?K 4.0 3.9  ?CL 104 108  ?CO2 27 28  ?GLUCOSE 107* 118*  ?BUN 19 13  ?CREATININE 0.63 0.57  ?CALCIUM 8.9 8.9  ? ? ?Intake/Output Summary (Last 24 hours) at 11/24/2021 0958 ?Last data filed at 11/24/2021 0900 ?Gross per 24 hour  ?Intake 716 ml  ?Output --  ?Net 716 ml  ?  ? ?  ? ?Physical Exam: ?Vital Signs ?Blood pressure 122/72, pulse 86, temperature 98.3 ?F (36.8 ?C), temperature source Oral, resp. rate 18, height '5\' 1"'$  (1.549 m), weight 121.1 kg, SpO2 98 %. ? ? ? ? ? ? ? ?General: awake, alert, appropriate, sitting up in bed; c/o migraineNAD ?HENT: conjugate gaze; oropharynx moist ?CV: regular rate; no JVD ?Pulmonary: CTA B/L; no W/R/R- good air movement ?GI: soft, NT, ND, (+)BS ?Psychiatric: appropriate ?Neurological: vague; poor memory ?Skin: incision c/d/I- looks good- glued- no sutures/staples ?Neuro/ Musculoskeletal: Alert and oriented x4. 5/5 strength. Sensation intact. Cervical myofascial tenderness. Tenderness bilateral knees.  ? ?Assessment/Plan: ?1. Functional deficits which require 3+ hours per day of interdisciplinary therapy in a comprehensive inpatient rehab setting. ?Physiatrist is providing close team supervision and 24 hour management of active medical problems listed below. ?Physiatrist and rehab team continue to assess barriers to discharge/monitor patient progress toward functional and medical goals ? ?Care Tool: ? ?Bathing ?   ?Body parts bathed by patient: Right arm, Left arm, Chest, Abdomen, Front perineal area, Buttocks, Right upper leg, Left upper leg, Right  lower leg, Left lower leg, Face  ? Body parts bathed by helper: Right lower leg, Left lower leg ?  ?  ?Bathing assist Assist Level: Set up assist ?  ?  ?Upper Body Dressing/Undressing ?Upper body dressing   ?What is the patient wearing?: Pull over shirt, Bra ?   ?Upper body assist Assist Level: Independent ?   ?Lower Body Dressing/Undressing ?Lower body dressing ? ? ?   ?What is the patient wearing?: Underwear/pull up, Pants ? ?  ? ?Lower body assist Assist for lower body dressing: Independent with assitive device ?   ? ?Toileting ?Toileting    ?Toileting assist Assist for toileting: Supervision/Verbal cueing ?  ?  ?Transfers ?Chair/bed transfer ? ?Transfers assist ? Chair/bed transfer activity did not occur: N/A ? ?Chair/bed transfer assist level: Independent with assistive device ?Chair/bed transfer assistive device: Walker ?  ?Locomotion ?Ambulation ? ? ?Ambulation assist ? ?   ? ?Assist level: Supervision/Verbal cueing ?Assistive device: Walker-rolling ?Max distance: 258f  ? ?Walk 10 feet activity ? ? ?Assist ?   ? ?Assist level: Supervision/Verbal cueing ?Assistive device: Walker-rolling  ? ?Walk 50 feet activity ? ? ?Assist   ? ?Assist level: Supervision/Verbal cueing ?Assistive device: Walker-rolling  ? ? ?Walk 150 feet  activity ? ? ?Assist Walk 150 feet activity did not occur: N/A ? ?Assist level: Supervision/Verbal cueing ?Assistive device: Walker-rolling ?  ? ?Walk 10 feet on uneven surface  ?activity ? ? ?Assist Walk 10 feet on uneven surfaces activity did not occur: Safety/medical concerns ? ? ?Assist level: Supervision/Verbal cueing ?Assistive device: Walker-rolling  ? ?Wheelchair ? ? ? ? ?Assist Is the patient using a wheelchair?: No ?Type of Wheelchair: Manual ?Wheelchair activity did not occur: Safety/medical concerns ? ?Wheelchair assist level: Dependent - Patient 0% ?   ? ? ?Wheelchair 50 feet with 2 turns activity ? ? ? ?Assist ? ?  ?  ? ? ?Assist Level: Dependent - Patient 0%  ? ?Wheelchair 150  feet activity  ? ? ? ?Assist ?   ? ? ?Assist Level: Dependent - Patient 0%  ? ?Blood pressure 122/72, pulse 86, temperature 98.3 ?F (36.8 ?C), temperature source Oral, resp. rate 18, height '5\' 1"'$  (1.549 m), weight 121.1 kg, SpO2 98 %. ? ?Medical Problem List and Plan: ?1. 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            -patient may shower but incision must be covered ?            -ELOS/Goals: 10-14 days S ?            d/c set for 4/7 ? Cd/c today- has f/u with me Monday  ?2.  Antithrombotics: ?-DVT/anticoagulation:  Mechanical: Sequential compression devices, below knee Bilateral lower extremities ?            -antiplatelet therapy: none ?3. Postoperative pain and hx of Fibromyalgia: continue Tylenol, Norco, Robaxin 750 mg PRN ? 4/6- pain stable except migraine- already got meds- has imetrex as needed-  ?4. Mood: LCSW to evaluate and provide emotional support ?            -antipsychotic agents: Seroquel ?5. Neuropsych: This patient is capable of making decisions on her own behalf. ?6. Skin/Wound Care: Routine skin care checks ?7. Fluids/Electrolytes/Nutrition: Routine I's and O's and follow-up chemistries ?8: Cerebellar tumor s/p craniectomy: continue Trileptal 900 mg q HS ?            --meclizine prn  ? 3/31- had some shaking earlier today- could have been tremors?- monitor closely.  ? 4/4- asked Neuro to assess tremors- since saw at rest and with intention- asking if there's anything else to help with? ? 4/5- Neuro feels it's cerebellar and nothing to be done.  ?9: Hypertension: increase amlodipine to '10mg'$  daily ? 4/3- BP controlled-con't increased meds ?10: Hyperlipidemia: continue atorvastatin 10 mg daily ?11: Seasonal allergies: continue Xyzal, Astelin, Flonase prn ?12: Asthma: continue Singulair, Breo Ellipta, Proventil ?13: Fibromyalgia: pain management as above ?14: Migraine headache: Maxalt prn ? 4/4- HA this AM- [facility] so ordered Imitrex for pt prn ? 4/6- migraine again this AM- will make sure has imitrex as needed ? 4/7- will send home on Imitrex prn ?15: Insomnia: continue melatonin ?16: Sleep apnea: no records of CPAP/BiPap ?59: DM-2: continue CBG monitoring and carb mod diet ?            --continue semaglutide [place] every Sunday ? CBG (last 3)  ?Recent Labs  ?  11/23/21 ?1624 11/23/21 ?2116 11/24/21 ?0636  ?GLUCAP 152* 178* 118*  ?  4/5- CBGs look great- con't regimen- better as steroids weaned ? 4/7- CBGs look  good on current regimen- send home on this medicine.  ?18: Neurogenic bladder: no records ?19: vitamin D deficiency: no supplements. Check levels ? 20. Leukocytosis ? 3/30- WBC 18.6- likely due to Dexamethasone- will monitor- afebrile and no other Sx's of infection ? 3/31- will recheck Monday and monitor for S'S's of infection ? 4/3- WBC down to 13.9- afebrile- doing better with reduction in decadron ? 4/5- WBC up to 15k- will check U/A and Cx since steroids weaning down; doesn't make sense WBC up more-  ?21. Steroid dosing ? 3/31- will check into if NSU wanted a steroid wean at all . ? 4/3- weaning per NSU ? 4/4- down to 2 mg daily x 2 days-  ?22. Constipation ? 4/4- will add Miralax starting tomorrow and give sorbitol after therapy- has been 3 days and usually goes daily.  ? 4/5- had small BM per pt- documented as large per nursing report-  ?23. UTI ? 4/6- allergic to a lot- gave fosfomycin due to allergies- waiting for Cx- will recheck labs in AM to make sure WBC trending down.  ? 4/7- WBC back down to normal- Fosfomycin- working- Cx Proteus and E coli- should be appropriate ? ? ? ? ?LOS: ?9 days ?A FACE TO FACE EVALUATION WAS PERFORMED ? ?Katrece Roediger ?11/24/2021, 9:58 AM  ? ? ? ?

## 2021-11-24 NOTE — Progress Notes (Signed)
Patient discharged to home with husband. Patient and family stated that they understood all discharge instructions. ?Sanda Linger, LPN ? ?

## 2021-11-27 ENCOUNTER — Encounter: Payer: Self-pay | Admitting: Physical Medicine and Rehabilitation

## 2021-11-27 ENCOUNTER — Encounter: Payer: Medicare HMO | Attending: Physical Medicine and Rehabilitation | Admitting: Physical Medicine and Rehabilitation

## 2021-11-27 VITALS — BP 133/80 | HR 107 | Temp 98.6°F | Ht 61.0 in | Wt 280.0 lb

## 2021-11-27 DIAGNOSIS — M7918 Myalgia, other site: Secondary | ICD-10-CM | POA: Diagnosis not present

## 2021-11-27 DIAGNOSIS — D496 Neoplasm of unspecified behavior of brain: Secondary | ICD-10-CM | POA: Insufficient documentation

## 2021-11-27 DIAGNOSIS — G894 Chronic pain syndrome: Secondary | ICD-10-CM | POA: Insufficient documentation

## 2021-11-27 DIAGNOSIS — R300 Dysuria: Secondary | ICD-10-CM | POA: Diagnosis not present

## 2021-11-27 DIAGNOSIS — M797 Fibromyalgia: Secondary | ICD-10-CM | POA: Insufficient documentation

## 2021-11-27 NOTE — Progress Notes (Signed)
Pt is a 61 yr old female with hx of fibromyalgia- dx'd at age 86, DM2,- Ac1 6.2;  asthma; HTN, OSA, BMI is 50; Nodule in L superior cerebellum. Also has migraines- intractable and daily; has trace aura;  No kidney issues- Cr 0.65 and BUN 15 in 6/22. Also end stage OA of B/L knees medially and R hip pain. Also has myofascial pain syndrome.  ?Has new brain mass that was documented ?Worsening cerebellar Sx's. So had crani for cerebellar tumor removal late March 2023-  ? ?Got home Friday. ?Things a little better. Not shaking quite as much.  ? ?Wants trigger point injections.  ?Ordered Myofascial/FMS handbook- will get Tuesday.  ? ? ?Just wants to get back to being herself and that's upsetting her.  ? ? ?Feels like might have UTI- has some frequency/and tingling still. Was treated with fosfomycin in hopsital due to E Coli and Proteus.  ? ?Needs W/C handicapped placard.  ? ? ? ?Plan: ?Patient here for trigger point injections for myofascial pain ? Consent done and on chart. ? ?Cleaned areas with alcohol and injected using a 27 gauge 1.5 inch needle ? ?Injected 4cc ?Using 1% Lidocaine with no EPI ? ?Upper traps B/L  ?Levators- B/L  ?Posterior scalenes ?Middle scalenes- B/L  ?Splenius Capitus R only- due to cerebellar crani on L ?Pectoralis Major- B/L  ?Rhomboids- B/L x2 ?Infraspinatus ?Teres Major/minor ?Thoracic paraspinals ?Lumbar paraspinals ?Other injections-  ? ? ?Patient's level of pain prior was ?Current level of pain after injections is- already better ? ?There was no bleeding or complications. ? ?Patient was advised to drink a lot of water on day after injections to flush system ?Will have increased soreness for 12-48 hours after injections.  ?Can use Lidocaine patches the day AFTER injections ?Can use theracane on day of injections in places didn't inject ?Can use heating pad 4-6 hours AFTER injections  ? ?2. Handicapped placard- will fill out for 6 months for pt.  ? ? ?3. Getting UA checked by PCP today. To f/u  on UTI ? ? ?4. Getting book to learn how to work with FMS/myofascial pain- I completely agree with this plan. ? ?5. F/U in 2 months-and every 2 months' ? ?6. Needs f/u with surgeon.  ?

## 2021-11-27 NOTE — Patient Instructions (Signed)
Plan: ?Patient here for trigger point injections for myofascial pain ? Consent done and on chart. ? ?Cleaned areas with alcohol and injected using a 27 gauge 1.5 inch needle ? ?Injected 4cc ?Using 1% Lidocaine with no EPI ? ?Upper traps B/L  ?Levators- B/L  ?Posterior scalenes ?Middle scalenes- B/L  ?Splenius Capitus R only- due to cerebellar crani on L ?Pectoralis Major- B/L  ?Rhomboids- B/L x2 ?Infraspinatus ?Teres Major/minor ?Thoracic paraspinals ?Lumbar paraspinals ?Other injections-  ? ? ?Patient's level of pain prior was ?Current level of pain after injections is- already better ? ?There was no bleeding or complications. ? ?Patient was advised to drink a lot of water on day after injections to flush system ?Will have increased soreness for 12-48 hours after injections.  ?Can use Lidocaine patches the day AFTER injections ?Can use theracane on day of injections in places didn't inject ?Can use heating pad 4-6 hours AFTER injections  ? ?2. Handicapped placard- will fill out for 6 months for pt.  ? ? ?3. Getting UA checked by PCP today. To f/u on UTI ? ? ?4. Getting book to learn how to work with FMS/myofascial pain- I completely agree with this plan. ? ?5. F/U in 2 months-and every 2 months' ? ?6. Needs f/u with surgeon.  ?

## 2021-11-28 ENCOUNTER — Ambulatory Visit: Payer: Medicare HMO | Attending: Physician Assistant | Admitting: Physical Therapy

## 2021-11-28 ENCOUNTER — Encounter: Payer: Self-pay | Admitting: Physical Therapy

## 2021-11-28 ENCOUNTER — Ambulatory Visit: Payer: Medicare HMO | Admitting: Occupational Therapy

## 2021-11-28 ENCOUNTER — Encounter: Payer: Self-pay | Admitting: Occupational Therapy

## 2021-11-28 ENCOUNTER — Ambulatory Visit: Payer: Medicare HMO | Admitting: Speech Pathology

## 2021-11-28 DIAGNOSIS — R41844 Frontal lobe and executive function deficit: Secondary | ICD-10-CM | POA: Insufficient documentation

## 2021-11-28 DIAGNOSIS — R278 Other lack of coordination: Secondary | ICD-10-CM | POA: Insufficient documentation

## 2021-11-28 DIAGNOSIS — Z9181 History of falling: Secondary | ICD-10-CM | POA: Diagnosis present

## 2021-11-28 DIAGNOSIS — R4701 Aphasia: Secondary | ICD-10-CM | POA: Diagnosis present

## 2021-11-28 DIAGNOSIS — R41841 Cognitive communication deficit: Secondary | ICD-10-CM | POA: Insufficient documentation

## 2021-11-28 DIAGNOSIS — R208 Other disturbances of skin sensation: Secondary | ICD-10-CM

## 2021-11-28 DIAGNOSIS — R41840 Attention and concentration deficit: Secondary | ICD-10-CM | POA: Insufficient documentation

## 2021-11-28 DIAGNOSIS — M6281 Muscle weakness (generalized): Secondary | ICD-10-CM

## 2021-11-28 DIAGNOSIS — R471 Dysarthria and anarthria: Secondary | ICD-10-CM | POA: Diagnosis present

## 2021-11-28 DIAGNOSIS — R2689 Other abnormalities of gait and mobility: Secondary | ICD-10-CM

## 2021-11-28 DIAGNOSIS — J3089 Other allergic rhinitis: Secondary | ICD-10-CM | POA: Diagnosis not present

## 2021-11-28 DIAGNOSIS — D496 Neoplasm of unspecified behavior of brain: Secondary | ICD-10-CM | POA: Diagnosis not present

## 2021-11-28 DIAGNOSIS — R2681 Unsteadiness on feet: Secondary | ICD-10-CM | POA: Diagnosis not present

## 2021-11-28 DIAGNOSIS — Z483 Aftercare following surgery for neoplasm: Secondary | ICD-10-CM | POA: Diagnosis not present

## 2021-11-28 DIAGNOSIS — J301 Allergic rhinitis due to pollen: Secondary | ICD-10-CM | POA: Diagnosis not present

## 2021-11-28 DIAGNOSIS — J3081 Allergic rhinitis due to animal (cat) (dog) hair and dander: Secondary | ICD-10-CM | POA: Diagnosis not present

## 2021-11-28 NOTE — Therapy (Addendum)
?OUTPATIENT SPEECH LANGUAGE PATHOLOGY APHASIA EVALUATION ? ? ?Patient Name: Virginia Luna ?MRN: [id number] ?DOB:03/28/1961, 61 y.o., female ?Today's Date: 11/29/2021 ? ?PCP: Shirline Frees, MD ?REFERRING PROVIDER: Aurther Loft MD ? ? End of Session - 11/28/21 1242   ? ? Visit Number 1   ? Number of Visits 25   ? Date for SLP Re-Evaluation 02/20/22   ? Authorization Type [employer]   ? Progress Note Due on Visit 10   ? SLP Start Time 2751   ? SLP Stop Time  1015   ? SLP Time Calculation (min) 40 min   ? Activity Tolerance Patient limited by fatigue;Patient tolerated treatment well   ? ?  ?  ? ?  ? ? ?Past Medical History:  ?Diagnosis Date  ? Allergies   ? Anemia   ? Arthritis   ? Asthma   ? Back pain   ? Chronic pain   ? Complication of anesthesia   ? woke up during colonoscopy  ? Diabetes ([place])   ? Diabetes mellitus without complication ([place])   ? Edema, lower extremity   ? Fibroid   ? Fibromyalgia   ? Chronic  ? GERD (gastroesophageal reflux disease)   ? Headache   ? migraines  ? High blood pressure   ? History of hiatal hernia   ? History of stomach ulcers   ? IBS (irritable bowel syndrome)   ? Joint pain   ? Sleep apnea   ? did use cpap-lost 25lb-says she does not need it   NO CPAP  ? Wears contact lenses   ? ?Past Surgical History:  ?Procedure Laterality Date  ? BRAIN SURGERY Left 11/07/2021  ? [facility] Dr. Herschel [place]  ? BREAST BIOPSY    ? BREAST EXCISIONAL BIOPSY    ? BREAST LUMPECTOMY WITH RADIOACTIVE SEED LOCALIZATION Bilateral 11/24/2014  ? Procedure: BILATERAL BREAST LUMPECTOMY WITH RADIOACTIVE SEED LOCALIZATION;  Surgeon: [name], MD;  Location: [place];  Service: General;  Laterality: Bilateral;  ? CHOLECYSTECTOMY    ? COLONOSCOPY    ? DILATION AND CURETTAGE OF UTERUS    ? ESOPHAGOGASTRODUODENOSCOPY (EGD) WITH PROPOFOL N/A 07/23/2016  ? Procedure: ESOPHAGOGASTRODUODENOSCOPY (EGD) WITH PROPOFOL;  Surgeon: Laurence Spates, MD;  Location: [facility];   Service: Endoscopy;  Laterality: N/A;  ? ESOPHAGOGASTRODUODENOSCOPY (EGD) WITH PROPOFOL N/A 06/11/2018  ? Procedure: ESOPHAGOGASTRODUODENOSCOPY (EGD) WITH PROPOFOL;  Surgeon: Laurence Spates, MD;  Location: [facility];  Service: Endoscopy;  Laterality: N/A;  ? LUMBAR LAMINECTOMY  2010  ? ORIF WRIST FRACTURE Right 11/24/2020  ? Procedure: OPEN REDUCTION INTERNAL FIXATION RIGHT WRIST FRACTURE;  Surgeon: Renette Butters, MD;  Location: [facility];  Service: Orthopedics;  Laterality: Right;  ? UMBILICAL HERNIA REPAIR    ? age 57  ? UPPER GI ENDOSCOPY    ? ?Patient Active Problem List  ? Diagnosis Date Noted  ? Cerebellar tumor ([place]) 11/15/2021  ? Comorbid sleep-related hypoventilation 10/19/2021  ? Urinary incontinence/areflexic bladder 10/05/2021  ? OSA, unable to tolerate CPAP (vomited every night) 09/05/2021  ? Chronic pain syndrome 09/05/2021  ? Chronic nausea 09/05/2021  ? Neurogenic bladder, followed by Urology, instructed I&O cath BID 09/05/2021  ? Intolerance of continuous positive airway pressure (CPAP) ventilation 09/03/2021  ? Insomnia secondary to chronic pain 07/24/2021  ? Primary osteoarthritis of right knee 07/03/2021  ? Myofascial pain dysfunction syndrome 07/03/2021  ? Fibromyalgia 07/03/2021  ? Hyponatremia 07/03/2021  ? Primary osteoarthritis of left knee 07/03/2021  ? Body  mass index (BMI) 50.0-59.9, adult ([place]) 03/07/2021  ? Allergic rhinitis due to animal (cat) (dog) hair and dander 10/06/2020  ? Allergic rhinitis due to pollen 10/06/2020  ? Moderate persistent asthma, uncomplicated 36/14/4315  ? Cerebellar mass 10/06/2020  ? Paresthesia 05/27/2020  ? Diabetes mellitus ([place]) 05/25/2020  ? Hypertension associated with diabetes ([place]) 05/25/2020  ? Hyperlipidemia associated with type 2 diabetes mellitus ([place]) 05/25/2020  ? Vitamin D deficiency 05/25/2020  ? NAFLD (nonalcoholic fatty liver disease) 05/25/2020  ? Sciatica of left side 05/25/2020  ? ? ?ONSET DATE: March 2023 (surgery  11/07/2021) ? ?REFERRING DIAG: D49.6 (ICD-10-CM) - Cerebellar tumor ([place])  ? ?THERAPY DIAG:  ?Dysarthria and anarthria ? ?Cognitive communication deficit ? ?Aphasia ? ?SUBJECTIVE:  ? ?SUBJECTIVE STATEMENT: ?"I am extra fatigued today because I had to get up for different pain meds last night." ?Pt accompanied by: self ? ?PERTINENT HISTORY: On 11/07/2021, pt underwent left suboccipital craniotomy, resection of cerebellar tumor. Postoperative MRI performed on 3/22 revealed a small area of diffusion restriction in the left cerebellum. Patient transferred to CIR on 11/15/2021, discharged 11/24/2021 per pt. ST skilled treatment focused on cognition, dysarthria, dysphagia.  ?PMH significant for fibromyalgia, DM2, asthma, HTN, OSA, migraines, bilateral knee OA, myofascial pain syndrome ? ?PAIN:  ?Are you having pain? Yes: NPRS scale: 5/10 ?Pain location: head and neck ?Pain description: aching ?Aggravating factors: "I don't know" ?Relieving factors: medication some ? ?FALLS: Has patient fallen in last 6 months?  Yes (pt has PT eval this date) ? ?LIVING ENVIRONMENT: ?Lives with: lives with their spouse typically. Currently staying with God-daughter.  ?Lives in: House/apartment ? ?PLOF:  ?Level of assistance: Needed assistance with ADLs, Needed assistance with IADLS, Comment: pt reports 2/2 physical limitations ?Employment: On disability ? ? ?PATIENT GOALS "to articulate and to be comfortable and processing" ? ?OBJECTIVE:  ? ?COGNITION: ?Overall cognitive status: Impaired ?Areas of impairment: Exectutive Fx, Attention, Memory, and Problem solving ?Comments: Pt primary c/o processing, tells ST thinking feels "fuzzy" requires excess time to process auditory stimuli ? ?AUDITORY COMPREHENSION: ?Overall auditory comprehension: Appears intact ?YES/NO questions: Impaired: complex ?Following directions: Appears intact ?Conversation: Moderately Complex ?Interfering components: attention, pain, motor planning, processing speed, and working  memory ?Effective technique: extra processing time, pausing, repetition/stressing words, and slowed speech ? ? ?READING COMPREHENSION: Intact ? ?EXPRESSION: verbal ? ?VERBAL EXPRESSION: ?Level of generative/spontaneous verbalization: phrase and sentence ?Automatic speech: month of year: impaired  ?Repetition: Appears intact ?Naming: Confrontation: 76-100% ?Pragmatics: Appears intact ?Comments: ST notes that pt has moments of using vague language (e.g. "normal he does it" with no context for "he"). Difficulty citing months of year having to do it x2 times prior to being able to recall "October." Pt endorses word finding difficulties.  ?Interfering components: attention and processing ?Effective technique: open ended questions and sentence completion ?Non-verbal means of communication: N/A ? ?WRITTEN EXPRESSION: ?Dominant hand: right ? Written expression: Appears intact ? ?MOTOR SPEECH: ?Overall motor speech: impaired ?Level of impairment: Word, Phrase, Sentence, and Conversation ?Respiration: clavicular breathing ?Phonation: normal ?Resonance: WFL ?Articulation: Impaired: word, phrase, sentence, and conversation ?Intelligibility: Intelligible ?Motor planning: Appears intact ?Motor speech errors: aware ?Interfering components:  no overt barriers ?Effective technique: slow rate, increased vocal intensity, and over articulate ? ? ?ORAL MOTOR EXAMINATION ?Facial : Symmetry impaired: Impaired right ?Lingual: WFL ?[place]: Comment: unable to observe ?Mandible: WFL ?Cough: Weak ?Voice: Hoarse ? ?STANDARDIZED ASSESSMENTS: ?Assess pt's functioning regarding primary areas of concern for pt, processing and speech production, using portions of Quick Aphasia Battery  and Tikofsky's 50 Word Intelligibility Test.  ? ?Pt oriented to self, location, date, and situation. Able to follow 2 step directions with increased processing. 10/12 for y/n ? of increasing complexity. Pt noted to have to repeat back to self to process auditorily  presented stimuli during questions and following directions.  ? ?Intelligible 20/20 bisyllabic words. However, noted to have imprecise articulation and reduced rate of speech. ? ? PATIENT REPORTED OUTCOME MEASURES (PROM): ?Comm

## 2021-11-28 NOTE — Therapy (Signed)
?OUTPATIENT OCCUPATIONAL THERAPY NEURO EVALUATION ? ?Patient Name: Virginia Luna ?MRN: [id number] ?DOB:1961-04-28, 61 y.o., female ?Today's Date: 11/28/2021 ? ?PCP: Shirline Frees, MD ?REFERRING PROVIDER:Dr. Lovorn/ Risa Grill PA-C ? ? OT End of Session - 11/28/21 1014   ? ? Visit Number 1   ? Number of Visits 25   ? Date for OT Re-Evaluation 02/20/22   ? Authorization Type [employer]   ? Authorization - Visit Number 1   ? Authorization - Number of Visits 10   ? Progress Note Due on Visit 10   ? OT Start Time 1105   ? OT Stop Time 1145   ? OT Time Calculation (min) 40 min   ? Behavior During Therapy [facility] for tasks assessed/performed   ? ?  ?  ? ?  ? ? ?Past Medical History:  ?Diagnosis Date  ? Allergies   ? Anemia   ? Arthritis   ? Asthma   ? Back pain   ? Chronic pain   ? Complication of anesthesia   ? woke up during colonoscopy  ? Diabetes ([place])   ? Diabetes mellitus without complication ([place])   ? Edema, lower extremity   ? Fibroid   ? Fibromyalgia   ? Chronic  ? GERD (gastroesophageal reflux disease)   ? Headache   ? migraines  ? High blood pressure   ? History of hiatal hernia   ? History of stomach ulcers   ? IBS (irritable bowel syndrome)   ? Joint pain   ? Sleep apnea   ? did use cpap-lost 25lb-says she does not need it   NO CPAP  ? Wears contact lenses   ? ?Past Surgical History:  ?Procedure Laterality Date  ? BRAIN SURGERY Left 11/07/2021  ? [facility] Dr. Herschel [place]  ? BREAST BIOPSY    ? BREAST EXCISIONAL BIOPSY    ? BREAST LUMPECTOMY WITH RADIOACTIVE SEED LOCALIZATION Bilateral 11/24/2014  ? Procedure: BILATERAL BREAST LUMPECTOMY WITH RADIOACTIVE SEED LOCALIZATION;  Surgeon: [name], MD;  Location: [place];  Service: General;  Laterality: Bilateral;  ? CHOLECYSTECTOMY    ? COLONOSCOPY    ? DILATION AND CURETTAGE OF UTERUS    ? ESOPHAGOGASTRODUODENOSCOPY (EGD) WITH PROPOFOL N/A 07/23/2016  ? Procedure: ESOPHAGOGASTRODUODENOSCOPY (EGD) WITH PROPOFOL;  Surgeon: Laurence Spates,  MD;  [facility];  Service: Endoscopy;  Laterality: N/A;  ? ESOPHAGOGASTRODUODENOSCOPY (EGD) WITH PROPOFOL N/A 06/11/2018  ? Procedure: ESOPHAGOGASTRODUODENOSCOPY (EGD) WITH PROPOFOL;  Surgeon: Laurence Spates, MD;  [facility];  Service: Endoscopy;  Laterality: N/A;  ? LUMBAR LAMINECTOMY  2010  ? ORIF WRIST FRACTURE Right 11/24/2020  ? Procedure: OPEN REDUCTION INTERNAL FIXATION RIGHT WRIST FRACTURE;  Surgeon: Renette Butters, MD;  Location: [facility];  Service: Orthopedics;  Laterality: Right;  ? UMBILICAL HERNIA REPAIR    ? age 75  ? UPPER GI ENDOSCOPY    ? ?Patient Active Problem List  ? Diagnosis Date Noted  ? Cerebellar tumor ([place]) 11/15/2021  ? Comorbid sleep-related hypoventilation 10/19/2021  ? Urinary incontinence/areflexic bladder 10/05/2021  ? OSA, unable to tolerate CPAP (vomited every night) 09/05/2021  ? Chronic pain syndrome 09/05/2021  ? Chronic nausea 09/05/2021  ? Neurogenic bladder, followed by Urology, instructed I&O cath BID 09/05/2021  ? Intolerance of continuous positive airway pressure (CPAP) ventilation 09/03/2021  ? Insomnia secondary to chronic pain 07/24/2021  ? Primary osteoarthritis of right knee 07/03/2021  ? Myofascial pain dysfunction syndrome 07/03/2021  ? Fibromyalgia 07/03/2021  ? Hyponatremia 07/03/2021  ?  Primary osteoarthritis of left knee 07/03/2021  ? Body mass index (BMI) 50.0-59.9, adult ([place]) 03/07/2021  ? Allergic rhinitis due to animal (cat) (dog) hair and dander 10/06/2020  ? Allergic rhinitis due to pollen 10/06/2020  ? Moderate persistent asthma, uncomplicated 56/43/3295  ? Cerebellar mass 10/06/2020  ? Paresthesia 05/27/2020  ? Diabetes mellitus ([place]) 05/25/2020  ? Hypertension associated with diabetes ([place]) 05/25/2020  ? Hyperlipidemia associated with type 2 diabetes mellitus ([place]) 05/25/2020  ? Vitamin D deficiency 05/25/2020  ? NAFLD (nonalcoholic fatty liver disease) 05/25/2020  ? Sciatica of left side 05/25/2020  ? ? ?ONSET DATE:  11/07/21 ? ?REFERRING DIAG: cerebellar tumor, s/p craniotomy ? ?THERAPY DIAG:  ?Muscle weakness (generalized) - Plan: Ot plan of care cert/re-cert ? ?Other abnormalities of gait and mobility - Plan: Ot plan of care cert/re-cert ? ?Unsteadiness on feet - Plan: Ot plan of care cert/re-cert ? ?Other lack of coordination - Plan: Ot plan of care cert/re-cert ? ?Frontal lobe and executive function deficit - Plan: Ot plan of care cert/re-cert ? ?Attention and concentration deficit - Plan: Ot plan of care cert/re-cert ? ?Other disturbances of skin sensation - Plan: Ot plan of care cert/re-cert ? ?SUBJECTIVE:  ? ?SUBJECTIVE STATEMENT: ?Pt reports she is currently living with her god dtr ?Pt accompanied by: self ? ?PERTINENT HISTORY:  Virginia Luna is a 61 year old female who recently presented to hospitalized at [facility] complaining of headaches and tremors. 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 She was admitted 11/07/2021, underwent left suboccipital craniotomy, resection of cerebellar tumor. Postoperative MRI performed on 3/22 revealed a small area of diffusion restriction in the left cerebellum Pt was transferred to  [facility] 11/15/21 and  she was d/c home 11/24/21.PMH: arthritis, asthma, chronic pain, DM, fibromyalgia, myofascial pain syndrome, HTN, IBS, sleep apnea Brain ? ? MRI 09/02/21:"Increased size of left superior cerebellar vermis mass, now measuring 1.5 x 1.5 cm, previously 1.1 x 1.0 cm. Increased mild surrounding edema."  Future brain MRI ordered w/o date visible at this time. ? ?PRECAUTIONS: Fall ? ?WEIGHT BEARING RESTRICTIONS No ? ?PAIN:  ?Are you having pain? Yes: NPRS scale: 9/10 ?Pain location: had and neck ?Pain description: constant ?Aggravating factors: surgery ?Relieving factors: meds ? ?FALLS: Has patient fallen in last 6 months? No ? ?LIVING ENVIRONMENT: ?Lives with: lives with god dtr ?Lives in:  House/apartment ?Stairs:  1 steps, normally pt lives with her significant other however she has 3 steps to enter. At her house she has intermittant assist from her significant other. ?Has following equipment at home: Gilford Rile - 2 wheeled and shower chair ? ?PLOF: Independent, on disability ? ?PATIENT GOALS to do things fo myself ? ?OBJECTIVE:  ? ?HAND DOMINANCE: Right ? ?ADLs: ?Overall ADLs: Supervision for safety ?Transfers/ambulation related to ADLs: ?Eating: mod I with feeding, needs assist with cutting food ?Grooming: needs assist with styling hair ?UB Dressing: supervision/ set up ?LB Dressing: supervision/ set up ?Toileting: supervision ?Bathing: supervision ?Tub Shower transfers: min A ?Equipment: Transfer tub bench ? ? ?IADLs: ?Shopping: dependent ?Light housekeeping: dependent ?Meal Prep: dependent ?Community mobility: amb with RW with supervision ?Medication management: family assists ?Financial management: Pt is performing with assistance ?Handwriting: 90% legible and Increased time ? ?MOBILITY STATUS: Needs Assist: supervision and RW ? ? ?ACTIVITY TOLERANCE: ?Activity tolerance: inconsistent performance, pt reports being very fatigued because she had a bad night, Pt reports it took her 1 hour to dress today ? ? ? ?UE  ROM   elbow wrist and Hand WFLS ? ?Active ROM Right ?11/28/2021 Left ?11/28/2021  ?Shoulder flexion 130 110  ?Shoulder abduction    ?Shoulder adduction    ?Shoulder extension    ?Shoulder internal rotation    ?Shoulder external rotation    ?Elbow flexion    ?Elbow extension    ?Wrist flexion    ?Wrist extension    ?Wrist ulnar deviation    ?Wrist radial deviation    ?Wrist pronation    ?Wrist supination    ?(Blank rows = not tested) ? ? ?UE MMT:   RUE grossly 3+/5, LUE grossly 3/5-3+/5 ? ?MMT Right ?11/28/2021 Left ?11/28/2021  ?Shoulder flexion    ?Shoulder abduction    ?Shoulder adduction    ?Shoulder extension    ?Shoulder internal rotation    ?Shoulder external rotation    ?Middle trapezius     ?Lower trapezius    ?Elbow flexion    ?Elbow extension    ?Wrist flexion    ?Wrist extension    ?Wrist ulnar deviation    ?Wrist radial deviation    ?Wrist pronation    ?Wrist supination    ?(Blank rows = not tested) ? ?HAND FUNCTION:

## 2021-11-28 NOTE — Therapy (Signed)
?OUTPATIENT PHYSICAL THERAPY NEURO EVALUATION ? ? ?Patient Name: Virginia Luna ?MRN: [id number] ?DOB:03-Oct-1960, 61 y.o., female ?Today's Date: 11/28/2021 ? ?PCP: Shirline Frees, MD ?REFERRING PROVIDER: Courtney Heys, MD  ? ? PT End of Session - 11/28/21 0857   ? ? Visit Number 1   ? Number of Visits 13   12+eval  ? Date for PT Re-Evaluation 02/23/22   ? Authorization Type [employer] H[employer]   ? PT Start Time [phone]   ? PT Stop Time 0929   ? PT Time Calculation (min) 39 min   ? Equipment Utilized During Treatment Gait belt   ? Activity Tolerance Patient limited by fatigue;Patient tolerated treatment well   ? Behavior During Therapy [facility] for tasks assessed/performed   ? ?  ?  ? ?  ? ? ?Past Medical History:  ?Diagnosis Date  ? Allergies   ? Anemia   ? Arthritis   ? Asthma   ? Back pain   ? Chronic pain   ? Complication of anesthesia   ? woke up during colonoscopy  ? Diabetes ([place])   ? Diabetes mellitus without complication ([place])   ? Edema, lower extremity   ? Fibroid   ? Fibromyalgia   ? Chronic  ? GERD (gastroesophageal reflux disease)   ? Headache   ? migraines  ? High blood pressure   ? History of hiatal hernia   ? History of stomach ulcers   ? IBS (irritable bowel syndrome)   ? Joint pain   ? Sleep apnea   ? did use cpap-lost 25lb-says she does not need it   NO CPAP  ? Wears contact lenses   ? ?Past Surgical History:  ?Procedure Laterality Date  ? BRAIN SURGERY Left 11/07/2021  ? [facility] Dr. Herschel [place]  ? BREAST BIOPSY    ? BREAST EXCISIONAL BIOPSY    ? BREAST LUMPECTOMY WITH RADIOACTIVE SEED LOCALIZATION Bilateral 11/24/2014  ? Procedure: BILATERAL BREAST LUMPECTOMY WITH RADIOACTIVE SEED LOCALIZATION;  Surgeon: [name], MD;  Location: [place];  Service: General;  Laterality: Bilateral;  ? CHOLECYSTECTOMY    ? COLONOSCOPY    ? DILATION AND CURETTAGE OF UTERUS    ? ESOPHAGOGASTRODUODENOSCOPY (EGD) WITH PROPOFOL N/A 07/23/2016  ? Procedure: ESOPHAGOGASTRODUODENOSCOPY (EGD) WITH PROPOFOL;   Surgeon: Laurence Spates, MD;  [facility];  Service: Endoscopy;  Laterality: N/A;  ? ESOPHAGOGASTRODUODENOSCOPY (EGD) WITH PROPOFOL N/A 06/11/2018  ? Procedure: ESOPHAGOGASTRODUODENOSCOPY (EGD) WITH PROPOFOL;  Surgeon: Laurence Spates, MD;  [facility];  Service: Endoscopy;  Laterality: N/A;  ? LUMBAR LAMINECTOMY  2010  ? ORIF WRIST FRACTURE Right 11/24/2020  ? Procedure: OPEN REDUCTION INTERNAL FIXATION RIGHT WRIST FRACTURE;  Surgeon: Renette Butters, MD;  [facility];  Service: Orthopedics;  Laterality: Right;  ? UMBILICAL HERNIA REPAIR    ? age 77  ? UPPER GI ENDOSCOPY    ? ?Patient Active Problem List  ? Diagnosis Date Noted  ? Cerebellar tumor ([place]) 11/15/2021  ? Comorbid sleep-related hypoventilation 10/19/2021  ? Urinary incontinence/areflexic bladder 10/05/2021  ? OSA, unable to tolerate CPAP (vomited every night) 09/05/2021  ? Chronic pain syndrome 09/05/2021  ? Chronic nausea 09/05/2021  ? Neurogenic bladder, followed by Urology, instructed I&O cath BID 09/05/2021  ? Intolerance of continuous positive airway pressure (CPAP) ventilation 09/03/2021  ? Insomnia secondary to chronic pain 07/24/2021  ? Primary osteoarthritis of right knee 07/03/2021  ? Myofascial pain dysfunction syndrome 07/03/2021  ? Fibromyalgia 07/03/2021  ? Hyponatremia 07/03/2021  ?  Primary osteoarthritis of left knee 07/03/2021  ? Body mass index (BMI) 50.0-59.9, adult ([place]) 03/07/2021  ? Allergic rhinitis due to animal (cat) (dog) hair and dander 10/06/2020  ? Allergic rhinitis due to pollen 10/06/2020  ? Moderate persistent asthma, uncomplicated 09/38/1829  ? Cerebellar mass 10/06/2020  ? Paresthesia 05/27/2020  ? Diabetes mellitus ([place]) 05/25/2020  ? Hypertension associated with diabetes ([place]) 05/25/2020  ? Hyperlipidemia associated with type 2 diabetes mellitus ([place]) 05/25/2020  ? Vitamin D deficiency 05/25/2020  ? NAFLD (nonalcoholic fatty liver disease) 05/25/2020  ? Sciatica of left side 05/25/2020   ? ? ?ONSET DATE: 11/22/2021 (referral) ? ?REFERRING DIAG: D49.6 (ICD-10-CM) - Neoplasm of unspecified behavior of brain  ? ?THERAPY DIAG:  ?Aftercare following surgery for neoplasm ? ?Other abnormalities of gait and mobility ? ?Muscle weakness (generalized) ? ?Unsteadiness on feet ? ?SUBJECTIVE:  ?                                                                                                                                                                                           ? ?SUBJECTIVE STATEMENT: ?Pt states everything has been difficult following surgery.  "I did not sleep well last night and knowing I'm not me right now is hard."  "The left leg drags, but I can bend the knee." ?Pt accompanied by: self and God-daughter Caryl Pina in waiting room ? ?PERTINENT HISTORY: From H&P note on 11/15/2021:  "On the day of admission 11/07/2021, the patient was taken to the operating room by Dr. Recardo Evangelist of neurosurgery and underwent left suboccipital craniotomy, resection of cerebellar tumor. She tolerated the procedure well. Postoperative MRI performed on 3/22 revealed a small area of diffusion restriction in the left cerebellum."  Patient transferred to CIR on 11/15/2021, discharged 11/24/2021 per pt.  ?PMH:  fibromyalgia, DM2, asthma, HTN, OSA, migraines, bilateral knee OA, myofascial pain syndrome ? ?PAIN:  ?Are you having pain? No-took tylenol ? ?PRECAUTIONS: Fall ? ?WEIGHT BEARING RESTRICTIONS No ? ?FALLS: Has patient fallen in last 6 months? Yes. Number of falls >10 ? ?LIVING ENVIRONMENT: ?Lives with: lives with their partner and currently staying with god-daughter because of recommendations of rehab ?Lives in: House/apartment ?Stairs: Yes: External: 1 steps; none ?Has following equipment at home: Gilford Rile - 2 wheeled, shower chair, and Grab bars ? ?PLOF: Requires assistive device for independence, Needs assistance with ADLs, Needs assistance with homemaking, and Needs assistance with transfers ? ?PATIENT GOALS  "improve everything" ? ?OBJECTIVE:  ? ?DIAGNOSTIC FINDINGS: From Brain MRI on 09/02/2021:  "Increased size of left superior cerebellar vermis mass, now ?measuring 1.5 x 1.5 cm, previously 1.1 x 1.0 cm. Increased mild  surrounding edema."  Future brain MRI ordered w/o date visible at this time. ? ?COGNITION: ?Overall cognitive status: No family/caregiver present to determine baseline cognitive functioning - pt states "I feel stupid", she elaborates that she is having a hard time processing. ?  ?SENSATION: ?Light touch: WFL ? ?COORDINATION: ?Too weak to assess heel-to-shin ?LE RAMPS:  impaired L>R ? ?EDEMA:  ?Mild bilateral pitting edema 1+ ? ?MUSCLE TONE: none noted bilaterally ? ? ?POSTURE: rounded shoulders, forward head, increased thoracic kyphosis, posterior pelvic tilt, weight shift left, and Pt holds head to R, trunk to left ? ?LE ROM:    ? ?Active  Right ?11/28/2021 Left ?11/28/2021  ?Hip flexion Lifts to 100 deg Cannot lift past 95 deg  ?Hip extension    ?Hip abduction [facility] WFL  ?Hip adduction WFL; limited due to body habitus WFL; limited due to body habitus  ?Hip internal rotation    ?Hip external rotation    ?Knee flexion [facility] WFL  ?Knee extension [facility] WFL  ?Ankle dorsiflexion [facility] WFL  ?Ankle plantarflexion [facility] WFL  ?Ankle inversion    ?Ankle eversion    ? (Blank rows = not tested) ? ?MMT:   ? ?MMT Right ?11/28/2021 Left ?11/28/2021  ?Hip flexion 2+/5 2/5  ?Hip extension    ?Hip abduction 3+/5 3/5  ?Hip adduction 3/5 3/5  ?Hip internal rotation    ?Hip external rotation    ?Knee flexion 3+/5 3/5  ?Knee extension 3+/5 3+/5  ?Ankle dorsiflexion 3+/5 3+/5  ?Ankle plantarflexion    ?Ankle inversion    ?Ankle eversion    ?(Blank rows = not tested) ? ?BED MOBILITY:  ?Sit to supine Min A ?Supine to sit Min A ?She can intermittently get out of bed herself, easier getting out than in. ? ?TRANSFERS: ?Assistive device utilized: Environmental consultant - 2 wheeled  ?Sit to stand: CGA ?Stand to sit: CGA ?Chair to chair: CGA ?Pt requires inc time  to engage to task ? ?GAIT: ?Gait pattern: step through pattern, decreased stride length, decreased hip/knee flexion- Right, decreased hip/knee flexion- Left, trunk flexed, and wide BOS ?Distance walked: varying

## 2021-11-29 NOTE — Addendum Note (Signed)
Addended by: Elease Etienne B on: 11/29/2021 07:10 AM ? ? Modules accepted: Orders ? ?

## 2021-11-29 NOTE — Addendum Note (Signed)
Addended by: Su Monks on: 11/29/2021 09:22 AM ? ? Modules accepted: Orders ? ?

## 2021-12-01 ENCOUNTER — Encounter: Payer: Self-pay | Admitting: Physical Medicine and Rehabilitation

## 2021-12-01 ENCOUNTER — Ambulatory Visit: Payer: Medicare HMO | Admitting: Neurology

## 2021-12-04 ENCOUNTER — Encounter: Payer: Medicare HMO | Admitting: Occupational Therapy

## 2021-12-04 ENCOUNTER — Ambulatory Visit: Payer: Medicare HMO | Admitting: Physical Therapy

## 2021-12-04 DIAGNOSIS — M7989 Other specified soft tissue disorders: Secondary | ICD-10-CM | POA: Diagnosis not present

## 2021-12-06 ENCOUNTER — Encounter: Payer: Self-pay | Admitting: Physical Medicine and Rehabilitation

## 2021-12-06 ENCOUNTER — Ambulatory Visit: Payer: Medicare HMO | Admitting: Speech Pathology

## 2021-12-06 ENCOUNTER — Ambulatory Visit: Payer: Medicare HMO | Admitting: Physical Therapy

## 2021-12-06 ENCOUNTER — Ambulatory Visit: Payer: Medicare HMO | Admitting: Occupational Therapy

## 2021-12-06 DIAGNOSIS — M6281 Muscle weakness (generalized): Secondary | ICD-10-CM

## 2021-12-06 DIAGNOSIS — J301 Allergic rhinitis due to pollen: Secondary | ICD-10-CM | POA: Diagnosis not present

## 2021-12-06 DIAGNOSIS — R41841 Cognitive communication deficit: Secondary | ICD-10-CM

## 2021-12-06 DIAGNOSIS — R41840 Attention and concentration deficit: Secondary | ICD-10-CM | POA: Diagnosis not present

## 2021-12-06 DIAGNOSIS — J3089 Other allergic rhinitis: Secondary | ICD-10-CM | POA: Diagnosis not present

## 2021-12-06 DIAGNOSIS — R278 Other lack of coordination: Secondary | ICD-10-CM | POA: Diagnosis not present

## 2021-12-06 DIAGNOSIS — R2689 Other abnormalities of gait and mobility: Secondary | ICD-10-CM | POA: Diagnosis not present

## 2021-12-06 DIAGNOSIS — Z9181 History of falling: Secondary | ICD-10-CM

## 2021-12-06 DIAGNOSIS — R471 Dysarthria and anarthria: Secondary | ICD-10-CM

## 2021-12-06 DIAGNOSIS — R2681 Unsteadiness on feet: Secondary | ICD-10-CM | POA: Diagnosis not present

## 2021-12-06 DIAGNOSIS — R208 Other disturbances of skin sensation: Secondary | ICD-10-CM | POA: Diagnosis not present

## 2021-12-06 DIAGNOSIS — R4701 Aphasia: Secondary | ICD-10-CM

## 2021-12-06 DIAGNOSIS — J3081 Allergic rhinitis due to animal (cat) (dog) hair and dander: Secondary | ICD-10-CM | POA: Diagnosis not present

## 2021-12-06 DIAGNOSIS — Z483 Aftercare following surgery for neoplasm: Secondary | ICD-10-CM | POA: Diagnosis not present

## 2021-12-06 DIAGNOSIS — R41844 Frontal lobe and executive function deficit: Secondary | ICD-10-CM | POA: Diagnosis not present

## 2021-12-06 MED ORDER — ONDANSETRON HCL 4 MG PO TABS: 4.0000 mg | ORAL_TABLET | Freq: Three times a day (TID) | ORAL | 0 refills | Status: DC | PRN

## 2021-12-06 NOTE — Therapy (Signed)
?OUTPATIENT PHYSICAL THERAPY TREATMENT NOTE ? ? ?Patient Name: Virginia Luna ?MRN: [id number] ?DOB:[birth year], 61 y.o., female ?Today's Date: 12/06/2021 ? ?PCP: Shirline Frees, MD ?REFERRING PROVIDER: Courtney Heys, MD   ? ?END OF SESSION:  ? PT End of Session - 12/06/21 0803   ? ? Visit Number 2   ? Number of Visits 13   12+eval  ? Date for PT Re-Evaluation 02/23/22   ? Authorization Type [employer]IC[employer] HMO   ? PT Start Time 0800   ? PT Stop Time 0844   ? PT Time Calculation (min) 44 min   ? Equipment Utilized During Treatment --   ? Activity Tolerance Patient tolerated treatment well;Patient limited by pain   ? Behavior During Therapy [facility] for tasks assessed/performed   ? ?  ?  ? ?  ? ? ?Past Medical History:  ?Diagnosis Date  ? Allergies   ? Anemia   ? Arthritis   ? Asthma   ? Back pain   ? Chronic pain   ? Complication of anesthesia   ? woke up during colonoscopy  ? Diabetes ([place])   ? Diabetes mellitus without complication ([place])   ? Edema, lower extremity   ? Fibroid   ? Fibromyalgia   ? Chronic  ? GERD (gastroesophageal reflux disease)   ? Headache   ? migraines  ? High blood pressure   ? History of hiatal hernia   ? History of stomach ulcers   ? IBS (irritable bowel syndrome)   ? Joint pain   ? Sleep apnea   ? did use cpap-lost 25lb-says she does not need it   NO CPAP  ? Wears contact lenses   ? ?Past Surgical History:  ?Procedure Laterality Date  ? BRAIN SURGERY Left 11/07/2021  ? [facility] Dr. Herschel [place]  ? BREAST BIOPSY    ? BREAST EXCISIONAL BIOPSY    ? BREAST LUMPECTOMY WITH RADIOACTIVE SEED LOCALIZATION Bilateral 11/24/2014  ? Procedure: BILATERAL BREAST LUMPECTOMY WITH RADIOACTIVE SEED LOCALIZATION;  Surgeon: [name], MD;  Location: [place];  Service: General;  Laterality: Bilateral;  ? CHOLECYSTECTOMY    ? COLONOSCOPY    ? DILATION AND CURETTAGE OF UTERUS    ? ESOPHAGOGASTRODUODENOSCOPY (EGD) WITH PROPOFOL N/A 07/23/2016  ? Procedure: ESOPHAGOGASTRODUODENOSCOPY (EGD) WITH  PROPOFOL;  Surgeon: Laurence Spates, MD;  [facility];  Service: Endoscopy;  Laterality: N/A;  ? ESOPHAGOGASTRODUODENOSCOPY (EGD) WITH PROPOFOL N/A 06/11/2018  ? Procedure: ESOPHAGOGASTRODUODENOSCOPY (EGD) WITH PROPOFOL;  Surgeon: Laurence Spates, MD;  [facility];  Service: Endoscopy;  Laterality: N/A;  ? LUMBAR LAMINECTOMY  2010  ? ORIF WRIST FRACTURE Right 11/24/2020  ? Procedure: OPEN REDUCTION INTERNAL FIXATION RIGHT WRIST FRACTURE;  Surgeon: Renette Butters, MD;  Location: [facility];  Service: Orthopedics;  Laterality: Right;  ? UMBILICAL HERNIA REPAIR    ? age 28  ? UPPER GI ENDOSCOPY    ? ?Patient Active Problem List  ? Diagnosis Date Noted  ? Cerebellar tumor ([place]) 11/15/2021  ? Comorbid sleep-related hypoventilation 10/19/2021  ? Urinary incontinence/areflexic bladder 10/05/2021  ? OSA, unable to tolerate CPAP (vomited every night) 09/05/2021  ? Chronic pain syndrome 09/05/2021  ? Chronic nausea 09/05/2021  ? Neurogenic bladder, followed by Urology, instructed I&O cath BID 09/05/2021  ? Intolerance of continuous positive airway pressure (CPAP) ventilation 09/03/2021  ? Insomnia secondary to chronic pain 07/24/2021  ? Primary osteoarthritis of right knee 07/03/2021  ? Myofascial pain dysfunction syndrome 07/03/2021  ? Fibromyalgia 07/03/2021  ?  Hyponatremia 07/03/2021  ? Primary osteoarthritis of left knee 07/03/2021  ? Body mass index (BMI) 50.0-59.9, adult ([place]) 03/07/2021  ? Allergic rhinitis due to animal (cat) (dog) hair and dander 10/06/2020  ? Allergic rhinitis due to pollen 10/06/2020  ? Moderate persistent asthma, uncomplicated 92/33/0076  ? Cerebellar mass 10/06/2020  ? Paresthesia 05/27/2020  ? Diabetes mellitus ([place]) 05/25/2020  ? Hypertension associated with diabetes ([place]) 05/25/2020  ? Hyperlipidemia associated with type 2 diabetes mellitus ([place]) 05/25/2020  ? Vitamin D deficiency 05/25/2020  ? NAFLD (nonalcoholic fatty liver disease) 05/25/2020  ? Sciatica of left side  05/25/2020  ? ? ?REFERRING DIAG: D49.6 (ICD-10-CM) - Neoplasm of unspecified behavior of brain   ? ?THERAPY DIAG:  ?Muscle weakness (generalized) ? ?Unsteadiness on feet ? ?Other abnormalities of gait and mobility ? ?History of falling ? ?PERTINENT HISTORY: From H&P note on 11/15/2021:  "On the day of admission 11/07/2021, the patient was taken to the operating room by Dr. Recardo Evangelist of neurosurgery and underwent left suboccipital craniotomy, resection of cerebellar tumor. She tolerated the procedure well. Postoperative MRI performed on 3/22 revealed a small area of diffusion restriction in the left cerebellum."  Patient transferred to [place] on 11/15/2021, discharged 11/24/2021 per pt.  fibromyalgia, DM2, asthma, HTN, OSA, migraines, bilateral knee OA, myofascial pain syndrome ?  ? ?PRECAUTIONS: Fall ? ?SUBJECTIVE: Having a lot of pain this morning, pain meds have not kicked in yet. Reports she feels very shaky.  ? ?PAIN:  ?Are you having pain? Yes: NPRS scale: 9/10 ?Pain location: All over (fibromyalgia), worse in head and neck  ? ?OBJECTIVE:  ?  ?DIAGNOSTIC FINDINGS: From Brain MRI on 09/02/2021:  "Increased size of left superior cerebellar vermis mass, now ?measuring 1.5 x 1.5 cm, previously 1.1 x 1.0 cm. Increased mild surrounding edema."  Future brain MRI ordered w/o date visible at this time. ?  ?COGNITION: ?Overall cognitive status: No family/caregiver present to determine baseline cognitive functioning - pt states "I feel stupid", she elaborates that she is having a hard time processing. ?  ?  ?TODAY'S TREATMENT:  ?Ther Act  ? [facility] PT Assessment - 12/06/21 0806   ? ?  ? Balance  ? Balance Assessed Yes   ?  ? Standardized Balance Assessment  ? Standardized Balance Assessment Berg Balance Test;Timed Up and Go Test   ?  ? Berg Balance Test  ? Sit to Stand Able to stand without using hands and stabilize independently   ? Standing Unsupported Able to stand 2 minutes with supervision   ? Sitting with Back Unsupported  but Feet Supported on Floor or Stool Able to sit safely and securely 2 minutes   ? Stand to Sit Sits safely with minimal use of hands   ? Transfers Able to transfer safely, minor use of hands   ? Standing Unsupported with Eyes Closed Able to stand 10 seconds with supervision   ? Standing Unsupported with Feet Together Able to place feet together independently and stand for 1 minute with supervision   ? From Standing, Reach Forward with Outstretched Arm Reaches forward but needs supervision   Pt reached down rather than forward, required CGA  ? From Standing Position, Pick up Object from Floor Unable to try/needs assist to keep balance   Braced hand against mat  ? From Standing Position, Turn to Look Behind Over each Shoulder Looks behind from both sides and weight shifts well   ? Turn 360 Degrees Needs close supervision or verbal cueing   ?  Standing Unsupported, Alternately Place Feet on Step/Stool Able to complete >2 steps/needs minimal assist   Required CGA  ? Standing Unsupported, One Foot in Front Able to take small step independently and hold 30 seconds   ? Standing on One Leg Tries to lift leg/unable to hold 3 seconds but remains standing independently   ? Total Score 35   ? Berg comment: High fall risk   ?  ? Timed Up and Go Test  ? Normal TUG (seconds) 34.87   w/RW and S*  ? ?  ?  ? ?  ? ?Ther Ex ?Established and demonstrated initial HEP (see below) for BLE strength, single leg stability and improved cervical ROM (per pt request). Pt able to demonstrate each movement well and can teach back safe set up at home.  ? ?  ?PATIENT EDUCATION: ?Education details: Berg and TUG results, goal updates, initial HEP ?Person educated: Patient ?Education method: Explanation and handout ?Education comprehension: verbalized understanding and needs further education ?  ?  ?HOME EXERCISE PROGRAM: ?Access Code: [email or web address] ?URL: [email or web address] ?Date: 12/06/2021 ?Prepared by: Mickie Bail Shaiden Aldous ? ?Exercises ?- Sit  to Stand Without Arm Support  - 1 x daily - 7 x weekly - 3 sets - 10 reps ?- Standing March with Counter Support  - 1 x daily - 7 x weekly - 3 sets - 10 reps ?- Chin Tuck  - 1 x daily - 7 x weekly - 3 sets - 10

## 2021-12-06 NOTE — Patient Instructions (Signed)
Medications: ?  ?This week you try doing everything with medications with god-daughter supervision, correcting if needed  ? ?Keep taking your meds in conjunction with meals -- this is a great routine and perfect way to ensure you do not forget a dose  ? ?Schedule: ? ?Recommend deciding which system - paper or computer - works best and consolidating your schedule onto that system ? ?This makes less work for you, makes it easy to see what you have going on  ? ? ?

## 2021-12-06 NOTE — Patient Instructions (Signed)
1. Grip Strengthening (Resistive Putty) ? ? ?Squeeze putty using thumb and all fingers. ?Repeat _20___ times. Do __2__ sessions per day. ? ? ?2. Roll putty into tube on table and pinch between first two fingers and thumb x 10 reps. Do 2 sessions per day. Prevent hyperextension of tip joints - keep a slight bend in tip joints ? ? ? ?Coordination Activities ? ?Perform the following activities for 15-20 minutes 2 times per day with both hand(s). ? ?Rotate ball in fingertips (clockwise and counter-clockwise). ?Toss ball in air and catch with the same hand. ?Flip cards 1 at a time as fast as you can. ?Deal cards with your thumb (Hold deck in hand and push card off top with thumb). ?Rotate one card in hand (clockwise and counter-clockwise). ?Pick up coins, buttons, marbles, dried beans/pasta of different sizes and place in container. ?Pick up coins and stack. ?Practice writing  ? ?SHOULDER: Flexion - Supine (Cane) ? ? ? ?Hold paper towel roll in both hands, palms facing. Raise arms up overhead. Do not allow back to arch. Hold _3__ seconds. _10__ reps per set, _2__ sets per day, ?

## 2021-12-06 NOTE — Therapy (Signed)
?OUTPATIENT OCCUPATIONAL THERAPY TREATMENT NOTE ? ? ?Patient Name: Virginia Luna ?MRN: [id number] ?DOB:[birth year], 61 y.o., female ?Today's Date: 12/06/2021 ? ?PCP: Shirline Frees, MD ?REFERRING PROVIDER: Dr.Lovorn/ Risa Grill PA-C  ? ?END OF SESSION:  ? OT End of Session - 12/06/21 0942   ? ? Visit Number 2   ? Number of Visits 25   ? Date for OT Re-Evaluation 02/20/22   ? Authorization Type [employer]   ? Authorization - Visit Number 2   ? Authorization - Number of Visits 10   ? Progress Note Due on Visit 10   ? OT Start Time 0845   ? OT Stop Time 0930   ? OT Time Calculation (min) 45 min   ? Activity Tolerance Patient tolerated treatment well;Other (comment)   ? Behavior During Therapy [facility] for tasks assessed/performed   became tearful during session - benefits from encouragement  ? ?  ?  ? ?  ? ? ?Past Medical History:  ?Diagnosis Date  ? Allergies   ? Anemia   ? Arthritis   ? Asthma   ? Back pain   ? Chronic pain   ? Complication of anesthesia   ? woke up during colonoscopy  ? Diabetes ([place])   ? Diabetes mellitus without complication ([place])   ? Edema, lower extremity   ? Fibroid   ? Fibromyalgia   ? Chronic  ? GERD (gastroesophageal reflux disease)   ? Headache   ? migraines  ? High blood pressure   ? History of hiatal hernia   ? History of stomach ulcers   ? IBS (irritable bowel syndrome)   ? Joint pain   ? Sleep apnea   ? did use cpap-lost 25lb-says she does not need it   NO CPAP  ? Wears contact lenses   ? ?Past Surgical History:  ?Procedure Laterality Date  ? BRAIN SURGERY Left 11/07/2021  ? [facility] Dr. Herschel [place]  ? BREAST BIOPSY    ? BREAST EXCISIONAL BIOPSY    ? BREAST LUMPECTOMY WITH RADIOACTIVE SEED LOCALIZATION Bilateral 11/24/2014  ? Procedure: BILATERAL BREAST LUMPECTOMY WITH RADIOACTIVE SEED LOCALIZATION;  Surgeon: [name], MD;  Location: [place];  Service: General;  Laterality: Bilateral;  ? CHOLECYSTECTOMY    ? COLONOSCOPY    ? DILATION AND CURETTAGE OF UTERUS     ? ESOPHAGOGASTRODUODENOSCOPY (EGD) WITH PROPOFOL N/A 07/23/2016  ? Procedure: ESOPHAGOGASTRODUODENOSCOPY (EGD) WITH PROPOFOL;  Surgeon: Laurence Spates, MD;  [facility];  Service: Endoscopy;  Laterality: N/A;  ? ESOPHAGOGASTRODUODENOSCOPY (EGD) WITH PROPOFOL N/A 06/11/2018  ? Procedure: ESOPHAGOGASTRODUODENOSCOPY (EGD) WITH PROPOFOL;  Surgeon: Laurence Spates, MD;  [facility];  Service: Endoscopy;  Laterality: N/A;  ? LUMBAR LAMINECTOMY  2010  ? ORIF WRIST FRACTURE Right 11/24/2020  ? Procedure: OPEN REDUCTION INTERNAL FIXATION RIGHT WRIST FRACTURE;  Surgeon: Renette Butters, MD;  [facility];  Service: Orthopedics;  Laterality: Right;  ? UMBILICAL HERNIA REPAIR    ? age 66  ? UPPER GI ENDOSCOPY    ? ?Patient Active Problem List  ? Diagnosis Date Noted  ? Cerebellar tumor ([place]) 11/15/2021  ? Comorbid sleep-related hypoventilation 10/19/2021  ? Urinary incontinence/areflexic bladder 10/05/2021  ? OSA, unable to tolerate CPAP (vomited every night) 09/05/2021  ? Chronic pain syndrome 09/05/2021  ? Chronic nausea 09/05/2021  ? Neurogenic bladder, followed by Urology, instructed I&O cath BID 09/05/2021  ? Intolerance of continuous positive airway pressure (CPAP) ventilation 09/03/2021  ? Insomnia secondary to chronic pain  07/24/2021  ? Primary osteoarthritis of right knee 07/03/2021  ? Myofascial pain dysfunction syndrome 07/03/2021  ? Fibromyalgia 07/03/2021  ? Hyponatremia 07/03/2021  ? Primary osteoarthritis of left knee 07/03/2021  ? Body mass index (BMI) 50.0-59.9, adult ([place]) 03/07/2021  ? Allergic rhinitis due to animal (cat) (dog) hair and dander 10/06/2020  ? Allergic rhinitis due to pollen 10/06/2020  ? Moderate persistent asthma, uncomplicated 64/40/3474  ? Cerebellar mass 10/06/2020  ? Paresthesia 05/27/2020  ? Diabetes mellitus ([place]) 05/25/2020  ? Hypertension associated with diabetes ([place]) 05/25/2020  ? Hyperlipidemia associated with type 2 diabetes mellitus ([place]) 05/25/2020  ?  Vitamin D deficiency 05/25/2020  ? NAFLD (nonalcoholic fatty liver disease) 05/25/2020  ? Sciatica of left side 05/25/2020  ? ? ?ONSET DATE: 11/07/21  ? ?REFERRING DIAG: cerebellar tumor, s/p craniotomy  ? ?THERAPY DIAG:  ?Muscle weakness (generalized) ? ?Other lack of coordination ? ? ?PERTINENT HISTORY: Virginia Luna is a 61 year old female who recently presented to hospitalized at [facility] complaining of headaches and tremors. 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 She was admitted 11/07/2021, underwent left suboccipital craniotomy, resection of cerebellar tumor. Postoperative MRI performed on 3/22 revealed a small area of diffusion restriction in the left cerebellum Pt was transferred to  [facility] 11/15/21 and  she was d/c home 11/24/21.PMH: arthritis, asthma, chronic pain, DM, fibromyalgia, myofascial pain syndrome, HTN, IBS, sleep apnea Brain ?  ?PRECAUTIONS: Fall, no driving ? ?SUBJECTIVE: I have pain in Lt leg as well but r/o DVT (O.T. not directly addressing) ? ?PAIN:  ?Are you having pain? Yes: NPRS scale: 9/10 ?Pain location: head and neck ?Pain description: constant ?Aggravating factors: surgery, fibromyalgia, myofascial pain syndrome ?Relieving factors: meds ? ? ? ?OBJECTIVE:  ? ?TODAY'S TREATMENT:  ? ?See pt instructions in Epic and below education.  ?Pt issued red resistance putty for Rt hand, and yellow resistance putty for Lt hand ? ? ? ?PATIENT EDUCATION: ?Education details: putty HEP, coordination HEP, sh flex supine ?Person educated: Patient ?Education method: Explanation, Demonstration, Verbal cues, and Handouts ?Education comprehension: verbalized understanding, returned demonstration, verbal cues required, and needs further education ? ? ?GOALS: ?  ?  ?SHORT TERM GOALS: Target date: 12/26/2021 ?  ?I with HEP ?  ?Goal status: ONGOING (issued 12/06/21) ?  ?2.  Pt will perform dressing mod I in a reasonable amount of  time ?Baseline: supervision, increased time required ?Goal status: INITIAL ?  ?3.  Pt will perform basic home management with min A. ?Baseline: dependent ?Goal status: INITIAL ?  ?4.  Pt will perform simple cooking task with min a demonstrating good safety awareness. ?Baseline: dependent ?Goal status: INITIAL ?  ?5.  Pt will increase bilateral grip strength by 5 lbs for increased ease opening containers. ?Baseline RUE 21.1 lbs, LUE 14.9 lbs :  ?Goal status: INITIAL ?  ?6.  Pt will demonstrate ability to stand for functional/ ADL tasks x 15 mins without rest break and no LOB. ?Baseline: poor and inconsistent endurance ?Goal status: INITIAL ?  ?LONG TERM GOALS: Target date: 02/20/2022 ?  ?I with updated HEP. ?  ?Goal status: INITIAL ?  ?2.  Pt will perform all basic ADLS mod I ?Baseline: supervision ?Goal status: INITIAL ?  ?3.  Pt will perform basic home management modified I ?Baseline: dependent ?Goal status: INITIAL ?  ?4.  Pt will perform basic cooking mod I demonstrating good safety awareness. ?Baseline: depdnent ?Goal status: INITIAL ?  ?  5.  Pt will demonstrate improved fine motor coordination for ADLS as evidenced by decreasing 9 hole peg test score by 3 secs, bilaterally. ?Baseline: R 35.06, L 38.53 ?Goal status: INITIAL ?  ?6.  Pt will demonstrate ability to retrieve a 3 lbs weight from overhead shelf with left and right UE's individually demonstrating good control. ?Baseline: decreased bilateral strength and control. ?Goal status: INITIAL ?  ?ASSESSMENT: ?  ?CLINICAL IMPRESSION: Pt progressing towards STG # 1. Pt became tearful during session - benefits from encouragement. Ataxia present especially w/ open chain activities (LUE > RUE) ? ?  ?PERFORMANCE DEFICITS in functional skills including ADLs, IADLs, coordination, dexterity, sensation, ROM, strength, pain, fascial restrictions, flexibility, FMC, GMC, mobility, balance, endurance, decreased knowledge of precautions, decreased knowledge of use of DME,  vision, and UE functional use, cognitive skills including attention, energy/drive, learn, memory, problem solving, safety awareness, temperament/personality, thought, and understand, and psychosocial skills i

## 2021-12-06 NOTE — Therapy (Signed)
?OUTPATIENT SPEECH LANGUAGE PATHOLOGY TREATMENT NOTE ? ? ?Patient Name: Virginia Luna ?MRN: [id number] ?DOB:11/15/1960, 61 y.o., female ?Today's Date: 12/06/2021 ? ?PCP: Shirline Frees, MD ?REFERRING PROVIDER: Barbie Banner, PA-C  ? ?END OF SESSION:  ? End of Session - 12/06/21 0859   ? ? Visit Number 2   ? Number of Visits 25   ? Date for SLP Re-Evaluation 02/20/22   ? Authorization Type [employer]   ? Progress Note Due on Visit 10   ? SLP Start Time 0930   ? SLP Stop Time  1015   ? SLP Time Calculation (min) 45 min   ? Activity Tolerance Patient limited by fatigue;Patient tolerated treatment well   ? ?  ?  ? ?  ? ? ?Past Medical History:  ?Diagnosis Date  ? Allergies   ? Anemia   ? Arthritis   ? Asthma   ? Back pain   ? Chronic pain   ? Complication of anesthesia   ? woke up during colonoscopy  ? Diabetes ([place])   ? Diabetes mellitus without complication ([place])   ? Edema, lower extremity   ? Fibroid   ? Fibromyalgia   ? Chronic  ? GERD (gastroesophageal reflux disease)   ? Headache   ? migraines  ? High blood pressure   ? History of hiatal hernia   ? History of stomach ulcers   ? IBS (irritable bowel syndrome)   ? Joint pain   ? Sleep apnea   ? did use cpap-lost 25lb-says she does not need it   NO CPAP  ? Wears contact lenses   ? ?Past Surgical History:  ?Procedure Laterality Date  ? BRAIN SURGERY Left 11/07/2021  ? [facility] Dr. Herschel [place]  ? BREAST BIOPSY    ? BREAST EXCISIONAL BIOPSY    ? BREAST LUMPECTOMY WITH RADIOACTIVE SEED LOCALIZATION Bilateral 11/24/2014  ? Procedure: BILATERAL BREAST LUMPECTOMY WITH RADIOACTIVE SEED LOCALIZATION;  Surgeon: [name], MD;  Location: [place];  Service: General;  Laterality: Bilateral;  ? CHOLECYSTECTOMY    ? COLONOSCOPY    ? DILATION AND CURETTAGE OF UTERUS    ? ESOPHAGOGASTRODUODENOSCOPY (EGD) WITH PROPOFOL N/A 07/23/2016  ? Procedure: ESOPHAGOGASTRODUODENOSCOPY (EGD) WITH PROPOFOL;  Surgeon: Laurence Spates, MD;  [facility];   Service: Endoscopy;  Laterality: N/A;  ? ESOPHAGOGASTRODUODENOSCOPY (EGD) WITH PROPOFOL N/A 06/11/2018  ? Procedure: ESOPHAGOGASTRODUODENOSCOPY (EGD) WITH PROPOFOL;  Surgeon: Laurence Spates, MD;  Location: [facility];  Service: Endoscopy;  Laterality: N/A;  ? LUMBAR LAMINECTOMY  2010  ? ORIF WRIST FRACTURE Right 11/24/2020  ? Procedure: OPEN REDUCTION INTERNAL FIXATION RIGHT WRIST FRACTURE;  Surgeon: Renette Butters, MD;  Location: [facility];  Service: Orthopedics;  Laterality: Right;  ? UMBILICAL HERNIA REPAIR    ? age 61  ? UPPER GI ENDOSCOPY    ? ?Patient Active Problem List  ? Diagnosis Date Noted  ? Cerebellar tumor ([place]) 11/15/2021  ? Comorbid sleep-related hypoventilation 10/19/2021  ? Urinary incontinence/areflexic bladder 10/05/2021  ? OSA, unable to tolerate CPAP (vomited every night) 09/05/2021  ? Chronic pain syndrome 09/05/2021  ? Chronic nausea 09/05/2021  ? Neurogenic bladder, followed by Urology, instructed I&O cath BID 09/05/2021  ? Intolerance of continuous positive airway pressure (CPAP) ventilation 09/03/2021  ? Insomnia secondary to chronic pain 07/24/2021  ? Primary osteoarthritis of right knee 07/03/2021  ? Myofascial pain dysfunction syndrome 07/03/2021  ? Fibromyalgia 07/03/2021  ? Hyponatremia 07/03/2021  ? Primary osteoarthritis of left knee 07/03/2021  ?  Body mass index (BMI) 50.0-59.9, adult ([place]) 03/07/2021  ? Allergic rhinitis due to animal (cat) (dog) hair and dander 10/06/2020  ? Allergic rhinitis due to pollen 10/06/2020  ? Moderate persistent asthma, uncomplicated 00/86/7619  ? Cerebellar mass 10/06/2020  ? Paresthesia 05/27/2020  ? Diabetes mellitus ([place]) 05/25/2020  ? Hypertension associated with diabetes ([place]) 05/25/2020  ? Hyperlipidemia associated with type 2 diabetes mellitus ([place]) 05/25/2020  ? Vitamin D deficiency 05/25/2020  ? NAFLD (nonalcoholic fatty liver disease) 05/25/2020  ? Sciatica of left side 05/25/2020  ? ? ?ONSET DATE: March 2023 (surgery  11/07/2021) ? ?REFERRING DIAG: D49.6 (ICD-10-CM) - Cerebellar tumor ([place])  ? ?THERAPY DIAG:  ?Aphasia ? ?Cognitive communication deficit ? ?Dysarthria and anarthria ? ?SUBJECTIVE: "not good" ? ?PAIN:  ?Are you having pain? Yes ?NPRS scale: 9/10 ?Pain location: head and neck ?Pain orientation: Posterior  ? ?OBJECTIVE:  ? ?TODAY'S TREATMENT: ?SKILLED INTERVENTIONS:  ?12-06-21: Pt reports to using computer and paper planner to manage appointments. Discussed reasoning why pt is using both and encouraged her to continue to promote IND in orientation and managing schedule. ST provides education on establishing routine for taking meds, encouraged pt to think about best time to take. Pt reports to planning to move home this week. Encouraged pt to try managing meds IND this week with god-daughter supervising to ensure she is prepared to do it w/o her assistance once she moves. Initiated CLQT this date. Pt demonstrates poor frustration tolerance throughout tasks, becoming easily discouraged. ST provides reassurance of nature of assessment just to gain information. Pt scores moderate for memory and mild for language. Other domains not yet complete. ? ?PATIENT EDUCATION: ?Education details: see above ?Person educated: Patient ?Education method: Explanation, Demonstration, and Handouts ?Education comprehension: verbalized understanding, returned demonstration, and needs further education ?  ?  ?GOALS: ?Goals reviewed with patient? Yes ?  ?SHORT TERM GOALS: Target date: 12/27/2021 ?  ?Pt will complete standardized cognitive assessment to better appreciate reported cognitive deficits within first 2 sessions.  ?Baseline: 12-06-21 ?Goal status: INITIAL ?  ?2.  Pt will verbalize and implement 2 attention and memory compensations to aid daily functioning given occasional min A over 2 sessions ?Baseline:  ?Goal status: INITIAL ?  ?3.  Pt will demonstrate successful usage of anomia compensations, with occasional min-A, to make 5 minute mod  complex conversation functional over 2 sessions ?Baseline:  ?Goal status: INITIAL ?  ?4.  Pt will demonstrate dysarthria compensations on structured speech tasks with 80% accuracy given occasional min A over 2 sessions ?Baseline:  ?Goal status: INITIAL ?  ?  ?LONG TERM GOALS: Target date: 02/21/2022 ?  ?Pt will verbalize x3 functional areas in which implementation of attention/memory compensations/strategies have resulted in increased successful participation in desired activities.  ?Baseline:  ?Goal status: INITIAL ?  ?2.  Pt will report completion of dysarthria HEP at least 1x/day (rec BID) over 1 week period with rare min-A.  ?Baseline:  ?Goal status: INITIAL ?  ?3.  Pt will report improvement on CPIB by 3 points by d/c.  ?Baseline:  ?Goal status: INITIAL ?  ?4.  Pt will utilize anomia compensations when word finding occurs in 20+ minute conversation with rare min A ?Baseline:  ?Goal status: INITIAL ?  ?  ?ASSESSMENT: ?  ?CLINICAL IMPRESSION: ?Patient is a 61 y.o. F who was seen today for cognitive communication deficits related to cognition, speech, and language s/p cerebellar tumor resection and CVA. Pt demonstrates significantly impacted processing, requiring extended time to process visual  and auditory information and form appropriate responses in conversation. Auditory processing impairments observed in y/n questions, pt with high accuracy, however requires repetition from ST and to self + extended period to generate response. Anomia demonstrated and frequent use of vague language without awareness. W/o cueing, pt not aware that ST lacks insight into vague references and requires min-A to repair communication breakdowns. Demonstrates memory impairments in simple tasks such as stating months of year, has to repeat to self to come up with "October." Currently residing with God-daughter d/t limited ability to care for self. Currently requires assistance with medication management, managing finances, feeding self,  managing schedule. Dysphagia impairments noted from CIR notes, pt reports to using compensations and is able to teach back those to ST. Pain and fatigue likely impacting pt's overall cognitive function, based on pt report. ST

## 2021-12-08 ENCOUNTER — Ambulatory Visit: Payer: Medicare HMO | Admitting: Neurology

## 2021-12-08 DIAGNOSIS — G43709 Chronic migraine without aura, not intractable, without status migrainosus: Secondary | ICD-10-CM

## 2021-12-08 MED ORDER — ONABOTULINUMTOXINA 100 UNITS IJ SOLR
185.0000 [IU] | Freq: Once | INTRAMUSCULAR | Status: AC
  Administered 2021-12-08: 155 [IU] via INTRAMUSCULAR

## 2021-12-08 NOTE — Progress Notes (Signed)
[facility]  ? ?Procedure Note Botox ? ?Attending: Dr. Metta Clines ? ?Preoperative Diagnosis(es): Chronic migraine ? ?Consent obtained from: The patient ?Benefits discussed included, but were not limited to decreased muscle tightness, increased joint range of motion, and decreased pain.  Risk discussed included, but were not limited pain and discomfort, bleeding, bruising, excessive weakness, venous thrombosis, muscle atrophy and dysphagia.  Anticipated outcomes of the procedure as well as he risks and benefits of the alternatives to the procedure, and the roles and tasks of the personnel to be involved, were discussed with the patient, and the patient consents to the procedure and agrees to proceed. A copy of the patient medication guide was given to the patient which explains the blackbox warning. ? ?Patients identity and treatment sites confirmed Yes.  . ? ?Details of Procedure: ?Skin was cleaned with alcohol. Prior to injection, the needle plunger was aspirated to make sure the needle was not within a blood vessel.  There was no blood retrieved on aspiration.   ? ?Following is a summary of the muscles injected  And the amount of Botulinum toxin used: ? ?Dilution ?200 units of Botox was reconstituted with 4 ml of preservative free normal saline. ?Time of reconstitution: At the time of the office visit (<30 minutes prior to injection)  ? ?Injections  ?155 total units of Botox was injected with a 30 gauge needle. ? ?Injection Sites: ?L occipitalis: 15 units- 3 sites  ?R occiptalis: 15 units- 3 sites ? ?L upper trapezius: 15 units- 3 sites ?R upper trapezius: 15 units- 3 sits          ?L paraspinal: 10 units- 2 sites ?R paraspinal: 10 units- 2 sites ? ?Face ?L frontalis(2 injection sites):10 units   ?R frontalis(2 injection sites):10 units         ?L corrugator: 5 units   ?R corrugator: 5 units           ?Procerus: 5 units   ?L temporalis: 20 units ?R temporalis: 20 units  ? ?Agent:  ?200 units of botulinum Type  A (Onobotulinum Toxin type A) was reconstituted with 4 ml of preservative free normal saline.  ?Time of reconstitution: At the time of the office visit (<30 minutes prior to injection)  ? ? ? Total injected (Units):  155 ? Total wasted (Units): 30 ? ?Patient tolerated procedure well without complications.   ?Reinjection is anticipated in 3 months. ? ? ?

## 2021-12-12 ENCOUNTER — Ambulatory Visit: Payer: Medicare HMO | Admitting: Physical Therapy

## 2021-12-12 ENCOUNTER — Encounter: Payer: Self-pay | Admitting: Occupational Therapy

## 2021-12-12 ENCOUNTER — Ambulatory Visit: Payer: Medicare HMO | Admitting: Speech Pathology

## 2021-12-12 ENCOUNTER — Ambulatory Visit: Payer: Medicare HMO | Admitting: Occupational Therapy

## 2021-12-12 ENCOUNTER — Encounter: Payer: Self-pay | Admitting: Physical Therapy

## 2021-12-12 DIAGNOSIS — R2689 Other abnormalities of gait and mobility: Secondary | ICD-10-CM | POA: Diagnosis not present

## 2021-12-12 DIAGNOSIS — R41844 Frontal lobe and executive function deficit: Secondary | ICD-10-CM

## 2021-12-12 DIAGNOSIS — K219 Gastro-esophageal reflux disease without esophagitis: Secondary | ICD-10-CM | POA: Diagnosis not present

## 2021-12-12 DIAGNOSIS — I10 Essential (primary) hypertension: Secondary | ICD-10-CM | POA: Diagnosis not present

## 2021-12-12 DIAGNOSIS — E119 Type 2 diabetes mellitus without complications: Secondary | ICD-10-CM | POA: Diagnosis not present

## 2021-12-12 DIAGNOSIS — R41840 Attention and concentration deficit: Secondary | ICD-10-CM | POA: Diagnosis not present

## 2021-12-12 DIAGNOSIS — R41841 Cognitive communication deficit: Secondary | ICD-10-CM

## 2021-12-12 DIAGNOSIS — R2681 Unsteadiness on feet: Secondary | ICD-10-CM

## 2021-12-12 DIAGNOSIS — R278 Other lack of coordination: Secondary | ICD-10-CM

## 2021-12-12 DIAGNOSIS — Z483 Aftercare following surgery for neoplasm: Secondary | ICD-10-CM | POA: Diagnosis not present

## 2021-12-12 DIAGNOSIS — M6281 Muscle weakness (generalized): Secondary | ICD-10-CM

## 2021-12-12 DIAGNOSIS — R208 Other disturbances of skin sensation: Secondary | ICD-10-CM | POA: Diagnosis not present

## 2021-12-12 DIAGNOSIS — J3089 Other allergic rhinitis: Secondary | ICD-10-CM | POA: Diagnosis not present

## 2021-12-12 DIAGNOSIS — J3081 Allergic rhinitis due to animal (cat) (dog) hair and dander: Secondary | ICD-10-CM | POA: Diagnosis not present

## 2021-12-12 DIAGNOSIS — J301 Allergic rhinitis due to pollen: Secondary | ICD-10-CM | POA: Diagnosis not present

## 2021-12-12 DIAGNOSIS — E7800 Pure hypercholesterolemia, unspecified: Secondary | ICD-10-CM | POA: Diagnosis not present

## 2021-12-12 NOTE — Patient Instructions (Addendum)
The Brain that Changes Itself by Kem Boroughs ? ?A book about neuroplasticity... can also google "neuroplasticity" to learn more about how your brain can adapt in face of adversity ? ?Think about things you have difficult time remembering but want to be able to. Next session we can generate strategies to address those.  ?

## 2021-12-12 NOTE — Therapy (Signed)
?OUTPATIENT OCCUPATIONAL THERAPY TREATMENT NOTE ? ? ?Patient Name: Virginia Luna ?MRN: [id number] ?DOB:03/27/1961, 61 y.o., female ?Today's Date: 12/12/2021 ? ?PCP: Shirline Frees, MD ?REFERRING PROVIDER: Dr.Lovorn/ Risa Grill PA-C  ? ?END OF SESSION:  ? OT End of Session - 12/12/21 1012   ? ? Visit Number 3   ? Number of Visits 25   ? Date for OT Re-Evaluation 02/20/22   ? Authorization Type [employer]   ? Authorization - Visit Number 3   ? Authorization - Number of Visits 10   ? Progress Note Due on Visit 10   ? OT Start Time 0930   ? OT Stop Time 1010   ? OT Time Calculation (min) 40 min   ? Activity Tolerance Patient limited by pain   ? Behavior During Therapy Flat affect   ? ?  ?  ? ?  ? ? ?Past Medical History:  ?Diagnosis Date  ? Allergies   ? Anemia   ? Arthritis   ? Asthma   ? Back pain   ? Chronic pain   ? Complication of anesthesia   ? woke up during colonoscopy  ? Diabetes ([place])   ? Diabetes mellitus without complication ([place])   ? Edema, lower extremity   ? Fibroid   ? Fibromyalgia   ? Chronic  ? GERD (gastroesophageal reflux disease)   ? Headache   ? migraines  ? High blood pressure   ? History of hiatal hernia   ? History of stomach ulcers   ? IBS (irritable bowel syndrome)   ? Joint pain   ? Sleep apnea   ? did use cpap-lost 25lb-says she does not need it   NO CPAP  ? Wears contact lenses   ? ?Past Surgical History:  ?Procedure Laterality Date  ? BRAIN SURGERY Left 11/07/2021  ? [facility] Dr[facility] [place]  ? BREAST BIOPSY    ? BREAST EXCISIONAL BIOPSY    ? BREAST LUMPECTOMY WITH RADIOACTIVE SEED LOCALIZATION Bilateral 11/24/2014  ? Procedure: BILATERAL BREAST LUMPECTOMY WITH RADIOACTIVE SEED LOCALIZATION;  Surgeon: [name], MD;  Location: [place];  Service: General;  Laterality: Bilateral;  ? CHOLECYSTECTOMY    ? COLONOSCOPY    ? DILATION AND CURETTAGE OF UTERUS    ? ESOPHAGOGASTRODUODENOSCOPY (EGD) WITH PROPOFOL N/A 07/23/2016  ? Procedure: ESOPHAGOGASTRODUODENOSCOPY  (EGD) WITH PROPOFOL;  Surgeon: Laurence Spates, MD;  [facility];  Service: Endoscopy;  Laterality: N/A;  ? ESOPHAGOGASTRODUODENOSCOPY (EGD) WITH PROPOFOL N/A 06/11/2018  ? Procedure: ESOPHAGOGASTRODUODENOSCOPY (EGD) WITH PROPOFOL;  Surgeon: Laurence Spates, MD;  [facility];  Service: Endoscopy;  Laterality: N/A;  ? LUMBAR LAMINECTOMY  2010  ? ORIF WRIST FRACTURE Right 11/24/2020  ? Procedure: OPEN REDUCTION INTERNAL FIXATION RIGHT WRIST FRACTURE;  Surgeon: Renette Butters, MD;  [facility];  Service: Orthopedics;  Laterality: Right;  ? UMBILICAL HERNIA REPAIR    ? age 35  ? UPPER GI ENDOSCOPY    ? ?Patient Active Problem List  ? Diagnosis Date Noted  ? Cerebellar tumor ([place]) 11/15/2021  ? Comorbid sleep-related hypoventilation 10/19/2021  ? Urinary incontinence/areflexic bladder 10/05/2021  ? OSA, unable to tolerate CPAP (vomited every night) 09/05/2021  ? Chronic pain syndrome 09/05/2021  ? Chronic nausea 09/05/2021  ? Neurogenic bladder, followed by Urology, instructed I&O cath BID 09/05/2021  ? Intolerance of continuous positive airway pressure (CPAP) ventilation 09/03/2021  ? Insomnia secondary to chronic pain 07/24/2021  ? Primary osteoarthritis of right knee 07/03/2021  ? Myofascial  pain dysfunction syndrome 07/03/2021  ? Fibromyalgia 07/03/2021  ? Hyponatremia 07/03/2021  ? Primary osteoarthritis of left knee 07/03/2021  ? Body mass index (BMI) 50.0-59.9, adult ([place]) 03/07/2021  ? Allergic rhinitis due to animal (cat) (dog) hair and dander 10/06/2020  ? Allergic rhinitis due to pollen 10/06/2020  ? Moderate persistent asthma, uncomplicated 13/24/4010  ? Cerebellar mass 10/06/2020  ? Paresthesia 05/27/2020  ? Diabetes mellitus ([place]) 05/25/2020  ? Hypertension associated with diabetes ([place]) 05/25/2020  ? Hyperlipidemia associated with type 2 diabetes mellitus ([place]) 05/25/2020  ? Vitamin D deficiency 05/25/2020  ? NAFLD (nonalcoholic fatty liver disease) 05/25/2020  ? Sciatica of left  side 05/25/2020  ? ? ?ONSET DATE: 11/07/21  ? ?REFERRING DIAG: cerebellar tumor, s/p craniotomy  ? ?THERAPY DIAG:  ?Muscle weakness (generalized) ? ?Unsteadiness on feet ? ?Other lack of coordination ? ?Frontal lobe and executive function deficit ? ?Attention and concentration deficit ? ?Other disturbances of skin sensation ? ? ?PERTINENT HISTORY: Virginia Luna is a 61 year old female who recently presented to hospitalized at [place] in [facility] complaining of headaches and tremors. 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 She was admitted 11/07/2021, underwent left suboccipital craniotomy, resection of cerebellar tumor. Postoperative MRI performed on 3/22 revealed a small area of diffusion restriction in the left cerebellum Pt was transferred to  [facility] 11/15/21 and  she was d/c home 11/24/21.PMH: arthritis, asthma, chronic pain, DM, fibromyalgia, myofascial pain syndrome, HTN, IBS, sleep apnea Brain ?  ?PRECAUTIONS: Fall, no driving ? ?SUBJECTIVE: I have pain in Lt leg as well but r/o DVT (O.T. not directly addressing) ? ?PAIN:  ?Are you having pain? Yes: NPRS scale: 8/10 ?Pain location: head and neck ?Pain description: constant ?Aggravating factors: surgery, fibromyalgia, myofascial pain syndrome ?Relieving factors: meds ? ? ? ?OBJECTIVE:  ? ?TODAY'S TREATMENT: Patient received from Speech treatment today.  Patient with significant pain in head and body.  Worked to find movements which did not exacerbate symptoms - did best with small ranges of modified closed chain activity and body on arm motion.  ? ?See pt instructions in Epic and below education.  ?Pt issued red resistance putty for Rt hand, and yellow resistance putty for Lt hand ? ? ? ?PATIENT EDUCATION: ?Education details: putty HEP, coordination HEP, sh flex supine ?Person educated: Patient ?Education method: Explanation, Demonstration, Verbal cues, and Handouts ?Education  comprehension: verbalized understanding, returned demonstration, verbal cues required, and needs further education ? ? ?GOALS: ?  ?  ?SHORT TERM GOALS: Target date: 12/26/2021 ?  ?I with HEP ?  ?Goal status: ONGOING (issued 12/06/21) ?  ?2.  Pt will perform dressing mod I in a reasonable amount of time ?Baseline: supervision, increased time required ?Goal status: INITIAL ?  ?3.  Pt will perform basic home management with min A. ?Baseline: dependent ?Goal status: INITIAL ?  ?4.  Pt will perform simple cooking task with min a demonstrating good safety awareness. ?Baseline: dependent ?Goal status: INITIAL ?  ?5.  Pt will increase bilateral grip strength by 5 lbs for increased ease opening containers. ?Baseline RUE 21.1 lbs, LUE 14.9 lbs :  ?Goal status: INITIAL ?  ?6.  Pt will demonstrate ability to stand for functional/ ADL tasks x 15 mins without rest break and no LOB. ?Baseline: poor and inconsistent endurance ?Goal status: INITIAL ?  ?LONG TERM GOALS: Target date: 02/20/2022 ?  ?I with updated HEP. ?  ?Goal status: INITIAL ?  ?2.  Pt  will perform all basic ADLS mod I ?Baseline: supervision ?Goal status: INITIAL ?  ?3.  Pt will perform basic home management modified I ?Baseline: dependent ?Goal status: INITIAL ?  ?4.  Pt will perform basic cooking mod I demonstrating good safety awareness. ?Baseline: depdnent ?Goal status: INITIAL ?  ?5.  Pt will demonstrate improved fine motor coordination for ADLS as evidenced by decreasing 9 hole peg test score by 3 secs, bilaterally. ?Baseline: R 35.06, L 38.53 ?Goal status: INITIAL ?  ?6.  Pt will demonstrate ability to retrieve a 3 lbs weight from overhead shelf with left and right UE's individually demonstrating good control. ?Baseline: decreased bilateral strength and control. ?Goal status: INITIAL ?  ?ASSESSMENT: ?  ?CLINICAL IMPRESSION: Pt limited by pain in head, neck, back, and legs.  Patient with tremors today exacerbated by movement.  Did best with slow controlled closed  chain movement.  Ataxia present especially w/ open chain activities (LUE > RUE) ? ?  ?PERFORMANCE DEFICITS in functional skills including ADLs, IADLs, coordination, dexterity, sensation, ROM, strength, pain, fascial

## 2021-12-12 NOTE — Patient Instructions (Signed)
Access Code: [email or web address] ?URL: [email or web address] ?Date: 12/12/2021 ?Prepared by: Elease Etienne ? ?Exercises ?- Sit to Stand Without Arm Support  - 1 x daily - 7 x weekly - 3 sets - 10 reps ?- Standing March with Counter Support  - 1 x daily - 7 x weekly - 3 sets - 10 reps ?- Chin Tuck  - 1 x daily - 7 x weekly - 3 sets - 10 reps ?- Seated Cervical Sidebending Stretch  - 1 x daily - 7 x weekly - 3 sets - 10 reps ?- Supine Bridge  - 1 x daily - 4 x weekly - 3 sets - 10 reps ?- Supine Active Straight Leg Raise  - 1 x daily - 4 x weekly - 2 sets - 8 reps ?- Forward and Backward Monster Walk with Counter Support  - 1 x daily - 4 x weekly - 3 sets - 10 reps ?- Seated Heel Raise  - 1 x daily - 4 x weekly - 2 sets - 12 reps ?

## 2021-12-12 NOTE — Therapy (Signed)
?OUTPATIENT PHYSICAL THERAPY TREATMENT NOTE ? ? ?Patient Name: Virginia Luna ?MRN: [id number] ?DOB:March 18, 1961, 61 y.o., female ?Today's Date: 12/13/2021 ? ?PCP: Shirline Frees, MD ?REFERRING PROVIDER: Courtney Heys, MD   ? ?END OF SESSION:  ? PT End of Session - 12/12/21 1022   ? ? Visit Number 3   ? Number of Visits 13   ? Date for PT Re-Evaluation 02/23/22   ? Authorization Type [employer]IC[employer] HMO   ? PT Start Time 1018   ? PT Stop Time 1100   ? PT Time Calculation (min) 42 min   ? Equipment Utilized During Treatment Gait belt   ? Activity Tolerance Patient limited by fatigue;Patient tolerated treatment well   ? Behavior During Therapy [facility] for tasks assessed/performed   ? ?  ?  ? ?  ? ? ?Past Medical History:  ?Diagnosis Date  ? Allergies   ? Anemia   ? Arthritis   ? Asthma   ? Back pain   ? Chronic pain   ? Complication of anesthesia   ? woke up during colonoscopy  ? Diabetes ([place])   ? Diabetes mellitus without complication ([place])   ? Edema, lower extremity   ? Fibroid   ? Fibromyalgia   ? Chronic  ? GERD (gastroesophageal reflux disease)   ? Headache   ? migraines  ? High blood pressure   ? History of hiatal hernia   ? History of stomach ulcers   ? IBS (irritable bowel syndrome)   ? Joint pain   ? Sleep apnea   ? did use cpap-lost 25lb-says she does not need it   NO CPAP  ? Wears contact lenses   ? ?Past Surgical History:  ?Procedure Laterality Date  ? BRAIN SURGERY Left 11/07/2021  ? [facility] Dr. Herschel [place]  ? BREAST BIOPSY    ? BREAST EXCISIONAL BIOPSY    ? BREAST LUMPECTOMY WITH RADIOACTIVE SEED LOCALIZATION Bilateral 11/24/2014  ? Procedure: BILATERAL BREAST LUMPECTOMY WITH RADIOACTIVE SEED LOCALIZATION;  Surgeon: [name], MD;  Location: [place];  Service: General;  Laterality: Bilateral;  ? CHOLECYSTECTOMY    ? COLONOSCOPY    ? DILATION AND CURETTAGE OF UTERUS    ? ESOPHAGOGASTRODUODENOSCOPY (EGD) WITH PROPOFOL N/A 07/23/2016  ? Procedure: ESOPHAGOGASTRODUODENOSCOPY (EGD) WITH  PROPOFOL;  Surgeon: Laurence Spates, MD;  [facility];  Service: Endoscopy;  Laterality: N/A;  ? ESOPHAGOGASTRODUODENOSCOPY (EGD) WITH PROPOFOL N/A 06/11/2018  ? Procedure: ESOPHAGOGASTRODUODENOSCOPY (EGD) WITH PROPOFOL;  Surgeon: Laurence Spates, MD;  Location: [facility];  Service: Endoscopy;  Laterality: N/A;  ? LUMBAR LAMINECTOMY  2010  ? ORIF WRIST FRACTURE Right 11/24/2020  ? Procedure: OPEN REDUCTION INTERNAL FIXATION RIGHT WRIST FRACTURE;  Surgeon: Renette Butters, MD;  Location: [facility];  Service: Orthopedics;  Laterality: Right;  ? UMBILICAL HERNIA REPAIR    ? age 7  ? UPPER GI ENDOSCOPY    ? ?Patient Active Problem List  ? Diagnosis Date Noted  ? Cerebellar tumor ([place]) 11/15/2021  ? Comorbid sleep-related hypoventilation 10/19/2021  ? Urinary incontinence/areflexic bladder 10/05/2021  ? OSA, unable to tolerate CPAP (vomited every night) 09/05/2021  ? Chronic pain syndrome 09/05/2021  ? Chronic nausea 09/05/2021  ? Neurogenic bladder, followed by Urology, instructed I&O cath BID 09/05/2021  ? Intolerance of continuous positive airway pressure (CPAP) ventilation 09/03/2021  ? Insomnia secondary to chronic pain 07/24/2021  ? Primary osteoarthritis of right knee 07/03/2021  ? Myofascial pain dysfunction syndrome 07/03/2021  ? Fibromyalgia 07/03/2021  ? Hyponatremia  07/03/2021  ? Primary osteoarthritis of left knee 07/03/2021  ? Body mass index (BMI) 50.0-59.9, adult ([place]) 03/07/2021  ? Allergic rhinitis due to animal (cat) (dog) hair and dander 10/06/2020  ? Allergic rhinitis due to pollen 10/06/2020  ? Moderate persistent asthma, uncomplicated 69/62/9528  ? Cerebellar mass 10/06/2020  ? Paresthesia 05/27/2020  ? Diabetes mellitus ([place]) 05/25/2020  ? Hypertension associated with diabetes ([place]) 05/25/2020  ? Hyperlipidemia associated with type 2 diabetes mellitus ([place]) 05/25/2020  ? Vitamin D deficiency 05/25/2020  ? NAFLD (nonalcoholic fatty liver disease) 05/25/2020  ? Sciatica of left side  05/25/2020  ? ? ?REFERRING DIAG: D49.6 (ICD-10-CM) - Neoplasm of unspecified behavior of brain   ? ?THERAPY DIAG:  ?Unsteadiness on feet ? ?Other lack of coordination ? ?Muscle weakness (generalized) ? ?Other abnormalities of gait and mobility ? ?PERTINENT HISTORY: From H&P note on 11/15/2021:  "On the day of admission 11/07/2021, the patient was taken to the operating room by Dr. Recardo Evangelist of neurosurgery and underwent left suboccipital craniotomy, resection of cerebellar tumor. She tolerated the procedure well. Postoperative MRI performed on 3/22 revealed a small area of diffusion restriction in the left cerebellum."  Patient transferred to [place] on 11/15/2021, discharged 11/24/2021 per pt.  fibromyalgia, DM2, asthma, HTN, OSA, migraines, bilateral knee OA, myofascial pain syndrome ?  ? ?PRECAUTIONS: Fall ? ?SUBJECTIVE: Pt having increased pain today expressing that she plans to reach out to MD about it.  No falls.  Pt stood and washed dishes last night. ? ?PAIN:  ?Are you having pain? Yes: NPRS scale: 8.5/10 ?Pain location: All over (fibromyalgia), worse in head and neck and BLE ? ?OBJECTIVE:  ?  ?DIAGNOSTIC FINDINGS: From Brain MRI on 09/02/2021:  "Increased size of left superior cerebellar vermis mass, now ?measuring 1.5 x 1.5 cm, previously 1.1 x 1.0 cm. Increased mild surrounding edema."  Future brain MRI ordered w/o date visible at this time. ?  ?COGNITION: ?Overall cognitive status: No family/caregiver present to determine baseline cognitive functioning - pt states "I feel stupid", she elaborates that she is having a hard time processing. ?  ?  ?TODAY'S TREATMENT:  ?Ther Ex ?Supine bridging 3x10; pt demos dec ROM throughout ?Supine SLR 2x8 each LE; visual cue provided for height of lift-pt demonstrates jerky/writhing movements of the LE and trunk midway through second set requiring prolonged rest, she was alert and responsive stating this occurs sometimes with activity. ?Assessed Left BP sitting EOM:  121/88,  HR 91bpm ?Attempted heel raises at countertop x6 w/ pt unable to adequately clear heel with repetition so regressed to seated EOM 2x10 ? ?NMR ?Forward and backward Monster walks at countertop using RUE support and no resistance 10' x8 each ? ? ?  ?PATIENT EDUCATION: ?Education details: Additions to HEP. ?Person educated: Patient ?Education method: Explanation and handout ?Education comprehension: verbalized understanding and needs further education ?  ?  ?HOME EXERCISE PROGRAM: ?Access Code: [email or web address] ?URL: [email or web address] ?Date: 12/06/2021 ?Prepared by: Mickie Bail Plaster ? ?Exercises ?- Sit to Stand Without Arm Support  - 1 x daily - 7 x weekly - 3 sets - 10 reps ?- Standing March with Counter Support  - 1 x daily - 7 x weekly - 3 sets - 10 reps ?- Chin Tuck  - 1 x daily - 7 x weekly - 3 sets - 10 reps ?- Seated Cervical Sidebending Stretch  - 1 x daily - 7 x weekly - 3 sets - 10 reps ? - Supine Bridge  - 1 x daily -  4 x weekly - 3 sets - 10 reps ?- Supine Active Straight Leg Raise  - 1 x daily - 4 x weekly - 2 sets - 8 reps ?- Forward and Backward Monster Walk with Counter Support  - 1 x daily - 4 x weekly - 3 sets - 10 reps ?- Seated Heel Raise  - 1 x daily - 4 x weekly - 2 sets - 12 reps ?  ?GOALS: ?Goals reviewed with patient? Yes ?  ?SHORT TERM GOALS: Target date:  01/12/2022 ?  ?Pt will be independent with initial strength and balance HEP with supervision from family. ?Baseline:  To be established. ?Goal status: INITIAL ?  ?2.  Pt will decrease 5xSTS to <35 seconds w/ BUE support in order to demonstrate decreased risk for falls and improved functional bilateral LE strength and power. ?Baseline: 38.51sec w/ BUE ?Goal status: INITIAL ?  ?3.  Pt will demonstrate a gait speed of >1 feet/sec in order to decrease risk for falls. ?Baseline: 0.76 ft/sec ?Goal status: INITIAL ?  ?4.  Pt will improve normal TUG to less than or equal to 30 seconds w/LRAD for improved functional mobility and decreased fall  risk. ? ?Baseline: 34.87s w/RW and S* ?Goal status: INITIAL ?  ?5.  Pt will improve Berg score to 41/56 for decreased fall risk ? ?Baseline: 35/56 on 4/19 ?Goal status: INITIAL ?  ?LONG TERM GOALS: Target date

## 2021-12-12 NOTE — Therapy (Signed)
?OUTPATIENT SPEECH LANGUAGE PATHOLOGY TREATMENT NOTE ? ? ?Patient Name: Virginia Luna ?MRN: [id number] ?DOB:02/16/1961, 61 y.o., female ?Today's Date: 12/12/2021 ? ?PCP: Shirline Frees, MD ?REFERRING PROVIDER: Barbie Banner, PA-C  ? ?END OF SESSION:  ? End of Session - 12/12/21 0847   ? ? Visit Number 3   ? Number of Visits 25   ? Date for SLP Re-Evaluation 02/20/22   ? Authorization Type [employer]   ? Progress Note Due on Visit 10   ? SLP Start Time 0845   ? SLP Stop Time  0930   ? SLP Time Calculation (min) 45 min   ? Activity Tolerance Patient limited by fatigue;Patient tolerated treatment well   ? ?  ?  ? ?  ? ? ? ?Past Medical History:  ?Diagnosis Date  ? Allergies   ? Anemia   ? Arthritis   ? Asthma   ? Back pain   ? Chronic pain   ? Complication of anesthesia   ? woke up during colonoscopy  ? Diabetes ([place])   ? Diabetes mellitus without complication ([place])   ? Edema, lower extremity   ? Fibroid   ? Fibromyalgia   ? Chronic  ? GERD (gastroesophageal reflux disease)   ? Headache   ? migraines  ? High blood pressure   ? History of hiatal hernia   ? History of stomach ulcers   ? IBS (irritable bowel syndrome)   ? Joint pain   ? Sleep apnea   ? did use cpap-lost 25lb-says she does not need it   NO CPAP  ? Wears contact lenses   ? ?Past Surgical History:  ?Procedure Laterality Date  ? BRAIN SURGERY Left 11/07/2021  ? [facility] Dr. Herschel [place]  ? BREAST BIOPSY    ? BREAST EXCISIONAL BIOPSY    ? BREAST LUMPECTOMY WITH RADIOACTIVE SEED LOCALIZATION Bilateral 11/24/2014  ? Procedure: BILATERAL BREAST LUMPECTOMY WITH RADIOACTIVE SEED LOCALIZATION;  Surgeon: [name], MD;  Location: [place];  Service: General;  Laterality: Bilateral;  ? CHOLECYSTECTOMY    ? COLONOSCOPY    ? DILATION AND CURETTAGE OF UTERUS    ? ESOPHAGOGASTRODUODENOSCOPY (EGD) WITH PROPOFOL N/A 07/23/2016  ? Procedure: ESOPHAGOGASTRODUODENOSCOPY (EGD) WITH PROPOFOL;  Surgeon: Laurence Spates, MD;  [facility];   Service: Endoscopy;  Laterality: N/A;  ? ESOPHAGOGASTRODUODENOSCOPY (EGD) WITH PROPOFOL N/A 06/11/2018  ? Procedure: ESOPHAGOGASTRODUODENOSCOPY (EGD) WITH PROPOFOL;  Surgeon: Laurence Spates, MD;  [facility];  Service: Endoscopy;  Laterality: N/A;  ? LUMBAR LAMINECTOMY  2010  ? ORIF WRIST FRACTURE Right 11/24/2020  ? Procedure: OPEN REDUCTION INTERNAL FIXATION RIGHT WRIST FRACTURE;  Surgeon: Renette Butters, MD;  [facility];  Service: Orthopedics;  Laterality: Right;  ? UMBILICAL HERNIA REPAIR    ? age 65  ? UPPER GI ENDOSCOPY    ? ?Patient Active Problem List  ? Diagnosis Date Noted  ? Cerebellar tumor ([place]) 11/15/2021  ? Comorbid sleep-related hypoventilation 10/19/2021  ? Urinary incontinence/areflexic bladder 10/05/2021  ? OSA, unable to tolerate CPAP (vomited every night) 09/05/2021  ? Chronic pain syndrome 09/05/2021  ? Chronic nausea 09/05/2021  ? Neurogenic bladder, followed by Urology, instructed I&O cath BID 09/05/2021  ? Intolerance of continuous positive airway pressure (CPAP) ventilation 09/03/2021  ? Insomnia secondary to chronic pain 07/24/2021  ? Primary osteoarthritis of right knee 07/03/2021  ? Myofascial pain dysfunction syndrome 07/03/2021  ? Fibromyalgia 07/03/2021  ? Hyponatremia 07/03/2021  ? Primary osteoarthritis of left knee 07/03/2021  ?  Body mass index (BMI) 50.0-59.9, adult ([place]) 03/07/2021  ? Allergic rhinitis due to animal (cat) (dog) hair and dander 10/06/2020  ? Allergic rhinitis due to pollen 10/06/2020  ? Moderate persistent asthma, uncomplicated 01/75/1025  ? Cerebellar mass 10/06/2020  ? Paresthesia 05/27/2020  ? Diabetes mellitus ([place]) 05/25/2020  ? Hypertension associated with diabetes ([place]) 05/25/2020  ? Hyperlipidemia associated with type 2 diabetes mellitus ([place]) 05/25/2020  ? Vitamin D deficiency 05/25/2020  ? NAFLD (nonalcoholic fatty liver disease) 05/25/2020  ? Sciatica of left side 05/25/2020  ? ? ?ONSET DATE: March 2023 (surgery  11/07/2021) ? ?REFERRING DIAG: D49.6 (ICD-10-CM) - Cerebellar tumor ([place])  ? ?THERAPY DIAG:  ?Cognitive communication deficit ? ?SUBJECTIVE: "I don't remember" ? ?PAIN:  ?Are you having pain? Yes ?NPRS scale: 8/10 ?Pain location: head, legs ? ?OBJECTIVE:  ? ?TODAY'S TREATMENT: ?SKILLED INTERVENTIONS:  ?12-12-21: Completed CLQT this date. Low WNL for domains of attention, ex functions, visuospatial skills. Overall severity rating of mild. Provide education on internal vs. external memory strategies to include use of device, lists, calendar/planner, association, repetition, and creating a story. Generated list with pt of information she would like to be able to recall. Use of association during structured task, pt with 100% accuracy recalling paired items with mod-A faded to mod-I as task progressed.  ? ?12-06-21: Pt reports to using computer and paper planner to manage appointments. Discussed reasoning why pt is using both and encouraged her to continue to promote IND in orientation and managing schedule. ST provides education on establishing routine for taking meds, encouraged pt to think about best time to take. Pt reports to planning to move home this week. Encouraged pt to try managing meds IND this week with god-daughter supervising to ensure she is prepared to do it w/o her assistance once she moves. Initiated CLQT this date. Pt demonstrates poor frustration tolerance throughout tasks, becoming easily discouraged. ST provides reassurance of nature of assessment just to gain information. Pt scores moderate for memory and mild for language. Other domains not yet complete. ? ?PATIENT EDUCATION: ?Education details: see above ?Person educated: Patient ?Education method: Explanation, Demonstration, and Handouts ?Education comprehension: verbalized understanding, returned demonstration, and needs further education ?  ?  ?GOALS: ?Goals reviewed with patient? Yes ?  ?SHORT TERM GOALS: Target date: 12/27/2021 ?  ?Pt will  complete standardized cognitive assessment to better appreciate reported cognitive deficits within first 2 sessions.  ?Baseline: 12-06-21, 12-12-21 ?Goal status: goal met ?  ?2.  Pt will verbalize and implement 2 attention and memory compensations to aid daily functioning given occasional min A over 2 sessions ?Baseline:  ?Goal status: ongoing ?  ?3.  Pt will demonstrate successful usage of anomia compensations, with occasional min-A, to make 5 minute mod complex conversation functional over 2 sessions ?Baseline:  ?Goal status: ongoing ?  ?4.  Pt will demonstrate dysarthria compensations on structured speech tasks with 80% accuracy given occasional min A over 2 sessions ?Baseline:  ?Goal status: ongoing ?  ?  ?LONG TERM GOALS: Target date: 02/21/2022 ?  ?Pt will verbalize x3 functional areas in which implementation of attention/memory compensations/strategies have resulted in increased successful participation in desired activities.  ?Baseline:  ?Goal status: ongoing ?  ?2.  Pt will report completion of dysarthria HEP at least 1x/day (rec BID) over 1 week period with rare min-A.  ?Baseline:  ?Goal status: ongoing ?  ?3.  Pt will report improvement on CPIB by 3 points by d/c.  ?Baseline:  ?Goal status: ongoing ?  ?  4.  Pt will utilize anomia compensations when word finding occurs in 20+ minute conversation with rare min A ?Baseline:  ?Goal status: ongoing ?  ?  ?ASSESSMENT: ?  ?CLINICAL IMPRESSION: ?Patient is a 62 y.o. F who was seen today for cognitive communication deficits related to cognition, speech, and language s/p cerebellar tumor resection and CVA. Pt demonstrates significantly impacted processing, requiring extended time to process visual and auditory information and form appropriate responses in conversation. Auditory processing impairments observed in y/n questions, pt with high accuracy, however requires repetition from ST and to self + extended period to generate response. Anomia demonstrated and frequent  use of vague language without awareness. W/o cueing, pt not aware that ST lacks insight into vague references and requires min-A to repair communication breakdowns. Demonstrates memory impairments in simple tasks such as stating

## 2021-12-14 ENCOUNTER — Ambulatory Visit: Payer: Medicare HMO | Admitting: Occupational Therapy

## 2021-12-14 ENCOUNTER — Encounter: Payer: Self-pay | Admitting: Occupational Therapy

## 2021-12-14 ENCOUNTER — Ambulatory Visit: Payer: Medicare HMO | Admitting: Physical Therapy

## 2021-12-14 DIAGNOSIS — R2681 Unsteadiness on feet: Secondary | ICD-10-CM

## 2021-12-14 DIAGNOSIS — R208 Other disturbances of skin sensation: Secondary | ICD-10-CM | POA: Diagnosis not present

## 2021-12-14 DIAGNOSIS — R41841 Cognitive communication deficit: Secondary | ICD-10-CM | POA: Diagnosis not present

## 2021-12-14 DIAGNOSIS — R278 Other lack of coordination: Secondary | ICD-10-CM

## 2021-12-14 DIAGNOSIS — R41840 Attention and concentration deficit: Secondary | ICD-10-CM | POA: Diagnosis not present

## 2021-12-14 DIAGNOSIS — R2689 Other abnormalities of gait and mobility: Secondary | ICD-10-CM | POA: Diagnosis not present

## 2021-12-14 DIAGNOSIS — R41844 Frontal lobe and executive function deficit: Secondary | ICD-10-CM | POA: Diagnosis not present

## 2021-12-14 DIAGNOSIS — M6281 Muscle weakness (generalized): Secondary | ICD-10-CM

## 2021-12-14 DIAGNOSIS — Z483 Aftercare following surgery for neoplasm: Secondary | ICD-10-CM | POA: Diagnosis not present

## 2021-12-14 NOTE — Therapy (Signed)
?OUTPATIENT PHYSICAL THERAPY TREATMENT NOTE ? ? ?Patient Name: Virginia Luna ?MRN: [id number] ?DOB:06/29/1961, 61 y.o., female ?Today's Date: 12/14/2021 ? ?PCP: Shirline Frees, MD ?REFERRING PROVIDER: Courtney Heys, MD   ? ?END OF SESSION:  ? PT End of Session - 12/14/21 0935   ? ? Visit Number 4   ? Number of Visits 13   ? Date for PT Re-Evaluation 02/23/22   ? Authorization Type [employer] MEDICARE HMO   ? PT Start Time 6712   ? PT Stop Time 1013   ? PT Time Calculation (min) 38 min   ? Equipment Utilized During Treatment Gait belt   ? Activity Tolerance Patient limited by fatigue;Patient tolerated treatment well   ? Behavior During Therapy [facility] for tasks assessed/performed   ? ?  ?  ? ?  ? ? ? ?Past Medical History:  ?Diagnosis Date  ? Allergies   ? Anemia   ? Arthritis   ? Asthma   ? Back pain   ? Chronic pain   ? Complication of anesthesia   ? woke up during colonoscopy  ? Diabetes ([place])   ? Diabetes mellitus without complication ([place])   ? Edema, lower extremity   ? Fibroid   ? Fibromyalgia   ? Chronic  ? GERD (gastroesophageal reflux disease)   ? Headache   ? migraines  ? High blood pressure   ? History of hiatal hernia   ? History of stomach ulcers   ? IBS (irritable bowel syndrome)   ? Joint pain   ? Sleep apnea   ? did use cpap-lost 25lb-says she does not need it   NO CPAP  ? Wears contact lenses   ? ?Past Surgical History:  ?Procedure Laterality Date  ? BRAIN SURGERY Left 11/07/2021  ? [facility] Dr. Herschel [place]  ? BREAST BIOPSY    ? BREAST EXCISIONAL BIOPSY    ? BREAST LUMPECTOMY WITH RADIOACTIVE SEED LOCALIZATION Bilateral 11/24/2014  ? Procedure: BILATERAL BREAST LUMPECTOMY WITH RADIOACTIVE SEED LOCALIZATION;  Surgeon: [name], MD;  Location: [place];  Service: General;  Laterality: Bilateral;  ? CHOLECYSTECTOMY    ? COLONOSCOPY    ? DILATION AND CURETTAGE OF UTERUS    ? ESOPHAGOGASTRODUODENOSCOPY (EGD) WITH PROPOFOL N/A 07/23/2016  ? Procedure: ESOPHAGOGASTRODUODENOSCOPY (EGD) WITH  PROPOFOL;  Surgeon: Laurence Spates, MD;  Location: [facility];  Service: Endoscopy;  Laterality: N/A;  ? ESOPHAGOGASTRODUODENOSCOPY (EGD) WITH PROPOFOL N/A 06/11/2018  ? Procedure: ESOPHAGOGASTRODUODENOSCOPY (EGD) WITH PROPOFOL;  Surgeon: Laurence Spates, MD;  Location: [facility];  Service: Endoscopy;  Laterality: N/A;  ? LUMBAR LAMINECTOMY  2010  ? ORIF WRIST FRACTURE Right 11/24/2020  ? Procedure: OPEN REDUCTION INTERNAL FIXATION RIGHT WRIST FRACTURE;  Surgeon: Renette Butters, MD;  Location: [facility];  Service: Orthopedics;  Laterality: Right;  ? UMBILICAL HERNIA REPAIR    ? age 39  ? UPPER GI ENDOSCOPY    ? ?Patient Active Problem List  ? Diagnosis Date Noted  ? Cerebellar tumor ([place]) 11/15/2021  ? Comorbid sleep-related hypoventilation 10/19/2021  ? Urinary incontinence/areflexic bladder 10/05/2021  ? OSA, unable to tolerate CPAP (vomited every night) 09/05/2021  ? Chronic pain syndrome 09/05/2021  ? Chronic nausea 09/05/2021  ? Neurogenic bladder, followed by Urology, instructed I&O cath BID 09/05/2021  ? Intolerance of continuous positive airway pressure (CPAP) ventilation 09/03/2021  ? Insomnia secondary to chronic pain 07/24/2021  ? Primary osteoarthritis of right knee 07/03/2021  ? Myofascial pain dysfunction syndrome 07/03/2021  ? Fibromyalgia 07/03/2021  ?  Hyponatremia 07/03/2021  ? Primary osteoarthritis of left knee 07/03/2021  ? Body mass index (BMI) 50.0-59.9, adult ([place]) 03/07/2021  ? Allergic rhinitis due to animal (cat) (dog) hair and dander 10/06/2020  ? Allergic rhinitis due to pollen 10/06/2020  ? Moderate persistent asthma, uncomplicated 73/41/9379  ? Cerebellar mass 10/06/2020  ? Paresthesia 05/27/2020  ? Diabetes mellitus ([place]) 05/25/2020  ? Hypertension associated with diabetes ([place]) 05/25/2020  ? Hyperlipidemia associated with type 2 diabetes mellitus ([place]) 05/25/2020  ? Vitamin D deficiency 05/25/2020  ? NAFLD (nonalcoholic fatty liver disease) 05/25/2020  ? Sciatica of left side  05/25/2020  ? ? ?REFERRING DIAG: D49.6 (ICD-10-CM) - Neoplasm of unspecified behavior of brain   ? ?THERAPY DIAG:  ?Muscle weakness (generalized) ? ?Other abnormalities of gait and mobility ? ?Unsteadiness on feet ? ?PERTINENT HISTORY: From H&P note on 11/15/2021:  "On the day of admission 11/07/2021, the patient was taken to the operating room by Dr. Recardo Evangelist of neurosurgery and underwent left suboccipital craniotomy, resection of cerebellar tumor. She tolerated the procedure well. Postoperative MRI performed on 3/22 revealed a small area of diffusion restriction in the left cerebellum."  Patient transferred to [facility] on 11/15/2021, discharged 11/24/2021 per pt.  fibromyalgia, DM2, asthma, HTN, OSA, migraines, bilateral knee OA, myofascial pain syndrome ?  ? ?PRECAUTIONS: Fall ? ?SUBJECTIVE: Pt reports being super sensitive to sound, still is unable to see well. Having a lot of pain today, more so in LLE. "I am not me right now, I feel like I am screaming"  ? ?PAIN:  ?Are you having pain? Yes: NPRS scale: 8/10 ?Pain location: All over (fibromyalgia), worse in head and neck and BLE (LLE worse) ? ?OBJECTIVE:  ?  ?DIAGNOSTIC FINDINGS: From Brain MRI on 09/02/2021:  "Increased size of left superior cerebellar vermis mass, now ?measuring 1.5 x 1.5 cm, previously 1.1 x 1.0 cm. Increased mild surrounding edema."  Future brain MRI ordered w/o date visible at this time. ?  ?COGNITION: ?Overall cognitive status: No family/caregiver present to determine baseline cognitive functioning - pt states "I feel stupid", she elaborates that she is having a hard time processing. ?  ?  ?TODAY'S TREATMENT:  ?Ther Ex ?SciFit level 1.5 for 8 minutes using BLE/BUEs for dynamic cardiovascular warmup, endurance and BLE strength. Min cues to maintain pace throughout and pt able to use BLEs only for ~2 minutes ? ?NMR ?In // bars for improved eccentric control of BLEs, single leg stability and hip strength: ?-Lateral step downs from 4" step w/BUE  support on rail w/emphasis on slowly lowering foot to ground and maintaining weight on stance leg, x20 per side w/S*  ?-Alt. Forward step downs from 4" step w/BUE support w/emphasis on tapping foot to ground only, x20 per side.  ?-Standing glute kickbacks, x20 per side, w/BUE support. Min multimodal cues to maintain trunk extension throughout to isolate glutes.  ?-Standing alt. hip abduction, x20 per side, w/BUE support. Min cues to reduce trunk compensation.  ? ?  ?PATIENT EDUCATION: ?Education details: Continuing HEP  ?Person educated: Patient ?Education method: Explanation  ?Education comprehension: verbalized understanding and needs further education ?  ?  ?HOME EXERCISE PROGRAM: ?Access Code: [email or web address] ?URL: [email or web address] ?Date: 12/06/2021 ?Prepared by: Mickie Bail Vaudine Dutan ? ?Exercises ?- Sit to Stand Without Arm Support  - 1 x daily - 7 x weekly - 3 sets - 10 reps ?- Standing March with Counter Support  - 1 x daily - 7 x weekly - 3 sets - 10 reps ?Alesia Banda  -  1 x daily - 7 x weekly - 3 sets - 10 reps ?- Seated Cervical Sidebending Stretch  - 1 x daily - 7 x weekly - 3 sets - 10 reps ? - Supine Bridge  - 1 x daily - 4 x weekly - 3 sets - 10 reps ?- Supine Active Straight Leg Raise  - 1 x daily - 4 x weekly - 2 sets - 8 reps ?- Forward and Backward Monster Walk with Counter Support  - 1 x daily - 4 x weekly - 3 sets - 10 reps ?- Seated Heel Raise  - 1 x daily - 4 x weekly - 2 sets - 12 reps ?  ?GOALS: ?Goals reviewed with patient? Yes ?  ?SHORT TERM GOALS: Target date:  01/12/2022 ?  ?Pt will be independent with initial strength and balance HEP with supervision from family. ?Baseline:  To be established. ?Goal status: INITIAL ?  ?2.  Pt will decrease 5xSTS to <35 seconds w/ BUE support in order to demonstrate decreased risk for falls and improved functional bilateral LE strength and power. ?Baseline: 38.51sec w/ BUE ?Goal status: INITIAL ?  ?3.  Pt will demonstrate a gait speed of >1 feet/sec  in order to decrease risk for falls. ?Baseline: 0.76 ft/sec ?Goal status: INITIAL ?  ?4.  Pt will improve normal TUG to less than or equal to 30 seconds w/LRAD for improved functional mobility and decreased fal

## 2021-12-14 NOTE — Therapy (Signed)
?OUTPATIENT OCCUPATIONAL THERAPY TREATMENT NOTE ? ? ?Patient Name: Virginia Luna ?MRN: [id number] ?DOB:Aug 07, 1961, 61 y.o., female ?Today's Date: 12/14/2021 ? ?PCP: Shirline Frees, MD ?REFERRING PROVIDER: Dr.Lovorn/ Risa Grill PA-C  ? ?END OF SESSION:  ? OT End of Session - 12/14/21 0835   ? ? Visit Number 4   ? Number of Visits 25   ? Date for OT Re-Evaluation 02/20/22   ? Authorization Type [employer]   ? Authorization - Visit Number 4   ? Authorization - Number of Visits 10   ? Progress Note Due on Visit 10   ? OT Start Time [id number]   ? OT Stop Time 0925   ? OT Time Calculation (min) 47 min   ? Activity Tolerance Patient limited by pain   ? Behavior During Therapy [place] affect   ? ?  ?  ? ?  ? ? ?Past Medical History:  ?Diagnosis Date  ? Allergies   ? Anemia   ? Arthritis   ? Asthma   ? Back pain   ? Chronic pain   ? Complication of anesthesia   ? woke up during colonoscopy  ? Diabetes ([place])   ? Diabetes mellitus without complication ([place])   ? Edema, lower extremity   ? Fibroid   ? Fibromyalgia   ? Chronic  ? GERD (gastroesophageal reflux disease)   ? Headache   ? migraines  ? High blood pressure   ? History of hiatal hernia   ? History of stomach ulcers   ? IBS (irritable bowel syndrome)   ? Joint pain   ? Sleep apnea   ? did use cpap-lost 25lb-says she does not need it   NO CPAP  ? Wears contact lenses   ? ?Past Surgical History:  ?Procedure Laterality Date  ? BRAIN SURGERY Left 11/07/2021  ? [facility] Dr. Herschel [place]  ? BREAST BIOPSY    ? BREAST EXCISIONAL BIOPSY    ? BREAST LUMPECTOMY WITH RADIOACTIVE SEED LOCALIZATION Bilateral 11/24/2014  ? Procedure: BILATERAL BREAST LUMPECTOMY WITH RADIOACTIVE SEED LOCALIZATION;  Surgeon: [name], MD;  Location: [place];  Service: General;  Laterality: Bilateral;  ? CHOLECYSTECTOMY    ? COLONOSCOPY    ? DILATION AND CURETTAGE OF UTERUS    ? ESOPHAGOGASTRODUODENOSCOPY (EGD) WITH PROPOFOL N/A 07/23/2016  ? Procedure: ESOPHAGOGASTRODUODENOSCOPY  (EGD) WITH PROPOFOL;  Surgeon: Laurence Spates, MD;  [facility];  Service: Endoscopy;  Laterality: N/A;  ? ESOPHAGOGASTRODUODENOSCOPY (EGD) WITH PROPOFOL N/A 06/11/2018  ? Procedure: ESOPHAGOGASTRODUODENOSCOPY (EGD) WITH PROPOFOL;  Surgeon: Laurence Spates, MD;  [facility];  Service: Endoscopy;  Laterality: N/A;  ? LUMBAR LAMINECTOMY  2010  ? ORIF WRIST FRACTURE Right 11/24/2020  ? Procedure: OPEN REDUCTION INTERNAL FIXATION RIGHT WRIST FRACTURE;  Surgeon: Renette Butters, MD;  [facility];  Service: Orthopedics;  Laterality: Right;  ? UMBILICAL HERNIA REPAIR    ? age 45  ? UPPER GI ENDOSCOPY    ? ?Patient Active Problem List  ? Diagnosis Date Noted  ? Cerebellar tumor ([place]) 11/15/2021  ? Comorbid sleep-related hypoventilation 10/19/2021  ? Urinary incontinence/areflexic bladder 10/05/2021  ? OSA, unable to tolerate CPAP (vomited every night) 09/05/2021  ? Chronic pain syndrome 09/05/2021  ? Chronic nausea 09/05/2021  ? Neurogenic bladder, followed by Urology, instructed I&O cath BID 09/05/2021  ? Intolerance of continuous positive airway pressure (CPAP) ventilation 09/03/2021  ? Insomnia secondary to chronic pain 07/24/2021  ? Primary osteoarthritis of right knee 07/03/2021  ? Myofascial  pain dysfunction syndrome 07/03/2021  ? Fibromyalgia 07/03/2021  ? Hyponatremia 07/03/2021  ? Primary osteoarthritis of left knee 07/03/2021  ? Body mass index (BMI) 50.0-59.9, adult ([place]) 03/07/2021  ? Allergic rhinitis due to animal (cat) (dog) hair and dander 10/06/2020  ? Allergic rhinitis due to pollen 10/06/2020  ? Moderate persistent asthma, uncomplicated 47/82/9562  ? Cerebellar mass 10/06/2020  ? Paresthesia 05/27/2020  ? Diabetes mellitus ([place]) 05/25/2020  ? Hypertension associated with diabetes ([place]) 05/25/2020  ? Hyperlipidemia associated with type 2 diabetes mellitus ([place]) 05/25/2020  ? Vitamin D deficiency 05/25/2020  ? NAFLD (nonalcoholic fatty liver disease) 05/25/2020  ? Sciatica of left  side 05/25/2020  ? ? ?ONSET DATE: 11/07/21  ? ?REFERRING DIAG: cerebellar tumor, s/p craniotomy  ? ?THERAPY DIAG:  ?Other lack of coordination ? ?Muscle weakness (generalized) ? ?Unsteadiness on feet ? ? ?PERTINENT HISTORY: [name] is a 61 year old female who recently presented to hospitalized at [place] in [facility] complaining of headaches and tremors. 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 She was admitted 11/07/2021, underwent left suboccipital craniotomy, resection of cerebellar tumor. Postoperative MRI performed on 3/22 revealed a small area of diffusion restriction in the left cerebellum Pt was transferred to  [facility] 11/15/21 and  she was d/c home 11/24/21.PMH: arthritis, asthma, chronic pain, DM, fibromyalgia, myofascial pain syndrome, HTN, IBS, sleep apnea Brain ?  ?PRECAUTIONS: Fall, no driving ? ?SUBJECTIVE: Exercises are going ok, shoulder ex is harder b/c I'm so shaky ? ?PAIN:  ?Are you having pain? Yes: NPRS scale: 8/10 ?Pain location: head and neck, and legs ?Pain description: constant ?Aggravating factors: surgery, fibromyalgia, myofascial pain syndrome ?Relieving factors: meds ? ? ? ?OBJECTIVE:  ? ?TODAY'S TREATMENT:  ? ?Pt issued HEP - see pt instructions and below education.  ?Pt practiced body on arm movements over chair w/ wt shifts in semi standing position;  then practiced arm on body AA/ROM in BUE sh flexion (diagonal surface) closed chain.  ?Supine: chest press movemvent BUE's w/ less ataxia than w/ shoulder flexion using straight arm method.  ? ?Self care: discussed task modifications/adaptations for donning socks and practiced. Pt then shown A/E for tying shoes including spyrolaces and shoe buttons. Pt practiced use of shoe buttons w/ success. Pt issued handout on A/E and told how/where to purchase.  ? ? ?PATIENT EDUCATION: ?Education details: BUE HEP (12/14/21) ?Person educated: Patient ?Education method:  Explanation, Demonstration, Verbal cues, and Handouts ?Education comprehension: verbalized understanding and returned demonstration  ? ? ?PATIENT EDUCATION: ?Education details: putty HEP, coordination HEP, sh flex supine (12/06/21) ?Person educated: Patient ?Education method: Explanation, Demonstration, Verbal cues, and Handouts ?Education comprehension: verbalized understanding, returned demonstration, verbal cues required, and needs further education ? ? ?GOALS: ?  ?  ?SHORT TERM GOALS: Target date: 12/26/2021 ?  ?I with HEP ?  ?Goal status: ONGOING (issued 12/06/21) ?  ?2.  Pt will perform dressing mod I in a reasonable amount of time ?Baseline: supervision, increased time required ?Goal status: ONGOING ?  ?3.  Pt will perform basic home management with min A. ?Baseline: dependent ?Goal status: INITIAL ?  ?4.  Pt will perform simple cooking task with min a demonstrating good safety awareness. ?Baseline: dependent ?Goal status: INITIAL ?  ?5.  Pt will increase bilateral grip strength by 5 lbs for increased ease opening containers. ?Baseline RUE 21.1 lbs, LUE 14.9 lbs :  ?Goal status: INITIAL ?  ?6.  Pt will demonstrate ability to stand  for functional/ ADL tasks x 15 mins without rest break and no LOB. ?Baseline: poor and inconsistent endurance ?Goal status: INITIAL ?  ?LONG TERM GOALS: Target date: 02/20/2022 ?  ?I with updated HEP. ?  ?Goal status: INITIAL ?  ?2.  Pt will perform all basic ADLS mod I ?Baseline: supervision ?Goal status: INITIAL ?  ?3.  Pt will perform basic home management modified I ?Baseline: dependent ?Goal status: INITIAL ?  ?4.  Pt will perform basic cooking mod I demonstrating good safety awareness. ?Baseline: depdnent ?Goal status: INITIAL ?  ?5.  Pt will demonstrate improved fine motor coordination for ADLS as evidenced by decreasing 9 hole peg test score by 3 secs, bilaterally. ?Baseline: R 35.06, L 38.53 ?Goal status: INITIAL ?  ?6.  Pt will demonstrate ability to retrieve a 3 lbs weight from  overhead shelf with left and right UE's individually demonstrating good control. ?Baseline: decreased bilateral strength and control. ?Goal status: INITIAL ?  ?ASSESSMENT: ?  ?CLINICAL IMPRESSION:  ?Pt progr

## 2021-12-14 NOTE — Patient Instructions (Signed)
ELBOW: Extension / Chest Press (Frame) ? ? ? ?Lie on back with knees bent. Straighten elbows to raise frame. Hold ___ seconds. Use paper towel roll or shoe box (palms facing) _5-10__ reps per set, _2__ sets per day ? ?2. Place hands on sturdy chair and come up to squat position, rock slowly forwards and backwards, then side to side x 10 each way. Perform 2x/day ?

## 2021-12-17 DIAGNOSIS — I10 Essential (primary) hypertension: Secondary | ICD-10-CM | POA: Diagnosis not present

## 2021-12-18 DIAGNOSIS — E1159 Type 2 diabetes mellitus with other circulatory complications: Secondary | ICD-10-CM | POA: Diagnosis not present

## 2021-12-18 DIAGNOSIS — F329 Major depressive disorder, single episode, unspecified: Secondary | ICD-10-CM | POA: Diagnosis not present

## 2021-12-18 DIAGNOSIS — E118 Type 2 diabetes mellitus with unspecified complications: Secondary | ICD-10-CM | POA: Diagnosis not present

## 2021-12-18 DIAGNOSIS — G939 Disorder of brain, unspecified: Secondary | ICD-10-CM | POA: Diagnosis not present

## 2021-12-18 DIAGNOSIS — E1169 Type 2 diabetes mellitus with other specified complication: Secondary | ICD-10-CM | POA: Diagnosis not present

## 2021-12-18 DIAGNOSIS — E7800 Pure hypercholesterolemia, unspecified: Secondary | ICD-10-CM | POA: Diagnosis not present

## 2021-12-18 DIAGNOSIS — Z79899 Other long term (current) drug therapy: Secondary | ICD-10-CM | POA: Diagnosis not present

## 2021-12-18 NOTE — Therapy (Signed)
?OUTPATIENT OCCUPATIONAL THERAPY TREATMENT NOTE ? ? ?Patient Name: Virginia Luna ?MRN: [id number] ?DOB:08-15-1961, 61 y.o., female ?Today's Date: 12/19/2021 ? ?PCP: Shirline Frees, MD ?REFERRING PROVIDER: Dr.Lovorn/ Risa Grill PA-C  ? ?END OF SESSION:  ? OT End of Session - 12/19/21 4098   ? ? Visit Number 5   ? Number of Visits 25   ? Date for OT Re-Evaluation 02/20/22   ? Authorization Type [employer]   ? Authorization - Visit Number 5   ? Authorization - Number of Visits 10   ? Progress Note Due on Visit 10   ? OT Start Time 0805   ? OT Stop Time 0845   ? OT Time Calculation (min) 40 min   ? Activity Tolerance Patient limited by pain   ? Behavior During Therapy Flat affect   ? ?  ?  ? ?  ? ? ? ?Past Medical History:  ?Diagnosis Date  ? Allergies   ? Anemia   ? Arthritis   ? Asthma   ? Back pain   ? Chronic pain   ? Complication of anesthesia   ? woke up during colonoscopy  ? Diabetes ([place])   ? Diabetes mellitus without complication ([place])   ? Edema, lower extremity   ? Fibroid   ? Fibromyalgia   ? Chronic  ? GERD (gastroesophageal reflux disease)   ? Headache   ? migraines  ? High blood pressure   ? History of hiatal hernia   ? History of stomach ulcers   ? IBS (irritable bowel syndrome)   ? Joint pain   ? Sleep apnea   ? did use cpap-lost 25lb-says she does not need it   NO CPAP  ? Wears contact lenses   ? ?Past Surgical History:  ?Procedure Laterality Date  ? BRAIN SURGERY Left 11/07/2021  ? [facility] Dr. Herschel [place]  ? BREAST BIOPSY    ? BREAST EXCISIONAL BIOPSY    ? BREAST LUMPECTOMY WITH RADIOACTIVE SEED LOCALIZATION Bilateral 11/24/2014  ? Procedure: BILATERAL BREAST LUMPECTOMY WITH RADIOACTIVE SEED LOCALIZATION;  Surgeon: [name], MD;  Location: [place];  Service: General;  Laterality: Bilateral;  ? CHOLECYSTECTOMY    ? COLONOSCOPY    ? DILATION AND CURETTAGE OF UTERUS    ? ESOPHAGOGASTRODUODENOSCOPY (EGD) WITH PROPOFOL N/A 07/23/2016  ? Procedure: ESOPHAGOGASTRODUODENOSCOPY  (EGD) WITH PROPOFOL;  Surgeon: Laurence Spates, MD;  [facility];  Service: Endoscopy;  Laterality: N/A;  ? ESOPHAGOGASTRODUODENOSCOPY (EGD) WITH PROPOFOL N/A 06/11/2018  ? Procedure: ESOPHAGOGASTRODUODENOSCOPY (EGD) WITH PROPOFOL;  Surgeon: Laurence Spates, MD;  Location[facility];  Service: Endoscopy;  Laterality: N/A;  ? LUMBAR LAMINECTOMY  2010  ? ORIF WRIST FRACTURE Right 11/24/2020  ? Procedure: OPEN REDUCTION INTERNAL FIXATION RIGHT WRIST FRACTURE;  Surgeon: Renette Butters, MD;  Location: [facility];  Service: Orthopedics;  Laterality: Right;  ? UMBILICAL HERNIA REPAIR    ? age 37  ? UPPER GI ENDOSCOPY    ? ?Patient Active Problem List  ? Diagnosis Date Noted  ? Cerebellar tumor ([place]) 11/15/2021  ? Comorbid sleep-related hypoventilation 10/19/2021  ? Urinary incontinence/areflexic bladder 10/05/2021  ? OSA, unable to tolerate CPAP (vomited every night) 09/05/2021  ? Chronic pain syndrome 09/05/2021  ? Chronic nausea 09/05/2021  ? Neurogenic bladder, followed by Urology, instructed I&O cath BID 09/05/2021  ? Intolerance of continuous positive airway pressure (CPAP) ventilation 09/03/2021  ? Insomnia secondary to chronic pain 07/24/2021  ? Primary osteoarthritis of right knee 07/03/2021  ?  Myofascial pain dysfunction syndrome 07/03/2021  ? Fibromyalgia 07/03/2021  ? Hyponatremia 07/03/2021  ? Primary osteoarthritis of left knee 07/03/2021  ? Body mass index (BMI) 50.0-59.9, adult ([place]) 03/07/2021  ? Allergic rhinitis due to animal (cat) (dog) hair and dander 10/06/2020  ? Allergic rhinitis due to pollen 10/06/2020  ? Moderate persistent asthma, uncomplicated 24/40/1027  ? Cerebellar mass 10/06/2020  ? Paresthesia 05/27/2020  ? Diabetes mellitus ([place]) 05/25/2020  ? Hypertension associated with diabetes ([place]) 05/25/2020  ? Hyperlipidemia associated with type 2 diabetes mellitus ([place]) 05/25/2020  ? Vitamin D deficiency 05/25/2020  ? NAFLD (nonalcoholic fatty liver disease) 05/25/2020  ? Sciatica of left  side 05/25/2020  ? ? ?ONSET DATE: 11/07/21  ? ?REFERRING DIAG: cerebellar tumor, s/p craniotomy  ? ?THERAPY DIAG:  ?Other lack of coordination ? ?Muscle weakness (generalized) ? ?Unsteadiness on feet ? ?Other abnormalities of gait and mobility ? ? ?PERTINENT HISTORY: Virginia Luna is a 60 year old female who recently presented to hospitalized at [place] in [facility] complaining of headaches and tremors. 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 She was admitted 11/07/2021, underwent left suboccipital craniotomy, resection of cerebellar tumor. Postoperative MRI performed on 3/22 revealed a small area of diffusion restriction in the left cerebellum Pt was transferred to  [facility] 11/15/21 and  she was d/c home 11/24/21.PMH: arthritis, asthma, chronic pain, DM, fibromyalgia, myofascial pain syndrome, HTN, IBS, sleep apnea Brain ?  ?PRECAUTIONS: Fall, no driving ? ?SUBJECTIVE: I've had bad "shakes" the last few days ? ?PAIN:  ?Are you having pain? Yes: NPRS scale: 8/10 ?Pain location: head and neck, and legs (all over, generalized) ?Pain description: constant ?Aggravating factors: surgery, fibromyalgia, myofascial pain syndrome ?Relieving factors: meds ? ? ? ?OBJECTIVE:  ? ?TODAY'S TREATMENT:  ? ?Sitting, closed-chain chest press/elbow ext along top of table for AAROM and incr control with min cueing. ? ?In standing, functional reaching to place clothespins with 1-8lb resistance on vertical pole with each UE for incr activity tolerance, hand strength, and coordination with min-mod difficulty with control and 1 rest break. ? ?Sitting, placing various-sized pegs in arc pegboard with each UE with mod difficulty. ? ?Flipping cards with each hand with mod difficulty/incr time.   ? ?Picking up checkers and placing in bowl with min difficulty/incr time. ? ? ?PATIENT EDUCATION: ?Education details: n/a today ?Person educated:  ?Education method:  ?Education  comprehension:  ? ?HOME EXERCISE PROGRAM: ?putty HEP, coordination HEP, sh flex supine (12/06/21); BUE HEP (12/14/21) ? ? ?GOALS: ?  ?  ?SHORT TERM GOALS: Target date: 12/26/2021 ?  ?I with HEP ?  ?Goal status: ONGOING (issued 12/06/21) ?  ?2.  Pt will perform dressing mod I in a reasonable amount of time ?Baseline: supervision, increased time required ?Goal status: ONGOING ?  ?3.  Pt will perform basic home management with min A. ?Baseline: dependent ?Goal status: INITIAL ?  ?4.  Pt will perform simple cooking task with min a demonstrating good safety awareness. ?Baseline: dependent ?Goal status: INITIAL ?  ?5.  Pt will increase bilateral grip strength by 5 lbs for increased ease opening containers. ?Baseline RUE 21.1 lbs, LUE 14.9 lbs :  ?Goal status: INITIAL ?  ?6.  Pt will demonstrate ability to stand for functional/ ADL tasks x 15 mins without rest break and no LOB. ?Baseline: poor and inconsistent endurance ?Goal status: INITIAL ?  ?LONG TERM GOALS: Target date: 02/20/2022 ?  ?I with updated HEP. ?  ?Goal status:  INITIAL ?  ?2.  Pt will perform all basic ADLS mod I ?Baseline: supervision ?Goal status: INITIAL ?  ?3.  Pt will perform basic home management modified I ?Baseline: dependent ?Goal status: INITIAL ?  ?4.  Pt will perform basic cooking mod I demonstrating good safety awareness. ?Baseline: depdnent ?Goal status: INITIAL ?  ?5.  Pt will demonstrate improved fine motor coordination for ADLS as evidenced by decreasing 9 hole peg test score by 3 secs, bilaterally. ?Baseline: R 35.06, L 38.53 ?Goal status: INITIAL ?  ?6.  Pt will demonstrate ability to retrieve a 3 lbs weight from overhead shelf with left and right UE's individually demonstrating good control. ?Baseline: decreased bilateral strength and control. ?Goal status: INITIAL ?  ?ASSESSMENT: ?  ?CLINICAL IMPRESSION:  ?Pt is slowly progressing towards goals.  UE functional use remains impacted by ataxia.  Lethargy and pain are also barriers.  ?   ?PERFORMANCE DEFICITS in functional skills including ADLs, IADLs, coordination, dexterity, sensation, ROM, strength, pain, fascial restrictions, flexibility, FMC, GMC, mobility, balance, endurance, decreased knowledg

## 2021-12-19 ENCOUNTER — Encounter: Payer: Self-pay | Admitting: Occupational Therapy

## 2021-12-19 ENCOUNTER — Encounter: Payer: Self-pay | Admitting: Physical Therapy

## 2021-12-19 ENCOUNTER — Ambulatory Visit: Payer: Medicare HMO | Admitting: Occupational Therapy

## 2021-12-19 ENCOUNTER — Ambulatory Visit: Payer: Medicare HMO | Attending: Physician Assistant | Admitting: Physical Therapy

## 2021-12-19 ENCOUNTER — Ambulatory Visit: Payer: Medicare HMO | Admitting: Speech Pathology

## 2021-12-19 DIAGNOSIS — M6281 Muscle weakness (generalized): Secondary | ICD-10-CM | POA: Diagnosis not present

## 2021-12-19 DIAGNOSIS — R41841 Cognitive communication deficit: Secondary | ICD-10-CM | POA: Insufficient documentation

## 2021-12-19 DIAGNOSIS — R41840 Attention and concentration deficit: Secondary | ICD-10-CM | POA: Insufficient documentation

## 2021-12-19 DIAGNOSIS — R471 Dysarthria and anarthria: Secondary | ICD-10-CM | POA: Diagnosis not present

## 2021-12-19 DIAGNOSIS — R4701 Aphasia: Secondary | ICD-10-CM | POA: Diagnosis present

## 2021-12-19 DIAGNOSIS — R278 Other lack of coordination: Secondary | ICD-10-CM | POA: Diagnosis not present

## 2021-12-19 DIAGNOSIS — R208 Other disturbances of skin sensation: Secondary | ICD-10-CM | POA: Insufficient documentation

## 2021-12-19 DIAGNOSIS — R41844 Frontal lobe and executive function deficit: Secondary | ICD-10-CM | POA: Insufficient documentation

## 2021-12-19 DIAGNOSIS — R2689 Other abnormalities of gait and mobility: Secondary | ICD-10-CM

## 2021-12-19 DIAGNOSIS — R2681 Unsteadiness on feet: Secondary | ICD-10-CM

## 2021-12-19 NOTE — Patient Instructions (Signed)
Word Finding Strategies ?Describe: What do you do with it? (function) ?What color is it? ?Where would I find it? ?How do you feel about it? ?What size is it? ?What is it associated with? ?Who? What? When? Where? Why? ?Gesture: Sometimes pairing a gesture with verbal attempts helps elicit word production ?Draw a picture of it ?Try to spell the word/write it ?Circumlocute: Talk around the object without saying what the object is ?

## 2021-12-19 NOTE — Therapy (Signed)
?OUTPATIENT PHYSICAL THERAPY TREATMENT NOTE ? ? ?Patient Name: Virginia Luna ?MRN: [id number] ?DOB:November 08, 1960, 61 y.o., female ?Today's Date: 12/19/2021 ? ?PCP: Shirline Frees, MD ?REFERRING PROVIDER: Courtney Heys, MD   ? ?END OF SESSION:  ? PT End of Session - 12/19/21 0850   ? ? Visit Number 5   ? Number of Visits 13   ? Date for PT Re-Evaluation 02/23/22   ? Authorization Type [employer]   ? PT Start Time 0848   received from OT  ? PT Stop Time 0926   ? PT Time Calculation (min) 38 min   ? Equipment Utilized During Treatment Gait belt   ? Activity Tolerance Patient limited by fatigue;Patient tolerated treatment well   ? Behavior During Therapy [facility] for tasks assessed/performed   ? ?  ?  ? ?  ? ? ? ?Past Medical History:  ?Diagnosis Date  ? Allergies   ? Anemia   ? Arthritis   ? Asthma   ? Back pain   ? Chronic pain   ? Complication of anesthesia   ? woke up during colonoscopy  ? Diabetes ([place])   ? Diabetes mellitus without complication ([place])   ? Edema, lower extremity   ? Fibroid   ? Fibromyalgia   ? Chronic  ? GERD (gastroesophageal reflux disease)   ? Headache   ? migraines  ? High blood pressure   ? History of hiatal hernia   ? History of stomach ulcers   ? IBS (irritable bowel syndrome)   ? Joint pain   ? Sleep apnea   ? did use cpap-lost 25lb-says she does not need it   NO CPAP  ? Wears contact lenses   ? ?Past Surgical History:  ?Procedure Laterality Date  ? BRAIN SURGERY Left 11/07/2021  ? [facility] Dr. Herschel [place]  ? BREAST BIOPSY    ? BREAST EXCISIONAL BIOPSY    ? BREAST LUMPECTOMY WITH RADIOACTIVE SEED LOCALIZATION Bilateral 11/24/2014  ? Procedure: BILATERAL BREAST LUMPECTOMY WITH RADIOACTIVE SEED LOCALIZATION;  Surgeon: [name], MD;  Location: [place];  Service: General;  Laterality: Bilateral;  ? CHOLECYSTECTOMY    ? COLONOSCOPY    ? DILATION AND CURETTAGE OF UTERUS    ? ESOPHAGOGASTRODUODENOSCOPY (EGD) WITH PROPOFOL N/A 07/23/2016  ? Procedure:  ESOPHAGOGASTRODUODENOSCOPY (EGD) WITH PROPOFOL;  Surgeon: Laurence Spates, MD;  [facility];  Service: Endoscopy;  Laterality: N/A;  ? ESOPHAGOGASTRODUODENOSCOPY (EGD) WITH PROPOFOL N/A 06/11/2018  ? Procedure: ESOPHAGOGASTRODUODENOSCOPY (EGD) WITH PROPOFOL;  Surgeon: Laurence Spates, MD;  [facility];  Service: Endoscopy;  Laterality: N/A;  ? LUMBAR LAMINECTOMY  2010  ? ORIF WRIST FRACTURE Right 11/24/2020  ? Procedure: OPEN REDUCTION INTERNAL FIXATION RIGHT WRIST FRACTURE;  Surgeon: Renette Butters, MD;  [facility];  Service: Orthopedics;  Laterality: Right;  ? UMBILICAL HERNIA REPAIR    ? age 30  ? UPPER GI ENDOSCOPY    ? ?Patient Active Problem List  ? Diagnosis Date Noted  ? Cerebellar tumor ([place]) 11/15/2021  ? Comorbid sleep-related hypoventilation 10/19/2021  ? Urinary incontinence/areflexic bladder 10/05/2021  ? OSA, unable to tolerate CPAP (vomited every night) 09/05/2021  ? Chronic pain syndrome 09/05/2021  ? Chronic nausea 09/05/2021  ? Neurogenic bladder, followed by Urology, instructed I&O cath BID 09/05/2021  ? Intolerance of continuous positive airway pressure (CPAP) ventilation 09/03/2021  ? Insomnia secondary to chronic pain 07/24/2021  ? Primary osteoarthritis of right knee 07/03/2021  ? Myofascial pain dysfunction syndrome 07/03/2021  ?  Fibromyalgia 07/03/2021  ? Hyponatremia 07/03/2021  ? Primary osteoarthritis of left knee 07/03/2021  ? Body mass index (BMI) 50.0-59.9, adult ([place]) 03/07/2021  ? Allergic rhinitis due to animal (cat) (dog) hair and dander 10/06/2020  ? Allergic rhinitis due to pollen 10/06/2020  ? Moderate persistent asthma, uncomplicated 23/53/6144  ? Cerebellar mass 10/06/2020  ? Paresthesia 05/27/2020  ? Diabetes mellitus ([place]) 05/25/2020  ? Hypertension associated with diabetes ([place]) 05/25/2020  ? Hyperlipidemia associated with type 2 diabetes mellitus ([place]) 05/25/2020  ? Vitamin D deficiency 05/25/2020  ? NAFLD (nonalcoholic fatty liver disease)  05/25/2020  ? Sciatica of left side 05/25/2020  ? ? ?REFERRING DIAG: D49.6 (ICD-10-CM) - Neoplasm of unspecified behavior of brain   ? ?THERAPY DIAG:  ?Other lack of coordination ? ?Muscle weakness (generalized) ? ?Unsteadiness on feet ? ?Other abnormalities of gait and mobility ? ?PERTINENT HISTORY: From H&P note on 11/15/2021:  "On the day of admission 11/07/2021, the patient was taken to the operating room by Dr. Recardo Evangelist of neurosurgery and underwent left suboccipital craniotomy, resection of cerebellar tumor. She tolerated the procedure well. Postoperative MRI performed on 3/22 revealed a small area of diffusion restriction in the left cerebellum."  Patient transferred to [facility] on 11/15/2021, discharged 11/24/2021 per pt.  fibromyalgia, DM2, asthma, HTN, OSA, migraines, bilateral knee OA, myofascial pain syndrome ?  ? ?PRECAUTIONS: Fall ? ?SUBJECTIVE: Pt did not sleep well, she was shaky last night.  Has spoken to doctor about continued issues with shakiness.  She is not feeling herself today. ? ?PAIN:  ?She endorses pain from prior session. ?Are you having pain? Yes: NPRS scale: 8/10 ?Pain location: All over (fibromyalgia), worse in head and neck and BLE (LLE worse) ? ?OBJECTIVE:  ?  ?DIAGNOSTIC FINDINGS: From Brain MRI on 09/02/2021:  "Increased size of left superior cerebellar vermis mass, now ?measuring 1.5 x 1.5 cm, previously 1.1 x 1.0 cm. Increased mild surrounding edema."  Future brain MRI ordered w/o date visible at this time. ?  ?COGNITION: ?Overall cognitive status: No family/caregiver present to determine baseline cognitive functioning - pt states "I feel stupid", she elaborates that she is having a hard time processing. ?  ?  ?TODAY'S TREATMENT:  ?Ther Ex ?SciFit level 1.7 for 2 minutes + L1 2.0 x40mns using BLE/BUEs for dynamic cardiovascular warmup, endurance and BLE strength. Min cuing for full LE ROM, pt does well self-correcting intermittently after initial cuing. ? ?Gait ?230' + 230', RW, close  Supervision - CGA, mod verbal and tactile cues for upright trunk and relaxed shoulders to prevent UE compensation and flexed trunk-pt has hard time maintaining due to fatigue, pt requires rest following initial bout.  Further cuing on second bout for RW management and stepping into AD to improved posture and foot clearance. ? ?NMR ?Standing narrow semi-tandem EOM pt performs forward reach and tap to RW using 1.1kg weighted ball x10 each LE leading ?In // bars:  alt toe taps 4" step x20; alt step ups 4" step ups x10 ? ?Pt ambulates to restroom x65' at end of session CGA due to fatigue.   ?  ?PATIENT EDUCATION: ?Education details: Discussed modifying HEP to do a couple activities on good days and resting on worse days to promote compliance and maintenance of gains in therapy. ?Person educated: Patient ?Education method: Explanation  ?Education comprehension: verbalized understanding and needs further education ?  ?  ?HOME EXERCISE PROGRAM: ?Access Code: [email or web address] ?URL: [email or web address] ?Date: 12/06/2021 ?Prepared by: JMickie BailPlaster ? ?Exercises ?- Sit to  Stand Without Arm Support  - 1 x daily - 7 x weekly - 3 sets - 10 reps ?- Standing March with Counter Support  - 1 x daily - 7 x weekly - 3 sets - 10 reps ?- Chin Tuck  - 1 x daily - 7 x weekly - 3 sets - 10 reps ?- Seated Cervical Sidebending Stretch  - 1 x daily - 7 x weekly - 3 sets - 10 reps ? - Supine Bridge  - 1 x daily - 4 x weekly - 3 sets - 10 reps ?- Supine Active Straight Leg Raise  - 1 x daily - 4 x weekly - 2 sets - 8 reps ?- Forward and Backward Monster Walk with Counter Support  - 1 x daily - 4 x weekly - 3 sets - 10 reps ?- Seated Heel Raise  - 1 x daily - 4 x weekly - 2 sets - 12 reps ?  ?GOALS: ?Goals reviewed with patient? Yes ?  ?SHORT TERM GOALS: Target date:  01/12/2022 ?  ?Pt will be independent with initial strength and balance HEP with supervision from family. ?Baseline:  To be established. ?Goal status: INITIAL ?  ?2.  Pt  will decrease 5xSTS to <35 seconds w/ BUE support in order to demonstrate decreased risk for falls and improved functional bilateral LE strength and power. ?Baseline: 38.51sec w/ BUE ?Goal status: INITIAL ?  ?3.  Pt will demonstr

## 2021-12-19 NOTE — Therapy (Signed)
?OUTPATIENT SPEECH LANGUAGE PATHOLOGY TREATMENT NOTE ? ? ?Patient Name: Virginia Luna ?MRN: [id number] ?DOB:1961/08/11, 61 y.o., female ?Today's Date: 12/19/2021 ? ?PCP: Shirline Frees, MD ?REFERRING PROVIDER: Barbie Banner, PA-C  ? ?END OF SESSION:  ? End of Session - 12/19/21 0910   ? ? Visit Number 4   ? Number of Visits 25   ? Date for SLP Re-Evaluation 02/20/22   ? Authorization Type [employer]   ? Progress Note Due on Visit 10   ? SLP Start Time [phone]   ? SLP Stop Time  1010   ? SLP Time Calculation (min) 47 min   ? Activity Tolerance Patient limited by fatigue;Patient tolerated treatment well   ? ?  ?  ? ?  ? ? ? ?Past Medical History:  ?Diagnosis Date  ? Allergies   ? Anemia   ? Arthritis   ? Asthma   ? Back pain   ? Chronic pain   ? Complication of anesthesia   ? woke up during colonoscopy  ? Diabetes ([place])   ? Diabetes mellitus without complication ([place])   ? Edema, lower extremity   ? Fibroid   ? Fibromyalgia   ? Chronic  ? GERD (gastroesophageal reflux disease)   ? Headache   ? migraines  ? High blood pressure   ? History of hiatal hernia   ? History of stomach ulcers   ? IBS (irritable bowel syndrome)   ? Joint pain   ? Sleep apnea   ? did use cpap-lost 25lb-says she does not need it   NO CPAP  ? Wears contact lenses   ? ?Past Surgical History:  ?Procedure Laterality Date  ? BRAIN SURGERY Left 11/07/2021  ? [facility] Dr. [facility]chel [place]  ? BREAST BIOPSY    ? BREAST EXCISIONAL BIOPSY    ? BREAST LUMPECTOMY WITH RADIOACTIVE SEED LOCALIZATION Bilateral 11/24/2014  ? Procedure: BILATERAL BREAST LUMPECTOMY WITH RADIOACTIVE SEED LOCALIZATION;  Surgeon: [name], MD;  Location: [facility];  Service: General;  Laterality: Bilateral;  ? CHOLECYSTECTOMY    ? COLONOSCOPY    ? DILATION AND CURETTAGE OF UTERUS    ? ESOPHAGOGASTRODUODENOSCOPY (EGD) WITH PROPOFOL N/A 07/23/2016  ? Procedure: ESOPHAGOGASTRODUODENOSCOPY (EGD) WITH PROPOFOL;  Surgeon: Laurence Spates, MD;  [facility];   Service: Endoscopy;  Laterality: N/A;  ? ESOPHAGOGASTRODUODENOSCOPY (EGD) WITH PROPOFOL N/A 06/11/2018  ? Procedure: ESOPHAGOGASTRODUODENOSCOPY (EGD) WITH PROPOFOL;  Surgeon: Laurence Spates, MD;  Location: [facility];  Service: Endoscopy;  Laterality: N/A;  ? LUMBAR LAMINECTOMY  2010  ? ORIF WRIST FRACTURE Right 11/24/2020  ? Procedure: OPEN REDUCTION INTERNAL FIXATION RIGHT WRIST FRACTURE;  Surgeon: Renette Butters, MD;  Location: [facility];  Service: Orthopedics;  Laterality: Right;  ? UMBILICAL HERNIA REPAIR    ? age 61  ? UPPER GI ENDOSCOPY    ? ?Patient Active Problem List  ? Diagnosis Date Noted  ? Cerebellar tumor ([place]) 11/15/2021  ? Comorbid sleep-related hypoventilation 10/19/2021  ? Urinary incontinence/areflexic bladder 10/05/2021  ? OSA, unable to tolerate CPAP (vomited every night) 09/05/2021  ? Chronic pain syndrome 09/05/2021  ? Chronic nausea 09/05/2021  ? Neurogenic bladder, followed by Urology, instructed I&O cath BID 09/05/2021  ? Intolerance of continuous positive airway pressure (CPAP) ventilation 09/03/2021  ? Insomnia secondary to chronic pain 07/24/2021  ? Primary osteoarthritis of right knee 07/03/2021  ? Myofascial pain dysfunction syndrome 07/03/2021  ? Fibromyalgia 07/03/2021  ? Hyponatremia 07/03/2021  ? Primary osteoarthritis of left knee 07/03/2021  ?  Body mass index (BMI) 50.0-59.9, adult ([place]) 03/07/2021  ? Allergic rhinitis due to animal (cat) (dog) hair and dander 10/06/2020  ? Allergic rhinitis due to pollen 10/06/2020  ? Moderate persistent asthma, uncomplicated 50/04/3817  ? Cerebellar mass 10/06/2020  ? Paresthesia 05/27/2020  ? Diabetes mellitus ([place]) 05/25/2020  ? Hypertension associated with diabetes ([place]) 05/25/2020  ? Hyperlipidemia associated with type 2 diabetes mellitus ([place]) 05/25/2020  ? Vitamin D deficiency 05/25/2020  ? NAFLD (nonalcoholic fatty liver disease) 05/25/2020  ? Sciatica of left side 05/25/2020  ? ? ?ONSET DATE: March 2023 (surgery  11/07/2021) ? ?REFERRING DIAG: D49.6 (ICD-10-CM) - Cerebellar tumor ([place])  ? ?THERAPY DIAG:  ?Cognitive communication deficit ? ?SUBJECTIVE: "I went to the neurologist and she said she'd talk to the other doctors" re: being shaky ? ?PAIN:  ?Are you having pain? Yes ?NPRS scale: 8/10 ?Pain location: "all over" ? ?OBJECTIVE:  ? ?TODAY'S TREATMENT: ?SKILLED INTERVENTIONS:  ?12-19-21: Target anomia strategies and compensations d/t pt c/o difficulty with verbal expression. Pt unable to provide additional information regarding these episodes, based on ST observations suspect cognitive component to impaired verbal expression. Provide education on word finding strategies. Led pt through [facility] for x3 personally relevant objects. Pt requires occasional mod A to complete. Benefits from examples, guiding questions, rare phonemic cues. Tells ST "this was hard, I had to close my eyes to think." Addressed prospective memory by training pt to use reminder when generating calendar appointment.  ? ?12-12-21: Completed CLQT this date. Low WNL for domains of attention, ex functions, visuospatial skills. Overall severity rating of mild. Provide education on internal vs. external memory strategies to include use of device, lists, calendar/planner, association, repetition, and creating a story. Generated list with pt of information she would like to be able to recall. Use of association during structured task, pt with 100% accuracy recalling paired items with mod-A faded to mod-I as task progressed.  ? ?12-06-21: Pt reports to using computer and paper planner to manage appointments. Discussed reasoning why pt is using both and encouraged her to continue to promote IND in orientation and managing schedule. ST provides education on establishing routine for taking meds, encouraged pt to think about best time to take. Pt reports to planning to move home this week. Encouraged pt to try managing meds IND this week with god-daughter supervising to  ensure she is prepared to do it w/o her assistance once she moves. Initiated CLQT this date. Pt demonstrates poor frustration tolerance throughout tasks, becoming easily discouraged. ST provides reassurance of nature of assessment just to gain information. Pt scores moderate for memory and mild for language. Other domains not yet complete. ? ?PATIENT EDUCATION: ?Education details: see above ?Person educated: Patient ?Education method: Explanation, Demonstration, and Handouts ?Education comprehension: verbalized understanding, returned demonstration, and needs further education ?  ?  ?GOALS: ?Goals reviewed with patient? Yes ?  ?SHORT TERM GOALS: Target date: 12/27/2021 ?  ?Pt will complete standardized cognitive assessment to better appreciate reported cognitive deficits within first 2 sessions.  ?Baseline: 12-06-21, 12-12-21 ?Goal status: goal met ?  ?2.  Pt will verbalize and implement 2 attention and memory compensations to aid daily functioning given occasional min A over 2 sessions ?Baseline: 12-19-21; ?Goal status: ongoing ?  ?3.  Pt will demonstrate successful usage of anomia compensations, with occasional min-A, to make 5 minute mod complex conversation functional over 2 sessions ?Baseline:  ?Goal status: ongoing ?  ?4.  Pt will demonstrate dysarthria compensations on structured speech tasks with  80% accuracy given occasional min A over 2 sessions ?Baseline:  ?Goal status: ongoing ?  ?  ?LONG TERM GOALS: Target date: 02/21/2022 ?  ?Pt will verbalize x3 functional areas in which implementation of attention/memory compensations/strategies have resulted in increased successful participation in desired activities.  ?Baseline:  ?Goal status: ongoing ?  ?2.  Pt will report completion of dysarthria HEP at least 1x/day (rec BID) over 1 week period with rare min-A.  ?Baseline:  ?Goal status: ongoing ?  ?3.  Pt will report improvement on CPIB by 3 points by d/c.  ?Baseline:  ?Goal status: ongoing ?  ?4.  Pt will utilize  anomia compensations when word finding occurs in 20+ minute conversation with rare min A ?Baseline:  ?Goal status: ongoing ?  ?  ?ASSESSMENT: ?  ?CLINICAL IMPRESSION: ?Patient is a 61 y.o. F who was seen today for cognitive communica

## 2021-12-20 NOTE — Therapy (Signed)
?OUTPATIENT SPEECH LANGUAGE PATHOLOGY TREATMENT NOTE ? ? ?Patient Name: Virginia Luna ?MRN: [id number] ?DOB:11/08/1960, 61 y.o., female ?Today's Date: 12/21/2021 ? ?PCP: Shirline Frees, MD ?REFERRING PROVIDER: Barbie Banner, PA-C  ? ?END OF SESSION:  ? End of Session - 12/21/21 0933   ? ? Visit Number 5   ? Number of Visits 25   ? Date for SLP Re-Evaluation 02/20/22   ? Authorization Type [employer]   ? Progress Note Due on Visit 10   ? SLP Start Time [phone]   ? SLP Stop Time  1015   ? SLP Time Calculation (min) 42 min   ? Activity Tolerance Patient limited by fatigue;Patient tolerated treatment well;Patient limited by pain   ? ?  ?  ? ?  ? ? ? ? ?Past Medical History:  ?Diagnosis Date  ? Allergies   ? Anemia   ? Arthritis   ? Asthma   ? Back pain   ? Chronic pain   ? Complication of anesthesia   ? woke up during colonoscopy  ? Diabetes ([place])   ? Diabetes mellitus without complication ([place])   ? Edema, lower extremity   ? Fibroid   ? Fibromyalgia   ? Chronic  ? GERD (gastroesophageal reflux disease)   ? Headache   ? migraines  ? High blood pressure   ? History of hiatal hernia   ? History of stomach ulcers   ? IBS (irritable bowel syndrome)   ? Joint pain   ? Sleep apnea   ? did use cpap-lost 25lb-says she does not need it   NO CPAP  ? Wears contact lenses   ? ?Past Surgical History:  ?Procedure Laterality Date  ? BRAIN SURGERY Left 11/07/2021  ? [facility] [place]  ? BREAST BIOPSY    ? BREAST EXCISIONAL BIOPSY    ? BREAST LUMPECTOMY WITH RADIOACTIVE SEED LOCALIZATION Bilateral 11/24/2014  ? Procedure: BILATERAL BREAST LUMPECTOMY WITH RADIOACTIVE SEED LOCALIZATION;  Surgeon: [name], MD;  Location: [facility];  Service: General;  Laterality: Bilateral;  ? CHOLECYSTECTOMY    ? COLONOSCOPY    ? DILATION AND CURETTAGE OF UTERUS    ? ESOPHAGOGASTRODUODENOSCOPY (EGD) WITH PROPOFOL N/A 07/23/2016  ? Procedure: ESOPHAGOGASTRODUODENOSCOPY (EGD) WITH PROPOFOL;  Surgeon: Laurence Spates, MD;   [facility];  Service: Endoscopy;  Laterality: N/A;  ? ESOPHAGOGASTRODUODENOSCOPY (EGD) WITH PROPOFOL N/A 06/11/2018  ? Procedure: ESOPHAGOGASTRODUODENOSCOPY (EGD) WITH PROPOFOL;  Surgeon: Laurence Spates, MD;  [facility];  Service: Endoscopy;  Laterality: N/A;  ? LUMBAR LAMINECTOMY  2010  ? ORIF WRIST FRACTURE Right 11/24/2020  ? Procedure: OPEN REDUCTION INTERNAL FIXATION RIGHT WRIST FRACTURE;  Surgeon: Renette Butters, MD;  [facility];  Service: Orthopedics;  Laterality: Right;  ? UMBILICAL HERNIA REPAIR    ? age 20  ? UPPER GI ENDOSCOPY    ? ?Patient Active Problem List  ? Diagnosis Date Noted  ? Cerebellar tumor ([place]) 11/15/2021  ? Comorbid sleep-related hypoventilation 10/19/2021  ? Urinary incontinence/areflexic bladder 10/05/2021  ? OSA, unable to tolerate CPAP (vomited every night) 09/05/2021  ? Chronic pain syndrome 09/05/2021  ? Chronic nausea 09/05/2021  ? Neurogenic bladder, followed by Urology, instructed I&O cath BID 09/05/2021  ? Intolerance of continuous positive airway pressure (CPAP) ventilation 09/03/2021  ? Insomnia secondary to chronic pain 07/24/2021  ? Primary osteoarthritis of right knee 07/03/2021  ? Myofascial pain dysfunction syndrome 07/03/2021  ? Fibromyalgia 07/03/2021  ? Hyponatremia 07/03/2021  ? Primary osteoarthritis  of left knee 07/03/2021  ? Body mass index (BMI) 50.0-59.9, adult ([place]) 03/07/2021  ? Allergic rhinitis due to animal (cat) (dog) hair and dander 10/06/2020  ? Allergic rhinitis due to pollen 10/06/2020  ? Moderate persistent asthma, uncomplicated 16/08/930  ? Cerebellar mass 10/06/2020  ? Paresthesia 05/27/2020  ? Diabetes mellitus ([place]) 05/25/2020  ? Hypertension associated with diabetes ([place]) 05/25/2020  ? Hyperlipidemia associated with type 2 diabetes mellitus ([place]) 05/25/2020  ? Vitamin D deficiency 05/25/2020  ? NAFLD (nonalcoholic fatty liver disease) 05/25/2020  ? Sciatica of left side 05/25/2020  ? ? ?ONSET DATE: March 2023  (surgery 11/07/2021) ? ?REFERRING DIAG: D49.6 (ICD-10-CM) - Cerebellar tumor ([place])  ? ?THERAPY DIAG:  ?Cognitive communication deficit ? ?Aphasia ? ?Dysarthria and anarthria ? ?SUBJECTIVE: "I see my doctor soon" ? ?PAIN:  ?Are you having pain? Yes ?NPRS scale: 8/10 ?Pain location: "everywhere" ? ?OBJECTIVE:  ? ?TODAY'S TREATMENT: ?SKILLED INTERVENTIONS:  ?12-21-21: ST led pt through narrative discourse training, using NARNIA protocol. With this outline, pt able to tell x3 personal stories with increased details and clarity of narrative. Prior to initiation, given narrative prompt pt with vague response lacking content or important details relevant for comprehension of novel listener. Discussed  Pt reports continued successful implementation of calendar system to manage her schedule. She has created a routine of referring to planner/calendar daily to orient to days activities. Pt able to use calendar on phone to relay dates of x2 upcoming doctors appointments date/time with mod-I. Led pt through cognitive exercises regarding schedule management. 100% accuracy answering time/planning related questions with rare min-A. ST recommends pt ask for information from Drs visits in writing or taking notes. Discussion on ability to use mychart to view information she may have forgotten.  ? ?12-19-21: Target anomia strategies and compensations d/t pt c/o difficulty with verbal expression. Pt unable to provide additional information regarding these episodes, based on ST observations suspect cognitive component to impaired verbal expression. Provide education on word finding strategies. Led pt through [facility] for x3 personally relevant objects. Pt requires occasional mod A to complete. Benefits from examples, guiding questions, rare phonemic cues. Tells ST "this was hard, I had to close my eyes to think." Addressed prospective memory by training pt to use reminder when generating calendar appointment.  ? ?12-12-21: Completed CLQT this date.  Low WNL for domains of attention, ex functions, visuospatial skills. Overall severity rating of mild. Provide education on internal vs. external memory strategies to include use of device, lists, calendar/planner, association, repetition, and creating a story. Generated list with pt of information she would like to be able to recall. Use of association during structured task, pt with 100% accuracy recalling paired items with mod-A faded to mod-I as task progressed.  ? ?12-06-21: Pt reports to using computer and paper planner to manage appointments. Discussed reasoning why pt is using both and encouraged her to continue to promote IND in orientation and managing schedule. ST provides education on establishing routine for taking meds, encouraged pt to think about best time to take. Pt reports to planning to move home this week. Encouraged pt to try managing meds IND this week with god-daughter supervising to ensure she is prepared to do it w/o her assistance once she moves. Initiated CLQT this date. Pt demonstrates poor frustration tolerance throughout tasks, becoming easily discouraged. ST provides reassurance of nature of assessment just to gain information. Pt scores moderate for memory and mild for language. Other domains not yet complete. ? ?PATIENT EDUCATION: ?Education  details: see above ?Person educated: Patient ?Education method: Explanation, Demonstration, and Handouts ?Education comprehension: verbalized understanding, returned demonstration, and needs further education ?  ?  ?GOALS: ?Goals reviewed with patient? Yes ?  ?SHORT TERM GOALS: Target date: 12/27/2021 ?  ?Pt will complete standardized cognitive assessment to better appreciate reported cognitive deficits within first 2 sessions.  ?Baseline: 12-06-21, 12-12-21 ?Goal status: goal met ?  ?2.  Pt will verbalize and implement 2 attention and memory compensations to aid daily functioning given occasional min A over 2 sessions ?Baseline: 12-19-21; ?Goal  status: ongoing ?  ?3.  Pt will demonstrate successful usage of anomia compensations, with occasional min-A, to make 5 minute mod complex conversation functional over 2 sessions ?Baseline: 12-21-21 ?Goal status: ong

## 2021-12-21 ENCOUNTER — Ambulatory Visit: Payer: Medicare HMO | Admitting: Speech Pathology

## 2021-12-21 ENCOUNTER — Ambulatory Visit: Payer: Medicare HMO | Admitting: Occupational Therapy

## 2021-12-21 ENCOUNTER — Encounter: Payer: Self-pay | Admitting: Occupational Therapy

## 2021-12-21 DIAGNOSIS — R2681 Unsteadiness on feet: Secondary | ICD-10-CM

## 2021-12-21 DIAGNOSIS — R471 Dysarthria and anarthria: Secondary | ICD-10-CM | POA: Diagnosis not present

## 2021-12-21 DIAGNOSIS — R278 Other lack of coordination: Secondary | ICD-10-CM | POA: Diagnosis not present

## 2021-12-21 DIAGNOSIS — R41841 Cognitive communication deficit: Secondary | ICD-10-CM

## 2021-12-21 DIAGNOSIS — R41844 Frontal lobe and executive function deficit: Secondary | ICD-10-CM | POA: Diagnosis not present

## 2021-12-21 DIAGNOSIS — J301 Allergic rhinitis due to pollen: Secondary | ICD-10-CM | POA: Diagnosis not present

## 2021-12-21 DIAGNOSIS — R41840 Attention and concentration deficit: Secondary | ICD-10-CM | POA: Diagnosis not present

## 2021-12-21 DIAGNOSIS — R4701 Aphasia: Secondary | ICD-10-CM

## 2021-12-21 DIAGNOSIS — R2689 Other abnormalities of gait and mobility: Secondary | ICD-10-CM

## 2021-12-21 DIAGNOSIS — J3089 Other allergic rhinitis: Secondary | ICD-10-CM | POA: Diagnosis not present

## 2021-12-21 DIAGNOSIS — M6281 Muscle weakness (generalized): Secondary | ICD-10-CM | POA: Diagnosis not present

## 2021-12-21 DIAGNOSIS — R208 Other disturbances of skin sensation: Secondary | ICD-10-CM | POA: Diagnosis not present

## 2021-12-21 NOTE — Therapy (Signed)
?OUTPATIENT OCCUPATIONAL THERAPY TREATMENT NOTE ? ? ?Patient Name: Virginia Luna ?MRN: [id number] ?DOB:1961/07/21, 61 y.o., female ?Today's Date: 12/21/2021 ? ?PCP: Shirline Frees, MD ?REFERRING PROVIDER: Dr.Lovorn/ Risa Grill PA-C  ? ?END OF SESSION:  ? OT End of Session - 12/21/21 0857   ? ? Visit Number 6   ? Number of Visits 25   ? Date for OT Re-Evaluation 02/20/22   ? Authorization Type [employer]   ? Authorization - Visit Number 6   ? Authorization - Number of Visits 10   ? Progress Note Due on Visit 10   ? OT Start Time [phone]   ? OT Stop Time 0845   ? OT Time Calculation (min) 42 min   ? Activity Tolerance Patient limited by pain   ? Behavior During Therapy Flat affect   ? ?  ?  ? ?  ? ? ? ? ?Past Medical History:  ?Diagnosis Date  ? Allergies   ? Anemia   ? Arthritis   ? Asthma   ? Back pain   ? Chronic pain   ? Complication of anesthesia   ? woke up during colonoscopy  ? Diabetes ([place])   ? Diabetes mellitus without complication ([place])   ? Edema, lower extremity   ? Fibroid   ? Fibromyalgia   ? Chronic  ? GERD (gastroesophageal reflux disease)   ? Headache   ? migraines  ? High blood pressure   ? History of hiatal hernia   ? History of stomach ulcers   ? IBS (irritable bowel syndrome)   ? Joint pain   ? Sleep apnea   ? did use cpap-lost 25lb-says she does not need it   NO CPAP  ? Wears contact lenses   ? ?Past Surgical History:  ?Procedure Laterality Date  ? BRAIN SURGERY Left 11/07/2021  ? [facility] Dr. Herschel [place]  ? BREAST BIOPSY    ? BREAST EXCISIONAL BIOPSY    ? BREAST LUMPECTOMY WITH RADIOACTIVE SEED LOCALIZATION Bilateral 11/24/2014  ? Procedure: BILATERAL BREAST LUMPECTOMY WITH RADIOACTIVE SEED LOCALIZATION;  Surgeon: [name], MD;  Location: [place];  Service: General;  Laterality: Bilateral;  ? CHOLECYSTECTOMY    ? COLONOSCOPY    ? DILATION AND CURETTAGE OF UTERUS    ? ESOPHAGOGASTRODUODENOSCOPY (EGD) WITH PROPOFOL N/A 07/23/2016  ? Procedure:  ESOPHAGOGASTRODUODENOSCOPY (EGD) WITH PROPOFOL;  Surgeon: Laurence Spates, MD;  [facility];  Service: Endoscopy;  Laterality: N/A;  ? ESOPHAGOGASTRODUODENOSCOPY (EGD) WITH PROPOFOL N/A 06/11/2018  ? Procedure: ESOPHAGOGASTRODUODENOSCOPY (EGD) WITH PROPOFOL;  Surgeon: Laurence Spates, MD;  [facility];  Service: Endoscopy;  Laterality: N/A;  ? LUMBAR LAMINECTOMY  2010  ? ORIF WRIST FRACTURE Right 11/24/2020  ? Procedure: OPEN REDUCTION INTERNAL FIXATION RIGHT WRIST FRACTURE;  Surgeon: Renette Butters, MD;  Location: [facility];  Service: Orthopedics;  Laterality: Right;  ? UMBILICAL HERNIA REPAIR    ? age 74  ? UPPER GI ENDOSCOPY    ? ?Patient Active Problem List  ? Diagnosis Date Noted  ? Cerebellar tumor ([place]) 11/15/2021  ? Comorbid sleep-related hypoventilation 10/19/2021  ? Urinary incontinence/areflexic bladder 10/05/2021  ? OSA, unable to tolerate CPAP (vomited every night) 09/05/2021  ? Chronic pain syndrome 09/05/2021  ? Chronic nausea 09/05/2021  ? Neurogenic bladder, followed by Urology, instructed I&O cath BID 09/05/2021  ? Intolerance of continuous positive airway pressure (CPAP) ventilation 09/03/2021  ? Insomnia secondary to chronic pain 07/24/2021  ? Primary osteoarthritis of right knee 07/03/2021  ?  Myofascial pain dysfunction syndrome 07/03/2021  ? Fibromyalgia 07/03/2021  ? Hyponatremia 07/03/2021  ? Primary osteoarthritis of left knee 07/03/2021  ? Body mass index (BMI) 50.0-59.9, adult ([place]) 03/07/2021  ? Allergic rhinitis due to animal (cat) (dog) hair and dander 10/06/2020  ? Allergic rhinitis due to pollen 10/06/2020  ? Moderate persistent asthma, uncomplicated 54/00/8676  ? Cerebellar mass 10/06/2020  ? Paresthesia 05/27/2020  ? Diabetes mellitus ([place]) 05/25/2020  ? Hypertension associated with diabetes ([place]) 05/25/2020  ? Hyperlipidemia associated with type 2 diabetes mellitus ([place]) 05/25/2020  ? Vitamin D deficiency 05/25/2020  ? NAFLD (nonalcoholic fatty liver disease)  05/25/2020  ? Sciatica of left side 05/25/2020  ? ? ?ONSET DATE: 11/07/21  ? ?REFERRING DIAG: cerebellar tumor, s/p craniotomy  ? ?THERAPY DIAG:  ?Other lack of coordination ? ?Muscle weakness (generalized) ? ?Other abnormalities of gait and mobility ? ?Frontal lobe and executive function deficit ? ?Attention and concentration deficit ? ?Other disturbances of skin sensation ? ? ?PERTINENT HISTORY: Virginia Luna is a 61 year old female who recently presented to hospitalized at [facility] complaining of headaches and tremors. 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 She was admitted 11/07/2021, underwent left suboccipital craniotomy, resection of cerebellar tumor. Postoperative MRI performed on 3/22 revealed a small area of diffusion restriction in the left cerebellum Pt was transferred to  [facility] 11/15/21 and  she was d/c home 11/24/21.PMH: arthritis, asthma, chronic pain, DM, fibromyalgia, myofascial pain syndrome, HTN, IBS, sleep apnea Brain ?  ?PRECAUTIONS: Fall, no driving ? ?SUBJECTIVE: I've had bad "shakes" the last few days ? ?PAIN:  ?Are you having pain? Yes: NPRS scale: 8/10 ?Pain location: head and neck, and legs (all over, generalized) ?Pain description: constant ?Aggravating factors: surgery, fibromyalgia, myofascial pain syndrome ?Relieving factors: meds ? ? ? ?OBJECTIVE:  ? ?TODAY'S TREATMENT:  ? ?Standing rocking forwards and backwards for weightbearing through bilateral UE's ? ?Simulated eating, with v.c for compensation for tremors, foam grip utilized and pt reports increased ease with feeding, foam grips issued for home. ? ?Folding towels in standing, pt folded 2 towels prior to rest, then pt sat to fold 5 more items with increased time, pt was limited by pain today. ? ?Sitting, placing various-sized pegs in arc pegboard with each UE with min-mod difficulty, min v.c for compensation for tremors. ? ? ? ?PATIENT  EDUCATION: ?Education details: use of foam grips for feeding and beginning compensation for tremors/ ataxia ?Person educated: pt. ?Education method: explanation, demo, verbal cues ?Education comprehension: returned demo. With min v.c ? ?HOME EXERCISE PROGRAM: ?putty HEP, coordination HEP, sh flex supine (12/06/21); BUE HEP (12/14/21) ? ? ?GOALS: ?  ?  ?SHORT TERM GOALS: Target date: 12/26/2021 ?  ?I with HEP ?  ?Goal status: ONGOING (issued 12/06/21) ?  ?2.  Pt will perform dressing mod I in a reasonable amount of time ?Baseline: supervision, increased time required ?Goal status: ONGOING ?  ?3.  Pt will perform basic home management with min A. ?Baseline: dependent ?Goal status: ongoing, pt reports washing dishes standing at sink, however pt has not attempted other activities ?  ?4.  Pt will perform simple cooking task with min a demonstrating good safety awareness. ?Baseline: dependent ?Goal status:  ongoing, has not attempted ?  ?5.  Pt will increase bilateral grip strength by 5 lbs for increased ease opening containers. ?Baseline RUE 21.1 lbs, LUE 14.9 lbs :  ?Goal status: goal met RUE: 35.4  LUE 37.4 ?  ?6.  Pt will demonstrate ability to stand for functional/ ADL tasks x 15 mins without rest break and no LOB. ?Baseline: poor and inconsistent endurance ?Goal status:  ongoing  stands  for 5 mins  for washing dishes ?  ?LONG TERM GOALS: Target date: 02/20/2022 ?  ?I with updated HEP. ?  ?Goal status: INITIAL ?  ?2.  Pt will perform all basic ADLS mod I ?Baseline: supervision ?Goal status: INITIAL ?  ?3.  Pt will perform basic home management modified I ?Baseline: dependent ?Goal status: INITIAL ?  ?4.  Pt will perform basic cooking mod I demonstrating good safety awareness. ?Baseline: depdnent ?Goal status: INITIAL ?  ?5.  Pt will demonstrate improved fine motor coordination for ADLS as evidenced by decreasing 9 hole peg test score by 3 secs, bilaterally. ?Baseline: R 35.06, L 38.53 ?Goal status: INITIAL ?  ?6.  Pt will  demonstrate ability to retrieve a 3 lbs weight from overhead shelf with left and right UE's individually demonstrating good control. ?Baseline: decreased bilateral strength and control. ?Goal status: INITIAL ?  ?ASSESSMENT: ?  ?CLINICAL I

## 2021-12-26 ENCOUNTER — Ambulatory Visit: Payer: Medicare HMO | Admitting: Speech Pathology

## 2021-12-26 ENCOUNTER — Ambulatory Visit: Payer: Medicare HMO | Admitting: Occupational Therapy

## 2021-12-26 ENCOUNTER — Encounter: Payer: Self-pay | Admitting: Physical Therapy

## 2021-12-26 ENCOUNTER — Ambulatory Visit: Payer: Medicare HMO | Admitting: Physical Therapy

## 2021-12-26 DIAGNOSIS — R41844 Frontal lobe and executive function deficit: Secondary | ICD-10-CM | POA: Diagnosis not present

## 2021-12-26 DIAGNOSIS — R471 Dysarthria and anarthria: Secondary | ICD-10-CM | POA: Diagnosis not present

## 2021-12-26 DIAGNOSIS — R278 Other lack of coordination: Secondary | ICD-10-CM

## 2021-12-26 DIAGNOSIS — M6281 Muscle weakness (generalized): Secondary | ICD-10-CM

## 2021-12-26 DIAGNOSIS — R2689 Other abnormalities of gait and mobility: Secondary | ICD-10-CM

## 2021-12-26 DIAGNOSIS — K219 Gastro-esophageal reflux disease without esophagitis: Secondary | ICD-10-CM | POA: Diagnosis not present

## 2021-12-26 DIAGNOSIS — R2681 Unsteadiness on feet: Secondary | ICD-10-CM | POA: Diagnosis not present

## 2021-12-26 DIAGNOSIS — R4701 Aphasia: Secondary | ICD-10-CM

## 2021-12-26 DIAGNOSIS — N3281 Overactive bladder: Secondary | ICD-10-CM | POA: Diagnosis not present

## 2021-12-26 DIAGNOSIS — G43909 Migraine, unspecified, not intractable, without status migrainosus: Secondary | ICD-10-CM | POA: Diagnosis not present

## 2021-12-26 DIAGNOSIS — R41841 Cognitive communication deficit: Secondary | ICD-10-CM | POA: Diagnosis not present

## 2021-12-26 DIAGNOSIS — R41840 Attention and concentration deficit: Secondary | ICD-10-CM

## 2021-12-26 DIAGNOSIS — I10 Essential (primary) hypertension: Secondary | ICD-10-CM | POA: Diagnosis not present

## 2021-12-26 DIAGNOSIS — R208 Other disturbances of skin sensation: Secondary | ICD-10-CM | POA: Diagnosis not present

## 2021-12-26 DIAGNOSIS — F5101 Primary insomnia: Secondary | ICD-10-CM | POA: Diagnosis not present

## 2021-12-26 DIAGNOSIS — Z86018 Personal history of other benign neoplasm: Secondary | ICD-10-CM | POA: Diagnosis not present

## 2021-12-26 DIAGNOSIS — E119 Type 2 diabetes mellitus without complications: Secondary | ICD-10-CM | POA: Diagnosis not present

## 2021-12-26 DIAGNOSIS — R6889 Other general symptoms and signs: Secondary | ICD-10-CM | POA: Diagnosis not present

## 2021-12-26 DIAGNOSIS — J4520 Mild intermittent asthma, uncomplicated: Secondary | ICD-10-CM | POA: Diagnosis not present

## 2021-12-26 DIAGNOSIS — M797 Fibromyalgia: Secondary | ICD-10-CM | POA: Diagnosis not present

## 2021-12-26 NOTE — Therapy (Signed)
?OUTPATIENT PHYSICAL THERAPY TREATMENT NOTE ? ? ?Patient Name: Virginia Luna ?MRN: [id number] ?DOB:1960-12-08, 61 y.o., female ?Today's Date: 12/26/2021 ? ?PCP: Shirline Frees, MD ?REFERRING PROVIDER: Courtney Heys, MD   ? ?END OF SESSION:  ? PT End of Session - 12/26/21 0939   ? ? Visit Number 6   ? Number of Visits 13   ? Date for PT Re-Evaluation 02/23/22   ? Authorization Type [employer] HMO   ? PT Start Time 7619   pt in restroom at onset of session  ? PT Stop Time 1013   ? PT Time Calculation (min) 38 min   ? Equipment Utilized During Treatment Gait belt   ? Activity Tolerance Patient limited by fatigue;Patient tolerated treatment well   ? Behavior During Therapy [facility] for tasks assessed/performed   ? ?  ?  ? ?  ? ? ? ?Past Medical History:  ?Diagnosis Date  ? Allergies   ? Anemia   ? Arthritis   ? Asthma   ? Back pain   ? Chronic pain   ? Complication of anesthesia   ? woke up during colonoscopy  ? Diabetes ([place])   ? Diabetes mellitus without complication ([place])   ? Edema, lower extremity   ? Fibroid   ? Fibromyalgia   ? Chronic  ? GERD (gastroesophageal reflux disease)   ? Headache   ? migraines  ? High blood pressure   ? History of hiatal hernia   ? History of stomach ulcers   ? IBS (irritable bowel syndrome)   ? Joint pain   ? Sleep apnea   ? did use cpap-lost 25lb-says she does not need it   NO CPAP  ? Wears contact lenses   ? ?Past Surgical History:  ?Procedure Laterality Date  ? BRAIN SURGERY Left 11/07/2021  ? [facility] Dr. Herschel [place]  ? BREAST BIOPSY    ? BREAST EXCISIONAL BIOPSY    ? BREAST LUMPECTOMY WITH RADIOACTIVE SEED LOCALIZATION Bilateral 11/24/2014  ? Procedure: BILATERAL BREAST LUMPECTOMY WITH RADIOACTIVE SEED LOCALIZATION;  Surgeon: [name], MD;  Location: [place];  Service: General;  Laterality: Bilateral;  ? CHOLECYSTECTOMY    ? COLONOSCOPY    ? DILATION AND CURETTAGE OF UTERUS    ? ESOPHAGOGASTRODUODENOSCOPY (EGD) WITH PROPOFOL N/A 07/23/2016  ? Procedure:  ESOPHAGOGASTRODUODENOSCOPY (EGD) WITH PROPOFOL;  Surgeon: Laurence Spates, MD;  [facility];  Service: Endoscopy;  Laterality: N/A;  ? ESOPHAGOGASTRODUODENOSCOPY (EGD) WITH PROPOFOL N/A 06/11/2018  ? Procedure: ESOPHAGOGASTRODUODENOSCOPY (EGD) WITH PROPOFOL;  Surgeon: Laurence Spates, MD;  [facility];  Service: Endoscopy;  Laterality: N/A;  ? LUMBAR LAMINECTOMY  2010  ? ORIF WRIST FRACTURE Right 11/24/2020  ? Procedure: OPEN REDUCTION INTERNAL FIXATION RIGHT WRIST FRACTURE;  Surgeon: Renette Butters, MD;  [facility];  Service: Orthopedics;  Laterality: Right;  ? UMBILICAL HERNIA REPAIR    ? age 27  ? UPPER GI ENDOSCOPY    ? ?Patient Active Problem List  ? Diagnosis Date Noted  ? Cerebellar tumor ([place]) 11/15/2021  ? Comorbid sleep-related hypoventilation 10/19/2021  ? Urinary incontinence/areflexic bladder 10/05/2021  ? OSA, unable to tolerate CPAP (vomited every night) 09/05/2021  ? Chronic pain syndrome 09/05/2021  ? Chronic nausea 09/05/2021  ? Neurogenic bladder, followed by Urology, instructed I&O cath BID 09/05/2021  ? Intolerance of continuous positive airway pressure (CPAP) ventilation 09/03/2021  ? Insomnia secondary to chronic pain 07/24/2021  ? Primary osteoarthritis of right knee 07/03/2021  ? Myofascial pain dysfunction  syndrome 07/03/2021  ? Fibromyalgia 07/03/2021  ? Hyponatremia 07/03/2021  ? Primary osteoarthritis of left knee 07/03/2021  ? Body mass index (BMI) 50.0-59.9, adult ([place]) 03/07/2021  ? Allergic rhinitis due to animal (cat) (dog) hair and dander 10/06/2020  ? Allergic rhinitis due to pollen 10/06/2020  ? Moderate persistent asthma, uncomplicated 82/95/6213  ? Cerebellar mass 10/06/2020  ? Paresthesia 05/27/2020  ? Diabetes mellitus ([place]) 05/25/2020  ? Hypertension associated with diabetes ([place]) 05/25/2020  ? Hyperlipidemia associated with type 2 diabetes mellitus ([place]) 05/25/2020  ? Vitamin D deficiency 05/25/2020  ? NAFLD (nonalcoholic fatty liver disease)  05/25/2020  ? Sciatica of left side 05/25/2020  ? ? ?REFERRING DIAG: D49.6 (ICD-10-CM) - Neoplasm of unspecified behavior of brain   ? ?THERAPY DIAG:  ?Muscle weakness (generalized) ? ?Other lack of coordination ? ?Other abnormalities of gait and mobility ? ?Unsteadiness on feet ? ?PERTINENT HISTORY: From H&P note on 11/15/2021:  "On the day of admission 11/07/2021, the patient was taken to the operating room by Dr. Recardo Evangelist of neurosurgery and underwent left suboccipital craniotomy, resection of cerebellar tumor. She tolerated the procedure well. Postoperative MRI performed on 3/22 revealed a small area of diffusion restriction in the left cerebellum."  Patient transferred to [facility] on 11/15/2021, discharged 11/24/2021 per pt.  fibromyalgia, DM2, asthma, HTN, OSA, migraines, bilateral knee OA, myofascial pain syndrome ?  ? ?PRECAUTIONS: Fall ? ?SUBJECTIVE: She is still not resting well due to hole in CPAP hose, she is having replaced this week.  ? ?PAIN:  ?She endorses pain from prior session. ?Are you having pain? Yes: NPRS scale: 8.5/10 ?Pain location: All over (fibromyalgia), worse in head and neck and BLE (LLE worse) ? ?OBJECTIVE:  ?  ?DIAGNOSTIC FINDINGS: From Brain MRI on 09/02/2021:  "Increased size of left superior cerebellar vermis mass, now ?measuring 1.5 x 1.5 cm, previously 1.1 x 1.0 cm. Increased mild surrounding edema."  Future brain MRI ordered w/o date visible at this time. ?  ?COGNITION: ?Overall cognitive status: No family/caregiver present to determine baseline cognitive functioning - pt states "I feel stupid", she elaborates that she is having a hard time processing. ?  ?  ?TODAY'S TREATMENT:  ?Gait ?42' + , RW, close Supervision, min verbal cuing for improved posture and step into RW, pt maintains corrections better during first bout, difficulty during second bout due to fatigue requiring inc rest following second bout.  Pt ambulates w/ dec speed.  Cued to inc step through w/ LLE to prevent toe  drag x1. ? ?NMR ?Standing EOM using BUE support on RW: performing alt toe taps to cone at midline 4x6 alt LE w/ pt requiring rest between sets; SLS 2x30sec each LE requiring seated rest between reps ? ?Pt requires extended rest between and following activities.  Dyspneic following extended activity w/ recovery <36mn. ?  ?PATIENT EDUCATION: ?Education details: Discussed pain management with positional changes at home and comfort for sleeping.  Instructed to bring rollator from home for next visit to trial before using regularly as pt does not feel ready to move away from [place] ?Person educated: Patient ?Education method: Explanation  ?Education comprehension: verbalized understanding and needs further education ?  ?  ?HOME EXERCISE PROGRAM: ?Access Code: [email or web address] ?URL: [email or web address] ?Date: 12/06/2021 ?Prepared by: JMickie BailPlaster ? ?Exercises ?- Sit to Stand Without Arm Support  - 1 x daily - 7 x weekly - 3 sets - 10 reps ?- Standing March with Counter Support  - 1 x daily - 7 x weekly -  3 sets - 10 reps ?- Chin Tuck  - 1 x daily - 7 x weekly - 3 sets - 10 reps ?- Seated Cervical Sidebending Stretch  - 1 x daily - 7 x weekly - 3 sets - 10 reps ? - Supine Bridge  - 1 x daily - 4 x weekly - 3 sets - 10 reps ?- Supine Active Straight Leg Raise  - 1 x daily - 4 x weekly - 2 sets - 8 reps ?- Forward and Backward Monster Walk with Counter Support  - 1 x daily - 4 x weekly - 3 sets - 10 reps ?- Seated Heel Raise  - 1 x daily - 4 x weekly - 2 sets - 12 reps ?  ?GOALS: ?Goals reviewed with patient? Yes ?  ?SHORT TERM GOALS: Target date:  01/12/2022 ?  ?Pt will be independent with initial strength and balance HEP with supervision from family. ?Baseline:  To be established. ?Goal status: INITIAL ?  ?2.  Pt will decrease 5xSTS to <35 seconds w/ BUE support in order to demonstrate decreased risk for falls and improved functional bilateral LE strength and power. ?Baseline: 38.51sec w/ BUE ?Goal status: INITIAL ?   ?3.  Pt will demonstrate a gait speed of >1 feet/sec in order to decrease risk for falls. ?Baseline: 0.76 ft/sec ?Goal status: INITIAL ?  ?4.  Pt will improve normal TUG to less than or equal to 30 seconds w/L

## 2021-12-26 NOTE — Therapy (Signed)
?OUTPATIENT SPEECH LANGUAGE PATHOLOGY TREATMENT NOTE ? ? ?Patient Name: Virginia Luna ?MRN: [id number] ?DOB:April 15, 1961, 61 y.o., female ?Today's Date: 12/26/2021 ? ?PCP: Shirline Frees, MD ?REFERRING PROVIDER: Barbie Banner, PA-C  ? ?END OF SESSION:  ? End of Session - 12/26/21 1010   ? ? Visit Number 6   ? Number of Visits 25   ? Date for SLP Re-Evaluation 02/20/22   ? Authorization Type [employer]   ? Progress Note Due on Visit 10   ? SLP Start Time 0848   ? SLP Stop Time  0930   ? SLP Time Calculation (min) 42 min   ? Activity Tolerance Patient tolerated treatment well   ? ?  ?  ? ?  ? ? ? ? ? ?Past Medical History:  ?Diagnosis Date  ? Allergies   ? Anemia   ? Arthritis   ? Asthma   ? Back pain   ? Chronic pain   ? Complication of anesthesia   ? woke up during colonoscopy  ? Diabetes ([place])   ? Diabetes mellitus without complication ([employer] [employer])   ? Edema, lower extremity   ? Fibroid   ? Fibromyalgia   ? Chronic  ? GERD (gastroesophageal reflux disease)   ? Headache   ? migraines  ? High blood pressure   ? History of hiatal hernia   ? History of stomach ulcers   ? IBS (irritable bowel syndrome)   ? Joint pain   ? Sleep apnea   ? did use cpap-lost 25lb-says she does not need it   NO CPAP  ? Wears contact lenses   ? ?Past Surgical History:  ?Procedure Laterality Date  ? BRAIN SURGERY Left 11/07/2021  ? [facility] Dr. Herschel [place]  ? BREAST BIOPSY    ? BREAST EXCISIONAL BIOPSY    ? BREAST LUMPECTOMY WITH RADIOACTIVE SEED LOCALIZATION Bilateral 11/24/2014  ? Procedure: BILATERAL BREAST LUMPECTOMY WITH RADIOACTIVE SEED LOCALIZATION;  Surgeon: [name], MD;  Location: [place];  Service: General;  Laterality: Bilateral;  ? CHOLECYSTECTOMY    ? COLONOSCOPY    ? DILATION AND CURETTAGE OF UTERUS    ? ESOPHAGOGASTRODUODENOSCOPY (EGD) WITH PROPOFOL N/A 07/23/2016  ? Procedure: ESOPHAGOGASTRODUODENOSCOPY (EGD) WITH PROPOFOL;  Surgeon: Laurence Spates, MD;  [facility];  Service: Endoscopy;   Laterality: N/A;  ? ESOPHAGOGASTRODUODENOSCOPY (EGD) WITH PROPOFOL N/A 06/11/2018  ? Procedure: ESOPHAGOGASTRODUODENOSCOPY (EGD) WITH PROPOFOL;  Surgeon: Laurence Spates, MD;  [facility];  Service: Endoscopy;  Laterality: N/A;  ? LUMBAR LAMINECTOMY  2010  ? ORIF WRIST FRACTURE Right 11/24/2020  ? Procedure: OPEN REDUCTION INTERNAL FIXATION RIGHT WRIST FRACTURE;  Surgeon: Renette Butters, MD;  [facility];  Service: Orthopedics;  Laterality: Right;  ? UMBILICAL HERNIA REPAIR    ? age 43  ? UPPER GI ENDOSCOPY    ? ?Patient Active Problem List  ? Diagnosis Date Noted  ? Cerebellar tumor ([place]) 11/15/2021  ? Comorbid sleep-related hypoventilation 10/19/2021  ? Urinary incontinence/areflexic bladder 10/05/2021  ? OSA, unable to tolerate CPAP (vomited every night) 09/05/2021  ? Chronic pain syndrome 09/05/2021  ? Chronic nausea 09/05/2021  ? Neurogenic bladder, followed by Urology, instructed I&O cath BID 09/05/2021  ? Intolerance of continuous positive airway pressure (CPAP) ventilation 09/03/2021  ? Insomnia secondary to chronic pain 07/24/2021  ? Primary osteoarthritis of right knee 07/03/2021  ? Myofascial pain dysfunction syndrome 07/03/2021  ? Fibromyalgia 07/03/2021  ? Hyponatremia 07/03/2021  ? Primary osteoarthritis of left knee 07/03/2021  ?  Body mass index (BMI) 50.0-59.9, adult ([place]) 03/07/2021  ? Allergic rhinitis due to animal (cat) (dog) hair and dander 10/06/2020  ? Allergic rhinitis due to pollen 10/06/2020  ? Moderate persistent asthma, uncomplicated 08/08/7587  ? Cerebellar mass 10/06/2020  ? Paresthesia 05/27/2020  ? Diabetes mellitus ([place]) 05/25/2020  ? Hypertension associated with diabetes ([place]) 05/25/2020  ? Hyperlipidemia associated with type 2 diabetes mellitus ([place]) 05/25/2020  ? Vitamin D deficiency 05/25/2020  ? NAFLD (nonalcoholic fatty liver disease) 05/25/2020  ? Sciatica of left side 05/25/2020  ? ? ?ONSET DATE: March 2023 (surgery 11/07/2021) ? ?REFERRING DIAG: D49.6  (ICD-10-CM) - Cerebellar tumor ([place])  ? ?THERAPY DIAG:  ?Cognitive communication deficit ? ?Aphasia ? ?SUBJECTIVE: "I see my doctor soon" ? ?PAIN:  ?Are you having pain? Yes ?NPRS scale: 8.5/10 ?Pain location: head and legs ? ?OBJECTIVE:  ? ?TODAY'S TREATMENT: ?SKILLED INTERVENTIONS:  ?12-26-21: Re-education provided on attention and memory compensations. Discussion on internal vs external memory strategies. Pt primary using external compensations, with reported success. Using calendar, planner, writing lists and notes, MyChart for doctors information. Education on internal memory strategies, practice with creating story. Pt able to recall presented information with 80% accuracy. Led pt through procedural discourse activity. Able to direct ST how she'd make favorite meal with occasional mod-A for clarity and details.  ? ?12-21-21: ST led pt through narrative discourse training, using NARNIA protocol. With this outline, pt able to tell x3 personal stories with increased details and clarity of narrative. Prior to initiation, given narrative prompt pt with vague response lacking content or important details relevant for comprehension of novel listener. Discussed  Pt reports continued successful implementation of calendar system to manage her schedule. She has created a routine of referring to planner/calendar daily to orient to days activities. Pt able to use calendar on phone to relay dates of x2 upcoming doctors appointments date/time with mod-I. Led pt through cognitive exercises regarding schedule management. 100% accuracy answering time/planning related questions with rare min-A. ST recommends pt ask for information from Drs visits in writing or taking notes. Discussion on ability to use mychart to view information she may have forgotten.  ? ?12-19-21: Target anomia strategies and compensations d/t pt c/o difficulty with verbal expression. Pt unable to provide additional information regarding these episodes, based on ST  observations suspect cognitive component to impaired verbal expression. Provide education on word finding strategies. Led pt through [facility] for x3 personally relevant objects. Pt requires occasional mod A to complete. Benefits from examples, guiding questions, rare phonemic cues. Tells ST "this was hard, I had to close my eyes to think." Addressed prospective memory by training pt to use reminder when generating calendar appointment.  ? ?12-12-21: Completed CLQT this date. Low WNL for domains of attention, ex functions, visuospatial skills. Overall severity rating of mild. Provide education on internal vs. external memory strategies to include use of device, lists, calendar/planner, association, repetition, and creating a story. Generated list with pt of information she would like to be able to recall. Use of association during structured task, pt with 100% accuracy recalling paired items with mod-A faded to mod-I as task progressed.  ? ?12-06-21: Pt reports to using computer and paper planner to manage appointments. Discussed reasoning why pt is using both and encouraged her to continue to promote IND in orientation and managing schedule. ST provides education on establishing routine for taking meds, encouraged pt to think about best time to take. Pt reports to planning to move home this week. Encouraged  pt to try managing meds IND this week with god-daughter supervising to ensure she is prepared to do it w/o her assistance once she moves. Initiated CLQT this date. Pt demonstrates poor frustration tolerance throughout tasks, becoming easily discouraged. ST provides reassurance of nature of assessment just to gain information. Pt scores moderate for memory and mild for language. Other domains not yet complete. ? ?PATIENT EDUCATION: ?Education details: see above ?Person educated: Patient ?Education method: Explanation, Demonstration, and Handouts ?Education comprehension: verbalized understanding, returned demonstration,  and needs further education ?  ?  ?GOALS: ?Goals reviewed with patient? Yes ?  ?SHORT TERM GOALS: Target date: 12/27/2021 ?  ?Pt will complete standardized cognitive assessment to better appreciate reported cogni

## 2021-12-26 NOTE — Therapy (Signed)
?OUTPATIENT OCCUPATIONAL THERAPY TREATMENT NOTE ? ? ?Patient Name: Virginia Luna ?MRN: [id number] ?DOB:1961-05-01, 61 y.o., female ?Today's Date: 12/26/2021 ? ?PCP: Virginia Frees, MD ?REFERRING PROVIDER: Dr.Lovorn/ Virginia Grill PA-C  ? ?END OF SESSION:  ? OT End of Session - 12/26/21 0835   ? ? Visit Number 7   ? Number of Visits 25   ? Date for OT Re-Evaluation 02/20/22   ? Authorization Type [employer]   ? Authorization - Visit Number 7   ? Authorization - Number of Visits 10   ? Progress Note Due on Visit 10   ? OT Start Time [phone]   ? OT Stop Time 0800   ? OT Time Calculation (min) 41 min   ? Activity Tolerance Patient limited by pain   ? Behavior During Therapy [place] affect   ? ?  ?  ? ?  ? ? ? ? ? ?Past Medical History:  ?Diagnosis Date  ? Allergies   ? Anemia   ? Arthritis   ? Asthma   ? Back pain   ? Chronic pain   ? Complication of anesthesia   ? woke up during colonoscopy  ? Diabetes ([place])   ? Diabetes mellitus without complication ([place])   ? Edema, lower extremity   ? Fibroid   ? Fibromyalgia   ? Chronic  ? GERD (gastroesophageal reflux disease)   ? Headache   ? migraines  ? High blood pressure   ? History of hiatal hernia   ? History of stomach ulcers   ? IBS (irritable bowel syndrome)   ? Joint pain   ? Sleep apnea   ? did use cpap-lost 25lb-says she does not need it   NO CPAP  ? Wears contact lenses   ? ?Past Surgical History:  ?Procedure Laterality Date  ? BRAIN SURGERY Left 11/07/2021  ? [facility] Dr. [facility]chel Luna  ? BREAST BIOPSY    ? BREAST EXCISIONAL BIOPSY    ? BREAST LUMPECTOMY WITH RADIOACTIVE SEED LOCALIZATION Bilateral 11/24/2014  ? Procedure: BILATERAL BREAST LUMPECTOMY WITH RADIOACTIVE SEED LOCALIZATION;  Surgeon: Virginia Luna, MD;  Location: [place];  Service: General;  Laterality: Bilateral;  ? CHOLECYSTECTOMY    ? COLONOSCOPY    ? DILATION AND CURETTAGE OF UTERUS    ? ESOPHAGOGASTRODUODENOSCOPY (EGD) WITH PROPOFOL N/A 07/23/2016  ? Procedure:  ESOPHAGOGASTRODUODENOSCOPY (EGD) WITH PROPOFOL;  Surgeon: Virginia Spates, MD;  [facility];  Service: Endoscopy;  Laterality: N/A;  ? ESOPHAGOGASTRODUODENOSCOPY (EGD) WITH PROPOFOL N/A 06/11/2018  ? Procedure: ESOPHAGOGASTRODUODENOSCOPY (EGD) WITH PROPOFOL;  Surgeon: Virginia Spates, MD;  [facility];  Service: Endoscopy;  Laterality: N/A;  ? LUMBAR LAMINECTOMY  2010  ? ORIF WRIST FRACTURE Right 11/24/2020  ? Procedure: OPEN REDUCTION INTERNAL FIXATION RIGHT WRIST FRACTURE;  Surgeon: Virginia Butters, MD;  [facility];  Service: Orthopedics;  Laterality: Right;  ? UMBILICAL HERNIA REPAIR    ? age 70  ? UPPER GI ENDOSCOPY    ? ?Patient Active Problem List  ? Diagnosis Date Noted  ? Cerebellar tumor ([place]) 11/15/2021  ? Comorbid sleep-related hypoventilation 10/19/2021  ? Urinary incontinence/areflexic bladder 10/05/2021  ? OSA, unable to tolerate CPAP (vomited every night) 09/05/2021  ? Chronic pain syndrome 09/05/2021  ? Chronic nausea 09/05/2021  ? Neurogenic bladder, followed by Urology, instructed I&O cath BID 09/05/2021  ? Intolerance of continuous positive airway pressure (CPAP) ventilation 09/03/2021  ? Insomnia secondary to chronic pain 07/24/2021  ? Primary osteoarthritis of right knee 07/03/2021  ?  Myofascial pain dysfunction syndrome 07/03/2021  ? Fibromyalgia 07/03/2021  ? Hyponatremia 07/03/2021  ? Primary osteoarthritis of left knee 07/03/2021  ? Body mass index (BMI) 50.0-59.9, adult ([place]) 03/07/2021  ? Allergic rhinitis due to animal (cat) (dog) hair and dander 10/06/2020  ? Allergic rhinitis due to pollen 10/06/2020  ? Moderate persistent asthma, uncomplicated 12/45/8099  ? Cerebellar mass 10/06/2020  ? Paresthesia 05/27/2020  ? Diabetes mellitus ([place]) 05/25/2020  ? Hypertension associated with diabetes ([place]) 05/25/2020  ? Hyperlipidemia associated with type 2 diabetes mellitus ([place]) 05/25/2020  ? Vitamin D deficiency 05/25/2020  ? NAFLD (nonalcoholic fatty liver disease)  05/25/2020  ? Sciatica of left side 05/25/2020  ? ? ?ONSET DATE: 11/07/21  ? ?REFERRING DIAG: cerebellar tumor, s/p craniotomy  ? ?THERAPY DIAG:  ?Muscle weakness (generalized) ? ?Other lack of coordination ? ?Frontal lobe and executive function deficit ? ?Other abnormalities of gait and mobility ? ?Attention and concentration deficit ? ?Other disturbances of skin sensation ? ? ?PERTINENT HISTORY: Virginia Luna is a 61 year old female who recently presented to hospitalized at [facility] [place] [facility] complaining of headaches and tremors. 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 She was admitted 11/07/2021, underwent left suboccipital craniotomy, resection of cerebellar tumor. Postoperative MRI performed on 3/22 revealed a small area of diffusion restriction in the left cerebellum Pt was transferred to  [facility] 11/15/21 and  she was d/c home 11/24/21.PMH: arthritis, asthma, chronic pain, DM, fibromyalgia, myofascial pain syndrome, HTN, IBS, sleep apnea Brain ?  ?PRECAUTIONS: Fall, no driving ? ?SUBJECTIVE: I've had bad "shakes" the last few days ? ?PAIN:  ?Are you having pain? Yes: NPRS scale: 8/10 ?Pain location: head and neck, and legs (all over, generalized) ?Pain description: constant ?Aggravating factors: surgery, fibromyalgia, myofascial pain syndrome ?Relieving factors: meds ? ? ? ?OBJECTIVE:  ? ?TODAY'S TREATMENT:  ?Pt ambulated to kitchen with walker, pt was able to place and remove items from cabinets with supervision and min v.c for safety. ? ?Arm bike x 6 mins level 1 for conditioning ? ?Sitting, placing medium sized pegs in arc pegboard with each UE with min difficulty, min v.c for compensation for tremors. Removeing with left and right UE's min difficulty/ v.c ?Pt ambulated to BR, min A with navigating heavy bathroom door and assist, min v.c ? ? ? ?PATIENT EDUCATION: ?Education details: walker safety with rollator, retrieving items form  cabinet ?Person educated: pt. ?Education method: explanation, demo, verbal cues ?Education comprehension: returned demo. With min v.c ? ?HOME EXERCISE PROGRAM: ?putty HEP, coordination HEP, sh flex supine (12/06/21); BUE HEP (12/14/21) ? ? ?GOALS: ?  ?  ?SHORT TERM GOALS: Target date: 12/26/2021 ?  ?I with HEP ?  ?Goal status: need reinforcement (issued 12/06/21) ?  ?2.  Pt will perform dressing mod I in a reasonable amount of time ?Baseline: supervision, increased time required ?Goal status: ONGOING ?  ?3.  Pt will perform basic home management with min A. ?Baseline: dependent ?Goal status: met pt reports folding laundry and  pt reports washing dishes standing at sink,  ?  ?4.  Pt will perform simple cooking task with min a demonstrating good safety awareness. ?Baseline: dependent ?Goal status:  ongoing, has not attempted ?  ?5.  Pt will increase bilateral grip strength by 5 lbs for increased ease opening containers. ?Baseline RUE 21.1 lbs, LUE 14.9 lbs :  ?Goal status: goal met RUE: 35.4   LUE 37.4 ?  ?6.  Pt will demonstrate  ability to stand for functional/ ADL tasks x 15 mins without rest break and no LOB. ?Baseline: poor and inconsistent endurance ?Goal status:  ongoing  stands  for 5 mins  for washing dishes ?  ?LONG TERM GOALS: Target date: 02/20/2022 ?  ?I with updated HEP. ?  ?Goal status: INITIAL ?  ?2.  Pt will perform all basic ADLS mod I ?Baseline: supervision ?Goal status: INITIAL ?  ?3.  Pt will perform basic home management modified I ?Baseline: dependent ?Goal status: INITIAL ?  ?4.  Pt will perform basic cooking mod I demonstrating good safety awareness. ?Baseline: depdnent ?Goal status: INITIAL ?  ?5.  Pt will demonstrate improved fine motor coordination for ADLS as evidenced by decreasing 9 hole peg test score by 3 secs, bilaterally. ?Baseline: R 35.06, L 38.53 ?Goal status: INITIAL ?  ?6.  Pt will demonstrate ability to retrieve a 3 lbs weight from overhead shelf with left and right UE's individually  demonstrating good control. ?Baseline: decreased bilateral strength and control. ?Goal status: INITIAL ?  ?ASSESSMENT: ?  ?CLINICAL IMPRESSION:  ?Pt is slowly progressing towards goals.  Pt demonstrates less ataxia today. Pt requi

## 2021-12-26 NOTE — Patient Instructions (Signed)
Strategies for Improving Your Attention and Memory ? ?Complete one task at a time ?Avoid multitasking ?Complete one task before starting a new one ?Write a note to yourself if you think of something else that needs to be done ?Let others know when you need quiet time and can't be interrupted ?Don't answer the phone, texts, or emails while you are working on another task ? ?Put aside distracting thoughts ?If you find your mind wandering, refocus your attention on the speaker ?Avoid off-topic comments or responses that may divert your attention  ?If something important comes to mind, let the speaker know and pause to write yourself a note: "Do you mind holding on a minute, I have to write something down." ?Put thoughts on hold and focus on salient information ? ?Limit distractions in your environment ?Think about the environment around you ?Limit background noise by turning off the TV or music, putting your phone away ?Close the door and work in quiet ? ?Use active listening ?Actively participate in the conversation to stay focused ?Paraphrase what you have heard to include the most important details ?Adding some associations may help you remember ?Ask questions to clarify certain points ?Summarize the speaker's comments periodically ?Avoid nodding your head and using "mhm" responses as these are more passive and don't help your attention ? ?Write down information ?Write down pertinent information as it comes up, such as telephone numbers, names of people, addresses, details from appointments and conversations, etc.  ?

## 2021-12-27 ENCOUNTER — Ambulatory Visit: Payer: Medicare HMO | Admitting: Occupational Therapy

## 2021-12-27 ENCOUNTER — Encounter: Payer: Self-pay | Admitting: Occupational Therapy

## 2021-12-27 ENCOUNTER — Ambulatory Visit: Payer: Medicare HMO | Admitting: Speech Pathology

## 2021-12-27 DIAGNOSIS — R41844 Frontal lobe and executive function deficit: Secondary | ICD-10-CM | POA: Diagnosis not present

## 2021-12-27 DIAGNOSIS — R2689 Other abnormalities of gait and mobility: Secondary | ICD-10-CM | POA: Diagnosis not present

## 2021-12-27 DIAGNOSIS — R41840 Attention and concentration deficit: Secondary | ICD-10-CM | POA: Diagnosis not present

## 2021-12-27 DIAGNOSIS — R2681 Unsteadiness on feet: Secondary | ICD-10-CM | POA: Diagnosis not present

## 2021-12-27 DIAGNOSIS — R4701 Aphasia: Secondary | ICD-10-CM

## 2021-12-27 DIAGNOSIS — R208 Other disturbances of skin sensation: Secondary | ICD-10-CM

## 2021-12-27 DIAGNOSIS — J3089 Other allergic rhinitis: Secondary | ICD-10-CM | POA: Diagnosis not present

## 2021-12-27 DIAGNOSIS — R41841 Cognitive communication deficit: Secondary | ICD-10-CM | POA: Diagnosis not present

## 2021-12-27 DIAGNOSIS — R278 Other lack of coordination: Secondary | ICD-10-CM | POA: Diagnosis not present

## 2021-12-27 DIAGNOSIS — J301 Allergic rhinitis due to pollen: Secondary | ICD-10-CM | POA: Diagnosis not present

## 2021-12-27 DIAGNOSIS — R471 Dysarthria and anarthria: Secondary | ICD-10-CM | POA: Diagnosis not present

## 2021-12-27 DIAGNOSIS — M6281 Muscle weakness (generalized): Secondary | ICD-10-CM | POA: Diagnosis not present

## 2021-12-27 DIAGNOSIS — J3081 Allergic rhinitis due to animal (cat) (dog) hair and dander: Secondary | ICD-10-CM | POA: Diagnosis not present

## 2021-12-27 NOTE — Progress Notes (Signed)
? ?NEUROLOGY FOLLOW UP OFFICE NOTE ? ?ROSIO WEISS ?[id number] ? ?Assessment/Plan:  ? ?1.Vestibular migraine/chronic migraine without aura, without status migrainosus, not intractable ?2. 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 ?3.  Black out spell (she zoned out) - unclear etiology.  While in rehab, she had episode of shaking which appeared to be restlessness in bed possibly due to her apnea.  No loss of consciousness.  Semiology not likely to be seizure. ?  ?Migraine prevention:  Botox.  In addition will start zonisamide '100mg'$  daily to help further reduce headache frequency (and address potential seizure although I am not convinced this was a seziure) ?Limit use of pain relievers to no more than 2 days out of week to prevent risk of rebound or medication-overuse headache. ?Keep headache diary ?Follow up 6 months  ?  ?  ?Subjective:  ?Virginia Luna is a 61 year old right-handed female with diabetes, chronic back pain, and IBS who follows up for migraine and brain mass.   ?  ?UPDATE: ?Repeat MRI of brain with and without contrast on 09/03/2021 personally reviewed showed increase size of the left superior cerebellar vermis mass from 1.1 x 1.0 cm to 1.5 x 1.5 cm with mild increased surrounding edema.  Plan was to undergo surgery with Dr. Ronnald Ramp.  However, she presented to [facility] in [place] with tremors and increased headaches.  She underwent craniotomy and tumor resection there with Dr. Recardo Evangelist of neurosurgery.  Postoperative MRI showed small area of diffusion restriction in the cerebellum.  No complications.  She was discharged to inpatient rehab where it was noted to have bilateral upper and lower extremity thrashing without loss of consciousness at night while in bed.  Evalauted by neurology who did not suspect seizure but rather secondary to her untreated sleep apnea.  Since discharge, she has been weak and having difficulty ambulating.   She is using a walker.  She is unsteady.  She just started a new CPAP.  She finds Botox to be helpful.  Headaches are no longer diffuse, just left sided although it can be painful.  Occurring 15 days a month.  She reports that she had an episode where she "blacked out".  She was sitting watching TV while talking to a friend on the phone and the next thing she knew, something else was playing on the TV suggesting time elapsed.  She was still on the phone with her friend. She does not currently drive. ? ?Current NSAIDS/analgesics:  Hydrocodone-acetaminophen (pain), diclofenac '75mg'$  ?Current triptans:  Maxalt '10mg'$  ?Current ergotamine:  none ?Current anti-emetic:  Zofran ODT '4mg'$  ?Current muscle relaxants:  Robaxin ?Current Antihypertensive medications:  Metoprolol succinate, amlodipine ?Current Antidepressant medications:  none ?Current Anticonvulsant medications: oxcarbazepine '900mg'$  at bedtime (for chronic pain) ?Current anti-CGRP:  none ?Current Vitamins/Herbal/Supplements:  D, melatonin ?Current Antihistamines/Decongestants:  Meclizine '25mg'$  PRN, Benadryl '50mg'$  QHS, Flonase ?Other therapy:  ice ?Hormone/birth control:  none ?Other medications:  Ambien, Seroquel ?  ?Caffeine:  No coffee.  Occasional Coke ?Diet:  1 gallon water daily.  Tries not to skip meals.   ?Exercise:  Unable due to pain, nausea and dizziness ?Depression/Anxiety:  yes ?Other pain:  fibromyalgia ?Sleep hygiene: Found to have mild OSA but with severe sleep hypoxia.  Started CPAP ?  ?HISTORY:  ?She has had migraines for many years.  They were manageable, usually occurring once a month.  They started to become more frequent in 2020, progressing to the point that she  now has a persistent daily headache.  They are typically bifrontal and pounding, associated with nausea, vomiting, vertigo, photophobia, phonophobia and blurred vision but no numbness or weakness.  Intensity will fluctuate from dull-moderate to severe about once a week for 2-3 days.   Sunlight, loud noise and quick movements are aggravating factors.  Resting in a dark and cool quiet room helps relieve pain.  She cannot really identify a specific trigger for her chronic daily headache but it may have followed a fall in which she tripped and hit her head. ?  ?She treats headache with sumatriptan (2-3 days a week) and treats nausea with Zofran and dizziness with meclizine.  She also takes hydrocodone for fibromyalgia. ?   ?MRI of brain with and without contrast on 09/03/2020 showed enhancing 8 x 6 mm posterior fossa mass left of midline with edema within the superior cerebellum.  Follow up MRI on 09/24/2020 was stable.  She was referred to neurosurgery who favored to closely monitor.  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  ?  ?  ?Past NSAIDS/analgesics:  Ibuprofen, naproxen, Excedrin/BC/Goody ?Past abortive triptans:  sumatriptan  ?Past abortive ergotamine:  none ?Past muscle relaxants:  Flexeril ?Past anti-emetic:  Promethazine '25mg'$  ?Past antihypertensive medications:  none ?Past antidepressant medications:  Amitriptyline or nortriptyline, venlafaxine ?Past anticonvulsant medications:  topiramate, gabapentin ?Past anti-CGRP:  Emgality ?Past vitamins/Herbal/Supplements:  none ?Past antihistamines/decongestants:  none ?Other past therapies:  vestibular rehab ?  ?  ?Family history of headache:  unknown ? ?PAST MEDICAL HISTORY: ?Past Medical History:  ?Diagnosis Date  ? Allergies   ? Anemia   ? Arthritis   ? Asthma   ? Back pain   ? Chronic pain   ? Complication of anesthesia   ? woke up during colonoscopy  ? Diabetes ([place])   ? Diabetes mellitus without complication ([place])   ? Edema, lower extremity   ? Fibroid   ? Fibromyalgia   ? Chronic  ? GERD (gastroesophageal reflux disease)   ? Headache   ? migraines  ? High blood pressure   ? History of hiatal hernia   ? History of stomach ulcers   ? IBS (irritable bowel syndrome)   ? Joint pain    ? Sleep apnea   ? did use cpap-lost 25lb-says she does not need it   NO CPAP  ? Wears contact lenses   ? ? ?MEDICATIONS: ?Current Outpatient Medications on File Prior to Visit  ?Medication Sig Dispense Refill  ? acetaminophen (TYLENOL) 325 MG tablet Take 1-2 tablets (325-650 mg total) by mouth every 4 (four) hours as needed for mild pain.    ? albuterol (PROVENTIL) (2.5 MG/3ML) 0.083% nebulizer solution Take 3 mLs (2.5 mg total) by nebulization every 6 (six) hours as needed for up to 14 days for wheezing or shortness of breath. 75 mL 0  ? amLODipine (NORVASC) 5 MG tablet Take 1 tablet (5 mg total) by mouth daily. 30 tablet 0  ? atorvastatin (LIPITOR) 10 MG tablet Take 1 tablet (10 mg total) by mouth at bedtime. 30 tablet 0  ? azelastine (ASTELIN) 0.1 % nasal spray Place 1 spray into both nostrils 2 (two) times daily as needed for rhinitis. 30 mL 0  ? levocetirizine (XYZAL) 5 MG tablet Take 1 tablet (5 mg total) by mouth every evening. 30 tablet 0  ? linaclotide (LINZESS) 72 MCG capsule Take 1 capsule (72 mcg total) by mouth daily as needed (constipation).  90 capsule 0  ? meclizine (ANTIVERT) 25 MG tablet Take 1 tablet (25 mg total) by mouth 3 (three) times daily as needed for dizziness. 60 tablet 0  ? melatonin 5 MG TABS Take 1 tablet (5 mg total) by mouth at bedtime. 30 tablet 0  ? methocarbamol (ROBAXIN) 750 MG tablet Take 1 tablet (750 mg total) by mouth every 8 (eight) hours as needed for muscle spasms. 60 tablet 0  ? montelukast (SINGULAIR) 10 MG tablet Take 1 tablet (10 mg total) by mouth at bedtime. 30 tablet 0  ? ondansetron (ZOFRAN ODT) 4 MG disintegrating tablet Take 1 tablet (4 mg total) by mouth every 6 (six) hours as needed for nausea or vomiting. 20 tablet 0  ? ondansetron (ZOFRAN) 4 MG tablet Take 1 tablet (4 mg total) by mouth every 8 (eight) hours as needed for nausea or vomiting. 90 tablet 0  ? Oxcarbazepine (TRILEPTAL) 300 MG tablet Take 3 tablets (900 mg total) by mouth at bedtime. 90 tablet 0   ? pantoprazole (PROTONIX) 40 MG tablet Take 1 tablet (40 mg total) by mouth daily. 30 tablet 0  ? polyethylene glycol (MIRALAX / GLYCOLAX) 17 g packet Take 17 g by mouth daily as needed for mild constipatio

## 2021-12-27 NOTE — Patient Instructions (Addendum)
Tips for Communicating with Dysarthria: ?Slow, Loud, Over-articulate ?Simplify message ?Reduce background noise ?Face your listener ?Consider FaceTime for phone conversations ?Over the phone, really amp it up  ? ?[place] is commonly referred to as ?[place]? (sorry, [place]) and goes by two nicknames: The [address] [place] and [place]. ? ?As of 2020, the population of [place] is 10.4 million. ? ?[place] is the capital of [place] today, but it wasn?t the first. [place] served as the first nominal capital and [employer] was established in 1766 as the first permanent capital.  ? ?Our state motto is ?Esse Quam Videri,? which translates in English to ?To Be Rather Than To Seem.? ? ? ?[place]?s unofficial State [place] is ?First in [place]; First in [place].? ? ?The [place] is ?The [place].? ? ?Like many states, [place] has even more inanimate and living [place].  ?State Beverage: Milk ?State Bird: Cardinal ?State Butterfly: [place] tiger swallowtail ?State Colors: Red and blue ?State Dance: Shag ?State Fish: Red drum ?State Flower: Flowering dogwood ?State Food: [place] grape and sweet potato ?State Fossil: Megalodon teeth ?State Gemstone: [place]ald ?State Insect: [place] honey bee ?State Mammal: [place] gray squirrel ?State Marsupial: [place] oppossum ?State Mineral: Gold ?State Reptile: [place] box turtle ?State Rock: [place] ?State Shell: Lu D[place]el ?State Tree: [place] ?

## 2021-12-27 NOTE — Progress Notes (Deleted)
PATIENT: Virginia Luna DOB: 1961/02/21  REASON FOR VISIT: follow up HISTORY FROM: patient  No chief complaint on file.    HISTORY OF PRESENT ILLNESS:  12/27/21 ALL:  Virginia Luna is a 61 y.o. female here today for follow up for OSA on CPAP. She was seen in consult with Dr Brett Fairy 07/2021 for concerns of insomnia. PSG showed overall mild OSA with total AHI of 10.9/hr but with significant worsening in REM sleep (51.2/hr) with noted hypoxia. CPAP titration showed apnea well managed with set pressure of 13cmH20 using small Eson 2 nasal mask, however, hypoxia persistent up to 11cmH20 and did not include REM sleep. ONO advised after starting CPAP.     HISTORY: (copied from Dr Dohmeier's previous note)  Virginia Luna is a 61 y.o. AA female patient and was seen here as a new Consult patient , upon referral on 07/24/2021 from Dr Juleen China.  Chief concern according to patient :  Internal referral for insomnia due to mental conditions. The patient is followed for non sleep issues by [facility], her primary neurologist.    The patietn weighted 289 pounds at her highest level and now is at 266 pounds. Her BMI remains at 50 .   I have the pleasure of seeing Virginia Luna 07-24-2021, a right-handed Black or [place] American female with chronic insomnia- related to  pain. Onset in her twenties,    She  has a past medical history of Allergic Rhinitis , Anemia, OSTEO_Arthritis, childhood and allergic Asthma, Chronic pain, Complication of anesthesia, Diabetes ([place]), Diabetes mellitus without complication ([place]), Edema, lower extremity, Fibroid, Fibromyalgia, GERD (gastroesophageal reflux disease), Headache, High blood pressure, History of hiatal hernia, History of stomach ulcers, IBS (irritable bowel syndrome), Joint pain, Sleep apnea, and Wears contact lenses. Fall injuries with fracture of right wrist    The patient had the first sleep study in the year 2020  with a diagnosis of OSA, [facility].  she was placed on CPAP, but d/c after developing nausea. She reported aerophagia.  Nocturia/ Enuresis 2-3 times, Sleep walking and talking, no Tonsillectomy, cervical spine DDD, no surgery.    Family medical /sleep history:  Brother on CPAP with OSA prior to his bariatric surgery- , insomnia sleep walking in siblings. .    Social history: Patient is disabled from [employer],  and lives in a household alone. Has a constant companion.  Family status is single- never married- Tobacco use; none.  ETOH use none,  Caffeine intake in form of Coffee( /) Soda( /) Tea ( 2 week) or energy drinks. Regular exercise in form of walking with friends.    Sleep habits are as follows: The patient's dinner time is between 6-6.30PM. The patient goes to bed at 9-10  PM and she struggles to sleep without medication ( this is a chronic condition and treated by Dr Kenton Kingfisher, attributed to FIBROMYALGIA ) continues to sleep for 4-5  hours, wakes for several bathroom breaks   The preferred sleep position is supine, with the support of 2-3 pillows. Severe GERD causes arousals and sometimes she will vomit at night- constant nausea and feeling of fullness.  She wakes sometimes feeling paralyzed, scared by these spell.  Dreams are reportedly rare. Used to have nightmares.  5.30  AM is the usual rise time. The patient wakes up spontaneously 5 AM-before an alarm. She reports not feeling refreshed or restored in AM, with symptoms such as dry mouth, morning headaches, and residual  fatigue.  Naps are taken infrequently after lunch, lasting from 10 to 30 minutes and are more refreshing than nocturnal sleep.     REVIEW OF SYSTEMS: Out of a complete 14 system review of symptoms, the patient complains only of the following symptoms, and all other reviewed systems are negative.  ESS:  ALLERGIES: Allergies  Allergen Reactions   Rocephin [Ceftriaxone] Anaphylaxis   Morphine And Related Itching and Nausea And  Vomiting    Doesn't work   Prednisone Itching and Swelling   Sulfa Antibiotics Itching and Swelling   Amitriptyline Other (See Comments)   Penicillin G Sodium Other (See Comments)    HOME MEDICATIONS: Outpatient Medications Prior to Visit  Medication Sig Dispense Refill   acetaminophen (TYLENOL) 325 MG tablet Take 1-2 tablets (325-650 mg total) by mouth every 4 (four) hours as needed for mild pain.     albuterol (PROVENTIL) (2.5 MG/3ML) 0.083% nebulizer solution Take 3 mLs (2.5 mg total) by nebulization every 6 (six) hours as needed for up to 14 days for wheezing or shortness of breath. 75 mL 0   amLODipine (NORVASC) 5 MG tablet Take 1 tablet (5 mg total) by mouth daily. 30 tablet 0   atorvastatin (LIPITOR) 10 MG tablet Take 1 tablet (10 mg total) by mouth at bedtime. 30 tablet 0   azelastine (ASTELIN) 0.1 % nasal spray Place 1 spray into both nostrils 2 (two) times daily as needed for rhinitis. 30 mL 0   levocetirizine (XYZAL) 5 MG tablet Take 1 tablet (5 mg total) by mouth every evening. 30 tablet 0   linaclotide (LINZESS) 72 MCG capsule Take 1 capsule (72 mcg total) by mouth daily as needed (constipation). 90 capsule 0   meclizine (ANTIVERT) 25 MG tablet Take 1 tablet (25 mg total) by mouth 3 (three) times daily as needed for dizziness. 60 tablet 0   melatonin 5 MG TABS Take 1 tablet (5 mg total) by mouth at bedtime. 30 tablet 0   methocarbamol (ROBAXIN) 750 MG tablet Take 1 tablet (750 mg total) by mouth every 8 (eight) hours as needed for muscle spasms. 60 tablet 0   montelukast (SINGULAIR) 10 MG tablet Take 1 tablet (10 mg total) by mouth at bedtime. 30 tablet 0   ondansetron (ZOFRAN ODT) 4 MG disintegrating tablet Take 1 tablet (4 mg total) by mouth every 6 (six) hours as needed for nausea or vomiting. 20 tablet 0   ondansetron (ZOFRAN) 4 MG tablet Take 1 tablet (4 mg total) by mouth every 8 (eight) hours as needed for nausea or vomiting. 90 tablet 0   Oxcarbazepine (TRILEPTAL) 300 MG  tablet Take 3 tablets (900 mg total) by mouth at bedtime. 90 tablet 0   pantoprazole (PROTONIX) 40 MG tablet Take 1 tablet (40 mg total) by mouth daily. 30 tablet 0   polyethylene glycol (MIRALAX / GLYCOLAX) 17 g packet Take 17 g by mouth daily as needed for mild constipation. 14 each 0   QUEtiapine (SEROQUEL) 300 MG tablet Take 1 tablet (300 mg total) by mouth at bedtime. 30 tablet 0   Semaglutide,0.25 or 0.'5MG'$ /DOS, (OZEMPIC, 0.25 OR 0.5 MG/DOSE,) 2 MG/1.5ML SOPN Inject 0.5 mg into the skin once a week. (Patient taking differently: Inject 0.5 mg into the skin every Sunday.) 1.5 mL 0   senna-docusate (SENOKOT-S) 8.6-50 MG tablet Take 2 tablets by mouth at bedtime. 60 tablet 0   SUMAtriptan (IMITREX) 100 MG tablet Take 1 tablet (100 mg total) by mouth every 2 (two) hours as needed  for migraine or headache (max 2x in 24 hour period). May repeat in 2 hours if headache persists or recurs. 10 tablet 0   Vitamin D, Ergocalciferol, (DRISDOL) 1.25 MG (50000 UNIT) CAPS capsule TAKE ONE CAPSULE BY MOUTH EVERY 7 DAYS (Patient taking differently: Take 50,000 Units by mouth every Sunday.) 4 capsule 0   zolpidem (AMBIEN) 10 MG tablet Take 1 tablet (10 mg total) by mouth at bedtime. 30 tablet 5   No facility-administered medications prior to visit.    PAST MEDICAL HISTORY: Past Medical History:  Diagnosis Date   Allergies    Anemia    Arthritis    Asthma    Back pain    Chronic pain    Complication of anesthesia    woke up during colonoscopy   Diabetes ([place])    Diabetes mellitus without complication ([facility])    Edema, lower extremity    Fibroid    Fibromyalgia    Chronic   GERD (gastroesophageal reflux disease)    Headache    migraines   High blood pressure    History of hiatal hernia    History of stomach ulcers    IBS (irritable bowel syndrome)    Joint pain    Sleep apnea    did use cpap-lost 25lb-says she does not need it   NO CPAP   Wears contact lenses     PAST SURGICAL HISTORY: Past  Surgical History:  Procedure Laterality Date   BRAIN SURGERY Left 11/07/2021   [facility] Dr. Herschel [place]   BREAST BIOPSY     BREAST EXCISIONAL BIOPSY     BREAST LUMPECTOMY WITH RADIOACTIVE SEED LOCALIZATION Bilateral 11/24/2014   Procedure: BILATERAL BREAST LUMPECTOMY WITH RADIOACTIVE SEED LOCALIZATION;  Surgeon: [name], MD;  Location: [facility];  Service: General;  Laterality: Bilateral;   CHOLECYSTECTOMY     COLONOSCOPY     DILATION AND CURETTAGE OF UTERUS     ESOPHAGOGASTRODUODENOSCOPY (EGD) WITH PROPOFOL N/A 07/23/2016   Procedure: ESOPHAGOGASTRODUODENOSCOPY (EGD) WITH PROPOFOL;  Surgeon: Laurence Spates, MD;  Location: [facility];  Service: Endoscopy;  Laterality: N/A;   ESOPHAGOGASTRODUODENOSCOPY (EGD) WITH PROPOFOL N/A 06/11/2018   Procedure: ESOPHAGOGASTRODUODENOSCOPY (EGD) WITH PROPOFOL;  Surgeon: Laurence Spates, MD;  Location: [facility];  Service: Endoscopy;  Laterality: N/A;   LUMBAR LAMINECTOMY  2010   ORIF WRIST FRACTURE Right 11/24/2020   Procedure: OPEN REDUCTION INTERNAL FIXATION RIGHT WRIST FRACTURE;  Surgeon: Renette Butters, MD;  Location: [facility];  Service: Orthopedics;  Laterality: Right;   UMBILICAL HERNIA REPAIR     age 75   UPPER GI ENDOSCOPY      FAMILY HISTORY: Family History  Problem Relation Age of Onset   Obesity Mother    Diabetes Father    High blood pressure Father    Sudden death Father    Breast cancer Paternal Grandmother    Breast cancer Paternal Aunt     SOCIAL HISTORY: Social History   Socioeconomic History   Marital status: Single    Spouse name: Not on file   Number of children: Not on file   Years of education: Not on file   Highest education level: Some college, no degree  Occupational History   Occupation: disabled    Comment: Psychologist, occupational at daycare  Tobacco Use   Smoking status: Never   Smokeless tobacco: Never  Vaping Use   Vaping Use: Never used  Substance and Sexual Activity   Alcohol use: No    Drug use: No  Sexual activity: Not Currently  Other Topics Concern   Not on file  Social History Narrative   Lives w roommate   Right handed   Caffeine: 2 cups of tea a week. 2 sodas a week   Social Determinants of Radio broadcast assistant Strain: Not on file  Food Insecurity: Not on file  Transportation Needs: Not on file  Physical Activity: Not on file  Stress: Not on file  Social Connections: Not on file  Intimate Partner Violence: Not on file     PHYSICAL EXAM  There were no vitals filed for this visit. There is no height or weight on file to calculate BMI.  Generalized: Well developed, in no acute distress  Cardiology: normal rate and rhythm, no murmur noted Respiratory: clear to auscultation bilaterally  Neurological examination  Mentation: Alert oriented to time, place, history taking. Follows all commands speech and language fluent Cranial nerve II-XII: Pupils were equal round reactive to light. Extraocular movements were full, visual field were full  Motor: The motor testing reveals 5 over 5 strength of all 4 extremities. Good symmetric motor tone is noted throughout.  Gait and station: Gait is normal.    DIAGNOSTIC DATA (LABS, IMAGING, TESTING) - I reviewed patient records, labs, notes, testing and imaging myself where available.      View : No data to display.           Lab Results  Component Value Date   WBC 9.4 11/24/2021   HGB 10.2 (L) 11/24/2021   HCT 32.4 (L) 11/24/2021   MCV 84.8 11/24/2021   PLT 283 11/24/2021      Component Value Date/Time   NA 144 11/24/2021 0511   NA 143 09/05/2021 1523   K 3.9 11/24/2021 0511   CL 108 11/24/2021 0511   CO2 28 11/24/2021 0511   GLUCOSE 118 (H) 11/24/2021 0511   BUN 13 11/24/2021 0511   BUN 15 09/05/2021 1523   CREATININE 0.57 11/24/2021 0511   CALCIUM 8.9 11/24/2021 0511   PROT 6.4 (L) 11/16/2021 0511   PROT 7.3 09/05/2021 1523   ALBUMIN 3.2 (L) 11/16/2021 0511   ALBUMIN 4.6 09/05/2021 1523    AST 9 (L) 11/16/2021 0511   ALT 20 11/16/2021 0511   ALKPHOS 111 11/16/2021 0511   BILITOT 0.4 11/16/2021 0511   BILITOT 0.2 09/05/2021 1523   GFRNONAA >60 11/24/2021 0511   GFRAA 116 10/12/2020 1624   Lab Results  Component Value Date   CHOL 139 02/14/2021   HDL 70 02/14/2021   LDLCALC 56 02/14/2021   TRIG 66 02/14/2021   CHOLHDL 2.0 02/14/2021   Lab Results  Component Value Date   HGBA1C 6.4 (H) 11/01/2021   Lab Results  Component Value Date   VITAMINB12 312 11/20/2021   Lab Results  Component Value Date   TSH 0.458 02/14/2021     ASSESSMENT AND PLAN 61 y.o. year old female  has a past medical history of Allergies, Anemia, Arthritis, Asthma, Back pain, Chronic pain, Complication of anesthesia, Diabetes ([place]), Diabetes mellitus without complication ([place]), Edema, lower extremity, Fibroid, Fibromyalgia, GERD (gastroesophageal reflux disease), Headache, High blood pressure, History of hiatal hernia, History of stomach ulcers, IBS (irritable bowel syndrome), Joint pain, Sleep apnea, and Wears contact lenses. here with   No diagnosis found.    Taiyana A Delsanto is doing well on CPAP therapy. Compliance report reveals ***. *** was encouraged to continue using CPAP nightly and for greater than 4 hours each night. We will  update supply orders as indicated. Risks of untreated sleep apnea review and education materials provided. Healthy lifestyle habits encouraged. *** will follow up in ***, sooner if needed. *** verbalizes understanding and agreement with this plan.    No orders of the defined types were placed in this encounter.    No orders of the defined types were placed in this encounter.     Debbora Presto, FNP-C 12/27/2021, 4:05 PM [facility] [address] [place], [place] [address] [phone]

## 2021-12-27 NOTE — Patient Instructions (Incomplete)

## 2021-12-27 NOTE — Therapy (Signed)
?OUTPATIENT SPEECH LANGUAGE PATHOLOGY TREATMENT NOTE ? ? ?Patient Name: Virginia Luna ?MRN: [id number] ?DOB:Jan 20, 1961, 61 y.o., female ?Today's Date: 12/27/2021 ? ?PCP: Shirline Frees, MD ?REFERRING PROVIDER: Barbie Banner, PA-C  ? ?END OF SESSION:  ? End of Session - 12/27/21 0843   ? ? Visit Number 7   ? Number of Visits 25   ? Date for SLP Re-Evaluation 02/20/22   ? Authorization Type [employer]   ? Progress Note Due on Visit 10   ? SLP Start Time 0845   ? SLP Stop Time  0930   ? SLP Time Calculation (min) 45 min   ? Activity Tolerance Patient tolerated treatment well   ? ?  ?  ? ?  ? ? ? ? ? ? ?Past Medical History:  ?Diagnosis Date  ? Allergies   ? Anemia   ? Arthritis   ? Asthma   ? Back pain   ? Chronic pain   ? Complication of anesthesia   ? woke up during colonoscopy  ? Diabetes ([place])   ? Diabetes mellitus without complication ([place])   ? Edema, lower extremity   ? Fibroid   ? Fibromyalgia   ? Chronic  ? GERD (gastroesophageal reflux disease)   ? Headache   ? migraines  ? High blood pressure   ? History of hiatal hernia   ? History of stomach ulcers   ? IBS (irritable bowel syndrome)   ? Joint pain   ? Sleep apnea   ? did use cpap-lost 25lb-says she does not need it   NO CPAP  ? Wears contact lenses   ? ?Past Surgical History:  ?Procedure Laterality Date  ? BRAIN SURGERY Left 11/07/2021  ? [facility] Dr. Herschel [place]  ? BREAST BIOPSY    ? BREAST EXCISIONAL BIOPSY    ? BREAST LUMPECTOMY WITH RADIOACTIVE SEED LOCALIZATION Bilateral 11/24/2014  ? Procedure: BILATERAL BREAST LUMPECTOMY WITH RADIOACTIVE SEED LOCALIZATION;  Surgeon: [name], MD;  Location: [facility];  Service: General;  Laterality: Bilateral;  ? CHOLECYSTECTOMY    ? COLONOSCOPY    ? DILATION AND CURETTAGE OF UTERUS    ? ESOPHAGOGASTRODUODENOSCOPY (EGD) WITH PROPOFOL N/A 07/23/2016  ? Procedure: ESOPHAGOGASTRODUODENOSCOPY (EGD) WITH PROPOFOL;  Surgeon: Laurence Spates, MD;  [facility];  Service: Endoscopy;   Laterality: N/A;  ? ESOPHAGOGASTRODUODENOSCOPY (EGD) WITH PROPOFOL N/A 06/11/2018  ? Procedure: ESOPHAGOGASTRODUODENOSCOPY (EGD) WITH PROPOFOL;  Surgeon: Laurence Spates, MD;  Location: [facility];  Service: Endoscopy;  Laterality: N/A;  ? LUMBAR LAMINECTOMY  2010  ? ORIF WRIST FRACTURE Right 11/24/2020  ? Procedure: OPEN REDUCTION INTERNAL FIXATION RIGHT WRIST FRACTURE;  Surgeon: Renette Butters, MD;  Location: [facility];  Service: Orthopedics;  Laterality: Right;  ? UMBILICAL HERNIA REPAIR    ? age 32  ? UPPER GI ENDOSCOPY    ? ?Patient Active Problem List  ? Diagnosis Date Noted  ? Cerebellar tumor ([place]) 11/15/2021  ? Comorbid sleep-related hypoventilation 10/19/2021  ? Urinary incontinence/areflexic bladder 10/05/2021  ? OSA, unable to tolerate CPAP (vomited every night) 09/05/2021  ? Chronic pain syndrome 09/05/2021  ? Chronic nausea 09/05/2021  ? Neurogenic bladder, followed by Urology, instructed I&O cath BID 09/05/2021  ? Intolerance of continuous positive airway pressure (CPAP) ventilation 09/03/2021  ? Insomnia secondary to chronic pain 07/24/2021  ? Primary osteoarthritis of right knee 07/03/2021  ? Myofascial pain dysfunction syndrome 07/03/2021  ? Fibromyalgia 07/03/2021  ? Hyponatremia 07/03/2021  ? Primary osteoarthritis of left knee 07/03/2021  ?  Body mass index (BMI) 50.0-59.9, adult ([place]) 03/07/2021  ? Allergic rhinitis due to animal (cat) (dog) hair and dander 10/06/2020  ? Allergic rhinitis due to pollen 10/06/2020  ? Moderate persistent asthma, uncomplicated 47/04/6282  ? Cerebellar mass 10/06/2020  ? Paresthesia 05/27/2020  ? Diabetes mellitus ([place]) 05/25/2020  ? Hypertension associated with diabetes ([place]) 05/25/2020  ? Hyperlipidemia associated with type 2 diabetes mellitus ([place]) 05/25/2020  ? Vitamin D deficiency 05/25/2020  ? NAFLD (nonalcoholic fatty liver disease) 05/25/2020  ? Sciatica of left side 05/25/2020  ? ? ?ONSET DATE: March 2023 (surgery 11/07/2021) ? ?REFERRING DIAG: D49.6  (ICD-10-CM) - Cerebellar tumor ([place])  ? ?THERAPY DIAG:  ?Cognitive communication deficit ? ?Aphasia ? ?Dysarthria and anarthria ? ?SUBJECTIVE: "a little better today" ? ?PAIN:  ?Are you having pain? Yes ?NPRS scale: 8.5/10 ?Pain location: "everywhere" ? ?OBJECTIVE:  ? ?TODAY'S TREATMENT: ?SKILLED INTERVENTIONS:  ?12-27-21: Re-eduction provided on dysarthria strategies. Use of acronym SLOP. Demonstration from [place] then targeted practice with oral reading, sentence level. Mod-I during structured activity 90% of trials, does not carryover to spontaneous utterances. Pt reports not liking how she sounds.  ? ?12-26-21: Re-education provided on attention and memory compensations. Discussion on internal vs external memory strategies. Pt primary using external compensations, with reported success. Using calendar, planner, writing lists and notes, MyChart for doctors information. Education on internal memory strategies, practice with creating story. Pt able to recall presented information with 80% accuracy. Led pt through procedural discourse activity. Able to direct ST how she'd make favorite meal with occasional mod-A for clarity and details.  ? ?12-21-21: ST led pt through narrative discourse training, using NARNIA protocol. With this outline, pt able to tell x3 personal stories with increased details and clarity of narrative. Prior to initiation, given narrative prompt pt with vague response lacking content or important details relevant for comprehension of novel listener. Discussed  Pt reports continued successful implementation of calendar system to manage her schedule. She has created a routine of referring to planner/calendar daily to orient to days activities. Pt able to use calendar on phone to relay dates of x2 upcoming doctors appointments date/time with mod-I. Led pt through cognitive exercises regarding schedule management. 100% accuracy answering time/planning related questions with rare min-A. ST recommends pt ask  for information from Drs visits in writing or taking notes. Discussion on ability to use mychart to view information she may have forgotten.  ? ?PATIENT EDUCATION: ?Education details: see above ?Person educated: Patient ?Education method: Explanation, Demonstration, and Handouts ?Education comprehension: verbalized understanding, returned demonstration, and needs further education ?  ?  ?GOALS: ?Goals reviewed with patient? Yes ?  ?SHORT TERM GOALS: Target date: 12/27/2021 ?  ?Pt will complete standardized cognitive assessment to better appreciate reported cognitive deficits within first 2 sessions.  ?Baseline: 12-06-21, 12-12-21 ?Goal status: goal met ?  ?2.  Pt will verbalize and implement 2 attention and memory compensations to aid daily functioning given occasional min A over 2 sessions ?Baseline: 12-19-21; 12-26-21 ?Goal status: goal met ?  ?3.  Pt will demonstrate successful usage of anomia compensations, with occasional min-A, to make 5 minute mod complex conversation functional over 2 sessions ?Baseline: 12-21-21, 12-26-21 ?Goal status: goal met ?  ?4.  Pt will demonstrate dysarthria compensations on structured speech tasks with 80% accuracy given occasional min A over 2 sessions ?Baseline: 12-21-21, 12-26-21 ?Goal status: goal met ?  ?  ?LONG TERM GOALS: Target date: 02/21/2022 ?  ?Pt will verbalize x3 functional areas in which implementation  of attention/memory compensations/strategies have resulted in increased successful participation in desired activities.  ?Baseline:  ?Goal status: ongoing ?  ?2.  Pt will report completion of dysarthria HEP at least 1x/day (rec BID) over 1 week period with rare min-A.  ?Baseline:  ?Goal status: ongoing ?  ?3.  Pt will report improvement on CPIB by 3 points by d/c.  ?Baseline:  ?Goal status: ongoing ?  ?4.  Pt will utilize anomia compensations when word finding occurs in 20+ minute conversation with rare min A ?Baseline:  ?Goal status: ongoing ?  ?  ?ASSESSMENT: ?  ?CLINICAL  IMPRESSION: ?Patient is a 61 y.o. F who was seen today for cognitive communication deficits related to cognition, speech, and language s/p cerebellar tumor resection and CVA. Pt demonstrates significantly impacted processi

## 2021-12-27 NOTE — Therapy (Signed)
?OUTPATIENT OCCUPATIONAL THERAPY TREATMENT NOTE ? ? ?Patient Name: Virginia Luna ?MRN: [id number] ?DOB:[birth year], 61 y.o., female ?Today's Date: 12/27/2021 ? ?PCP: Virginia Frees, MD ?REFERRING PROVIDER: Dr.Lovorn/ Virginia Grill PA-C  ? ?END OF SESSION:  ? OT End of Session - 12/27/21 1007   ? ? Visit Number 8   ? Number of Visits 25   ? Date for OT Re-Evaluation 02/20/22   ? Authorization Type [employer]   ? Authorization - Visit Number 8   ? Authorization - Number of Visits 10   ? Progress Note Due on Visit 10   ? OT Start Time 0930   ? OT Stop Time 1010   ? OT Time Calculation (min) 40 min   ? Activity Tolerance Patient limited by pain;Patient tolerated treatment well   ? Behavior During Therapy [place] affect   ? ?  ?  ? ?  ? ? ? ? ? ? ?Past Medical History:  ?Diagnosis Date  ? Allergies   ? Anemia   ? Arthritis   ? Asthma   ? Back pain   ? Chronic pain   ? Complication of anesthesia   ? woke up during colonoscopy  ? Diabetes ([place])   ? Diabetes mellitus without complication ([place])   ? Edema, lower extremity   ? Fibroid   ? Fibromyalgia   ? Chronic  ? GERD (gastroesophageal reflux disease)   ? Headache   ? migraines  ? High blood pressure   ? History of hiatal hernia   ? History of stomach ulcers   ? IBS (irritable bowel syndrome)   ? Joint pain   ? Sleep apnea   ? did use cpap-lost 25lb-says she does not need it   NO CPAP  ? Wears contact lenses   ? ?Past Surgical History:  ?Procedure Laterality Date  ? BRAIN SURGERY Left 11/07/2021  ? [facility] Dr. Herschel Luna  ? BREAST BIOPSY    ? BREAST EXCISIONAL BIOPSY    ? BREAST LUMPECTOMY WITH RADIOACTIVE SEED LOCALIZATION Bilateral 11/24/2014  ? Procedure: BILATERAL BREAST LUMPECTOMY WITH RADIOACTIVE SEED LOCALIZATION;  Surgeon: Virginia Luna, MD;  Location: [facility];  Service: General;  Laterality: Bilateral;  ? CHOLECYSTECTOMY    ? COLONOSCOPY    ? DILATION AND CURETTAGE OF UTERUS    ? ESOPHAGOGASTRODUODENOSCOPY (EGD) WITH PROPOFOL N/A 07/23/2016   ? Procedure: ESOPHAGOGASTRODUODENOSCOPY (EGD) WITH PROPOFOL;  Surgeon: Virginia Spates, MD;  [facility];  Service: Endoscopy;  Laterality: N/A;  ? ESOPHAGOGASTRODUODENOSCOPY (EGD) WITH PROPOFOL N/A 06/11/2018  ? Procedure: ESOPHAGOGASTRODUODENOSCOPY (EGD) WITH PROPOFOL;  Surgeon: Virginia Spates, MD;  [facility];  Service: Endoscopy;  Laterality: N/A;  ? LUMBAR LAMINECTOMY  2010  ? ORIF WRIST FRACTURE Right 11/24/2020  ? Procedure: OPEN REDUCTION INTERNAL FIXATION RIGHT WRIST FRACTURE;  Surgeon: Virginia Butters, MD;  [facility];  Service: Orthopedics;  Laterality: Right;  ? UMBILICAL HERNIA REPAIR    ? age 58  ? UPPER GI ENDOSCOPY    ? ?Patient Active Problem List  ? Diagnosis Date Noted  ? Cerebellar tumor ([place]) 11/15/2021  ? Comorbid sleep-related hypoventilation 10/19/2021  ? Urinary incontinence/areflexic bladder 10/05/2021  ? OSA, unable to tolerate CPAP (vomited every night) 09/05/2021  ? Chronic pain syndrome 09/05/2021  ? Chronic nausea 09/05/2021  ? Neurogenic bladder, followed by Urology, instructed I&O cath BID 09/05/2021  ? Intolerance of continuous positive airway pressure (CPAP) ventilation 09/03/2021  ? Insomnia secondary to chronic pain 07/24/2021  ? Primary osteoarthritis  of right knee 07/03/2021  ? Myofascial pain dysfunction syndrome 07/03/2021  ? Fibromyalgia 07/03/2021  ? Hyponatremia 07/03/2021  ? Primary osteoarthritis of left knee 07/03/2021  ? Body mass index (BMI) 50.0-59.9, adult ([place]) 03/07/2021  ? Allergic rhinitis due to animal (cat) (dog) hair and dander 10/06/2020  ? Allergic rhinitis due to pollen 10/06/2020  ? Moderate persistent asthma, uncomplicated 69/62/9528  ? Cerebellar mass 10/06/2020  ? Paresthesia 05/27/2020  ? Diabetes mellitus ([place]) 05/25/2020  ? Hypertension associated with diabetes ([place]) 05/25/2020  ? Hyperlipidemia associated with type 2 diabetes mellitus ([place]) 05/25/2020  ? Vitamin D deficiency 05/25/2020  ? NAFLD (nonalcoholic fatty liver  disease) 05/25/2020  ? Sciatica of left side 05/25/2020  ? ? ?ONSET DATE: 11/07/21  ? ?REFERRING DIAG: cerebellar tumor, s/p craniotomy  ? ?THERAPY DIAG:  ?Frontal lobe and executive function deficit ? ?Attention and concentration deficit ? ?Other disturbances of skin sensation ? ?Muscle weakness (generalized) ? ?Other lack of coordination ? ?Other abnormalities of gait and mobility ? ?Unsteadiness on feet ? ? ?PERTINENT HISTORY: Virginia Luna is a 61 year old female who recently presented to hospitalized at [facility] complaining of headaches and tremors. 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 She was admitted 11/07/2021, underwent left suboccipital craniotomy, resection of cerebellar tumor. Postoperative MRI performed on 3/22 revealed a small area of diffusion restriction in the left cerebellum Pt was transferred to  [facility] 11/15/21 and  she was d/c home 11/24/21.PMH: arthritis, asthma, chronic pain, DM, fibromyalgia, myofascial pain syndrome, HTN, IBS, sleep apnea Brain ?  ?PRECAUTIONS: Fall, no driving ? ?SUBJECTIVE: "I did some ironing" ? ?PAIN:  ?Are you having pain? Yes: NPRS scale: 8.5/10 ?Pain location: head and neck, and legs (all over, generalized) ?Pain description: constant ?Aggravating factors: surgery, fibromyalgia, myofascial pain syndrome ?Relieving factors: meds ? ? ? ?OBJECTIVE:  ? ?TODAY'S TREATMENT:  ?12/27/21 ?Pt reported doing some of the exercises and reports doing ironing yesterday for her God son that went fairly well.  ? ?Pt reports still having someone there for supervision for dressing. ? ?Standing with Medium Peg Board with BUE with one at a time for placing pegs into board. Pt removed with one at a time. Pt copied pattern with 100% accuracy. Pt req'd min cues for strategies for ataxia and completed with min difficulty and drops. ?Moved to folding laundry in standing and patient had increase in difficulty  d/t requiring bilateral hands and not able to use unilateral support.  ? ?Tolerated standing for 12.5 minutes before requiring seated rest break. Pt was very tired and demonstrated fatigue at approx 9-10 minutes.  ? ?Continued finish folding laundry in sitting. Pt with limited ataxia with automatic and functional task. ? ?OT assisted patient to lobby with stand by assistance and assistance managing papers/phone. ? ? ? ?HOME EXERCISE PROGRAM: ?putty HEP, coordination HEP, sh flex supine (12/06/21); BUE HEP (12/14/21) ? ? ?GOALS: ?  ?  ?SHORT TERM GOALS: Target date: 12/26/2021 ?  ?I with HEP ? Goal status: ONGOING reports doing exercises but inconsistently, 12/27/21 ?  ?2.  Pt will perform dressing mod I in a reasonable amount of time ?Baseline: supervision, increased time required ?Goal status: GOAL MET pt reports completing dressing with supervision, 12/27/21 ?  ?3.  Pt will perform basic home management with min A. ?Baseline: dependent ?Goal status: GOAL MET reports doing ironing 12/27/21 ?  ?4.  Pt will perform simple cooking task with min a demonstrating  good safety awareness. ?Baseline: dependent ?Goal status:  ONGOING reports getting own snacks and using microwave but not making any meals at all 12/27/21 ?  ?5.  Pt will increase bilateral grip strength by 5 lbs for increased ease opening containers. ?Baseline RUE 21.1 lbs, LUE 14.9 lbs :  ?Goal status: GOAL MET RUE: 35.4   LUE 37.4 ?  ?6.  Pt will demonstrate ability to stand for functional/ ADL tasks x 15 mins without rest break and no LOB. ?Baseline: poor and inconsistent endurance ?Goal status:  ONGOING  tolerated standing for 12.5 minutes in clinic 12/27/21 ?  ?LONG TERM GOALS: Target date: 02/20/2022 ?  ?I with updated HEP. ?  ?Goal status: INITIAL ?  ?2.  Pt will perform all basic ADLS mod I ?Baseline: supervision ?Goal status: INITIAL ?  ?3.  Pt will perform basic home management modified I ?Baseline: dependent ?Goal status: INITIAL ?  ?4.  Pt will perform basic  cooking mod I demonstrating good safety awareness. ?Baseline: depdnent ?Goal status: INITIAL ?  ?5.  Pt will demonstrate improved fine motor coordination for ADLS as evidenced by decreasing 9 hole peg t

## 2021-12-28 ENCOUNTER — Ambulatory Visit: Payer: Medicare HMO | Admitting: Family Medicine

## 2021-12-28 ENCOUNTER — Encounter: Payer: Self-pay | Admitting: Family Medicine

## 2021-12-28 ENCOUNTER — Other Ambulatory Visit: Payer: Self-pay | Admitting: Neurology

## 2021-12-28 DIAGNOSIS — G4733 Obstructive sleep apnea (adult) (pediatric): Secondary | ICD-10-CM

## 2021-12-28 NOTE — Patient Instructions (Addendum)
Please continue using your CPAP regularly. While your insurance requires that you use CPAP at least 4 hours each night on 70% of the nights, I recommend, that you not skip any nights and use it throughout the night if you can. Getting used to CPAP and staying with the treatment long term does take time and patience and discipline. Untreated obstructive sleep apnea when it is moderate to severe can have an adverse impact on cardiovascular health and raise her risk for heart disease, arrhythmias, hypertension, congestive heart failure, stroke and diabetes. Untreated obstructive sleep apnea causes sleep disruption, nonrestorative sleep, and sleep deprivation. This can have an impact on your day to day functioning and cause daytime sleepiness and impairment of cognitive function, memory loss, mood disturbance, and problems focussing. Using CPAP regularly can improve these symptoms. ? ?I will order an overnight oxygen study to be performed at home when you are using your CPAP. This will make sure CPAP therapy is helping with improved oxygen levels when you sleep. Please let me know if you do not hear back to schedule in the next two weeks. I will aslo ask DME to check your machine to make sure it is operating correctly. Continue using CPAP every night.   ? ?Follow up in 4-6 months with Dr Brett Fairy. We may need to get you in for a titration study if oxygen stays low.  ?

## 2021-12-28 NOTE — Progress Notes (Signed)
? ? ?PATIENT: Virginia Luna ?DOB: 09-23-1960 ? ?REASON FOR VISIT: follow up ?HISTORY FROM: patient ? ?Chief Complaint  ?Patient presents with  ? Obstructive Sleep Apnea  ?  Rm 2, alone. Here for CPAP f/u. Pt reports using CPAP mostly all nights and states it is not registering usage.  Pt c/o of HA pn, possibly due to surgery.   ?  ? ?HISTORY OF PRESENT ILLNESS: ? ?01/01/22 ALL:  ?Virginia Luna is a 61 y.o. female here today for follow up for OSA on CPAP. She was seen in consult with Dr Brett Fairy 07/2021 for concerns of insomnia. PSG showed overall mild OSA with total AHI of 10.9/hr but with significant worsening in REM sleep (51.2/hr) with noted hypoxia. CPAP titration showed apnea well managed with set pressure of 13cmH20 using small Eson 2 nasal mask, however, hypoxia persistent up to 11cmH20 and did not include REM sleep. ONO advised after starting CPAP. She reports doing fairly well on CPAP. She states that she is using her machine every night for at least 4 hours, however, compliance report shows 87% daily usage and 17% four hour usage. She states that she has discussed concerns with DME but told that she needed to use her machine more. She is adamant that she is. She denies concerns with mask/supplies. She is not sure she feels any better when using CPAP. She states that she did not have a home ONO after starting CPAP.  ? ?She is s/p cerebellar tumor resection 11/15/2021 at [facility] in [place], Dr Herschel [place]. She presented with worsening headaches and tremor. She is followed locally by Dr Ronnald Ramp. Dr Tomi Likens sees her for management of headaches. Now on Botox and zonasimide added at last visit 12/29/2021. She continues outpatient rehab. She is ambulating with her walker.  ? ? ? ? ?HISTORY: (copied from Dr Dohmeier's previous note) ? ?Virginia Luna is a 61 y.o. AA female patient and was seen here as a new Consult patient , upon referral on 07/24/2021 from Dr Juleen China.  ?Chief concern according to patient :   Internal referral for insomnia due to mental conditions. The patient is followed for non sleep issues by [facility], her primary neurologist.  ?  ?The patietn weighted 289 pounds at her highest level and now is at 266 pounds. Her BMI remains at 50 . ?  ?I have the pleasure of seeing Virginia Luna 07-24-2021, a right-handed Black or [place] American female with chronic insomnia- related to  pain. Onset in her twenties,    She  has a past medical history of Allergic Rhinitis , Anemia, OSTEO_Arthritis, childhood and allergic Asthma, Chronic pain, Complication of anesthesia, Diabetes ([place]), Diabetes mellitus without complication ([place]), Edema, lower extremity, Fibroid, Fibromyalgia, GERD (gastroesophageal reflux disease), Headache, High blood pressure, History of hiatal hernia, History of stomach ulcers, IBS (irritable bowel syndrome), Joint pain, Sleep apnea, and Wears contact lenses. Fall injuries with fracture of right wrist  ?  ?The patient had the first sleep study in the year 2020  with a diagnosis of OSA, [facility].  she was placed on CPAP, but d/c after developing nausea. She reported aerophagia.  ?Nocturia/ Enuresis 2-3 times, Sleep walking and talking, no Tonsillectomy, cervical spine DDD, no surgery.   ? Family medical /sleep history:  Brother on CPAP with OSA prior to his bariatric surgery- , insomnia sleep walking in siblings. .  ?  ?Social history: Patient is disabled from [employer],  and lives in a household  alone. Has a constant companion.  ?Family status is single- never married- ?Tobacco use; none.  ETOH use none,  ?Caffeine intake in form of Coffee( /) Soda( /) Tea ( 2 week) or energy drinks. ?Regular exercise in form of walking with friends.  ?  ?Sleep habits are as follows: The patient's dinner time is between 6-6.30PM. The patient goes to bed at 9-10  PM and she struggles to sleep without medication ( this is a chronic condition and treated by Dr Kenton Kingfisher, attributed to  FIBROMYALGIA ) continues to sleep for 4-5  hours, wakes for several bathroom breaks   ?The preferred sleep position is supine, with the support of 2-3 pillows. Severe GERD causes arousals and sometimes she will vomit at night- constant nausea and feeling of fullness.  ?She wakes sometimes feeling paralyzed, scared by these spell.  ?Dreams are reportedly rare. Used to have nightmares.  ?5.30  AM is the usual rise time. The patient wakes up spontaneously 5 AM-before an alarm. She reports not feeling refreshed or restored in AM, with symptoms such as dry mouth, morning headaches, and residual fatigue. ? Naps are taken infrequently after lunch, lasting from 10 to 30 minutes and are more refreshing than nocturnal sleep.  ?  ? ?REVIEW OF SYSTEMS: Out of a complete 14 system review of symptoms, the patient complains only of the following symptoms, headaches, imbalance, tremor and all other reviewed systems are negative. ? ?ESS: 11/24 ? ?ALLERGIES: ?Allergies  ?Allergen Reactions  ? Rocephin [Ceftriaxone] Anaphylaxis  ? Morphine And Related Itching and Nausea And Vomiting  ?  Doesn't work  ? Prednisone Itching and Swelling  ? Sulfa Antibiotics Itching and Swelling  ? Amitriptyline Other (See Comments)  ? Penicillin G Sodium Other (See Comments)  ? ? ?HOME MEDICATIONS: ?Outpatient Medications Prior to Visit  ?Medication Sig Dispense Refill  ? acetaminophen (TYLENOL) 325 MG tablet Take 1-2 tablets (325-650 mg total) by mouth every 4 (four) hours as needed for mild pain.    ? amLODipine (NORVASC) 5 MG tablet Take 1 tablet (5 mg total) by mouth daily. 30 tablet 0  ? atorvastatin (LIPITOR) 10 MG tablet Take 1 tablet (10 mg total) by mouth at bedtime. 30 tablet 0  ? azelastine (ASTELIN) 0.1 % nasal spray Place 1 spray into both nostrils 2 (two) times daily as needed for rhinitis. 30 mL 0  ? levocetirizine (XYZAL) 5 MG tablet Take 1 tablet (5 mg total) by mouth every evening. 30 tablet 0  ? linaclotide (LINZESS) 72 MCG capsule  Take 1 capsule (72 mcg total) by mouth daily as needed (constipation). 90 capsule 0  ? meclizine (ANTIVERT) 25 MG tablet Take 1 tablet (25 mg total) by mouth 3 (three) times daily as needed for dizziness. 60 tablet 0  ? melatonin 5 MG TABS Take 1 tablet (5 mg total) by mouth at bedtime. 30 tablet 0  ? methocarbamol (ROBAXIN) 750 MG tablet Take 1 tablet (750 mg total) by mouth every 8 (eight) hours as needed for muscle spasms. 60 tablet 0  ? montelukast (SINGULAIR) 10 MG tablet Take 1 tablet (10 mg total) by mouth at bedtime. 30 tablet 0  ? ondansetron (ZOFRAN ODT) 4 MG disintegrating tablet Take 1 tablet (4 mg total) by mouth every 6 (six) hours as needed for nausea or vomiting. 20 tablet 0  ? ondansetron (ZOFRAN) 4 MG tablet Take 1 tablet (4 mg total) by mouth every 8 (eight) hours as needed for nausea or vomiting. 90 tablet 0  ?  Oxcarbazepine (TRILEPTAL) 300 MG tablet Take 3 tablets (900 mg total) by mouth at bedtime. 90 tablet 0  ? pantoprazole (PROTONIX) 40 MG tablet Take 1 tablet (40 mg total) by mouth daily. 30 tablet 0  ? polyethylene glycol (MIRALAX / GLYCOLAX) 17 g packet Take 17 g by mouth daily as needed for mild constipation. 14 each 0  ? QUEtiapine (SEROQUEL) 300 MG tablet Take 1 tablet (300 mg total) by mouth at bedtime. 30 tablet 0  ? Semaglutide,0.25 or 0.'5MG'$ /DOS, (OZEMPIC, 0.25 OR 0.5 MG/DOSE,) 2 MG/1.5ML SOPN Inject 0.5 mg into the skin once a week. (Patient taking differently: Inject 0.5 mg into the skin every Sunday.) 1.5 mL 0  ? senna-docusate (SENOKOT-S) 8.6-50 MG tablet Take 2 tablets by mouth at bedtime. 60 tablet 0  ? SUMAtriptan (IMITREX) 100 MG tablet Take 1 tablet (100 mg total) by mouth every 2 (two) hours as needed for migraine or headache (max 2x in 24 hour period). May repeat in 2 hours if headache persists or recurs. 10 tablet 0  ? Vitamin D, Ergocalciferol, (DRISDOL) 1.25 MG (50000 UNIT) CAPS capsule TAKE ONE CAPSULE BY MOUTH EVERY 7 DAYS (Patient taking differently: Take 50,000  Units by mouth every Sunday.) 4 capsule 0  ? zolpidem (AMBIEN) 10 MG tablet Take 1 tablet (10 mg total) by mouth at bedtime. 30 tablet 5  ? zonisamide (ZONEGRAN) 100 MG capsule Take 1 capsule (100 mg total) by mouth d

## 2021-12-29 ENCOUNTER — Telehealth (HOSPITAL_COMMUNITY): Payer: Self-pay | Admitting: Pharmacy Technician

## 2021-12-29 ENCOUNTER — Encounter: Payer: Self-pay | Admitting: Neurology

## 2021-12-29 ENCOUNTER — Ambulatory Visit: Payer: Medicare HMO | Admitting: Neurology

## 2021-12-29 ENCOUNTER — Other Ambulatory Visit (HOSPITAL_COMMUNITY): Payer: Self-pay

## 2021-12-29 VITALS — BP 144/91 | HR 96 | Ht 62.0 in | Wt 288.0 lb

## 2021-12-29 DIAGNOSIS — G43709 Chronic migraine without aura, not intractable, without status migrainosus: Secondary | ICD-10-CM | POA: Diagnosis not present

## 2021-12-29 DIAGNOSIS — D496 Neoplasm of unspecified behavior of brain: Secondary | ICD-10-CM

## 2021-12-29 MED ORDER — ZONISAMIDE 100 MG PO CAPS: 100.0000 mg | ORAL_CAPSULE | Freq: Every day | ORAL | 5 refills | Status: DC

## 2021-12-29 NOTE — Telephone Encounter (Signed)
-----   Message from Lenon Oms, CPhT sent at 12/29/2021 11:06 AM EDT ----- ?Regarding: RE: PA BOtox ?Good morning.  Happy Friday.  Will do ? ?----- Message ----- ?From: Venetia Night, CMA ?Sent: 12/29/2021   9:26 AM EDT ?To: Rx Prior Auth Team ?Subject: PA BOtox                                      ? ?Good Morning Team ?Happy Friday!! ?Please send a renewal for Botox PA.  ? ? ?

## 2021-12-29 NOTE — Patient Instructions (Signed)
Start zonisamide '100mg'$  daily ?Continue Botox ?Follow up 6 months. ?

## 2021-12-29 NOTE — Telephone Encounter (Signed)
Patient Advocate Encounter ?  ?Received notification that prior authorization for Botox 200UNIT solution is required. ?  ?PA submitted on 12/29/2021 ?Key [id number] ?Status is pending ?   ? ? ? ?Lyndel Safe, CPhT ?Pharmacy Patient Advocate Specialist ?[place] Patient Advocate Team ?Direct Number: [phone]  Fax: [phone]  ?

## 2022-01-01 ENCOUNTER — Other Ambulatory Visit (HOSPITAL_COMMUNITY): Payer: Self-pay | Admitting: Neurological Surgery

## 2022-01-01 ENCOUNTER — Ambulatory Visit: Payer: Medicare HMO

## 2022-01-01 ENCOUNTER — Ambulatory Visit
Admission: RE | Admit: 2022-01-01 | Discharge: 2022-01-01 | Disposition: A | Discharge status: Home / Self Care | Payer: Medicare HMO | Source: Ambulatory Visit | Attending: Family Medicine | Admitting: Family Medicine

## 2022-01-01 ENCOUNTER — Encounter: Payer: Self-pay | Admitting: Family Medicine

## 2022-01-01 ENCOUNTER — Ambulatory Visit (INDEPENDENT_AMBULATORY_CARE_PROVIDER_SITE_OTHER): Payer: Medicare HMO | Admitting: Family Medicine

## 2022-01-01 VITALS — BP 150/89 | HR 109 | Ht 62.0 in | Wt 283.5 lb

## 2022-01-01 DIAGNOSIS — G4733 Obstructive sleep apnea (adult) (pediatric): Secondary | ICD-10-CM

## 2022-01-01 DIAGNOSIS — Z9989 Dependence on other enabling machines and devices: Secondary | ICD-10-CM

## 2022-01-01 DIAGNOSIS — Z1231 Encounter for screening mammogram for malignant neoplasm of breast: Secondary | ICD-10-CM | POA: Diagnosis not present

## 2022-01-01 NOTE — Progress Notes (Signed)
Please review

## 2022-01-01 NOTE — Telephone Encounter (Signed)
Patient Advocate Encounter ? ?Prior Authorization for Botox 200UNIT solution has been approved.   ? ?PA# [id number] ?Effective dates: 12/29/2021 through 08/19/2022 ? ? ? ? ? ?Lyndel Safe, CPhT ?Pharmacy Patient Advocate Specialist ?[place] Patient Advocate Team ?Direct Number: [phone]  Fax: [phone]  ?

## 2022-01-01 NOTE — Progress Notes (Signed)
CM sent to [facility] for new order ?

## 2022-01-02 ENCOUNTER — Other Ambulatory Visit: Payer: Self-pay | Admitting: Physical Medicine and Rehabilitation

## 2022-01-02 ENCOUNTER — Encounter: Payer: Medicare HMO | Admitting: Occupational Therapy

## 2022-01-02 ENCOUNTER — Ambulatory Visit: Payer: Medicare HMO | Admitting: Physical Therapy

## 2022-01-03 DIAGNOSIS — J3089 Other allergic rhinitis: Secondary | ICD-10-CM | POA: Diagnosis not present

## 2022-01-03 DIAGNOSIS — J3081 Allergic rhinitis due to animal (cat) (dog) hair and dander: Secondary | ICD-10-CM | POA: Diagnosis not present

## 2022-01-03 DIAGNOSIS — J301 Allergic rhinitis due to pollen: Secondary | ICD-10-CM | POA: Diagnosis not present

## 2022-01-04 ENCOUNTER — Ambulatory Visit: Payer: Medicare HMO | Admitting: Physical Therapy

## 2022-01-04 ENCOUNTER — Ambulatory Visit: Payer: Medicare HMO | Admitting: Occupational Therapy

## 2022-01-04 ENCOUNTER — Ambulatory Visit: Payer: Medicare HMO | Admitting: Speech Pathology

## 2022-01-04 ENCOUNTER — Encounter: Payer: Self-pay | Admitting: Physical Therapy

## 2022-01-04 VITALS — BP 124/90 | HR 98

## 2022-01-04 DIAGNOSIS — J301 Allergic rhinitis due to pollen: Secondary | ICD-10-CM | POA: Diagnosis not present

## 2022-01-04 DIAGNOSIS — J3081 Allergic rhinitis due to animal (cat) (dog) hair and dander: Secondary | ICD-10-CM | POA: Diagnosis not present

## 2022-01-04 DIAGNOSIS — R208 Other disturbances of skin sensation: Secondary | ICD-10-CM

## 2022-01-04 DIAGNOSIS — R278 Other lack of coordination: Secondary | ICD-10-CM | POA: Diagnosis not present

## 2022-01-04 DIAGNOSIS — R41844 Frontal lobe and executive function deficit: Secondary | ICD-10-CM | POA: Diagnosis not present

## 2022-01-04 DIAGNOSIS — R2681 Unsteadiness on feet: Secondary | ICD-10-CM | POA: Diagnosis not present

## 2022-01-04 DIAGNOSIS — R4701 Aphasia: Secondary | ICD-10-CM

## 2022-01-04 DIAGNOSIS — R2689 Other abnormalities of gait and mobility: Secondary | ICD-10-CM

## 2022-01-04 DIAGNOSIS — M6281 Muscle weakness (generalized): Secondary | ICD-10-CM | POA: Diagnosis not present

## 2022-01-04 DIAGNOSIS — R41840 Attention and concentration deficit: Secondary | ICD-10-CM | POA: Diagnosis not present

## 2022-01-04 DIAGNOSIS — R471 Dysarthria and anarthria: Secondary | ICD-10-CM | POA: Diagnosis not present

## 2022-01-04 DIAGNOSIS — R41841 Cognitive communication deficit: Secondary | ICD-10-CM

## 2022-01-04 DIAGNOSIS — J3089 Other allergic rhinitis: Secondary | ICD-10-CM | POA: Diagnosis not present

## 2022-01-04 NOTE — Therapy (Signed)
OUTPATIENT PHYSICAL THERAPY TREATMENT NOTE   Patient Name: Virginia Luna MRN: [id number] DOB:[birth year], 61 y.o., female Today's Date: 01/04/2022  PCP: Shirline Frees, MD REFERRING PROVIDER: Courtney Heys, MD    END OF SESSION:   PT End of Session - 01/04/22 0935     Visit Number 7    Number of Visits 13    Date for PT Re-Evaluation 02/23/22    Authorization Type [employer] MEDICARE HMO    PT Start Time [phone]    PT Stop Time 1016    PT Time Calculation (min) 45 min    Equipment Utilized During Treatment Gait belt    Activity Tolerance Patient tolerated treatment well    Behavior During Therapy WFL for tasks assessed/performed              Past Medical History:  Diagnosis Date   Allergies    Anemia    Arthritis    Asthma    Back pain    Chronic pain    Complication of anesthesia    woke up during colonoscopy   Diabetes ([place])    Diabetes mellitus without complication (H[facility])    Edema, lower extremity    Fibroid    Fibromyalgia    Chronic   GERD (gastroesophageal reflux disease)    Headache    migraines   High blood pressure    History of hiatal hernia    History of stomach ulcers    IBS (irritable bowel syndrome)    Joint pain    Sleep apnea    did use cpap-lost 25lb-says she does not need it   NO CPAP   Wears contact lenses    Past Surgical History:  Procedure Laterality Date   BRAIN SURGERY Left 11/07/2021   [facility] Dr. Herschel Senegal   BREAST BIOPSY     BREAST EXCISIONAL BIOPSY     BREAST LUMPECTOMY WITH RADIOACTIVE SEED LOCALIZATION Bilateral 11/24/2014   Procedure: BILATERAL BREAST LUMPECTOMY WITH RADIOACTIVE SEED LOCALIZATION;  Surgeon: [name], MD;  Location: [facility];  Service: General;  Laterality: Bilateral;   CHOLECYSTECTOMY     COLONOSCOPY     DILATION AND CURETTAGE OF UTERUS     ESOPHAGOGASTRODUODENOSCOPY (EGD) WITH PROPOFOL N/A 07/23/2016   Procedure: ESOPHAGOGASTRODUODENOSCOPY (EGD) WITH PROPOFOL;  Surgeon: Laurence Spates, MD;  Location: [facility];  Service: Endoscopy;  Laterality: N/A;   ESOPHAGOGASTRODUODENOSCOPY (EGD) WITH PROPOFOL N/A 06/11/2018   Procedure: ESOPHAGOGASTRODUODENOSCOPY (EGD) WITH PROPOFOL;  Surgeon: Laurence Spates, MD;  Location: [facility];  Service: Endoscopy;  Laterality: N/A;   LUMBAR LAMINECTOMY  2010   ORIF WRIST FRACTURE Right 11/24/2020   Procedure: OPEN REDUCTION INTERNAL FIXATION RIGHT WRIST FRACTURE;  Surgeon: Renette Butters, MD;  Location: [facility];  Service: Orthopedics;  Laterality: Right;   UMBILICAL HERNIA REPAIR     age 41   UPPER GI ENDOSCOPY     Patient Active Problem List   Diagnosis Date Noted   Cerebellar tumor ([place]) 11/15/2021   Comorbid sleep-related hypoventilation 10/19/2021   Urinary incontinence/areflexic bladder 10/05/2021   OSA, unable to tolerate CPAP (vomited every night) 09/05/2021   Chronic pain syndrome 09/05/2021   Chronic nausea 09/05/2021   Neurogenic bladder, followed by Urology, instructed I&O cath BID 09/05/2021   Intolerance of continuous positive airway pressure (CPAP) ventilation 09/03/2021   Insomnia secondary to chronic pain 07/24/2021   Primary osteoarthritis of right knee 07/03/2021   Myofascial pain dysfunction syndrome 07/03/2021   Fibromyalgia 07/03/2021   Hyponatremia 07/03/2021  Primary osteoarthritis of left knee 07/03/2021   Body mass index (BMI) 50.0-59.9, adult ([place]) 03/07/2021   Allergic rhinitis due to animal (cat) (dog) hair and dander 10/06/2020   Allergic rhinitis due to pollen 10/06/2020   Moderate persistent asthma, uncomplicated 02/72/5366   Cerebellar mass 10/06/2020   Paresthesia 05/27/2020   Diabetes mellitus ([place]) 05/25/2020   Hypertension associated with diabetes ([place]) 05/25/2020   Hyperlipidemia associated with type 2 diabetes mellitus ([place]) 05/25/2020   Vitamin D deficiency 05/25/2020   NAFLD (nonalcoholic fatty liver disease) 05/25/2020   Sciatica of left side 05/25/2020    REFERRING DIAG:  D49.6 (ICD-10-CM) - Neoplasm of unspecified behavior of brain    THERAPY DIAG:  Other lack of coordination  Other abnormalities of gait and mobility  Unsteadiness on feet  Muscle weakness (generalized)  PERTINENT HISTORY: From H&P note on 11/15/2021:  "On the day of admission 11/07/2021, the patient was taken to the operating room by Dr. Recardo Evangelist of neurosurgery and underwent left suboccipital craniotomy, resection of cerebellar tumor. She tolerated the procedure well. Postoperative MRI performed on 3/22 revealed a small area of diffusion restriction in the left cerebellum."  Patient transferred to [facility] on 11/15/2021, discharged 11/24/2021 per pt.  fibromyalgia, DM2, asthma, HTN, OSA, migraines, bilateral knee OA, myofascial pain syndrome    PRECAUTIONS: Fall  SUBJECTIVE: Pt states she was kicked out of prior living arrangement with Caryl Pina Designer, industrial/product) and then Ashley's mother kicked her out because they could not keep looking after her.  She is living back at home with Kennith Center (boyfriend) who is helping her when he is not at work (gets off at [employer]).  She does not move around much when he is working, but will go to the bathroom.  Kennith Center helps her get to bed and washing up if she needs it, etc.  Showerhead is not ready yet so her bathing has been modified since being home until Kennith Center gets the showerhead switched out so she can put her bath chair back in there.  PAIN:  She endorses same pain from prior session. Are you having pain? Yes: NPRS scale: 8/10 Pain location: All over (fibromyalgia), worse in head and neck and BLE (LLE worse)  - she feels a cutting-like sensation in the left posterior part of her head not quite over the incision (incision intact and pink) Pt becomes tearful while discussing prior pain management discussion with MD stating they feel she is managing with her high pain levels well and cannot up her dosages because of the surgery, but she is not doing well.  She states "I  do not feel like I want to kill myself, but I do not care if I wake up."  PT inquires about access to therapy/psychologic counseling services, pt feels her current psychologist does not take her concerns seriously unless she wanted to plan to kill herself which she does not.  Discussed pursuing other counseling options to help manage her current mental status as well as continuance of therapy.    OBJECTIVE:    DIAGNOSTIC FINDINGS: From Brain MRI on 09/02/2021:  "Increased size of left superior cerebellar vermis mass, now measuring 1.5 x 1.5 cm, previously 1.1 x 1.0 cm. Increased mild surrounding edema."  Future brain MRI ordered w/o date visible at this time.   COGNITION: Overall cognitive status: No family/caregiver present to determine baseline cognitive functioning - pt states "I feel stupid", she elaborates that she is having a hard time processing.     TODAY'S TREATMENT:  Gait  100' + 230' w/ RW +230' w/ rollator, close Supervision w/ RW, CGA w/ rollator due to inc forward lean and audible dyspnea during ambulation, min verbal cuing for improved posture w/ rollator.  As pt fatigued, AD management required increased multimodal cuing.  Toe drag improved mildly this visit.  THEREX Pt performs SciFit set on hills L2 x8 mins for dynamic warmup, reciprocal motion, and strengthening.  Mod cues for continuance of large amplitude movements.    PATIENT EDUCATION: Education details: Discussed reaching out to referring provider regarding pt feelings and prior discussion. Person educated: Patient Education method: Explanation  Education comprehension: verbalized understanding and needs further education     HOME EXERCISE PROGRAM: Access Code: [email or web address] URL: [email or web address] Date: 12/06/2021 Prepared by: Mickie Bail Plaster  Exercises - Sit to Stand Without Arm Support  - 1 x daily - 7 x weekly - 3 sets - 10 reps - Standing March with Counter Support  - 1 x daily - 7 x weekly - 3  sets - 10 reps - Chin Tuck  - 1 x daily - 7 x weekly - 3 sets - 10 reps - Seated Cervical Sidebending Stretch  - 1 x daily - 7 x weekly - 3 sets - 10 reps  - Supine Bridge  - 1 x daily - 4 x weekly - 3 sets - 10 reps - Supine Active Straight Leg Raise  - 1 x daily - 4 x weekly - 2 sets - 8 reps - Forward and Backward Monster Walk with Counter Support  - 1 x daily - 4 x weekly - 3 sets - 10 reps - Seated Heel Raise  - 1 x daily - 4 x weekly - 2 sets - 12 reps   GOALS: Goals reviewed with patient? Yes   SHORT TERM GOALS: Target date:  01/12/2022   Pt will be independent with initial strength and balance HEP with supervision from family. Baseline:  To be established. Goal status: INITIAL   2.  Pt will decrease 5xSTS to <35 seconds w/ BUE support in order to demonstrate decreased risk for falls and improved functional bilateral LE strength and power. Baseline: 38.51sec w/ BUE Goal status: INITIAL   3.  Pt will demonstrate a gait speed of >1 feet/sec in order to decrease risk for falls. Baseline: 0.76 ft/sec Goal status: INITIAL   4.  Pt will improve normal TUG to less than or equal to 30 seconds w/LRAD for improved functional mobility and decreased fall risk.  Baseline: 34.87s w/RW and S* Goal status: INITIAL   5.  Pt will improve Berg score to 41/56 for decreased fall risk  Baseline: 35/56 on 4/19 Goal status: INITIAL   LONG TERM GOALS: Target date:  02/23/2022   Pt will report return to some form of aerobic activity with maintenance of advanced HEP for strength and balance. Baseline: To be established Goal status: INITIAL   2.  Pt will decrease 5xSTS to <30 seconds w/ BUE support in order to demonstrate decreased risk for falls and improved functional bilateral LE strength and power. Baseline: 38.51 sec w/ BUE support Goal status: INITIAL   3.  Pt will demonstrate a gait speed of >1.3 feet/sec in order to decrease risk for falls. Baseline: 0.76 ft/sec Goal status: INITIAL    4.  Pt will improve normal TUG to less than or equal to 25 seconds w/LRAD for improved functional mobility and decreased fall risk.  Baseline: 34.87s w/RW and S* Goal status: INITIAL   5.  Pt will improve Berg score to 48/56 for decreased fall risk  Baseline: 35/56 on 4/19 Goal status: INITIAL   6.  Pt will tolerate >68mns of continuous activity in order to promote activity tolerance and endurance. Baseline: Pt extremely fatigued during session, nodding off intermittently. Goal status: INITIAL   ASSESSMENT:   CLINICAL IMPRESSION: PT provides therapeutic listening at onset of session due to pt expressing concerns around pain management and validity of her feelings.  She was able to resume session with SciFit on hill setting for strengthening with good tolerance for this mode.  Progressed to gait training with RW vs rollator w/ rollator a bit difficult for self-management when fatigued.  Pt is able to use brakes effectively, but has increased forward trunk lean and allow device to pull her forward rather than controlling device.  Will continue to assess as pt progresses with strengthening, endurance, and balance.     OBJECTIVE IMPAIRMENTS Abnormal gait, decreased activity tolerance, decreased balance, decreased cognition, decreased coordination, decreased endurance, decreased knowledge of condition, decreased knowledge of use of DME, decreased mobility, difficulty walking, decreased ROM, decreased strength, and decreased safety awareness.    ACTIVITY LIMITATIONS cleaning, community activity, driving, meal prep, laundry, and shopping.    PERSONAL FACTORS Age, Behavior pattern, Fitness, and 3+ comorbidities: fibromyalgia, DM2, asthma, HTN, OSA, migraines, bilateral knee OA, myofascial pain syndrome  are also affecting patient's functional outcome.      REHAB POTENTIAL: Fair hx of fibromyalgia, DM2, asthma, HTN, OSA, migraines, bilateral knee OA, myofascial pain syndrome , personal factors    CLINICAL DECISION MAKING: Evolving/moderate complexity   EVALUATION COMPLEXITY: Moderate   PLAN: PT FREQUENCY: 1x/week   PT DURATION: 12 weeks   PLANNED INTERVENTIONS: Therapeutic exercises, Therapeutic activity, Neuromuscular re-education, Balance training, Gait training, Patient/Family education, Joint mobilization, Stair training, Vestibular training, DME instructions, Manual therapy, and self care and home management   PLAN FOR NEXT SESSION: Assess STGs!  Use SciFit for endurance and strengthening w/ BUE/BLE.  Gait w/ rollator vs RW-did pt bring rollator?  Add to/revise HEP as needed. Single leg stability-on pillow (add to HEP), endurance, BLE strength,  monster walks-add resistance, try progressing heel raises to standing, gait training w/ RW management, toe taps 6", stair step ups 4">6" step, quad strengthening, work on backwards walking, reaching outside BOS-forward reach to floor, lateral stepping     MBary Richard PT, DPT 01/04/2022, 12:44 PM

## 2022-01-04 NOTE — Therapy (Signed)
OUTPATIENT OCCUPATIONAL THERAPY TREATMENT NOTE   Patient Name: Virginia Luna MRN: [id number] DOB:1961/02/24, 61 y.o., female Today's Date: 01/04/2022  PCP: Virginia Frees, MD REFERRING PROVIDER: Dr.Lovorn/ Virginia Grill PA-C   END OF SESSION:   OT End of Session - 01/04/22 0836     Visit Number 9    Number of Visits 25    Date for OT Re-Evaluation 02/20/22    Authorization Type [employer]    Authorization - Visit Number 9    Authorization - Number of Visits 10    Progress Note Due on Visit 10    OT Start Time 0845    OT Stop Time 0928    OT Time Calculation (min) 43 min    Activity Tolerance Patient limited by pain;Patient tolerated treatment well    Behavior During Therapy Flat affect                 Past Medical History:  Diagnosis Date   Allergies    Anemia    Arthritis    Asthma    Back pain    Chronic pain    Complication of anesthesia    woke up during colonoscopy   Diabetes ([place])    Diabetes mellitus without complication (HCC)    Edema, lower extremity    Fibroid    Fibromyalgia    Chronic   GERD (gastroesophageal reflux disease)    Headache    migraines   High blood pressure    History of hiatal hernia    History of stomach ulcers    IBS (irritable bowel syndrome)    Joint pain    Sleep apnea    did use cpap-lost 25lb-says she does not need it   NO CPAP   Wears contact lenses    Past Surgical History:  Procedure Laterality Date   BRAIN SURGERY Left 11/07/2021   [facility] Dr. Herschel Luna   BREAST BIOPSY     BREAST EXCISIONAL BIOPSY     BREAST LUMPECTOMY WITH RADIOACTIVE SEED LOCALIZATION Bilateral 11/24/2014   Procedure: BILATERAL BREAST LUMPECTOMY WITH RADIOACTIVE SEED LOCALIZATION;  Surgeon: Virginia Luna, MD;  Location: [place];  Service: General;  Laterality: Bilateral;   CHOLECYSTECTOMY     COLONOSCOPY     DILATION AND CURETTAGE OF UTERUS     ESOPHAGOGASTRODUODENOSCOPY (EGD) WITH PROPOFOL N/A 07/23/2016    Procedure: ESOPHAGOGASTRODUODENOSCOPY (EGD) WITH PROPOFOL;  Surgeon: Virginia Spates, MD;  [facility];  Service: Endoscopy;  Laterality: N/A;   ESOPHAGOGASTRODUODENOSCOPY (EGD) WITH PROPOFOL N/A 06/11/2018   Procedure: ESOPHAGOGASTRODUODENOSCOPY (EGD) WITH PROPOFOL;  Surgeon: Virginia Spates, MD;  [facility];  Service: Endoscopy;  Laterality: N/A;   LUMBAR LAMINECTOMY  2010   ORIF WRIST FRACTURE Right 11/24/2020   Procedure: OPEN REDUCTION INTERNAL FIXATION RIGHT WRIST FRACTURE;  Surgeon: Virginia Butters, MD;  Location: [facility];  Service: Orthopedics;  Laterality: Right;   UMBILICAL HERNIA REPAIR     age 61   UPPER GI ENDOSCOPY     Patient Active Problem List   Diagnosis Date Noted   Cerebellar tumor ([place]) 11/15/2021   Comorbid sleep-related hypoventilation 10/19/2021   Urinary incontinence/areflexic bladder 10/05/2021   OSA, unable to tolerate CPAP (vomited every night) 09/05/2021   Chronic pain syndrome 09/05/2021   Chronic nausea 09/05/2021   Neurogenic bladder, followed by Urology, instructed I&O cath BID 09/05/2021   Intolerance of continuous positive airway pressure (CPAP) ventilation 09/03/2021   Insomnia secondary to chronic pain 07/24/2021   Primary osteoarthritis  of right knee 07/03/2021   Myofascial pain dysfunction syndrome 07/03/2021   Fibromyalgia 07/03/2021   Hyponatremia 07/03/2021   Primary osteoarthritis of left knee 07/03/2021   Body mass index (BMI) 50.0-59.9, adult ([place]) 03/07/2021   Allergic rhinitis due to animal (cat) (dog) hair and dander 10/06/2020   Allergic rhinitis due to pollen 10/06/2020   Moderate persistent asthma, uncomplicated 27/78/2423   Cerebellar mass 10/06/2020   Paresthesia 05/27/2020   Diabetes mellitus ([place]) 05/25/2020   Hypertension associated with diabetes ([place]) 05/25/2020   Hyperlipidemia associated with type 2 diabetes mellitus ([place]) 05/25/2020   Vitamin D deficiency 05/25/2020   NAFLD (nonalcoholic fatty liver  disease) 05/25/2020   Sciatica of left side 05/25/2020    ONSET DATE: 11/07/21   REFERRING DIAG: cerebellar tumor, s/p craniotomy   THERAPY DIAG:  Attention and concentration deficit  Frontal lobe and executive function deficit  Other disturbances of skin sensation  Muscle weakness (generalized)  Other lack of coordination  Other abnormalities of gait and mobility  Unsteadiness on feet   PERTINENT HISTORY: Virginia Luna is a 61 year old female who recently presented to hospitalized at [facility] complaining of headaches and tremors. 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 She was admitted 11/07/2021, underwent left suboccipital craniotomy, resection of cerebellar tumor. Postoperative MRI performed on 3/22 revealed a small area of diffusion restriction in the left cerebellum Pt was transferred to  [facility] 11/15/21 and  she was d/c home 11/24/21.PMH: arthritis, asthma, chronic pain, DM, fibromyalgia, myofascial pain syndrome, HTN, IBS, sleep apnea Brain   PRECAUTIONS: Fall, no driving  SUBJECTIVE: "I did some ironing"  PAIN:  Are you having pain? Yes: NPRS scale: 8/10 Pain location: head and neck, and legs (all over, generalized) Pain description: constant Aggravating factors: surgery, fibromyalgia, myofascial pain syndrome Relieving factors: meds    OBJECTIVE:   TODAY'S TREATMENT:  Simple cooking task using rollator for mobility and to gather items Pt gathered ingredients from fridge and carried on rollator seat, close supervision and min-mod v.c for walker safety Standing at countertop pt cracked egg into a bowl, and placed in frying pan, pt sat on rollator seat, pt requires verbal cues for safety to lock rollator and to hold onto rollator with both hands prior to sitting. Pt was able to scramble egg in a seated position, with supervision, therapist provided pt with a plate for the cooked egg.   Pt turned off the stove without cueing. She slid items along countertop to place in dishwasher, mod v.c for safety Pt was instructed that she must have direct supervision for any cooking tasks at home and that she will need assistance. Seated functional reach with left and right UE's to place and remove graded clothespins from vertical antennae, min difficulty, v.c due to ataxia.    HOME EXERCISE PROGRAM: putty HEP, coordination HEP, sh flex supine (12/06/21); BUE HEP (12/14/21)   GOALS:     SHORT TERM GOALS: Target date: 12/26/2021   I with HEP  Goal status: ONGOING reports doing exercises but inconsistently, 12/27/21   2.  Pt will perform dressing mod I in a reasonable amount of time Baseline: supervision, increased time required Goal status: GOAL MET pt reports completing dressing with supervision, 12/27/21   3.  Pt will perform basic home management with min A. Baseline: dependent Goal status: GOAL MET reports doing ironing 12/27/21   4.  Pt will perform simple cooking task with min a demonstrating good  safety awareness. Baseline: dependent Goal status:  ongoing, min A in clinic, min-mod v.c for walker safety   5.  Pt will increase bilateral grip strength by 5 lbs for increased ease opening containers. Baseline RUE 21.1 lbs, LUE 14.9 lbs :  Goal status: GOAL MET RUE: 35.4   LUE 37.4   6.  Pt will demonstrate ability to stand for functional/ ADL tasks x 15 mins without rest break and no LOB. Baseline: poor and inconsistent endurance Goal status:  ONGOING  tolerated standing for 12.5 minutes in clinic 12/27/21   LONG TERM GOALS: Target date: 02/20/2022   I with updated HEP.   Goal status: INITIAL   2.  Pt will perform all basic ADLS mod I Baseline: supervision Goal status: INITIAL   3.  Pt will perform basic home management modified I Baseline: dependent Goal status: INITIAL   4.  Pt will perform basic cooking mod I demonstrating good safety awareness. Baseline:  depdnent Goal status: INITIAL   5.  Pt will demonstrate improved fine motor coordination for ADLS as evidenced by decreasing 9 hole peg test score by 3 secs, bilaterally. Baseline: R 35.06, L 38.53 Goal status: INITIAL   6.  Pt will demonstrate ability to retrieve a 3 lbs weight from overhead shelf with left and right UE's individually demonstrating good control. Baseline: decreased bilateral strength and control. Goal status: INITIAL   ASSESSMENT:   CLINICAL IMPRESSION:  Pt is slowly progressing towards goals.  Pt with good carryover of strategies for ataxia.    PERFORMANCE DEFICITS in functional skills including ADLs, IADLs, coordination, dexterity, sensation, ROM, strength, pain, fascial restrictions, flexibility, FMC, GMC, mobility, balance, endurance, decreased knowledge of precautions, decreased knowledge of use of DME, vision, and UE functional use, cognitive skills including attention, energy/drive, learn, memory, problem solving, safety awareness, temperament/personality, thought, and understand, and psychosocial skills including coping strategies, environmental adaptation, and routines and behaviors. Pt can benefit from skilled OT to address these deficits.   IMPAIRMENTS are limiting patient from ADLs, IADLs, rest and sleep, play, leisure, and social participation.    COMORBIDITIES may have co-morbidities  that affects occupational performance. Patient will benefit from skilled OT to address above impairments and improve overall function.   MODIFICATION OR ASSISTANCE TO COMPLETE EVALUATION: No modification of tasks or assist necessary to complete an evaluation.   OT OCCUPATIONAL PROFILE AND HISTORY: Detailed assessment: Review of records and additional review of physical, cognitive, psychosocial history related to current functional performance.   CLINICAL DECISION MAKING: LOW - limited treatment options, no task modification necessary   REHAB POTENTIAL: Good   EVALUATION  COMPLEXITY: Low     PLAN: OT FREQUENCY: 2x/week   OT DURATION: 12 weeks plus eval   PLANNED INTERVENTIONS: self care/ADL training, therapeutic exercise, therapeutic activity, neuromuscular re-education, manual therapy, manual lymph drainage, passive range of motion, gait training, balance training, functional mobility training, aquatic therapy, splinting, electrical stimulation, ultrasound, paraffin, fluidotherapy, moist heat, cryotherapy, contrast bath, patient/family education, cognitive remediation/compensation, visual/perceptual remediation/compensation, energy conservation, coping strategies training, and DME and/or AE instructions   RECOMMENDED OTHER SERVICES: n/a   CONSULTED AND AGREED WITH PLAN OF CARE: Patient   PLAN FOR NEXT SESSION:  review HEPs, standing and functional reach, simple home management, pt has ataxia R>L     Phill Steck, OTR/L               01/04/2022, 8:40 AM

## 2022-01-04 NOTE — Patient Instructions (Addendum)
Ways You Can Compensate for Dysarthria  There are many strategies that can help you communicate effectively with dysarthria. The checked strategies may be the best for you.  For the Speaker: _____ Speak loudly and slowly. Concentrate on speaking clearly and loudly. Sometimes Speech Pathologists call this overarticulation. You can think about exaggerating your mouth movements. The extra effort will help slow your speech and help others to hear you better.  _____ Modify your environment. Make the environment quiet by turning off the TV and radio. Make sure the listener can see your face--this helps the listener to receive non-verbal cues.  _____ Ask the listener for help. Let the listener that it's okay to ask you to repeat and to tell you when they don't understand. _____ Provide a topic of conversation. Letting the listener know what you are talking about will help them to "fill in the gaps".  _____ Don't shift topics quickly. Use transitions.  _____ Get your listener's attention before talking. _____ Use short and simple sentences. Short, simple sentences are easier to understand. You may also you predictable sentences to increase understanding.  _____ Avoid communicating over long distances.  _____ Use communication aids or alternate methods of communication. Consider texting, writing, drawing picture of hard to produce words. _____ Keep an alphabet board and use it to point to letters to spell hard to understand words.  _____ Write down parts of your message using a whiteboard.  _____ Remember SLOB.  Slow, Loud, Overarticulate, Breathe.   For the Listener: _____ Let the speaker know when you do not understand. In public you can even use a signal. _____ Repeat the part of the message that you have understood. Then the speaker does not have to say the entire message again. _____ Give prompts. Remind the speaker of their strategies. (For example: "Say it slower" or "Please say that  louder.") _____ Ask yes / no questions to help.

## 2022-01-04 NOTE — Therapy (Signed)
OUTPATIENT SPEECH LANGUAGE PATHOLOGY TREATMENT NOTE   Patient Name: Virginia Luna MRN: [id number] DOB:1960-10-15, 61 y.o., female Today's Date: 01/04/2022  PCP: Shirline Frees, MD REFERRING PROVIDER: Barbie Banner, PA-C   END OF SESSION:   End of Session - 01/04/22 0805     Visit Number 8    Number of Visits 25    Date for SLP Re-Evaluation 02/20/22    Authorization Type [employer]    Progress Note Due on Visit 10    SLP Start Time 0805    SLP Stop Time  0845    SLP Time Calculation (min) 40 min    Activity Tolerance Patient tolerated treatment well                 Past Medical History:  Diagnosis Date   Allergies    Anemia    Arthritis    Asthma    Back pain    Chronic pain    Complication of anesthesia    woke up during colonoscopy   Diabetes ([place])    Diabetes mellitus without complication (HCC)    Edema, lower extremity    Fibroid    Fibromyalgia    Chronic   GERD (gastroesophageal reflux disease)    Headache    migraines   High blood pressure    History of hiatal hernia    History of stomach ulcers    IBS (irritable bowel syndrome)    Joint pain    Sleep apnea    did use cpap-lost 25lb-says she does not need it   NO CPAP   Wears contact lenses    Past Surgical History:  Procedure Laterality Date   BRAIN SURGERY Left 11/07/2021   [facility] Dr. Herschel [place]   BREAST BIOPSY     BREAST EXCISIONAL BIOPSY     BREAST LUMPECTOMY WITH RADIOACTIVE SEED LOCALIZATION Bilateral 11/24/2014   Procedure: BILATERAL BREAST LUMPECTOMY WITH RADIOACTIVE SEED LOCALIZATION;  Surgeon: [name], MD;  Location: [facility];  Service: General;  Laterality: Bilateral;   CHOLECYSTECTOMY     COLONOSCOPY     DILATION AND CURETTAGE OF UTERUS     ESOPHAGOGASTRODUODENOSCOPY (EGD) WITH PROPOFOL N/A 07/23/2016   Procedure: ESOPHAGOGASTRODUODENOSCOPY (EGD) WITH PROPOFOL;  Surgeon: Laurence Spates, MD;  [facility];  Service: Endoscopy;   Laterality: N/A;   ESOPHAGOGASTRODUODENOSCOPY (EGD) WITH PROPOFOL N/A 06/11/2018   Procedure: ESOPHAGOGASTRODUODENOSCOPY (EGD) WITH PROPOFOL;  Surgeon: Laurence Spates, MD;  [facility];  Service: Endoscopy;  Laterality: N/A;   LUMBAR LAMINECTOMY  2010   ORIF WRIST FRACTURE Right 11/24/2020   Procedure: OPEN REDUCTION INTERNAL FIXATION RIGHT WRIST FRACTURE;  Surgeon: Renette Butters, MD;  [facility];  Service: Orthopedics;  Laterality: Right;   UMBILICAL HERNIA REPAIR     age 46   UPPER GI ENDOSCOPY     Patient Active Problem List   Diagnosis Date Noted   Cerebellar tumor ([place]) 11/15/2021   Comorbid sleep-related hypoventilation 10/19/2021   Urinary incontinence/areflexic bladder 10/05/2021   OSA, unable to tolerate CPAP (vomited every night) 09/05/2021   Chronic pain syndrome 09/05/2021   Chronic nausea 09/05/2021   Neurogenic bladder, followed by Urology, instructed I&O cath BID 09/05/2021   Intolerance of continuous positive airway pressure (CPAP) ventilation 09/03/2021   Insomnia secondary to chronic pain 07/24/2021   Primary osteoarthritis of right knee 07/03/2021   Myofascial pain dysfunction syndrome 07/03/2021   Fibromyalgia 07/03/2021   Hyponatremia 07/03/2021   Primary osteoarthritis of left knee 07/03/2021  Body mass index (BMI) 50.0-59.9, adult ([place]) 03/07/2021   Allergic rhinitis due to animal (cat) (dog) hair and dander 10/06/2020   Allergic rhinitis due to pollen 10/06/2020   Moderate persistent asthma, uncomplicated 97/09/6376   Cerebellar mass 10/06/2020   Paresthesia 05/27/2020   Diabetes mellitus ([place]) 05/25/2020   Hypertension associated with diabetes ([place]) 05/25/2020   Hyperlipidemia associated with type 2 diabetes mellitus ([place]) 05/25/2020   Vitamin D deficiency 05/25/2020   NAFLD (nonalcoholic fatty liver disease) 05/25/2020   Sciatica of left side 05/25/2020    ONSET DATE: March 2023 (surgery 11/07/2021)  REFERRING DIAG: D49.6  (ICD-10-CM) - Cerebellar tumor ([place])   THERAPY DIAG:  Aphasia  Cognitive communication deficit  Dysarthria and anarthria  SUBJECTIVE: "I've been dropping stuff all day"  PAIN:  Are you having pain? Yes NPRS scale: 8/10 Pain location: "everywhere"  OBJECTIVE:   TODAY'S TREATMENT: SKILLED INTERVENTIONS:  01-04-22: Pt reports she has moved back home, husband has taken over her care. Concerned regarding intensity of her care, she ? whether she would benefit from IP rehab. Pt's primary concern at this time is her shaking, has received updated medications at last MD visit. Pt reports she has implemented medication management system with success, no reported missed doses of medication administration. Pt reports continued communication breakdowns due to motor speech. ST provides education on compensations pt can implement in addition to SLOP. Pt IDs x2 compensations which would be helpful for her. Target attention and reasoning through verbally presented math problems. 5/5 accuracy with use of self-selected tool (phone calculator). No issues with comprehension, planning, or problem solving observed.   12-27-21: Re-eduction provided on dysarthria strategies. Use of acronym SLOP. Demonstration from [place] then targeted practice with oral reading, sentence level. Mod-I during structured activity 90% of trials, does not carryover to spontaneous utterances. Pt reports not liking how she sounds.   12-26-21: Re-education provided on attention and memory compensations. Discussion on internal vs external memory strategies. Pt primary using external compensations, with reported success. Using calendar, planner, writing lists and notes, MyChart for doctors information. Education on internal memory strategies, practice with creating story. Pt able to recall presented information with 80% accuracy. Led pt through procedural discourse activity. Able to direct ST how she'd make favorite meal with occasional mod-A for  clarity and details.   12-21-21: ST led pt through narrative discourse training, using NARNIA protocol. With this outline, pt able to tell x3 personal stories with increased details and clarity of narrative. Prior to initiation, given narrative prompt pt with vague response lacking content or important details relevant for comprehension of novel listener. Discussed  Pt reports continued successful implementation of calendar system to manage her schedule. She has created a routine of referring to planner/calendar daily to orient to days activities. Pt able to use calendar on phone to relay dates of x2 upcoming doctors appointments date/time with mod-I. Led pt through cognitive exercises regarding schedule management. 100% accuracy answering time/planning related questions with rare min-A. ST recommends pt ask for information from Drs visits in writing or taking notes. Discussion on ability to use mychart to view information she may have forgotten.   PATIENT EDUCATION: Education details: see above Person educated: Patient Education method: Explanation, Demonstration, and Handouts Education comprehension: verbalized understanding, returned demonstration, and needs further education     GOALS: Goals reviewed with patient? Yes   SHORT TERM GOALS: Target date: 12/27/2021   Pt will complete standardized cognitive assessment to better appreciate reported cognitive deficits within first 2  sessions.  Baseline: 12-06-21, 12-12-21 Goal status: goal met   2.  Pt will verbalize and implement 2 attention and memory compensations to aid daily functioning given occasional min A over 2 sessions Baseline: 12-19-21; 12-26-21 Goal status: goal met   3.  Pt will demonstrate successful usage of anomia compensations, with occasional min-A, to make 5 minute mod complex conversation functional over 2 sessions Baseline: 12-21-21, 12-26-21 Goal status: goal met   4.  Pt will demonstrate dysarthria compensations on structured  speech tasks with 80% accuracy given occasional min A over 2 sessions Baseline: 12-21-21, 12-26-21 Goal status: goal met     LONG TERM GOALS: Target date: 02/21/2022   Pt will verbalize x3 functional areas in which implementation of attention/memory compensations/strategies have resulted in increased successful participation in desired activities.  Baseline:  Goal status: ongoing   2.  Pt will report completion of dysarthria HEP at least 1x/day (rec BID) over 1 week period with rare min-A.  Baseline:  Goal status: ongoing   3.  Pt will report improvement on CPIB by 3 points by d/c.  Baseline:  Goal status: ongoing   4.  Pt will utilize anomia compensations when word finding occurs in 20+ minute conversation with rare min A Baseline:  Goal status: ongoing     ASSESSMENT:   CLINICAL IMPRESSION: Patient is a 62 y.o. F who was seen today for cognitive communication deficits related to cognition, speech, and language s/p cerebellar tumor resection and CVA. Pt demonstrates significantly impacted processing, requiring extended time to process visual and auditory information and form appropriate responses in conversation. Auditory processing impairments observed in y/n questions, pt with high accuracy, however requires repetition from ST and to self + extended period to generate response. Anomia demonstrated and frequent use of vague language without awareness. W/o cueing, pt not aware that ST lacks insight into vague references and requires min-A to repair communication breakdowns. Demonstrates memory impairments in simple tasks such as stating months of year, has to repeat to self to come up with "October." Currently residing with God-daughter d/t limited ability to care for self. Currently requires assistance with medication management, managing finances, feeding self, managing schedule. Dysphagia impairments noted from [facility] notes, pt reports to using compensations and is able to teach back those to  ST. Pain and fatigue likely impacting pt's overall cognitive function, based on pt report. CLQT administration demonstrates moderate impairment in memory, mild impairment in language, low WNL for domains of attention, ex functions, visuospatial skills. Overall severity rating of mild.    OBJECTIVE IMPAIRMENTS include attention, memory, executive functioning, expressive language, receptive language, dysarthria, and dysphagia. These impairments are limiting patient from managing medications, managing appointments, managing finances, household responsibilities, ADLs/IADLs, effectively communicating at home and in community, and safety when swallowing. No overt factors affecting potential to achieve goals and functional outcomes. Patient will benefit from skilled SLP services to address above impairments and improve overall function.   REHAB POTENTIAL: Good   PLAN: SLP FREQUENCY: 2x/week   SLP DURATION: 12 weeks   PLANNED INTERVENTIONS: Aspiration precaution training, Diet toleration management , Environmental controls, Trials of upgraded texture/liquids, Cueing hierachy, Cognitive reorganization, Internal/external aids, Functional tasks, Multimodal communication approach, SLP instruction and feedback, Compensatory strategies, and Patient/family education  Su Monks, [place] 01/04/2022, 8:06 AM

## 2022-01-08 DIAGNOSIS — D332 Benign neoplasm of brain, unspecified: Secondary | ICD-10-CM | POA: Diagnosis not present

## 2022-01-08 DIAGNOSIS — Z6841 Body Mass Index (BMI) 40.0 and over, adult: Secondary | ICD-10-CM | POA: Diagnosis not present

## 2022-01-09 ENCOUNTER — Ambulatory Visit: Payer: Medicare HMO | Admitting: Physical Therapy

## 2022-01-09 ENCOUNTER — Encounter: Payer: Self-pay | Admitting: Occupational Therapy

## 2022-01-09 ENCOUNTER — Ambulatory Visit: Payer: Medicare HMO | Admitting: Speech Pathology

## 2022-01-09 ENCOUNTER — Encounter: Payer: Self-pay | Admitting: Physical Therapy

## 2022-01-09 ENCOUNTER — Ambulatory Visit: Payer: Medicare HMO | Admitting: Occupational Therapy

## 2022-01-09 DIAGNOSIS — R2681 Unsteadiness on feet: Secondary | ICD-10-CM

## 2022-01-09 DIAGNOSIS — R278 Other lack of coordination: Secondary | ICD-10-CM

## 2022-01-09 DIAGNOSIS — R41840 Attention and concentration deficit: Secondary | ICD-10-CM | POA: Diagnosis not present

## 2022-01-09 DIAGNOSIS — M6281 Muscle weakness (generalized): Secondary | ICD-10-CM

## 2022-01-09 DIAGNOSIS — R208 Other disturbances of skin sensation: Secondary | ICD-10-CM

## 2022-01-09 DIAGNOSIS — R2689 Other abnormalities of gait and mobility: Secondary | ICD-10-CM

## 2022-01-09 DIAGNOSIS — R471 Dysarthria and anarthria: Secondary | ICD-10-CM

## 2022-01-09 DIAGNOSIS — R41844 Frontal lobe and executive function deficit: Secondary | ICD-10-CM

## 2022-01-09 DIAGNOSIS — R41841 Cognitive communication deficit: Secondary | ICD-10-CM

## 2022-01-09 DIAGNOSIS — R6889 Other general symptoms and signs: Secondary | ICD-10-CM | POA: Diagnosis not present

## 2022-01-09 DIAGNOSIS — R4701 Aphasia: Secondary | ICD-10-CM

## 2022-01-09 NOTE — Therapy (Signed)
OUTPATIENT SPEECH LANGUAGE PATHOLOGY TREATMENT NOTE   Patient Name: Virginia Luna MRN: [id number] DOB:Dec 30, 1960, 61 y.o., female Today's Date: 01/09/2022  PCP: Shirline Frees, MD REFERRING PROVIDER: Barbie Banner, PA-C   END OF SESSION:   End of Session - 01/09/22 0752     Visit Number 9    Number of Visits 25    Date for SLP Re-Evaluation 02/20/22    Authorization Type [employer]    Progress Note Due on Visit 10    SLP Start Time 0800    SLP Stop Time  0845    SLP Time Calculation (min) 45 min    Activity Tolerance Patient tolerated treatment well                 Past Medical History:  Diagnosis Date   Allergies    Anemia    Arthritis    Asthma    Back pain    Chronic pain    Complication of anesthesia    woke up during colonoscopy   Diabetes ([place])    Diabetes mellitus without complication (HCC)    Edema, lower extremity    Fibroid    Fibromyalgia    Chronic   GERD (gastroesophageal reflux disease)    Headache    migraines   High blood pressure    History of hiatal hernia    History of stomach ulcers    IBS (irritable bowel syndrome)    Joint pain    Sleep apnea    did use cpap-lost 25lb-says she does not need it   NO CPAP   Wears contact lenses    Past Surgical History:  Procedure Laterality Date   BRAIN SURGERY Left 11/07/2021   [facility] Dr. Herschel [place]   BREAST BIOPSY     BREAST EXCISIONAL BIOPSY     BREAST LUMPECTOMY WITH RADIOACTIVE SEED LOCALIZATION Bilateral 11/24/2014   Procedure: BILATERAL BREAST LUMPECTOMY WITH RADIOACTIVE SEED LOCALIZATION;  Surgeon: [name], MD;  Location: [place];  Service: General;  Laterality: Bilateral;   CHOLECYSTECTOMY     COLONOSCOPY     DILATION AND CURETTAGE OF UTERUS     ESOPHAGOGASTRODUODENOSCOPY (EGD) WITH PROPOFOL N/A 07/23/2016   Procedure: ESOPHAGOGASTRODUODENOSCOPY (EGD) WITH PROPOFOL;  Surgeon: Laurence Spates, MD;  Location: [facility];  Service: Endoscopy;   Laterality: N/A;   ESOPHAGOGASTRODUODENOSCOPY (EGD) WITH PROPOFOL N/A 06/11/2018   Procedure: ESOPHAGOGASTRODUODENOSCOPY (EGD) WITH PROPOFOL;  Surgeon: Laurence Spates, MD;  Location: [facility];  Service: Endoscopy;  Laterality: N/A;   LUMBAR LAMINECTOMY  2010   ORIF WRIST FRACTURE Right 11/24/2020   Procedure: OPEN REDUCTION INTERNAL FIXATION RIGHT WRIST FRACTURE;  Surgeon: Renette Butters, MD;  Location: [facility];  Service: Orthopedics;  Laterality: Right;   UMBILICAL HERNIA REPAIR     age 37   UPPER GI ENDOSCOPY     Patient Active Problem List   Diagnosis Date Noted   Cerebellar tumor ([place]) 11/15/2021   Comorbid sleep-related hypoventilation 10/19/2021   Urinary incontinence/areflexic bladder 10/05/2021   OSA, unable to tolerate CPAP (vomited every night) 09/05/2021   Chronic pain syndrome 09/05/2021   Chronic nausea 09/05/2021   Neurogenic bladder, followed by Urology, instructed I&O cath BID 09/05/2021   Intolerance of continuous positive airway pressure (CPAP) ventilation 09/03/2021   Insomnia secondary to chronic pain 07/24/2021   Primary osteoarthritis of right knee 07/03/2021   Myofascial pain dysfunction syndrome 07/03/2021   Fibromyalgia 07/03/2021   Hyponatremia 07/03/2021   Primary osteoarthritis of left knee 07/03/2021  Body mass index (BMI) 50.0-59.9, adult ([place]) 03/07/2021   Allergic rhinitis due to animal (cat) (dog) hair and dander 10/06/2020   Allergic rhinitis due to pollen 10/06/2020   Moderate persistent asthma, uncomplicated 90/24/0973   Cerebellar mass 10/06/2020   Paresthesia 05/27/2020   Diabetes mellitus ([place]) 05/25/2020   Hypertension associated with diabetes ([place]) 05/25/2020   Hyperlipidemia associated with type 2 diabetes mellitus ([place]) 05/25/2020   Vitamin D deficiency 05/25/2020   NAFLD (nonalcoholic fatty liver disease) 05/25/2020   Sciatica of left side 05/25/2020    ONSET DATE: March 2023 (surgery 11/07/2021)  REFERRING DIAG: D49.6  (ICD-10-CM) - Cerebellar tumor ([place])   THERAPY DIAG:  Aphasia  Cognitive communication deficit  Dysarthria and anarthria  SUBJECTIVE: "It's going, he's trying" re: how it's going since going home.   PAIN:  Are you having pain? Yes NPRS scale: 8/10 Pain location: "everywhere, sharp pain at scar"  OBJECTIVE:   TODAY'S TREATMENT: SKILLED INTERVENTIONS:  01-09-22: Discussion on pt's involvement in household activities. Reports trying to increase activities, including paying bills. Reports some frustration with task. Education on implementation of stop-think-do to mitigate barriers and prepare for successful completion of task. Practiced using personally relevant tasks. Occasional feedback on implementation of compensations and external aids to enhance success. IND for sequencing steps. Led pt through synonym task as anomia strategy. 60% accuracy, usual mod-A required.   01-04-22: Pt reports she has moved back home, husband has taken over her care. Concerned regarding intensity of her care, she ? whether she would benefit from IP rehab. Pt's primary concern at this time is her shaking, has received updated medications at last MD visit. Pt reports she has implemented medication management system with success, no reported missed doses of medication administration. Pt reports continued communication breakdowns due to motor speech. ST provides education on compensations pt can implement in addition to SLOP. Pt IDs x2 compensations which would be helpful for her. Target attention and reasoning through verbally presented math problems. 5/5 accuracy with use of self-selected tool (phone calculator). No issues with comprehension, planning, or problem solving observed.   12-27-21: Re-eduction provided on dysarthria strategies. Use of acronym SLOP. Demonstration from [place] then targeted practice with oral reading, sentence level. Mod-I during structured activity 90% of trials, does not carryover to spontaneous  utterances. Pt reports not liking how she sounds.   12-26-21: Re-education provided on attention and memory compensations. Discussion on internal vs external memory strategies. Pt primary using external compensations, with reported success. Using calendar, planner, writing lists and notes, MyChart for doctors information. Education on internal memory strategies, practice with creating story. Pt able to recall presented information with 80% accuracy. Led pt through procedural discourse activity. Able to direct ST how she'd make favorite meal with occasional mod-A for clarity and details.   12-21-21: ST led pt through narrative discourse training, using NARNIA protocol. With this outline, pt able to tell x3 personal stories with increased details and clarity of narrative. Prior to initiation, given narrative prompt pt with vague response lacking content or important details relevant for comprehension of novel listener. Discussed  Pt reports continued successful implementation of calendar system to manage her schedule. She has created a routine of referring to planner/calendar daily to orient to days activities. Pt able to use calendar on phone to relay dates of x2 upcoming doctors appointments date/time with mod-I. Led pt through cognitive exercises regarding schedule management. 100% accuracy answering time/planning related questions with rare min-A. ST recommends pt ask for information from  Drs visits in writing or taking notes. Discussion on ability to use mychart to view information she may have forgotten.   PATIENT EDUCATION: Education details: see above Person educated: Patient Education method: Explanation, Demonstration, and Handouts Education comprehension: verbalized understanding, returned demonstration, and needs further education     GOALS: Goals reviewed with patient? Yes   SHORT TERM GOALS: Target date: 12/27/2021   Pt will complete standardized cognitive assessment to better appreciate  reported cognitive deficits within first 2 sessions.  Baseline: 12-06-21, 12-12-21 Goal status: goal met   2.  Pt will verbalize and implement 2 attention and memory compensations to aid daily functioning given occasional min A over 2 sessions Baseline: 12-19-21; 12-26-21 Goal status: goal met   3.  Pt will demonstrate successful usage of anomia compensations, with occasional min-A, to make 5 minute mod complex conversation functional over 2 sessions Baseline: 12-21-21, 12-26-21 Goal status: goal met   4.  Pt will demonstrate dysarthria compensations on structured speech tasks with 80% accuracy given occasional min A over 2 sessions Baseline: 12-21-21, 12-26-21 Goal status: goal met     LONG TERM GOALS: Target date: 02/21/2022   Pt will verbalize x3 functional areas in which implementation of attention/memory compensations/strategies have resulted in increased successful participation in desired activities.  Baseline:  Goal status: ongoing   2.  Pt will report completion of dysarthria HEP at least 1x/day (rec BID) over 1 week period with rare min-A.  Baseline:  Goal status: ongoing   3.  Pt will report improvement on CPIB by 3 points by d/c.  Baseline:  Goal status: ongoing   4.  Pt will utilize anomia compensations when word finding occurs in 20+ minute conversation with rare min A Baseline:  Goal status: ongoing     ASSESSMENT:   CLINICAL IMPRESSION: Patient is a 61 y.o. F who was seen today for cognitive communication deficits related to cognition, speech, and language s/p cerebellar tumor resection and CVA. Pt demonstrates significantly impacted processing, requiring extended time to process visual and auditory information and form appropriate responses in conversation. Auditory processing impairments observed in y/n questions, pt with high accuracy, however requires repetition from ST and to self + extended period to generate response. Anomia demonstrated and frequent use of vague  language without awareness. W/o cueing, pt not aware that ST lacks insight into vague references and requires min-A to repair communication breakdowns. Demonstrates memory impairments in simple tasks such as stating months of year, has to repeat to self to come up with "October." Currently residing with God-daughter d/t limited ability to care for self. Currently requires assistance with medication management, managing finances, feeding self, managing schedule. Dysphagia impairments noted from CIR notes, pt reports to using compensations and is able to teach back those to ST. Pain and fatigue likely impacting pt's overall cognitive function, based on pt report. CLQT administration demonstrates moderate impairment in memory, mild impairment in language, low WNL for domains of attention, ex functions, visuospatial skills. Overall severity rating of mild.    OBJECTIVE IMPAIRMENTS include attention, memory, executive functioning, expressive language, receptive language, dysarthria, and dysphagia. These impairments are limiting patient from managing medications, managing appointments, managing finances, household responsibilities, ADLs/IADLs, effectively communicating at home and in community, and safety when swallowing. No overt factors affecting potential to achieve goals and functional outcomes. Patient will benefit from skilled SLP services to address above impairments and improve overall function.   REHAB POTENTIAL: Good   PLAN: SLP FREQUENCY: 2x/week   SLP DURATION:  12 weeks   PLANNED INTERVENTIONS: Aspiration precaution training, Diet toleration management , Environmental controls, Trials of upgraded texture/liquids, Cueing hierachy, Cognitive reorganization, Internal/external aids, Functional tasks, Multimodal communication approach, SLP instruction and feedback, Compensatory strategies, and Patient/family education  Su Monks, [place] 01/09/2022, 7:53 AM

## 2022-01-09 NOTE — Therapy (Signed)
OUTPATIENT PHYSICAL THERAPY TREATMENT NOTE   Patient Name: Virginia Luna MRN: [id number] DOB:11/09/1960, 61 y.o., female Today's Date: 01/09/2022  PCP: Shirline Frees, MD REFERRING PROVIDER: Courtney Heys, MD    END OF SESSION:   PT End of Session - 01/09/22 0937     Visit Number 8    Number of Visits 13    Date for PT Re-Evaluation 02/23/22    Authorization Type [employer]    Progress Note Due on Visit 10    PT Start Time 0935    PT Stop Time 1017    PT Time Calculation (min) 42 min    Equipment Utilized During Treatment Gait belt    Activity Tolerance Patient tolerated treatment well    Behavior During Therapy WFL for tasks assessed/performed              Past Medical History:  Diagnosis Date   Allergies    Anemia    Arthritis    Asthma    Back pain    Chronic pain    Complication of anesthesia    woke up during colonoscopy   Diabetes ([place])    Diabetes mellitus without complication (HCC)    Edema, lower extremity    Fibroid    Fibromyalgia    Chronic   GERD (gastroesophageal reflux disease)    Headache    migraines   High blood pressure    History of hiatal hernia    History of stomach ulcers    IBS (irritable bowel syndrome)    Joint pain    Sleep apnea    did use cpap-lost 25lb-says she does not need it   NO CPAP   Wears contact lenses    Past Surgical History:  Procedure Laterality Date   BRAIN SURGERY Left 11/07/2021   [facility] Dr. Herschel [place]   BREAST BIOPSY     BREAST EXCISIONAL BIOPSY     BREAST LUMPECTOMY WITH RADIOACTIVE SEED LOCALIZATION Bilateral 11/24/2014   Procedure: BILATERAL BREAST LUMPECTOMY WITH RADIOACTIVE SEED LOCALIZATION;  Surgeon: [name], MD;  Location: [place];  Service: General;  Laterality: Bilateral;   CHOLECYSTECTOMY     COLONOSCOPY     DILATION AND CURETTAGE OF UTERUS     ESOPHAGOGASTRODUODENOSCOPY (EGD) WITH PROPOFOL N/A 07/23/2016   Procedure: ESOPHAGOGASTRODUODENOSCOPY (EGD)  WITH PROPOFOL;  Surgeon: Laurence Spates, MD;  [facility];  Service: Endoscopy;  Laterality: N/A;   ESOPHAGOGASTRODUODENOSCOPY (EGD) WITH PROPOFOL N/A 06/11/2018   Procedure: ESOPHAGOGASTRODUODENOSCOPY (EGD) WITH PROPOFOL;  Surgeon: Laurence Spates, MD;  [facility];  Service: Endoscopy;  Laterality: N/A;   LUMBAR LAMINECTOMY  2010   ORIF WRIST FRACTURE Right 11/24/2020   Procedure: OPEN REDUCTION INTERNAL FIXATION RIGHT WRIST FRACTURE;  Surgeon: Renette Butters, MD;  Location: [facility];  Service: Orthopedics;  Laterality: Right;   UMBILICAL HERNIA REPAIR     age 28   UPPER GI ENDOSCOPY     Patient Active Problem List   Diagnosis Date Noted   Cerebellar tumor ([place]) 11/15/2021   Comorbid sleep-related hypoventilation 10/19/2021   Urinary incontinence/areflexic bladder 10/05/2021   OSA, unable to tolerate CPAP (vomited every night) 09/05/2021   Chronic pain syndrome 09/05/2021   Chronic nausea 09/05/2021   Neurogenic bladder, followed by Urology, instructed I&O cath BID 09/05/2021   Intolerance of continuous positive airway pressure (CPAP) ventilation 09/03/2021   Insomnia secondary to chronic pain 07/24/2021   Primary osteoarthritis of right knee 07/03/2021   Myofascial pain dysfunction syndrome 07/03/2021  Fibromyalgia 07/03/2021   Hyponatremia 07/03/2021   Primary osteoarthritis of left knee 07/03/2021   Body mass index (BMI) 50.0-59.9, adult ([place]) 03/07/2021   Allergic rhinitis due to animal (cat) (dog) hair and dander 10/06/2020   Allergic rhinitis due to pollen 10/06/2020   Moderate persistent asthma, uncomplicated 25/42/7062   Cerebellar mass 10/06/2020   Paresthesia 05/27/2020   Diabetes mellitus ([place]) 05/25/2020   Hypertension associated with diabetes ([place]) 05/25/2020   Hyperlipidemia associated with type 2 diabetes mellitus ([place]) 05/25/2020   Vitamin D deficiency 05/25/2020   NAFLD (nonalcoholic fatty liver disease) 05/25/2020   Sciatica of left side  05/25/2020    REFERRING DIAG: D49.6 (ICD-10-CM) - Neoplasm of unspecified behavior of brain    THERAPY DIAG:  Other lack of coordination  Other abnormalities of gait and mobility  Unsteadiness on feet  Muscle weakness (generalized)  PERTINENT HISTORY: From H&P note on 11/15/2021:  "On the day of admission 11/07/2021, the patient was taken to the operating room by Dr. Recardo Evangelist of neurosurgery and underwent left suboccipital craniotomy, resection of cerebellar tumor. She tolerated the procedure well. Postoperative MRI performed on 3/22 revealed a small area of diffusion restriction in the left cerebellum."  Patient transferred to [place] on 11/15/2021, discharged 11/24/2021 per pt.  fibromyalgia, DM2, asthma, HTN, OSA, migraines, bilateral knee OA, myofascial pain syndrome    PRECAUTIONS: Fall  SUBJECTIVE: Pt states she [facility]) and was told "call us if you need [name]".  She is being patient with her boyfriend as is trying to help her adjust to being in her own home.  She is not washing clothes yet due to her balance and need for the RW.  She is in better spirits this visit.  She inquires about a "knot" on the distal medial right thigh near knee joint line.  Upon palpation hard to discern this, but mildly noted bilaterally in symmetrical areas.  Discussed this with patient.  Her friends are slowly building her a removable ramp to enter her house.  PAIN:  She endorses same pain from prior session. Are you having pain? Yes: NPRS scale: 8/10 Pain location: All over (fibromyalgia), worse in head and neck and BLE (LLE worse)  - she feels a cutting-like sensation in the left posterior part of her head not quite over the incision (incision intact and pink)  OBJECTIVE:    DIAGNOSTIC FINDINGS: From Brain MRI on 09/02/2021:  "Increased size of left superior cerebellar vermis mass, now measuring 1.5 x 1.5 cm, previously 1.1 x 1.0 cm. Increased mild surrounding  edema."  Future brain MRI ordered w/o date visible at this time.   COGNITION: Overall cognitive status: No family/caregiver present to determine baseline cognitive functioning - pt states "I feel stupid", she elaborates that she is having a hard time processing.     TODAY'S TREATMENT:  Verbally reviewed HEP, remains moderately challenging. Assessed 5xSTS:  21.94 seconds from standard chair height w/ BUE support Assessed BERG:  [facility] PT Assessment - 01/09/22 0952       Berg Balance Test   Sit to Stand Able to stand without using hands and stabilize independently    Standing Unsupported Able to stand 2 minutes with supervision    Sitting with Back Unsupported but Feet Supported on Floor or Stool Able to sit safely and securely 2 minutes    Stand to Sit Sits safely with minimal use of hands    Transfers Able to transfer safely, minor use of  hands    Standing Unsupported with Eyes Closed Able to stand 10 seconds with supervision    Standing Unsupported with Feet Together Able to place feet together independently and stand for 1 minute with supervision    From Standing, Reach Forward with Outstretched Arm Can reach forward >5 cm safely (2")    From Standing Position, Pick up Object from [place] to pick up shoe, needs supervision   W/o use of RW   From Standing Position, Turn to Look Behind Over each Shoulder Turn sideways only but maintains balance    Turn 360 Degrees Able to turn 360 degrees safely but slowly    Standing Unsupported, Alternately Place Feet on Step/Stool Able to complete >2 steps/needs minimal assist    Standing Unsupported, One Foot in Front Able to take small step independently and hold 30 seconds    Standing on One Leg Tries to lift leg/unable to hold 3 seconds but remains standing independently    Total Score 38    Berg comment: High fall risk           Assessed TUG:  18.91 seconds w/ RW and Supervision Assessed 10MWT: 10.90 sec w/ RW =0.92 m/sec OR 3.03  ft/sec    PATIENT EDUCATION: Education details: Discussed progress towards STGs and continuing HEP. Person educated: Patient Education method: Explanation  Education comprehension: verbalized understanding and needs further education     HOME EXERCISE PROGRAM: Access Code: [email or web address] URL: [email or web address] Date: 12/06/2021 Prepared by: Mickie Bail Plaster  Exercises - Sit to Stand Without Arm Support  - 1 x daily - 7 x weekly - 3 sets - 10 reps - Standing March with Counter Support  - 1 x daily - 7 x weekly - 3 sets - 10 reps - Chin Tuck  - 1 x daily - 7 x weekly - 3 sets - 10 reps - Seated Cervical Sidebending Stretch  - 1 x daily - 7 x weekly - 3 sets - 10 reps  - Supine Bridge  - 1 x daily - 4 x weekly - 3 sets - 10 reps - Supine Active Straight Leg Raise  - 1 x daily - 4 x weekly - 2 sets - 8 reps - Forward and Backward Monster Walk with Counter Support  - 1 x daily - 4 x weekly - 3 sets - 10 reps - Seated Heel Raise  - 1 x daily - 4 x weekly - 2 sets - 12 reps   GOALS: Goals reviewed with patient? Yes   SHORT TERM GOALS: Target date:  01/12/2022   Pt will be independent with initial strength and balance HEP with supervision from family. Baseline:  Established 12/06/2021, pt compliant intermittently Goal status: PARTIALLY MET   2.  Pt will decrease 5xSTS to <35 seconds w/ BUE support in order to demonstrate decreased risk for falls and improved functional bilateral LE strength and power. Baseline: 38.51sec w/ BUE; 21.94 sec w/ BUE support Goal status: MET   3.  Pt will demonstrate a gait speed of >1 feet/sec in order to decrease risk for falls. Baseline: 0.76 ft/sec;  3.03 ft/sec w/ RW Goal status: MET   4.  Pt will improve normal TUG to less than or equal to 30 seconds w/LRAD for improved functional mobility and decreased fall risk.  Baseline: 34.87s w/RW and S*; 18.91 seconds w/ RW and S* Goal status: MET   5.  Pt will improve Berg score to 41/56 for  decreased fall risk  Baseline: 35/56  on 4/19; 38/56 Goal status: PARTIALLY MET   LONG TERM GOALS: Target date:  02/23/2022   Pt will report return to some form of aerobic activity with maintenance of advanced HEP for strength and balance. Baseline: To be established Goal status: INITIAL   2.  Pt will decrease 5xSTS to <20 seconds w/ BUE support in order to demonstrate decreased risk for falls and improved functional bilateral LE strength and power. Baseline: 38.51 sec w/ BUE support; 21.94 seconds 01/09/2022 Goal status: REVISED   3.  Pt will demonstrate a gait speed of >3.3 feet/sec in order to decrease risk for falls. Baseline: 0.76 ft/sec; 3.03 ft/sec w/ RW Goal status: REVISED   4.  Pt will improve normal TUG to less than or equal to 16 seconds w/LRAD for improved functional mobility and decreased fall risk.  Baseline: 34.87s w/RW and S*; 18.91s w/ RW and S* Goal status: REVISED   5.  Pt will improve Berg score to 48/56 for decreased fall risk  Baseline: 35/56 on 4/19 Goal status: INITIAL   6.  Pt will tolerate >54mins of continuous activity in order to promote activity tolerance and endurance. Baseline: Pt extremely fatigued during session, nodding off intermittently. Goal status: INITIAL   ASSESSMENT:   CLINICAL IMPRESSION: STGs assessed today with pt demonstrating improved static balance with an increase in BERG score from 35 to 38/56.  Her TUG and 5xSTS times decreased to 18.91 and 21.94 seconds respectively.  She is ambulating at a speed of 3.03 ft/sec with use of a RW.  She continues to be challenged by consistency with her HEP, narrowed BOS, turning w/ RW, balance without UE support and generalized LE weakness.  She required mild redirection to tasks throughout session today.  Will continue to benefit from skilled PT to progress towards goals as able.    OBJECTIVE IMPAIRMENTS Abnormal gait, decreased activity tolerance, decreased balance, decreased cognition, decreased  coordination, decreased endurance, decreased knowledge of condition, decreased knowledge of use of DME, decreased mobility, difficulty walking, decreased ROM, decreased strength, and decreased safety awareness.    ACTIVITY LIMITATIONS cleaning, community activity, driving, meal prep, laundry, and shopping.    PERSONAL FACTORS Age, Behavior pattern, Fitness, and 3+ comorbidities: fibromyalgia, DM2, asthma, HTN, OSA, migraines, bilateral knee OA, myofascial pain syndrome  are also affecting patient's functional outcome.      REHAB POTENTIAL: Fair hx of fibromyalgia, DM2, asthma, HTN, OSA, migraines, bilateral knee OA, myofascial pain syndrome , personal factors   CLINICAL DECISION MAKING: Evolving/moderate complexity   EVALUATION COMPLEXITY: Moderate   PLAN: PT FREQUENCY: 1x/week   PT DURATION: 12 weeks   PLANNED INTERVENTIONS: Therapeutic exercises, Therapeutic activity, Neuromuscular re-education, Balance training, Gait training, Patient/Family education, Joint mobilization, Stair training, Vestibular training, DME instructions, Manual therapy, and self care and home management   PLAN FOR NEXT SESSION: Use SciFit for endurance and strengthening w/ BUE/BLE.  Gait w/ rollator vs RW-did pt bring rollator?  Add to/revise HEP as needed. Single leg stability-on pillow (add to HEP), endurance, BLE strength,  monster walks-add resistance, try progressing heel raises to standing, gait training w/ RW management, toe taps 6", stair step ups 4">6" step, quad strengthening, work on backwards walking, reaching outside BOS-forward reach to floor, lateral stepping     Bary Richard, PT, DPT 01/09/2022, 10:41 AM

## 2022-01-09 NOTE — Therapy (Signed)
OUTPATIENT OCCUPATIONAL THERAPY TREATMENT NOTE   Patient Name: Virginia Luna MRN: [id number] DOB:Sep 23, 1960, 61 y.o., female Today's Date: 01/09/2022  PCP: Shirline Frees, MD REFERRING PROVIDER: Dr.Lovorn/ Risa Grill PA-C   END OF SESSION:   OT End of Session - 01/09/22 0851     Visit Number 10    Number of Visits 25    Date for OT Re-Evaluation 02/20/22    Authorization Type [employer]    Authorization - Visit Number 10    Authorization - Number of Visits 10    Progress Note Due on Visit 10    OT Start Time 0849    OT Stop Time 0928    OT Time Calculation (min) 39 min                 Past Medical History:  Diagnosis Date   Allergies    Anemia    Arthritis    Asthma    Back pain    Chronic pain    Complication of anesthesia    woke up during colonoscopy   Diabetes ([place])    Diabetes mellitus without complication (HCC)    Edema, lower extremity    Fibroid    Fibromyalgia    Chronic   GERD (gastroesophageal reflux disease)    Headache    migraines   High blood pressure    History of hiatal hernia    History of stomach ulcers    IBS (irritable bowel syndrome)    Joint pain    Sleep apnea    did use cpap-lost 25lb-says she does not need it   NO CPAP   Wears contact lenses    Past Surgical History:  Procedure Laterality Date   BRAIN SURGERY Left 11/07/2021   [facility] Dr. Herschel [place]   BREAST BIOPSY     BREAST EXCISIONAL BIOPSY     BREAST LUMPECTOMY WITH RADIOACTIVE SEED LOCALIZATION Bilateral 11/24/2014   Procedure: BILATERAL BREAST LUMPECTOMY WITH RADIOACTIVE SEED LOCALIZATION;  Surgeon: [name], MD;  Location: [facility];  Service: General;  Laterality: Bilateral;   CHOLECYSTECTOMY     COLONOSCOPY     DILATION AND CURETTAGE OF UTERUS     ESOPHAGOGASTRODUODENOSCOPY (EGD) WITH PROPOFOL N/A 07/23/2016   Procedure: ESOPHAGOGASTRODUODENOSCOPY (EGD) WITH PROPOFOL;  Surgeon: Laurence Spates, MD;  [facility];   Service: Endoscopy;  Laterality: N/A;   ESOPHAGOGASTRODUODENOSCOPY (EGD) WITH PROPOFOL N/A 06/11/2018   Procedure: ESOPHAGOGASTRODUODENOSCOPY (EGD) WITH PROPOFOL;  Surgeon: Laurence Spates, MD;  [facility];  Service: Endoscopy;  Laterality: N/A;   LUMBAR LAMINECTOMY  2010   ORIF WRIST FRACTURE Right 11/24/2020   Procedure: OPEN REDUCTION INTERNAL FIXATION RIGHT WRIST FRACTURE;  Surgeon: Renette Butters, MD;  [facility];  Service: Orthopedics;  Laterality: Right;   UMBILICAL HERNIA REPAIR     age 85   UPPER GI ENDOSCOPY     Patient Active Problem List   Diagnosis Date Noted   Cerebellar tumor ([place]) 11/15/2021   Comorbid sleep-related hypoventilation 10/19/2021   Urinary incontinence/areflexic bladder 10/05/2021   OSA, unable to tolerate CPAP (vomited every night) 09/05/2021   Chronic pain syndrome 09/05/2021   Chronic nausea 09/05/2021   Neurogenic bladder, followed by Urology, instructed I&O cath BID 09/05/2021   Intolerance of continuous positive airway pressure (CPAP) ventilation 09/03/2021   Insomnia secondary to chronic pain 07/24/2021   Primary osteoarthritis of right knee 07/03/2021   Myofascial pain dysfunction syndrome 07/03/2021   Fibromyalgia 07/03/2021   Hyponatremia 07/03/2021  Primary osteoarthritis of left knee 07/03/2021   Body mass index (BMI) 50.0-59.9, adult ([place]) 03/07/2021   Allergic rhinitis due to animal (cat) (dog) hair and dander 10/06/2020   Allergic rhinitis due to pollen 10/06/2020   Moderate persistent asthma, uncomplicated 43/88/8757   Cerebellar mass 10/06/2020   Paresthesia 05/27/2020   Diabetes mellitus ([place]) 05/25/2020   Hypertension associated with diabetes ([place]) 05/25/2020   Hyperlipidemia associated with type 2 diabetes mellitus ([place]) 05/25/2020   Vitamin D deficiency 05/25/2020   NAFLD (nonalcoholic fatty liver disease) 05/25/2020   Sciatica of left side 05/25/2020    ONSET DATE: 11/07/21   REFERRING DIAG: cerebellar  tumor, s/p craniotomy   THERAPY DIAG:  Other lack of coordination  Other abnormalities of gait and mobility  Unsteadiness on feet  Muscle weakness (generalized)  Attention and concentration deficit  Frontal lobe and executive function deficit  Other disturbances of skin sensation   PERTINENT HISTORY: Coda Filler is a 61 year old female who recently presented to hospitalized at [facility] complaining of headaches and tremors. 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 She was admitted 11/07/2021, underwent left suboccipital craniotomy, resection of cerebellar tumor. Postoperative MRI performed on 3/22 revealed a small area of diffusion restriction in the left cerebellum Pt was transferred to  [facility] 11/15/21 and  she was d/c home 11/24/21.PMH: arthritis, asthma, chronic pain, DM, fibromyalgia, myofascial pain syndrome, HTN, IBS, sleep apnea Brain   PRECAUTIONS: Fall, no driving  SUBJECTIVE: "I did some ironing"  PAIN:  Are you having pain? Yes: NPRS scale: 8/10 Pain location: head and neck, and legs (all over, generalized) Pain description: constant Aggravating factors: surgery, fibromyalgia, myofascial pain syndrome Relieving factors: meds    OBJECTIVE:   TODAY'S TREATMENT: Reviewed supine closed chain chest press in supine,  Pt attempted shoulder flexion in supine with foam roll, however had max difficulty due to ataxia. Then pt performed in seated to shoulder height with cane Pt reports folding laundry and carrying items on her rollator at home Arm bike 2 mins level 4 then 4 mins level 1 for conditioning. Therapist assisted pt in and out of BR for toileting due to heavy door supervision, min v.c      HOME EXERCISE PROGRAM: putty HEP, coordination HEP, sh flex supine (12/06/21); BUE HEP (12/14/21)   GOALS:     SHORT TERM GOALS: Target date: 12/26/2021   I with HEP  Goal status:  achieved   2.  Pt will perform dressing mod I in a reasonable amount of time Baseline: supervision, increased time required Goal status: GOAL MET pt reports completing dressing with supervision, 12/27/21   3.  Pt will perform basic home management with min A. Baseline: dependent Goal status: GOAL MET reports doing ironing 12/27/21   4.  Pt will perform simple cooking task with min a demonstrating good safety awareness. Baseline: dependent Goal status:  ongoing, min A in clinic, min-mod v.c for walker safety   5.  Pt will increase bilateral grip strength by 5 lbs for increased ease opening containers. Baseline RUE 21.1 lbs, LUE 14.9 lbs :  Goal status: GOAL MET RUE: 35.4   LUE 37.4   6.  Pt will demonstrate ability to stand for functional/ ADL tasks x 15 mins without rest break and no LOB. Baseline: poor and inconsistent endurance Goal status:  ONGOING  tolerated standing for 12.5 minutes in clinic 12/27/21   LONG TERM GOALS: Target date: 02/20/2022  I with updated HEP.   Goal status: ongoing   2.  Pt will perform all basic ADLS mod I Baseline: supervision Goal status: INITIAL   3.  Pt will perform basic home management modified I Baseline: dependent Goal status:  ongoing, folding laundry only   4.  Pt will perform basic cooking mod I demonstrating good safety awareness. Baseline: depdnent Goal status: ongoing   5.  Pt will demonstrate improved fine motor coordination for ADLS as evidenced by decreasing 9 hole peg test score by 3 secs, bilaterally. Baseline: R 35.06, L 38.53 Goal status: INITIAL   6.  Pt will demonstrate ability to retrieve a 3 lbs weight from overhead shelf with left and right UE's individually demonstrating good control. Baseline: decreased bilateral strength and control. Goal status: ongoing   ASSESSMENT:   CLINICAL IMPRESSION:  For the reporting period of 4/11/23523/23 pt demonstrates progress towards  all goals. She met 4/6 short term goals. She can  benefit from continued skilled OT to address the following deficits: ataxia, decreased coordination, decreased balance, decreased activity tolerance, pain  in order to maximize pt's safety and I with daily activities.   PERFORMANCE DEFICITS in functional skills including ADLs, IADLs, coordination, dexterity, sensation, ROM, strength, pain, fascial restrictions, flexibility, FMC, GMC, mobility, balance, endurance, decreased knowledge of precautions, decreased knowledge of use of DME, vision, and UE functional use, cognitive skills including attention, energy/drive, learn, memory, problem solving, safety awareness, temperament/personality, thought, and understand, and psychosocial skills including coping strategies, environmental adaptation, and routines and behaviors. Pt can benefit from skilled OT to address these deficits.   IMPAIRMENTS are limiting patient from ADLs, IADLs, rest and sleep, play, leisure, and social participation.    COMORBIDITIES may have co-morbidities  that affects occupational performance. Patient will benefit from skilled OT to address above impairments and improve overall function.   MODIFICATION OR ASSISTANCE TO COMPLETE EVALUATION: No modification of tasks or assist necessary to complete an evaluation.   OT OCCUPATIONAL PROFILE AND HISTORY: Detailed assessment: Review of records and additional review of physical, cognitive, psychosocial history related to current functional performance.   CLINICAL DECISION MAKING: LOW - limited treatment options, no task modification necessary   REHAB POTENTIAL: Good   EVALUATION COMPLEXITY: Low     PLAN: OT FREQUENCY: 2x/week   OT DURATION: 12 weeks plus eval   PLANNED INTERVENTIONS: self care/ADL training, therapeutic exercise, therapeutic activity, neuromuscular re-education, manual therapy, manual lymph drainage, passive range of motion, gait training, balance training, functional mobility training, aquatic therapy, splinting,  electrical stimulation, ultrasound, paraffin, fluidotherapy, moist heat, cryotherapy, contrast bath, patient/family education, cognitive remediation/compensation, visual/perceptual remediation/compensation, energy conservation, coping strategies training, and DME and/or AE instructions   RECOMMENDED OTHER SERVICES: n/a   CONSULTED AND AGREED WITH PLAN OF CARE: Patient   PLAN FOR NEXT SESSION:   standing and functional reach, simple home management, pt has ataxia R>L     Kennadi Albany, OTR/L               01/09/2022, 8:52 AM   Theone Murdoch, OTR/L [phone] Phone: [phone] 12:10 PM 01/09/22

## 2022-01-10 DIAGNOSIS — K219 Gastro-esophageal reflux disease without esophagitis: Secondary | ICD-10-CM | POA: Diagnosis not present

## 2022-01-10 DIAGNOSIS — E7800 Pure hypercholesterolemia, unspecified: Secondary | ICD-10-CM | POA: Diagnosis not present

## 2022-01-10 DIAGNOSIS — E119 Type 2 diabetes mellitus without complications: Secondary | ICD-10-CM | POA: Diagnosis not present

## 2022-01-10 DIAGNOSIS — I10 Essential (primary) hypertension: Secondary | ICD-10-CM | POA: Diagnosis not present

## 2022-01-10 NOTE — Therapy (Incomplete)
OUTPATIENT OCCUPATIONAL THERAPY TREATMENT NOTE   Patient Name: Virginia Luna MRN: [id number] DOB:10-09-1960, 61 y.o., female Today's Date: 01/10/2022  PCP: Virginia Frees, MD REFERRING PROVIDER: Dr.Lovorn/ Virginia Grill PA-C   END OF SESSION:         Past Medical History:  Diagnosis Date   Allergies    Anemia    Arthritis    Asthma    Back pain    Chronic pain    Complication of anesthesia    woke up during colonoscopy   Diabetes ([place])    Diabetes mellitus without complication (HCC)    Edema, lower extremity    Fibroid    Fibromyalgia    Chronic   GERD (gastroesophageal reflux disease)    Headache    migraines   High blood pressure    History of hiatal hernia    History of stomach ulcers    IBS (irritable bowel syndrome)    Joint pain    Sleep apnea    did use cpap-lost 25lb-says she does not need it   NO CPAP   Wears contact lenses    Past Surgical History:  Procedure Laterality Date   BRAIN SURGERY Left 11/07/2021   [facility] Dr. Herschel Luna   BREAST BIOPSY     BREAST EXCISIONAL BIOPSY     BREAST LUMPECTOMY WITH RADIOACTIVE SEED LOCALIZATION Bilateral 11/24/2014   Procedure: BILATERAL BREAST LUMPECTOMY WITH RADIOACTIVE SEED LOCALIZATION;  Surgeon: Virginia Luna, MD;  Location: [facility];  Service: General;  Laterality: Bilateral;   CHOLECYSTECTOMY     COLONOSCOPY     DILATION AND CURETTAGE OF UTERUS     ESOPHAGOGASTRODUODENOSCOPY (EGD) WITH PROPOFOL N/A 07/23/2016   Procedure: ESOPHAGOGASTRODUODENOSCOPY (EGD) WITH PROPOFOL;  Surgeon: Virginia Spates, MD;  [facility];  Service: Endoscopy;  Laterality: N/A;   ESOPHAGOGASTRODUODENOSCOPY (EGD) WITH PROPOFOL N/A 06/11/2018   Procedure: ESOPHAGOGASTRODUODENOSCOPY (EGD) WITH PROPOFOL;  Surgeon: Virginia Spates, MD;  [facility];  Service: Endoscopy;  Laterality: N/A;   LUMBAR LAMINECTOMY  2010   ORIF WRIST FRACTURE Right 11/24/2020   Procedure: OPEN REDUCTION INTERNAL  FIXATION RIGHT WRIST FRACTURE;  Surgeon: Virginia Butters, MD;  [facility];  Service: Orthopedics;  Laterality: Right;   UMBILICAL HERNIA REPAIR     age 27   UPPER GI ENDOSCOPY     Patient Active Problem List   Diagnosis Date Noted   Cerebellar tumor ([place]) 11/15/2021   Comorbid sleep-related hypoventilation 10/19/2021   Urinary incontinence/areflexic bladder 10/05/2021   OSA, unable to tolerate CPAP (vomited every night) 09/05/2021   Chronic pain syndrome 09/05/2021   Chronic nausea 09/05/2021   Neurogenic bladder, followed by Urology, instructed I&O cath BID 09/05/2021   Intolerance of continuous positive airway pressure (CPAP) ventilation 09/03/2021   Insomnia secondary to chronic pain 07/24/2021   Primary osteoarthritis of right knee 07/03/2021   Myofascial pain dysfunction syndrome 07/03/2021   Fibromyalgia 07/03/2021   Hyponatremia 07/03/2021   Primary osteoarthritis of left knee 07/03/2021   Body mass index (BMI) 50.0-59.9, adult ([place]) 03/07/2021   Allergic rhinitis due to animal (cat) (dog) hair and dander 10/06/2020   Allergic rhinitis due to pollen 10/06/2020   Moderate persistent asthma, uncomplicated 93/57/0177   Cerebellar mass 10/06/2020   Paresthesia 05/27/2020   Diabetes mellitus ([place]) 05/25/2020   Hypertension associated with diabetes ([place]) 05/25/2020   Hyperlipidemia associated with type 2 diabetes mellitus ([place]) 05/25/2020   Vitamin D deficiency 05/25/2020   NAFLD (nonalcoholic fatty liver disease) 05/25/2020  Sciatica of left side 05/25/2020    ONSET DATE: 11/07/21   REFERRING DIAG: cerebellar tumor, s/p craniotomy   THERAPY DIAG:  No diagnosis found.   PERTINENT HISTORY: [name] is a 61 year old female who recently presented to hospitalized at [facility] complaining of headaches and tremors. 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 She was admitted  11/07/2021, underwent left suboccipital craniotomy, resection of cerebellar tumor. Postoperative MRI performed on 3/22 revealed a small area of diffusion restriction in the left cerebellum Pt was transferred to  [facility] 11/15/21 and  she was d/c home 11/24/21.PMH: arthritis, asthma, chronic pain, DM, fibromyalgia, myofascial pain syndrome, HTN, IBS, sleep apnea Brain   PRECAUTIONS: Fall, no driving  SUBJECTIVE: *** "I did some ironing"  PAIN:  Are you having pain? Yes: NPRS scale: 8/10 Pain location: head and neck, and legs (all over, generalized) Pain description: constant Aggravating factors: surgery, fibromyalgia, myofascial pain syndrome Relieving factors: meds    OBJECTIVE:   TODAY'S TREATMENT:   *** Reviewed supine closed chain chest press in supine,  Pt attempted shoulder flexion in supine with foam roll, however had max difficulty due to ataxia. Then pt performed in seated to shoulder height with cane Pt reports folding laundry and carrying items on her rollator at home Arm bike 2 mins level 4 then 4 mins level 1 for conditioning. Therapist assisted pt in and out of BR for toileting due to heavy door supervision, min v.c      HOME EXERCISE PROGRAM: putty HEP, coordination HEP, sh flex supine (12/06/21); BUE HEP (12/14/21)   GOALS:     SHORT TERM GOALS: Target date: 12/26/2021   I with HEP  Goal status: achieved   2.  Pt will perform dressing mod I in a reasonable amount of time Baseline: supervision, increased time required Goal status: GOAL MET pt reports completing dressing with supervision, 12/27/21   3.  Pt will perform basic home management with min A. Baseline: dependent Goal status: GOAL MET reports doing ironing 12/27/21   4.  Pt will perform simple cooking task with min a demonstrating good safety awareness. Baseline: dependent Goal status:  ongoing, min A in clinic, min-mod v.c for walker safety   5.  Pt will increase bilateral grip strength by 5 lbs for  increased ease opening containers. Baseline RUE 21.1 lbs, LUE 14.9 lbs :  Goal status: GOAL MET RUE: 35.4   LUE 37.4   6.  Pt will demonstrate ability to stand for functional/ ADL tasks x 15 mins without rest break and no LOB. Baseline: poor and inconsistent endurance Goal status:  ONGOING  tolerated standing for 12.5 minutes in clinic 12/27/21   LONG TERM GOALS: Target date: 02/20/2022   I with updated HEP.   Goal status: ongoing   2.  Pt will perform all basic ADLS mod I Baseline: supervision Goal status: INITIAL   3.  Pt will perform basic home management modified I Baseline: dependent Goal status:  ongoing, folding laundry only   4.  Pt will perform basic cooking mod I demonstrating good safety awareness. Baseline: depdnent Goal status: ongoing   5.  Pt will demonstrate improved fine motor coordination for ADLS as evidenced by decreasing 9 hole peg test score by 3 secs, bilaterally. Baseline: R 35.06, L 38.53 Goal status: INITIAL   6.  Pt will demonstrate ability to retrieve a 3 lbs weight from overhead shelf with left and right UE's  individually demonstrating good control. Baseline: decreased bilateral strength and control. Goal status: ongoing   ASSESSMENT:   CLINICAL IMPRESSION:  *** For the reporting period of 4/11/23523/23 pt demonstrates progress towards  all goals. She met 4/6 short term goals. She can benefit from continued skilled OT to address the following deficits: ataxia, decreased coordination, decreased balance, decreased activity tolerance, pain  in order to maximize pt's safety and I with daily activities.   PERFORMANCE DEFICITS in functional skills including ADLs, IADLs, coordination, dexterity, sensation, ROM, strength, pain, fascial restrictions, flexibility, FMC, GMC, mobility, balance, endurance, decreased knowledge of precautions, decreased knowledge of use of DME, vision, and UE functional use, cognitive skills including attention, energy/drive, learn,  memory, problem solving, safety awareness, temperament/personality, thought, and understand, and psychosocial skills including coping strategies, environmental adaptation, and routines and behaviors. Pt can benefit from skilled OT to address these deficits.   IMPAIRMENTS are limiting patient from ADLs, IADLs, rest and sleep, play, leisure, and social participation.    COMORBIDITIES may have co-morbidities  that affects occupational performance. Patient will benefit from skilled OT to address above impairments and improve overall function.   MODIFICATION OR ASSISTANCE TO COMPLETE EVALUATION: No modification of tasks or assist necessary to complete an evaluation.   OT OCCUPATIONAL PROFILE AND HISTORY: Detailed assessment: Review of records and additional review of physical, cognitive, psychosocial history related to current functional performance.   CLINICAL DECISION MAKING: LOW - limited treatment options, no task modification necessary   REHAB POTENTIAL: Good   EVALUATION COMPLEXITY: Low     PLAN: OT FREQUENCY: 2x/week   OT DURATION: 12 weeks plus eval   PLANNED INTERVENTIONS: self care/ADL training, therapeutic exercise, therapeutic activity, neuromuscular re-education, manual therapy, manual lymph drainage, passive range of motion, gait training, balance training, functional mobility training, aquatic therapy, splinting, electrical stimulation, ultrasound, paraffin, fluidotherapy, moist heat, cryotherapy, contrast bath, patient/family education, cognitive remediation/compensation, visual/perceptual remediation/compensation, energy conservation, coping strategies training, and DME and/or AE instructions   RECOMMENDED OTHER SERVICES: n/a   CONSULTED AND AGREED WITH PLAN OF CARE: Patient   PLAN FOR NEXT SESSION:   standing and functional reach, simple home management, pt has ataxia R>L     Heidi Lemay, OTR/L               01/10/2022, 10:12 PM   ***

## 2022-01-11 ENCOUNTER — Ambulatory Visit: Payer: Medicare HMO | Admitting: Speech Pathology

## 2022-01-11 ENCOUNTER — Encounter: Payer: Self-pay | Admitting: Physical Therapy

## 2022-01-11 ENCOUNTER — Ambulatory Visit: Payer: Medicare HMO | Admitting: Occupational Therapy

## 2022-01-11 ENCOUNTER — Ambulatory Visit: Payer: Medicare HMO | Admitting: Physical Therapy

## 2022-01-11 ENCOUNTER — Encounter: Payer: Self-pay | Admitting: Occupational Therapy

## 2022-01-11 DIAGNOSIS — R41841 Cognitive communication deficit: Secondary | ICD-10-CM | POA: Diagnosis not present

## 2022-01-11 DIAGNOSIS — R41844 Frontal lobe and executive function deficit: Secondary | ICD-10-CM | POA: Diagnosis not present

## 2022-01-11 DIAGNOSIS — R278 Other lack of coordination: Secondary | ICD-10-CM | POA: Diagnosis not present

## 2022-01-11 DIAGNOSIS — R2689 Other abnormalities of gait and mobility: Secondary | ICD-10-CM | POA: Diagnosis not present

## 2022-01-11 DIAGNOSIS — R208 Other disturbances of skin sensation: Secondary | ICD-10-CM

## 2022-01-11 DIAGNOSIS — M6281 Muscle weakness (generalized): Secondary | ICD-10-CM | POA: Diagnosis not present

## 2022-01-11 DIAGNOSIS — R2681 Unsteadiness on feet: Secondary | ICD-10-CM

## 2022-01-11 DIAGNOSIS — R471 Dysarthria and anarthria: Secondary | ICD-10-CM

## 2022-01-11 DIAGNOSIS — R41840 Attention and concentration deficit: Secondary | ICD-10-CM

## 2022-01-11 DIAGNOSIS — J3081 Allergic rhinitis due to animal (cat) (dog) hair and dander: Secondary | ICD-10-CM | POA: Diagnosis not present

## 2022-01-11 DIAGNOSIS — R4701 Aphasia: Secondary | ICD-10-CM

## 2022-01-11 DIAGNOSIS — J301 Allergic rhinitis due to pollen: Secondary | ICD-10-CM | POA: Diagnosis not present

## 2022-01-11 DIAGNOSIS — J3089 Other allergic rhinitis: Secondary | ICD-10-CM | POA: Diagnosis not present

## 2022-01-11 NOTE — Therapy (Signed)
OUTPATIENT PHYSICAL THERAPY TREATMENT NOTE   Patient Name: Virginia Luna MRN: [id number] DOB:August 09, 1961, 61 y.o., female Today's Date: 01/11/2022  PCP: Shirline Frees, MD REFERRING PROVIDER: Courtney Heys, MD    END OF SESSION:   PT End of Session - 01/11/22 0934     Visit Number 9    Number of Visits 13    Date for PT Re-Evaluation 02/23/22    Authorization Type [employer]IC[employer]    Progress Note Due on Visit 10    PT Start Time 0933    PT Stop Time 1015    PT Time Calculation (min) 42 min    Equipment Utilized During Treatment Gait belt    Activity Tolerance Patient tolerated treatment well    Behavior During Therapy WFL for tasks assessed/performed              Past Medical History:  Diagnosis Date   Allergies    Anemia    Arthritis    Asthma    Back pain    Chronic pain    Complication of anesthesia    woke up during colonoscopy   Diabetes ([place])    Diabetes mellitus without complication (HCC)    Edema, lower extremity    Fibroid    Fibromyalgia    Chronic   GERD (gastroesophageal reflux disease)    Headache    migraines   High blood pressure    History of hiatal hernia    History of stomach ulcers    IBS (irritable bowel syndrome)    Joint pain    Sleep apnea    did use cpap-lost 25lb-says she does not need it   NO CPAP   Wears contact lenses    Past Surgical History:  Procedure Laterality Date   BRAIN SURGERY Left 11/07/2021   [facility] Dr. Herschel [place]   BREAST BIOPSY     BREAST EXCISIONAL BIOPSY     BREAST LUMPECTOMY WITH RADIOACTIVE SEED LOCALIZATION Bilateral 11/24/2014   Procedure: BILATERAL BREAST LUMPECTOMY WITH RADIOACTIVE SEED LOCALIZATION;  Surgeon: [name], MD;  Location: [facility];  Service: General;  Laterality: Bilateral;   CHOLECYSTECTOMY     COLONOSCOPY     DILATION AND CURETTAGE OF UTERUS     ESOPHAGOGASTRODUODENOSCOPY (EGD) WITH PROPOFOL N/A 07/23/2016   Procedure: ESOPHAGOGASTRODUODENOSCOPY (EGD)  WITH PROPOFOL;  Surgeon: Laurence Spates, MD;  [facility];  Service: Endoscopy;  Laterality: N/A;   ESOPHAGOGASTRODUODENOSCOPY (EGD) WITH PROPOFOL N/A 06/11/2018   Procedure: ESOPHAGOGASTRODUODENOSCOPY (EGD) WITH PROPOFOL;  Surgeon: Laurence Spates, MD;  [facility];  Service: Endoscopy;  Laterality: N/A;   LUMBAR LAMINECTOMY  2010   ORIF WRIST FRACTURE Right 11/24/2020   Procedure: OPEN REDUCTION INTERNAL FIXATION RIGHT WRIST FRACTURE;  Surgeon: Renette Butters, MD;  [facility];  Service: Orthopedics;  Laterality: Right;   UMBILICAL HERNIA REPAIR     age 62   UPPER GI ENDOSCOPY     Patient Active Problem List   Diagnosis Date Noted   Cerebellar tumor ([place]) 11/15/2021   Comorbid sleep-related hypoventilation 10/19/2021   Urinary incontinence/areflexic bladder 10/05/2021   OSA, unable to tolerate CPAP (vomited every night) 09/05/2021   Chronic pain syndrome 09/05/2021   Chronic nausea 09/05/2021   Neurogenic bladder, followed by Urology, instructed I&O cath BID 09/05/2021   Intolerance of continuous positive airway pressure (CPAP) ventilation 09/03/2021   Insomnia secondary to chronic pain 07/24/2021   Primary osteoarthritis of right knee 07/03/2021   Myofascial pain dysfunction syndrome 07/03/2021  Fibromyalgia 07/03/2021   Hyponatremia 07/03/2021   Primary osteoarthritis of left knee 07/03/2021   Body mass index (BMI) 50.0-59.9, adult ([place]) 03/07/2021   Allergic rhinitis due to animal (cat) (dog) hair and dander 10/06/2020   Allergic rhinitis due to pollen 10/06/2020   Moderate persistent asthma, uncomplicated 25/63/8937   Cerebellar mass 10/06/2020   Paresthesia 05/27/2020   Diabetes mellitus ([place]) 05/25/2020   Hypertension associated with diabetes ([place]) 05/25/2020   Hyperlipidemia associated with type 2 diabetes mellitus ([place]) 05/25/2020   Vitamin D deficiency 05/25/2020   NAFLD (nonalcoholic fatty liver disease) 05/25/2020   Sciatica of left side  05/25/2020    REFERRING DIAG: D49.6 (ICD-10-CM) - Neoplasm of unspecified behavior of brain    THERAPY DIAG:  Other lack of coordination  Other abnormalities of gait and mobility  Unsteadiness on feet  Muscle weakness (generalized)  PERTINENT HISTORY: From H&P note on 11/15/2021:  "On the day of admission 11/07/2021, the patient was taken to the operating room by Dr. Recardo Evangelist of neurosurgery and underwent left suboccipital craniotomy, resection of cerebellar tumor. She tolerated the procedure well. Postoperative MRI performed on 3/22 revealed a small area of diffusion restriction in the left cerebellum."  Patient transferred to [facility] on 11/15/2021, discharged 11/24/2021 per pt.  fibromyalgia, DM2, asthma, HTN, OSA, migraines, bilateral knee OA, myofascial pain syndrome    PRECAUTIONS: Fall  SUBJECTIVE: States she is having a good day.  Speech was hard for her but she's glad she did it and made it to PT.  PAIN:  She endorses same pain from prior session. Are you having pain? Yes: NPRS scale: 8/10 Pain location: All over (fibromyalgia), worse in head and neck and BLE (LLE worse)  - she feels a cutting-like sensation in the left posterior part of her head not quite over the incision (incision intact and pink)  Head is 8.5/10 and body is 8/10. OBJECTIVE:    DIAGNOSTIC FINDINGS: From Brain MRI on 09/02/2021:  "Increased size of left superior cerebellar vermis mass, now measuring 1.5 x 1.5 cm, previously 1.1 x 1.0 cm. Increased mild surrounding edema."  Future brain MRI ordered w/o date visible at this time.   COGNITION: Overall cognitive status: No family/caregiver present to determine baseline cognitive functioning - pt states "I feel stupid", she elaborates that she is having a hard time processing.     TODAY'S TREATMENT:  THEREX: -SciFit hills mode L3.5 x8 minutes BLEs only for reciprocal movement, dynamic warmup and LE strengthening. -6" stair step taps x10 each LE> 6" stair step  ups 2x12 each LE -Added to HEP.  See bolded below.  Advised pt to do standing heel raises when having a good day like today and regress to seated when fatigued and to do no more than 8 reps for each set when standing as quality of ROM declines after that.  NMR: -Standing EOM w/ 1.1lb weighted ball performing forward reach outside BOS and tapping cone in chair x20 >taps to cone on floor x10; cued for return to upright and core engagement  PATIENT EDUCATION: Education details: Additions to HEP. Person educated: Patient Education method: Explanation  Education comprehension: verbalized understanding and needs further education     HOME EXERCISE PROGRAM: Access Code: [email or web address] URL: [email or web address] Date: 12/06/2021 Prepared by: Mickie Bail Plaster  Exercises - Sit to Stand Without Arm Support  - 1 x daily - 7 x weekly - 3 sets - 10 reps - Standing March with Counter Support  - 1 x daily - 7 x  weekly - 3 sets - 10 reps - Chin Tuck  - 1 x daily - 7 x weekly - 3 sets - 10 reps - Seated Cervical Sidebending Stretch  - 1 x daily - 7 x weekly - 3 sets - 10 reps  - Supine Bridge  - 1 x daily - 4 x weekly - 3 sets - 10 reps - Supine Active Straight Leg Raise  - 1 x daily - 4 x weekly - 2 sets - 8 reps - Forward and Backward Monster Walk with Counter Support  - 1 x daily - 4 x weekly - 3 sets - 10 reps - Seated Heel Raise  - 1 x daily - 4 x weekly - 2 sets - 12 reps - Single Leg Stance on Pillow  - 1 x daily - 4 x weekly - 1 sets - 4 reps   - Heel Raises with Counter Support  - 1 x daily - 4 x weekly - 2 sets - 8 reps  GOALS: Goals reviewed with patient? Yes   SHORT TERM GOALS: Target date:  01/12/2022   Pt will be independent with initial strength and balance HEP with supervision from family. Baseline:  Established 12/06/2021, pt compliant intermittently Goal status: PARTIALLY MET   2.  Pt will decrease 5xSTS to <35 seconds w/ BUE support in order to demonstrate decreased risk  for falls and improved functional bilateral LE strength and power. Baseline: 38.51sec w/ BUE; 21.94 sec w/ BUE support Goal status: MET   3.  Pt will demonstrate a gait speed of >1 feet/sec in order to decrease risk for falls. Baseline: 0.76 ft/sec;  3.03 ft/sec w/ RW Goal status: MET   4.  Pt will improve normal TUG to less than or equal to 30 seconds w/LRAD for improved functional mobility and decreased fall risk.  Baseline: 34.87s w/RW and S*; 18.91 seconds w/ RW and S* Goal status: MET   5.  Pt will improve Berg score to 41/56 for decreased fall risk  Baseline: 35/56 on 4/19; 38/56 Goal status: PARTIALLY MET   LONG TERM GOALS: Target date:  02/23/2022   Pt will report return to some form of aerobic activity with maintenance of advanced HEP for strength and balance. Baseline: To be established Goal status: INITIAL   2.  Pt will decrease 5xSTS to <20 seconds w/ BUE support in order to demonstrate decreased risk for falls and improved functional bilateral LE strength and power. Baseline: 38.51 sec w/ BUE support; 21.94 seconds 01/09/2022 Goal status: REVISED   3.  Pt will demonstrate a gait speed of >3.3 feet/sec in order to decrease risk for falls. Baseline: 0.76 ft/sec; 3.03 ft/sec w/ RW Goal status: REVISED   4.  Pt will improve normal TUG to less than or equal to 16 seconds w/LRAD for improved functional mobility and decreased fall risk.  Baseline: 34.87s w/RW and S*; 18.91s w/ RW and S* Goal status: REVISED   5.  Pt will improve Berg score to 48/56 for decreased fall risk  Baseline: 35/56 on 4/19 Goal status: INITIAL   6.  Pt will tolerate >65mns of continuous activity in order to promote activity tolerance and endurance. Baseline: Pt extremely fatigued during session, nodding off intermittently. Goal status: INITIAL   ASSESSMENT:   CLINICAL IMPRESSION: Focus of skilled session on modifying HEP to add increased challenge and variations of current exercises.   Progressed to functional reach outside BOS and to floor w/ pt demonstrating no loss of balance.  She continues to be limited by generalized LE weakness evident in stair step ups using increased UE reliance.  Will continue to address deficits and progress towards goals as able.    OBJECTIVE IMPAIRMENTS Abnormal gait, decreased activity tolerance, decreased balance, decreased cognition, decreased coordination, decreased endurance, decreased knowledge of condition, decreased knowledge of use of DME, decreased mobility, difficulty walking, decreased ROM, decreased strength, and decreased safety awareness.    ACTIVITY LIMITATIONS cleaning, community activity, driving, meal prep, laundry, and shopping.    PERSONAL FACTORS Age, Behavior pattern, Fitness, and 3+ comorbidities: fibromyalgia, DM2, asthma, HTN, OSA, migraines, bilateral knee OA, myofascial pain syndrome  are also affecting patient's functional outcome.      REHAB POTENTIAL: Fair hx of fibromyalgia, DM2, asthma, HTN, OSA, migraines, bilateral knee OA, myofascial pain syndrome , personal factors   CLINICAL DECISION MAKING: Evolving/moderate complexity   EVALUATION COMPLEXITY: Moderate   PLAN: PT FREQUENCY: 1x/week   PT DURATION: 12 weeks   PLANNED INTERVENTIONS: Therapeutic exercises, Therapeutic activity, Neuromuscular re-education, Balance training, Gait training, Patient/Family education, Joint mobilization, Stair training, Vestibular training, DME instructions, Manual therapy, and self care and home management   PLAN FOR NEXT SESSION: Use SciFit for endurance and strengthening w/ BUE/BLE.  Gait w/ rollator vs RW  Add to/revise HEP as needed. Single leg stability-on pillow (add to HEP), endurance, BLE strength,  monster walks-add resistance, gait training w/ RW management, 6" step ups - dec UE support, quad strengthening, work on backwards walking, reaching outside BOS-forward reach to floor, lateral stepping     Bary Richard, PT, DPT 01/11/2022, 10:34 AM

## 2022-01-11 NOTE — Therapy (Signed)
OUTPATIENT OCCUPATIONAL THERAPY TREATMENT NOTE   Patient Name: Virginia Luna MRN: [id number] DOB:1960/11/10, 61 y.o., female Today's Date: 01/11/2022  PCP: Virginia Frees, MD REFERRING PROVIDER: Dr.Lovorn/ Virginia Grill PA-C   END OF SESSION:   OT End of Session - 01/11/22 0849     Visit Number 11    Number of Visits 25    Date for OT Re-Evaluation 02/20/22    Authorization Type [employer]    Authorization - Visit Number 11    Authorization - Number of Visits 25   by 02/20/22   Progress Note Due on Visit 20    OT Start Time 0849    OT Stop Time 0930    OT Time Calculation (min) 41 min    Activity Tolerance Patient tolerated treatment well    Behavior During Therapy WFL for tasks assessed/performed                 Past Medical History:  Diagnosis Date   Allergies    Anemia    Arthritis    Asthma    Back pain    Chronic pain    Complication of anesthesia    woke up during colonoscopy   Diabetes ([place])    Diabetes mellitus without complication (HCC)    Edema, lower extremity    Fibroid    Fibromyalgia    Chronic   GERD (gastroesophageal reflux disease)    Headache    migraines   High blood pressure    History of hiatal hernia    History of stomach ulcers    IBS (irritable bowel syndrome)    Joint pain    Sleep apnea    did use cpap-lost 25lb-says she does not need it   NO CPAP   Wears contact lenses    Past Surgical History:  Procedure Laterality Date   BRAIN SURGERY Left 11/07/2021   [facility] Dr. Herschel Luna   BREAST BIOPSY     BREAST EXCISIONAL BIOPSY     BREAST LUMPECTOMY WITH RADIOACTIVE SEED LOCALIZATION Bilateral 11/24/2014   Procedure: BILATERAL BREAST LUMPECTOMY WITH RADIOACTIVE SEED LOCALIZATION;  Surgeon: Virginia Luna, MD;  Location: [place];  Service: General;  Laterality: Bilateral;   CHOLECYSTECTOMY     COLONOSCOPY     DILATION AND CURETTAGE OF UTERUS     ESOPHAGOGASTRODUODENOSCOPY (EGD) WITH PROPOFOL N/A  07/23/2016   Procedure: ESOPHAGOGASTRODUODENOSCOPY (EGD) WITH PROPOFOL;  Surgeon: Virginia Spates, MD;  [facility];  Service: Endoscopy;  Laterality: N/A;   ESOPHAGOGASTRODUODENOSCOPY (EGD) WITH PROPOFOL N/A 06/11/2018   Procedure: ESOPHAGOGASTRODUODENOSCOPY (EGD) WITH PROPOFOL;  Surgeon: Virginia Spates, MD;  Location: [facility];  Service: Endoscopy;  Laterality: N/A;   LUMBAR LAMINECTOMY  2010   ORIF WRIST FRACTURE Right 11/24/2020   Procedure: OPEN REDUCTION INTERNAL FIXATION RIGHT WRIST FRACTURE;  Surgeon: Virginia Butters, MD;  Location: [facility];  Service: Orthopedics;  Laterality: Right;   UMBILICAL HERNIA REPAIR     age 86   UPPER GI ENDOSCOPY     Patient Active Problem List   Diagnosis Date Noted   Cerebellar tumor ([place]) 11/15/2021   Comorbid sleep-related hypoventilation 10/19/2021   Urinary incontinence/areflexic bladder 10/05/2021   OSA, unable to tolerate CPAP (vomited every night) 09/05/2021   Chronic pain syndrome 09/05/2021   Chronic nausea 09/05/2021   Neurogenic bladder, followed by Urology, instructed I&O cath BID 09/05/2021   Intolerance of continuous positive airway pressure (CPAP) ventilation 09/03/2021   Insomnia secondary to chronic pain 07/24/2021  Primary osteoarthritis of right knee 07/03/2021   Myofascial pain dysfunction syndrome 07/03/2021   Fibromyalgia 07/03/2021   Hyponatremia 07/03/2021   Primary osteoarthritis of left knee 07/03/2021   Body mass index (BMI) 50.0-59.9, adult ([place]) 03/07/2021   Allergic rhinitis due to animal (cat) (dog) hair and dander 10/06/2020   Allergic rhinitis due to pollen 10/06/2020   Moderate persistent asthma, uncomplicated 30/16/0109   Cerebellar mass 10/06/2020   Paresthesia 05/27/2020   Diabetes mellitus ([place]) 05/25/2020   Hypertension associated with diabetes ([place]) 05/25/2020   Hyperlipidemia associated with type 2 diabetes mellitus ([place]) 05/25/2020   Vitamin D deficiency 05/25/2020   NAFLD (nonalcoholic  fatty liver disease) 05/25/2020   Sciatica of left side 05/25/2020    ONSET DATE: 11/07/21   REFERRING DIAG: cerebellar tumor, s/p craniotomy   THERAPY DIAG:  Attention and concentration deficit  Other disturbances of skin sensation  Other lack of coordination  Frontal lobe and executive function deficit  Other abnormalities of gait and mobility  Unsteadiness on feet  Muscle weakness (generalized)   PERTINENT HISTORY: Virginia Luna is a 60 year old female who recently presented to hospitalized at [facility] in [facility] complaining of headaches and tremors. 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 She was admitted 11/07/2021, underwent left suboccipital craniotomy, resection of cerebellar tumor. Postoperative MRI performed on 3/22 revealed a small area of diffusion restriction in the left cerebellum Pt was transferred to  [facility] 11/15/21 and  she was d/c home 11/24/21.PMH: arthritis, asthma, chronic pain, DM, fibromyalgia, myofascial pain syndrome, HTN, IBS, sleep apnea Brain   PRECAUTIONS: Fall, no driving  SUBJECTIVE: "I was supposed to bring my putty and I forgot cause I had to bring my epipen"  PAIN:  Are you having pain? Yes: NPRS scale: 8/10 Pain location: head and neck, and legs (all over, generalized) Pain description: constant Aggravating factors: surgery, fibromyalgia, myofascial pain syndrome Relieving factors: meds    OBJECTIVE:   TODAY'S TREATMENT:   01/11/22 Resistance Clothespins 1-8# with RUE for mid and high functional reaching and sustained pinch. Pt req'd min cues for movement pattern and maintaining good positioning of RUE with reach.   36 pc puzzle in standing with using unilateral UE for support. Increased time req'd for completion of puzzle and patient req'd intermittent rest breaks in sitting.  Connect Four with repetitive reaching with BUE and placing into frame/board. Pt with  good activity tolerance today. Reports it's a good day.   HOME EXERCISE PROGRAM: putty HEP, coordination HEP, sh flex supine (12/06/21); BUE HEP (12/14/21)   GOALS:     SHORT TERM GOALS: Target date: 12/26/2021   I with HEP  Goal status: achieved   2.  Pt will perform dressing mod I in a reasonable amount of time Baseline: supervision, increased time required Goal status: GOAL MET pt reports completing dressing with supervision, 12/27/21   3.  Pt will perform basic home management with min A. Baseline: dependent Goal status: GOAL MET reports doing ironing 12/27/21   4.  Pt will perform simple cooking task with min a demonstrating good safety awareness. Baseline: dependent Goal status:  ongoing, min A in clinic, min-mod v.c for walker safety   5.  Pt will increase bilateral grip strength by 5 lbs for increased ease opening containers. Baseline RUE 21.1 lbs, LUE 14.9 lbs :  Goal status: GOAL MET RUE: 35.4   LUE 37.4   6.  Pt will demonstrate ability to stand for  functional/ ADL tasks x 15 mins without rest break and no LOB. Baseline: poor and inconsistent endurance Goal status:  ONGOING  tolerated standing for 12.5 minutes in clinic 12/27/21   LONG TERM GOALS: Target date: 02/20/2022   I with updated HEP.   Goal status: ongoing   2.  Pt will perform all basic ADLS mod I Baseline: supervision Goal status: INITIAL   3.  Pt will perform basic home management modified I Baseline: dependent Goal status:  ongoing, folding laundry only   4.  Pt will perform basic cooking mod I demonstrating good safety awareness. Baseline: depdnent Goal status: ongoing   5.  Pt will demonstrate improved fine motor coordination for ADLS as evidenced by decreasing 9 hole peg test score by 3 secs, bilaterally. Baseline: R 35.06, L 38.53 Goal status: INITIAL   6.  Pt will demonstrate ability to retrieve a 3 lbs weight from overhead shelf with left and right UE's individually demonstrating good  control. Baseline: decreased bilateral strength and control. Goal status: ongoing   ASSESSMENT:   CLINICAL IMPRESSION:  Pt reported a good day with  moe energy today. Tolerated session well.   PERFORMANCE DEFICITS in functional skills including ADLs, IADLs, coordination, dexterity, sensation, ROM, strength, pain, fascial restrictions, flexibility, FMC, GMC, mobility, balance, endurance, decreased knowledge of precautions, decreased knowledge of use of DME, vision, and UE functional use, cognitive skills including attention, energy/drive, learn, memory, problem solving, safety awareness, temperament/personality, thought, and understand, and psychosocial skills including coping strategies, environmental adaptation, and routines and behaviors. Pt can benefit from skilled OT to address these deficits.   IMPAIRMENTS are limiting patient from ADLs, IADLs, rest and sleep, play, leisure, and social participation.    COMORBIDITIES may have co-morbidities  that affects occupational performance. Patient will benefit from skilled OT to address above impairments and improve overall function.   MODIFICATION OR ASSISTANCE TO COMPLETE EVALUATION: No modification of tasks or assist necessary to complete an evaluation.   OT OCCUPATIONAL PROFILE AND HISTORY: Detailed assessment: Review of records and additional review of physical, cognitive, psychosocial history related to current functional performance.   CLINICAL DECISION MAKING: LOW - limited treatment options, no task modification necessary   REHAB POTENTIAL: Good   EVALUATION COMPLEXITY: Low     PLAN: OT FREQUENCY: 2x/week   OT DURATION: 12 weeks plus eval   PLANNED INTERVENTIONS: self care/ADL training, therapeutic exercise, therapeutic activity, neuromuscular re-education, manual therapy, manual lymph drainage, passive range of motion, gait training, balance training, functional mobility training, aquatic therapy, splinting, electrical  stimulation, ultrasound, paraffin, fluidotherapy, moist heat, cryotherapy, contrast bath, patient/family education, cognitive remediation/compensation, visual/perceptual remediation/compensation, energy conservation, coping strategies training, and DME and/or AE instructions   RECOMMENDED OTHER SERVICES: n/a   CONSULTED AND AGREED WITH PLAN OF CARE: Patient   PLAN FOR NEXT SESSION:   continue with home management and standing activities for activity tolerance with reaching     Zachery Conch, OTR/L               01/11/2022, 9:21 AM

## 2022-01-11 NOTE — Therapy (Signed)
OUTPATIENT SPEECH LANGUAGE PATHOLOGY TREATMENT NOTE   Patient Name: Virginia Luna MRN: [id number] DOB:1961/03/21, 61 y.o., female Today's Date: 01/11/2022  PCP: Shirline Frees, MD REFERRING PROVIDER: Barbie Banner, PA-C   END OF SESSION:   End of Session - 01/11/22 0800     Visit Number 10    Number of Visits 25    Date for SLP Re-Evaluation 02/20/22    Authorization Type [employer]    Progress Note Due on Visit 10    SLP Start Time 0800    SLP Stop Time  0845    SLP Time Calculation (min) 45 min    Activity Tolerance Patient tolerated treatment well                  Past Medical History:  Diagnosis Date   Allergies    Anemia    Arthritis    Asthma    Back pain    Chronic pain    Complication of anesthesia    woke up during colonoscopy   Diabetes ([place])    Diabetes mellitus without complication (HCC)    Edema, lower extremity    Fibroid    Fibromyalgia    Chronic   GERD (gastroesophageal reflux disease)    Headache    migraines   High blood pressure    History of hiatal hernia    History of stomach ulcers    IBS (irritable bowel syndrome)    Joint pain    Sleep apnea    did use cpap-lost 25lb-says she does not need it   NO CPAP   Wears contact lenses    Past Surgical History:  Procedure Laterality Date   BRAIN SURGERY Left 11/07/2021   [facility] Dr. Herschel [place]   BREAST BIOPSY     BREAST EXCISIONAL BIOPSY     BREAST LUMPECTOMY WITH RADIOACTIVE SEED LOCALIZATION Bilateral 11/24/2014   Procedure: BILATERAL BREAST LUMPECTOMY WITH RADIOACTIVE SEED LOCALIZATION;  Surgeon: [name], MD;  Location: [facility];  Service: General;  Laterality: Bilateral;   CHOLECYSTECTOMY     COLONOSCOPY     DILATION AND CURETTAGE OF UTERUS     ESOPHAGOGASTRODUODENOSCOPY (EGD) WITH PROPOFOL N/A 07/23/2016   Procedure: ESOPHAGOGASTRODUODENOSCOPY (EGD) WITH PROPOFOL;  Surgeon: Laurence Spates, MD;  [facility];  Service: Endoscopy;   Laterality: N/A;   ESOPHAGOGASTRODUODENOSCOPY (EGD) WITH PROPOFOL N/A 06/11/2018   Procedure: ESOPHAGOGASTRODUODENOSCOPY (EGD) WITH PROPOFOL;  Surgeon: Laurence Spates, MD;  [facility];  Service: Endoscopy;  Laterality: N/A;   LUMBAR LAMINECTOMY  2010   ORIF WRIST FRACTURE Right 11/24/2020   Procedure: OPEN REDUCTION INTERNAL FIXATION RIGHT WRIST FRACTURE;  Surgeon: Renette Butters, MD;  [facility];  Service: Orthopedics;  Laterality: Right;   UMBILICAL HERNIA REPAIR     age 14   UPPER GI ENDOSCOPY     Patient Active Problem List   Diagnosis Date Noted   Cerebellar tumor ([place]) 11/15/2021   Comorbid sleep-related hypoventilation 10/19/2021   Urinary incontinence/areflexic bladder 10/05/2021   OSA, unable to tolerate CPAP (vomited every night) 09/05/2021   Chronic pain syndrome 09/05/2021   Chronic nausea 09/05/2021   Neurogenic bladder, followed by Urology, instructed I&O cath BID 09/05/2021   Intolerance of continuous positive airway pressure (CPAP) ventilation 09/03/2021   Insomnia secondary to chronic pain 07/24/2021   Primary osteoarthritis of right knee 07/03/2021   Myofascial pain dysfunction syndrome 07/03/2021   Fibromyalgia 07/03/2021   Hyponatremia 07/03/2021   Primary osteoarthritis of left knee  07/03/2021   Body mass index (BMI) 50.0-59.9, adult ([place]) 03/07/2021   Allergic rhinitis due to animal (cat) (dog) hair and dander 10/06/2020   Allergic rhinitis due to pollen 10/06/2020   Moderate persistent asthma, uncomplicated 24/58/0998   Cerebellar mass 10/06/2020   Paresthesia 05/27/2020   Diabetes mellitus ([place]) 05/25/2020   Hypertension associated with diabetes ([place]) 05/25/2020   Hyperlipidemia associated with type 2 diabetes mellitus ([place]) 05/25/2020   Vitamin D deficiency 05/25/2020   NAFLD (nonalcoholic fatty liver disease) 05/25/2020   Sciatica of left side 05/25/2020    ONSET DATE: March 2023 (surgery 11/07/2021)  REFERRING DIAG: D49.6  (ICD-10-CM) - Cerebellar tumor ([place])   THERAPY DIAG:  Aphasia  Cognitive communication deficit  Dysarthria and anarthria  SUBJECTIVE: "  PAIN:  Are you having pain? Yes NPRS scale: 8.5/10 Pain location: "head"  Speech Therapy Progress Note  Dates of Reporting Period: 11-28-21 to 01-11-22  Objective Reports of Subjective Statement: Pt has been seen for 10 visits and received therapy using skilled interventions to address dysarthria, cognition, and word finding. Pt reports mild improvements in cognition and speech production. Tells ST speech therapy is challenging yet beneficial.    Objective Measurements: Pt demonstrating moderate improvement in memory, attention, and executive functioning. Ability to attend to therapy tasks and demonstrate meta-cognitive strategies for functional activities. Continues to demonstrate vague language and occasional word finding. Mild improvement in using strategies to support conversational speech. Mild improvements in motor speech, increasing ability to utilize speaking strategies to enhance clarity and intelligibility of speech.   Goal Update: see below  Plan: continue per plan of care.   Reason Skilled Services are Required: Skilled ST is indicated to address dysarthria, cognition, and aphasia to enhance quality of life, enable increased independence in functional activities, and reduce caregiver burden.   OBJECTIVE:   TODAY'S TREATMENT: SKILLED INTERVENTIONS:  01-11-22: Target compensations pt can implement to resume reading. Suggest using audio books, magnifying glass, ruler to guide the eyes. Education on compensations and strategies to use in conversational speech to clearly communicate thoughts and ideas. During unstructured conversation, usual mod-A to utilize strategies for coherent, complete thoughts. Completed defining homographs activity. 75% accuracy, requires initial mod-A, faded to mod-I for accurate answers.   01-09-22: Discussion on  pt's involvement in household activities. Reports trying to increase activities, including paying bills. Reports some frustration with task. Education on implementation of stop-think-do to mitigate barriers and prepare for successful completion of task. Practiced using personally relevant tasks. Occasional feedback on implementation of compensations and external aids to enhance success. IND for sequencing steps. Led pt through synonym task as anomia strategy. 60% accuracy, usual mod-A required.    PATIENT EDUCATION: Education details: see above Person educated: Patient Education method: Explanation, Demonstration, and Handouts Education comprehension: verbalized understanding, returned demonstration, and needs further education     GOALS: Goals reviewed with patient? Yes   SHORT TERM GOALS: Target date: 12/27/2021   Pt will complete standardized cognitive assessment to better appreciate reported cognitive deficits within first 2 sessions.  Baseline: 12-06-21, 12-12-21 Goal status: goal met   2.  Pt will verbalize and implement 2 attention and memory compensations to aid daily functioning given occasional min A over 2 sessions Baseline: 12-19-21; 12-26-21 Goal status: goal met   3.  Pt will demonstrate successful usage of anomia compensations, with occasional min-A, to make 5 minute mod complex conversation functional over 2 sessions Baseline: 12-21-21, 12-26-21 Goal status: goal met   4.  Pt will demonstrate dysarthria  compensations on structured speech tasks with 80% accuracy given occasional min A over 2 sessions Baseline: 12-21-21, 12-26-21 Goal status: goal met     LONG TERM GOALS: Target date: 02/21/2022   Pt will verbalize x3 functional areas in which implementation of attention/memory compensations/strategies have resulted in increased successful participation in desired activities.  Baseline:  Goal status: ongoing   2.  Pt will report completion of dysarthria HEP at least 1x/day (rec  BID) over 1 week period with rare min-A.  Baseline:  Goal status: ongoing   3.  Pt will report improvement on CPIB by 3 points by d/c.  Baseline:  Goal status: ongoing   4.  Pt will utilize anomia compensations when word finding occurs in 20+ minute conversation with rare min A Baseline:  Goal status: ongoing     ASSESSMENT:   CLINICAL IMPRESSION: Patient is a 61 y.o. F who was seen today for cognitive communication deficits related to cognition, speech, and language s/p cerebellar tumor resection and CVA. Pt demonstrates significantly impacted processing, requiring extended time to process visual and auditory information and form appropriate responses in conversation. Auditory processing impairments observed in y/n questions, pt with high accuracy, however requires repetition from ST and to self + extended period to generate response. Anomia demonstrated and frequent use of vague language without awareness. W/o cueing, pt not aware that ST lacks insight into vague references and requires min-A to repair communication breakdowns. Demonstrates memory impairments in simple tasks such as stating months of year, has to repeat to self to come up with "October." Currently residing with God-daughter d/t limited ability to care for self. Currently requires assistance with medication management, managing finances, feeding self, managing schedule. Dysphagia impairments noted from CIR notes, pt reports to using compensations and is able to teach back those to ST. Pain and fatigue likely impacting pt's overall cognitive function, based on pt report. CLQT administration demonstrates moderate impairment in memory, mild impairment in language, low WNL for domains of attention, ex functions, visuospatial skills. Overall severity rating of mild.    OBJECTIVE IMPAIRMENTS include attention, memory, executive functioning, expressive language, receptive language, dysarthria, and dysphagia. These impairments are  limiting patient from managing medications, managing appointments, managing finances, household responsibilities, ADLs/IADLs, effectively communicating at home and in community, and safety when swallowing. No overt factors affecting potential to achieve goals and functional outcomes. Patient will benefit from skilled SLP services to address above impairments and improve overall function.   REHAB POTENTIAL: Good   PLAN: SLP FREQUENCY: 2x/week   SLP DURATION: 12 weeks   PLANNED INTERVENTIONS: Aspiration precaution training, Diet toleration management , Environmental controls, Trials of upgraded texture/liquids, Cueing hierachy, Cognitive reorganization, Internal/external aids, Functional tasks, Multimodal communication approach, SLP instruction and feedback, Compensatory strategies, and Patient/family education  Su Monks, [place] 01/11/2022, 8:01 AM

## 2022-01-11 NOTE — Patient Instructions (Signed)
Access Code: [email or web address] URL: [email or web address] Date: 01/11/2022 Prepared by: Elease Etienne  Exercises - Sit to Stand Without Arm Support  - 1 x daily - 7 x weekly - 3 sets - 10 reps - Standing March with Counter Support  - 1 x daily - 7 x weekly - 3 sets - 10 reps - Chin Tuck  - 1 x daily - 7 x weekly - 3 sets - 10 reps - Seated Cervical Sidebending Stretch  - 1 x daily - 7 x weekly - 3 sets - 10 reps - Supine Bridge  - 1 x daily - 4 x weekly - 3 sets - 10 reps - Supine Active Straight Leg Raise  - 1 x daily - 4 x weekly - 2 sets - 8 reps - Forward and Backward Monster Walk with Counter Support  - 1 x daily - 4 x weekly - 3 sets - 10 reps - Seated Heel Raise  - 1 x daily - 4 x weekly - 2 sets - 12 reps - Single Leg Stance on Pillow  - 1 x daily - 4 x weekly - 1 sets - 4 reps - Heel Raises with Counter Support  - 1 x daily - 4 x weekly - 2 sets - 8 reps

## 2022-01-15 ENCOUNTER — Other Ambulatory Visit: Payer: Self-pay | Admitting: Neurology

## 2022-01-16 ENCOUNTER — Ambulatory Visit: Payer: Medicare HMO | Admitting: Physical Therapy

## 2022-01-16 ENCOUNTER — Ambulatory Visit: Payer: Medicare HMO | Admitting: Speech Pathology

## 2022-01-16 ENCOUNTER — Ambulatory Visit: Payer: Medicare HMO | Admitting: Occupational Therapy

## 2022-01-16 ENCOUNTER — Encounter: Payer: Self-pay | Admitting: Occupational Therapy

## 2022-01-16 ENCOUNTER — Encounter: Payer: Self-pay | Admitting: Physical Therapy

## 2022-01-16 DIAGNOSIS — R471 Dysarthria and anarthria: Secondary | ICD-10-CM | POA: Diagnosis not present

## 2022-01-16 DIAGNOSIS — R2689 Other abnormalities of gait and mobility: Secondary | ICD-10-CM

## 2022-01-16 DIAGNOSIS — M6281 Muscle weakness (generalized): Secondary | ICD-10-CM | POA: Diagnosis not present

## 2022-01-16 DIAGNOSIS — R2681 Unsteadiness on feet: Secondary | ICD-10-CM

## 2022-01-16 DIAGNOSIS — R41844 Frontal lobe and executive function deficit: Secondary | ICD-10-CM

## 2022-01-16 DIAGNOSIS — R278 Other lack of coordination: Secondary | ICD-10-CM

## 2022-01-16 DIAGNOSIS — R4701 Aphasia: Secondary | ICD-10-CM

## 2022-01-16 DIAGNOSIS — R208 Other disturbances of skin sensation: Secondary | ICD-10-CM

## 2022-01-16 DIAGNOSIS — R41840 Attention and concentration deficit: Secondary | ICD-10-CM

## 2022-01-16 DIAGNOSIS — R41841 Cognitive communication deficit: Secondary | ICD-10-CM | POA: Diagnosis not present

## 2022-01-16 NOTE — Therapy (Signed)
OUTPATIENT OCCUPATIONAL THERAPY TREATMENT NOTE   Patient Name: Virginia Luna MRN: [id number] DOB:July 07, 1961, 61 y.o., female Today's Date: 01/16/2022  PCP: Shirline Frees, MD REFERRING PROVIDER: Dr.Lovorn/ Risa Grill PA-C   END OF SESSION:   OT End of Session - 01/16/22 0848     Visit Number 12    Number of Visits 25    Date for OT Re-Evaluation 02/20/22    Authorization Type [employer]    Authorization - Visit Number 12    Authorization - Number of Visits 25   by 02/20/22   Progress Note Due on Visit 20    OT Start Time 0846    OT Stop Time 0926    OT Time Calculation (min) 40 min    Activity Tolerance Patient tolerated treatment well    Behavior During Therapy WFL for tasks assessed/performed                  Past Medical History:  Diagnosis Date   Allergies    Anemia    Arthritis    Asthma    Back pain    Chronic pain    Complication of anesthesia    woke up during colonoscopy   Diabetes ([place])    Diabetes mellitus without complication (HCC)    Edema, lower extremity    Fibroid    Fibromyalgia    Chronic   GERD (gastroesophageal reflux disease)    Headache    migraines   High blood pressure    History of hiatal hernia    History of stomach ulcers    IBS (irritable bowel syndrome)    Joint pain    Sleep apnea    did use cpap-lost 25lb-says she does not need it   NO CPAP   Wears contact lenses    Past Surgical History:  Procedure Laterality Date   BRAIN SURGERY Left 11/07/2021   [facility] Dr. Herschel [place]   BREAST BIOPSY     BREAST EXCISIONAL BIOPSY     BREAST LUMPECTOMY WITH RADIOACTIVE SEED LOCALIZATION Bilateral 11/24/2014   Procedure: BILATERAL BREAST LUMPECTOMY WITH RADIOACTIVE SEED LOCALIZATION;  Surgeon: [name], MD;  Location: [facility];  Service: General;  Laterality: Bilateral;   CHOLECYSTECTOMY     COLONOSCOPY     DILATION AND CURETTAGE OF UTERUS     ESOPHAGOGASTRODUODENOSCOPY (EGD) WITH PROPOFOL N/A  07/23/2016   Procedure: ESOPHAGOGASTRODUODENOSCOPY (EGD) WITH PROPOFOL;  Surgeon: Laurence Spates, MD;  [facility];  Service: Endoscopy;  Laterality: N/A;   ESOPHAGOGASTRODUODENOSCOPY (EGD) WITH PROPOFOL N/A 06/11/2018   Procedure: ESOPHAGOGASTRODUODENOSCOPY (EGD) WITH PROPOFOL;  Surgeon: Laurence Spates, MD;  [facility];  Service: Endoscopy;  Laterality: N/A;   LUMBAR LAMINECTOMY  2010   ORIF WRIST FRACTURE Right 11/24/2020   Procedure: OPEN REDUCTION INTERNAL FIXATION RIGHT WRIST FRACTURE;  Surgeon: Renette Butters, MD;  Location: [facility];  Service: Orthopedics;  Laterality: Right;   UMBILICAL HERNIA REPAIR     age 27   UPPER GI ENDOSCOPY     Patient Active Problem List   Diagnosis Date Noted   Cerebellar tumor ([place]) 11/15/2021   Comorbid sleep-related hypoventilation 10/19/2021   Urinary incontinence/areflexic bladder 10/05/2021   OSA, unable to tolerate CPAP (vomited every night) 09/05/2021   Chronic pain syndrome 09/05/2021   Chronic nausea 09/05/2021   Neurogenic bladder, followed by Urology, instructed I&O cath BID 09/05/2021   Intolerance of continuous positive airway pressure (CPAP) ventilation 09/03/2021   Insomnia secondary to chronic pain 07/24/2021  Primary osteoarthritis of right knee 07/03/2021   Myofascial pain dysfunction syndrome 07/03/2021   Fibromyalgia 07/03/2021   Hyponatremia 07/03/2021   Primary osteoarthritis of left knee 07/03/2021   Body mass index (BMI) 50.0-59.9, adult ([place]) 03/07/2021   Allergic rhinitis due to animal (cat) (dog) hair and dander 10/06/2020   Allergic rhinitis due to pollen 10/06/2020   Moderate persistent asthma, uncomplicated 09/62/8366   Cerebellar mass 10/06/2020   Paresthesia 05/27/2020   Diabetes mellitus ([place]) 05/25/2020   Hypertension associated with diabetes ([place]) 05/25/2020   Hyperlipidemia associated with type 2 diabetes mellitus ([place]) 05/25/2020   Vitamin D deficiency 05/25/2020   NAFLD (nonalcoholic  fatty liver disease) 05/25/2020   Sciatica of left side 05/25/2020    ONSET DATE: 11/07/21   REFERRING DIAG: cerebellar tumor, s/p craniotomy   THERAPY DIAG:  Other lack of coordination  Other abnormalities of gait and mobility  Unsteadiness on feet  Muscle weakness (generalized)  Attention and concentration deficit  Other disturbances of skin sensation  Frontal lobe and executive function deficit   PERTINENT HISTORY: Virginia Luna is a 61 year old female who recently presented to hospitalized at [facility] complaining of headaches and tremors. 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 She was admitted 11/07/2021, underwent left suboccipital craniotomy, resection of cerebellar tumor. Postoperative MRI performed on 3/22 revealed a small area of diffusion restriction in the left cerebellum Pt was transferred to  [facility] 11/15/21 and  she was d/c home 11/24/21.PMH: arthritis, asthma, chronic pain, DM, fibromyalgia, myofascial pain syndrome, HTN, IBS, sleep apnea Brain   PRECAUTIONS: Fall, no driving  SUBJECTIVE: "I was supposed to bring my putty and I forgot cause I had to bring my epipen"  PAIN:  Are you having pain? Yes: NPRS scale: 8/10 Pain location: head and neck, and legs (all over, generalized) Pain description: constant Aggravating factors: surgery, fibromyalgia, myofascial pain syndrome Relieving factors: meds    OBJECTIVE:   TODAY'S TREATMENT:   01/16/22 Pt stood for 15 mins to perform the following:  Placing washers  Resistance Clothespins 1-8# with RUE for mid and high functional reaching and sustained pinch alternating hands  Placing large pegs into  vertical pegboard with right and left hands, supervision for safety.  Pt reports cooking fish and broccoli in air fryer mod I.  Arm bike x 8 mins level 1 for conditioning. Gripper set at level 2 to pick up 1 inch blocks with left and  right UE's, min difficulty    HOME EXERCISE PROGRAM: putty HEP, coordination HEP, sh flex supine (12/06/21); BUE HEP (12/14/21)   GOALS:     SHORT TERM GOALS: Target date: 12/26/2021   I with HEP  Goal status: achieved   2.  Pt will perform dressing mod I in a reasonable amount of time Baseline: supervision, increased time required Goal status: GOAL MET pt reports completing dressing with supervision, 12/27/21   3.  Pt will perform basic home management with min A. Baseline: dependent Goal status: GOAL MET reports doing ironing 12/27/21   4.  Pt will perform simple cooking task with min a demonstrating good safety awareness. Baseline: dependent Goal status:  achieved, pt used air fryer at home   5.  Pt will increase bilateral grip strength by 5 lbs for increased ease opening containers. Baseline RUE 21.1 lbs, LUE 14.9 lbs :  Goal status: GOAL MET RUE: 35.4   LUE 37.4   6.  Pt will demonstrate ability  to stand for functional/ ADL tasks x 15 mins without rest break and no LOB. Baseline: poor and inconsistent endurance Goal status:  Achieved- 15.37 on 01/16/22   LONG TERM GOALS: Target date: 02/20/2022   I with updated HEP.   Goal status: ongoing   2.  Pt will perform all basic ADLS mod I Baseline: supervision Goal status: ongoing min A with tub transfers, distant supervision for dressing   3.  Pt will perform basic home management modified I Baseline: dependent Goal status:  ongoing, folding laundry only   4.  Pt will perform basic cooking mod I demonstrating good safety awareness. Baseline: depdnent Goal status: ongoing   5.  Pt will demonstrate improved fine motor coordination for ADLS as evidenced by decreasing 9 hole peg test score by 3 secs, bilaterally. Baseline: R 35.06, L 38.53 Goal status:  ongoing   6.  Pt will demonstrate ability to retrieve a 3 lbs weight from overhead shelf with left and right UE's individually demonstrating good control. Baseline:  decreased bilateral strength and control. Goal status: ongoing   ASSESSMENT:   CLINICAL IMPRESSION:  Pt is progressing towards goals. She demonstrates improved endurance and met her standing goal today.    PERFORMANCE DEFICITS in functional skills including ADLs, IADLs, coordination, dexterity, sensation, ROM, strength, pain, fascial restrictions, flexibility, FMC, GMC, mobility, balance, endurance, decreased knowledge of precautions, decreased knowledge of use of DME, vision, and UE functional use, cognitive skills including attention, energy/drive, learn, memory, problem solving, safety awareness, temperament/personality, thought, and understand, and psychosocial skills including coping strategies, environmental adaptation, and routines and behaviors. Pt can benefit from skilled OT to address these deficits.   IMPAIRMENTS are limiting patient from ADLs, IADLs, rest and sleep, play, leisure, and social participation.    COMORBIDITIES may have co-morbidities  that affects occupational performance. Patient will benefit from skilled OT to address above impairments and improve overall function.   MODIFICATION OR ASSISTANCE TO COMPLETE EVALUATION: No modification of tasks or assist necessary to complete an evaluation.   OT OCCUPATIONAL PROFILE AND HISTORY: Detailed assessment: Review of records and additional review of physical, cognitive, psychosocial history related to current functional performance.   CLINICAL DECISION MAKING: LOW - limited treatment options, no task modification necessary   REHAB POTENTIAL: Good   EVALUATION COMPLEXITY: Low     PLAN: OT FREQUENCY: 2x/week   OT DURATION: 12 weeks plus eval   PLANNED INTERVENTIONS: self care/ADL training, therapeutic exercise, therapeutic activity, neuromuscular re-education, manual therapy, manual lymph drainage, passive range of motion, gait training, balance training, functional mobility training, aquatic therapy, splinting, electrical  stimulation, ultrasound, paraffin, fluidotherapy, moist heat, cryotherapy, contrast bath, patient/family education, cognitive remediation/compensation, visual/perceptual remediation/compensation, energy conservation, coping strategies training, and DME and/or AE instructions   RECOMMENDED OTHER SERVICES: n/a   CONSULTED AND AGREED WITH PLAN OF CARE: Patient   PLAN FOR NEXT SESSION:  walker safety with rollator, cooking task,  standing activities for activity tolerance with reaching     Allayna Erlich, OTR/L               01/16/2022, 8:50 AM Theone Murdoch, OTR/L [phone] Phone: [phone] 9:31 AM 01/16/22

## 2022-01-16 NOTE — Therapy (Signed)
OUTPATIENT SPEECH LANGUAGE PATHOLOGY TREATMENT NOTE   Patient Name: Virginia Luna MRN: [id number] DOB:23-May-1961, 61 y.o., female Today's Date: 01/16/2022  PCP: Shirline Frees, MD REFERRING PROVIDER: Barbie Banner, PA-C   END OF SESSION:   End of Session - 01/16/22 0754     Visit Number 11    Number of Visits 25    Date for SLP Re-Evaluation 02/20/22    Authorization Type [employer]    Progress Note Due on Visit 10    SLP Start Time 0801    SLP Stop Time  0845    SLP Time Calculation (min) 44 min    Activity Tolerance Patient tolerated treatment well                  Past Medical History:  Diagnosis Date   Allergies    Anemia    Arthritis    Asthma    Back pain    Chronic pain    Complication of anesthesia    woke up during colonoscopy   Diabetes ([place])    Diabetes mellitus without complication (HCC)    Edema, lower extremity    Fibroid    Fibromyalgia    Chronic   GERD (gastroesophageal reflux disease)    Headache    migraines   High blood pressure    History of hiatal hernia    History of stomach ulcers    IBS (irritable bowel syndrome)    Joint pain    Sleep apnea    did use cpap-lost 25lb-says she does not need it   NO CPAP   Wears contact lenses    Past Surgical History:  Procedure Laterality Date   BRAIN SURGERY Left 11/07/2021   [facility] Dr. Herschel [place]   BREAST BIOPSY     BREAST EXCISIONAL BIOPSY     BREAST LUMPECTOMY WITH RADIOACTIVE SEED LOCALIZATION Bilateral 11/24/2014   Procedure: BILATERAL BREAST LUMPECTOMY WITH RADIOACTIVE SEED LOCALIZATION;  Surgeon: [name], MD;  Location: [facility];  Service: General;  Laterality: Bilateral;   CHOLECYSTECTOMY     COLONOSCOPY     DILATION AND CURETTAGE OF UTERUS     ESOPHAGOGASTRODUODENOSCOPY (EGD) WITH PROPOFOL N/A 07/23/2016   Procedure: ESOPHAGOGASTRODUODENOSCOPY (EGD) WITH PROPOFOL;  Surgeon: Laurence Spates, MD;  [facility];  Service: Endoscopy;   Laterality: N/A;   ESOPHAGOGASTRODUODENOSCOPY (EGD) WITH PROPOFOL N/A 06/11/2018   Procedure: ESOPHAGOGASTRODUODENOSCOPY (EGD) WITH PROPOFOL;  Surgeon: Laurence Spates, MD;  [facility];  Service: Endoscopy;  Laterality: N/A;   LUMBAR LAMINECTOMY  2010   ORIF WRIST FRACTURE Right 11/24/2020   Procedure: OPEN REDUCTION INTERNAL FIXATION RIGHT WRIST FRACTURE;  Surgeon: Renette Butters, MD;  [facility];  Service: Orthopedics;  Laterality: Right;   UMBILICAL HERNIA REPAIR     age 54   UPPER GI ENDOSCOPY     Patient Active Problem List   Diagnosis Date Noted   Cerebellar tumor ([place]) 11/15/2021   Comorbid sleep-related hypoventilation 10/19/2021   Urinary incontinence/areflexic bladder 10/05/2021   OSA, unable to tolerate CPAP (vomited every night) 09/05/2021   Chronic pain syndrome 09/05/2021   Chronic nausea 09/05/2021   Neurogenic bladder, followed by Urology, instructed I&O cath BID 09/05/2021   Intolerance of continuous positive airway pressure (CPAP) ventilation 09/03/2021   Insomnia secondary to chronic pain 07/24/2021   Primary osteoarthritis of right knee 07/03/2021   Myofascial pain dysfunction syndrome 07/03/2021   Fibromyalgia 07/03/2021   Hyponatremia 07/03/2021   Primary osteoarthritis of left knee  07/03/2021   Body mass index (BMI) 50.0-59.9, adult ([place]) 03/07/2021   Allergic rhinitis due to animal (cat) (dog) hair and dander 10/06/2020   Allergic rhinitis due to pollen 10/06/2020   Moderate persistent asthma, uncomplicated 39/76/7341   Cerebellar mass 10/06/2020   Paresthesia 05/27/2020   Diabetes mellitus ([place]) 05/25/2020   Hypertension associated with diabetes ([place]) 05/25/2020   Hyperlipidemia associated with type 2 diabetes mellitus ([place]) 05/25/2020   Vitamin D deficiency 05/25/2020   NAFLD (nonalcoholic fatty liver disease) 05/25/2020   Sciatica of left side 05/25/2020    ONSET DATE: March 2023 (surgery 11/07/2021)  REFERRING DIAG: D49.6  (ICD-10-CM) - Cerebellar tumor ([place])   THERAPY DIAG:  Cognitive communication deficit  Dysarthria and anarthria  Aphasia  SUBJECTIVE: "  PAIN:  Are you having pain? Yes NPRS scale: 8/10 Pain location: "everywhere"  OBJECTIVE:   TODAY'S TREATMENT: SKILLED INTERVENTIONS:  01-16-22: Targeted use of written memory compensations. Practiced use of generating grocery list for pt ID meals she may eat at home. Min-A for completeness of list and extended time for processing to aid in successful completion of task. Target sequencing and verbal communication through [place] how to make meal pt would like SO to make. Usual mod-A for details such as how much of something to add. Discussed pt's upcoming d/c from [place], pt feels satisfied with current functional status and is prepared for d/c in upcoming visit.   01-11-22: Target compensations pt can implement to resume reading. Suggest using audio books, magnifying glass, ruler to guide the eyes. Education on compensations and strategies to use in conversational speech to clearly communicate thoughts and ideas. During unstructured conversation, usual mod-A to utilize strategies for coherent, complete thoughts. Completed defining homographs activity. 75% accuracy, requires initial mod-A, faded to mod-I for accurate answers.   01-09-22: Discussion on pt's involvement in household activities. Reports trying to increase activities, including paying bills. Reports some frustration with task. Education on implementation of stop-think-do to mitigate barriers and prepare for successful completion of task. Practiced using personally relevant tasks. Occasional feedback on implementation of compensations and external aids to enhance success. IND for sequencing steps. Led pt through synonym task as anomia strategy. 60% accuracy, usual mod-A required.    PATIENT EDUCATION: Education details: see above Person educated: Patient Education method: Explanation,  Demonstration, and Handouts Education comprehension: verbalized understanding, returned demonstration, and needs further education     GOALS: Goals reviewed with patient? Yes   SHORT TERM GOALS: Target date: 12/27/2021   Pt will complete standardized cognitive assessment to better appreciate reported cognitive deficits within first 2 sessions.  Baseline: 12-06-21, 12-12-21 Goal status: goal met   2.  Pt will verbalize and implement 2 attention and memory compensations to aid daily functioning given occasional min A over 2 sessions Baseline: 12-19-21; 12-26-21 Goal status: goal met   3.  Pt will demonstrate successful usage of anomia compensations, with occasional min-A, to make 5 minute mod complex conversation functional over 2 sessions Baseline: 12-21-21, 12-26-21 Goal status: goal met   4.  Pt will demonstrate dysarthria compensations on structured speech tasks with 80% accuracy given occasional min A over 2 sessions Baseline: 12-21-21, 12-26-21 Goal status: goal met     LONG TERM GOALS: Target date: 02/21/2022   Pt will verbalize x3 functional areas in which implementation of attention/memory compensations/strategies have resulted in increased successful participation in desired activities.  Baseline:  Goal status: ongoing   2.  Pt will report completion of dysarthria HEP at least 1x/day (  rec BID) over 1 week period with rare min-A.  Baseline:  Goal status: ongoing   3.  Pt will report improvement on CPIB by 3 points by d/c.  Baseline:  Goal status: ongoing   4.  Pt will utilize anomia compensations when word finding occurs in 20+ minute conversation with rare min A Baseline:  Goal status: ongoing     ASSESSMENT:   CLINICAL IMPRESSION: Patient is a 61 y.o. F who was seen today for cognitive communication deficits related to cognition, speech, and language s/p cerebellar tumor resection and CVA. Pt demonstrates significantly impacted processing, requiring extended time to process  visual and auditory information and form appropriate responses in conversation. Auditory processing impairments observed in y/n questions, pt with high accuracy, however requires repetition from ST and to self + extended period to generate response. Anomia demonstrated and frequent use of vague language without awareness. W/o cueing, pt not aware that ST lacks insight into vague references and requires min-A to repair communication breakdowns. Demonstrates memory impairments in simple tasks such as stating months of year, has to repeat to self to come up with "October." Currently residing with God-daughter d/t limited ability to care for self. Currently requires assistance with medication management, managing finances, feeding self, managing schedule. Dysphagia impairments noted from [facility] notes, pt reports to using compensations and is able to teach back those to ST. Pain and fatigue likely impacting pt's overall cognitive function, based on pt report. CLQT administration demonstrates moderate impairment in memory, mild impairment in language, low WNL for domains of attention, ex functions, visuospatial skills. Overall severity rating of mild.    OBJECTIVE IMPAIRMENTS include attention, memory, executive functioning, expressive language, receptive language, dysarthria, and dysphagia. These impairments are limiting patient from managing medications, managing appointments, managing finances, household responsibilities, ADLs/IADLs, effectively communicating at home and in community, and safety when swallowing. No overt factors affecting potential to achieve goals and functional outcomes. Patient will benefit from skilled SLP services to address above impairments and improve overall function.   REHAB POTENTIAL: Good   PLAN: SLP FREQUENCY: 2x/week   SLP DURATION: 12 weeks   PLANNED INTERVENTIONS: Aspiration precaution training, Diet toleration management , Environmental controls, Trials of upgraded  texture/liquids, Cueing hierachy, Cognitive reorganization, Internal/external aids, Functional tasks, Multimodal communication approach, SLP instruction and feedback, Compensatory strategies, and Patient/family education  Su Monks, [place] 01/16/2022, 7:55 AM

## 2022-01-16 NOTE — Patient Instructions (Signed)
Access Code: [email or web address] URL: [email or web address] Date: 01/16/2022 Prepared by: Elease Etienne  Exercises - Sit to Stand Without Arm Support  - 1 x daily - 7 x weekly - 3 sets - 10 reps - Standing March with Counter Support  - 1 x daily - 7 x weekly - 3 sets - 10 reps - Chin Tuck  - 1 x daily - 7 x weekly - 3 sets - 10 reps - Seated Cervical Sidebending Stretch  - 1 x daily - 7 x weekly - 3 sets - 10 reps - Supine Bridge  - 1 x daily - 4 x weekly - 3 sets - 10 reps - Supine Active Straight Leg Raise  - 1 x daily - 4 x weekly - 2 sets - 8 reps - Forward and Backward Monster Walk with Counter Support  - 1 x daily - 4 x weekly - 3 sets - 10 reps - Seated Heel Raise  - 1 x daily - 4 x weekly - 2 sets - 12 reps - Single Leg Stance on Pillow  - 1 x daily - 4 x weekly - 1 sets - 4 reps - Heel Raises with Counter Support  - 1 x daily - 4 x weekly - 2 sets - 8 reps - Sidelying ITB Stretch off Table  - 1 x daily - 4 x weekly - 1 sets - 2 reps - 45 seconds hold

## 2022-01-16 NOTE — Therapy (Addendum)
OUTPATIENT PHYSICAL THERAPY TREATMENT NOTE   Patient Name: Virginia Luna MRN: [id number] DOB:1960-12-13, 61 y.o., female Today's Date: 01/16/2022  PCP: Shirline Frees, MD REFERRING PROVIDER: Courtney Heys, MD     PT progress note for [employer]  Reporting period 11/28/2021 to 01/16/2022  See Note below for Objective Data and Assessment of Progress/Goals  Thank you for the referral of this patient. Elease Etienne, PT, DPT  END OF SESSION:   PT End of Session - 01/16/22 0935     Visit Number 10    Number of Visits 13    Date for PT Re-Evaluation 02/23/22    Authorization Type [employer]    Progress Note Due on Visit 10    PT Start Time 0932    PT Stop Time 1018    PT Time Calculation (min) 46 min    Equipment Utilized During Treatment Gait belt    Activity Tolerance Patient tolerated treatment well    Behavior During Therapy WFL for tasks assessed/performed              Past Medical History:  Diagnosis Date   Allergies    Anemia    Arthritis    Asthma    Back pain    Chronic pain    Complication of anesthesia    woke up during colonoscopy   Diabetes (HCC)    Diabetes mellitus without complication (HCC)    Edema, lower extremity    Fibroid    Fibromyalgia    Chronic   GERD (gastroesophageal reflux disease)    Headache    migraines   High blood pressure    History of hiatal hernia    History of stomach ulcers    IBS (irritable bowel syndrome)    Joint pain    Sleep apnea    did use cpap-lost 25lb-says she does not need it   NO CPAP   Wears contact lenses    Past Surgical History:  Procedure Laterality Date   BRAIN SURGERY Left 11/07/2021   [facility] Dr. Herschel [place]   BREAST BIOPSY     BREAST EXCISIONAL BIOPSY     BREAST LUMPECTOMY WITH RADIOACTIVE SEED LOCALIZATION Bilateral 11/24/2014   Procedure: BILATERAL BREAST LUMPECTOMY WITH RADIOACTIVE SEED LOCALIZATION;  Surgeon: [name], MD;  Location: [place];  Service: General;  Laterality: Bilateral;   CHOLECYSTECTOMY     COLONOSCOPY     DILATION AND CURETTAGE OF UTERUS     ESOPHAGOGASTRODUODENOSCOPY (EGD) WITH PROPOFOL N/A 07/23/2016   Procedure: ESOPHAGOGASTRODUODENOSCOPY (EGD) WITH PROPOFOL;  Surgeon: Laurence Spates, MD;  Location: [facility];  Service: Endoscopy;  Laterality: N/A;   ESOPHAGOGASTRODUODENOSCOPY (EGD) WITH PROPOFOL N/A 06/11/2018   Procedure: ESOPHAGOGASTRODUODENOSCOPY (EGD) WITH PROPOFOL;  Surgeon: Laurence Spates, MD;  Location: [facility];  Service: Endoscopy;  Laterality: N/A;   LUMBAR LAMINECTOMY  2010   ORIF WRIST FRACTURE Right 11/24/2020   Procedure: OPEN REDUCTION INTERNAL FIXATION RIGHT WRIST FRACTURE;  Surgeon: Renette Butters, MD;  Location: [facility];  Service: Orthopedics;  Laterality: Right;   UMBILICAL HERNIA REPAIR     age 83   UPPER GI ENDOSCOPY     Patient Active Problem List   Diagnosis Date Noted   Cerebellar tumor ([place]) 11/15/2021   Comorbid sleep-related hypoventilation 10/19/2021   Urinary incontinence/areflexic bladder 10/05/2021   OSA, unable to tolerate CPAP (vomited every night) 09/05/2021   Chronic pain syndrome 09/05/2021   Chronic nausea 09/05/2021   Neurogenic bladder, followed by Urology, instructed  I&O cath BID 09/05/2021   Intolerance of continuous positive airway pressure (CPAP) ventilation 09/03/2021   Insomnia secondary to chronic pain 07/24/2021   Primary osteoarthritis of right knee 07/03/2021   Myofascial pain dysfunction syndrome 07/03/2021   Fibromyalgia 07/03/2021   Hyponatremia 07/03/2021   Primary osteoarthritis of left knee 07/03/2021   Body mass index (BMI) 50.0-59.9, adult ([place]) 03/07/2021   Allergic rhinitis due to animal (cat) (dog) hair and dander 10/06/2020   Allergic rhinitis due to pollen 10/06/2020   Moderate persistent asthma, uncomplicated 84/16/6063   Cerebellar mass 10/06/2020   Paresthesia 05/27/2020   Diabetes mellitus ([place]) 05/25/2020    Hypertension associated with diabetes ([place]) 05/25/2020   Hyperlipidemia associated with type 2 diabetes mellitus ([place]) 05/25/2020   Vitamin D deficiency 05/25/2020   NAFLD (nonalcoholic fatty liver disease) 05/25/2020   Sciatica of left side 05/25/2020    REFERRING DIAG: D49.6 (ICD-10-CM) - Neoplasm of unspecified behavior of brain    THERAPY DIAG:  Other lack of coordination  Other abnormalities of gait and mobility  Unsteadiness on feet  Muscle weakness (generalized)  PERTINENT HISTORY: From H&P note on 11/15/2021:  "On the day of admission 11/07/2021, the patient was taken to the operating room by Dr. Recardo Evangelist of neurosurgery and underwent left suboccipital craniotomy, resection of cerebellar tumor. She tolerated the procedure well. Postoperative MRI performed on 3/22 revealed a small area of diffusion restriction in the left cerebellum."  Patient transferred to [place] on 11/15/2021, discharged 11/24/2021 per pt.  fibromyalgia, DM2, asthma, HTN, OSA, migraines, bilateral knee OA, myofascial pain syndrome    PRECAUTIONS: Fall  SUBJECTIVE: No new falls.  Nothing has really changed at home, still having help from boyfriend.  PAIN:  She endorses same pain from prior session. Are you having pain? Yes: NPRS scale: 8/10 Pain location: All over (fibromyalgia), worse in head and neck and BLE (LLE worse)  - she feels a cutting-like sensation in the left posterior part of her head not quite over the incision (incision intact and pink)  Head is 8.5/10 and body is 8/10. OBJECTIVE:    DIAGNOSTIC FINDINGS: From Brain MRI on 09/02/2021:  "Increased size of left superior cerebellar vermis mass, now measuring 1.5 x 1.5 cm, previously 1.1 x 1.0 cm. Increased mild surrounding edema."  Future brain MRI ordered w/o date visible at this time.   COGNITION: Overall cognitive status: No family/caregiver present to determine baseline cognitive functioning - pt states "I feel stupid", she elaborates that she  is having a hard time processing.     TODAY'S TREATMENT:  THEREX: -NuStep L5 x8 minutes BUEs/BLEs for reciprocal movement, dynamic warmup and LE strengthening. -PT provides PROM to right hip in supine and side-lying w/ ER stretch and side-lying TFL stretch w/ addition to HEP. -modified monster walks to add red theraband 6x8' at [place] w/ unilateral UE support  NMR: -Standing EOM performing cone taps continuously x10 in semi-circle, pt dizzy upon arising to standing stating this is a common issue when reaching to floor at home.  Dizziness resolves <30sec each bout.  Performed cone taps x10 again with rise to stand between each cone tap to promote pacing of activity and breathing as holding breath was noted issue in previous bout. -side-stepping at [facility] w/ reach to floor to retreive dumbbells (1-3#) x5 CGA-supervision  PATIENT EDUCATION: Education details: Additions to HEP.  Use seated stepper (pt states she has one that she would like to use) x8-10 minutes in chair with back support to maintain posture,  progress time as able by increments of 1-2 minutes a week.   Person educated: Patient Education method: Explanation  Education comprehension: verbalized understanding and needs further education     HOME EXERCISE PROGRAM: Access Code: [email or web address] URL: [email or web address] Date: 01/16/2022 Prepared by: Elease Etienne  Exercises - Sit to Stand Without Arm Support  - 1 x daily - 7 x weekly - 3 sets - 10 reps - Standing March with Counter Support  - 1 x daily - 7 x weekly - 3 sets - 10 reps - Chin Tuck  - 1 x daily - 7 x weekly - 3 sets - 10 reps - Seated Cervical Sidebending Stretch  - 1 x daily - 7 x weekly - 3 sets - 10 reps - Supine Bridge  - 1 x daily - 4 x weekly - 3 sets - 10 reps - Supine Active Straight Leg Raise  - 1 x daily - 4 x weekly - 2 sets - 8 reps - Forward and Backward Monster Walk with Counter Support  - 1 x daily - 4 x weekly - 3 sets - 10 reps -  Seated Heel Raise  - 1 x daily - 4 x weekly - 2 sets - 12 reps - Single Leg Stance on Pillow  - 1 x daily - 4 x weekly - 1 sets - 4 reps - Heel Raises with Counter Support  - 1 x daily - 4 x weekly - 2 sets - 8 reps - Sidelying ITB Stretch off Table  - 1 x daily - 4 x weekly - 1 sets - 2 reps - 45 seconds hold  GOALS: Goals reviewed with patient? Yes   SHORT TERM GOALS: Target date:  01/12/2022   Pt will be independent with initial strength and balance HEP with supervision from family. Baseline:  Established 12/06/2021, pt compliant intermittently Goal status: PARTIALLY MET   2.  Pt will decrease 5xSTS to <35 seconds w/ BUE support in order to demonstrate decreased risk for falls and improved functional bilateral LE strength and power. Baseline: 38.51sec w/ BUE; 21.94 sec w/ BUE support Goal status: MET   3.  Pt will demonstrate a gait speed of >1 feet/sec in order to decrease risk for falls. Baseline: 0.76 ft/sec;  3.03 ft/sec w/ RW Goal status: MET   4.  Pt will improve normal TUG to less than or equal to 30 seconds w/LRAD for improved functional mobility and decreased fall risk.  Baseline: 34.87s w/RW and S*; 18.91 seconds w/ RW and S* Goal status: MET   5.  Pt will improve Berg score to 41/56 for decreased fall risk  Baseline: 35/56 on 4/19; 38/56 Goal status: PARTIALLY MET   LONG TERM GOALS: Target date:  02/23/2022   Pt will report return to some form of aerobic activity with maintenance of advanced HEP for strength and balance. Baseline: To be established Goal status: INITIAL   2.  Pt will decrease 5xSTS to <20 seconds w/ BUE support in order to demonstrate decreased risk for falls and improved functional bilateral LE strength and power. Baseline: 38.51 sec w/ BUE support; 21.94 seconds 01/09/2022 Goal status: REVISED   3.  Pt will demonstrate a gait speed of >3.3 feet/sec in order to decrease risk for falls. Baseline: 0.76 ft/sec; 3.03 ft/sec w/ RW Goal status:  REVISED   4.  Pt will improve normal TUG to less than or equal to 16 seconds w/LRAD for improved functional mobility and decreased fall risk.  Baseline: 34.87s  w/RW and S*; 18.91s w/ RW and S* Goal status: REVISED   5.  Pt will improve Berg score to 48/56 for decreased fall risk  Baseline: 35/56 on 4/19 Goal status: INITIAL   6.  Pt will tolerate >24mins of continuous activity in order to promote activity tolerance and endurance. Baseline: Pt extremely fatigued during session, nodding off intermittently. Goal status: INITIAL   ASSESSMENT:   CLINICAL IMPRESSION: Pt demonstrates improved static balance this session with repetitions of reach to floor level.  She has some mild dizziness on return to standing requiring cuing for pacing of activity and to prevent holding her breath.  She tolerates modifications to her HEP well today with good understanding of changes made.  Will continue to progress LE strengthening and dynamic balance as able in coming sessions.    OBJECTIVE IMPAIRMENTS Abnormal gait, decreased activity tolerance, decreased balance, decreased cognition, decreased coordination, decreased endurance, decreased knowledge of condition, decreased knowledge of use of DME, decreased mobility, difficulty walking, decreased ROM, decreased strength, and decreased safety awareness.    ACTIVITY LIMITATIONS cleaning, community activity, driving, meal prep, laundry, and shopping.    PERSONAL FACTORS Age, Behavior pattern, Fitness, and 3+ comorbidities: fibromyalgia, DM2, asthma, HTN, OSA, migraines, bilateral knee OA, myofascial pain syndrome  are also affecting patient's functional outcome.      REHAB POTENTIAL: Fair hx of fibromyalgia, DM2, asthma, HTN, OSA, migraines, bilateral knee OA, myofascial pain syndrome , personal factors   CLINICAL DECISION MAKING: Evolving/moderate complexity   EVALUATION COMPLEXITY: Moderate   PLAN: PT FREQUENCY: 1x/week   PT DURATION: 12 weeks    PLANNED INTERVENTIONS: Therapeutic exercises, Therapeutic activity, Neuromuscular re-education, Balance training, Gait training, Patient/Family education, Joint mobilization, Stair training, Vestibular training, DME instructions, Manual therapy, and self care and home management   PLAN FOR NEXT SESSION: Use SciFit for endurance and strengthening w/ BUE/BLE.  Gait w/ rollator vs RW-for safety in home as pt uses in home intermittently.  Add to/revise HEP as needed.  Endurance, BLE strength, gait training w/ RW management, 6" step ups - dec UE support, quad strengthening, work on backwards walking, STS w/ overhead press     Bary Richard, PT, DPT 01/16/2022, 10:45 AM

## 2022-01-18 ENCOUNTER — Ambulatory Visit: Payer: Medicare HMO | Attending: Physician Assistant | Admitting: Speech Pathology

## 2022-01-18 ENCOUNTER — Ambulatory Visit: Payer: Medicare HMO | Admitting: Speech Pathology

## 2022-01-18 ENCOUNTER — Ambulatory Visit: Payer: Medicare HMO | Admitting: Neurology

## 2022-01-18 DIAGNOSIS — R2681 Unsteadiness on feet: Secondary | ICD-10-CM | POA: Diagnosis not present

## 2022-01-18 DIAGNOSIS — R471 Dysarthria and anarthria: Secondary | ICD-10-CM | POA: Diagnosis not present

## 2022-01-18 DIAGNOSIS — J301 Allergic rhinitis due to pollen: Secondary | ICD-10-CM | POA: Diagnosis not present

## 2022-01-18 DIAGNOSIS — M6281 Muscle weakness (generalized): Secondary | ICD-10-CM | POA: Insufficient documentation

## 2022-01-18 DIAGNOSIS — R4701 Aphasia: Secondary | ICD-10-CM | POA: Insufficient documentation

## 2022-01-18 DIAGNOSIS — R41841 Cognitive communication deficit: Secondary | ICD-10-CM | POA: Diagnosis not present

## 2022-01-18 DIAGNOSIS — R208 Other disturbances of skin sensation: Secondary | ICD-10-CM | POA: Diagnosis not present

## 2022-01-18 DIAGNOSIS — R41844 Frontal lobe and executive function deficit: Secondary | ICD-10-CM | POA: Insufficient documentation

## 2022-01-18 DIAGNOSIS — J3089 Other allergic rhinitis: Secondary | ICD-10-CM | POA: Diagnosis not present

## 2022-01-18 DIAGNOSIS — R2689 Other abnormalities of gait and mobility: Secondary | ICD-10-CM | POA: Diagnosis not present

## 2022-01-18 DIAGNOSIS — R278 Other lack of coordination: Secondary | ICD-10-CM | POA: Diagnosis not present

## 2022-01-18 DIAGNOSIS — R41840 Attention and concentration deficit: Secondary | ICD-10-CM | POA: Diagnosis not present

## 2022-01-18 DIAGNOSIS — J3081 Allergic rhinitis due to animal (cat) (dog) hair and dander: Secondary | ICD-10-CM | POA: Diagnosis not present

## 2022-01-18 NOTE — Therapy (Signed)
OUTPATIENT SPEECH LANGUAGE PATHOLOGY TREATMENT NOTE   Patient Name: Virginia Luna MRN: [id number] DOB:01/20/1961, 60 y.o., female Today's Date: 01/18/2022  PCP: Harris, William, MD REFERRING PROVIDER: Setzer, Sandra J, PA-C   END OF SESSION:   End of Session - 01/18/22 0800     Visit Number 12    Number of Visits 25    Date for SLP Re-Evaluation 02/20/22    Authorization Type [employer]    Progress Note Due on Visit 10    SLP Start Time 0800    SLP Stop Time  0845    SLP Time Calculation (min) 45 min    Activity Tolerance Patient tolerated treatment well                  Past Medical History:  Diagnosis Date   Allergies    Anemia    Arthritis    Asthma    Back pain    Chronic pain    Complication of anesthesia    woke up during colonoscopy   Diabetes (HCC)    Diabetes mellitus without complication (HCC)    Edema, lower extremity    Fibroid    Fibromyalgia    Chronic   GERD (gastroesophageal reflux disease)    Headache    migraines   High blood pressure    History of hiatal hernia    History of stomach ulcers    IBS (irritable bowel syndrome)    Joint pain    Sleep apnea    did use cpap-lost 25lb-says she does not need it   NO CPAP   Wears contact lenses    Past Surgical History:  Procedure Laterality Date   BRAIN SURGERY Left 11/07/2021   [facility] Dr. As[facility]   BREAST BIOPSY     BREAST EXCISIONAL BIOPSY     BREAST LUMPECTOMY WITH RADIOACTIVE SEED LOCALIZATION Bilateral 11/24/2014   Procedure: BILATERAL BREAST LUMPECTOMY WITH RADIOACTIVE SEED LOCALIZATION;  Surgeon:  Cornett, MD;  Location: [facility];  Service: General;  Laterality: Bilateral;   CHOLECYSTECTOMY     COLONOSCOPY     DILATION AND CURETTAGE OF UTERUS     ESOPHAGOGASTRODUODENOSCOPY (EGD) WITH PROPOFOL N/A 07/23/2016   Procedure: ESOPHAGOGASTRODUODENOSCOPY (EGD) WITH PROPOFOL;  Surgeon: James Edwards, MD;  Location: [facility];  Service: Endoscopy;   Laterality: N/A;   ESOPHAGOGASTRODUODENOSCOPY (EGD) WITH PROPOFOL N/A 06/11/2018   Procedure: ESOPHAGOGASTRODUODENOSCOPY (EGD) WITH PROPOFOL;  Surgeon: Edwards, James, MD;  Location: [facility];  Service: Endoscopy;  Laterality: N/A;   LUMBAR LAMINECTOMY  2010   ORIF WRIST FRACTURE Right 11/24/2020   Procedure: OPEN REDUCTION INTERNAL FIXATION RIGHT WRIST FRACTURE;  Surgeon: Murphy, Timothy D, MD;  Location: [facility];  Service: Orthopedics;  Laterality: Right;   UMBILICAL HERNIA REPAIR     age 8   UPPER GI ENDOSCOPY     Patient Active Problem List   Diagnosis Date Noted   Cerebellar tumor (HCC) 11/15/2021   Comorbid sleep-related hypoventilation 10/19/2021   Urinary incontinence/areflexic bladder 10/05/2021   OSA, unable to tolerate CPAP (vomited every night) 09/05/2021   Chronic pain syndrome 09/05/2021   Chronic nausea 09/05/2021   Neurogenic bladder, followed by Urology, instructed I&O cath BID 09/05/2021   Intolerance of continuous positive airway pressure (CPAP) ventilation 09/03/2021   Insomnia secondary to chronic pain 07/24/2021   Primary osteoarthritis of right knee 07/03/2021   Myofascial pain dysfunction syndrome 07/03/2021   Fibromyalgia 07/03/2021   Hyponatremia 07/03/2021   Primary osteoarthritis of left knee   07/03/2021   Body mass index (BMI) 50.0-59.9, adult (HCC) 03/07/2021   Allergic rhinitis due to animal (cat) (dog) hair and dander 10/06/2020   Allergic rhinitis due to pollen 10/06/2020   Moderate persistent asthma, uncomplicated 10/06/2020   Cerebellar mass 10/06/2020   Paresthesia 05/27/2020   Diabetes mellitus (HCC) 05/25/2020   Hypertension associated with diabetes (HCC) 05/25/2020   Hyperlipidemia associated with type 2 diabetes mellitus (HCC) 05/25/2020   Vitamin D deficiency 05/25/2020   NAFLD (nonalcoholic fatty liver disease) 05/25/2020   Sciatica of left side 05/25/2020    ONSET DATE: March 2023 (surgery 11/07/2021)  REFERRING DIAG: D49.6  (ICD-10-CM) - Cerebellar tumor (HCC)   THERAPY DIAG:  Dysarthria and anarthria  Cognitive communication deficit  Aphasia  SUBJECTIVE: Pt c/o SO being upset she isn't remembering personal event that was upsetting to him   PAIN:  Are you having pain? Yes NPRS scale: 8.5/10 Pain location: "mostly my head"   OBJECTIVE:   TODAY'S TREATMENT: SKILLED INTERVENTIONS:  01-18-22: Pt presenting with emotionality 2/2 expressed struggle to deal with progress of her recovery, relationship stress, and change in abilities to do things for herself. Acknowledged pt's feelings, ensuring feeling overwhelmed is normal and that she is dealing with a lot. Highly encouraged pt to seek help in managing stress and depression through a mental health professional. Pt to make appointment with her therapist. Provide education on how stress and depression can impact overall cognitive functioning to include memory, attention, and executive functioning. Pt does not feel prepared to d/c at this time. Will continue to target dysarthria for improved communication and cognitive deficits to improve functioning in iADLs and communication effectiveness.   01-16-22: Targeted use of written memory compensations. Practiced use of generating grocery list for pt ID meals she may eat at home. Min-A for completeness of list and extended time for processing to aid in successful completion of task. Target sequencing and verbal communication through sequencing how to make meal pt would like SO to make. Usual mod-A for details such as how much of something to add. Discussed pt's upcoming d/c from ST, pt feels satisfied with current functional status and is prepared for d/c in upcoming visit.   01-11-22: Target compensations pt can implement to resume reading. Suggest using audio books, magnifying glass, ruler to guide the eyes. Education on compensations and strategies to use in conversational speech to clearly communicate thoughts and ideas.  During unstructured conversation, usual mod-A to utilize strategies for coherent, complete thoughts. Completed defining homographs activity. 75% accuracy, requires initial mod-A, faded to mod-I for accurate answers.   01-09-22: Discussion on pt's involvement in household activities. Reports trying to increase activities, including paying bills. Reports some frustration with task. Education on implementation of stop-think-do to mitigate barriers and prepare for successful completion of task. Practiced using personally relevant tasks. Occasional feedback on implementation of compensations and external aids to enhance success. IND for sequencing steps. Led pt through synonym task as anomia strategy. 60% accuracy, usual mod-A required.    PATIENT EDUCATION: Education details: see above Person educated: Patient Education method: Explanation, Demonstration, and Handouts Education comprehension: verbalized understanding, returned demonstration, and needs further education     GOALS: Goals reviewed with patient? Yes   SHORT TERM GOALS: Target date: 12/27/2021   Pt will complete standardized cognitive assessment to better appreciate reported cognitive deficits within first 2 sessions.  Baseline: 12-06-21, 12-12-21 Goal status: goal met   2.  Pt will verbalize and implement 2 attention and memory compensations to   aid daily functioning given occasional min A over 2 sessions Baseline: 12-19-21; 12-26-21 Goal status: goal met   3.  Pt will demonstrate successful usage of anomia compensations, with occasional min-A, to make 5 minute mod complex conversation functional over 2 sessions Baseline: 12-21-21, 12-26-21 Goal status: goal met   4.  Pt will demonstrate dysarthria compensations on structured speech tasks with 80% accuracy given occasional min A over 2 sessions Baseline: 12-21-21, 12-26-21 Goal status: goal met     LONG TERM GOALS: Target date: 02/21/2022   Pt will verbalize x3 functional areas in which  implementation of attention/memory compensations/strategies have resulted in increased successful participation in desired activities.  Baseline:  Goal status: ongoing   2.  Pt will report completion of dysarthria HEP at least 1x/day (rec BID) over 1 week period with rare min-A.  Baseline:  Goal status: ongoing   3.  Pt will report improvement on CPIB by 3 points by d/c.  Baseline:  Goal status: ongoing   4.  Pt will utilize anomia compensations when word finding occurs in 20+ minute conversation with rare min A Baseline:  Goal status: ongoing     ASSESSMENT:   CLINICAL IMPRESSION: Patient is a 60 y.o. F who was seen today for cognitive communication deficits related to cognition, speech, and language s/p cerebellar tumor resection and CVA. Pt demonstrates significantly impacted processing, requiring extended time to process visual and auditory information and form appropriate responses in conversation. Auditory processing impairments observed in y/n questions, pt with high accuracy, however requires repetition from ST and to self + extended period to generate response. Anomia demonstrated and frequent use of vague language without awareness. W/o cueing, pt not aware that ST lacks insight into vague references and requires min-A to repair communication breakdowns. Demonstrates memory impairments in simple tasks such as stating months of year, has to repeat to self to come up with "October." Currently residing with God-daughter d/t limited ability to care for self. Currently requires assistance with medication management, managing finances, feeding self, managing schedule. Dysphagia impairments noted from CIR notes, pt reports to using compensations and is able to teach back those to ST. Pain and fatigue likely impacting pt's overall cognitive function, based on pt report. CLQT administration demonstrates moderate impairment in memory, mild impairment in language, low WNL for domains of attention,  ex functions, visuospatial skills. Overall severity rating of mild.    OBJECTIVE IMPAIRMENTS include attention, memory, executive functioning, expressive language, receptive language, dysarthria, and dysphagia. These impairments are limiting patient from managing medications, managing appointments, managing finances, household responsibilities, ADLs/IADLs, effectively communicating at home and in community, and safety when swallowing. No overt factors affecting potential to achieve goals and functional outcomes. Patient will benefit from skilled SLP services to address above impairments and improve overall function.   REHAB POTENTIAL: Good   PLAN: SLP FREQUENCY: 2x/week   SLP DURATION: 12 weeks   PLANNED INTERVENTIONS: Aspiration precaution training, Diet toleration management , Environmental controls, Trials of upgraded texture/liquids, Cueing hierachy, Cognitive reorganization, Internal/external aids, Functional tasks, Multimodal communication approach, SLP instruction and feedback, Compensatory strategies, and Patient/family education   L , CCC-SLP 01/18/2022, 8:01 AM   

## 2022-01-23 ENCOUNTER — Encounter: Payer: Medicare HMO | Admitting: Occupational Therapy

## 2022-01-23 ENCOUNTER — Ambulatory Visit: Payer: Medicare HMO | Admitting: Physical Therapy

## 2022-01-23 ENCOUNTER — Encounter: Payer: Self-pay | Admitting: Speech Pathology

## 2022-01-23 DIAGNOSIS — J3081 Allergic rhinitis due to animal (cat) (dog) hair and dander: Secondary | ICD-10-CM | POA: Diagnosis not present

## 2022-01-23 DIAGNOSIS — J3089 Other allergic rhinitis: Secondary | ICD-10-CM | POA: Diagnosis not present

## 2022-01-23 DIAGNOSIS — J301 Allergic rhinitis due to pollen: Secondary | ICD-10-CM | POA: Diagnosis not present

## 2022-01-24 ENCOUNTER — Encounter: Payer: Medicare HMO | Attending: Physical Medicine and Rehabilitation | Admitting: Physical Medicine and Rehabilitation

## 2022-01-24 ENCOUNTER — Encounter: Payer: Self-pay | Admitting: Physical Medicine and Rehabilitation

## 2022-01-24 VITALS — BP 129/83 | HR 101 | Ht 62.0 in | Wt 278.0 lb

## 2022-01-24 DIAGNOSIS — L905 Scar conditions and fibrosis of skin: Secondary | ICD-10-CM | POA: Diagnosis not present

## 2022-01-24 DIAGNOSIS — R55 Syncope and collapse: Secondary | ICD-10-CM

## 2022-01-24 DIAGNOSIS — M792 Neuralgia and neuritis, unspecified: Secondary | ICD-10-CM

## 2022-01-24 DIAGNOSIS — G43909 Migraine, unspecified, not intractable, without status migrainosus: Secondary | ICD-10-CM | POA: Diagnosis not present

## 2022-01-24 DIAGNOSIS — E119 Type 2 diabetes mellitus without complications: Secondary | ICD-10-CM | POA: Diagnosis not present

## 2022-01-24 DIAGNOSIS — M7918 Myalgia, other site: Secondary | ICD-10-CM

## 2022-01-24 DIAGNOSIS — D496 Neoplasm of unspecified behavior of brain: Secondary | ICD-10-CM | POA: Diagnosis not present

## 2022-01-24 DIAGNOSIS — M797 Fibromyalgia: Secondary | ICD-10-CM | POA: Diagnosis not present

## 2022-01-24 DIAGNOSIS — G894 Chronic pain syndrome: Secondary | ICD-10-CM

## 2022-01-24 NOTE — Progress Notes (Signed)
Pt is a 61 yr old female with hx of fibromyalgia- dx'd at age 20, DM2,- Ac1 6.2;  asthma; HTN, OSA, BMI is 50; Nodule in L superior cerebellum. Also has migraines- intractable and daily; has trace aura;  No kidney issues- Cr 0.65 and BUN 15 in 6/22. Also end stage OA of B/L knees medially and R hip pain. Also has myofascial pain syndrome.  Has new brain mass that was documented Worsening cerebellar Sx's. So had crani for cerebellar tumor removal late March 2023-     Changing therapy to 1x/week starting this week- for PT, OT and SLP.   Got a call from Surgeon/PA- never really seen them in person- only PA.  Did a f/u with PA- discharged from [facility] at this time.   Migraines- not that bad lately Mainly feels like was cut where had surgery-  Doesn't feel healed, even though it is.    Wants trigger point injections today.  Hurting all in upper neck/back and shoulders again.   Finally saw the scar yesterday- put hair in ponytail-  Upset her that saw scar.   Neurologist gave her some medicine for "shaking" Cannot see books- so going to Eye doctor today.   [place] out again- Thursday night- passed out-  ddidn't check BP after woke up. - has BP machine.  Gets real hot, and then Falls out-  3x since was in hospital.   Told Dr Juleen China- Wellness doctor- transferred to [facility].- not with [facility] anymore.   Is using CPAP more.   Plan: Patient here for trigger point injections for  Consent done and on chart.  Cleaned areas with alcohol and injected using a 27 gauge 1.5 inch needle  Injected 6cc Using 1% Lidocaine with no EPI  Upper traps B/L  L x2 Levators- B/L  Posterior scalenes Middle scalenes- B/ L- away from incision on L posterior auricular area Splenius Capitus- B/L  Pectoralis Major- B/ L Rhomboids B/L x2 Infraspinatus Teres Major/minor Thoracic paraspinals Lumbar paraspinals Other injections-    Patient's level of pain prior was  8.5/10 Current level of pain after injections  is- 6/10  There was no bleeding or complications.  Patient was advised to drink a lot of water on day after injections to flush system Will have increased soreness for 12-48 hours after injections.  Can use Lidocaine patches the day AFTER injections Can use theracane on day of injections in places didn't inject Can use heating pad 4-6 hours AFTER injections    2. Can use Mederma for scar healing- it's over the counter at drug store. Since upset about scar behind L ear from Crani   3. Migraines meds through Dr Tomi Likens.   4. Con't Trileptal 900 mg QHS for nerve pain- doesn't need refills.    5. For "falling out"- need to call PCP or Neurology- needs to have work up for syncope- said she's had a work up- and was told it was ok, but still passing out. BP 129/83 and pulse 101 today. .   6. F/U in 2 months- for TrP injections

## 2022-01-24 NOTE — Patient Instructions (Signed)
Plan: Patient here for trigger point injections for  Consent done and on chart.  Cleaned areas with alcohol and injected using a 27 gauge 1.5 inch needle  Injected 6cc Using 1% Lidocaine with no EPI  Upper traps B/L  L x2 Levators- B/L  Posterior scalenes Middle scalenes- B/ L- away from incision on L posterior auricular area Splenius Capitus- B/L  Pectoralis Major- B/ L Rhomboids B/L x2 Infraspinatus Teres Major/minor Thoracic paraspinals Lumbar paraspinals Other injections-    Patient's level of pain prior was  8.5/10 Current level of pain after injections is- 6/10  There was no bleeding or complications.  Patient was advised to drink a lot of water on day after injections to flush system Will have increased soreness for 12-48 hours after injections.  Can use Lidocaine patches the day AFTER injections Can use theracane on day of injections in places didn't inject Can use heating pad 4-6 hours AFTER injections    2. Can use Mederma for scar healing- it's over the counter at drug store. Since upset about scar behind L ear from Crani   3. Migraines meds through Dr Tomi Likens.   4. Con't Trileptal 900 mg QHS for nerve pain- doesn't need refills.    5. For "falling out"- need to call PCP or Neurology- needs to have work up for syncope- said she's had a work up- and was told it was ok, but still passing out. BP 129/83 and pulse 101 today. .   6. F/U in 2 months- for TrP injections

## 2022-01-25 DIAGNOSIS — J4540 Moderate persistent asthma, uncomplicated: Secondary | ICD-10-CM | POA: Diagnosis not present

## 2022-01-25 DIAGNOSIS — J3089 Other allergic rhinitis: Secondary | ICD-10-CM | POA: Diagnosis not present

## 2022-01-25 DIAGNOSIS — J301 Allergic rhinitis due to pollen: Secondary | ICD-10-CM | POA: Diagnosis not present

## 2022-01-25 DIAGNOSIS — J3081 Allergic rhinitis due to animal (cat) (dog) hair and dander: Secondary | ICD-10-CM | POA: Diagnosis not present

## 2022-01-29 DIAGNOSIS — D3000 Benign neoplasm of unspecified kidney: Secondary | ICD-10-CM | POA: Diagnosis not present

## 2022-01-29 DIAGNOSIS — R3914 Feeling of incomplete bladder emptying: Secondary | ICD-10-CM | POA: Diagnosis not present

## 2022-01-29 DIAGNOSIS — D3002 Benign neoplasm of left kidney: Secondary | ICD-10-CM | POA: Diagnosis not present

## 2022-01-30 ENCOUNTER — Ambulatory Visit: Payer: Medicare HMO | Admitting: Physical Therapy

## 2022-01-30 ENCOUNTER — Encounter: Payer: Self-pay | Admitting: Physical Therapy

## 2022-01-30 DIAGNOSIS — R2689 Other abnormalities of gait and mobility: Secondary | ICD-10-CM | POA: Diagnosis not present

## 2022-01-30 DIAGNOSIS — R2681 Unsteadiness on feet: Secondary | ICD-10-CM

## 2022-01-30 DIAGNOSIS — R278 Other lack of coordination: Secondary | ICD-10-CM

## 2022-01-30 DIAGNOSIS — R41841 Cognitive communication deficit: Secondary | ICD-10-CM | POA: Diagnosis not present

## 2022-01-30 DIAGNOSIS — R208 Other disturbances of skin sensation: Secondary | ICD-10-CM | POA: Diagnosis not present

## 2022-01-30 DIAGNOSIS — M6281 Muscle weakness (generalized): Secondary | ICD-10-CM

## 2022-01-30 DIAGNOSIS — R4701 Aphasia: Secondary | ICD-10-CM | POA: Diagnosis not present

## 2022-01-30 DIAGNOSIS — R471 Dysarthria and anarthria: Secondary | ICD-10-CM | POA: Diagnosis not present

## 2022-01-30 DIAGNOSIS — R41840 Attention and concentration deficit: Secondary | ICD-10-CM | POA: Diagnosis not present

## 2022-01-30 NOTE — Therapy (Signed)
OUTPATIENT PHYSICAL THERAPY TREATMENT NOTE   Patient Name: Virginia Luna MRN: [id number] DOB:1960-12-03, 61 y.o., female Today's Date: 01/30/2022  PCP: Shirline Frees, MD REFERRING PROVIDER: Courtney Heys, MD  END OF SESSION:   PT End of Session - 01/30/22 0849     Visit Number 11    Number of Visits 13    Date for PT Re-Evaluation 02/23/22    Authorization Type [employer]    Progress Note Due on Visit 10    PT Start Time 0847    PT Stop Time 0927    PT Time Calculation (min) 40 min    Equipment Utilized During Treatment Gait belt    Activity Tolerance Patient tolerated treatment well    Behavior During Therapy WFL for tasks assessed/performed              Past Medical History:  Diagnosis Date   Allergies    Anemia    Arthritis    Asthma    Back pain    Chronic pain    Complication of anesthesia    woke up during colonoscopy   Diabetes ([place])    Diabetes mellitus without complication (HCC)    Edema, lower extremity    Fibroid    Fibromyalgia    Chronic   GERD (gastroesophageal reflux disease)    Headache    migraines   High blood pressure    History of hiatal hernia    History of stomach ulcers    IBS (irritable bowel syndrome)    Joint pain    Sleep apnea    did use cpap-lost 25lb-says she does not need it   NO CPAP   Wears contact lenses    Past Surgical History:  Procedure Laterality Date   BRAIN SURGERY Left 11/07/2021   [facility] Dr. Herschel [place]   BREAST BIOPSY     BREAST EXCISIONAL BIOPSY     BREAST LUMPECTOMY WITH RADIOACTIVE SEED LOCALIZATION Bilateral 11/24/2014   Procedure: BILATERAL BREAST LUMPECTOMY WITH RADIOACTIVE SEED LOCALIZATION;  Surgeon: [name], MD;  Location: [place];  Service: General;  Laterality: Bilateral;   CHOLECYSTECTOMY     COLONOSCOPY     DILATION AND CURETTAGE OF UTERUS     ESOPHAGOGASTRODUODENOSCOPY (EGD) WITH PROPOFOL N/A 07/23/2016   Procedure: ESOPHAGOGASTRODUODENOSCOPY (EGD)  WITH PROPOFOL;  Surgeon: Laurence Spates, MD;  Location: [facility];  Service: Endoscopy;  Laterality: N/A;   ESOPHAGOGASTRODUODENOSCOPY (EGD) WITH PROPOFOL N/A 06/11/2018   Procedure: ESOPHAGOGASTRODUODENOSCOPY (EGD) WITH PROPOFOL;  Surgeon: Laurence Spates, MD;  Location: [facility];  Service: Endoscopy;  Laterality: N/A;   LUMBAR LAMINECTOMY  2010   ORIF WRIST FRACTURE Right 11/24/2020   Procedure: OPEN REDUCTION INTERNAL FIXATION RIGHT WRIST FRACTURE;  Surgeon: Renette Butters, MD;  Location: [facility];  Service: Orthopedics;  Laterality: Right;   UMBILICAL HERNIA REPAIR     age 41   UPPER GI ENDOSCOPY     Patient Active Problem List   Diagnosis Date Noted   Cerebellar tumor ([place]) 11/15/2021   Comorbid sleep-related hypoventilation 10/19/2021   Urinary incontinence/areflexic bladder 10/05/2021   OSA, unable to tolerate CPAP (vomited every night) 09/05/2021   Chronic pain syndrome 09/05/2021   Chronic nausea 09/05/2021   Neurogenic bladder, followed by Urology, instructed I&O cath BID 09/05/2021   Intolerance of continuous positive airway pressure (CPAP) ventilation 09/03/2021   Insomnia secondary to chronic pain 07/24/2021   Primary osteoarthritis of right knee 07/03/2021   Myofascial pain dysfunction syndrome 07/03/2021  Fibromyalgia 07/03/2021   Hyponatremia 07/03/2021   Primary osteoarthritis of left knee 07/03/2021   Body mass index (BMI) 50.0-59.9, adult ([place]) 03/07/2021   Allergic rhinitis due to animal (cat) (dog) hair and dander 10/06/2020   Allergic rhinitis due to pollen 10/06/2020   Moderate persistent asthma, uncomplicated 27/01/2375   Cerebellar mass 10/06/2020   Paresthesia 05/27/2020   Diabetes mellitus ([place]) 05/25/2020   Hypertension associated with diabetes ([place]) 05/25/2020   Hyperlipidemia associated with type 2 diabetes mellitus ([place]) 05/25/2020   Vitamin D deficiency 05/25/2020   NAFLD (nonalcoholic fatty liver disease) 05/25/2020   Sciatica of left side  05/25/2020    REFERRING DIAG: D49.6 (ICD-10-CM) - Neoplasm of unspecified behavior of brain    THERAPY DIAG:  Other lack of coordination  Other abnormalities of gait and mobility  Unsteadiness on feet  Muscle weakness (generalized)  PERTINENT HISTORY: From H&P note on 11/15/2021:  "On the day of admission 11/07/2021, the patient was taken to the operating room by Dr. Recardo Evangelist of neurosurgery and underwent left suboccipital craniotomy, resection of cerebellar tumor. She tolerated the procedure well. Postoperative MRI performed on 3/22 revealed a small area of diffusion restriction in the left cerebellum."  Patient transferred to [place] on 11/15/2021, discharged 11/24/2021 per pt.  fibromyalgia, DM2, asthma, HTN, OSA, migraines, bilateral knee OA, myofascial pain syndrome    PRECAUTIONS: Fall  SUBJECTIVE: Pt states she had a syncopal episode on 01/24/2022 where she hit the floor, but was uninjured.  Pt states Dr. Dagoberto Ligas is aware of this.  Pt has had her trigger shots from last MD visit which she states is helping her discomfort.  She requests to review hip stretching.  She states she has not been using her seated stepper in favor of walking in her home more.  She has furniture walked without other AD in home without a resultant fall.  PAIN:  She endorses same pain from prior session. Are you having pain? Yes: NPRS scale: 8.5/10 Pain location: All over (fibromyalgia), worse in head and neck and BLE (LLE worse)  - she feels a cutting-like sensation in the left posterior part of her head not quite over the incision (incision intact and pink)   OBJECTIVE:    DIAGNOSTIC FINDINGS: From Brain MRI on 09/02/2021:  "Increased size of left superior cerebellar vermis mass, now measuring 1.5 x 1.5 cm, previously 1.1 x 1.0 cm. Increased mild surrounding edema."  Future brain MRI ordered w/o date visible at this time.   COGNITION: Overall cognitive status: No family/caregiver present to determine baseline  cognitive functioning - pt states "I feel stupid", she elaborates that she is having a hard time processing.     TODAY'S TREATMENT:  THEREX: -SciFit x8 minutes BLEs only for reciprocal movement, dynamic warmup and LE strengthening. -Side-lying IT band stretch x45 seconds w/ PT provided overpressure -Supine hip flexor stretch from EOM x45 seconds  GAIT: Gait pattern: step through pattern, decreased hip/knee flexion- Right, decreased hip/knee flexion- Left, Right foot flat, Left foot flat, lateral lean- Right, lateral lean- Left, trunk flexed, and wide BOS Distance walked: 345' (rollator) +  Assistive device utilized: Environmental consultant - 4 wheeled Level of assistance: SBA Comments: Cued for upright posture and pacing of steps to promote endurance and safety with rollator.    NMR: Using rollator pt navigates over small cones performing alt toe taps to each cone 2x10' At countertop pt performs forwards and backwards walking progressing to no UE support w/ intermittent CGA for stability during backwards ambulation  only.  Pt demos inc high guarding w/ UE during forward walking without AD or UE support.  Performed 6x8'.  PATIENT EDUCATION: Education details: Continue HEP and seated stepper (pt states she has one that she would like to use) x8-10 minutes in chair with back support to maintain posture, progress time as able by increments of 1-2 minutes a week. Person educated: Patient Education method: Explanation  Education comprehension: verbalized understanding and needs further education     HOME EXERCISE PROGRAM: Access Code: [email or web address] URL: [email or web address] Date: 01/16/2022 Prepared by: Elease Etienne  Exercises - Sit to Stand Without Arm Support  - 1 x daily - 7 x weekly - 3 sets - 10 reps - Standing March with Counter Support  - 1 x daily - 7 x weekly - 3 sets - 10 reps - Chin Tuck  - 1 x daily - 7 x weekly - 3 sets - 10 reps - Seated Cervical Sidebending Stretch  - 1 x  daily - 7 x weekly - 3 sets - 10 reps - Supine Bridge  - 1 x daily - 4 x weekly - 3 sets - 10 reps - Supine Active Straight Leg Raise  - 1 x daily - 4 x weekly - 2 sets - 8 reps - Forward and Backward Monster Walk with Counter Support  - 1 x daily - 4 x weekly - 3 sets - 10 reps - Seated Heel Raise  - 1 x daily - 4 x weekly - 2 sets - 12 reps - Single Leg Stance on Pillow  - 1 x daily - 4 x weekly - 1 sets - 4 reps - Heel Raises with Counter Support  - 1 x daily - 4 x weekly - 2 sets - 8 reps - Sidelying ITB Stretch off Table  - 1 x daily - 4 x weekly - 1 sets - 2 reps - 45 seconds hold  Initiated walking program 01/30/2022:  Walk in home 2 mins at a time, 5x per day with the RW or rollator.    GOALS: Goals reviewed with patient? Yes   SHORT TERM GOALS: Target date:  01/12/2022   Pt will be independent with initial strength and balance HEP with supervision from family. Baseline:  Established 12/06/2021, pt compliant intermittently Goal status: PARTIALLY MET   2.  Pt will decrease 5xSTS to <35 seconds w/ BUE support in order to demonstrate decreased risk for falls and improved functional bilateral LE strength and power. Baseline: 38.51sec w/ BUE; 21.94 sec w/ BUE support Goal status: MET   3.  Pt will demonstrate a gait speed of >1 feet/sec in order to decrease risk for falls. Baseline: 0.76 ft/sec;  3.03 ft/sec w/ RW Goal status: MET   4.  Pt will improve normal TUG to less than or equal to 30 seconds w/LRAD for improved functional mobility and decreased fall risk.  Baseline: 34.87s w/RW and S*; 18.91 seconds w/ RW and S* Goal status: MET   5.  Pt will improve Berg score to 41/56 for decreased fall risk  Baseline: 35/56 on 4/19; 38/56 Goal status: PARTIALLY MET   LONG TERM GOALS: Target date:  02/23/2022   Pt will report return to some form of aerobic activity with maintenance of advanced HEP for strength and balance. Baseline: To be established Goal status: INITIAL   2.  Pt  will decrease 5xSTS to <20 seconds w/ BUE support in order to demonstrate decreased risk for falls and improved functional bilateral LE strength  and power. Baseline: 38.51 sec w/ BUE support; 21.94 seconds 01/09/2022 Goal status: REVISED   3.  Pt will demonstrate a gait speed of >3.3 feet/sec in order to decrease risk for falls. Baseline: 0.76 ft/sec; 3.03 ft/sec w/ RW Goal status: REVISED   4.  Pt will improve normal TUG to less than or equal to 16 seconds w/LRAD for improved functional mobility and decreased fall risk.  Baseline: 34.87s w/RW and S*; 18.91s w/ RW and S* Goal status: REVISED   5.  Pt will improve Berg score to 48/56 for decreased fall risk  Baseline: 35/56 on 4/19 Goal status: INITIAL   6.  Pt will tolerate >64mns of continuous activity in order to promote activity tolerance and endurance. Baseline: Pt extremely fatigued during session, nodding off intermittently. Goal status: INITIAL   ASSESSMENT:   CLINICAL IMPRESSION: This session used to review stretching component of HEP and to progress gait training with rollator.  Pt demonstrates improved balance and control navigating gym environment with rollator compared to prior session attempt.  Continued to assess dynamic balance w/o UE support w/ pt requiring intermittent CGA for stability when walking backwards.  Overall she is progressing towards LTGs to be assessed next session with plan for re-cert.    OBJECTIVE IMPAIRMENTS Abnormal gait, decreased activity tolerance, decreased balance, decreased cognition, decreased coordination, decreased endurance, decreased knowledge of condition, decreased knowledge of use of DME, decreased mobility, difficulty walking, decreased ROM, decreased strength, and decreased safety awareness.    ACTIVITY LIMITATIONS cleaning, community activity, driving, meal prep, laundry, and shopping.    PERSONAL FACTORS Age, Behavior pattern, Fitness, and 3+ comorbidities: fibromyalgia, DM2, asthma,  HTN, OSA, migraines, bilateral knee OA, myofascial pain syndrome  are also affecting patient's functional outcome.      REHAB POTENTIAL: Fair hx of fibromyalgia, DM2, asthma, HTN, OSA, migraines, bilateral knee OA, myofascial pain syndrome , personal factors   CLINICAL DECISION MAKING: Evolving/moderate complexity   EVALUATION COMPLEXITY: Moderate   PLAN: PT FREQUENCY: 1x/week   PT DURATION: 12 weeks   PLANNED INTERVENTIONS: Therapeutic exercises, Therapeutic activity, Neuromuscular re-education, Balance training, Gait training, Patient/Family education, Joint mobilization, Stair training, Vestibular training, DME instructions, Manual therapy, and self care and home management   PLAN FOR NEXT SESSION: Assess LTGs-update and re-cert for 1x/wk for 8 more weeks.  NEW [employer] FORM.  Use SciFit for endurance and strengthening w/ BUE/BLE.  Gait w/ rollator vs RW-for safety in home as pt uses in home intermittently.  Add to/revise HEP as needed.  Endurance, BLE strength, gait training w/ RW management, 6" step ups - dec UE support, quad strengthening, work on backwards walking, STS w/ overhead press     MBary Richard PT, DPT 01/30/2022, 9:47 AM

## 2022-02-01 DIAGNOSIS — J3081 Allergic rhinitis due to animal (cat) (dog) hair and dander: Secondary | ICD-10-CM | POA: Diagnosis not present

## 2022-02-01 DIAGNOSIS — J301 Allergic rhinitis due to pollen: Secondary | ICD-10-CM | POA: Diagnosis not present

## 2022-02-01 DIAGNOSIS — J3089 Other allergic rhinitis: Secondary | ICD-10-CM | POA: Diagnosis not present

## 2022-02-07 ENCOUNTER — Emergency Department (HOSPITAL_COMMUNITY)
Admission: EM | Admit: 2022-02-07 | Discharge: 2022-02-08 | Disposition: A | Discharge status: Home / Self Care | Payer: Medicare HMO | Attending: Emergency Medicine | Admitting: Emergency Medicine

## 2022-02-07 ENCOUNTER — Emergency Department (HOSPITAL_COMMUNITY): Payer: Medicare HMO

## 2022-02-07 ENCOUNTER — Other Ambulatory Visit: Payer: Self-pay

## 2022-02-07 ENCOUNTER — Encounter (HOSPITAL_COMMUNITY): Payer: Self-pay

## 2022-02-07 DIAGNOSIS — Z9889 Other specified postprocedural states: Secondary | ICD-10-CM | POA: Diagnosis not present

## 2022-02-07 DIAGNOSIS — I10 Essential (primary) hypertension: Secondary | ICD-10-CM | POA: Insufficient documentation

## 2022-02-07 DIAGNOSIS — H538 Other visual disturbances: Secondary | ICD-10-CM | POA: Diagnosis not present

## 2022-02-07 DIAGNOSIS — E119 Type 2 diabetes mellitus without complications: Secondary | ICD-10-CM | POA: Diagnosis not present

## 2022-02-07 DIAGNOSIS — Z79899 Other long term (current) drug therapy: Secondary | ICD-10-CM | POA: Insufficient documentation

## 2022-02-07 DIAGNOSIS — G4489 Other headache syndrome: Secondary | ICD-10-CM | POA: Diagnosis not present

## 2022-02-07 DIAGNOSIS — R519 Headache, unspecified: Secondary | ICD-10-CM | POA: Diagnosis not present

## 2022-02-07 LAB — URINALYSIS, ROUTINE W REFLEX MICROSCOPIC
Bilirubin Urine: NEGATIVE
Glucose, UA: NEGATIVE mg/dL
Hgb urine dipstick: NEGATIVE
Ketones, ur: 20 mg/dL — AB
Nitrite: NEGATIVE
Protein, ur: NEGATIVE mg/dL
Specific Gravity, Urine: 1.015 (ref 1.005–1.030)
pH: 7 (ref 5.0–8.0)

## 2022-02-07 LAB — COMPREHENSIVE METABOLIC PANEL
ALT: 15 U/L (ref 0–44)
AST: 10 U/L — ABNORMAL LOW (ref 15–41)
Albumin: 4.3 g/dL (ref 3.5–5.0)
Alkaline Phosphatase: 140 U/L — ABNORMAL HIGH (ref 38–126)
Anion gap: 10 (ref 5–15)
BUN: 14 mg/dL (ref 6–20)
CO2: 24 mmol/L (ref 22–32)
Calcium: 9.6 mg/dL (ref 8.9–10.3)
Chloride: 109 mmol/L (ref 98–111)
Creatinine, Ser: 0.53 mg/dL (ref 0.44–1.00)
GFR, Estimated: >60 mL/min (ref >60)
Glucose, Bld: 111 mg/dL — ABNORMAL HIGH (ref 70–99)
Potassium: 3.5 mmol/L (ref 3.5–5.1)
Sodium: 143 mmol/L (ref 135–145)
Total Bilirubin: 0.4 mg/dL (ref 0.3–1.2)
Total Protein: 7.4 g/dL (ref 6.5–8.1)

## 2022-02-07 LAB — CBC
HCT: 38 % (ref 36.0–46.0)
Hemoglobin: 11.6 g/dL — ABNORMAL LOW (ref 12.0–15.0)
MCH: 26 pg (ref 26.0–34.0)
MCHC: 30.5 g/dL (ref 30.0–36.0)
MCV: 85.2 fL (ref 80.0–100.0)
Platelets: 537 10*3/uL — ABNORMAL HIGH (ref 150–400)
RBC: 4.46 MIL/uL (ref 3.87–5.11)
RDW: 17.8 % — ABNORMAL HIGH (ref 11.5–15.5)
WBC: 11.9 10*3/uL — ABNORMAL HIGH (ref 4.0–10.5)
nRBC: 0 % (ref 0.0–0.2)

## 2022-02-07 LAB — SEDIMENTATION RATE: Sed Rate: 26 mm/hr — ABNORMAL HIGH (ref 0–22)

## 2022-02-07 MED ORDER — SODIUM CHLORIDE 0.9% FLUSH: 10.0000 mL | INTRAVENOUS | Status: DC | PRN

## 2022-02-07 MED ORDER — SODIUM CHLORIDE 0.9% FLUSH
10.0000 mL | Freq: Two times a day (BID) | INTRAVENOUS | Status: DC
  Administered 2022-02-07: 10 mL

## 2022-02-07 MED ORDER — ONDANSETRON 4 MG PO TBDP
4.0000 mg | ORAL_TABLET | Freq: Once | ORAL | Status: AC
  Administered 2022-02-07: 4 mg via ORAL
  Filled 2022-02-07: qty 1

## 2022-02-07 MED ORDER — CHLORHEXIDINE GLUCONATE CLOTH 2 % EX PADS: 6.0000 | MEDICATED_PAD | Freq: Every day | CUTANEOUS | Status: DC

## 2022-02-07 MED ORDER — IOHEXOL 350 MG/ML SOLN
75.0000 mL | Freq: Once | INTRAVENOUS | Status: AC | PRN
  Administered 2022-02-07: 75 mL via INTRAVENOUS

## 2022-02-07 MED ORDER — DROPERIDOL 2.5 MG/ML IJ SOLN
1.2500 mg | Freq: Once | INTRAMUSCULAR | Status: AC
  Administered 2022-02-07: 1.25 mg via INTRAVENOUS
  Filled 2022-02-07: qty 2

## 2022-02-07 NOTE — ED Provider Triage Note (Addendum)
Emergency Medicine Provider Triage Evaluation Note  ANTONIETA SLAVEN , a 61 y.o. female  was evaluated in triage.  Pt complains of headaches and elevated BP.  Pt is a 60 yr old female with hx of fibromyalgia- dx'd at age 73, DM2,- Ac1 6.2;  asthma; HTN, OSA, BMI is 50; Nodule in L superior cerebellum s/p removal. Also has migraines- intractable and daily; has trace aura  Here for HA states it is constant since surgery in march. Worse today  No weakness NV or fatigue. No CP or SOB. Seems EMS brought pt from MD office for elevated BP but also PCP ?had concerns about her following up.   Review of Systems  Positive: HA  Negative: Fever   Physical Exam  BP (!) 164/104 (BP Location: Right Arm)   Pulse (!) 103   Temp 98.6 F (37 C) (Oral)   Resp 18   Ht '5\' 2"'$  (1.575 m)   Wt 125 kg   LMP  (LMP Unknown)   SpO2 99%   BMI 50.40 kg/m  Gen:   Awake, no distress   Resp:  Normal effort  MSK:   Moves extremities without difficulty  Other:  Smile symmetric, grips 5/5 BL moves all four extremities.   Medical Decision Making  Medically screening exam initiated at 2:18 PM.  Appropriate orders placed.  Lawan A Lariccia was informed that the remainder of the evaluation will be completed by another provider, this initial triage assessment does not replace that evaluation, and the importance of remaining in the ED until their evaluation is complete.  Labs, ct head   Tedd Sias, [place] 02/07/22 1420    Pati Gallo [place], [place] 02/07/22 1422

## 2022-02-07 NOTE — ED Notes (Signed)
Patient transported to CT 

## 2022-02-07 NOTE — ED Triage Notes (Signed)
Pt BIB [facility] from a drs office for hypertension. Per the drs office she had brain surgery in March and they felt like she needed to come here. Pt endorse bilateral jaw pain and a headache. Per EMS it seemed like the drs office was more concerned with her not following up like she should.

## 2022-02-07 NOTE — ED Provider Notes (Cosign Needed)
[facility] DEPARTMENT Provider Note   CSN: [id number] Arrival date & time: 02/07/22  1305     History  Chief Complaint  Patient presents with   Hypertension    Virginia Luna is a 61 y.o. female.  The history is provided by the patient and medical records. No language interpreter was used.  Hypertension    61 year old female significant history of cerebellar tumor, migraine, hypertension, diabetes, hyperlipidemia, fibromyalgia, chronic pain syndrome sent here from PCPs office via EMS for evaluation of high blood pressure and headache.  Patient states she had a regular wellness visit with her PCP today.  She mentions she has had recurrent pain to the left side of her head since her surgery to remove the brain tumor back in March.  She did notice increasing headache throughout the day today involving her entire head, she felt shaky and dizzy and also having jaw pain.  Headache is more than usual.  No fever diplopia cold symptoms neck stiffness focal numbness focal weakness or rash.  No recent medication changes.  She was noted to have elevated blood pressure and felt it may be related to her headache.  She also endorsed history of migraine headache usually happen twice monthly but states this felt different.  She is unsure what triggers the headache.  Her PCP was concerned since patient has prior brain surgery therefore she was recommended to come to the ER to have head CT scan.  Home Medications Prior to Admission medications   Medication Sig Start Date End Date Taking? Authorizing Provider  acetaminophen (TYLENOL) 325 MG tablet Take 1-2 tablets (325-650 mg total) by mouth every 4 (four) hours as needed for mild pain. 11/24/21   Setzer, Edman Circle, PA-C  albuterol (PROVENTIL) (2.5 MG/3ML) 0.083% nebulizer solution Take 3 mLs (2.5 mg total) by nebulization every 6 (six) hours as needed for up to 14 days for wheezing or shortness of breath. 11/24/21 12/08/21  Setzer,  Edman Circle, PA-C  amLODipine (NORVASC) 5 MG tablet Take 1 tablet (5 mg total) by mouth daily. 11/24/21   Setzer, Edman Circle, PA-C  atorvastatin (LIPITOR) 10 MG tablet Take 1 tablet (10 mg total) by mouth at bedtime. 11/24/21   Setzer, Edman Circle, PA-C  azelastine (ASTELIN) 0.1 % nasal spray Place 1 spray into both nostrils 2 (two) times daily as needed for rhinitis. 11/24/21   Setzer, Edman Circle, PA-C  levocetirizine (XYZAL) 5 MG tablet Take 1 tablet (5 mg total) by mouth every evening. 11/24/21   Setzer, Edman Circle, PA-C  linaclotide (LINZESS) 72 MCG capsule Take 1 capsule (72 mcg total) by mouth daily as needed (constipation). 06/14/21   Briscoe Deutscher, DO  meclizine (ANTIVERT) 25 MG tablet Take 1 tablet (25 mg total) by mouth 3 (three) times daily as needed for dizziness. 11/24/21   Setzer, Edman Circle, PA-C  melatonin 5 MG TABS Take 1 tablet (5 mg total) by mouth at bedtime. 11/24/21   Setzer, Edman Circle, PA-C  methocarbamol (ROBAXIN) 750 MG tablet Take 1 tablet (750 mg total) by mouth every 8 (eight) hours as needed for muscle spasms. 11/24/21   Setzer, Edman Circle, PA-C  montelukast (SINGULAIR) 10 MG tablet Take 1 tablet (10 mg total) by mouth at bedtime. 11/24/21   Setzer, Edman Circle, PA-C  ondansetron (ZOFRAN ODT) 4 MG disintegrating tablet Take 1 tablet (4 mg total) by mouth every 6 (six) hours as needed for nausea or vomiting. 11/24/21   Setzer, Edman Circle, PA-C  ondansetron (  ZOFRAN) 4 MG tablet Take 1 tablet (4 mg total) by mouth every 8 (eight) hours as needed for nausea or vomiting. 12/06/21   Lovorn, Jinny Blossom, MD  Oxcarbazepine (TRILEPTAL) 300 MG tablet Take 3 tablets (900 mg total) by mouth at bedtime. For nerve pain 01/08/22   Lovorn, Jinny Blossom, MD  pantoprazole (PROTONIX) 40 MG tablet Take 1 tablet (40 mg total) by mouth daily. 11/24/21   Setzer, Edman Circle, PA-C  polyethylene glycol (MIRALAX / GLYCOLAX) 17 g packet Take 17 g by mouth daily as needed for mild constipation. 11/24/21   Setzer, Edman Circle, PA-C  QUEtiapine (SEROQUEL) 300 MG  tablet Take 1 tablet (300 mg total) by mouth at bedtime. 11/24/21   Setzer, Edman Circle, PA-C  Semaglutide,0.25 or 0.'5MG'$ /DOS, (OZEMPIC, 0.25 OR 0.5 MG/DOSE,) 2 MG/1.5ML SOPN Inject 0.5 mg into the skin once a week. Patient taking differently: Inject 0.5 mg into the skin every Sunday. 09/05/21   Briscoe Deutscher, DO  senna-docusate (SENOKOT-S) 8.6-50 MG tablet Take 2 tablets by mouth at bedtime. 11/24/21   Setzer, Edman Circle, PA-C  SUMAtriptan (IMITREX) 100 MG tablet Take 1 tablet (100 mg total) by mouth every 2 (two) hours as needed for migraine or headache (max 2x in 24 hour period). May repeat in 2 hours if headache persists or recurs. 11/24/21   Setzer, Edman Circle, PA-C  Vitamin D, Ergocalciferol, (DRISDOL) 1.25 MG (50000 UNIT) CAPS capsule TAKE ONE CAPSULE BY MOUTH EVERY 7 DAYS Patient taking differently: Take 50,000 Units by mouth every Sunday. 09/05/21   Briscoe Deutscher, DO  zolpidem (AMBIEN) 10 MG tablet Take 1 tablet (10 mg total) by mouth at bedtime. 11/24/21   Setzer, Edman Circle, PA-C  zonisamide (ZONEGRAN) 100 MG capsule Take 1 capsule (100 mg total) by mouth daily. 12/29/21   Pieter Partridge, DO      Allergies    Rocephin [ceftriaxone], Morphine and related, Prednisone, Sulfa antibiotics, Amitriptyline, and Penicillin g sodium    Review of Systems   Review of Systems  All other systems reviewed and are negative.   Physical Exam Updated Vital Signs BP (!) 162/100 (BP Location: Left Arm)   Pulse 88   Temp 98 F (36.7 C) (Oral)   Resp 17   Ht '5\' 2"'$  (1.575 m)   Wt 125 kg   LMP  (LMP Unknown)   SpO2 100%   BMI 50.40 kg/m  Physical Exam Vitals and nursing note reviewed.  Constitutional:      General: She is not in acute distress.    Appearance: She is well-developed. She is obese.  HENT:     Head: Atraumatic.  Eyes:     Conjunctiva/sclera: Conjunctivae normal.  Cardiovascular:     Rate and Rhythm: Normal rate and regular rhythm.     Pulses: Normal pulses.     Heart sounds: Normal heart  sounds.  Pulmonary:     Effort: Pulmonary effort is normal.  Abdominal:     Palpations: Abdomen is soft.     Tenderness: There is no abdominal tenderness.  Musculoskeletal:     Cervical back: Neck supple.  Skin:    Findings: No rash.  Neurological:     Mental Status: She is alert and oriented to person, place, and time.     Comments: Neurologic exam:  Speech clear, pupils equal round reactive to light, extraocular movements intact  Normal peripheral visual fields Cranial nerves III through XII normal including no facial droop Follows commands, moves all extremities x4, normal strength to bilateral  upper and lower extremities at all major muscle groups including grip Sensation normal to light touch  Coordination intact, no limb ataxia, finger-nose-finger normal Rapid alternating movements normal No pronator drift Gait normal   Psychiatric:        Mood and Affect: Mood normal.     ED Results / Procedures / Treatments   Labs (all labs ordered are listed, but only abnormal results are displayed) Labs Reviewed  CBC - Abnormal; Notable for the following components:      Result Value   WBC 11.9 (*)    Hemoglobin 11.6 (*)    RDW 17.8 (*)    Platelets 537 (*)    All other components within normal limits  COMPREHENSIVE METABOLIC PANEL - Abnormal; Notable for the following components:   Glucose, Bld 111 (*)    AST 10 (*)    Alkaline Phosphatase 140 (*)    All other components within normal limits  URINALYSIS, ROUTINE W REFLEX MICROSCOPIC - Abnormal; Notable for the following components:   Ketones, ur 20 (*)    Leukocytes,Ua SMALL (*)    Bacteria, UA FEW (*)    All other components within normal limits  SEDIMENTATION RATE - Abnormal; Notable for the following components:   Sed Rate 26 (*)    All other components within normal limits    EKG EKG Interpretation  Date/Time:  Wednesday February 07 2022 14:28:11 EDT Ventricular Rate:  97 PR Interval:  150 QRS Duration: 88 QT  Interval:  356 QTC Calculation: 452 R Axis:   18 Text Interpretation: Normal sinus rhythm Cannot rule out Anterior infarct , age undetermined Abnormal ECG When compared with ECG of 23-Nov-2020 09:21, PREVIOUS ECG IS PRESENT No significant change was found Confirmed by Ezequiel Essex [phone]) on 02/07/2022 6:45:34 PM  Radiology CT HEAD WO CONTRAST (5MM)  Result Date: 02/07/2022 CLINICAL DATA:  Headaches EXAM: CT HEAD WITHOUT CONTRAST TECHNIQUE: Contiguous axial images were obtained from the base of the skull through the vertex without intravenous contrast. RADIATION DOSE REDUCTION: This exam was performed according to the departmental dose-optimization program which includes automated exposure control, adjustment of the mA and/or kV according to patient size and/or use of iterative reconstruction technique. COMPARISON:  MR brain done on 09/02/2021 FINDINGS: Brain: No acute intracranial findings are seen. There are no signs of bleeding within the cranium. Ventricles are not dilated. Cortical sulci are prominent. There is previous left occipital craniotomy for removal of lesion in the left cerebellar vermis. There is subtle low-attenuation in the left cerebellum most likely postsurgical change. Vascular: Unremarkable. Skull: Previous left occipital craniotomy. Sinuses/Orbits: Unremarkable. Other: None. IMPRESSION: No acute intracranial findings are seen in noncontrast CT brain. Postsurgical changes are noted in the left cerebellum. Electronically Signed   By: Elmer Picker M.D.   On: 02/07/2022 15:10    Procedures Procedures    Medications Ordered in ED Medications  sodium chloride flush (NS) 0.9 % injection 10-40 mL (has no administration in time range)  sodium chloride flush (NS) 0.9 % injection 10-40 mL (has no administration in time range)  Chlorhexidine Gluconate Cloth 2 % PADS 6 each (has no administration in time range)  droperidol (INAPSINE) 2.5 MG/ML injection 1.25 mg (1.25 mg  Intravenous Given 02/07/22 2106)  ondansetron (ZOFRAN-ODT) disintegrating tablet 4 mg (4 mg Oral Given 02/07/22 2213)    ED Course/ Medical Decision Making/ A&P  Medical Decision Making Amount and/or Complexity of Data Reviewed Labs: ordered. Radiology: ordered.  Risk OTC drugs. Prescription drug management.   BP (!) 162/100 (BP Location: Left Arm)   Pulse 88   Temp 98 F (36.7 C) (Oral)   Resp 17   Ht '5\' 2"'$  (1.575 m)   Wt 125 kg   LMP  (LMP Unknown)   SpO2 100%   BMI 50.40 kg/m   7:09 PM This is a 61 year old female with prior cerebellar brain mass status post resection in March who was sent here from PCP office with concerns of worsening headache and elevated blood pressure.  Patient mention she was seen by her PCP for regular wellness check today but did report that her headache today is more intense than usual.  Headache is involving the entirety of her head with some jaw pain.  Patient also reported feeling shaky.  She noted that her blood pressure is elevated and attributed to headache.  She does not endorse any infectious symptoms.  On exam, patient is overall well-appearing appears to be in no acute distress, normal phonation, she has no focal neurodeficit on exam she has no nuchal rigidity on exam and she is in no acute discomfort.  Labs, EKG, and imaging independently viewed and interpreted by me and agree with radiologist interpretation.  Her electrolyte panels are reassuring, no significant signs of anemia, urinalysis without signs of urinary tract infection, CT scan of her head without any acute intracranial finding with some postsurgical changes are noted in the left cerebellum.  She does not exhibit any symptoms concerning for mastoiditis. She does not have a temporal bruit or tenderness on exam to suggest temporal arteritis.  Since headache is new according to patient, after discussion with Dr. Wyvonnia Dusky, I will obtain head CT angiogram, check sed  rate, and will give patient droperidol for headache.  Will monitor closely.  EKG reviewed by me and no evidence of prolonged QT  7:53 PM Appreciate curbside consultation from on call neurologist Dr. Quinn Axe who agrees with current work up.  If head CTA negative, then to obtain brain MRI w and w/out CM.  Also to increase her zonisamide '100mg'$  qhs to '200mg'$  qhs for better headache control if work up is negative.    10:07 PM Unfortunately CT scan staff notified that patient's IV have infiltrated.  We will place another IV access so that patient can receive head CT scan.  She appears tremulous and nauseous, Zofran given.  11:40 PM Patient signed out to oncoming team who will follow-up on his CT angiogram.  If negative, patient will need brain MRI with and without contrast and if that is also negative then she should have her zonisamide increase to '200mg'$  qhs and outpt f/u.  Low suspicion for temporal arteritis despite mildly increase sed rate of 25.  This patient presents to the ED for concern of headache, this involves an extensive number of treatment options, and is a complaint that carries with it a high risk of complications and morbidity.  The differential diagnosis includes migraine headache, intracranial tumor, pseudotumor cerebri, idiopathic intracranial hypertension, temporal arteritis, stroke, temporal arteritis, SAH, hypertensive emergency  Co morbidities that complicate the patient evaluation cerebellar tumor  Migraine  Fibromyalgia   Additional history obtained:  Additional history obtained from patient External records from outside source obtained and reviewed including notes from PCP from today  Lab Tests:  I Ordered, and personally interpreted labs.  The pertinent results include:  as above  Imaging Studies  ordered:  I ordered imaging studies including head CT I independently visualized and interpreted imaging which showed no acute finding I agree with the radiologist  interpretation  Cardiac Monitoring:  The patient was maintained on a cardiac monitor.  I personally viewed and interpreted the cardiac monitored which showed an underlying rhythm of: NSR  Medicines ordered and prescription drug management:  I ordered medication including droperidol  for headache Reevaluation of the patient after these medicines showed that the patient improved I have reviewed the patients home medicines and have made adjustments as needed  Test Considered: as above  Critical Interventions: droperidol for headache  Consulted to neurology Dr. Quinn Axe who recommend further work up with brain MRI  Problem List / ED Course: headache  tremors  Reevaluation:  After the interventions noted above, I reevaluated the patient and found that they have :stayed the same  Social Determinants of Health:   Dispostion:  After consideration of the diagnostic results and the patients response to treatment, I feel that the patent would benefit from further work up.         Final Clinical Impression(s) / ED Diagnoses Final diagnoses:  None    Rx / DC Orders ED Discharge Orders     None         Domenic Moras, PA-C 02/07/22 2342

## 2022-02-07 NOTE — ED Notes (Signed)
ED Provider at bedside. 

## 2022-02-08 ENCOUNTER — Ambulatory Visit: Payer: Medicare HMO | Admitting: Occupational Therapy

## 2022-02-08 ENCOUNTER — Ambulatory Visit: Payer: Medicare HMO | Admitting: Physical Therapy

## 2022-02-08 ENCOUNTER — Ambulatory Visit: Payer: Medicare HMO | Admitting: Speech Pathology

## 2022-02-08 ENCOUNTER — Emergency Department (HOSPITAL_COMMUNITY): Payer: Medicare HMO

## 2022-02-08 DIAGNOSIS — J3081 Allergic rhinitis due to animal (cat) (dog) hair and dander: Secondary | ICD-10-CM | POA: Diagnosis not present

## 2022-02-08 DIAGNOSIS — J301 Allergic rhinitis due to pollen: Secondary | ICD-10-CM | POA: Diagnosis not present

## 2022-02-08 DIAGNOSIS — J3089 Other allergic rhinitis: Secondary | ICD-10-CM | POA: Diagnosis not present

## 2022-02-08 DIAGNOSIS — R519 Headache, unspecified: Secondary | ICD-10-CM | POA: Diagnosis not present

## 2022-02-08 MED ORDER — ZONISAMIDE 100 MG PO CAPS: 200.0000 mg | ORAL_CAPSULE | Freq: Every day | ORAL | 5 refills | Status: DC

## 2022-02-08 MED ORDER — GADOBUTROL 1 MMOL/ML IV SOLN
10.0000 mL | Freq: Once | INTRAVENOUS | Status: AC | PRN
  Administered 2022-02-08: 10 mL via INTRAVENOUS

## 2022-02-08 NOTE — ED Notes (Signed)
Patient transported to MRI 

## 2022-02-08 NOTE — ED Notes (Signed)
RN reviewed discharge instructions with pt. Pt verbalized understanding and had no further questions. VSS upon discharge.  

## 2022-02-13 ENCOUNTER — Ambulatory Visit: Payer: Medicare HMO | Admitting: Speech Pathology

## 2022-02-13 ENCOUNTER — Ambulatory Visit: Payer: Medicare HMO | Admitting: Occupational Therapy

## 2022-02-13 ENCOUNTER — Other Ambulatory Visit: Payer: Self-pay | Admitting: Neurology

## 2022-02-13 DIAGNOSIS — R208 Other disturbances of skin sensation: Secondary | ICD-10-CM

## 2022-02-13 DIAGNOSIS — R41840 Attention and concentration deficit: Secondary | ICD-10-CM

## 2022-02-13 DIAGNOSIS — M6281 Muscle weakness (generalized): Secondary | ICD-10-CM | POA: Diagnosis not present

## 2022-02-13 DIAGNOSIS — R2681 Unsteadiness on feet: Secondary | ICD-10-CM | POA: Diagnosis not present

## 2022-02-13 DIAGNOSIS — R471 Dysarthria and anarthria: Secondary | ICD-10-CM | POA: Diagnosis not present

## 2022-02-13 DIAGNOSIS — R2689 Other abnormalities of gait and mobility: Secondary | ICD-10-CM

## 2022-02-13 DIAGNOSIS — R41841 Cognitive communication deficit: Secondary | ICD-10-CM

## 2022-02-13 DIAGNOSIS — R4701 Aphasia: Secondary | ICD-10-CM | POA: Diagnosis not present

## 2022-02-13 DIAGNOSIS — R278 Other lack of coordination: Secondary | ICD-10-CM

## 2022-02-13 DIAGNOSIS — R41844 Frontal lobe and executive function deficit: Secondary | ICD-10-CM

## 2022-02-13 NOTE — Therapy (Signed)
OUTPATIENT OCCUPATIONAL THERAPY TREATMENT NOTE   Patient Name: Virginia Luna MRN: [id number] DOB:08-29-1960, 61 y.o., female Today's Date: 02/13/2022  PCP: Shirline Frees, MD REFERRING PROVIDER: Dr.Lovorn/ Risa Grill PA-C   OCCUPATIONAL THERAPY DISCHARGE SUMMARY    Current functional level related to goals / functional outcomes: Pt made good overall progress.  She agrees with plans for d/c.   Remaining deficits: Decreased strength, decreased coordination, decreased balance, pain, decreased activity tolerance.   Education / Equipment: Pt was instructed in safety for IADLs due to sensory deficits and HEP. Pt. demonstrates understanding.   Patient agrees to discharge. Patient goals were partially met. Patient is being discharged due to being pleased with the current functional level..     END OF SESSION:   OT End of Session - 02/13/22 0851     Visit Number 13    Number of Visits 25    Date for OT Re-Evaluation 02/20/22    Authorization Type [employer]    Authorization - Visit Number 13    Authorization - Number of Visits 25    OT Start Time 0850    OT Stop Time 0930    OT Time Calculation (min) 40 min    Activity Tolerance Patient tolerated treatment well    Behavior During Therapy WFL for tasks assessed/performed                  Past Medical History:  Diagnosis Date   Allergies    Anemia    Arthritis    Asthma    Back pain    Chronic pain    Complication of anesthesia    woke up during colonoscopy   Diabetes ([place])    Diabetes mellitus without complication (HCC)    Edema, lower extremity    Fibroid    Fibromyalgia    Chronic   GERD (gastroesophageal reflux disease)    Headache    migraines   High blood pressure    History of hiatal hernia    History of stomach ulcers    IBS (irritable bowel syndrome)    Joint pain    Sleep apnea    did use cpap-lost 25lb-says she does not need it   NO CPAP   Wears contact lenses    Past  Surgical History:  Procedure Laterality Date   BRAIN SURGERY Left 11/07/2021   [facility] Dr. Herschel [place]   BREAST BIOPSY     BREAST EXCISIONAL BIOPSY     BREAST LUMPECTOMY WITH RADIOACTIVE SEED LOCALIZATION Bilateral 11/24/2014   Procedure: BILATERAL BREAST LUMPECTOMY WITH RADIOACTIVE SEED LOCALIZATION;  Surgeon: [name], MD;  Location: [place];  Service: General;  Laterality: Bilateral;   CHOLECYSTECTOMY     COLONOSCOPY     DILATION AND CURETTAGE OF UTERUS     ESOPHAGOGASTRODUODENOSCOPY (EGD) WITH PROPOFOL N/A 07/23/2016   Procedure: ESOPHAGOGASTRODUODENOSCOPY (EGD) WITH PROPOFOL;  Surgeon: Laurence Spates, MD;  Location: [facility];  Service: Endoscopy;  Laterality: N/A;   ESOPHAGOGASTRODUODENOSCOPY (EGD) WITH PROPOFOL N/A 06/11/2018   Procedure: ESOPHAGOGASTRODUODENOSCOPY (EGD) WITH PROPOFOL;  Surgeon: Laurence Spates, MD;  Location: [facility];  Service: Endoscopy;  Laterality: N/A;   LUMBAR LAMINECTOMY  2010   ORIF WRIST FRACTURE Right 11/24/2020   Procedure: OPEN REDUCTION INTERNAL FIXATION RIGHT WRIST FRACTURE;  Surgeon: Renette Butters, MD;  Location: [facility];  Service: Orthopedics;  Laterality: Right;   UMBILICAL HERNIA REPAIR     age 15   UPPER GI ENDOSCOPY     Patient Active  Problem List   Diagnosis Date Noted   Cerebellar tumor ([place]) 11/15/2021   Comorbid sleep-related hypoventilation 10/19/2021   Urinary incontinence/areflexic bladder 10/05/2021   OSA, unable to tolerate CPAP (vomited every night) 09/05/2021   Chronic pain syndrome 09/05/2021   Chronic nausea 09/05/2021   Neurogenic bladder, followed by Urology, instructed I&O cath BID 09/05/2021   Intolerance of continuous positive airway pressure (CPAP) ventilation 09/03/2021   Insomnia secondary to chronic pain 07/24/2021   Primary osteoarthritis of right knee 07/03/2021   Myofascial pain dysfunction syndrome 07/03/2021   Fibromyalgia 07/03/2021   Hyponatremia 07/03/2021   Primary  osteoarthritis of left knee 07/03/2021   Body mass index (BMI) 50.0-59.9, adult ([place]) 03/07/2021   Allergic rhinitis due to animal (cat) (dog) hair and dander 10/06/2020   Allergic rhinitis due to pollen 10/06/2020   Moderate persistent asthma, uncomplicated 03/00/9233   Cerebellar mass 10/06/2020   Paresthesia 05/27/2020   Diabetes mellitus ([place]) 05/25/2020   Hypertension associated with diabetes ([place]) 05/25/2020   Hyperlipidemia associated with type 2 diabetes mellitus ([place]) 05/25/2020   Vitamin D deficiency 05/25/2020   NAFLD (nonalcoholic fatty liver disease) 05/25/2020   Sciatica of left side 05/25/2020    ONSET DATE: 11/07/21   REFERRING DIAG: cerebellar tumor, s/p craniotomy   THERAPY DIAG:  Other lack of coordination  Other abnormalities of gait and mobility  Unsteadiness on feet  Muscle weakness (generalized)  Attention and concentration deficit  Other disturbances of skin sensation  Frontal lobe and executive function deficit   PERTINENT HISTORY: Virginia Luna is a 61 year old female who recently presented to hospitalized at [place] in [facility] complaining of headaches and tremors. 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 She was admitted 11/07/2021, underwent left suboccipital craniotomy, resection of cerebellar tumor. Postoperative MRI performed on 3/22 revealed a small area of diffusion restriction in the left cerebellum Pt was transferred to  [facility] 11/15/21 and  she was d/c home 11/24/21.PMH: arthritis, asthma, chronic pain, DM, fibromyalgia, myofascial pain syndrome, HTN, IBS, sleep apnea Brain   PRECAUTIONS: Fall, no driving  SUBJECTIVE: "I was supposed to bring my putty and I forgot cause I had to bring my epipen"  PAIN:  Are you having pain? Yes: NPRS scale: 8/10 Pain location: head and neck, and legs (all over, generalized) Pain description: constant Aggravating factors: surgery,  fibromyalgia, myofascial pain syndrome Relieving factors: meds    OBJECTIVE:   TODAY'S TREATMENT:   BP: 135/88 Therapist discussed plans for d/c with pt and checked goals. Pt is in agreement. See goals for progress. Red putty HEP review for grip and pinch, min v.c initially then pt returned demonstration. Arm bike level 3 x 6 mins for conditioning Pt agrees with plans for d/c.   HOME EXERCISE PROGRAM: putty HEP, coordination HEP, sh flex supine (12/06/21); BUE HEP (12/14/21) 02/13/22- cane exercises in seated, red putty review   GOALS:     SHORT TERM GOALS: Target date: 12/26/2021   I with HEP  Goal status: achieved   2.  Pt will perform dressing mod I in a reasonable amount of time Baseline: supervision, increased time required Goal status: GOAL MET pt reports completing dressing with supervision, 12/27/21   3.  Pt will perform basic home management with min A. Baseline: dependent Goal status: GOAL MET reports doing ironing 12/27/21   4.  Pt will perform simple cooking task with min a demonstrating good safety awareness. Baseline: dependent Goal status:  achieved,  pt used air fryer at home   5.  Pt will increase bilateral grip strength by 5 lbs for increased ease opening containers. Baseline RUE 21.1 lbs, LUE 14.9 lbs :  Goal status: GOAL MET RUE: 35.4   LUE 37.4   6.  Pt will demonstrate ability to stand for functional/ ADL tasks x 15 mins without rest break and no LOB. Baseline: poor and inconsistent endurance Goal status:  Achieved- 15.37 on 01/16/22   LONG TERM GOALS: Target date: 02/20/2022   I with updated HEP.   Goal status: met, cane in seated, continued putty   2.  Pt will perform all basic ADLS mod I Baseline: supervision Goal status:partially met, mod I for bathing and dressing supervision for tub transfers   3.  Pt will perform basic home management modified I Baseline: dependent Goal status: met cleaning loading laundry, light cooking   4.  Pt will  perform basic cooking mod I demonstrating good safety awareness. Baseline: depdnent Goal status: partially met , pt is cooking using microwave and air fryer, he has 1 episode of burning herself but she is aware, and reports she will wear oven mitts in the future.   5.  Pt will demonstrate improved fine motor coordination for ADLS as evidenced by decreasing 9 hole peg test score by 3 secs , bilaterally. Baseline: R 35.06, L 38.53 Goal status:  MET 21.40    R  L 33.29   6.  Pt will demonstrate ability to retrieve a 3 lbs weight from overhead shelf with left and right UE's individually demonstrating good control. Baseline: decreased bilateral strength and control. Goal status: MET   ASSESSMENT:   CLINICAL IMPRESSION:  Pt demonstrates good overall progress. She agrees with plans for d/c today.   PERFORMANCE DEFICITS in functional skills including ADLs, IADLs, coordination, dexterity, sensation, ROM, strength, pain, fascial restrictions, flexibility, FMC, GMC, mobility, balance, endurance, decreased knowledge of precautions, decreased knowledge of use of DME, vision, and UE functional use, cognitive skills including attention, energy/drive, learn, memory, problem solving, safety awareness, temperament/personality, thought, and understand, and psychosocial skills including coping strategies, environmental adaptation, and routines and behaviors. Pt can benefit from skilled OT to address these deficits.   IMPAIRMENTS are limiting patient from ADLs, IADLs, rest and sleep, play, leisure, and social participation.    COMORBIDITIES may have co-morbidities  that affects occupational performance. Patient will benefit from skilled OT to address above impairments and improve overall function.   MODIFICATION OR ASSISTANCE TO COMPLETE EVALUATION: No modification of tasks or assist necessary to complete an evaluation.   OT OCCUPATIONAL PROFILE AND HISTORY: Detailed assessment: Review of records and additional  review of physical, cognitive, psychosocial history related to current functional performance.   CLINICAL DECISION MAKING: LOW - limited treatment options, no task modification necessary   REHAB POTENTIAL: Good   EVALUATION COMPLEXITY: Low     PLAN: OT FREQUENCY: 2x/week   OT DURATION: 12 weeks plus eval   PLANNED INTERVENTIONS: self care/ADL training, therapeutic exercise, therapeutic activity, neuromuscular re-education, manual therapy, manual lymph drainage, passive range of motion, gait training, balance training, functional mobility training, aquatic therapy, splinting, electrical stimulation, ultrasound, paraffin, fluidotherapy, moist heat, cryotherapy, contrast bath, patient/family education, cognitive remediation/compensation, visual/perceptual remediation/compensation, energy conservation, coping strategies training, and DME and/or AE instructions   RECOMMENDED OTHER SERVICES: n/a   CONSULTED AND AGREED WITH PLAN OF CARE: Patient   PLAN FOR NEXT SESSION:  d/c OT     Alencia Gordon, OTR/L  02/13/2022, 8:55 AM Theone Murdoch, OTR/L [phone] Phone: [phone] 8:55 AM 02/13/22

## 2022-02-14 DIAGNOSIS — J301 Allergic rhinitis due to pollen: Secondary | ICD-10-CM | POA: Diagnosis not present

## 2022-02-14 DIAGNOSIS — J3089 Other allergic rhinitis: Secondary | ICD-10-CM | POA: Diagnosis not present

## 2022-02-14 DIAGNOSIS — J3081 Allergic rhinitis due to animal (cat) (dog) hair and dander: Secondary | ICD-10-CM | POA: Diagnosis not present

## 2022-02-16 DIAGNOSIS — E119 Type 2 diabetes mellitus without complications: Secondary | ICD-10-CM | POA: Diagnosis not present

## 2022-02-16 DIAGNOSIS — I10 Essential (primary) hypertension: Secondary | ICD-10-CM | POA: Diagnosis not present

## 2022-02-16 DIAGNOSIS — K219 Gastro-esophageal reflux disease without esophagitis: Secondary | ICD-10-CM | POA: Diagnosis not present

## 2022-02-16 DIAGNOSIS — E7800 Pure hypercholesterolemia, unspecified: Secondary | ICD-10-CM | POA: Diagnosis not present

## 2022-02-17 DIAGNOSIS — R32 Unspecified urinary incontinence: Secondary | ICD-10-CM | POA: Diagnosis not present

## 2022-02-17 DIAGNOSIS — R339 Retention of urine, unspecified: Secondary | ICD-10-CM | POA: Diagnosis not present

## 2022-02-19 ENCOUNTER — Ambulatory Visit: Payer: Medicare HMO | Attending: Physician Assistant | Admitting: Physical Therapy

## 2022-02-19 ENCOUNTER — Encounter: Payer: Self-pay | Admitting: Physical Therapy

## 2022-02-19 DIAGNOSIS — Z9181 History of falling: Secondary | ICD-10-CM | POA: Insufficient documentation

## 2022-02-19 DIAGNOSIS — R278 Other lack of coordination: Secondary | ICD-10-CM | POA: Diagnosis not present

## 2022-02-19 DIAGNOSIS — M6281 Muscle weakness (generalized): Secondary | ICD-10-CM | POA: Insufficient documentation

## 2022-02-19 DIAGNOSIS — R2689 Other abnormalities of gait and mobility: Secondary | ICD-10-CM | POA: Insufficient documentation

## 2022-02-19 DIAGNOSIS — R2681 Unsteadiness on feet: Secondary | ICD-10-CM | POA: Insufficient documentation

## 2022-02-19 NOTE — Therapy (Signed)
OUTPATIENT PHYSICAL THERAPY TREATMENT NOTE   Patient Name: Virginia Luna MRN: [id number] DOB:1961-02-16, 61 y.o., female Today's Date: 02/19/2022  PCP: Shirline Frees, MD REFERRING PROVIDER: Courtney Heys, MD  END OF SESSION:   PT End of Session - 02/19/22 0854     Visit Number 12    Number of Visits 21    Date for PT Re-Evaluation 05/04/22   pushed out for scheduling   Authorization Type [employer]    Progress Note Due on Visit 20    PT Start Time 0850    PT Stop Time 0929    PT Time Calculation (min) 39 min    Equipment Utilized During Treatment Gait belt    Activity Tolerance Patient tolerated treatment well    Behavior During Therapy WFL for tasks assessed/performed              Past Medical History:  Diagnosis Date   Allergies    Anemia    Arthritis    Asthma    Back pain    Chronic pain    Complication of anesthesia    woke up during colonoscopy   Diabetes ([place])    Diabetes mellitus without complication (HCC)    Edema, lower extremity    Fibroid    Fibromyalgia    Chronic   GERD (gastroesophageal reflux disease)    Headache    migraines   High blood pressure    History of hiatal hernia    History of stomach ulcers    IBS (irritable bowel syndrome)    Joint pain    Sleep apnea    did use cpap-lost 25lb-says she does not need it   NO CPAP   Wears contact lenses    Past Surgical History:  Procedure Laterality Date   BRAIN SURGERY Left 11/07/2021   [facility] Dr. Herschel [place]   BREAST BIOPSY     BREAST EXCISIONAL BIOPSY     BREAST LUMPECTOMY WITH RADIOACTIVE SEED LOCALIZATION Bilateral 11/24/2014   Procedure: BILATERAL BREAST LUMPECTOMY WITH RADIOACTIVE SEED LOCALIZATION;  Surgeon: [name], MD;  Location: [place];  Service: General;  Laterality: Bilateral;   CHOLECYSTECTOMY     COLONOSCOPY     DILATION AND CURETTAGE OF UTERUS     ESOPHAGOGASTRODUODENOSCOPY (EGD) WITH PROPOFOL N/A 07/23/2016   Procedure:  ESOPHAGOGASTRODUODENOSCOPY (EGD) WITH PROPOFOL;  Surgeon: Laurence Spates, MD;  Location: [facility];  Service: Endoscopy;  Laterality: N/A;   ESOPHAGOGASTRODUODENOSCOPY (EGD) WITH PROPOFOL N/A 06/11/2018   Procedure: ESOPHAGOGASTRODUODENOSCOPY (EGD) WITH PROPOFOL;  Surgeon: Laurence Spates, MD;  Location: [facility];  Service: Endoscopy;  Laterality: N/A;   LUMBAR LAMINECTOMY  2010   ORIF WRIST FRACTURE Right 11/24/2020   Procedure: OPEN REDUCTION INTERNAL FIXATION RIGHT WRIST FRACTURE;  Surgeon: Renette Butters, MD;  Location: [facility];  Service: Orthopedics;  Laterality: Right;   UMBILICAL HERNIA REPAIR     age 32   UPPER GI ENDOSCOPY     Patient Active Problem List   Diagnosis Date Noted   Cerebellar tumor ([place]) 11/15/2021   Comorbid sleep-related hypoventilation 10/19/2021   Urinary incontinence/areflexic bladder 10/05/2021   OSA, unable to tolerate CPAP (vomited every night) 09/05/2021   Chronic pain syndrome 09/05/2021   Chronic nausea 09/05/2021   Neurogenic bladder, followed by Urology, instructed I&O cath BID 09/05/2021   Intolerance of continuous positive airway pressure (CPAP) ventilation 09/03/2021   Insomnia secondary to chronic pain 07/24/2021   Primary osteoarthritis of right knee 07/03/2021   Myofascial pain  dysfunction syndrome 07/03/2021   Fibromyalgia 07/03/2021   Hyponatremia 07/03/2021   Primary osteoarthritis of left knee 07/03/2021   Body mass index (BMI) 50.0-59.9, adult ([place]) 03/07/2021   Allergic rhinitis due to animal (cat) (dog) hair and dander 10/06/2020   Allergic rhinitis due to pollen 10/06/2020   Moderate persistent asthma, uncomplicated 34/19/3790   Cerebellar mass 10/06/2020   Paresthesia 05/27/2020   Diabetes mellitus ([place]) 05/25/2020   Hypertension associated with diabetes ([place]) 05/25/2020   Hyperlipidemia associated with type 2 diabetes mellitus ([place]) 05/25/2020   Vitamin D deficiency 05/25/2020   NAFLD (nonalcoholic fatty liver disease)  05/25/2020   Sciatica of left side 05/25/2020    REFERRING DIAG: D49.6 (ICD-10-CM) - Neoplasm of unspecified behavior of brain    THERAPY DIAG:  Other lack of coordination  Other abnormalities of gait and mobility  Unsteadiness on feet  Muscle weakness (generalized)  History of falling  PERTINENT HISTORY: From H&P note on 11/15/2021:  "On the day of admission 11/07/2021, the patient was taken to the operating room by Dr. Recardo Evangelist of neurosurgery and underwent left suboccipital craniotomy, resection of cerebellar tumor. She tolerated the procedure well. Postoperative MRI performed on 3/22 revealed a small area of diffusion restriction in the left cerebellum."  Patient transferred to [place] on 11/15/2021, discharged 11/24/2021 per pt.  fibromyalgia, DM2, asthma, HTN, OSA, migraines, bilateral knee OA, myofascial pain syndrome    PRECAUTIONS: Fall  SUBJECTIVE: Pt states she went to the hospital for a bad headache and they changed her medicines a little which has not helped much.  She denies any falls or other issues at home.  She just got a reacher yesterday and plans to use this for laundry.  She states she has been using the rollator and she pushed herself to use it because it was easier for church.  PAIN:  She endorses same pain from prior session. Are you having pain? Yes: NPRS scale: 8.5/10 Pain location: All over (fibromyalgia), worse in head and neck and BLE (LLE worse)  - pain in a band like pattern around base and sides of head   OBJECTIVE:    DIAGNOSTIC FINDINGS: From Brain MRI on 09/02/2021:  "Increased size of left superior cerebellar vermis mass, now measuring 1.5 x 1.5 cm, previously 1.1 x 1.0 cm. Increased mild surrounding edema."  Future brain MRI ordered w/o date visible at this time.   COGNITION: Overall cognitive status: No family/caregiver present to determine baseline cognitive functioning - pt states "I feel stupid", she elaborates that she is having a hard time  processing.     TODAY'S TREATMENT:  Assessed 5xSTS: 12.28 sec w/ hands on knees vs 9.29 sec w/ arms across chest Assessed 10MWT: 8.47 sec w/ Rollator (normal speed) = 1.18 m/sec OR 3.90 ft/sec  vs 6.70 sec w/ rollator (pt running, cued to slow down for safety) Assessed TUG:  12.06 sec w/ rollator Assessed BERG:  [facility] PT Assessment - 02/19/22 0917       Berg Balance Test   Sit to Stand Able to stand without using hands and stabilize independently    Standing Unsupported Able to stand safely 2 minutes    Sitting with Back Unsupported but Feet Supported on Floor or Stool Able to sit safely and securely 2 minutes    Stand to Sit Sits safely with minimal use of hands    Transfers Able to transfer safely, minor use of hands    Standing Unsupported with Eyes Closed Able to stand 10 seconds  safely    Standing Unsupported with Feet Together Able to place feet together independently and stand 1 minute safely    From Standing, Reach Forward with Outstretched Arm Can reach forward >12 cm safely (5")    From Standing Position, Pick up Object from [place] to pick up shoe, needs supervision    From Standing Position, Turn to Look Behind Over each Shoulder Looks behind from both sides and weight shifts well    Turn 360 Degrees Able to turn 360 degrees safely but slowly    Standing Unsupported, Alternately Place Feet on Step/Stool Able to stand independently and complete 8 steps >20 seconds    Standing Unsupported, One Foot in [place] to take small step independently and hold 30 seconds    Standing on One Leg Tries to lift leg/unable to hold 3 seconds but remains standing independently    Total Score 46    Berg comment: Moderate Fall Risk              PATIENT EDUCATION: Education details: Plan for re-cert and to continue use of stepper, HEP, and walking program. Person educated: Patient Education method: Explanation  Education comprehension: verbalized understanding and needs further  education     HOME EXERCISE PROGRAM: Access Code: [email or web address] URL: [email or web address] Date: 01/16/2022 Prepared by: Elease Etienne  Exercises - Sit to Stand Without Arm Support  - 1 x daily - 7 x weekly - 3 sets - 10 reps - Standing March with Counter Support  - 1 x daily - 7 x weekly - 3 sets - 10 reps - Chin Tuck  - 1 x daily - 7 x weekly - 3 sets - 10 reps - Seated Cervical Sidebending Stretch  - 1 x daily - 7 x weekly - 3 sets - 10 reps - Supine Bridge  - 1 x daily - 4 x weekly - 3 sets - 10 reps - Supine Active Straight Leg Raise  - 1 x daily - 4 x weekly - 2 sets - 8 reps - Forward and Backward Monster Walk with Counter Support  - 1 x daily - 4 x weekly - 3 sets - 10 reps - Seated Heel Raise  - 1 x daily - 4 x weekly - 2 sets - 12 reps - Single Leg Stance on Pillow  - 1 x daily - 4 x weekly - 1 sets - 4 reps - Heel Raises with Counter Support  - 1 x daily - 4 x weekly - 2 sets - 8 reps - Sidelying ITB Stretch off Table  - 1 x daily - 4 x weekly - 1 sets - 2 reps - 45 seconds hold  Initiated walking program 01/30/2022:  Walk in home 2 mins at a time, 5x per day with the RW or rollator.    Seated stepper (pt states she has one that she would like to use) x8-10 minutes in chair with back support to maintain posture, progress time as able by increments of 1-2 minutes a week.  OLD GOALS: Goals reviewed with patient? Yes   SHORT TERM GOALS: Target date:  01/12/2022   Pt will be independent with initial strength and balance HEP with supervision from family. Baseline:  Established 12/06/2021, pt compliant intermittently Goal status: PARTIALLY MET   2.  Pt will decrease 5xSTS to <35 seconds w/ BUE support in order to demonstrate decreased risk for falls and improved functional bilateral LE strength and power. Baseline: 38.51sec w/ BUE; 21.94 sec w/ BUE  support Goal status: MET   3.  Pt will demonstrate a gait speed of >1 feet/sec in order to decrease risk for  falls. Baseline: 0.76 ft/sec;  3.03 ft/sec w/ RW Goal status: MET   4.  Pt will improve normal TUG to less than or equal to 30 seconds w/LRAD for improved functional mobility and decreased fall risk.  Baseline: 34.87s w/RW and S*; 18.91 seconds w/ RW and S* Goal status: MET   5.  Pt will improve Berg score to 41/56 for decreased fall risk  Baseline: 35/56 on 4/19; 38/56 Goal status: PARTIALLY MET   LONG TERM GOALS: Target date:  02/23/2022   Pt will report return to some form of aerobic activity with maintenance of advanced HEP for strength and balance. Baseline: Walking program and HEP estabished and pt states compliance. Goal status: MET   2.  Pt will decrease 5xSTS to <20 seconds w/ BUE support in order to demonstrate decreased risk for falls and improved functional bilateral LE strength and power. Baseline: 38.51 sec w/ BUE support; 21.94 seconds 01/09/2022; 12.28 sec w/ hands on knees Goal status:  MET   3.  Pt will demonstrate a gait speed of >3.3 feet/sec in order to decrease risk for falls. Baseline: 0.76 ft/sec; 3.03 ft/sec w/ RW; 3.90 ft/sec w/ rollator Goal status: MET   4.  Pt will improve normal TUG to less than or equal to 16 seconds w/LRAD for improved functional mobility and decreased fall risk.  Baseline: 34.87s w/RW and S*; 18.91s w/ RW and S*; 12.06 s w/ rollator Goal status: MET   5.  Pt will improve Berg score to 48/56 for decreased fall risk  Baseline: 35/56 on 4/19; 02/19/2022 46/56 Goal status: PARTIALLY MET   6.  Pt will tolerate >41mins of continuous activity in order to promote activity tolerance and endurance. Baseline: Pt extremely fatigued during session, nodding off intermittently.  02/19/2022 Pt able to sustain continuous goal assessment during session. Goal status: MET  UPDATED GOALS:  SHORT TERM GOALS:   Target date: 03/23/2022  Walking program will be progressed with pt compliant to promote aerobic gains and activity tolerance. Baseline: Initial  walking program established with pt compliant. Goal status: INITIAL  2.  Pt will increase BERG balance score to 50/56 to demonstrate improved static balance. Baseline: 46/56 Goal status: REVISED  3.  FGA to be assessed with STG/LTG set as appropriate. Baseline: Not assessed. Goal status: INITIAL  4.  PT to initiate stair training with pt able to negotiate 4 stairs using BUE support and step-to pattern to promote safe independence in home environment. Baseline: To be further assessed. Goal status: INITIAL  LONG TERM GOALS:  Target date: 04/20/2022  Pt will tolerate >/=8 minutes of continuous aerobic exercise to promote improved activity tolerance. Baseline: 5 minutes. Goal status: INITIAL  2.  Pt will ambulate >/=500' on various level and unlevel surfaces including ramped surface w/ LRAD and supervision to improve safe access to community environment. Baseline: limited to level surfaces w/ RW Goal status: INITIAL  3.  Pt will manage 8 stairs w/ unilateral rail at supervision level to promote safe independence in home and community environments. Baseline: To be further assessed.. Goal status: INITIAL  4.  FGA to be assessed with STG/LTG set as appropriate. Baseline: Not assessed. Goal status: INITIAL  ASSESSMENT:   CLINICAL IMPRESSION: Assessed LTGs this session with plan for re-cert.  Pt met or partially met 6 of 6 goals.  Her BERG improved to 46/56  2 points shy of goal level.  She is maintaining activity at home via HEP and walking program.  Her 5xSTS improved to 12.28 seconds w/ UE support and her gait speed progressed to 3.90 ft/sec.  She has progressed to walking with a rollator on a regular basis without issue.  TUG improved to 12.06 seconds w/ use of rollator.  Overall she tolerates various increased lengths of time exceeding 5 minutes of continuous activity throughout session.  Will continue to progress towards updated goals.  OBJECTIVE IMPAIRMENTS Abnormal gait, decreased  activity tolerance, decreased balance, decreased cognition, decreased coordination, decreased endurance, decreased knowledge of condition, decreased knowledge of use of DME, decreased mobility, difficulty walking, decreased ROM, decreased strength, and decreased safety awareness.    ACTIVITY LIMITATIONS cleaning, community activity, driving, meal prep, laundry, and shopping.    PERSONAL FACTORS Age, Behavior pattern, Fitness, and 3+ comorbidities: fibromyalgia, DM2, asthma, HTN, OSA, migraines, bilateral knee OA, myofascial pain syndrome  are also affecting patient's functional outcome.      REHAB POTENTIAL: Fair hx of fibromyalgia, DM2, asthma, HTN, OSA, migraines, bilateral knee OA, myofascial pain syndrome , personal factors   CLINICAL DECISION MAKING: Evolving/moderate complexity   EVALUATION COMPLEXITY: Moderate   PLAN: PT FREQUENCY: 1x/week   PT DURATION: 8 weeks (re-cert until 04/08/8137 due to scheduling)   PLANNED INTERVENTIONS: Therapeutic exercises, Therapeutic activity, Neuromuscular re-education, Balance training, Gait training, Patient/Family education, Joint mobilization, Stair training, Vestibular training, DME instructions, Manual therapy, and self care and home management   PLAN FOR NEXT SESSION:  Assess FGA and set STG/LTG.  Stair training. Delete old goals.  Use SciFit for endurance and strengthening w/ BUE/BLE.  Gait w/ rollator vs RW-for safety in home as pt uses in home intermittently.  Add to/revise HEP as needed.  Endurance, BLE strength, gait training w/ RW management, 6" step ups - dec UE support, quad strengthening, work on backwards walking, STS w/ overhead press     Bary Richard, PT, DPT 02/19/2022, 12:59 PM

## 2022-02-21 DIAGNOSIS — J3081 Allergic rhinitis due to animal (cat) (dog) hair and dander: Secondary | ICD-10-CM | POA: Diagnosis not present

## 2022-02-21 DIAGNOSIS — F329 Major depressive disorder, single episode, unspecified: Secondary | ICD-10-CM | POA: Diagnosis not present

## 2022-02-21 DIAGNOSIS — E1159 Type 2 diabetes mellitus with other circulatory complications: Secondary | ICD-10-CM | POA: Diagnosis not present

## 2022-02-21 DIAGNOSIS — J301 Allergic rhinitis due to pollen: Secondary | ICD-10-CM | POA: Diagnosis not present

## 2022-02-21 DIAGNOSIS — Z79899 Other long term (current) drug therapy: Secondary | ICD-10-CM | POA: Diagnosis not present

## 2022-02-21 DIAGNOSIS — J3089 Other allergic rhinitis: Secondary | ICD-10-CM | POA: Diagnosis not present

## 2022-03-02 ENCOUNTER — Ambulatory Visit: Payer: Medicare HMO | Admitting: Physical Therapy

## 2022-03-02 ENCOUNTER — Encounter: Payer: Self-pay | Admitting: Physical Therapy

## 2022-03-02 DIAGNOSIS — M6281 Muscle weakness (generalized): Secondary | ICD-10-CM | POA: Diagnosis not present

## 2022-03-02 DIAGNOSIS — Z9181 History of falling: Secondary | ICD-10-CM | POA: Diagnosis not present

## 2022-03-02 DIAGNOSIS — R2689 Other abnormalities of gait and mobility: Secondary | ICD-10-CM

## 2022-03-02 DIAGNOSIS — R278 Other lack of coordination: Secondary | ICD-10-CM

## 2022-03-02 DIAGNOSIS — R2681 Unsteadiness on feet: Secondary | ICD-10-CM | POA: Diagnosis not present

## 2022-03-02 DIAGNOSIS — J3089 Other allergic rhinitis: Secondary | ICD-10-CM | POA: Diagnosis not present

## 2022-03-02 DIAGNOSIS — J301 Allergic rhinitis due to pollen: Secondary | ICD-10-CM | POA: Diagnosis not present

## 2022-03-02 DIAGNOSIS — J3081 Allergic rhinitis due to animal (cat) (dog) hair and dander: Secondary | ICD-10-CM | POA: Diagnosis not present

## 2022-03-02 NOTE — Therapy (Signed)
OUTPATIENT PHYSICAL THERAPY TREATMENT NOTE   Patient Name: ASENCION GUISINGER MRN: [id number] DOB:[birth year], 61 y.o., female Today's Date: 03/02/2022  PCP: Shirline Frees, MD REFERRING PROVIDER: Courtney Heys, MD  END OF SESSION:   PT End of Session - 03/02/22 1111     Visit Number 13    Number of Visits 21    Date for PT Re-Evaluation 05/04/22   pushed out for scheduling   Authorization Type [employer]    Progress Note Due on Visit 20    PT Start Time 1105    PT Stop Time 1150    PT Time Calculation (min) 45 min    Equipment Utilized During Treatment Gait belt    Activity Tolerance Patient tolerated treatment well    Behavior During Therapy WFL for tasks assessed/performed              Past Medical History:  Diagnosis Date   Allergies    Anemia    Arthritis    Asthma    Back pain    Chronic pain    Complication of anesthesia    woke up during colonoscopy   Diabetes ([place])    Diabetes mellitus without complication (HCC)    Edema, lower extremity    Fibroid    Fibromyalgia    Chronic   GERD (gastroesophageal reflux disease)    Headache    migraines   High blood pressure    History of hiatal hernia    History of stomach ulcers    IBS (irritable bowel syndrome)    Joint pain    Sleep apnea    did use cpap-lost 25lb-says she does not need it   NO CPAP   Wears contact lenses    Past Surgical History:  Procedure Laterality Date   BRAIN SURGERY Left 11/07/2021   [facility] Dr. Herschel [place]   BREAST BIOPSY     BREAST EXCISIONAL BIOPSY     BREAST LUMPECTOMY WITH RADIOACTIVE SEED LOCALIZATION Bilateral 11/24/2014   Procedure: BILATERAL BREAST LUMPECTOMY WITH RADIOACTIVE SEED LOCALIZATION;  Surgeon: [name], MD;  Location: [place];  Service: General;  Laterality: Bilateral;   CHOLECYSTECTOMY     COLONOSCOPY     DILATION AND CURETTAGE OF UTERUS     ESOPHAGOGASTRODUODENOSCOPY (EGD) WITH PROPOFOL N/A 07/23/2016   Procedure:  ESOPHAGOGASTRODUODENOSCOPY (EGD) WITH PROPOFOL;  Surgeon: Laurence Spates, MD;  Location: [facility];  Service: Endoscopy;  Laterality: N/A;   ESOPHAGOGASTRODUODENOSCOPY (EGD) WITH PROPOFOL N/A 06/11/2018   Procedure: ESOPHAGOGASTRODUODENOSCOPY (EGD) WITH PROPOFOL;  Surgeon: Laurence Spates, MD;  Location: [facility];  Service: Endoscopy;  Laterality: N/A;   LUMBAR LAMINECTOMY  2010   ORIF WRIST FRACTURE Right 11/24/2020   Procedure: OPEN REDUCTION INTERNAL FIXATION RIGHT WRIST FRACTURE;  Surgeon: Renette Butters, MD;  Location: [facility];  Service: Orthopedics;  Laterality: Right;   UMBILICAL HERNIA REPAIR     age 86   UPPER GI ENDOSCOPY     Patient Active Problem List   Diagnosis Date Noted   Cerebellar tumor ([place]) 11/15/2021   Comorbid sleep-related hypoventilation 10/19/2021   Urinary incontinence/areflexic bladder 10/05/2021   OSA, unable to tolerate CPAP (vomited every night) 09/05/2021   Chronic pain syndrome 09/05/2021   Chronic nausea 09/05/2021   Neurogenic bladder, followed by Urology, instructed I&O cath BID 09/05/2021   Intolerance of continuous positive airway pressure (CPAP) ventilation 09/03/2021   Insomnia secondary to chronic pain 07/24/2021   Primary osteoarthritis of right knee 07/03/2021   Myofascial pain  dysfunction syndrome 07/03/2021   Fibromyalgia 07/03/2021   Hyponatremia 07/03/2021   Primary osteoarthritis of left knee 07/03/2021   Body mass index (BMI) 50.0-59.9, adult ([place]) 03/07/2021   Allergic rhinitis due to animal (cat) (dog) hair and dander 10/06/2020   Allergic rhinitis due to pollen 10/06/2020   Moderate persistent asthma, uncomplicated 93/71/6967   Cerebellar mass 10/06/2020   Paresthesia 05/27/2020   Diabetes mellitus ([place]) 05/25/2020   Hypertension associated with diabetes ([place]) 05/25/2020   Hyperlipidemia associated with type 2 diabetes mellitus ([place]) 05/25/2020   Vitamin D deficiency 05/25/2020   NAFLD (nonalcoholic fatty liver disease)  05/25/2020   Sciatica of left side 05/25/2020    REFERRING DIAG: D49.6 (ICD-10-CM) - Neoplasm of unspecified behavior of brain    THERAPY DIAG:  Other abnormalities of gait and mobility  Unsteadiness on feet  Muscle weakness (generalized)  Other lack of coordination  History of falling  PERTINENT HISTORY: From H&P note on 11/15/2021:  "On the day of admission 11/07/2021, the patient was taken to the operating room by Dr. Recardo Evangelist of neurosurgery and underwent left suboccipital craniotomy, resection of cerebellar tumor. She tolerated the procedure well. Postoperative MRI performed on 3/22 revealed a small area of diffusion restriction in the left cerebellum."  Patient transferred to [place] on 11/15/2021, discharged 11/24/2021 per pt.  fibromyalgia, DM2, asthma, HTN, OSA, migraines, bilateral knee OA, myofascial pain syndrome    PRECAUTIONS: Fall  SUBJECTIVE: She was able to get herself in and out of the tub without help, down stairs at church without help (supervision), and did a small load of laundry and hung the clothes up to dry.  She has not fallen recently.  She remains compliant to HEP and walking program.  Pt states she has new glasses.  She has been doing her hair and other self-care things with increased time.  PAIN:  She endorses same pain from prior. Are you having pain? Yes: NPRS scale: 8.5/10 Pain location: All over (fibromyalgia), worse in head and neck and BLE (LLE worse)  - pain in a band like pattern around base and sides of head   OBJECTIVE:    DIAGNOSTIC FINDINGS: From Brain MRI on 09/02/2021:  "Increased size of left superior cerebellar vermis mass, now measuring 1.5 x 1.5 cm, previously 1.1 x 1.0 cm. Increased mild surrounding edema."  Future brain MRI ordered w/o date visible at this time.   COGNITION: Overall cognitive status: No family/caregiver present to determine baseline cognitive functioning - pt states "I feel stupid", she elaborates that she is having a  hard time processing.     TODAY'S TREATMENT:  -Assessed FGA:  [facility] PT Assessment - 03/02/22 1121       Functional Gait  Assessment   Gait assessed  Yes    Gait Level Surface Walks 20 ft, slow speed, abnormal gait pattern, evidence for imbalance or deviates 10-15 in outside of the 12 in walkway width. Requires more than 7 sec to ambulate 20 ft.    Change in Gait Speed Makes only minor adjustments to walking speed, or accomplishes a change in speed with significant gait deviations, deviates 10-15 in outside the 12 in walkway width, or changes speed but loses balance but is able to recover and continue walking.    Gait with Horizontal Head Turns Performs head turns with moderate changes in gait velocity, slows down, deviates 10-15 in outside 12 in walkway width but recovers, can continue to walk.    Gait with Vertical Head Turns Performs task  with moderate change in gait velocity, slows down, deviates 10-15 in outside 12 in walkway width but recovers, can continue to walk.    Gait and Pivot Turn Turns slowly, requires verbal cueing, or requires several small steps to catch balance following turn and stop    Step Over Obstacle Is able to step over one shoe box (4.5 in total height) but must slow down and adjust steps to clear box safely. May require verbal cueing.    Gait with Narrow Base of Support Ambulates less than 4 steps heel to toe or cannot perform without assistance.    Gait with Eyes Closed Walks 20 ft, uses assistive device, slower speed, mild gait deviations, deviates 6-10 in outside 12 in walkway width. Ambulates 20 ft in less than 9 sec but greater than 7 sec.    Ambulating Backwards Walks 20 ft, slow speed, abnormal gait pattern, evidence for imbalance, deviates 10-15 in outside 12 in walkway width.    Steps Alternating feet, must use rail.   bilateral rails   Total Score 11    FGA comment: Significant Fall Risk           Pt requires rest throughout  assessment.  STAIRS:  Level of Assistance: SBA  Stair Negotiation Technique: Step to Pattern Alternating Pattern  Forwards with Single Rail on Right Single Rail on Left Bilateral Rails  Number of Stairs: 16   Height of Stairs: 6"  Comments: Trialed use of LUE during descent with cues to use step-to gait pattern.  Pt able to ascend consistently with reciprocal gait regardless of BUE or Unilateral support.    GAIT: Gait pattern: step through pattern, decreased stride length, decreased hip/knee flexion- Left, decreased ankle dorsiflexion- Right, trendelenburg, trunk flexed, and wide BOS Distance walked: various clinic distances Assistive device utilized: None Level of assistance: CGA Comments: Pt able to ambulate a max of 230' at a time without AD today during assessments.  She demonstrates bilateral lean during stance phase.  SciFit L2.5 x8.5 minutes using BUE/BLE with cues for inc amplitude of movement to encourage reciprocal motion, generalized strengthening, and endurance.    PATIENT EDUCATION: Education details: Continue HEP and walking program. Person educated: Patient Education method: Explanation  Education comprehension: verbalized understanding and needs further education     HOME EXERCISE PROGRAM: Access Code: [email or web address] URL: [email or web address] Date: 01/16/2022 Prepared by: Elease Etienne  Exercises - Sit to Stand Without Arm Support  - 1 x daily - 7 x weekly - 3 sets - 10 reps - Standing March with Counter Support  - 1 x daily - 7 x weekly - 3 sets - 10 reps - Chin Tuck  - 1 x daily - 7 x weekly - 3 sets - 10 reps - Seated Cervical Sidebending Stretch  - 1 x daily - 7 x weekly - 3 sets - 10 reps - Supine Bridge  - 1 x daily - 4 x weekly - 3 sets - 10 reps - Supine Active Straight Leg Raise  - 1 x daily - 4 x weekly - 2 sets - 8 reps - Forward and Backward Monster Walk with Counter Support  - 1 x daily - 4 x weekly - 3 sets - 10 reps - Seated Heel  Raise  - 1 x daily - 4 x weekly - 2 sets - 12 reps - Single Leg Stance on Pillow  - 1 x daily - 4 x weekly - 1 sets - 4 reps - Heel Raises with Counter Support  -  1 x daily - 4 x weekly - 2 sets - 8 reps - Sidelying ITB Stretch off Table  - 1 x daily - 4 x weekly - 1 sets - 2 reps - 45 seconds hold  Initiated walking program 01/30/2022:  Walk in home 2 mins at a time, 5x per day with the RW or rollator.    Seated stepper (pt states she has one that she would like to use) x8-10 minutes in chair with back support to maintain posture, progress time as able by increments of 1-2 minutes a week.  GOALS:  SHORT TERM GOALS:   Target date: 03/23/2022  Walking program will be progressed with pt compliant to promote aerobic gains and activity tolerance. Baseline: Initial walking program established with pt compliant. Goal status: INITIAL  2.  Pt will increase BERG balance score to 50/56 to demonstrate improved static balance. Baseline: 46/56 Goal status: REVISED  3.  Pt will improve FGA score to >/=15/30 in order to demonstrate improved balance and decreased fall risk. Baseline: 11/30 Goal status: INITIAL  4.  PT to initiate stair training with pt able to negotiate 4 stairs using BUE support and step-to pattern to promote safe independence in home environment. Baseline: To be further assessed. Goal status: INITIAL  LONG TERM GOALS:  Target date: 04/20/2022  Pt will tolerate >/=8 minutes of continuous aerobic exercise to promote improved activity tolerance. Baseline: 5 minutes. Goal status: INITIAL  2.  Pt will ambulate >/=500' on various level and unlevel surfaces including ramped surface w/ LRAD and supervision to improve safe access to community environment. Baseline: limited to level surfaces w/ RW Goal status: INITIAL  3.  Pt will manage 8 stairs w/ unilateral rail at supervision level to promote safe independence in home and community environments. Baseline: To be further assessed.. Goal  status: INITIAL  4.  Pt will improve FGA score to >/=20/30 in order to demonstrate improved balance and decreased fall risk. Baseline: 11/30 Goal status: INITIAL  ASSESSMENT:   CLINICAL IMPRESSION: Focus of skilled session on assessment of FGA and promotion of activity tolerance.  Pt scores 11/30 on FGA today illustrating a significant fall risk.  She is most limited by narrowed BOS specifically in tandem, head movements, and changing gait speed appropriately.  Her ability to perform stairs safely is improving with use of at least one rail.  Progressed stair training to using LUE which is patient's weaker UE to descend stairs using rail with a step-to gait pattern.  She is progressing towards LTGs well.  OBJECTIVE IMPAIRMENTS Abnormal gait, decreased activity tolerance, decreased balance, decreased cognition, decreased coordination, decreased endurance, decreased knowledge of condition, decreased knowledge of use of DME, decreased mobility, difficulty walking, decreased ROM, decreased strength, and decreased safety awareness.    ACTIVITY LIMITATIONS cleaning, community activity, driving, meal prep, laundry, and shopping.    PERSONAL FACTORS Age, Behavior pattern, Fitness, and 3+ comorbidities: fibromyalgia, DM2, asthma, HTN, OSA, migraines, bilateral knee OA, myofascial pain syndrome  are also affecting patient's functional outcome.      REHAB POTENTIAL: Fair hx of fibromyalgia, DM2, asthma, HTN, OSA, migraines, bilateral knee OA, myofascial pain syndrome , personal factors   CLINICAL DECISION MAKING: Evolving/moderate complexity   EVALUATION COMPLEXITY: Moderate   PLAN: PT FREQUENCY: 1x/week   PT DURATION: 8 weeks (re-cert until 5/78/4696 due to scheduling)   PLANNED INTERVENTIONS: Therapeutic exercises, Therapeutic activity, Neuromuscular re-education, Balance training, Gait training, Patient/Family education, Joint mobilization, Stair training, Vestibular training, DME instructions,  Manual therapy, and  self care and home management   PLAN FOR NEXT SESSION:  Continue stair training-reciprocal descending w/ LUE (weaker UE).  Use SciFit for endurance and strengthening w/ BUE/BLE.  Gait w/ rollator vs RW-for safety in home as pt uses in home intermittently.  Add to/revise HEP as needed.  Endurance, BLE strength, gait training w/ RW management, 6" step ups - dec UE support, quad strengthening, work on backwards walking, STS w/ overhead press, circuit training-strength and balance.     Bary Richard, PT, DPT 03/02/2022, 1:13 PM

## 2022-03-07 DIAGNOSIS — E118 Type 2 diabetes mellitus with unspecified complications: Secondary | ICD-10-CM | POA: Diagnosis not present

## 2022-03-07 DIAGNOSIS — J3089 Other allergic rhinitis: Secondary | ICD-10-CM | POA: Diagnosis not present

## 2022-03-07 DIAGNOSIS — J3081 Allergic rhinitis due to animal (cat) (dog) hair and dander: Secondary | ICD-10-CM | POA: Diagnosis not present

## 2022-03-07 DIAGNOSIS — I10 Essential (primary) hypertension: Secondary | ICD-10-CM | POA: Diagnosis not present

## 2022-03-07 DIAGNOSIS — K219 Gastro-esophageal reflux disease without esophagitis: Secondary | ICD-10-CM | POA: Diagnosis not present

## 2022-03-07 DIAGNOSIS — E1169 Type 2 diabetes mellitus with other specified complication: Secondary | ICD-10-CM | POA: Diagnosis not present

## 2022-03-07 DIAGNOSIS — J301 Allergic rhinitis due to pollen: Secondary | ICD-10-CM | POA: Diagnosis not present

## 2022-03-09 ENCOUNTER — Ambulatory Visit: Payer: Medicare HMO | Admitting: Physical Therapy

## 2022-03-09 ENCOUNTER — Encounter: Payer: Self-pay | Admitting: Physical Therapy

## 2022-03-09 ENCOUNTER — Ambulatory Visit: Payer: Medicare HMO | Admitting: Neurology

## 2022-03-09 VITALS — BP 143/81 | HR 92

## 2022-03-09 DIAGNOSIS — R2689 Other abnormalities of gait and mobility: Secondary | ICD-10-CM

## 2022-03-09 DIAGNOSIS — R278 Other lack of coordination: Secondary | ICD-10-CM | POA: Diagnosis not present

## 2022-03-09 DIAGNOSIS — Z9181 History of falling: Secondary | ICD-10-CM | POA: Diagnosis not present

## 2022-03-09 DIAGNOSIS — G43709 Chronic migraine without aura, not intractable, without status migrainosus: Secondary | ICD-10-CM | POA: Diagnosis not present

## 2022-03-09 DIAGNOSIS — M6281 Muscle weakness (generalized): Secondary | ICD-10-CM | POA: Diagnosis not present

## 2022-03-09 DIAGNOSIS — R2681 Unsteadiness on feet: Secondary | ICD-10-CM | POA: Diagnosis not present

## 2022-03-09 MED ORDER — ONABOTULINUMTOXINA 100 UNITS IJ SOLR
200.0000 [IU] | Freq: Once | INTRAMUSCULAR | Status: AC
  Administered 2022-03-09: 155 [IU] via INTRAMUSCULAR

## 2022-03-09 NOTE — Progress Notes (Signed)
[facility]   Procedure Note Botox  Attending: Dr. Metta Clines  Preoperative Diagnosis(es): Chronic migraine  Consent obtained from: {PMR Consent:[id number]} Benefits discussed included, but were not limited to decreased muscle tightness, increased joint range of motion, and decreased pain.  Risk discussed included, but were not limited pain and discomfort, bleeding, bruising, excessive weakness, venous thrombosis, muscle atrophy and dysphagia.  Anticipated outcomes of the procedure as well as he risks and benefits of the alternatives to the procedure, and the roles and tasks of the personnel to be involved, were discussed with the patient, and the patient consents to the procedure and agrees to proceed. A copy of the patient medication guide was given to the patient which explains the blackbox warning.  Patients identity and treatment sites confirmed Yes.  .  Details of Procedure: Skin was cleaned with alcohol. Prior to injection, the needle plunger was aspirated to make sure the needle was not within a blood vessel.  There was no blood retrieved on aspiration.    Following is a summary of the muscles injected  And the amount of Botulinum toxin used:  Dilution 200 units of Botox was reconstituted with 4 ml of preservative free normal saline. Time of reconstitution: At the time of the office visit (<30 minutes prior to injection)   Injections  155 total units of Botox was injected with a 30 gauge needle.  Injection Sites: L occipitalis: 15 units- 3 sites  R occiptalis: 15 units- 3 sites  L upper trapezius: 15 units- 3 sites R upper trapezius: 15 units- 3 sits          L paraspinal: 10 units- 2 sites R paraspinal: 10 units- 2 sites  Face L frontalis(2 injection sites):10 units   R frontalis(2 injection sites):10 units         L corrugator: 5 units   R corrugator: 5 units           Procerus: 5 units   L temporalis: 20 units R temporalis: 20 units   Agent:  200 units of  botulinum Type A (Onobotulinum Toxin type A) was reconstituted with 4 ml of preservative free normal saline.  Time of reconstitution: At the time of the office visit (<30 minutes prior to injection)     Total injected (Units):  155  Total wasted (Units): {PMR Numbers 10-100:[id number]}  Patient tolerated procedure well without complications.   Reinjection is anticipated in 3 months.

## 2022-03-09 NOTE — Therapy (Signed)
OUTPATIENT PHYSICAL THERAPY TREATMENT NOTE   Patient Name: Virginia Luna MRN: [id number] DOB:February 06, 1961, 61 y.o., female Today's Date: 03/09/2022  PCP: Shirline Frees, MD REFERRING PROVIDER: Courtney Heys, MD  END OF SESSION:   PT End of Session - 03/09/22 0851     Visit Number 14    Number of Visits 21    Date for PT Re-Evaluation 05/04/22   pushed out for scheduling   Authorization Type [employer]    Progress Note Due on Visit 20    PT Start Time 0845    PT Stop Time 0937    PT Time Calculation (min) 52 min    Equipment Utilized During Treatment Gait belt    Activity Tolerance Patient tolerated treatment well    Behavior During Therapy WFL for tasks assessed/performed              Past Medical History:  Diagnosis Date   Allergies    Anemia    Arthritis    Asthma    Back pain    Chronic pain    Complication of anesthesia    woke up during colonoscopy   Diabetes ([place])    Diabetes mellitus without complication (HCC)    Edema, lower extremity    Fibroid    Fibromyalgia    Chronic   GERD (gastroesophageal reflux disease)    Headache    migraines   High blood pressure    History of hiatal hernia    History of stomach ulcers    IBS (irritable bowel syndrome)    Joint pain    Sleep apnea    did use cpap-lost 25lb-says she does not need it   NO CPAP   Wears contact lenses    Past Surgical History:  Procedure Laterality Date   BRAIN SURGERY Left 11/07/2021   [facility] Dr. Herschel [place]   BREAST BIOPSY     BREAST EXCISIONAL BIOPSY     BREAST LUMPECTOMY WITH RADIOACTIVE SEED LOCALIZATION Bilateral 11/24/2014   Procedure: BILATERAL BREAST LUMPECTOMY WITH RADIOACTIVE SEED LOCALIZATION;  Surgeon: [name], MD;  Location: [place];  Service: General;  Laterality: Bilateral;   CHOLECYSTECTOMY     COLONOSCOPY     DILATION AND CURETTAGE OF UTERUS     ESOPHAGOGASTRODUODENOSCOPY (EGD) WITH PROPOFOL N/A 07/23/2016   Procedure:  ESOPHAGOGASTRODUODENOSCOPY (EGD) WITH PROPOFOL;  Surgeon: Laurence Spates, MD;  Location: [facility];  Service: Endoscopy;  Laterality: N/A;   ESOPHAGOGASTRODUODENOSCOPY (EGD) WITH PROPOFOL N/A 06/11/2018   Procedure: ESOPHAGOGASTRODUODENOSCOPY (EGD) WITH PROPOFOL;  Surgeon: Laurence Spates, MD;  Location: [facility];  Service: Endoscopy;  Laterality: N/A;   LUMBAR LAMINECTOMY  2010   ORIF WRIST FRACTURE Right 11/24/2020   Procedure: OPEN REDUCTION INTERNAL FIXATION RIGHT WRIST FRACTURE;  Surgeon: Renette Butters, MD;  Location: [facility];  Service: Orthopedics;  Laterality: Right;   UMBILICAL HERNIA REPAIR     age 61   UPPER GI ENDOSCOPY     Patient Active Problem List   Diagnosis Date Noted   Cerebellar tumor ([place]) 11/15/2021   Comorbid sleep-related hypoventilation 10/19/2021   Urinary incontinence/areflexic bladder 10/05/2021   OSA, unable to tolerate CPAP (vomited every night) 09/05/2021   Chronic pain syndrome 09/05/2021   Chronic nausea 09/05/2021   Neurogenic bladder, followed by Urology, instructed I&O cath BID 09/05/2021   Intolerance of continuous positive airway pressure (CPAP) ventilation 09/03/2021   Insomnia secondary to chronic pain 07/24/2021   Primary osteoarthritis of right knee 07/03/2021   Myofascial pain  dysfunction syndrome 07/03/2021   Fibromyalgia 07/03/2021   Hyponatremia 07/03/2021   Primary osteoarthritis of left knee 07/03/2021   Body mass index (BMI) 50.0-59.9, adult ([place]) 03/07/2021   Allergic rhinitis due to animal (cat) (dog) hair and dander 10/06/2020   Allergic rhinitis due to pollen 10/06/2020   Moderate persistent asthma, uncomplicated 89/16/9450   Cerebellar mass 10/06/2020   Paresthesia 05/27/2020   Diabetes mellitus ([place]) 05/25/2020   Hypertension associated with diabetes ([place]) 05/25/2020   Hyperlipidemia associated with type 2 diabetes mellitus ([place]) 05/25/2020   Vitamin D deficiency 05/25/2020   NAFLD (nonalcoholic fatty liver disease)  05/25/2020   Sciatica of left side 05/25/2020    REFERRING DIAG: D49.6 (ICD-10-CM) - Neoplasm of unspecified behavior of brain    THERAPY DIAG:  Other abnormalities of gait and mobility  Unsteadiness on feet  Muscle weakness (generalized)  Other lack of coordination  PERTINENT HISTORY: From H&P note on 11/15/2021:  "On the day of admission 11/07/2021, the patient was taken to the operating room by Dr. Recardo Evangelist of neurosurgery and underwent left suboccipital craniotomy, resection of cerebellar tumor. She tolerated the procedure well. Postoperative MRI performed on 3/22 revealed a small area of diffusion restriction in the left cerebellum."  Patient transferred to [place] on 11/15/2021, discharged 11/24/2021 per pt.  fibromyalgia, DM2, asthma, HTN, OSA, migraines, bilateral knee OA, myofascial pain syndrome    PRECAUTIONS: Fall  SUBJECTIVE: "I don't feel bad, I just don't feel as great."  She tried to eat a yogurt sundae this morning and dropped it because she feels shaky and her legs feel weaker.  PAIN:  She endorses same pain from prior. Are you having pain? Yes: NPRS scale: 8.5/10 Pain location: All over (fibromyalgia), worse in head and neck and BLE (LLE worse)  - pain in a band like pattern around base and sides of head  VITALS (LUE): Today's Vitals   03/09/22 0848  BP: (!) 143/81  Pulse: 92   OBJECTIVE:    DIAGNOSTIC FINDINGS: From Brain MRI on 09/02/2021:  "Increased size of left superior cerebellar vermis mass, now measuring 1.5 x 1.5 cm, previously 1.1 x 1.0 cm. Increased mild surrounding edema."  Future brain MRI ordered w/o date visible at this time.   COGNITION: Overall cognitive status: No family/caregiver present to determine baseline cognitive functioning - pt states "I feel stupid", she elaborates that she is having a hard time processing.     TODAY'S TREATMENT:   STAIRS:  Level of Assistance: SBA and CGA  Stair Negotiation Technique: Alternating Pattern   Forwards with Single Rail on Left  Number of Stairs: 12   Height of Stairs: 6"  Comments: Pt demonstrates inc lean onto left rail for support throughout.  Better bilateral quad engagement during descent this visit.  Pt becomes labile mid session about current state of mental fog/memory at home and difficulty with certain relationships due to this.  PT provides therapeutic listening and encouragement as appropriate with redirection to task as patient is willing and able to continue session.  Circuit completed x4:  horizontal shoulder abduction w/ scap squeeze using red theraband x10 > STS w/ 5# kettlebell from standard chair x10 > forward reciprocal stepping in ladder > backwards stepping 2 feet to a rung in ladder > Left and right lateral stepping in ladder  SciFit x6 mins L2.0 w/ BLE only for strengthening and cardiovascular endurance.  Pt instructed to find a pace that she could maintain throughout, maintained steps/minute b/w 95-110.  PATIENT EDUCATION: Education  details: Continue HEP and walking program. Person educated: Patient Education method: Explanation  Education comprehension: verbalized understanding and needs further education     HOME EXERCISE PROGRAM: Access Code: [email or web address] URL: [email or web address] Date: 01/16/2022 Prepared by: Elease Etienne  Exercises - Sit to Stand Without Arm Support  - 1 x daily - 7 x weekly - 3 sets - 10 reps - Standing March with Counter Support  - 1 x daily - 7 x weekly - 3 sets - 10 reps - Chin Tuck  - 1 x daily - 7 x weekly - 3 sets - 10 reps - Seated Cervical Sidebending Stretch  - 1 x daily - 7 x weekly - 3 sets - 10 reps - Supine Bridge  - 1 x daily - 4 x weekly - 3 sets - 10 reps - Supine Active Straight Leg Raise  - 1 x daily - 4 x weekly - 2 sets - 8 reps - Forward and Backward Monster Walk with Counter Support  - 1 x daily - 4 x weekly - 3 sets - 10 reps - Seated Heel Raise  - 1 x daily - 4 x weekly - 2 sets - 12  reps - Single Leg Stance on Pillow  - 1 x daily - 4 x weekly - 1 sets - 4 reps - Heel Raises with Counter Support  - 1 x daily - 4 x weekly - 2 sets - 8 reps - Sidelying ITB Stretch off Table  - 1 x daily - 4 x weekly - 1 sets - 2 reps - 45 seconds hold  Initiated walking program 01/30/2022:  Walk in home 2 mins at a time, 5x per day with the RW or rollator.    Seated stepper (pt states she has one that she would like to use) x8-10 minutes in chair with back support to maintain posture, progress time as able by increments of 1-2 minutes a week.  GOALS:  SHORT TERM GOALS:   Target date: 03/23/2022  Walking program will be progressed with pt compliant to promote aerobic gains and activity tolerance. Baseline: Initial walking program established with pt compliant. Goal status: INITIAL  2.  Pt will increase BERG balance score to 50/56 to demonstrate improved static balance. Baseline: 46/56 Goal status: REVISED  3.  Pt will improve FGA score to >/=15/30 in order to demonstrate improved balance and decreased fall risk. Baseline: 11/30 Goal status: INITIAL  4.  PT to initiate stair training with pt able to negotiate 4 stairs using BUE support and step-to pattern to promote safe independence in home environment. Baseline: To be further assessed. Goal status: INITIAL  LONG TERM GOALS:  Target date: 04/20/2022  Pt will tolerate >/=8 minutes of continuous aerobic exercise to promote improved activity tolerance. Baseline: 5 minutes. Goal status: INITIAL  2.  Pt will ambulate >/=500' on various level and unlevel surfaces including ramped surface w/ LRAD and supervision to improve safe access to community environment. Baseline: limited to level surfaces w/ RW Goal status: INITIAL  3.  Pt will manage 8 stairs w/ unilateral rail at supervision level to promote safe independence in home and community environments. Baseline: To be further assessed.. Goal status: INITIAL  4.  Pt will improve FGA  score to >/=20/30 in order to demonstrate improved balance and decreased fall risk. Baseline: 11/30 Goal status: INITIAL  ASSESSMENT:   CLINICAL IMPRESSION: Initiated session with stair training today with patient using increased support from rail without LOB.  She demonstrated improved quad  control primarily on descent of stairs.  Progressed to circuit training structured with postural and functional strength tasks and dynamic balance tasks with focus on continuous movement for endurance.  Pt would do well to continue working on hip strength, dynamic balance when retro-stepping and she could potentially benefit from progression of endurance activities to treadmill training.  Will continue to progress towards LTGs as able.  OBJECTIVE IMPAIRMENTS Abnormal gait, decreased activity tolerance, decreased balance, decreased cognition, decreased coordination, decreased endurance, decreased knowledge of condition, decreased knowledge of use of DME, decreased mobility, difficulty walking, decreased ROM, decreased strength, and decreased safety awareness.    ACTIVITY LIMITATIONS cleaning, community activity, driving, meal prep, laundry, and shopping.    PERSONAL FACTORS Age, Behavior pattern, Fitness, and 3+ comorbidities: fibromyalgia, DM2, asthma, HTN, OSA, migraines, bilateral knee OA, myofascial pain syndrome  are also affecting patient's functional outcome.      REHAB POTENTIAL: Fair hx of fibromyalgia, DM2, asthma, HTN, OSA, migraines, bilateral knee OA, myofascial pain syndrome , personal factors   CLINICAL DECISION MAKING: Evolving/moderate complexity   EVALUATION COMPLEXITY: Moderate   PLAN: PT FREQUENCY: 1x/week   PT DURATION: 8 weeks (re-cert until 5/39/7673 due to scheduling)   PLANNED INTERVENTIONS: Therapeutic exercises, Therapeutic activity, Neuromuscular re-education, Balance training, Gait training, Patient/Family education, Joint mobilization, Stair training, Vestibular training,  DME instructions, Manual therapy, and self care and home management   PLAN FOR NEXT SESSION:  Outdoor ambulation w/ and w/o rollator, balance.  SciFit.  Gait w/ rollator vs w/o.  Modify HEP prn.  Endurance, BLE strength-quad strengthening, work on backwards walking w/o support, STS w/ overhead press/10# kettlebell hold, circuit training-balance.     Bary Richard, PT, DPT 03/09/2022, 10:00 AM

## 2022-03-14 ENCOUNTER — Other Ambulatory Visit: Payer: Self-pay | Admitting: Neurology

## 2022-03-15 DIAGNOSIS — G894 Chronic pain syndrome: Secondary | ICD-10-CM | POA: Diagnosis not present

## 2022-03-15 DIAGNOSIS — G43909 Migraine, unspecified, not intractable, without status migrainosus: Secondary | ICD-10-CM | POA: Diagnosis not present

## 2022-03-15 DIAGNOSIS — E118 Type 2 diabetes mellitus with unspecified complications: Secondary | ICD-10-CM | POA: Diagnosis not present

## 2022-03-15 DIAGNOSIS — J3081 Allergic rhinitis due to animal (cat) (dog) hair and dander: Secondary | ICD-10-CM | POA: Diagnosis not present

## 2022-03-15 DIAGNOSIS — J301 Allergic rhinitis due to pollen: Secondary | ICD-10-CM | POA: Diagnosis not present

## 2022-03-15 DIAGNOSIS — J4540 Moderate persistent asthma, uncomplicated: Secondary | ICD-10-CM | POA: Diagnosis not present

## 2022-03-15 DIAGNOSIS — K219 Gastro-esophageal reflux disease without esophagitis: Secondary | ICD-10-CM | POA: Diagnosis not present

## 2022-03-15 DIAGNOSIS — I10 Essential (primary) hypertension: Secondary | ICD-10-CM | POA: Diagnosis not present

## 2022-03-15 DIAGNOSIS — E7800 Pure hypercholesterolemia, unspecified: Secondary | ICD-10-CM | POA: Diagnosis not present

## 2022-03-15 DIAGNOSIS — J3089 Other allergic rhinitis: Secondary | ICD-10-CM | POA: Diagnosis not present

## 2022-03-15 DIAGNOSIS — R42 Dizziness and giddiness: Secondary | ICD-10-CM | POA: Diagnosis not present

## 2022-03-15 DIAGNOSIS — K5903 Drug induced constipation: Secondary | ICD-10-CM | POA: Diagnosis not present

## 2022-03-16 ENCOUNTER — Ambulatory Visit: Payer: Medicare HMO | Admitting: Physical Therapy

## 2022-03-16 ENCOUNTER — Encounter: Payer: Self-pay | Admitting: Physical Therapy

## 2022-03-16 VITALS — BP 149/85 | HR 90

## 2022-03-16 DIAGNOSIS — M6281 Muscle weakness (generalized): Secondary | ICD-10-CM | POA: Diagnosis not present

## 2022-03-16 DIAGNOSIS — R2689 Other abnormalities of gait and mobility: Secondary | ICD-10-CM | POA: Diagnosis not present

## 2022-03-16 DIAGNOSIS — Z9181 History of falling: Secondary | ICD-10-CM

## 2022-03-16 DIAGNOSIS — R278 Other lack of coordination: Secondary | ICD-10-CM | POA: Diagnosis not present

## 2022-03-16 DIAGNOSIS — R2681 Unsteadiness on feet: Secondary | ICD-10-CM | POA: Diagnosis not present

## 2022-03-16 NOTE — Therapy (Signed)
OUTPATIENT PHYSICAL THERAPY TREATMENT NOTE   Patient Name: Virginia Luna MRN: [id number] DOB:15-Feb-1961, 61 y.o., female Today's Date: 03/16/2022  PCP: Shirline Frees, MD REFERRING PROVIDER: Courtney Heys, MD  END OF SESSION:   PT End of Session - 03/16/22 0947     Visit Number 15    Number of Visits 21    Date for PT Re-Evaluation 05/04/22   pushed out for scheduling   Authorization Type [employer]    Progress Note Due on Visit 20    PT Start Time 0930    PT Stop Time 1015    PT Time Calculation (min) 45 min    Equipment Utilized During Treatment Gait belt    Activity Tolerance Patient tolerated treatment well    Behavior During Therapy WFL for tasks assessed/performed               Past Medical History:  Diagnosis Date   Allergies    Anemia    Arthritis    Asthma    Back pain    Chronic pain    Complication of anesthesia    woke up during colonoscopy   Diabetes ([place])    Diabetes mellitus without complication (HCC)    Edema, lower extremity    Fibroid    Fibromyalgia    Chronic   GERD (gastroesophageal reflux disease)    Headache    migraines   High blood pressure    History of hiatal hernia    History of stomach ulcers    IBS (irritable bowel syndrome)    Joint pain    Sleep apnea    did use cpap-lost 25lb-says she does not need it   NO CPAP   Wears contact lenses    Past Surgical History:  Procedure Laterality Date   BRAIN SURGERY Left 11/07/2021   [facility] Dr. Herschel [place]   BREAST BIOPSY     BREAST EXCISIONAL BIOPSY     BREAST LUMPECTOMY WITH RADIOACTIVE SEED LOCALIZATION Bilateral 11/24/2014   Procedure: BILATERAL BREAST LUMPECTOMY WITH RADIOACTIVE SEED LOCALIZATION;  Surgeon: [name], MD;  Location: [place];  Service: General;  Laterality: Bilateral;   CHOLECYSTECTOMY     COLONOSCOPY     DILATION AND CURETTAGE OF UTERUS     ESOPHAGOGASTRODUODENOSCOPY (EGD) WITH PROPOFOL N/A 07/23/2016   Procedure:  ESOPHAGOGASTRODUODENOSCOPY (EGD) WITH PROPOFOL;  Surgeon: Laurence Spates, MD;  Location: [facility];  Service: Endoscopy;  Laterality: N/A;   ESOPHAGOGASTRODUODENOSCOPY (EGD) WITH PROPOFOL N/A 06/11/2018   Procedure: ESOPHAGOGASTRODUODENOSCOPY (EGD) WITH PROPOFOL;  Surgeon: Laurence Spates, MD;  Location: [facility];  Service: Endoscopy;  Laterality: N/A;   LUMBAR LAMINECTOMY  2010   ORIF WRIST FRACTURE Right 11/24/2020   Procedure: OPEN REDUCTION INTERNAL FIXATION RIGHT WRIST FRACTURE;  Surgeon: Renette Butters, MD;  Location: [facility];  Service: Orthopedics;  Laterality: Right;   UMBILICAL HERNIA REPAIR     age 52   UPPER GI ENDOSCOPY     Patient Active Problem List   Diagnosis Date Noted   Cerebellar tumor ([place]) 11/15/2021   Comorbid sleep-related hypoventilation 10/19/2021   Urinary incontinence/areflexic bladder 10/05/2021   OSA, unable to tolerate CPAP (vomited every night) 09/05/2021   Chronic pain syndrome 09/05/2021   Chronic nausea 09/05/2021   Neurogenic bladder, followed by Urology, instructed I&O cath BID 09/05/2021   Intolerance of continuous positive airway pressure (CPAP) ventilation 09/03/2021   Insomnia secondary to chronic pain 07/24/2021   Primary osteoarthritis of right knee 07/03/2021   Myofascial  pain dysfunction syndrome 07/03/2021   Fibromyalgia 07/03/2021   Hyponatremia 07/03/2021   Primary osteoarthritis of left knee 07/03/2021   Body mass index (BMI) 50.0-59.9, adult ([place]) 03/07/2021   Allergic rhinitis due to animal (cat) (dog) hair and dander 10/06/2020   Allergic rhinitis due to pollen 10/06/2020   Moderate persistent asthma, uncomplicated 86/76/1950   Cerebellar mass 10/06/2020   Paresthesia 05/27/2020   Diabetes mellitus ([place]) 05/25/2020   Hypertension associated with diabetes ([place]) 05/25/2020   Hyperlipidemia associated with type 2 diabetes mellitus ([place]) 05/25/2020   Vitamin D deficiency 05/25/2020   NAFLD (nonalcoholic fatty liver disease)  05/25/2020   Sciatica of left side 05/25/2020    REFERRING DIAG: D49.6 (ICD-10-CM) - Neoplasm of unspecified behavior of brain    THERAPY DIAG:  Other abnormalities of gait and mobility  Unsteadiness on feet  Muscle weakness (generalized)  Other lack of coordination  History of falling  PERTINENT HISTORY: From H&P note on 11/15/2021:  "On the day of admission 11/07/2021, the patient was taken to the operating room by Dr. Recardo Evangelist of neurosurgery and underwent left suboccipital craniotomy, resection of cerebellar tumor. She tolerated the procedure well. Postoperative MRI performed on 3/22 revealed a small area of diffusion restriction in the left cerebellum."  Patient transferred to [place] on 11/15/2021, discharged 11/24/2021 per pt.  fibromyalgia, DM2, asthma, HTN, OSA, migraines, bilateral knee OA, myofascial pain syndrome    PRECAUTIONS: Fall  SUBJECTIVE: Pt fell on Monday of this week and states that Dr. Juleen China is aware.  She has an appt with Dr. Tomi Likens on 8/2 and she states he is going to look at everything.  Pt states she is dizzy this morning.  She ate eggs and grits for breakfast.  She states when she fell that she hit her head on the floor, but cannot recall the position or direction she fell.  She was returning to another room in her home when she fell using her rollator in the hallway coming from her bathroom.  She is running out of her meds and requests to call her pharmacy for delivery.  PAIN:  She endorses same pain from prior. Are you having pain? Yes: NPRS scale: 8.5/10 Pain location: All over (fibromyalgia), worse in head and neck and BLE (LLE worse)  - pain in a band like pattern around base and sides of head  Her ankle is bothering her since the fall.  This pain is 9/10.  VITALS (LUE): Today's Vitals   03/16/22 0936  BP: (!) 149/85  Pulse: 90    OBJECTIVE:    DIAGNOSTIC FINDINGS: From Brain MRI on 09/02/2021:  "Increased size of left superior cerebellar vermis  mass, now measuring 1.5 x 1.5 cm, previously 1.1 x 1.0 cm. Increased mild surrounding edema."  Future brain MRI ordered w/o date visible at this time.   COGNITION: Overall cognitive status: No family/caregiver present to determine baseline cognitive functioning - pt states "I feel stupid", she elaborates that she is having a hard time processing.     TODAY'S TREATMENT:  -Pt calls pharmacy at onset of session to request sooner delivery date for meds independent of PT influence/advice.  -Brief PROM of right ankle due to pain since fall.  She is not TTP around bilateral malleoli, distal tibia/fibula or sendesmosis.  Stressed inv/ev w/o inc pain.  Mild posterolateral ankle effusion.   -Thompson test negative, anterior drawer testing negative, squeeze test negative, Kleiger's test negative, heel thump test negative.  RAMP:  Level of Assistance: CGA Assistive  device utilized: Walker - 4 wheeled Ramp Comments: Outdoor incline x4, pt demonstrates dec speed ascending incline w/ inc forward lean.  GAIT: Gait pattern: step to pattern, step through pattern, decreased step length- Left, decreased stance time- Right, trunk flexed, and poor foot clearance- Right Distance walked: 300' (sidewalk) Assistive device utilized: Environmental consultant - 4 wheeled Level of assistance: SBA and CGA Comments: Pt demonstrates alternating lateral waivering with rollator due to inc forward trunk lean with fatigue.  -Attempted balance circuit x1:  standing FT trunk rotations 3x3lb wt ball w/ long lever arm > trunk bending cone taps in semi-circle > lateral gumdrop taps forwards and backwards, pt demonstrating inc fatigue and need for inc cuing throughout, activity D/C'd  -SciFit x60mns w/ BLE only in hill mode L 3.0 for LE strength and endurance.  PATIENT EDUCATION: Education details: Continue HEP and walking program.  Reach out to PCP if swelling in ankle does not resolve in 2-3 days. Person educated: Patient Education method:  Explanation  Education comprehension: verbalized understanding and needs further education     HOME EXERCISE PROGRAM: Access Code: [email or web address] URL: [email or web address] Date: 01/16/2022 Prepared by: MElease Etienne Exercises - Sit to Stand Without Arm Support  - 1 x daily - 7 x weekly - 3 sets - 10 reps - Standing March with Counter Support  - 1 x daily - 7 x weekly - 3 sets - 10 reps - Chin Tuck  - 1 x daily - 7 x weekly - 3 sets - 10 reps - Seated Cervical Sidebending Stretch  - 1 x daily - 7 x weekly - 3 sets - 10 reps - Supine Bridge  - 1 x daily - 4 x weekly - 3 sets - 10 reps - Supine Active Straight Leg Raise  - 1 x daily - 4 x weekly - 2 sets - 8 reps - Forward and Backward Monster Walk with Counter Support  - 1 x daily - 4 x weekly - 3 sets - 10 reps - Seated Heel Raise  - 1 x daily - 4 x weekly - 2 sets - 12 reps - Single Leg Stance on Pillow  - 1 x daily - 4 x weekly - 1 sets - 4 reps - Heel Raises with Counter Support  - 1 x daily - 4 x weekly - 2 sets - 8 reps - Sidelying ITB Stretch off Table  - 1 x daily - 4 x weekly - 1 sets - 2 reps - 45 seconds hold  Initiated walking program 01/30/2022:  Walk in home 2 mins at a time, 5x per day with the RW or rollator.    Seated stepper (pt states she has one that she would like to use) x8-10 minutes in chair with back support to maintain posture, progress time as able by increments of 1-2 minutes a week.  GOALS:  SHORT TERM GOALS:   Target date: 03/23/2022  Walking program will be progressed with pt compliant to promote aerobic gains and activity tolerance. Baseline: Initial walking program established with pt compliant. Goal status: INITIAL  2.  Pt will increase BERG balance score to 50/56 to demonstrate improved static balance. Baseline: 46/56 Goal status: REVISED  3.  Pt will improve FGA score to >/=15/30 in order to demonstrate improved balance and decreased fall risk. Baseline: 11/30 Goal status:  INITIAL  4.  PT to initiate stair training with pt able to negotiate 4 stairs using BUE support and step-to pattern to promote safe independence in home  environment. Baseline: To be further assessed. Goal status: INITIAL  LONG TERM GOALS:  Target date: 04/20/2022  Pt will tolerate >/=8 minutes of continuous aerobic exercise to promote improved activity tolerance. Baseline: 5 minutes. Goal status: INITIAL  2.  Pt will ambulate >/=500' on various level and unlevel surfaces including ramped surface w/ LRAD and supervision to improve safe access to community environment. Baseline: limited to level surfaces w/ RW Goal status: INITIAL  3.  Pt will manage 8 stairs w/ unilateral rail at supervision level to promote safe independence in home and community environments. Baseline: To be further assessed.. Goal status: INITIAL  4.  Pt will improve FGA score to >/=20/30 in order to demonstrate improved balance and decreased fall risk. Baseline: 11/30 Goal status: INITIAL  ASSESSMENT:   CLINICAL IMPRESSION: Session regressed mildly this session with focus remaining on gait training and balance on unlevel surfaces using rollator and w/o.  Pt was having some discomfort in the right ankle this session from recent fall somewhat limiting intensity of session.  Will progress patient towards LTGs and improved activity tolerance in coming sessions as able.  OBJECTIVE IMPAIRMENTS Abnormal gait, decreased activity tolerance, decreased balance, decreased cognition, decreased coordination, decreased endurance, decreased knowledge of condition, decreased knowledge of use of DME, decreased mobility, difficulty walking, decreased ROM, decreased strength, and decreased safety awareness.    ACTIVITY LIMITATIONS cleaning, community activity, driving, meal prep, laundry, and shopping.    PERSONAL FACTORS Age, Behavior pattern, Fitness, and 3+ comorbidities: fibromyalgia, DM2, asthma, HTN, OSA, migraines, bilateral  knee OA, myofascial pain syndrome  are also affecting patient's functional outcome.      REHAB POTENTIAL: Fair hx of fibromyalgia, DM2, asthma, HTN, OSA, migraines, bilateral knee OA, myofascial pain syndrome , personal factors   CLINICAL DECISION MAKING: Evolving/moderate complexity   EVALUATION COMPLEXITY: Moderate   PLAN: PT FREQUENCY: 1x/week   PT DURATION: 8 weeks (re-cert until 5/53/7482 due to scheduling)   PLANNED INTERVENTIONS: Therapeutic exercises, Therapeutic activity, Neuromuscular re-education, Balance training, Gait training, Patient/Family education, Joint mobilization, Stair training, Vestibular training, DME instructions, Manual therapy, and self care and home management   PLAN FOR NEXT SESSION:  Outdoor ambulation w/ and w/o rollator, balance.  SciFit.  Gait w/ rollator vs w/o.  Modify HEP prn.  Endurance, BLE strength-quad strengthening, work on backwards walking w/o support, STS w/ overhead press/10# kettlebell hold, circuit training-balance.     Bary Richard, PT, DPT 03/16/2022, 11:18 AM

## 2022-03-19 DIAGNOSIS — R69 Illness, unspecified: Secondary | ICD-10-CM | POA: Diagnosis not present

## 2022-03-20 DIAGNOSIS — R339 Retention of urine, unspecified: Secondary | ICD-10-CM | POA: Diagnosis not present

## 2022-03-20 DIAGNOSIS — R32 Unspecified urinary incontinence: Secondary | ICD-10-CM | POA: Diagnosis not present

## 2022-03-20 NOTE — Progress Notes (Unsigned)
NEUROLOGY FOLLOW UP OFFICE NOTE  Virginia Luna [id number]  Assessment/Plan:   1.Vestibular migraine/chronic migraine without aura, without status migrainosus, not intractable 2. 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 3.  Black out spell (she zoned out) - unclear etiology.  While in rehab, she had episode of shaking which appeared to be restlessness in bed possibly due to her apnea.  No loss of consciousness.  Semiology not likely to be seizure.   Migraine prevention:  Botox.  In addition will start zonisamide '100mg'$  daily to help further reduce headache frequency (and address potential seizure although I am not convinced this was a seziure) Limit use of pain relievers to no more than 2 days out of week to prevent risk of rebound or medication-overuse headache. Keep headache diary Follow up 6 months      Subjective:  Virginia Luna is a 61 year old right-handed female with diabetes, chronic back pain, and IBS who follows up for migraine and brain mass.     UPDATE: To assist in migraine control, as well as history of potential seizure, started zonisamide in May.  ***   Current NSAIDS/analgesics:  Hydrocodone-acetaminophen (pain), diclofenac '75mg'$  Current triptans:  Maxalt '10mg'$  Current ergotamine:  none Current anti-emetic:  Zofran ODT '4mg'$  Current muscle relaxants:  Robaxin Current Antihypertensive medications:  Metoprolol succinate, amlodipine Current Antidepressant medications:  none Current Anticonvulsant medications: oxcarbazepine '900mg'$  at bedtime (for chronic pain), zonisamide '100mg'$  daily Current anti-CGRP:  none Current Vitamins/Herbal/Supplements:  D, melatonin Current Antihistamines/Decongestants:  Meclizine '25mg'$  PRN, Benadryl '50mg'$  QHS, Flonase Other therapy:  ice Hormone/birth control:  none Other medications:  Ambien, Seroquel   Caffeine:  No coffee.  Occasional Coke Diet:  1 gallon water daily.  Tries not  to skip meals.   Exercise:  Unable due to pain, nausea and dizziness Depression/Anxiety:  yes Other pain:  fibromyalgia Sleep hygiene: Found to have mild OSA but with severe sleep hypoxia.  Started CPAP   HISTORY:  She has had migraines for many years.  They were manageable, usually occurring once a month.  They started to become more frequent in 2020, progressing to the point that she now has a persistent daily headache.  They are typically bifrontal and pounding, associated with nausea, vomiting, vertigo, photophobia, phonophobia and blurred vision but no numbness or weakness.  Intensity will fluctuate from dull-moderate to severe about once a week for 2-3 days.  Sunlight, loud noise and quick movements are aggravating factors.  Resting in a dark and cool quiet room helps relieve pain.  She cannot really identify a specific trigger for her chronic daily headache but it may have followed a fall in which she tripped and hit her head.   She treats headache with sumatriptan (2-3 days a week) and treats nausea with Zofran and dizziness with meclizine.  She also takes hydrocodone for fibromyalgia.    MRI of brain with and without contrast on 09/03/2020 showed enhancing 8 x 6 mm posterior fossa mass left of midline with edema within the superior cerebellum.  Follow up MRI on 09/24/2020 was stable.  She was referred to neurosurgery who favored to closely monitor.  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 Repeat MRI of brain with and without contrast on 09/03/2021 personally reviewed showed increase size of the left superior cerebellar vermis mass from 1.1 x 1.0 cm to 1.5 x 1.5 cm with mild increased surrounding edema.  Plan was to undergo surgery with Dr. Ronnald Ramp.  However, she presented to [facility] in [place] with tremors and increased headaches.  She underwent craniotomy and tumor resection there with Dr. Recardo Evangelist of neurosurgery.   Postoperative MRI showed small area of diffusion restriction in the cerebellum.  No complications.  She was discharged to inpatient rehab where it was noted to have bilateral upper and lower extremity thrashing without loss of consciousness at night while in bed.  Evalauted by neurology who did not suspect seizure but rather secondary to her untreated sleep apnea. Started CPAP.  She finds Botox to be helpful.  Headaches are no longer diffuse, just left sided although it can be painful.  Occurring 15 days a month.  She reports that she had an episode where she "blacked out".  She was sitting watching TV while talking to a friend on the phone and the next thing she knew, something else was playing on the TV suggesting time elapsed.  She was still on the phone with her friend. She does not currently drive.     Past NSAIDS/analgesics:  Ibuprofen, naproxen, Excedrin/BC/Goody Past abortive triptans:  sumatriptan  Past abortive ergotamine:  none Past muscle relaxants:  Flexeril Past anti-emetic:  Promethazine '25mg'$  Past antihypertensive medications:  none Past antidepressant medications:  Amitriptyline or nortriptyline, venlafaxine Past anticonvulsant medications:  topiramate, gabapentin Past anti-CGRP:  Emgality Past vitamins/Herbal/Supplements:  none Past antihistamines/decongestants:  none Other past therapies:  vestibular rehab     Family history of headache:  unknown  PAST MEDICAL HISTORY: Past Medical History:  Diagnosis Date   Allergies    Anemia    Arthritis    Asthma    Back pain    Chronic pain    Complication of anesthesia    woke up during colonoscopy   Diabetes ([place])    Diabetes mellitus without complication (HCC)    Edema, lower extremity    Fibroid    Fibromyalgia    Chronic   GERD (gastroesophageal reflux disease)    Headache    migraines   High blood pressure    History of hiatal hernia    History of stomach ulcers    IBS (irritable bowel syndrome)    Joint pain     Sleep apnea    did use cpap-lost 25lb-says she does not need it   NO CPAP   Wears contact lenses     MEDICATIONS: Current Outpatient Medications on File Prior to Visit  Medication Sig Dispense Refill   acetaminophen (TYLENOL) 325 MG tablet Take 1-2 tablets (325-650 mg total) by mouth every 4 (four) hours as needed for mild pain.     albuterol (PROVENTIL) (2.5 MG/3ML) 0.083% nebulizer solution Take 3 mLs (2.5 mg total) by nebulization every 6 (six) hours as needed for up to 14 days for wheezing or shortness of breath. 75 mL 0   amLODipine (NORVASC) 5 MG tablet Take 1 tablet (5 mg total) by mouth daily. 30 tablet 0   atorvastatin (LIPITOR) 10 MG tablet Take 1 tablet (10 mg total) by mouth at bedtime. 30 tablet 0   azelastine (ASTELIN) 0.1 % nasal spray Place 1 spray into both nostrils 2 (two) times daily as needed for rhinitis. 30 mL 0   BREO ELLIPTA 200-25 MCG/ACT AEPB Inhale 1 puff into the lungs daily.     cyclobenzaprine (FLEXERIL) 10 MG tablet Take 20 mg by mouth at bedtime.     diclofenac (VOLTAREN) 75 MG EC tablet Take 75 mg by  mouth 2 (two) times daily.     EPINEPHrine 0.3 mg/0.3 mL IJ SOAJ injection Inject 0.3 mg into the muscle as needed for anaphylaxis.     fluticasone (FLONASE) 50 MCG/ACT nasal spray Place 1 spray into both nostrils 2 (two) times daily as needed for allergies.     furosemide (LASIX) 20 MG tablet Take 20 mg by mouth daily as needed for fluid.     gabapentin (NEURONTIN) 300 MG capsule Take 300 mg by mouth 2 (two) times daily.     HYDROcodone-acetaminophen (NORCO/VICODIN) 5-325 MG tablet Take 2 tablets by mouth at bedtime.     levocetirizine (XYZAL) 5 MG tablet Take 1 tablet (5 mg total) by mouth every evening. 30 tablet 0   linaclotide (LINZESS) 72 MCG capsule Take 1 capsule (72 mcg total) by mouth daily as needed (constipation). 90 capsule 0   meclizine (ANTIVERT) 12.5 MG tablet Take 12.5 mg by mouth 3 (three) times daily as needed.     meclizine (ANTIVERT) 25 MG  tablet Take 1 tablet (25 mg total) by mouth 3 (three) times daily as needed for dizziness. (Patient not taking: Reported on 02/07/2022) 60 tablet 0   melatonin 5 MG TABS Take 1 tablet (5 mg total) by mouth at bedtime. 30 tablet 0   methocarbamol (ROBAXIN) 750 MG tablet Take 1 tablet (750 mg total) by mouth every 8 (eight) hours as needed for muscle spasms. (Patient taking differently: Take 750 mg by mouth 3 (three) times daily.) 60 tablet 0   montelukast (SINGULAIR) 10 MG tablet Take 1 tablet (10 mg total) by mouth at bedtime. 30 tablet 0   NON FORMULARY CPAP at bedtime     ondansetron (ZOFRAN ODT) 4 MG disintegrating tablet Take 1 tablet (4 mg total) by mouth every 6 (six) hours as needed for nausea or vomiting. 20 tablet 0   ondansetron (ZOFRAN) 4 MG tablet Take 1 tablet (4 mg total) by mouth every 8 (eight) hours as needed for nausea or vomiting. (Patient not taking: Reported on 02/07/2022) 90 tablet 0   Oxcarbazepine (TRILEPTAL) 300 MG tablet Take 3 tablets (900 mg total) by mouth at bedtime. For nerve pain 90 tablet 5   oxyCODONE (OXY IR/ROXICODONE) 5 MG immediate release tablet Take 5 mg by mouth 2 (two) times daily as needed for moderate pain.     pantoprazole (PROTONIX) 40 MG tablet Take 1 tablet (40 mg total) by mouth daily. 30 tablet 0   polyethylene glycol (MIRALAX / GLYCOLAX) 17 g packet Take 17 g by mouth daily as needed for mild constipation. 14 each 0   QUEtiapine (SEROQUEL) 300 MG tablet Take 1 tablet (300 mg total) by mouth at bedtime. 30 tablet 0   Semaglutide,0.25 or 0.'5MG'$ /DOS, (OZEMPIC, 0.25 OR 0.5 MG/DOSE,) 2 MG/1.5ML SOPN Inject 0.5 mg into the skin once a week. (Patient taking differently: Inject 0.5 mg into the skin every Sunday.) 1.5 mL 0   senna-docusate (SENOKOT-S) 8.6-50 MG tablet Take 2 tablets by mouth at bedtime. 60 tablet 0   SUMAtriptan (IMITREX) 100 MG tablet Take 1 tablet (100 mg total) by mouth every 2 (two) hours as needed for migraine or headache (max 2x in 24 hour  period). May repeat in 2 hours if headache persists or recurs. 10 tablet 0   Tetrahydrozoline HCl (VISINE OP) Place 1 drop into both eyes 2 (two) times daily as needed (dry eyes).     Vitamin D, Ergocalciferol, (DRISDOL) 1.25 MG (50000 UNIT) CAPS capsule TAKE ONE CAPSULE BY MOUTH  EVERY 7 DAYS (Patient taking differently: Take 50,000 Units by mouth every Sunday.) 4 capsule 0   zolpidem (AMBIEN) 10 MG tablet Take 1 tablet (10 mg total) by mouth at bedtime. 30 tablet 5   zonisamide (ZONEGRAN) 100 MG capsule Take 2 capsules (200 mg total) by mouth daily. 30 capsule 5   No current facility-administered medications on file prior to visit.    ALLERGIES: Allergies  Allergen Reactions   Rocephin [Ceftriaxone] Anaphylaxis   Morphine And Related Itching and Nausea And Vomiting    Doesn't work   Prednisone Itching and Swelling   Sulfa Antibiotics Itching and Swelling   Amitriptyline Other (See Comments)   Penicillin G Sodium Other (See Comments)    FAMILY HISTORY: Family History  Problem Relation Age of Onset   Obesity Mother    Diabetes Father    High blood pressure Father    Sudden death Father    Breast cancer Paternal Grandmother    Breast cancer Paternal Aunt       Objective:  *** General: No acute distress.  Patient appears ***-groomed.   Head:  Normocephalic/atraumatic Eyes:  Fundi examined but not visualized Neck: supple, no paraspinal tenderness, full range of motion Heart:  Regular rate and rhythm Lungs:  Clear to auscultation bilaterally Back: No paraspinal tenderness Neurological Exam: alert and oriented to person, place, and time.  Speech fluent and not dysarthric, language intact.  CN II-XII intact. Bulk and tone normal, muscle strength 5/5 throughout.  Sensation to light touch intact.  Deep tendon reflexes 2+ throughout, toes downgoing.  Finger to nose testing intact.  Gait normal, Romberg negative.   Metta Clines, DO  CC: ***

## 2022-03-21 ENCOUNTER — Ambulatory Visit: Payer: Medicare HMO | Admitting: Neurology

## 2022-03-21 ENCOUNTER — Encounter: Payer: Self-pay | Admitting: Neurology

## 2022-03-21 VITALS — BP 131/80 | HR 80 | Ht 65.0 in | Wt 272.0 lb

## 2022-03-21 DIAGNOSIS — F418 Other specified anxiety disorders: Secondary | ICD-10-CM

## 2022-03-21 DIAGNOSIS — D432 Neoplasm of uncertain behavior of brain, unspecified: Secondary | ICD-10-CM | POA: Diagnosis not present

## 2022-03-21 DIAGNOSIS — R55 Syncope and collapse: Secondary | ICD-10-CM

## 2022-03-21 DIAGNOSIS — J301 Allergic rhinitis due to pollen: Secondary | ICD-10-CM | POA: Diagnosis not present

## 2022-03-21 DIAGNOSIS — D431 Neoplasm of uncertain behavior of brain, infratentorial: Secondary | ICD-10-CM | POA: Diagnosis not present

## 2022-03-21 DIAGNOSIS — J3089 Other allergic rhinitis: Secondary | ICD-10-CM | POA: Diagnosis not present

## 2022-03-21 DIAGNOSIS — G43709 Chronic migraine without aura, not intractable, without status migrainosus: Secondary | ICD-10-CM

## 2022-03-21 DIAGNOSIS — D496 Neoplasm of unspecified behavior of brain: Secondary | ICD-10-CM | POA: Diagnosis not present

## 2022-03-21 DIAGNOSIS — J3081 Allergic rhinitis due to animal (cat) (dog) hair and dander: Secondary | ICD-10-CM | POA: Diagnosis not present

## 2022-03-21 NOTE — Patient Instructions (Signed)
Continue Botox, zonisamide, rizatriptan Follow up for next Botox

## 2022-03-23 ENCOUNTER — Encounter: Payer: Self-pay | Admitting: Physical Therapy

## 2022-03-23 ENCOUNTER — Ambulatory Visit: Payer: Medicare HMO | Attending: Physician Assistant | Admitting: Physical Therapy

## 2022-03-23 VITALS — BP 144/82 | HR 97

## 2022-03-23 DIAGNOSIS — R2689 Other abnormalities of gait and mobility: Secondary | ICD-10-CM | POA: Diagnosis not present

## 2022-03-23 DIAGNOSIS — R2681 Unsteadiness on feet: Secondary | ICD-10-CM | POA: Insufficient documentation

## 2022-03-23 DIAGNOSIS — R278 Other lack of coordination: Secondary | ICD-10-CM | POA: Insufficient documentation

## 2022-03-23 DIAGNOSIS — Z9181 History of falling: Secondary | ICD-10-CM | POA: Insufficient documentation

## 2022-03-23 DIAGNOSIS — M6281 Muscle weakness (generalized): Secondary | ICD-10-CM | POA: Insufficient documentation

## 2022-03-23 NOTE — Therapy (Signed)
OUTPATIENT PHYSICAL THERAPY TREATMENT NOTE   Patient Name: Virginia Luna MRN: [id number] DOB:10/25/1960, 61 y.o., female Today's Date: 03/23/2022  PCP: Shirline Frees, MD REFERRING PROVIDER: Courtney Heys, MD  END OF SESSION:   PT End of Session - 03/23/22 0806     Visit Number 16    Number of Visits 21    Date for PT Re-Evaluation 05/04/22   pushed out for scheduling   Authorization Type [employer]    Progress Note Due on Visit 20    PT Start Time 0803    PT Stop Time 0844    PT Time Calculation (min) 41 min    Equipment Utilized During Treatment Gait belt    Activity Tolerance Patient tolerated treatment well    Behavior During Therapy WFL for tasks assessed/performed               Past Medical History:  Diagnosis Date   Allergies    Anemia    Arthritis    Asthma    Back pain    Chronic pain    Complication of anesthesia    woke up during colonoscopy   Diabetes ([place])    Diabetes mellitus without complication (HCC)    Edema, lower extremity    Fibroid    Fibromyalgia    Chronic   GERD (gastroesophageal reflux disease)    Headache    migraines   High blood pressure    History of hiatal hernia    History of stomach ulcers    IBS (irritable bowel syndrome)    Joint pain    Sleep apnea    did use cpap-lost 25lb-says she does not need it   NO CPAP   Wears contact lenses    Past Surgical History:  Procedure Laterality Date   BRAIN SURGERY Left 11/07/2021   [facility] Dr. Herschel [place]   BREAST BIOPSY     BREAST EXCISIONAL BIOPSY     BREAST LUMPECTOMY WITH RADIOACTIVE SEED LOCALIZATION Bilateral 11/24/2014   Procedure: BILATERAL BREAST LUMPECTOMY WITH RADIOACTIVE SEED LOCALIZATION;  Surgeon: [name], MD;  Location: [facility];  Service: General;  Laterality: Bilateral;   CHOLECYSTECTOMY     COLONOSCOPY     DILATION AND CURETTAGE OF UTERUS     ESOPHAGOGASTRODUODENOSCOPY (EGD) WITH PROPOFOL N/A 07/23/2016   Procedure:  ESOPHAGOGASTRODUODENOSCOPY (EGD) WITH PROPOFOL;  Surgeon: Laurence Spates, MD;  Location: [facility];  Service: Endoscopy;  Laterality: N/A;   ESOPHAGOGASTRODUODENOSCOPY (EGD) WITH PROPOFOL N/A 06/11/2018   Procedure: ESOPHAGOGASTRODUODENOSCOPY (EGD) WITH PROPOFOL;  Surgeon: Laurence Spates, MD;  Location: [facility];  Service: Endoscopy;  Laterality: N/A;   LUMBAR LAMINECTOMY  2010   ORIF WRIST FRACTURE Right 11/24/2020   Procedure: OPEN REDUCTION INTERNAL FIXATION RIGHT WRIST FRACTURE;  Surgeon: Renette Butters, MD;  Location: [facility];  Service: Orthopedics;  Laterality: Right;   UMBILICAL HERNIA REPAIR     age 40   UPPER GI ENDOSCOPY     Patient Active Problem List   Diagnosis Date Noted   Cerebellar tumor ([place]) 11/15/2021   Comorbid sleep-related hypoventilation 10/19/2021   Urinary incontinence/areflexic bladder 10/05/2021   OSA, unable to tolerate CPAP (vomited every night) 09/05/2021   Chronic pain syndrome 09/05/2021   Chronic nausea 09/05/2021   Neurogenic bladder, followed by Urology, instructed I&O cath BID 09/05/2021   Intolerance of continuous positive airway pressure (CPAP) ventilation 09/03/2021   Insomnia secondary to chronic pain 07/24/2021   Primary osteoarthritis of right knee 07/03/2021   Myofascial  pain dysfunction syndrome 07/03/2021   Fibromyalgia 07/03/2021   Hyponatremia 07/03/2021   Primary osteoarthritis of left knee 07/03/2021   Body mass index (BMI) 50.0-59.9, adult ([place]) 03/07/2021   Allergic rhinitis due to animal (cat) (dog) hair and dander 10/06/2020   Allergic rhinitis due to pollen 10/06/2020   Moderate persistent asthma, uncomplicated 63/89/3734   Cerebellar mass 10/06/2020   Paresthesia 05/27/2020   Diabetes mellitus ([place]) 05/25/2020   Hypertension associated with diabetes ([place]) 05/25/2020   Hyperlipidemia associated with type 2 diabetes mellitus ([place]) 05/25/2020   Vitamin D deficiency 05/25/2020   NAFLD (nonalcoholic fatty liver disease)  05/25/2020   Sciatica of left side 05/25/2020    REFERRING DIAG: D49.6 (ICD-10-CM) - Neoplasm of unspecified behavior of brain    THERAPY DIAG:  Other abnormalities of gait and mobility  Unsteadiness on feet  Muscle weakness (generalized)  Other lack of coordination  PERTINENT HISTORY: From H&P note on 11/15/2021:  "On the day of admission 11/07/2021, the patient was taken to the operating room by Dr. Recardo Evangelist of neurosurgery and underwent left suboccipital craniotomy, resection of cerebellar tumor. She tolerated the procedure well. Postoperative MRI performed on 3/22 revealed a small area of diffusion restriction in the left cerebellum."  Patient transferred to [place] on 11/15/2021, discharged 11/24/2021 per pt.  fibromyalgia, DM2, asthma, HTN, OSA, migraines, bilateral knee OA, myofascial pain syndrome    PRECAUTIONS: Fall  SUBJECTIVE: Pt is tearful at onset of session stating she is disappointed after her recent visit with Dr. Juleen China because "my falling will never stop".  She states she wanted this to be fixed.  She has not fallen recently, but is concerned that her condition will never be fixed.  PAIN:  Are you having pain? Yes: NPRS scale: 8/10 Pain location: All over (fibromyalgia), worse in head and neck and BLE (LLE worse)  -base and sides of head   Right ankle is better today.  VITALS (LUE): Today's Vitals   03/23/22 0812  BP: (!) 144/82  Pulse: 97    OBJECTIVE:    DIAGNOSTIC FINDINGS: From Brain MRI on 09/02/2021:  "Increased size of left superior cerebellar vermis mass, now measuring 1.5 x 1.5 cm, previously 1.1 x 1.0 cm. Increased mild surrounding edema."  Future brain MRI ordered w/o date visible at this time.   COGNITION: Overall cognitive status: No family/caregiver present to determine baseline cognitive functioning - pt states "I feel stupid", she elaborates that she is having a hard time processing.     TODAY'S TREATMENT:  Self-care/Home  Management: -Discussed pt progress and limitations of PT with existing conditions to help promote realistic goals and continued motivation to participate in therapy.  Pt expresses frustration with where she is and becomes tearful again requiring therapeutic listening and encouragement as appropriate.  Discussed her plans to follow-up with psychology as MD recommends, she states she already has an appt. -Verbally reviewed HEP.  Pt states compliance. -Discussed walking program and pt's progression at home.  She states she is currently walking about 7-8 minutes a day.  Assessed BERG & FGA:  [facility] PT Assessment - 03/23/22 0822       Berg Balance Test   Sit to Stand Able to stand without using hands and stabilize independently    Standing Unsupported Able to stand safely 2 minutes    Sitting with Back Unsupported but Feet Supported on Floor or Stool Able to sit safely and securely 2 minutes    Stand to Sit Sits safely with minimal use  of hands    Transfers Able to transfer safely, minor use of hands    Standing Unsupported with Eyes Closed Able to stand 10 seconds safely    Standing Unsupported with Feet Together Able to place feet together independently and stand 1 minute safely    From Standing, Reach Forward with Outstretched Arm Can reach forward >12 cm safely (5")    From Standing Position, Pick up Object from [place] to pick up shoe safely and easily    From Standing Position, Turn to Look Behind Over each Shoulder Looks behind from both sides and weight shifts well    Turn 360 Degrees Able to turn 360 degrees safely one side only in 4 seconds or less   turns to right in 4 seconds, to left in 5 sec   Standing Unsupported, Alternately Place Feet on Step/Stool Able to stand independently and complete 8 steps >20 seconds    Standing Unsupported, One Foot in Front Able to take small step independently and hold 30 seconds    Standing on One Leg Tries to lift leg/unable to hold 3 seconds but  remains standing independently    Total Score 48    Berg comment: Moderate fall risk      Functional Gait  Assessment   Gait assessed  Yes    Gait Level Surface Walks 20 ft in less than 7 sec but greater than 5.5 sec, uses assistive device, slower speed, mild gait deviations, or deviates 6-10 in outside of the 12 in walkway width.    Change in Gait Speed Makes only minor adjustments to walking speed, or accomplishes a change in speed with significant gait deviations, deviates 10-15 in outside the 12 in walkway width, or changes speed but loses balance but is able to recover and continue walking.    Gait with Horizontal Head Turns Performs head turns with moderate changes in gait velocity, slows down, deviates 10-15 in outside 12 in walkway width but recovers, can continue to walk.    Gait with Vertical Head Turns Performs task with moderate change in gait velocity, slows down, deviates 10-15 in outside 12 in walkway width but recovers, can continue to walk.    Gait and Pivot Turn Pivot turns safely in greater than 3 sec and stops with no loss of balance, or pivot turns safely within 3 sec and stops with mild imbalance, requires small steps to catch balance.    Step Over Obstacle Is able to step over one shoe box (4.5 in total height) without changing gait speed. No evidence of imbalance.    Gait with Narrow Base of Support Ambulates less than 4 steps heel to toe or cannot perform without assistance.    Gait with Eyes Closed Walks 20 ft, no assistive devices, good speed, no evidence of imbalance, normal gait pattern, deviates no more than 6 in outside 12 in walkway width. Ambulates 20 ft in less than 7 sec.    Ambulating Backwards Walks 20 ft, no assistive devices, good speed, no evidence for imbalance, normal gait    Steps Alternating feet, must use rail.    Total Score 17    FGA comment: Significant fall risk <19            STAIRS:  Level of Assistance: SBA  Stair Negotiation Technique:  Alternating Pattern  with Bilateral Rails-pt has bilateral rails at home currently  Number of Stairs: 8   Height of Stairs: 6"  Comments: Pt demonstrates some improvement in trunk  upright posture during ascent and descent and shows good bilateral control during descent.  PATIENT EDUCATION: Education details:  Discussed gradual walking program progression to advance activity tolerance.  Discussed safety with stairs.  Discussed assessment outcomes and current functional progress. Person educated: Patient Education method: Explanation  Education comprehension: verbalized understanding and needs further education     HOME EXERCISE PROGRAM: Access Code: [email or web address] URL: [email or web address] Date: 01/16/2022 Prepared by: Elease Etienne  Exercises - Sit to Stand Without Arm Support  - 1 x daily - 7 x weekly - 3 sets - 10 reps - Standing March with Counter Support  - 1 x daily - 7 x weekly - 3 sets - 10 reps - Chin Tuck  - 1 x daily - 7 x weekly - 3 sets - 10 reps - Seated Cervical Sidebending Stretch  - 1 x daily - 7 x weekly - 3 sets - 10 reps - Supine Bridge  - 1 x daily - 4 x weekly - 3 sets - 10 reps - Supine Active Straight Leg Raise  - 1 x daily - 4 x weekly - 2 sets - 8 reps - Forward and Backward Monster Walk with Counter Support  - 1 x daily - 4 x weekly - 3 sets - 10 reps - Seated Heel Raise  - 1 x daily - 4 x weekly - 2 sets - 12 reps - Single Leg Stance on Pillow  - 1 x daily - 4 x weekly - 1 sets - 4 reps - Heel Raises with Counter Support  - 1 x daily - 4 x weekly - 2 sets - 8 reps - Sidelying ITB Stretch off Table  - 1 x daily - 4 x weekly - 1 sets - 2 reps - 45 seconds hold  Initiated walking program 01/30/2022:  Walk in home 2 mins at a time, 5x per day with the RW or rollator.    Seated stepper (pt states she has one that she would like to use) x8-10 minutes in chair with back support to maintain posture, progress time as able by increments of 1-2 minutes a  week.  GOALS:  SHORT TERM GOALS:   Target date: 03/23/2022  Walking program will be progressed with pt compliant to promote aerobic gains and activity tolerance. Baseline: Established with pt compliant, needs progression. Goal status: MET  2.  Pt will increase BERG balance score to 50/56 to demonstrate improved static balance. Baseline: 46/56; 03/23/2022 48/56 Goal status: NOT MET  3.  Pt will improve FGA score to >/=15/30 in order to demonstrate improved balance and decreased fall risk. Baseline: 11/30; 03/23/2022 17/30 Goal status: MET  4.  PT to initiate stair training with pt able to negotiate 4 stairs using BUE support and step-to pattern to promote safe independence in home environment. Baseline: 8 stairs w/ Bil rails alt pattern SBA Goal status: MET  LONG TERM GOALS:  Target date: 04/20/2022  Pt will tolerate >/=8 minutes of continuous aerobic exercise to promote improved activity tolerance. Baseline: 5 minutes. Goal status: INITIAL  2.  Pt will ambulate >/=500' on various level and unlevel surfaces including ramped surface w/ LRAD and supervision to improve safe access to community environment. Baseline: limited to level surfaces w/ RW Goal status: INITIAL  3.  Pt will manage 8 stairs w/ unilateral rail alternating pattern at supervision level to promote safe independence in home and community environments. Baseline: 8 stairs w/ Bil rails alt pattern SBA Goal status: REVISED  4.  Pt will improve FGA score to >/=20/30 in order to demonstrate improved balance and decreased fall risk. Baseline: 11/30; 03/23/2022 17/30 Goal status: INITIAL  ASSESSMENT:   CLINICAL IMPRESSION: Assessed STGs this session following brief discussion of realistic goals with patient due to lability noted at onset of session.  Pt continues to be motivated to progress with PT, but expressed worry this session that she will never stop falling.  Therapeutic listening and encouragement provided to motivate  patient during goal assessment.  Pt meets 3 of 4 STGs this session.  Her BERG improved just shy of goal level from 46/56 to 48/56 without categorical significance to her fall risk, she remains moderate fall risk.  Her FGA greatly improved from 18/86 on re-cert to 77/37 this visit without categorical change in fall risk, she remains significant fall risk dynamically.  She performs stairs reciprocally using bilateral rails stating she has a temporary change to home setup with bilateral rails.  She is walking frequently, but would benefit from increased periods of ambulation to progress her overall endurance.  She continues to benefit from skilled PT intervention to progress towards LTGs and optimize safe functional independence.  OBJECTIVE IMPAIRMENTS Abnormal gait, decreased activity tolerance, decreased balance, decreased cognition, decreased coordination, decreased endurance, decreased knowledge of condition, decreased knowledge of use of DME, decreased mobility, difficulty walking, decreased ROM, decreased strength, and decreased safety awareness.    ACTIVITY LIMITATIONS cleaning, community activity, driving, meal prep, laundry, and shopping.    PERSONAL FACTORS Age, Behavior pattern, Fitness, and 3+ comorbidities: fibromyalgia, DM2, asthma, HTN, OSA, migraines, bilateral knee OA, myofascial pain syndrome  are also affecting patient's functional outcome.      REHAB POTENTIAL: Fair hx of fibromyalgia, DM2, asthma, HTN, OSA, migraines, bilateral knee OA, myofascial pain syndrome , personal factors   CLINICAL DECISION MAKING: Evolving/moderate complexity   EVALUATION COMPLEXITY: Moderate   PLAN: PT FREQUENCY: 1x/week   PT DURATION: 8 weeks (re-cert until 3/66/8159 due to scheduling)   PLANNED INTERVENTIONS: Therapeutic exercises, Therapeutic activity, Neuromuscular re-education, Balance training, Gait training, Patient/Family education, Joint mobilization, Stair training, Vestibular training, DME  instructions, Manual therapy, and self care and home management   PLAN FOR NEXT SESSION:  Outdoor ambulation w/ and w/o rollator, balance.  SciFit.  Gait w/ rollator vs w/o.  Modify HEP prn.  Endurance, BLE strength-quad strengthening, work on backwards walking w/o support, STS w/ overhead press/10# kettlebell hold, circuit training-balance.     Bary Richard, PT, DPT 03/23/2022, 10:54 AM

## 2022-03-27 ENCOUNTER — Encounter: Payer: Self-pay | Admitting: Physical Therapy

## 2022-03-27 ENCOUNTER — Ambulatory Visit: Payer: Medicare HMO | Admitting: Physical Therapy

## 2022-03-27 DIAGNOSIS — R2689 Other abnormalities of gait and mobility: Secondary | ICD-10-CM

## 2022-03-27 DIAGNOSIS — R278 Other lack of coordination: Secondary | ICD-10-CM

## 2022-03-27 DIAGNOSIS — M6281 Muscle weakness (generalized): Secondary | ICD-10-CM

## 2022-03-27 DIAGNOSIS — Z9181 History of falling: Secondary | ICD-10-CM | POA: Diagnosis not present

## 2022-03-27 DIAGNOSIS — R2681 Unsteadiness on feet: Secondary | ICD-10-CM | POA: Diagnosis not present

## 2022-03-27 NOTE — Therapy (Signed)
OUTPATIENT PHYSICAL THERAPY TREATMENT NOTE   Patient Name: Virginia Luna MRN: [id number] DOB:May 27, 1961, 61 y.o., female Today's Date: 03/27/2022  PCP: Shirline Frees, MD REFERRING PROVIDER: Courtney Heys, MD  END OF SESSION:   PT End of Session - 03/27/22 1533     Visit Number 17    Number of Visits 21    Date for PT Re-Evaluation 05/04/22   pushed out for scheduling   Authorization Type [employer]    Progress Note Due on Visit 20    PT Start Time 1530    PT Stop Time 1615    PT Time Calculation (min) 45 min    Equipment Utilized During Treatment Gait belt    Activity Tolerance Patient tolerated treatment well    Behavior During Therapy WFL for tasks assessed/performed               Past Medical History:  Diagnosis Date   Allergies    Anemia    Arthritis    Asthma    Back pain    Chronic pain    Complication of anesthesia    woke up during colonoscopy   Diabetes ([place])    Diabetes mellitus without complication (HCC)    Edema, lower extremity    Fibroid    Fibromyalgia    Chronic   GERD (gastroesophageal reflux disease)    Headache    migraines   High blood pressure    History of hiatal hernia    History of stomach ulcers    IBS (irritable bowel syndrome)    Joint pain    Sleep apnea    did use cpap-lost 25lb-says she does not need it   NO CPAP   Wears contact lenses    Past Surgical History:  Procedure Laterality Date   BRAIN SURGERY Left 11/07/2021   [facility] Dr. Herschel [place]   BREAST BIOPSY     BREAST EXCISIONAL BIOPSY     BREAST LUMPECTOMY WITH RADIOACTIVE SEED LOCALIZATION Bilateral 11/24/2014   Procedure: BILATERAL BREAST LUMPECTOMY WITH RADIOACTIVE SEED LOCALIZATION;  Surgeon: [name], MD;  Location: [facility];  Service: General;  Laterality: Bilateral;   CHOLECYSTECTOMY     COLONOSCOPY     DILATION AND CURETTAGE OF UTERUS     ESOPHAGOGASTRODUODENOSCOPY (EGD) WITH PROPOFOL N/A 07/23/2016   Procedure:  ESOPHAGOGASTRODUODENOSCOPY (EGD) WITH PROPOFOL;  Surgeon: Laurence Spates, MD;  [facility];  Service: Endoscopy;  Laterality: N/A;   ESOPHAGOGASTRODUODENOSCOPY (EGD) WITH PROPOFOL N/A 06/11/2018   Procedure: ESOPHAGOGASTRODUODENOSCOPY (EGD) WITH PROPOFOL;  Surgeon: Laurence Spates, MD;  [facility]: [facility];  Service: Endoscopy;  Laterality: N/A;   LUMBAR LAMINECTOMY  2010   ORIF WRIST FRACTURE Right 11/24/2020   Procedure: OPEN REDUCTION INTERNAL FIXATION RIGHT WRIST FRACTURE;  Surgeon: Renette Butters, MD;  Location: [facility];  Service: Orthopedics;  Laterality: Right;   UMBILICAL HERNIA REPAIR     age 84   UPPER GI ENDOSCOPY     Patient Active Problem List   Diagnosis Date Noted   Cerebellar tumor ([place]) 11/15/2021   Comorbid sleep-related hypoventilation 10/19/2021   Urinary incontinence/areflexic bladder 10/05/2021   OSA, unable to tolerate CPAP (vomited every night) 09/05/2021   Chronic pain syndrome 09/05/2021   Chronic nausea 09/05/2021   Neurogenic bladder, followed by Urology, instructed I&O cath BID 09/05/2021   Intolerance of continuous positive airway pressure (CPAP) ventilation 09/03/2021   Insomnia secondary to chronic pain 07/24/2021   Primary osteoarthritis of right knee 07/03/2021   Myofascial  pain dysfunction syndrome 07/03/2021   Fibromyalgia 07/03/2021   Hyponatremia 07/03/2021   Primary osteoarthritis of left knee 07/03/2021   Body mass index (BMI) 50.0-59.9, adult ([place]) 03/07/2021   Allergic rhinitis due to animal (cat) (dog) hair and dander 10/06/2020   Allergic rhinitis due to pollen 10/06/2020   Moderate persistent asthma, uncomplicated 16/05/9603   Cerebellar mass 10/06/2020   Paresthesia 05/27/2020   Diabetes mellitus ([place]) 05/25/2020   Hypertension associated with diabetes ([place]) 05/25/2020   Hyperlipidemia associated with type 2 diabetes mellitus ([place]) 05/25/2020   Vitamin D deficiency 05/25/2020   NAFLD (nonalcoholic fatty liver disease)  05/25/2020   Sciatica of left side 05/25/2020    REFERRING DIAG: D49.6 (ICD-10-CM) - Neoplasm of unspecified behavior of brain    THERAPY DIAG:  Other abnormalities of gait and mobility  Unsteadiness on feet  Muscle weakness (generalized)  Other lack of coordination  PERTINENT HISTORY: From H&P note on 11/15/2021:  "On the day of admission 11/07/2021, the patient was taken to the operating room by Dr. Recardo Evangelist of neurosurgery and underwent left suboccipital craniotomy, resection of cerebellar tumor. She tolerated the procedure well. Postoperative MRI performed on 3/22 revealed a small area of diffusion restriction in the left cerebellum."  Patient transferred to CIR on 11/15/2021, discharged 11/24/2021 per pt.  fibromyalgia, DM2, asthma, HTN, OSA, migraines, bilateral knee OA, myofascial pain syndrome    PRECAUTIONS: Fall  SUBJECTIVE: Appt with behavioral health on June 14, 2022.  Pt states she is fine. No acute changes or falls, but has had 1 near miss when she felt dizzy and her legs felt weak.  PAIN:  Are you having pain? Yes: NPRS scale: 8/10 Pain location: All over (fibromyalgia), worse in head and neck and BLE (LLE worse)  -base and sides of head  VITALS (LUE): There were no vitals filed for this visit.   OBJECTIVE:    DIAGNOSTIC FINDINGS: From Brain MRI on 09/02/2021:  "Increased size of left superior cerebellar vermis mass, now measuring 1.5 x 1.5 cm, previously 1.1 x 1.0 cm. Increased mild surrounding edema."  Future brain MRI ordered w/o date visible at this time.   COGNITION: Overall cognitive status: No family/caregiver present to determine baseline cognitive functioning - pt states "I feel stupid", she elaborates that she is having a hard time processing.     TODAY'S TREATMENT:  -PT provides therapeutic listening due to pt being tearful and angry about some interpersonal ongoing issues since current medical episode.  Initiated session with conversation about  realistic progress and current functional progress with encouragement provided as appropriate. -Treadmill training at 1.0 mph on incline cycle from 0-2% over 8 minutes, pt maintains good upright throughout. -6" fwd step ups w/ BUE support x20 alt LE -Backwards walking x25' using tactile cue to increase pace of walking > additional 25' w/ min cuing inc step size -STS x10 w/ 5# dumbbell pressup bilaterally -STS w/ 10# kettlebell hold x10 -Lifting 5# > 10# crate from floor to chest height to simulate home chores and lifting technique  PATIENT EDUCATION: Education details:  Continue HEP and walking program. Person educated: Patient Education method: Explanation  Education comprehension: verbalized understanding and needs further education     HOME EXERCISE PROGRAM: Access Code: [email or web address] URL: [email or web address] Date: 01/16/2022 Prepared by: Elease Etienne  Exercises - Sit to Stand Without Arm Support  - 1 x daily - 7 x weekly - 3 sets - 10 reps - Standing March with Counter Support  - 1 x daily -  7 x weekly - 3 sets - 10 reps - Chin Tuck  - 1 x daily - 7 x weekly - 3 sets - 10 reps - Seated Cervical Sidebending Stretch  - 1 x daily - 7 x weekly - 3 sets - 10 reps - Supine Bridge  - 1 x daily - 4 x weekly - 3 sets - 10 reps - Supine Active Straight Leg Raise  - 1 x daily - 4 x weekly - 2 sets - 8 reps - Forward and Backward Monster Walk with Counter Support  - 1 x daily - 4 x weekly - 3 sets - 10 reps - Seated Heel Raise  - 1 x daily - 4 x weekly - 2 sets - 12 reps - Single Leg Stance on Pillow  - 1 x daily - 4 x weekly - 1 sets - 4 reps - Heel Raises with Counter Support  - 1 x daily - 4 x weekly - 2 sets - 8 reps - Sidelying ITB Stretch off Table  - 1 x daily - 4 x weekly - 1 sets - 2 reps - 45 seconds hold  Initiated walking program 01/30/2022:  Walk in home 2 mins at a time, 5x per day with the RW or rollator.    Seated stepper (pt states she has one that she  would like to use) x8-10 minutes in chair with back support to maintain posture, progress time as able by increments of 1-2 minutes a week.  GOALS:  SHORT TERM GOALS:   Target date: 03/23/2022  Walking program will be progressed with pt compliant to promote aerobic gains and activity tolerance. Baseline: Established with pt compliant, needs progression. Goal status: MET  2.  Pt will increase BERG balance score to 50/56 to demonstrate improved static balance. Baseline: 46/56; 03/23/2022 48/56 Goal status: NOT MET  3.  Pt will improve FGA score to >/=15/30 in order to demonstrate improved balance and decreased fall risk. Baseline: 11/30; 03/23/2022 17/30 Goal status: MET  4.  PT to initiate stair training with pt able to negotiate 4 stairs using BUE support and step-to pattern to promote safe independence in home environment. Baseline: 8 stairs w/ Bil rails alt pattern SBA Goal status: MET  LONG TERM GOALS:  Target date: 04/20/2022  Pt will tolerate >/=8 minutes of continuous aerobic exercise to promote improved activity tolerance. Baseline: 5 minutes. Goal status: INITIAL  2.  Pt will ambulate >/=500' on various level and unlevel surfaces including ramped surface w/ LRAD and supervision to improve safe access to community environment. Baseline: limited to level surfaces w/ RW Goal status: INITIAL  3.  Pt will manage 8 stairs w/ unilateral rail alternating pattern at supervision level to promote safe independence in home and community environments. Baseline: 8 stairs w/ Bil rails alt pattern SBA Goal status: REVISED  4.  Pt will improve FGA score to >/=20/30 in order to demonstrate improved balance and decreased fall risk. Baseline: 11/30; 03/23/2022 17/30 Goal status: INITIAL  ASSESSMENT:   CLINICAL IMPRESSION: Pt continues to progress well and remains motivated to participate in PT despite ongoing lability and tearfulness.  Posture is markedly improved this session with patient  maintaining upright throughout all dynamic activities in session with minimal cuing.  She continues to fatigue with prolonged ambulation and tolerates progression to incline without issue.  Progressed session to lifting and use of increase weight to facilitate anterior weight shift, stability in standing, and core engagement to tasks.  Pt is overall  progressing towards LTGs.  OBJECTIVE IMPAIRMENTS Abnormal gait, decreased activity tolerance, decreased balance, decreased cognition, decreased coordination, decreased endurance, decreased knowledge of condition, decreased knowledge of use of DME, decreased mobility, difficulty walking, decreased ROM, decreased strength, and decreased safety awareness.    ACTIVITY LIMITATIONS cleaning, community activity, driving, meal prep, laundry, and shopping.    PERSONAL FACTORS Age, Behavior pattern, Fitness, and 3+ comorbidities: fibromyalgia, DM2, asthma, HTN, OSA, migraines, bilateral knee OA, myofascial pain syndrome  are also affecting patient's functional outcome.      REHAB POTENTIAL: Fair hx of fibromyalgia, DM2, asthma, HTN, OSA, migraines, bilateral knee OA, myofascial pain syndrome , personal factors   CLINICAL DECISION MAKING: Evolving/moderate complexity   EVALUATION COMPLEXITY: Moderate   PLAN: PT FREQUENCY: 1x/week   PT DURATION: 8 weeks (re-cert until 2/70/7867 due to scheduling)   PLANNED INTERVENTIONS: Therapeutic exercises, Therapeutic activity, Neuromuscular re-education, Balance training, Gait training, Patient/Family education, Joint mobilization, Stair training, Vestibular training, DME instructions, Manual therapy, and self care and home management   PLAN FOR NEXT SESSION:  Outdoor ambulation w/ and w/o rollator, balance.  SciFit.  Gait w/ rollator vs w/o.  Modify HEP prn.  Endurance, BLE strength-quad strengthening, circuit training-balance.     Bary Richard, PT, DPT 03/27/2022, 4:31 PM

## 2022-03-28 ENCOUNTER — Encounter (INDEPENDENT_AMBULATORY_CARE_PROVIDER_SITE_OTHER): Payer: Self-pay

## 2022-03-28 DIAGNOSIS — J3089 Other allergic rhinitis: Secondary | ICD-10-CM | POA: Diagnosis not present

## 2022-03-28 DIAGNOSIS — J301 Allergic rhinitis due to pollen: Secondary | ICD-10-CM | POA: Diagnosis not present

## 2022-03-28 DIAGNOSIS — J3081 Allergic rhinitis due to animal (cat) (dog) hair and dander: Secondary | ICD-10-CM | POA: Diagnosis not present

## 2022-03-29 ENCOUNTER — Ambulatory Visit: Payer: Medicare HMO | Admitting: Physical Therapy

## 2022-03-30 ENCOUNTER — Encounter: Payer: Medicare HMO | Admitting: Physical Medicine and Rehabilitation

## 2022-03-31 DIAGNOSIS — J0110 Acute frontal sinusitis, unspecified: Secondary | ICD-10-CM | POA: Diagnosis not present

## 2022-04-04 DIAGNOSIS — J3089 Other allergic rhinitis: Secondary | ICD-10-CM | POA: Diagnosis not present

## 2022-04-04 DIAGNOSIS — J3081 Allergic rhinitis due to animal (cat) (dog) hair and dander: Secondary | ICD-10-CM | POA: Diagnosis not present

## 2022-04-04 DIAGNOSIS — J301 Allergic rhinitis due to pollen: Secondary | ICD-10-CM | POA: Diagnosis not present

## 2022-04-05 DIAGNOSIS — E118 Type 2 diabetes mellitus with unspecified complications: Secondary | ICD-10-CM | POA: Diagnosis not present

## 2022-04-05 DIAGNOSIS — Z6841 Body Mass Index (BMI) 40.0 and over, adult: Secondary | ICD-10-CM | POA: Diagnosis not present

## 2022-04-05 DIAGNOSIS — Z7985 Long-term (current) use of injectable non-insulin antidiabetic drugs: Secondary | ICD-10-CM | POA: Diagnosis not present

## 2022-04-05 DIAGNOSIS — G894 Chronic pain syndrome: Secondary | ICD-10-CM | POA: Diagnosis not present

## 2022-04-05 DIAGNOSIS — R42 Dizziness and giddiness: Secondary | ICD-10-CM | POA: Diagnosis not present

## 2022-04-06 ENCOUNTER — Ambulatory Visit: Payer: Medicare HMO | Admitting: Physical Therapy

## 2022-04-06 ENCOUNTER — Encounter: Payer: Self-pay | Admitting: Physical Therapy

## 2022-04-06 DIAGNOSIS — R278 Other lack of coordination: Secondary | ICD-10-CM

## 2022-04-06 DIAGNOSIS — R2681 Unsteadiness on feet: Secondary | ICD-10-CM | POA: Diagnosis not present

## 2022-04-06 DIAGNOSIS — I10 Essential (primary) hypertension: Secondary | ICD-10-CM | POA: Diagnosis not present

## 2022-04-06 DIAGNOSIS — Z9181 History of falling: Secondary | ICD-10-CM | POA: Diagnosis not present

## 2022-04-06 DIAGNOSIS — E7800 Pure hypercholesterolemia, unspecified: Secondary | ICD-10-CM | POA: Diagnosis not present

## 2022-04-06 DIAGNOSIS — M6281 Muscle weakness (generalized): Secondary | ICD-10-CM

## 2022-04-06 DIAGNOSIS — E119 Type 2 diabetes mellitus without complications: Secondary | ICD-10-CM | POA: Diagnosis not present

## 2022-04-06 DIAGNOSIS — R2689 Other abnormalities of gait and mobility: Secondary | ICD-10-CM | POA: Diagnosis not present

## 2022-04-06 NOTE — Therapy (Signed)
OUTPATIENT PHYSICAL THERAPY TREATMENT NOTE   Patient Name: Virginia Luna MRN: [id number] DOB:June 19, 1961, 61 y.o., female Today's Date: 04/06/2022  PCP: Shirline Frees, MD REFERRING PROVIDER: Courtney Heys, MD  END OF SESSION:   PT End of Session - 04/06/22 1319     Visit Number 18    Number of Visits 21    Date for PT Re-Evaluation 05/04/22   pushed out for scheduling   Authorization Type [employer]    Progress Note Due on Visit 20    PT Start Time 1314    PT Stop Time 1400    PT Time Calculation (min) 46 min    Equipment Utilized During Treatment Gait belt    Activity Tolerance Patient tolerated treatment well    Behavior During Therapy WFL for tasks assessed/performed               Past Medical History:  Diagnosis Date   Allergies    Anemia    Arthritis    Asthma    Back pain    Chronic pain    Complication of anesthesia    woke up during colonoscopy   Diabetes ([place])    Diabetes mellitus without complication (HCC)    Edema, lower extremity    Fibroid    Fibromyalgia    Chronic   GERD (gastroesophageal reflux disease)    Headache    migraines   High blood pressure    History of hiatal hernia    History of stomach ulcers    IBS (irritable bowel syndrome)    Joint pain    Sleep apnea    did use cpap-lost 25lb-says she does not need it   NO CPAP   Wears contact lenses    Past Surgical History:  Procedure Laterality Date   BRAIN SURGERY Left 11/07/2021   [facility] Dr. Herschel [place]   BREAST BIOPSY     BREAST EXCISIONAL BIOPSY     BREAST LUMPECTOMY WITH RADIOACTIVE SEED LOCALIZATION Bilateral 11/24/2014   Procedure: BILATERAL BREAST LUMPECTOMY WITH RADIOACTIVE SEED LOCALIZATION;  Surgeon: [name], MD;  Location: [facility];  Service: General;  Laterality: Bilateral;   CHOLECYSTECTOMY     COLONOSCOPY     DILATION AND CURETTAGE OF UTERUS     ESOPHAGOGASTRODUODENOSCOPY (EGD) WITH PROPOFOL N/A 07/23/2016   Procedure:  ESOPHAGOGASTRODUODENOSCOPY (EGD) WITH PROPOFOL;  Surgeon: Laurence Spates, MD;  [facility];  Service: Endoscopy;  Laterality: N/A;   ESOPHAGOGASTRODUODENOSCOPY (EGD) WITH PROPOFOL N/A 06/11/2018   Procedure: ESOPHAGOGASTRODUODENOSCOPY (EGD) WITH PROPOFOL;  Surgeon: Laurence Spates, MD;  [facility];  Service: Endoscopy;  Laterality: N/A;   LUMBAR LAMINECTOMY  2010   ORIF WRIST FRACTURE Right 11/24/2020   Procedure: OPEN REDUCTION INTERNAL FIXATION RIGHT WRIST FRACTURE;  Surgeon: Renette Butters, MD;  [facility]: [facility];  Service: Orthopedics;  Laterality: Right;   UMBILICAL HERNIA REPAIR     age 33   UPPER GI ENDOSCOPY     Patient Active Problem List   Diagnosis Date Noted   Cerebellar tumor ([place]) 11/15/2021   Comorbid sleep-related hypoventilation 10/19/2021   Urinary incontinence/areflexic bladder 10/05/2021   OSA, unable to tolerate CPAP (vomited every night) 09/05/2021   Chronic pain syndrome 09/05/2021   Chronic nausea 09/05/2021   Neurogenic bladder, followed by Urology, instructed I&O cath BID 09/05/2021   Intolerance of continuous positive airway pressure (CPAP) ventilation 09/03/2021   Insomnia secondary to chronic pain 07/24/2021   Primary osteoarthritis of right knee 07/03/2021   Myofascial  pain dysfunction syndrome 07/03/2021   Fibromyalgia 07/03/2021   Hyponatremia 07/03/2021   Primary osteoarthritis of left knee 07/03/2021   Body mass index (BMI) 50.0-59.9, adult ([place]) 03/07/2021   Allergic rhinitis due to animal (cat) (dog) hair and dander 10/06/2020   Allergic rhinitis due to pollen 10/06/2020   Moderate persistent asthma, uncomplicated 24/40/1027   Cerebellar mass 10/06/2020   Paresthesia 05/27/2020   Diabetes mellitus ([place]) 05/25/2020   Hypertension associated with diabetes ([place]) 05/25/2020   Hyperlipidemia associated with type 2 diabetes mellitus ([place]) 05/25/2020   Vitamin D deficiency 05/25/2020   NAFLD (nonalcoholic fatty liver disease)  05/25/2020   Sciatica of left side 05/25/2020    REFERRING DIAG: D49.6 (ICD-10-CM) - Neoplasm of unspecified behavior of brain    THERAPY DIAG:  Other abnormalities of gait and mobility  Unsteadiness on feet  Muscle weakness (generalized)  Other lack of coordination  PERTINENT HISTORY: From H&P note on 11/15/2021:  "On the day of admission 11/07/2021, the patient was taken to the operating room by Dr. Recardo Evangelist of neurosurgery and underwent left suboccipital craniotomy, resection of cerebellar tumor. She tolerated the procedure well. Postoperative MRI performed on 3/22 revealed a small area of diffusion restriction in the left cerebellum."  Patient transferred to CIR on 11/15/2021, discharged 11/24/2021 per pt.  fibromyalgia, DM2, asthma, HTN, OSA, migraines, bilateral knee OA, myofascial pain syndrome    PRECAUTIONS: Fall  SUBJECTIVE: Pt denies falls or recent near misses.  PAIN:  Are you having pain? Yes: NPRS scale: 7.5/10 Pain location: All over (fibromyalgia), worse in head and neck and BLE (LLE worse)  -base and sides of head  OBJECTIVE:    DIAGNOSTIC FINDINGS: From Brain MRI on 09/02/2021:  "Increased size of left superior cerebellar vermis mass, now measuring 1.5 x 1.5 cm, previously 1.1 x 1.0 cm. Increased mild surrounding edema."  Future brain MRI ordered w/o date visible at this time.   COGNITION: Overall cognitive status: No family/caregiver present to determine baseline cognitive functioning - pt states "I feel stupid", she elaborates that she is having a hard time processing.     TODAY'S TREATMENT:   GAIT: Gait pattern: step through pattern, decreased step length- Right, decreased step length- Left, decreased stride length, decreased hip/knee flexion- Right, decreased hip/knee flexion- Left, and wide BOS Distance walked: 500' Assistive device utilized: Environmental consultant - 4 wheeled Level of assistance: SBA Comments: Pt ambulates over unlevel sidewalk and through grass at  close supervision/SBA level w/o LOB.  She demonstrates improved heel strike, still diminished compared to normal, but requires no cuing for improvement.  Pt fatigued following task, provided prolonged seated rest.  Pt stands on grass EO > EC 2x30 sec > alt semi-tandem w/ inc sway w/ RLE in rear holding 2x30sec each Pt ambulates fwds/retro w/ intermittent CGA 6x8' in loose gravel w/o AD SciFit x51mins L4.0-5.0 using BUE/BLE for reciprocal mobility and global strength gains.  Step goal of 85 steps/min w/ 710 total steps achieved.  PATIENT EDUCATION: Education details:  Continue HEP and walking program.  Edu on discharge plan and plan for next visit.  Edu on continued  Person educated: Patient Education method: Explanation  Education comprehension: verbalized understanding and needs further education     HOME EXERCISE PROGRAM: Access Code: [email or web address] URL: [email or web address] Date: 01/16/2022 Prepared by: Elease Etienne  Exercises - Sit to Stand Without Arm Support  - 1 x daily - 7 x weekly - 3 sets - 10 reps - Standing March with Counter Support  - 1  x daily - 7 x weekly - 3 sets - 10 reps - Chin Tuck  - 1 x daily - 7 x weekly - 3 sets - 10 reps - Seated Cervical Sidebending Stretch  - 1 x daily - 7 x weekly - 3 sets - 10 reps - Supine Bridge  - 1 x daily - 4 x weekly - 3 sets - 10 reps - Supine Active Straight Leg Raise  - 1 x daily - 4 x weekly - 2 sets - 8 reps - Forward and Backward Monster Walk with Counter Support  - 1 x daily - 4 x weekly - 3 sets - 10 reps - Seated Heel Raise  - 1 x daily - 4 x weekly - 2 sets - 12 reps - Single Leg Stance on Pillow  - 1 x daily - 4 x weekly - 1 sets - 4 reps - Heel Raises with Counter Support  - 1 x daily - 4 x weekly - 2 sets - 8 reps - Sidelying ITB Stretch off Table  - 1 x daily - 4 x weekly - 1 sets - 2 reps - 45 seconds hold  Initiated walking program 01/30/2022:  Walk in home 2 mins at a time, 5x per day with the RW or  rollator.    Seated stepper (pt states she has one that she would like to use) x8-10 minutes in chair with back support to maintain posture, progress time as able by increments of 1-2 minutes a week.  GOALS:  SHORT TERM GOALS:   Target date: 03/23/2022  Walking program will be progressed with pt compliant to promote aerobic gains and activity tolerance. Baseline: Established with pt compliant, needs progression. Goal status: MET  2.  Pt will increase BERG balance score to 50/56 to demonstrate improved static balance. Baseline: 46/56; 03/23/2022 48/56 Goal status: NOT MET  3.  Pt will improve FGA score to >/=15/30 in order to demonstrate improved balance and decreased fall risk. Baseline: 11/30; 03/23/2022 17/30 Goal status: MET  4.  PT to initiate stair training with pt able to negotiate 4 stairs using BUE support and step-to pattern to promote safe independence in home environment. Baseline: 8 stairs w/ Bil rails alt pattern SBA Goal status: MET  LONG TERM GOALS:  Target date: 04/20/2022  Pt will tolerate >/=8 minutes of continuous aerobic exercise to promote improved activity tolerance. Baseline: 5 minutes. Goal status: INITIAL  2.  Pt will ambulate >/=500' on various level and unlevel surfaces including ramped surface w/ LRAD and supervision to improve safe access to community environment. Baseline: limited to level surfaces w/ RW Goal status: INITIAL  3.  Pt will manage 8 stairs w/ unilateral rail alternating pattern at supervision level to promote safe independence in home and community environments. Baseline: 8 stairs w/ Bil rails alt pattern SBA Goal status: REVISED  4.  Pt will improve FGA score to >/=20/30 in order to demonstrate improved balance and decreased fall risk. Baseline: 11/30; 03/23/2022 17/30 Goal status: INITIAL  ASSESSMENT:   CLINICAL IMPRESSION: Pt is progressing well towards goals tolerating 500' of outdoor unlevel ambulation over sidewalk and grassy  surfaces with use of rollator which is safest for patient at current functional level.  Discussed upcoming plan for discharge with discussion of lingering things to address.  Pt continues to benefit from skilled PT to progress overall endurance and safety with upright mobility.  OBJECTIVE IMPAIRMENTS Abnormal gait, decreased activity tolerance, decreased balance, decreased cognition, decreased coordination, decreased endurance,  decreased knowledge of condition, decreased knowledge of use of DME, decreased mobility, difficulty walking, decreased ROM, decreased strength, and decreased safety awareness.    ACTIVITY LIMITATIONS cleaning, community activity, driving, meal prep, laundry, and shopping.    PERSONAL FACTORS Age, Behavior pattern, Fitness, and 3+ comorbidities: fibromyalgia, DM2, asthma, HTN, OSA, migraines, bilateral knee OA, myofascial pain syndrome  are also affecting patient's functional outcome.      REHAB POTENTIAL: Fair hx of fibromyalgia, DM2, asthma, HTN, OSA, migraines, bilateral knee OA, myofascial pain syndrome , personal factors   CLINICAL DECISION MAKING: Evolving/moderate complexity   EVALUATION COMPLEXITY: Moderate   PLAN: PT FREQUENCY: 1x/week   PT DURATION: 8 weeks (re-cert until 0/50/2561 due to scheduling)   PLANNED INTERVENTIONS: Therapeutic exercises, Therapeutic activity, Neuromuscular re-education, Balance training, Gait training, Patient/Family education, Joint mobilization, Stair training, Vestibular training, DME instructions, Manual therapy, and self care and home management   PLAN FOR NEXT SESSION:  SciFit.  Gait w/o rollator-address bouncing/fwd lean.  Review/update HEP-pt requests HEP re-print.  circuit training-balance.     Bary Richard, PT, DPT 04/06/2022, 2:03 PM

## 2022-04-11 ENCOUNTER — Other Ambulatory Visit: Payer: Self-pay | Admitting: Physical Medicine and Rehabilitation

## 2022-04-11 DIAGNOSIS — J3081 Allergic rhinitis due to animal (cat) (dog) hair and dander: Secondary | ICD-10-CM | POA: Diagnosis not present

## 2022-04-11 DIAGNOSIS — J301 Allergic rhinitis due to pollen: Secondary | ICD-10-CM | POA: Diagnosis not present

## 2022-04-11 DIAGNOSIS — J3089 Other allergic rhinitis: Secondary | ICD-10-CM | POA: Diagnosis not present

## 2022-04-13 ENCOUNTER — Encounter: Payer: Self-pay | Admitting: Physical Therapy

## 2022-04-13 ENCOUNTER — Ambulatory Visit: Payer: Medicare HMO | Admitting: Physical Therapy

## 2022-04-13 DIAGNOSIS — R600 Localized edema: Secondary | ICD-10-CM | POA: Diagnosis not present

## 2022-04-13 DIAGNOSIS — J4520 Mild intermittent asthma, uncomplicated: Secondary | ICD-10-CM | POA: Diagnosis not present

## 2022-04-13 DIAGNOSIS — R2689 Other abnormalities of gait and mobility: Secondary | ICD-10-CM | POA: Diagnosis not present

## 2022-04-13 DIAGNOSIS — M6281 Muscle weakness (generalized): Secondary | ICD-10-CM

## 2022-04-13 DIAGNOSIS — R278 Other lack of coordination: Secondary | ICD-10-CM | POA: Diagnosis not present

## 2022-04-13 DIAGNOSIS — R2681 Unsteadiness on feet: Secondary | ICD-10-CM | POA: Diagnosis not present

## 2022-04-13 DIAGNOSIS — Z9181 History of falling: Secondary | ICD-10-CM | POA: Diagnosis not present

## 2022-04-13 DIAGNOSIS — E119 Type 2 diabetes mellitus without complications: Secondary | ICD-10-CM | POA: Diagnosis not present

## 2022-04-13 DIAGNOSIS — E7800 Pure hypercholesterolemia, unspecified: Secondary | ICD-10-CM | POA: Diagnosis not present

## 2022-04-13 DIAGNOSIS — G43909 Migraine, unspecified, not intractable, without status migrainosus: Secondary | ICD-10-CM | POA: Diagnosis not present

## 2022-04-13 DIAGNOSIS — M797 Fibromyalgia: Secondary | ICD-10-CM | POA: Diagnosis not present

## 2022-04-13 DIAGNOSIS — I10 Essential (primary) hypertension: Secondary | ICD-10-CM | POA: Diagnosis not present

## 2022-04-13 DIAGNOSIS — G894 Chronic pain syndrome: Secondary | ICD-10-CM | POA: Diagnosis not present

## 2022-04-13 DIAGNOSIS — D649 Anemia, unspecified: Secondary | ICD-10-CM | POA: Diagnosis not present

## 2022-04-13 NOTE — Therapy (Signed)
OUTPATIENT PHYSICAL THERAPY TREATMENT NOTE   Patient Name: Virginia [facility] MRN: [id number] DOB:January 25, 1961, 61 y.o., female Today's Date: 04/13/2022  PCP: Virginia Blamer, MD REFERRING PROVIDER: Genice Rouge, MD  END OF SESSION:   PT End of Session - 04/13/22 0939     Visit Number 19    Number of Visits 21    Date for PT Re-Evaluation 05/04/22   pushed out for scheduling   Authorization Type [employer]    Progress Note Due on Visit 20    PT Start Time 0935   pt late   PT Stop Time 1017    PT Time Calculation (min) 42 min    Equipment Utilized During Treatment --    Activity Tolerance Patient tolerated treatment well    Behavior During Therapy WFL for tasks assessed/performed               Past Medical History:  Diagnosis Date   Allergies    Anemia    Arthritis    Asthma    Back pain    Chronic pain    Complication of anesthesia    woke up during colonoscopy   Diabetes (HCC)    Diabetes mellitus without complication (HCC)    Edema, lower extremity    Fibroid    Fibromyalgia    Chronic   GERD (gastroesophageal reflux disease)    Headache    migraines   High blood pressure    History of hiatal hernia    History of stomach ulcers    IBS (irritable bowel syndrome)    Joint pain    Sleep apnea    did use cpap-lost 25lb-says she does not need it   NO CPAP   Wears contact lenses    Past Surgical History:  Procedure Laterality Date   BRAIN SURGERY Left 11/07/2021   [facility] Dr. Clydene [facility]   BREAST BIOPSY     BREAST EXCISIONAL BIOPSY     BREAST LUMPECTOMY WITH RADIOACTIVE SEED LOCALIZATION Bilateral 11/24/2014   Procedure: BILATERAL BREAST LUMPECTOMY WITH RADIOACTIVE SEED LOCALIZATION;  Surgeon: Virginia Bouillon, MD;  Location: [facility];  Service: General;  Laterality: Bilateral;   CHOLECYSTECTOMY     COLONOSCOPY     DILATION AND CURETTAGE OF UTERUS     ESOPHAGOGASTRODUODENOSCOPY (EGD) WITH PROPOFOL N/A 07/23/2016   Procedure:  ESOPHAGOGASTRODUODENOSCOPY (EGD) WITH PROPOFOL;  Surgeon: Virginia Ching, MD;  Location: [facility];  Service: Endoscopy;  Laterality: N/A;   ESOPHAGOGASTRODUODENOSCOPY (EGD) WITH PROPOFOL N/A 06/11/2018   Procedure: ESOPHAGOGASTRODUODENOSCOPY (EGD) WITH PROPOFOL;  Surgeon: Virginia Ching, MD;  Location: [facility];  Service: Endoscopy;  Laterality: N/A;   LUMBAR LAMINECTOMY  2010   ORIF WRIST FRACTURE Right 11/24/2020   Procedure: OPEN REDUCTION INTERNAL FIXATION RIGHT WRIST FRACTURE;  Surgeon: Virginia Apley, MD;  Location: [facility];  Service: Orthopedics;  Laterality: Right;   UMBILICAL HERNIA REPAIR     age 42   UPPER GI ENDOSCOPY     Patient Active Problem List   Diagnosis Date Noted   Cerebellar tumor (HCC) 11/15/2021   Comorbid sleep-related hypoventilation 10/19/2021   Urinary incontinence/areflexic bladder 10/05/2021   OSA, unable to tolerate CPAP (vomited every night) 09/05/2021   Chronic pain syndrome 09/05/2021   Chronic nausea 09/05/2021   Neurogenic bladder, followed by Urology, instructed I&O cath BID 09/05/2021   Intolerance of continuous positive airway pressure (CPAP) ventilation 09/03/2021   Insomnia secondary to chronic pain 07/24/2021   Primary osteoarthritis of right knee 07/03/2021  Myofascial pain dysfunction syndrome 07/03/2021   Fibromyalgia 07/03/2021   Hyponatremia 07/03/2021   Primary osteoarthritis of left knee 07/03/2021   Body mass index (BMI) 50.0-59.9, adult ([facility]) 03/07/2021   Allergic rhinitis due to animal (cat) (dog) hair and dander 10/06/2020   Allergic rhinitis due to pollen 10/06/2020   Moderate persistent asthma, uncomplicated 80/32/1224   Cerebellar mass 10/06/2020   Paresthesia 05/27/2020   Diabetes mellitus ([place]) 05/25/2020   Hypertension associated with diabetes ([place]) 05/25/2020   Hyperlipidemia associated with type 2 diabetes mellitus ([place]) 05/25/2020   Vitamin D deficiency 05/25/2020   NAFLD (nonalcoholic fatty liver disease)  05/25/2020   Sciatica of left side 05/25/2020    REFERRING DIAG: D49.6 (ICD-10-CM) - Neoplasm of unspecified behavior of brain    THERAPY DIAG:  Other abnormalities of gait and mobility  Unsteadiness on feet  Muscle weakness (generalized)  Other lack of coordination  History of falling  PERTINENT HISTORY: From H&P note on 11/15/2021:  "On the day of admission 11/07/2021, the patient was taken to the operating room by Dr. Recardo [facility] of neurosurgery and underwent left suboccipital craniotomy, resection of cerebellar tumor. She tolerated the procedure well. Postoperative MRI performed on 3/22 revealed a small area of diffusion restriction in the left cerebellum."  Patient transferred to [place] on 11/15/2021, discharged 11/24/2021 per pt.  fibromyalgia, DM2, asthma, HTN, OSA, migraines, bilateral knee OA, myofascial pain syndrome    PRECAUTIONS: Fall  SUBJECTIVE:  Pt states she fell recently (unsure when-maybe Tuesday).  "I think I just got upset with [place] [facility] (her partner)."  Aside from this she is unable to elaborate as to how her fall occurred.  She states she pulled herself up on her walker.  PAIN:  Are you having pain? Yes: NPRS scale: 8/10 Pain location: Right side-ankle worst    OBJECTIVE:    DIAGNOSTIC FINDINGS: From Brain MRI on 09/02/2021:  "Increased size of left superior cerebellar vermis mass, now measuring 1.5 x 1.5 cm, previously 1.1 x 1.0 cm. Increased mild surrounding edema."  Future brain MRI ordered w/o date visible at this time.   COGNITION: Overall cognitive status: No family/caregiver present to determine baseline cognitive functioning - pt states "I feel stupid", she elaborates that she is having a hard time processing.     TODAY'S TREATMENT:  Pt mildly tearful at onset of session.  PT provides therapeutic listening and encouragement as appropriate.  Provided redirection to HEP review w/ reprint provided to pt.  Verbally reviewed exercises and answered modification  questions.  Circuit training x3: -10# kettlebell squats -lateral side stepping on foam beam x2 each round -boxing (stationary punches > jabs w/ uni step > crossover step w/ punch)  -Boxing w/ moving target, pt selects stepping strategy needed, target moves high/low/lateral, no LOB noted.  Discussed safe return to community activity and use of boxing pad pt has at home in seated then standing (with chair behind and supervision from partner).  PATIENT EDUCATION: Education details:  Continue HEP and walking program.  Edu on discharge plan for next visit.   Person educated: Patient Education method: Explanation  Education comprehension: verbalized understanding and needs further education     HOME EXERCISE PROGRAM: Access Code: [email or web address] URL: [email or web address] Date: 01/16/2022 Prepared by: Elease Etienne  Exercises - Sit to Stand Without Arm Support  - 1 x daily - 7 x weekly - 3 sets - 10 reps - Standing March with Counter Support  - 1 x daily - 7 x weekly - 3 sets -  10 reps - Chin Tuck  - 1 x daily - 7 x weekly - 3 sets - 10 reps - Seated Cervical Sidebending Stretch  - 1 x daily - 7 x weekly - 3 sets - 10 reps - Supine Bridge  - 1 x daily - 4 x weekly - 3 sets - 10 reps - Supine Active Straight Leg Raise  - 1 x daily - 4 x weekly - 2 sets - 8 reps - Forward and Backward Monster Walk with Counter Support  - 1 x daily - 4 x weekly - 3 sets - 10 reps - Seated Heel Raise  - 1 x daily - 4 x weekly - 2 sets - 12 reps - Single Leg Stance on Pillow  - 1 x daily - 4 x weekly - 1 sets - 4 reps - Heel Raises with Counter Support  - 1 x daily - 4 x weekly - 2 sets - 8 reps - Sidelying ITB Stretch off Table  - 1 x daily - 4 x weekly - 1 sets - 2 reps - 45 seconds hold  Initiated walking program 01/30/2022:  Walk in home 2 mins at a time, 5x per day with the RW or rollator.    Seated stepper (pt states she has one that she would like to use) x8-10 minutes in chair with back  support to maintain posture, progress time as able by increments of 1-2 minutes a week.  GOALS:  SHORT TERM GOALS:   Target date: 03/23/2022  Walking program will be progressed with pt compliant to promote aerobic gains and activity tolerance. Baseline: Established with pt compliant, needs progression. Goal status: MET  2.  Pt will increase BERG balance score to 50/56 to demonstrate improved static balance. Baseline: 46/56; 03/23/2022 48/56 Goal status: NOT MET  3.  Pt will improve FGA score to >/=15/30 in order to demonstrate improved balance and decreased fall risk. Baseline: 11/30; 03/23/2022 17/30 Goal status: MET  4.  PT to initiate stair training with pt able to negotiate 4 stairs using BUE support and step-to pattern to promote safe independence in home environment. Baseline: 8 stairs w/ Bil rails alt pattern SBA Goal status: MET  LONG TERM GOALS:  Target date: 04/20/2022  Pt will tolerate >/=8 minutes of continuous aerobic exercise to promote improved activity tolerance. Baseline: 5 minutes. Goal status: INITIAL  2.  Pt will ambulate >/=500' on various level and unlevel surfaces including ramped surface w/ LRAD and supervision to improve safe access to community environment. Baseline: limited to level surfaces w/ RW Goal status: INITIAL  3.  Pt will manage 8 stairs w/ unilateral rail alternating pattern at supervision level to promote safe independence in home and community environments. Baseline: 8 stairs w/ Bil rails alt pattern SBA Goal status: REVISED  4.  Pt will improve FGA score to >/=20/30 in order to demonstrate improved balance and decreased fall risk. Baseline: 11/30; 03/23/2022 17/30 Goal status: INITIAL  ASSESSMENT:   CLINICAL IMPRESSION: Focus of skilled session on progression of circuit training for overall strength and balance using powerful movement and stepping strategy.  Pt tolerates treatment well and demonstrates good improvement in general conditioning.   She is progressing well towards goals and is on track and agreeable to discharge next visit.  OBJECTIVE IMPAIRMENTS Abnormal gait, decreased activity tolerance, decreased balance, decreased cognition, decreased coordination, decreased endurance, decreased knowledge of condition, decreased knowledge of use of DME, decreased mobility, difficulty walking, decreased ROM, decreased strength, and decreased safety  awareness.    ACTIVITY LIMITATIONS cleaning, community activity, driving, meal prep, laundry, and shopping.    PERSONAL FACTORS Age, Behavior pattern, Fitness, and 3+ comorbidities: fibromyalgia, DM2, asthma, HTN, OSA, migraines, bilateral knee OA, myofascial pain syndrome  are also affecting patient's functional outcome.      REHAB POTENTIAL: Fair hx of fibromyalgia, DM2, asthma, HTN, OSA, migraines, bilateral knee OA, myofascial pain syndrome , personal factors   CLINICAL DECISION MAKING: Evolving/moderate complexity   EVALUATION COMPLEXITY: Moderate   PLAN: PT FREQUENCY: 1x/week   PT DURATION: 8 weeks (re-cert until 05/14/9323 due to scheduling)   PLANNED INTERVENTIONS: Therapeutic exercises, Therapeutic activity, Neuromuscular re-education, Balance training, Gait training, Patient/Family education, Joint mobilization, Stair training, Vestibular training, DME instructions, Manual therapy, and self care and home management   PLAN FOR NEXT SESSION:  [employer]. Assess LTGs-D/C!     Bary Richard, PT, DPT 04/13/2022, 4:41 PM

## 2022-04-18 DIAGNOSIS — J301 Allergic rhinitis due to pollen: Secondary | ICD-10-CM | POA: Diagnosis not present

## 2022-04-18 DIAGNOSIS — J3081 Allergic rhinitis due to animal (cat) (dog) hair and dander: Secondary | ICD-10-CM | POA: Diagnosis not present

## 2022-04-18 DIAGNOSIS — J3089 Other allergic rhinitis: Secondary | ICD-10-CM | POA: Diagnosis not present

## 2022-04-20 ENCOUNTER — Ambulatory Visit: Payer: Medicare HMO | Attending: Physician Assistant | Admitting: Physical Therapy

## 2022-04-20 ENCOUNTER — Encounter: Payer: Self-pay | Admitting: Physical Therapy

## 2022-04-20 VITALS — BP 131/72 | HR 96

## 2022-04-20 DIAGNOSIS — R2681 Unsteadiness on feet: Secondary | ICD-10-CM | POA: Insufficient documentation

## 2022-04-20 DIAGNOSIS — R278 Other lack of coordination: Secondary | ICD-10-CM | POA: Insufficient documentation

## 2022-04-20 DIAGNOSIS — M6281 Muscle weakness (generalized): Secondary | ICD-10-CM | POA: Diagnosis not present

## 2022-04-20 DIAGNOSIS — R2689 Other abnormalities of gait and mobility: Secondary | ICD-10-CM | POA: Diagnosis not present

## 2022-04-20 NOTE — Therapy (Signed)
OUTPATIENT PHYSICAL THERAPY TREATMENT NOTE/DISCHARGE SUMMARY/PROGRESS NOTE   Patient Name: Virginia Luna MRN: [id number] DOB:[birth year], 61 y.o., female Today's Date: 04/20/2022  PCP: Shirline Frees, MD REFERRING PROVIDER: Courtney Heys, MD  PHYSICAL THERAPY DISCHARGE SUMMARY  Visits from Start of Care: 20  Current functional level related to goals / functional outcomes: See clinical impression statement.   Remaining deficits: Requires rollator for safest mobility, ambulatory tolerance limited community distances   Education / Equipment: Discharge plan, continue HEP and regular walking.  Patient agrees to discharge. Patient goals were met. Patient is being discharged due to meeting the stated rehab goals.  PT progress note for [facility].  Reporting period 11/28/2021 to 04/20/2022  See Note below for Objective Data and Assessment of Progress/Goals  Thank you for the referral of this patient. Elease Etienne, PT, DPT  END OF SESSION:   PT End of Session - 04/20/22 0936     Visit Number 20    Number of Visits 21    Date for PT Re-Evaluation 05/04/22   pushed out for scheduling   Authorization Type [employer]IC[employer]    Progress Note Due on Visit 20    PT Start Time 0928    PT Stop Time 1013    PT Time Calculation (min) 45 min    Activity Tolerance Patient tolerated treatment well    Behavior During Therapy WFL for tasks assessed/performed               Past Medical History:  Diagnosis Date   Allergies    Anemia    Arthritis    Asthma    Back pain    Chronic pain    Complication of anesthesia    woke up during colonoscopy   Diabetes ([employer])    Diabetes mellitus without complication (HCC)    Edema, lower extremity    Fibroid    Fibromyalgia    Chronic   GERD (gastroesophageal reflux disease)    Headache    migraines   High blood pressure    History of hiatal hernia    History of stomach ulcers    IBS (irritable bowel syndrome)    Joint  pain    Sleep apnea    did use cpap-lost 25lb-says she does not need it   NO CPAP   Wears contact lenses    Past Surgical History:  Procedure Laterality Date   BRAIN SURGERY Left 11/07/2021   [facility] Dr. Herschel [place]   BREAST BIOPSY     BREAST EXCISIONAL BIOPSY     BREAST LUMPECTOMY WITH RADIOACTIVE SEED LOCALIZATION Bilateral 11/24/2014   Procedure: BILATERAL BREAST LUMPECTOMY WITH RADIOACTIVE SEED LOCALIZATION;  Surgeon: [name], MD;  Location: [facility];  Service: General;  Laterality: Bilateral;   CHOLECYSTECTOMY     COLONOSCOPY     DILATION AND CURETTAGE OF UTERUS     ESOPHAGOGASTRODUODENOSCOPY (EGD) WITH PROPOFOL N/A 07/23/2016   Procedure: ESOPHAGOGASTRODUODENOSCOPY (EGD) WITH PROPOFOL;  Surgeon: Laurence Spates, MD;  Location: [facility];  Service: Endoscopy;  Laterality: N/A;   ESOPHAGOGASTRODUODENOSCOPY (EGD) WITH PROPOFOL N/A 06/11/2018   Procedure: ESOPHAGOGASTRODUODENOSCOPY (EGD) WITH PROPOFOL;  Surgeon: Laurence Spates, MD;  Location: [facility];  Service: Endoscopy;  Laterality: N/A;   LUMBAR LAMINECTOMY  2010   ORIF WRIST FRACTURE Right 11/24/2020   Procedure: OPEN REDUCTION INTERNAL FIXATION RIGHT WRIST FRACTURE;  Surgeon: Renette Butters, MD;  Location: [facility];  Service: Orthopedics;  Laterality: Right;   UMBILICAL HERNIA REPAIR  age 54   UPPER GI ENDOSCOPY     Patient Active Problem List   Diagnosis Date Noted   Cerebellar tumor ([place]) 11/15/2021   Comorbid sleep-related hypoventilation 10/19/2021   Urinary incontinence/areflexic bladder 10/05/2021   OSA, unable to tolerate CPAP (vomited every night) 09/05/2021   Chronic pain syndrome 09/05/2021   Chronic nausea 09/05/2021   Neurogenic bladder, followed by Urology, instructed I&O cath BID 09/05/2021   Intolerance of continuous positive airway pressure (CPAP) ventilation 09/03/2021   Insomnia secondary to chronic pain 07/24/2021   Primary osteoarthritis of right knee 07/03/2021    Myofascial pain dysfunction syndrome 07/03/2021   Fibromyalgia 07/03/2021   Hyponatremia 07/03/2021   Primary osteoarthritis of left knee 07/03/2021   Body mass index (BMI) 50.0-59.9, adult ([place]) 03/07/2021   Allergic rhinitis due to animal (cat) (dog) hair and dander 10/06/2020   Allergic rhinitis due to pollen 10/06/2020   Moderate persistent asthma, uncomplicated 66/44/0347   Cerebellar mass 10/06/2020   Paresthesia 05/27/2020   Diabetes mellitus ([place]) 05/25/2020   Hypertension associated with diabetes ([place]) 05/25/2020   Hyperlipidemia associated with type 2 diabetes mellitus ([place]) 05/25/2020   Vitamin D deficiency 05/25/2020   NAFLD (nonalcoholic fatty liver disease) 05/25/2020   Sciatica of left side 05/25/2020    REFERRING DIAG: D49.6 (ICD-10-CM) - Neoplasm of unspecified behavior of brain    THERAPY DIAG:  Other abnormalities of gait and mobility  Unsteadiness on feet  Muscle weakness (generalized)  Other lack of coordination  PERTINENT HISTORY: From H&P note on 11/15/2021:  "On the day of admission 11/07/2021, the patient was taken to the operating room by Dr. Recardo Evangelist of neurosurgery and underwent left suboccipital craniotomy, resection of cerebellar tumor. She tolerated the procedure well. Postoperative MRI performed on 3/22 revealed a small area of diffusion restriction in the left cerebellum."  Patient transferred to [facility] on 11/15/2021, discharged 11/24/2021 per pt.  fibromyalgia, DM2, asthma, HTN, OSA, migraines, bilateral knee OA, myofascial pain syndrome    PRECAUTIONS: Fall  SUBJECTIVE:  Pt states she fell 3 days ago.  She just lost her balance and couldn't regain it.  Wonders if it is related to taking her medicine at 6pm and then there is none in her system when she fell at 5:30am.  PAIN:  Are you having pain? Yes: NPRS scale: 8/10 Pain location: all over    OBJECTIVE:    DIAGNOSTIC FINDINGS: From Brain MRI on 09/02/2021:  "Increased size of left superior  cerebellar vermis mass, now measuring 1.5 x 1.5 cm, previously 1.1 x 1.0 cm. Increased mild surrounding edema."  Future brain MRI ordered w/o date visible at this time.   COGNITION: Overall cognitive status: No family/caregiver present to determine baseline cognitive functioning - pt states "I feel stupid", she elaborates that she is having a hard time processing.     TODAY'S TREATMENT:  Assessed LTGs: -SciFit x8 minutes w/ BUE/BLE L3.0 without a step goal to encourage self-paced continuous activity. -357' no AD, required seated rest -531' w/ rollator on level surface and up and down ramp mod I no LOB, marked improvement in posture from eval and Bil foot clearance -ambulates up and down 8 stairs using alt L and right rail when ascending, supervision level and alternating step pattern -FGA:  [facility] PT Assessment - 04/20/22 0001       Functional Gait  Assessment   Gait assessed  Yes    Gait Level Surface Walks 20 ft in less than 7 sec but greater than 5.5  sec, uses assistive device, slower speed, mild gait deviations, or deviates 6-10 in outside of the 12 in walkway width.    Change in Gait Speed Able to change speed, demonstrates mild gait deviations, deviates 6-10 in outside of the 12 in walkway width, or no gait deviations, unable to achieve a major change in velocity, or uses a change in velocity, or uses an assistive device.    Gait with Horizontal Head Turns Performs head turns smoothly with slight change in gait velocity (eg, minor disruption to smooth gait path), deviates 6-10 in outside 12 in walkway width, or uses an assistive device.    Gait with Vertical Head Turns Performs task with slight change in gait velocity (eg, minor disruption to smooth gait path), deviates 6 - 10 in outside 12 in walkway width or uses assistive device    Gait and Pivot Turn Pivot turns safely in greater than 3 sec and stops with no loss of balance, or pivot turns safely within 3 sec and stops with mild  imbalance, requires small steps to catch balance.    Step Over Obstacle Is able to step over one shoe box (4.5 in total height) without changing gait speed. No evidence of imbalance.    Gait with Narrow Base of Support Ambulates less than 4 steps heel to toe or cannot perform without assistance.    Gait with Eyes Closed Walks 20 ft, no assistive devices, good speed, no evidence of imbalance, normal gait pattern, deviates no more than 6 in outside 12 in walkway width. Ambulates 20 ft in less than 7 sec.    Ambulating Backwards Walks 20 ft, no assistive devices, good speed, no evidence for imbalance, normal gait    Steps Alternating feet, must use rail.    Total Score 20    FGA comment: Moderate fall risk            PATIENT EDUCATION: Education details:  Continue HEP and slowly progressing walking at home.  Continue to monitor BP at home response with changed med dose. Discharge plan for today and progress towards LTGs.  Discussed doing seated boxing with YouTube videos at home and then calling [employer] in [place] to see if they offer beginner level seated boxing as pt really enjoys this activity, edu on need for safety and gradual progression into standing form. Person educated: Patient Education method: Explanation  Education comprehension: verbalized understanding and needs further education     HOME EXERCISE PROGRAM: Access Code: [email or web address] URL: [email or web address] Date: 01/16/2022 Prepared by: Elease Etienne  Exercises - Sit to Stand Without Arm Support  - 1 x daily - 7 x weekly - 3 sets - 10 reps - Standing March with Counter Support  - 1 x daily - 7 x weekly - 3 sets - 10 reps - Chin Tuck  - 1 x daily - 7 x weekly - 3 sets - 10 reps - Seated Cervical Sidebending Stretch  - 1 x daily - 7 x weekly - 3 sets - 10 reps - Supine Bridge  - 1 x daily - 4 x weekly - 3 sets - 10 reps - Supine Active Straight Leg Raise  - 1 x daily - 4 x weekly - 2 sets - 8 reps -  Forward and Backward Monster Walk with Counter Support  - 1 x daily - 4 x weekly - 3 sets - 10 reps - Seated Heel Raise  - 1 x daily - 4 x weekly - 2 sets - 12 reps -  Single Leg Stance on Pillow  - 1 x daily - 4 x weekly - 1 sets - 4 reps - Heel Raises with Counter Support  - 1 x daily - 4 x weekly - 2 sets - 8 reps - Sidelying ITB Stretch off Table  - 1 x daily - 4 x weekly - 1 sets - 2 reps - 45 seconds hold  Initiated walking program 01/30/2022:  Walk in home 2 mins at a time, 5x per day with the RW or rollator.    Seated stepper (pt states she has one that she would like to use) x8-10 minutes in chair with back support to maintain posture, progress time as able by increments of 1-2 minutes a week.  GOALS:  SHORT TERM GOALS:   Target date: 03/23/2022  Walking program will be progressed with pt compliant to promote aerobic gains and activity tolerance. Baseline: Established with pt compliant, needs progression. Goal status: MET  2.  Pt will increase BERG balance score to 50/56 to demonstrate improved static balance. Baseline: 46/56; 03/23/2022 48/56 Goal status: NOT MET  3.  Pt will improve FGA score to >/=15/30 in order to demonstrate improved balance and decreased fall risk. Baseline: 11/30; 03/23/2022 17/30 Goal status: MET  4.  PT to initiate stair training with pt able to negotiate 4 stairs using BUE support and step-to pattern to promote safe independence in home environment. Baseline: 8 stairs w/ Bil rails alt pattern SBA Goal status: MET  LONG TERM GOALS:  Target date: 04/20/2022  Pt will tolerate >/=8 minutes of continuous aerobic exercise to promote improved activity tolerance. Baseline: 5 minutes.  04/20/2022 SciFit x17mins Goal status: MET  2.  Pt will ambulate >/=500' on various level and unlevel surfaces including ramped surface w/ LRAD and supervision to improve safe access to community environment. Baseline: limited to level surfaces w/ RW; 04/20/2022 531' rollator level and  ramp surfaces Goal status: MET  3.  Pt will manage 8 stairs w/ unilateral rail alternating pattern at supervision level to promote safe independence in home and community environments. Baseline: 8 stairs w/ Bil rails alt pattern SBA; 04/20/2022 8 stairs right rail ascending and alt pattern S* Goal status: MET  4.  Pt will improve FGA score to >/=20/30 in order to demonstrate improved balance and decreased fall risk. Baseline: 11/30; 03/23/2022 17/30; 04/20/2022 20/30 Goal status: MET  ASSESSMENT:   CLINICAL IMPRESSION: Assessed LTGs this session with pt meeting 4 of 4 goals.  She demonstrates marked improvement in her upright posture with ambulation of > 500 feet with rollator.  She also tolerates bouts of activity greater than 8 minutes compared to prior 5 minutes or less.  Her FGA score improved from 11 at evaluation to 20/30 at present.  Overall she has made great physical improvement and is appropriate for discharge at this time.  Pt in agreement to plan.  OBJECTIVE IMPAIRMENTS Abnormal gait, decreased activity tolerance, decreased balance, decreased cognition, decreased coordination, decreased endurance, decreased knowledge of condition, decreased knowledge of use of DME, decreased mobility, difficulty walking, decreased ROM, decreased strength, and decreased safety awareness.    ACTIVITY LIMITATIONS cleaning, community activity, driving, meal prep, laundry, and shopping.    PERSONAL FACTORS Age, Behavior pattern, Fitness, and 3+ comorbidities: fibromyalgia, DM2, asthma, HTN, OSA, migraines, bilateral knee OA, myofascial pain syndrome  are also affecting patient's functional outcome.      REHAB POTENTIAL: Fair hx of fibromyalgia, DM2, asthma, HTN, OSA, migraines, bilateral knee OA, myofascial pain syndrome ,  personal factors   CLINICAL DECISION MAKING: Evolving/moderate complexity   EVALUATION COMPLEXITY: Moderate   PLAN: PT FREQUENCY: 1x/week   PT DURATION: 8 weeks (re-cert until  01/23/7702 due to scheduling)   PLANNED INTERVENTIONS: Therapeutic exercises, Therapeutic activity, Neuromuscular re-education, Balance training, Gait training, Patient/Family education, Joint mobilization, Stair training, Vestibular training, DME instructions, Manual therapy, and self care and home management   PLAN FOR NEXT SESSION:  N/A     Bary Richard, PT, DPT 04/20/2022, 10:14 AM

## 2022-04-25 DIAGNOSIS — J3081 Allergic rhinitis due to animal (cat) (dog) hair and dander: Secondary | ICD-10-CM | POA: Diagnosis not present

## 2022-04-25 DIAGNOSIS — J3089 Other allergic rhinitis: Secondary | ICD-10-CM | POA: Diagnosis not present

## 2022-04-25 DIAGNOSIS — J301 Allergic rhinitis due to pollen: Secondary | ICD-10-CM | POA: Diagnosis not present

## 2022-05-02 DIAGNOSIS — J3081 Allergic rhinitis due to animal (cat) (dog) hair and dander: Secondary | ICD-10-CM | POA: Diagnosis not present

## 2022-05-02 DIAGNOSIS — J3089 Other allergic rhinitis: Secondary | ICD-10-CM | POA: Diagnosis not present

## 2022-05-02 DIAGNOSIS — J301 Allergic rhinitis due to pollen: Secondary | ICD-10-CM | POA: Diagnosis not present

## 2022-05-03 ENCOUNTER — Ambulatory Visit: Payer: Medicare HMO | Admitting: Neurology

## 2022-05-03 ENCOUNTER — Encounter: Payer: Self-pay | Admitting: Neurology

## 2022-05-03 VITALS — BP 155/78 | HR 99 | Ht 62.0 in | Wt 276.6 lb

## 2022-05-03 DIAGNOSIS — Z6841 Body Mass Index (BMI) 40.0 and over, adult: Secondary | ICD-10-CM | POA: Diagnosis not present

## 2022-05-03 DIAGNOSIS — D432 Neoplasm of uncertain behavior of brain, unspecified: Secondary | ICD-10-CM | POA: Diagnosis not present

## 2022-05-03 DIAGNOSIS — Z9989 Dependence on other enabling machines and devices: Secondary | ICD-10-CM | POA: Diagnosis not present

## 2022-05-03 DIAGNOSIS — D496 Neoplasm of unspecified behavior of brain: Secondary | ICD-10-CM | POA: Diagnosis not present

## 2022-05-03 DIAGNOSIS — J4540 Moderate persistent asthma, uncomplicated: Secondary | ICD-10-CM

## 2022-05-03 DIAGNOSIS — G4733 Obstructive sleep apnea (adult) (pediatric): Secondary | ICD-10-CM | POA: Diagnosis not present

## 2022-05-03 MED ORDER — LIDOCAINE 5 % EX OINT: 1.0000 | TOPICAL_OINTMENT | CUTANEOUS | 0 refills | Status: DC | PRN

## 2022-05-03 NOTE — Addendum Note (Signed)
Addended by: Larey Seat on: 05/03/2022 08:59 AM   Modules accepted: Orders

## 2022-05-03 NOTE — Patient Instructions (Signed)
KEEPING IT CLEAN: CPAP HYGIENE PROPER UPKEEP OF YOUR CPAP MACHINE CAN HELP ENSURE THE DEVICE FUNCTIONS PROPERLY CPAP CLEANING INSTRUCTIONS Along with proper CPAP cleaning it is recommended that you replace your mask, tubing and filters once very 3 months and more frequently if you are sick.   DAILY CLEANING Do not use moisturizing soaps, bleach, scented oils, chlorine, or alcohol-based solutions to clean your supplies. These solutions may cause irritation to your skin and lungs and may reduce the life of your products. Dawn Dish soap or Comparable works best for daily cleaning.  **If you've been sick, it's smart to wash your mask, tubing, humidifier and filter daily until your cold, flu or virus symptoms are gone. That can help reduce the amount of time you spend under the weather.  Before using your mask -wash your face daily with soap and water to remove excess facial oils. Wipe down your mask (including areas that come in contact with your skin) using a damp towel with soap and warm water. This will remove any oils, dead skin cells, and sweat on the mask that can affect the quality of the seal. Gently rinse with a clean towel and let the mask air-dry out of direct sunlight. You can also use unscented baby wipes or pre-moistened towels designed specifically for cleaning CPAP masks, which are available on-line. DO NOT USE CLOROX OR DISINFECTING WIPES. If your unit has a humidifier, empty any leftover water instead of letting in sit in the unit all day. Refill the humidifier with clean, distilled water right before bedtime for optimal use  WEEKLY (OR MORE FREQUENT) CLEANING Your mask and tubing need a full bath at least once a week to keep it free of dust, bacteria, and germs. (During COVID-19 or any other flu/virus we recommend more frequent cleaning) Clean the CPAP tubing, nasal mask, and headgear in a bathroom sink filled with warm water and a few drops of ammonia-free, mild dish detergent. Avoid  using stronger cleaning products, as they may damage the mask or leave harmful residue. Swirl all parts around for about five minutes, rinse well and let air dry during the day. Hang the tubing over the shower rod, on a towel rack or in the laundry room to ensure all the water drips out. The mask and headgear can be air-dried on a towel or hung on a hook or hanger. You should also wipe down your CPAP machine with a damp cloth. Ensure the unit is unplugged. The towel shouldn't be too damp or wet, as water could get into the machine. Clean the filter by removing it and rinsing it in warm tap water. Run it under the water and squeeze to make sure there is no dust. Then blot down the filter with a towel. Do not wash your machine's white filter, if one is present--those are disposable and should be replaced every two weeks. If you are recovering from being sick, we recommend changing the filter sooner. If your CPAP has a humidifier, that also needs to be cleaned weekly. Empty any remaining water and then wash the water chamber in the sink with warm soapy water. Rinse well and drain out as much of the water as possible. Let the chamber air-dry before placing it back into the CPAP unit. Every other week you should disinfect the humidifier. Do that by soaking it in a solution of one-part vinegar to five parts water for 30 minutes, thoroughly rinsing and then placing in your dishwasher's top rack for washing. And   keep it clean by using only distilled water to prevent mineral deposits that can build up and cause damage to your machine.  IMPORTANT TIPS Make caring for your CPAP equipment part of your morning routine. Keep machine and accessories out of direct sunlight to avoid damaging them. Never use bleach to clean accessories. Place machine on a level surface and away from curtains that may interfere with the air intake. Keep track of when you should order replacement parts for your mask and accessories so that  you always get the most out of your CPAP. You can also sign up for [employer] by contacting our DME department at [email or web address]  The following are examples of soap that may be used: [employer] baby soap, Ivory soap (plain).  With a little upkeep, your CPAP can continue to help you breathe better for a long time. Just a few minutes a day can help keep your CPAP running efficiently for years to come.  If you have a CPAP, but are struggling with compliance, check out our no mask oral appliance, for those with mild to moderate sleep apnea.  Call and schedule a consultation with one of our sleep medicine physicians, or ask your doctor about a sleep referral to the [facility].  

## 2022-05-03 NOTE — Progress Notes (Signed)
SLEEP MEDICINE CLINIC    Provider:  Larey Seat, MD  Primary Care Physician:  Shirline Frees, MD [address] [place] [address]     Referring Provider: Shirline Frees, [place],  [place] [address]          Chief Complaint according to patient   Patient presents with:     New Patient (Initial Visit)      Referred by weight and wellness, addressed as Miss Virginia Luna      HISTORY OF PRESENT ILLNESS:  Virginia Luna is a 61 y.o. AA female patient and was seen here as a new Consult patient , upon referral on 05/03/2022 from Dr Juleen China.  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 the patient had undergone a split-night polysomnography 08-18-2021, followed by a full night CPAP titration in February 2023.  She was titrated to a final pressure of 13 cm water. She started with CPAP compliantly but the surgery left her scalp very tender and there have been coordination problems and how to attach the mask.    This has improved- she just finished occupational therapy and over the last 14 days her compliance has significantly increased.   Her auto CPAP is set between 5 and 13 cm water with 3 cm EPR her residual AHI is 0.5/h average user time is 2 hours 56 minutes but it is not clearly increasing trend and she has a good medical reason not to have been able to use it.  The 95th percentile pressure is 7.8 cm water and air leakage at the 95th percentile is 56 L a minute.  I think we need to find a mask that fits better or is easier to apply.  But she that if she does show a improving trend.   her incoming blood pressure was elevated at 155/78 mmHg, she is 5 foot 2 and lengths and 276 pounds at this point in clothing.  I looked through her medication list excellent no major changes at this time she still has hydrocodone according to this  list at bedtime and she uses it at bedtime.  In spite of this, I do not see central apneas arising.       Had another post OP MRI here at [facility] ;  FINDINGS:  Expected post-operative findings status post left cerebellar tumor  resection. There is no critical mass effect, midline shift, or interval  herniation.   Wedge-shaped foci of diffusion restriction with edema in the left  cerebellar hemisphere, likely related to devitalization. There is no  associated internal hemorrhage appreciated. (Image 32, Series 2).   The ventricles are normal in size without hydrocephalus. Diffusion  weighted images demonstrate no recent infarction. The marrow signal  pattern is normal.   IMPRESSION:  1. Post-operative findings status post left cerebellar tumor resection.  2. Area of diffusion restriction in the left cerebellum, for follow-up.   THIS IS AN ELECTRONICALLY VERIFIED FINAL REPORT  11/09/2021 12:16 AM - Electronically signed by Tressie Ellis  Workstation: [id number]  [facility] Procedure Note  Virginia Munch, MD - 11/09/2021             Chief concern according to patient :  Internal referral for insomnia due to mental conditions.  The patient is followed for Neurologic-non sleep issues by [facility], her primary neurologist.  The patietnt weighted 289 pounds at her highest level and now is at 266 pounds. Her BMI remains at 50 .   I have the pleasure of seeing Virginia Luna 07-24-2020, a right-handed Black or [place] American female with chronic insomnia- related to  pain. Onset in her twenties,    She  has a past medical history of Allergic Rhinitis , Anemia, OSTEO_Arthritis, childhood and allergic Asthma, Chronic pain, Complication of anesthesia, Diabetes ([place]), Diabetes mellitus without complication ([place]), Edema, lower extremity, Fibroid, Fibromyalgia, GERD (gastroesophageal reflux disease), Headache, High blood pressure, History of hiatal hernia, History of stomach  ulcers, IBS (irritable bowel syndrome), Joint pain, Sleep apnea, and Wears contact lenses. Fall injuries with fracture of right wrist    The patient had the first sleep study in the year 2020  with a diagnosis of OSA, [facility].  she was placed on CPAP, but d/c after developing nausea. She reported aerophagia.  Nocturia/ Enuresis 2-3 times, Sleep walking and talking, no Tonsillectomy, cervical spine DDD, no surgery.    Family medical /sleep history:  Brother on CPAP with OSA prior to his bariatric surgery- , insomnia sleep walking in siblings. .    Social history: Patient is disabled from [employer],  and lives in a household alone. Has a constant companion.  Family status is single- never married- Tobacco use; none.  ETOH use none,  Caffeine intake in form of Coffee( /) Soda( /) Tea ( 2 week) or energy drinks. Regular exercise in form of walking with friends.       Sleep habits are as follows: The patient's dinner time is between 6-6.30PM. The patient goes to bed at 9-10  PM and she struggles to sleep without medication ( this is a chronic condition and treated by Dr Kenton Kingfisher, attributed to FIBROMYALGIA ) continues to sleep for 4-5  hours, wakes for several bathroom breaks   The preferred sleep position is supine, with the support of 2-3 pillows. Severe GERD causes arousals and sometimes she will vomit at night- constant nausea and feeling of fullness.  She wakes sometimes feeling paralyzed, scared by these spell.  Dreams are reportedly rare. Used to have nightmares.  5.30  AM is the usual rise time. The patient wakes up spontaneously 5 AM-before an alarm. She reports not feeling refreshed or restored in AM, with symptoms such as dry mouth, morning headaches, and residual fatigue.  Naps are taken infrequently after lunch, lasting from 10 to 30 minutes and are more refreshing than nocturnal sleep.      TEST RESULTS:  11/02/2021 1:46 PM EDT   18 x 12 mm enhancing superior  cerebellar lesion. Differential considerations include a metastatic lesion in addition to a primary cerebellar neoplasm.   THIS IS AN ELECTRONICALLY VERIFIED FINAL REPORT 11/02/2021 1:46 PM - Electronically signed by Delmer Islam  Workstation: [email or web address] Results - CT Head Stereotactic W Contrast (11/02/2021 11:43 AM EDT) Narrative  11/02/2021 1:46 PM EDT   DATE OF SERVICE: 11/02/2021 11:43 am  EXAM: CT HEAD WITH IV CONTRAST  CLINICAL HISTORY: Benign neoplasm of the brain  COMPARISON: Outside MRI 09/02/2021  TECHNIQUE: Axial CT of the head performed with IV contrast using the stereotactic protocol  Dose lowering technique(s) such as automated exposure control, iterative reconstruction, and mA and/or KV adjustment for patient size was utilized for this exam.  CONTRAST: 80 cc Omnipaque 350  FINDINGS: The ventricles are unchanged in size. There is an oval  shaped peripherally enhancing mass in the superior cerebellum on the left which measures approximately 18 x 12 mm in size, which is similar to the previous study. Minimal adjacent hypoattenuation or edema evident. No significant mass effect. There are no additional enhancing lesions in the brain.  The calvarium and skull base appear unremarkable.      Imaging Results - CT Head Stereotactic W Contrast (11/02/2021 11:43 AM EDT) Procedure Note  Margaretha Seeds, MD - 11/02/2021   Formatting of this note might be different from the original. [place]: 11/02/2021 11:43 am  Today's occupational therapy treatment focused on increasing activity tolerance and BADL (I). The patient has demonstrated Steady progress as evidenced by decreased assistance for bathing and LBD this date. At this time, the patient still presents with Activity Tolerance Deficits, Balance Deficits, Basic Activity of Daily living deficits, Feeding Deficits, IADL deficits, Mobility deficits, Pain limiting function which is limiting safe  performance of ADL's, Instrumental ADL's , and Ability to return to prior level of function . Further skilled occupational therapy services are reasonable and necessary to address the above impairments and performance deficits. BA self-care AM-PAC 19/24 - 36.99% DISABILITY OT Recommendation: OT Recommendation: Can tolerate 3 hrs of therapy, Rehabilitation Facility OT Equipment Recommended: OT Equipment Recommended: (TBD) PROBLEM LIST: Patient Active Problem List  Diagnosis  Diabetes mellitus (CMS/HCC)  Asthma  Sleep apnea  Migraines  Cerebellar tumor (CMS/HCC)  Fibromyalgia  HTN (hypertension)  HLD (hyperlipidemia)  Neurogenic bladder  Vitamin D deficiency  Cerebellar mass  Postoperative pain    Review of Systems: Out of a complete 14 system review, the patient complains of only the following symptoms, and all other reviewed systems are negative.:  Fatigue, sleepiness , snoring, fragmented sleep, Insomnia  Pt has fibromyalgia and has been having lots of pn at night and has been keeping her up.   Anxiety- sleeps better with someone there- safety.    How likely are you to doze in the following situations: 0 = not likely, 1 = slight chance, 2 = moderate chance, 3 = high chance   Sitting and Reading? Watching Television? Sitting inactive in a public place (theater or meeting)? As a passenger in a car for an hour without a break? Lying down in the afternoon when circumstances permit? Sitting and talking to someone? Sitting quietly after lunch without alcohol? In a car, while stopped for a few minutes in traffic?   Total = pre CPP and Pro OP- 9 / 24 points   FSS endorsed at 32/ 63 points.   The Epworth sleepiness score was today ( 05-03-2022) endorsed at 2 out of 24 points in the fatigue severity score at 10 out of 63 points there is very good  Social History   Socioeconomic History   Marital status: Single    Spouse name: Not on file   Number of children: Not on file    Years of education: Not on file   Highest education level: Some college, no degree  Occupational History   Occupation: disabled    Comment: Psychologist, occupational at daycare  Tobacco Use   Smoking status: Never   Smokeless tobacco: Never  Vaping Use   Vaping Use: Never used  Substance and Sexual Activity   Alcohol use: No   Drug use: No   Sexual activity: Not Currently  Other Topics Concern   Not on file  Social History Narrative   Lives w roommate   Right handed   Caffeine: 2 cups of tea  a week. 2 sodas a week   Social Determinants of Radio broadcast assistant Strain: Not on file  Food Insecurity: Not on file  Transportation Needs: Not on file  Physical Activity: Not on file  Stress: Not on file  Social Connections: Not on file    Family History  Problem Relation Age of Onset   Obesity Mother    Diabetes Father    High blood pressure Father    Sudden death Father    Breast cancer Paternal Grandmother    Breast cancer Paternal Aunt     Past Medical History:  Diagnosis Date   Allergies    Anemia    Arthritis    Asthma    Back pain    Chronic pain    Complication of anesthesia    woke up during colonoscopy   Diabetes ([place])    Diabetes mellitus without complication (HCC)    Edema, lower extremity    Fibroid    Fibromyalgia    Chronic   GERD (gastroesophageal reflux disease)    Headache    migraines   High blood pressure    History of hiatal hernia    History of stomach ulcers    IBS (irritable bowel syndrome)    Joint pain    Sleep apnea    did use cpap-lost 25lb-says she does not need it   NO CPAP   Wears contact lenses     Past Surgical History:  Procedure Laterality Date   BRAIN SURGERY Left 11/07/2021   [facility] Dr. Herschel [place]   BREAST BIOPSY     BREAST EXCISIONAL BIOPSY     BREAST LUMPECTOMY WITH RADIOACTIVE SEED LOCALIZATION Bilateral 11/24/2014   Procedure: BILATERAL BREAST LUMPECTOMY WITH RADIOACTIVE SEED LOCALIZATION;  Surgeon: [name],  MD;  Location: [place];  Service: General;  Laterality: Bilateral;   CHOLECYSTECTOMY     COLONOSCOPY     DILATION AND CURETTAGE OF UTERUS     ESOPHAGOGASTRODUODENOSCOPY (EGD) WITH PROPOFOL N/A 07/23/2016   Procedure: ESOPHAGOGASTRODUODENOSCOPY (EGD) WITH PROPOFOL;  Surgeon: Laurence Spates, MD;  Location: [facility];  Service: Endoscopy;  Laterality: N/A;   ESOPHAGOGASTRODUODENOSCOPY (EGD) WITH PROPOFOL N/A 06/11/2018   Procedure: ESOPHAGOGASTRODUODENOSCOPY (EGD) WITH PROPOFOL;  Surgeon: Laurence Spates, MD;  Location: [facility];  Service: Endoscopy;  Laterality: N/A;   LUMBAR LAMINECTOMY  2010   ORIF WRIST FRACTURE Right 11/24/2020   Procedure: OPEN REDUCTION INTERNAL FIXATION RIGHT WRIST FRACTURE;  Surgeon: Renette Butters, MD;  Location: [facility];  Service: Orthopedics;  Laterality: Right;   UMBILICAL HERNIA REPAIR     age 44   UPPER GI ENDOSCOPY       Current Outpatient Medications on File Prior to Visit  Medication Sig Dispense Refill   acetaminophen (TYLENOL) 325 MG tablet Take 1-2 tablets (325-650 mg total) by mouth every 4 (four) hours as needed for mild pain.     amLODipine (NORVASC) 5 MG tablet Take 1 tablet (5 mg total) by mouth daily. (Patient taking differently: Take 10 mg by mouth daily.) 30 tablet 0   atorvastatin (LIPITOR) 10 MG tablet Take 1 tablet (10 mg total) by mouth at bedtime. 30 tablet 0   azelastine (ASTELIN) 0.1 % nasal spray Place 1 spray into both nostrils 2 (two) times daily as needed for rhinitis. 30 mL 0   BREO ELLIPTA 200-25 MCG/ACT AEPB Inhale 1 puff into the lungs daily.     cyclobenzaprine (FLEXERIL) 10 MG tablet Take 20 mg by mouth at  bedtime.     diclofenac (VOLTAREN) 75 MG EC tablet Take 75 mg by mouth 2 (two) times daily.     EPINEPHrine 0.3 mg/0.3 mL IJ SOAJ injection Inject 0.3 mg into the muscle as needed for anaphylaxis.     fluticasone (FLONASE) 50 MCG/ACT nasal spray Place 1 spray into both nostrils 2 (two) times daily as  needed for allergies.     furosemide (LASIX) 20 MG tablet Take 20 mg by mouth daily as needed for fluid.     gabapentin (NEURONTIN) 300 MG capsule Take 300 mg by mouth 2 (two) times daily.     HYDROcodone-acetaminophen (NORCO/VICODIN) 5-325 MG tablet Take 2 tablets by mouth at bedtime.     levocetirizine (XYZAL) 5 MG tablet Take 1 tablet (5 mg total) by mouth every evening. 30 tablet 0   linaclotide (LINZESS) 72 MCG capsule Take 1 capsule (72 mcg total) by mouth daily as needed (constipation). 90 capsule 0   meclizine (ANTIVERT) 12.5 MG tablet Take 12.5 mg by mouth 3 (three) times daily as needed.     meclizine (ANTIVERT) 25 MG tablet Take 1 tablet (25 mg total) by mouth 3 (three) times daily as needed for dizziness. 60 tablet 0   melatonin 5 MG TABS Take 1 tablet (5 mg total) by mouth at bedtime. 30 tablet 0   methocarbamol (ROBAXIN) 750 MG tablet Take 1 tablet (750 mg total) by mouth every 8 (eight) hours as needed for muscle spasms. (Patient taking differently: Take 750 mg by mouth 3 (three) times daily.) 60 tablet 0   montelukast (SINGULAIR) 10 MG tablet Take 1 tablet (10 mg total) by mouth at bedtime. 30 tablet 0   NON FORMULARY CPAP at bedtime     ondansetron (ZOFRAN ODT) 4 MG disintegrating tablet Take 1 tablet (4 mg total) by mouth every 6 (six) hours as needed for nausea or vomiting. 20 tablet 0   ondansetron (ZOFRAN-ODT) 4 MG disintegrating tablet TAKE ONE TABLET BY MOUTH EVERY 8 HOURS AS NEEDED FOR NAUSEA AND VOMITING 90 tablet 5   Oxcarbazepine (TRILEPTAL) 300 MG tablet Take 3 tablets (900 mg total) by mouth at bedtime. For nerve pain 90 tablet 5   pantoprazole (PROTONIX) 40 MG tablet Take 1 tablet (40 mg total) by mouth daily. 30 tablet 0   polyethylene glycol (MIRALAX / GLYCOLAX) 17 g packet Take 17 g by mouth daily as needed for mild constipation. 14 each 0   QUEtiapine (SEROQUEL) 300 MG tablet Take 1 tablet (300 mg total) by mouth at bedtime. 30 tablet 0   Semaglutide,0.25 or  0.'5MG'$ /DOS, (OZEMPIC, 0.25 OR 0.5 MG/DOSE,) 2 MG/1.5ML SOPN Inject 0.5 mg into the skin once a week. (Patient taking differently: Inject 0.5 mg into the skin every Sunday.) 1.5 mL 0   senna-docusate (SENOKOT-S) 8.6-50 MG tablet Take 2 tablets by mouth at bedtime. 60 tablet 0   Tetrahydrozoline HCl (VISINE OP) Place 1 drop into both eyes 2 (two) times daily as needed (dry eyes).     Vitamin D, Ergocalciferol, (DRISDOL) 1.25 MG (50000 UNIT) CAPS capsule TAKE ONE CAPSULE BY MOUTH EVERY 7 DAYS (Patient taking differently: Take 50,000 Units by mouth every Sunday.) 4 capsule 0   zolpidem (AMBIEN) 10 MG tablet Take 1 tablet (10 mg total) by mouth at bedtime. 30 tablet 5   zonisamide (ZONEGRAN) 100 MG capsule Take 2 capsules (200 mg total) by mouth daily. 30 capsule 5   albuterol (PROVENTIL) (2.5 MG/3ML) 0.083% nebulizer solution Take 3 mLs (2.5 mg total)  by nebulization every 6 (six) hours as needed for up to 14 days for wheezing or shortness of breath. 75 mL 0   No current facility-administered medications on file prior to visit.    Allergies  Allergen Reactions   Rocephin [Ceftriaxone] Anaphylaxis   Morphine And Related Itching and Nausea And Vomiting    Doesn't work   Prednisone Itching and Swelling   Sulfa Antibiotics Itching and Swelling   Amitriptyline Other (See Comments)   Penicillin G Sodium Other (See Comments)    Physical exam:  Today's Vitals   05/03/22 0736  BP: (!) 155/78  Pulse: 99  Weight: 276 lb 9.6 oz (125.5 kg)  Height: '5\' 2"'$  (1.575 m)   Body mass index is 50.59 kg/m.   Wt Readings from Last 3 Encounters:  05/03/22 276 lb 9.6 oz (125.5 kg)  03/22/22 272 lb (123.4 kg)  02/07/22 275 lb 9.2 oz (125 kg)     Ht Readings from Last 3 Encounters:  05/03/22 '5\' 2"'$  (1.575 m)  03/22/22 '5\' 5"'$  (1.651 m)  02/07/22 '5\' 2"'$  (1.575 m)      General: The patient is awake, alert and appears not in acute distress. The patient is well groomed. Head: Normocephalic, atraumatic.  Neck  is supple. Mallampati 3,  neck circumference:15 inches . Nasal airflow patent.  Retrognathia is not seen.  Dental status:  Cardiovascular:  Regular rate and cardiac rhythm by pulse,  without distended neck veins. Respiratory: Lungs are clear to auscultation.  Skin:  Without evidence of ankle edema, or rash. Trunk: The patient's posture is erect.   Neurologic exam : The patient is awake and alert, oriented to place and time.   Memory subjective described as intact.  Attention span & concentration ability appears normal.  Speech is fluent,  without  dysarthria, dysphonia or aphasia.  Mood and affect are appropriate.   Cranial nerves: no loss of smell or taste reported  Pupils are equal and briskly reactive to light. Funduscopic exam .  Extraocular movements in vertical and horizontal planes were intact and without nystagmus. No Diplopia. Visual fields by finger perimetry are intact. Hearing was intact to soft voice and finger rubbing.    Facial sensation intact to fine touch.  Facial motor strength is symmetric and tongue and uvula move midline.  Neck ROM : rotation, tilt and flexion extension were normal for age and shoulder shrug was symmetrical.    Motor exam:  Symmetric bulk, tone and ROM.   Normal tone without cog wheeling, symmetric grip strength .   Sensory:  Fine touch, pinprick and vibration were tested  and  normal.  Proprioception tested in the upper extremities was normal.   Coordination: Rapid alternating movements in the fingers/hands were of normal speed.  The Finger-to-nose maneuver was intact without evidence of ataxia, dysmetria or tremor.   Gait and station: Patient could rise unassisted from a seated position, walked without assistive device.  Toe and heel walk were deferred.  Deep tendon reflexes: in the  upper and lower extremities are symmetric and intact.  Babinski response was deferred.    After spending a total time of  50  minutes : including review of  paper referral, of face to face and additional time for physical and neurologic examination, review of laboratory studies,  personal review of imaging studies, reports and results of other testing and review of referral information / records as far as provided in visit, I have established the following assessments:  1)This patient is at  high risk of APNEA but her INSOMNIA is unlikely related- she has sleep initiation insomnia, which is a psychological or psychiatric condition. Pain usually interrupts sleep. The patient did not report RLS.    1) OSA: risk factors : Her BMI is morbidly elevated and alone increases her risk of OSA. Plus  medication related risks.  She was found to have OSA and started on CPAP-  since then her morning headaches have been controlled and much ess frequent, EDS reduced, Fatigue much reduce.  Her Morning headaches were related to untreated OSA.  I cannot and will not maintain this patient on sleep aids to which she has become accustomed. That problem would need to be deferred to a psychologist, cognitive behavior specialist or Psychiatrist.  I am concerned that no psychiatric care provider is listed and no ICD codes of any psychiatric disease have been entered. She sees Dr Clovis Pu. She continues on Seroquel and opiate pain medication at night.      My Plan is to proceed with:  1)Continue CPAP use- the result is excellent in numeric reduction of apnea and in terms of daytime sleepiness reduction.   2) DME to refit for a mask that is easier to wear for a dexterity challenged person.   I would like to thank Dr. Hermelinda Medicus, Dr Tomi Likens, Dr Kenton Kingfisher   for allowing me to meet with and to take care of this pleasant patient.   In short, Austine A Wentzel is presenting with chronic insomnia, throughout  adult life, and chronic pain- cerebellar tumor ( craniotomy) and treated OSA.-  I plan to follow up either personally or through our NP within 6 months.     Electronically  signed by: Larey Seat, MD 05/03/2022 8:32 AM  [facility] Board certified by The [employer] of Sleep Medicine and Diplomate of the [employer] of Sleep Medicine. Board certified In Neurology through the [place], Fellow of the [employer] of Neurology. Medical Director of [facility].

## 2022-05-09 DIAGNOSIS — E7800 Pure hypercholesterolemia, unspecified: Secondary | ICD-10-CM | POA: Diagnosis not present

## 2022-05-09 DIAGNOSIS — J3081 Allergic rhinitis due to animal (cat) (dog) hair and dander: Secondary | ICD-10-CM | POA: Diagnosis not present

## 2022-05-09 DIAGNOSIS — J3089 Other allergic rhinitis: Secondary | ICD-10-CM | POA: Diagnosis not present

## 2022-05-09 DIAGNOSIS — I10 Essential (primary) hypertension: Secondary | ICD-10-CM | POA: Diagnosis not present

## 2022-05-09 DIAGNOSIS — K219 Gastro-esophageal reflux disease without esophagitis: Secondary | ICD-10-CM | POA: Diagnosis not present

## 2022-05-09 DIAGNOSIS — E119 Type 2 diabetes mellitus without complications: Secondary | ICD-10-CM | POA: Diagnosis not present

## 2022-05-09 DIAGNOSIS — J301 Allergic rhinitis due to pollen: Secondary | ICD-10-CM | POA: Diagnosis not present

## 2022-05-14 DIAGNOSIS — B9689 Other specified bacterial agents as the cause of diseases classified elsewhere: Secondary | ICD-10-CM | POA: Diagnosis not present

## 2022-05-14 DIAGNOSIS — Z6841 Body Mass Index (BMI) 40.0 and over, adult: Secondary | ICD-10-CM | POA: Diagnosis not present

## 2022-05-14 DIAGNOSIS — J329 Chronic sinusitis, unspecified: Secondary | ICD-10-CM | POA: Diagnosis not present

## 2022-05-14 DIAGNOSIS — E559 Vitamin D deficiency, unspecified: Secondary | ICD-10-CM | POA: Diagnosis not present

## 2022-05-14 DIAGNOSIS — E7800 Pure hypercholesterolemia, unspecified: Secondary | ICD-10-CM | POA: Diagnosis not present

## 2022-05-14 DIAGNOSIS — E119 Type 2 diabetes mellitus without complications: Secondary | ICD-10-CM | POA: Diagnosis not present

## 2022-05-17 DIAGNOSIS — J3081 Allergic rhinitis due to animal (cat) (dog) hair and dander: Secondary | ICD-10-CM | POA: Diagnosis not present

## 2022-05-17 DIAGNOSIS — J301 Allergic rhinitis due to pollen: Secondary | ICD-10-CM | POA: Diagnosis not present

## 2022-05-17 DIAGNOSIS — J3089 Other allergic rhinitis: Secondary | ICD-10-CM | POA: Diagnosis not present

## 2022-05-18 ENCOUNTER — Other Ambulatory Visit: Payer: Self-pay

## 2022-05-18 ENCOUNTER — Telehealth: Payer: Self-pay | Admitting: Neurology

## 2022-05-18 MED ORDER — ZONISAMIDE 100 MG PO CAPS
200.0000 mg | ORAL_CAPSULE | Freq: Every day | ORAL | 5 refills | Status: DC
Start: 2022-05-18 — End: 2022-06-11

## 2022-05-18 NOTE — Telephone Encounter (Signed)
1. Which medications need refilled? (List name and dosage, if known) zonisamide 100 mg  2. Which pharmacy/location is medication to be sent to? (include street and city if local pharmacy) [facility]  3. Do they need a 30 day or 90 day supply? 90  Patient is out of the medicine.

## 2022-05-18 NOTE — Telephone Encounter (Signed)
Refill was sent in today at 2:11pm

## 2022-05-23 ENCOUNTER — Encounter: Payer: Self-pay | Admitting: Physical Medicine and Rehabilitation

## 2022-05-23 ENCOUNTER — Encounter: Payer: Medicare HMO | Attending: Physical Medicine and Rehabilitation | Admitting: Physical Medicine and Rehabilitation

## 2022-05-23 VITALS — BP 138/88 | HR 86 | Ht 62.0 in | Wt 273.0 lb

## 2022-05-23 DIAGNOSIS — R110 Nausea: Secondary | ICD-10-CM | POA: Insufficient documentation

## 2022-05-23 DIAGNOSIS — M797 Fibromyalgia: Secondary | ICD-10-CM | POA: Insufficient documentation

## 2022-05-23 DIAGNOSIS — J301 Allergic rhinitis due to pollen: Secondary | ICD-10-CM | POA: Diagnosis not present

## 2022-05-23 DIAGNOSIS — J3089 Other allergic rhinitis: Secondary | ICD-10-CM | POA: Diagnosis not present

## 2022-05-23 DIAGNOSIS — G894 Chronic pain syndrome: Secondary | ICD-10-CM | POA: Insufficient documentation

## 2022-05-23 DIAGNOSIS — M7918 Myalgia, other site: Secondary | ICD-10-CM | POA: Diagnosis not present

## 2022-05-23 DIAGNOSIS — J3081 Allergic rhinitis due to animal (cat) (dog) hair and dander: Secondary | ICD-10-CM | POA: Diagnosis not present

## 2022-05-23 MED ORDER — LIDOCAINE HCL 1 % IJ SOLN
6.0000 mL | Freq: Once | INTRAMUSCULAR | Status: AC
  Administered 2022-05-23: 6 mL

## 2022-05-23 MED ORDER — OXCARBAZEPINE 300 MG PO TABS
900.0000 mg | ORAL_TABLET | Freq: Every day | ORAL | 1 refills | Status: DC
Start: 2022-05-23 — End: 2022-11-02

## 2022-05-23 NOTE — Patient Instructions (Signed)
Plan: Migraines via Dr Tomi Likens- Neurology.   2. Patient here for trigger point injections for  Consent done and on chart.  Cleaned areas with alcohol and injected using a 27 gauge 1.5 inch needle  Injected 6cc- nothing wasted Using 1% Lidocaine with no EPI  Upper traps B/L  Levators- B/L  Posterior scalenes Middle scalenes- B/L  Splenius Capitus- B/L  Pectoralis Major- B/L  Rhomboids- B/L x2 Infraspinatus Teres Major/minor Thoracic paraspinals Lumbar paraspinals B/L  Other injections-1st dorsal interoassei    Patient's level of pain prior was 9.5/10 Current level of pain after injections is down to 7/10 right after injections  There was no bleeding or complications.  Patient was advised to drink a lot of water on day after injections to flush system Will have increased soreness for 12-48 hours after injections.  Can use Lidocaine patches the day AFTER injections Can use theracane on day of injections in places didn't inject Can use heating pad 4-6 hours AFTER injections  3. Will refill Oxcarbemazepine 900 mg nightly- # 270- with 1 refill- so 6 months supply.   4. F/U in 3 months- for trigger point injections.

## 2022-05-23 NOTE — Addendum Note (Signed)
Addended by: Jasmine December T on: 05/23/2022 10:03 AM   Modules accepted: Orders

## 2022-05-23 NOTE — Progress Notes (Signed)
Pt is a 61 yr old female with hx of fibromyalgia- dx'd at age 24, DM2,- Ac1 6.2;  asthma; HTN, OSA, BMI is 50; Nodule in L superior cerebellum. Also has migraines- intractable and daily; has trace aura;  No kidney issues- Cr 0.65 and BUN 15 in 6/22. Also end stage OA of B/L knees medially and R hip pain. Also has myofascial pain syndrome.  Has new brain mass that was documented Worsening cerebellar Sx's. 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    Trying to get through things- has been rough.  Pain and shaking are  Still falling some-  Still passing out. Also d/w Dr Tomi Likens and PCP.  Not occurring as often.    Was in ER once since saw me- and BP was rising- went up to 993/Z systolic- was shaking really bad.  They got a brain MRI-  Increased Zonegran to 200 mg form 100 mg daily.   Still taking Trileptal 900 mg QHS-  Also taking Zofran as needed-  Usually takes ~ 1x/week.     Plan: Migraines via Dr Tomi Likens- Neurology.- d/w pt-    2. Patient here for trigger point injections for  Consent done and on chart.  Cleaned areas with alcohol and injected using a 27 gauge 1.5 inch needle  Injected 6cc- nothing wasted Using 1% Lidocaine with no EPI  Upper traps B/L  Levators- B/L  Posterior scalenes Middle scalenes- B/L  Splenius Capitus- B/L  Pectoralis Major- B/L  Rhomboids- B/L x2 Infraspinatus Teres Major/minor Thoracic paraspinals Lumbar paraspinals B/L  Other injections-1st dorsal interoassei    Patient's level of pain prior was 9.5/10 Current level of pain after injections is down to 7/10 right after injections  There was no bleeding or complications.  Patient was advised to drink a lot of water on day after injections to flush system Will have increased soreness for 12-48 hours after injections.  Can use Lidocaine patches the day AFTER injections Can use theracane on day of  injections in places didn't inject Can use heating pad 4-6 hours AFTER injections  3. Will refill Oxcarbemazepine 900 mg nightly- # 270- with 1 refill- so 6 months supply.   4. F/U in 3 months- for trigger point injections.   I spent a total of  20   minutes on total care today- >50% coordination of care- due to 10 minutes on trigger point injetcions.

## 2022-05-24 DIAGNOSIS — J301 Allergic rhinitis due to pollen: Secondary | ICD-10-CM | POA: Diagnosis not present

## 2022-05-24 DIAGNOSIS — J3081 Allergic rhinitis due to animal (cat) (dog) hair and dander: Secondary | ICD-10-CM | POA: Diagnosis not present

## 2022-05-24 DIAGNOSIS — J3089 Other allergic rhinitis: Secondary | ICD-10-CM | POA: Diagnosis not present

## 2022-05-28 DIAGNOSIS — N3946 Mixed incontinence: Secondary | ICD-10-CM | POA: Diagnosis not present

## 2022-05-28 DIAGNOSIS — R8271 Bacteriuria: Secondary | ICD-10-CM | POA: Diagnosis not present

## 2022-05-28 DIAGNOSIS — N3000 Acute cystitis without hematuria: Secondary | ICD-10-CM | POA: Diagnosis not present

## 2022-05-30 DIAGNOSIS — J301 Allergic rhinitis due to pollen: Secondary | ICD-10-CM | POA: Diagnosis not present

## 2022-05-30 DIAGNOSIS — J3081 Allergic rhinitis due to animal (cat) (dog) hair and dander: Secondary | ICD-10-CM | POA: Diagnosis not present

## 2022-05-30 DIAGNOSIS — J3089 Other allergic rhinitis: Secondary | ICD-10-CM | POA: Diagnosis not present

## 2022-06-04 ENCOUNTER — Other Ambulatory Visit: Payer: Self-pay | Admitting: Physician Assistant

## 2022-06-04 DIAGNOSIS — F5105 Insomnia due to other mental disorder: Secondary | ICD-10-CM

## 2022-06-06 DIAGNOSIS — J301 Allergic rhinitis due to pollen: Secondary | ICD-10-CM | POA: Diagnosis not present

## 2022-06-06 DIAGNOSIS — J3081 Allergic rhinitis due to animal (cat) (dog) hair and dander: Secondary | ICD-10-CM | POA: Diagnosis not present

## 2022-06-06 DIAGNOSIS — J3089 Other allergic rhinitis: Secondary | ICD-10-CM | POA: Diagnosis not present

## 2022-06-07 DIAGNOSIS — E7800 Pure hypercholesterolemia, unspecified: Secondary | ICD-10-CM | POA: Diagnosis not present

## 2022-06-07 DIAGNOSIS — K219 Gastro-esophageal reflux disease without esophagitis: Secondary | ICD-10-CM | POA: Diagnosis not present

## 2022-06-07 DIAGNOSIS — E119 Type 2 diabetes mellitus without complications: Secondary | ICD-10-CM | POA: Diagnosis not present

## 2022-06-07 DIAGNOSIS — I10 Essential (primary) hypertension: Secondary | ICD-10-CM | POA: Diagnosis not present

## 2022-06-08 ENCOUNTER — Ambulatory Visit: Payer: Medicare HMO | Admitting: Neurology

## 2022-06-08 DIAGNOSIS — G43709 Chronic migraine without aura, not intractable, without status migrainosus: Secondary | ICD-10-CM

## 2022-06-08 MED ORDER — ONABOTULINUMTOXINA 100 UNITS IJ SOLR
200.0000 [IU] | Freq: Once | INTRAMUSCULAR | Status: AC
  Administered 2022-06-08: 155 [IU] via INTRAMUSCULAR

## 2022-06-08 NOTE — Progress Notes (Signed)
[facility]  ° °Procedure Note Botox ° °Attending: Dr. Khaniyah Bezek ° °Preoperative Diagnosis(es): Chronic migraine ° °Consent obtained from: The patient °Benefits discussed included, but were not limited to decreased muscle tightness, increased joint range of motion, and decreased pain.  Risk discussed included, but were not limited pain and discomfort, bleeding, bruising, excessive weakness, venous thrombosis, muscle atrophy and dysphagia.  Anticipated outcomes of the procedure as well as he risks and benefits of the alternatives to the procedure, and the roles and tasks of the personnel to be involved, were discussed with the patient, and the patient consents to the procedure and agrees to proceed. A copy of the patient medication guide was given to the patient which explains the blackbox warning. ° °Patients identity and treatment sites confirmed Yes.  . ° °Details of Procedure: °Skin was cleaned with alcohol. Prior to injection, the needle plunger was aspirated to make sure the needle was not within a blood vessel.  There was no blood retrieved on aspiration.   ° °Following is a summary of the muscles injected  And the amount of Botulinum toxin used: ° °Dilution °200 units of Botox was reconstituted with 4 ml of preservative free normal saline. °Time of reconstitution: At the time of the office visit (<30 minutes prior to injection)  ° °Injections  °155 total units of Botox was injected with a 30 gauge needle. ° °Injection Sites: °L occipitalis: 15 units- 3 sites  °R occiptalis: 15 units- 3 sites ° °L upper trapezius: 15 units- 3 sites °R upper trapezius: 15 units- 3 sits          °L paraspinal: 10 units- 2 sites °R paraspinal: 10 units- 2 sites ° °Face °L frontalis(2 injection sites):10 units   °R frontalis(2 injection sites):10 units         °L corrugator: 5 units   °R corrugator: 5 units           °Procerus: 5 units   °L temporalis: 20 units °R temporalis: 20 units  ° °Agent:  °200 units of botulinum Type  A (Onobotulinum Toxin type A) was reconstituted with 4 ml of preservative free normal saline.  °Time of reconstitution: At the time of the office visit (<30 minutes prior to injection)  ° ° ° Total injected (Units):  155 ° Total wasted (Units):  45 ° °Patient tolerated procedure well without complications.   °Reinjection is anticipated in 3 months. ° ° °

## 2022-06-11 ENCOUNTER — Telehealth: Payer: Self-pay | Admitting: Neurology

## 2022-06-11 ENCOUNTER — Other Ambulatory Visit: Payer: Self-pay

## 2022-06-11 MED ORDER — ZONISAMIDE 100 MG PO CAPS: 200.0000 mg | ORAL_CAPSULE | Freq: Every day | ORAL | 5 refills | Status: DC

## 2022-06-11 NOTE — Telephone Encounter (Signed)
[facility] needs new Rx for zonisamide, they are having trouble transferring it back.

## 2022-06-11 NOTE — Telephone Encounter (Signed)
Sent prescription called pharmacy

## 2022-06-13 ENCOUNTER — Telehealth: Payer: Self-pay | Admitting: Neurology

## 2022-06-13 DIAGNOSIS — J301 Allergic rhinitis due to pollen: Secondary | ICD-10-CM | POA: Diagnosis not present

## 2022-06-13 DIAGNOSIS — G4730 Sleep apnea, unspecified: Secondary | ICD-10-CM | POA: Diagnosis not present

## 2022-06-13 DIAGNOSIS — J3081 Allergic rhinitis due to animal (cat) (dog) hair and dander: Secondary | ICD-10-CM | POA: Diagnosis not present

## 2022-06-13 DIAGNOSIS — J3089 Other allergic rhinitis: Secondary | ICD-10-CM | POA: Diagnosis not present

## 2022-06-13 DIAGNOSIS — R0683 Snoring: Secondary | ICD-10-CM | POA: Diagnosis not present

## 2022-06-13 MED ORDER — LIDOCAINE-PRILOCAINE 2.5-2.5 % EX CREA: 1.0000 | TOPICAL_CREAM | CUTANEOUS | 0 refills | Status: DC | PRN

## 2022-06-13 NOTE — Telephone Encounter (Signed)
I have dc the previous script and will place the order for the suggested medication as requested by the pharmacist

## 2022-06-13 NOTE — Telephone Encounter (Signed)
Pt is requesting Ambien and Seroquel refills to be sent to   [facility] - [place], [place] - [address]  [address], [place] [address]  Phone:  [phone]  Fax:  [phone]   She said previous prescriber was when she was in the hospital for brain surgery.  Next appt 10/26

## 2022-06-13 NOTE — Telephone Encounter (Signed)
Courtney @ [facility] will not pay for the lidocaine (XYLOCAINE) 5 % ointment.  They are suggesting lidocaine-prilocaine ointment, please call to discuss

## 2022-06-14 ENCOUNTER — Telehealth: Payer: Medicare HMO | Admitting: Psychiatry

## 2022-06-14 ENCOUNTER — Telehealth: Payer: Self-pay | Admitting: Psychiatry

## 2022-06-14 ENCOUNTER — Other Ambulatory Visit: Payer: Self-pay

## 2022-06-14 DIAGNOSIS — F5105 Insomnia due to other mental disorder: Secondary | ICD-10-CM

## 2022-06-14 DIAGNOSIS — F419 Anxiety disorder, unspecified: Secondary | ICD-10-CM

## 2022-06-14 MED ORDER — ZOLPIDEM TARTRATE 10 MG PO TABS: 10.0000 mg | ORAL_TABLET | Freq: Every day | ORAL | 5 refills | Status: DC

## 2022-06-14 MED ORDER — QUETIAPINE FUMARATE 300 MG PO TABS: 300.0000 mg | ORAL_TABLET | Freq: Every day | ORAL | 0 refills | Status: DC

## 2022-06-14 NOTE — Telephone Encounter (Signed)
I called [facility] to [place] her appt. Today and inquired about the refills of her Ambien and Seroquel.  She said when she was in the hospital forher brain surgery they took over her medication refills but she is afraid what they have done.  She wanted to make sure her refills for these medications where sent in.  The pharmacy is to be delivering her medications today.  Can you please make sure her refills have been done by someone.  And be sure Dr. Clovis Pu is the prescribing Dr.

## 2022-06-14 NOTE — Telephone Encounter (Signed)
Pended zolpidem to [facility]. Sent in Rx for Seroquel.

## 2022-06-18 ENCOUNTER — Telehealth: Payer: Self-pay

## 2022-06-18 ENCOUNTER — Encounter: Payer: Self-pay | Admitting: Neurology

## 2022-06-18 NOTE — Telephone Encounter (Signed)
Refill request for Zolpidem.

## 2022-06-19 ENCOUNTER — Other Ambulatory Visit: Payer: Self-pay | Admitting: Neurology

## 2022-06-19 DIAGNOSIS — J3081 Allergic rhinitis due to animal (cat) (dog) hair and dander: Secondary | ICD-10-CM | POA: Diagnosis not present

## 2022-06-19 DIAGNOSIS — E7800 Pure hypercholesterolemia, unspecified: Secondary | ICD-10-CM | POA: Diagnosis not present

## 2022-06-19 DIAGNOSIS — N390 Urinary tract infection, site not specified: Secondary | ICD-10-CM | POA: Diagnosis not present

## 2022-06-19 DIAGNOSIS — G4733 Obstructive sleep apnea (adult) (pediatric): Secondary | ICD-10-CM

## 2022-06-19 DIAGNOSIS — J4540 Moderate persistent asthma, uncomplicated: Secondary | ICD-10-CM

## 2022-06-19 DIAGNOSIS — F329 Major depressive disorder, single episode, unspecified: Secondary | ICD-10-CM | POA: Diagnosis not present

## 2022-06-19 DIAGNOSIS — K5909 Other constipation: Secondary | ICD-10-CM | POA: Diagnosis not present

## 2022-06-19 DIAGNOSIS — G43709 Chronic migraine without aura, not intractable, without status migrainosus: Secondary | ICD-10-CM

## 2022-06-19 DIAGNOSIS — E1169 Type 2 diabetes mellitus with other specified complication: Secondary | ICD-10-CM | POA: Diagnosis not present

## 2022-06-19 DIAGNOSIS — Z9889 Other specified postprocedural states: Secondary | ICD-10-CM | POA: Diagnosis not present

## 2022-06-19 DIAGNOSIS — J3089 Other allergic rhinitis: Secondary | ICD-10-CM | POA: Diagnosis not present

## 2022-06-19 DIAGNOSIS — E119 Type 2 diabetes mellitus without complications: Secondary | ICD-10-CM | POA: Diagnosis not present

## 2022-06-19 DIAGNOSIS — Z789 Other specified health status: Secondary | ICD-10-CM

## 2022-06-19 DIAGNOSIS — E559 Vitamin D deficiency, unspecified: Secondary | ICD-10-CM | POA: Diagnosis not present

## 2022-06-19 DIAGNOSIS — J301 Allergic rhinitis due to pollen: Secondary | ICD-10-CM | POA: Diagnosis not present

## 2022-06-19 DIAGNOSIS — R262 Difficulty in walking, not elsewhere classified: Secondary | ICD-10-CM | POA: Diagnosis not present

## 2022-06-20 NOTE — Telephone Encounter (Signed)
Refilled 10/26 by a NP- I don't usually refill this medicine- ML

## 2022-06-27 DIAGNOSIS — J3089 Other allergic rhinitis: Secondary | ICD-10-CM | POA: Diagnosis not present

## 2022-06-27 DIAGNOSIS — J301 Allergic rhinitis due to pollen: Secondary | ICD-10-CM | POA: Diagnosis not present

## 2022-06-27 DIAGNOSIS — J3081 Allergic rhinitis due to animal (cat) (dog) hair and dander: Secondary | ICD-10-CM | POA: Diagnosis not present

## 2022-06-28 DIAGNOSIS — M6281 Muscle weakness (generalized): Secondary | ICD-10-CM | POA: Diagnosis not present

## 2022-06-28 DIAGNOSIS — R2681 Unsteadiness on feet: Secondary | ICD-10-CM | POA: Diagnosis not present

## 2022-06-28 DIAGNOSIS — Z7409 Other reduced mobility: Secondary | ICD-10-CM | POA: Diagnosis not present

## 2022-06-28 DIAGNOSIS — R293 Abnormal posture: Secondary | ICD-10-CM | POA: Diagnosis not present

## 2022-07-02 DIAGNOSIS — J4520 Mild intermittent asthma, uncomplicated: Secondary | ICD-10-CM | POA: Diagnosis not present

## 2022-07-02 DIAGNOSIS — E7800 Pure hypercholesterolemia, unspecified: Secondary | ICD-10-CM | POA: Diagnosis not present

## 2022-07-02 DIAGNOSIS — I10 Essential (primary) hypertension: Secondary | ICD-10-CM | POA: Diagnosis not present

## 2022-07-02 DIAGNOSIS — G43909 Migraine, unspecified, not intractable, without status migrainosus: Secondary | ICD-10-CM | POA: Diagnosis not present

## 2022-07-02 DIAGNOSIS — Z23 Encounter for immunization: Secondary | ICD-10-CM | POA: Diagnosis not present

## 2022-07-02 DIAGNOSIS — R600 Localized edema: Secondary | ICD-10-CM | POA: Diagnosis not present

## 2022-07-02 DIAGNOSIS — M797 Fibromyalgia: Secondary | ICD-10-CM | POA: Diagnosis not present

## 2022-07-02 DIAGNOSIS — G894 Chronic pain syndrome: Secondary | ICD-10-CM | POA: Diagnosis not present

## 2022-07-02 DIAGNOSIS — E119 Type 2 diabetes mellitus without complications: Secondary | ICD-10-CM | POA: Diagnosis not present

## 2022-07-02 NOTE — Progress Notes (Deleted)
NEUROLOGY FOLLOW UP OFFICE NOTE  [facility] [id number]  Assessment/Plan:   1.Vestibular migraine/chronic migraine without aura, without status migrainosus, not intractable 2. Cerebellar hemangioblastoma, grade 1 status post resection  3. Depression and anxiety  4  Black out spell (she zoned out) - unclear etiology.  While in rehab, she had episode of shaking which appeared to be restlessness in bed possibly due to her apnea.  No loss of consciousness.  Semiology not likely to be seizure.   Migraine prevention:  Botox. Every 3 months, zonidamide '200mg'$  daily Rizatriptan '10mg'$  for migraine prevention Limit use of pain relievers to no more than 2 days out of week to prevent risk of rebound or medication-overuse headache. Keep headache diary Advised following up with psychiatry and psychology. Follow up for Botox     Subjective:  Rebbecca Osuna is a 61 year old right-handed female with cerebellar hemangioblastoma, diabetes, chronic back pain, and IBS who follows up for migraine.   UPDATE: ***   Current NSAIDS/analgesics:  Hydrocodone-acetaminophen (pain), diclofenac '75mg'$  Current triptans:  Maxalt '10mg'$  Current ergotamine:  none Current anti-emetic:  Zofran ODT '4mg'$  Current muscle relaxants:  Robaxin Current Antihypertensive medications:  Metoprolol succinate, amlodipine Current Antidepressant medications:  none Current Anticonvulsant medications: gabapentin '300mg'$  BID, oxcarbazepine '900mg'$  at bedtime (for chronic pain), zonisamide '200mg'$  daily Current anti-CGRP:  none Current Vitamins/Herbal/Supplements:  D, melatonin Current Antihistamines/Decongestants:  Meclizine '25mg'$  PRN, Benadryl '50mg'$  QHS, Flonase Other therapy:  Botox Hormone/birth control:  none Other medications:  Ambien, Seroquel   Caffeine:  No coffee.  Occasional Coke Diet:  1 gallon water daily.  Tries not to skip meals.   Exercise:  Unable due to pain, nausea and dizziness Depression/Anxiety:  yes Other  pain:  fibromyalgia Sleep hygiene: Found to have mild OSA but with severe sleep hypoxia.  Started CPAP   HISTORY:  She has had migraines for many years.  They were manageable, usually occurring once a month.  They started to become more frequent in 2020, progressing to the point that she now has a persistent daily headache.  They are typically bifrontal and pounding, associated with nausea, vomiting, vertigo, photophobia, phonophobia and blurred vision but no numbness or weakness.  Intensity will fluctuate from dull-moderate to severe about once a week for 2-3 days.  Sunlight, loud noise and quick movements are aggravating factors.  Resting in a dark and cool quiet room helps relieve pain.  She cannot really identify a specific trigger for her chronic daily headache but it may have followed a fall in which she tripped and hit her head.   She treats headache with sumatriptan (2-3 days a week) and treats nausea with Zofran and dizziness with meclizine.  She also takes hydrocodone for fibromyalgia.    MRI of brain with and without contrast on 09/03/2020 showed enhancing 8 x 6 mm posterior fossa mass left of midline with edema within the superior cerebellum.  Follow up MRI on 09/24/2020 was stable.  She was referred to neurosurgery who favored to closely monitor.  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 Repeat MRI of brain with and without contrast on 09/03/2021 personally reviewed showed increase size of the left superior cerebellar vermis mass from 1.1 x 1.0 cm to 1.5 x 1.5 cm with mild increased surrounding edema.  Plan was to undergo surgery with Dr. Ronnald Ramp.  However, she presented to [facility] in [place] with tremors and increased headaches.  She underwent craniotomy and  tumor resection there with Dr. Recardo Evangelist of neurosurgery.  Postoperative MRI showed small area of diffusion restriction in the cerebellum.  No complications.  Biopsy  revealed grade 1  hemangioblastoma.  She was discharged to inpatient rehab where it was noted to have bilateral upper and lower extremity thrashing without loss of consciousness at night while in bed.  Evalauted by neurology who did not suspect seizure but rather secondary to her untreated sleep apnea. Started CPAP.  She finds Botox to be helpful.  Headaches are no longer diffuse, just left sided although it can be painful.  Occurring 15 days a month.  She reports that she had an episode where she "blacked out".  She was sitting watching TV while talking to a friend on the phone and the next thing she knew, something else was playing on the TV suggesting time elapsed.  She was still on the phone with her friend. Started having worsening headache in June 2023.  Seen in ED on 02/07/2022.  CTA head personally reviewed was unremarkable.  MRI of brain with and without contrast personally reviewed limited by motion artifact but showed no evidence of residual or recurrent mass in the resection within the left cerebellar hemisphere.     Past NSAIDS/analgesics:  Ibuprofen, naproxen, Excedrin/BC/Goody Past abortive triptans:  sumatriptan  Past abortive ergotamine:  none Past muscle relaxants:  Flexeril Past anti-emetic:  Promethazine '25mg'$  Past antihypertensive medications:  none Past antidepressant medications:  Amitriptyline or nortriptyline, venlafaxine Past anticonvulsant medications:  topiramate, gabapentin Past anti-CGRP:  Emgality Past vitamins/Herbal/Supplements:  none Past antihistamines/decongestants:  none Other past therapies:  vestibular rehab     Family history of headache:  unknown  PAST MEDICAL HISTORY: Past Medical History:  Diagnosis Date   Allergies    Anemia    Arthritis    Asthma    Back pain    Chronic pain    Complication of anesthesia    woke up during colonoscopy   Diabetes ([place])    Diabetes mellitus without complication (HCC)    Edema, lower extremity    Fibroid     Fibromyalgia    Chronic   GERD (gastroesophageal reflux disease)    Headache    migraines   High blood pressure    History of hiatal hernia    History of stomach ulcers    IBS (irritable bowel syndrome)    Joint pain    Sleep apnea    did use cpap-lost 25lb-says she does not need it   NO CPAP   Wears contact lenses     MEDICATIONS: Current Outpatient Medications on File Prior to Visit  Medication Sig Dispense Refill   acetaminophen (TYLENOL) 325 MG tablet Take 1-2 tablets (325-650 mg total) by mouth every 4 (four) hours as needed for mild pain.     albuterol (PROVENTIL) (2.5 MG/3ML) 0.083% nebulizer solution Take 3 mLs (2.5 mg total) by nebulization every 6 (six) hours as needed for up to 14 days for wheezing or shortness of breath. 75 mL 0   amLODipine (NORVASC) 5 MG tablet Take 1 tablet (5 mg total) by mouth daily. (Patient taking differently: Take 10 mg by mouth daily.) 30 tablet 0   atorvastatin (LIPITOR) 10 MG tablet Take 1 tablet (10 mg total) by mouth at bedtime. 30 tablet 0   azelastine (ASTELIN) 0.1 % nasal spray Place 1 spray into both nostrils 2 (two) times daily as needed for rhinitis. 30 mL 0   BREO ELLIPTA 200-25 MCG/ACT AEPB Inhale 1  puff into the lungs daily.     cyclobenzaprine (FLEXERIL) 10 MG tablet Take 20 mg by mouth at bedtime.     diclofenac (VOLTAREN) 75 MG EC tablet Take 75 mg by mouth 2 (two) times daily.     EPINEPHrine 0.3 mg/0.3 mL IJ SOAJ injection Inject 0.3 mg into the muscle as needed for anaphylaxis.     fluticasone (FLONASE) 50 MCG/ACT nasal spray Place 1 spray into both nostrils 2 (two) times daily as needed for allergies.     furosemide (LASIX) 20 MG tablet Take 20 mg by mouth daily as needed for fluid.     gabapentin (NEURONTIN) 300 MG capsule Take 300 mg by mouth 2 (two) times daily.     HYDROcodone-acetaminophen (NORCO/VICODIN) 5-325 MG tablet Take 2 tablets by mouth at bedtime.     levocetirizine (XYZAL) 5 MG tablet Take 1 tablet (5 mg total) by  mouth every evening. 30 tablet 0   lidocaine-prilocaine (EMLA) cream Apply 1 Application topically as needed. 30 g 0   linaclotide (LINZESS) 72 MCG capsule Take 1 capsule (72 mcg total) by mouth daily as needed (constipation). 90 capsule 0   meclizine (ANTIVERT) 12.5 MG tablet Take 12.5 mg by mouth 3 (three) times daily as needed.     meclizine (ANTIVERT) 25 MG tablet Take 1 tablet (25 mg total) by mouth 3 (three) times daily as needed for dizziness. 60 tablet 0   melatonin 5 MG TABS Take 1 tablet (5 mg total) by mouth at bedtime. 30 tablet 0   methocarbamol (ROBAXIN) 750 MG tablet Take 1 tablet (750 mg total) by mouth every 8 (eight) hours as needed for muscle spasms. (Patient taking differently: Take 750 mg by mouth 3 (three) times daily.) 60 tablet 0   montelukast (SINGULAIR) 10 MG tablet Take 1 tablet (10 mg total) by mouth at bedtime. 30 tablet 0   NON FORMULARY CPAP at bedtime     ondansetron (ZOFRAN ODT) 4 MG disintegrating tablet Take 1 tablet (4 mg total) by mouth every 6 (six) hours as needed for nausea or vomiting. 20 tablet 0   ondansetron (ZOFRAN-ODT) 4 MG disintegrating tablet TAKE ONE TABLET BY MOUTH EVERY 8 HOURS AS NEEDED FOR NAUSEA AND VOMITING 90 tablet 5   Oxcarbazepine (TRILEPTAL) 300 MG tablet Take 3 tablets (900 mg total) by mouth at bedtime. For nerve pain 270 tablet 1   pantoprazole (PROTONIX) 40 MG tablet Take 1 tablet (40 mg total) by mouth daily. 30 tablet 0   polyethylene glycol (MIRALAX / GLYCOLAX) 17 g packet Take 17 g by mouth daily as needed for mild constipation. 14 each 0   QUEtiapine (SEROQUEL) 300 MG tablet Take 1 tablet (300 mg total) by mouth at bedtime. 90 tablet 0   Semaglutide,0.25 or 0.'5MG'$ /DOS, (OZEMPIC, 0.25 OR 0.5 MG/DOSE,) 2 MG/1.5ML SOPN Inject 0.5 mg into the skin once a week. (Patient taking differently: Inject 0.5 mg into the skin every Sunday.) 1.5 mL 0   senna-docusate (SENOKOT-S) 8.6-50 MG tablet Take 2 tablets by mouth at bedtime. 60 tablet 0    Tetrahydrozoline HCl (VISINE OP) Place 1 drop into both eyes 2 (two) times daily as needed (dry eyes).     Vitamin D, Ergocalciferol, (DRISDOL) 1.25 MG (50000 UNIT) CAPS capsule TAKE ONE CAPSULE BY MOUTH EVERY 7 DAYS (Patient taking differently: Take 50,000 Units by mouth every Sunday.) 4 capsule 0   zolpidem (AMBIEN) 10 MG tablet Take 1 tablet (10 mg total) by mouth at bedtime. 30 tablet 5  zonisamide (ZONEGRAN) 100 MG capsule Take 2 capsules (200 mg total) by mouth daily. 30 capsule 5   No current facility-administered medications on file prior to visit.    ALLERGIES: Allergies  Allergen Reactions   Rocephin [Ceftriaxone] Anaphylaxis   Morphine And Related Itching and Nausea And Vomiting    Doesn't work   Prednisone Itching and Swelling   Sulfa Antibiotics Itching and Swelling   Amitriptyline Other (See Comments)   Penicillin G Sodium Other (See Comments)    FAMILY HISTORY: Family History  Problem Relation Age of Onset   Obesity Mother    Diabetes Father    High blood pressure Father    Sudden death Father    Breast cancer Paternal Grandmother    Breast cancer Paternal Aunt       Objective:  *** General: No acute distress.  Patient appears well-groomed.   Head:  Normocephalic/atraumatic Eyes:  Fundi examined but not visualized Neck: supple, no paraspinal tenderness, full range of motion Heart:  Regular rate and rhythm Neurological Exam: alert and oriented to person, place, and time.  Speech fluent and not dysarthric, language intact.  CN II-XII intact. Bulk and tone normal, muscle strength 5/5 throughout.  Sensation to light touch intact.  Deep tendon reflexes 2+ throughout, toes downgoing.  Finger to nose testing intact.  Gait normal, Romberg negative.   Metta Clines, DO  CC: ***

## 2022-07-03 ENCOUNTER — Ambulatory Visit: Payer: Medicare HMO | Admitting: Neurology

## 2022-07-03 DIAGNOSIS — J3081 Allergic rhinitis due to animal (cat) (dog) hair and dander: Secondary | ICD-10-CM | POA: Diagnosis not present

## 2022-07-03 DIAGNOSIS — J3089 Other allergic rhinitis: Secondary | ICD-10-CM | POA: Diagnosis not present

## 2022-07-03 DIAGNOSIS — J301 Allergic rhinitis due to pollen: Secondary | ICD-10-CM | POA: Diagnosis not present

## 2022-07-04 DIAGNOSIS — R2681 Unsteadiness on feet: Secondary | ICD-10-CM | POA: Diagnosis not present

## 2022-07-04 DIAGNOSIS — Z7409 Other reduced mobility: Secondary | ICD-10-CM | POA: Diagnosis not present

## 2022-07-04 DIAGNOSIS — R293 Abnormal posture: Secondary | ICD-10-CM | POA: Diagnosis not present

## 2022-07-04 DIAGNOSIS — M6281 Muscle weakness (generalized): Secondary | ICD-10-CM | POA: Diagnosis not present

## 2022-07-05 DIAGNOSIS — J4520 Mild intermittent asthma, uncomplicated: Secondary | ICD-10-CM | POA: Diagnosis not present

## 2022-07-05 DIAGNOSIS — K219 Gastro-esophageal reflux disease without esophagitis: Secondary | ICD-10-CM | POA: Diagnosis not present

## 2022-07-05 DIAGNOSIS — E1169 Type 2 diabetes mellitus with other specified complication: Secondary | ICD-10-CM | POA: Diagnosis not present

## 2022-07-05 DIAGNOSIS — I10 Essential (primary) hypertension: Secondary | ICD-10-CM | POA: Diagnosis not present

## 2022-07-05 DIAGNOSIS — E7800 Pure hypercholesterolemia, unspecified: Secondary | ICD-10-CM | POA: Diagnosis not present

## 2022-07-05 DIAGNOSIS — E119 Type 2 diabetes mellitus without complications: Secondary | ICD-10-CM | POA: Diagnosis not present

## 2022-07-06 DIAGNOSIS — R293 Abnormal posture: Secondary | ICD-10-CM | POA: Diagnosis not present

## 2022-07-06 DIAGNOSIS — M6281 Muscle weakness (generalized): Secondary | ICD-10-CM | POA: Diagnosis not present

## 2022-07-06 DIAGNOSIS — R2681 Unsteadiness on feet: Secondary | ICD-10-CM | POA: Diagnosis not present

## 2022-07-06 DIAGNOSIS — Z7409 Other reduced mobility: Secondary | ICD-10-CM | POA: Diagnosis not present

## 2022-07-06 DIAGNOSIS — R6889 Other general symptoms and signs: Secondary | ICD-10-CM | POA: Diagnosis not present

## 2022-07-08 DIAGNOSIS — R2681 Unsteadiness on feet: Secondary | ICD-10-CM | POA: Diagnosis not present

## 2022-07-08 DIAGNOSIS — M6281 Muscle weakness (generalized): Secondary | ICD-10-CM | POA: Diagnosis not present

## 2022-07-08 DIAGNOSIS — R293 Abnormal posture: Secondary | ICD-10-CM | POA: Diagnosis not present

## 2022-07-08 DIAGNOSIS — R6889 Other general symptoms and signs: Secondary | ICD-10-CM | POA: Diagnosis not present

## 2022-07-08 DIAGNOSIS — Z7409 Other reduced mobility: Secondary | ICD-10-CM | POA: Diagnosis not present

## 2022-07-10 DIAGNOSIS — J301 Allergic rhinitis due to pollen: Secondary | ICD-10-CM | POA: Diagnosis not present

## 2022-07-10 DIAGNOSIS — J3081 Allergic rhinitis due to animal (cat) (dog) hair and dander: Secondary | ICD-10-CM | POA: Diagnosis not present

## 2022-07-10 DIAGNOSIS — J3089 Other allergic rhinitis: Secondary | ICD-10-CM | POA: Diagnosis not present

## 2022-07-10 NOTE — Progress Notes (Unsigned)
NEUROLOGY FOLLOW UP OFFICE NOTE  Virginia Luna [id number]  Assessment/Plan:   1.Vestibular migraine/chronic migraine without aura, without status migrainosus, not intractable 2. Cerebellar hemangioblastoma, grade 1 status post resection  3. Depression and anxiety  4  Black out spell (she zoned out) - unclear etiology.  While in rehab, she had episode of shaking which appeared to be restlessness in bed possibly due to her apnea.  No loss of consciousness.  Semiology not likely to be seizure.   Migraine prevention:  Botox. Every 3 months, zonidamide '200mg'$  daily Rizatriptan '10mg'$  for migraine prevention Limit use of pain relievers to no more than 2 days out of week to prevent risk of rebound or medication-overuse headache. Keep headache diary Advised following up with psychiatry and psychology. Follow up for Botox     Subjective:  Virginia Luna is a 61 year old right-handed female with cerebellar hemangioblastoma, diabetes, chronic back pain, and IBS who follows up for migraine.   UPDATE: She is under a lot of stress and generalized pain.  Pain is limiing movement of her left shoulder.  This has aggravated her headaches and migraines.  Going to PT to help with balance.  No recent episodes of black out spells.   Current NSAIDS/analgesics:  Hydrocodone-acetaminophen (pain), diclofenac '75mg'$  Current triptans:  Maxalt '10mg'$  Current ergotamine:  none Current anti-emetic:  Zofran ODT '4mg'$  Current muscle relaxants:  Robaxin Current Antihypertensive medications:  Metoprolol succinate, amlodipine Current Antidepressant medications:  none Current Anticonvulsant medications: gabapentin '300mg'$  BID, oxcarbazepine '900mg'$  at bedtime (for chronic pain), zonisamide '200mg'$  daily Current anti-CGRP:  none Current Vitamins/Herbal/Supplements:  D, melatonin Current Antihistamines/Decongestants:  Meclizine '25mg'$  PRN, Benadryl '50mg'$  QHS, Flonase Other therapy:  Botox Hormone/birth control:  none Other  medications:  Ambien, Seroquel   Caffeine:  No coffee.  Occasional Coke Diet:  1 gallon water daily.  Tries not to skip meals.   Exercise:  Unable due to pain, nausea and dizziness Depression/Anxiety:  yes Other pain:  fibromyalgia - pain is severe everyday.   Sleep hygiene: Found to have mild OSA but with severe sleep hypoxia.  Started CPAP.  Pain affects her sleep.     HISTORY:  She has had migraines for many years.  They were manageable, usually occurring once a month.  They started to become more frequent in 2020, progressing to the point that she now has a persistent daily headache.  They are typically bifrontal and pounding, associated with nausea, vomiting, vertigo, photophobia, phonophobia and blurred vision but no numbness or weakness.  Intensity will fluctuate from dull-moderate to severe about once a week for 2-3 days.  Sunlight, loud noise and quick movements are aggravating factors.  Resting in a dark and cool quiet room helps relieve pain.  She cannot really identify a specific trigger for her chronic daily headache but it may have followed a fall in which she tripped and hit her head.   She treats headache with sumatriptan (2-3 days a week) and treats nausea with Zofran and dizziness with meclizine.  She also takes hydrocodone for fibromyalgia.    MRI of brain with and without contrast on 09/03/2020 showed enhancing 8 x 6 mm posterior fossa mass left of midline with edema within the superior cerebellum.  Follow up MRI on 09/24/2020 was stable.  She was referred to neurosurgery who favored to closely monitor.  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 Repeat MRI of brain with and without contrast on  09/03/2021 personally reviewed showed increase size of the left superior cerebellar vermis mass from 1.1 x 1.0 cm to 1.5 x 1.5 cm with mild increased surrounding edema.  Plan was to undergo surgery with Dr. Ronnald Ramp.  However, she  presented to [facility] in [place] with tremors and increased headaches.  She underwent craniotomy and tumor resection there with Dr. Recardo Evangelist of neurosurgery.  Postoperative MRI showed small area of diffusion restriction in the cerebellum.  No complications.  Biopsy revealed grade 1  hemangioblastoma.  She was discharged to inpatient rehab where it was noted to have bilateral upper and lower extremity thrashing without loss of consciousness at night while in bed.  Evalauted by neurology who did not suspect seizure but rather secondary to her untreated sleep apnea. Started CPAP.  She finds Botox to be helpful.  Headaches are no longer diffuse, just left sided although it can be painful.  Occurring 15 days a month.  She reports that she had an episode where she "blacked out".  She was sitting watching TV while talking to a friend on the phone and the next thing she knew, something else was playing on the TV suggesting time elapsed.  She was still on the phone with her friend. Started having worsening headache in June 2023.  Seen in ED on 02/07/2022.  CTA head personally reviewed was unremarkable.  MRI of brain with and without contrast personally reviewed limited by motion artifact but showed no evidence of residual or recurrent mass in the resection within the left cerebellar hemisphere.     Past NSAIDS/analgesics:  Ibuprofen, naproxen, Excedrin/BC/Goody Past abortive triptans:  sumatriptan  Past abortive ergotamine:  none Past muscle relaxants:  Flexeril Past anti-emetic:  Promethazine '25mg'$  Past antihypertensive medications:  none Past antidepressant medications:  Amitriptyline or nortriptyline, venlafaxine Past anticonvulsant medications:  topiramate, gabapentin Past anti-CGRP:  Emgality Past vitamins/Herbal/Supplements:  none Past antihistamines/decongestants:  none Other past therapies:  vestibular rehab     Family history of headache:  unknown  PAST MEDICAL HISTORY: Past Medical  History:  Diagnosis Date   Allergies    Anemia    Arthritis    Asthma    Back pain    Chronic pain    Complication of anesthesia    woke up during colonoscopy   Diabetes ([place])    Diabetes mellitus without complication (H[facility])    Edema, lower extremity    Fibroid    Fibromyalgia    Chronic   GERD (gastroesophageal reflux disease)    Headache    migraines   High blood pressure    History of hiatal hernia    History of stomach ulcers    IBS (irritable bowel syndrome)    Joint pain    Sleep apnea    did use cpap-lost 25lb-says she does not need it   NO CPAP   Wears contact lenses     MEDICATIONS: Current Outpatient Medications on File Prior to Visit  Medication Sig Dispense Refill   acetaminophen (TYLENOL) 325 MG tablet Take 1-2 tablets (325-650 mg total) by mouth every 4 (four) hours as needed for mild pain.     albuterol (PROVENTIL) (2.5 MG/3ML) 0.083% nebulizer solution Take 3 mLs (2.5 mg total) by nebulization every 6 (six) hours as needed for up to 14 days for wheezing or shortness of breath. 75 mL 0   amLODipine (NORVASC) 5 MG tablet Take 1 tablet (5 mg total) by mouth daily. (Patient taking differently: Take 10 mg by mouth daily.) 30  tablet 0   atorvastatin (LIPITOR) 10 MG tablet Take 1 tablet (10 mg total) by mouth at bedtime. 30 tablet 0   azelastine (ASTELIN) 0.1 % nasal spray Place 1 spray into both nostrils 2 (two) times daily as needed for rhinitis. 30 mL 0   BREO ELLIPTA 200-25 MCG/ACT AEPB Inhale 1 puff into the lungs daily.     cyclobenzaprine (FLEXERIL) 10 MG tablet Take 20 mg by mouth at bedtime.     diclofenac (VOLTAREN) 75 MG EC tablet Take 75 mg by mouth 2 (two) times daily.     EPINEPHrine 0.3 mg/0.3 mL IJ SOAJ injection Inject 0.3 mg into the muscle as needed for anaphylaxis.     fluticasone (FLONASE) 50 MCG/ACT nasal spray Place 1 spray into both nostrils 2 (two) times daily as needed for allergies.     furosemide (LASIX) 20 MG tablet Take 20 mg by mouth  daily as needed for fluid.     gabapentin (NEURONTIN) 300 MG capsule Take 300 mg by mouth 2 (two) times daily.     HYDROcodone-acetaminophen (NORCO/VICODIN) 5-325 MG tablet Take 2 tablets by mouth at bedtime.     levocetirizine (XYZAL) 5 MG tablet Take 1 tablet (5 mg total) by mouth every evening. 30 tablet 0   lidocaine-prilocaine (EMLA) cream Apply 1 Application topically as needed. 30 g 0   linaclotide (LINZESS) 72 MCG capsule Take 1 capsule (72 mcg total) by mouth daily as needed (constipation). 90 capsule 0   meclizine (ANTIVERT) 12.5 MG tablet Take 12.5 mg by mouth 3 (three) times daily as needed.     meclizine (ANTIVERT) 25 MG tablet Take 1 tablet (25 mg total) by mouth 3 (three) times daily as needed for dizziness. 60 tablet 0   melatonin 5 MG TABS Take 1 tablet (5 mg total) by mouth at bedtime. 30 tablet 0   methocarbamol (ROBAXIN) 750 MG tablet Take 1 tablet (750 mg total) by mouth every 8 (eight) hours as needed for muscle spasms. (Patient taking differently: Take 750 mg by mouth 3 (three) times daily.) 60 tablet 0   montelukast (SINGULAIR) 10 MG tablet Take 1 tablet (10 mg total) by mouth at bedtime. 30 tablet 0   NON FORMULARY CPAP at bedtime     ondansetron (ZOFRAN ODT) 4 MG disintegrating tablet Take 1 tablet (4 mg total) by mouth every 6 (six) hours as needed for nausea or vomiting. 20 tablet 0   ondansetron (ZOFRAN-ODT) 4 MG disintegrating tablet TAKE ONE TABLET BY MOUTH EVERY 8 HOURS AS NEEDED FOR NAUSEA AND VOMITING 90 tablet 5   Oxcarbazepine (TRILEPTAL) 300 MG tablet Take 3 tablets (900 mg total) by mouth at bedtime. For nerve pain 270 tablet 1   pantoprazole (PROTONIX) 40 MG tablet Take 1 tablet (40 mg total) by mouth daily. 30 tablet 0   polyethylene glycol (MIRALAX / GLYCOLAX) 17 g packet Take 17 g by mouth daily as needed for mild constipation. 14 each 0   QUEtiapine (SEROQUEL) 300 MG tablet Take 1 tablet (300 mg total) by mouth at bedtime. 90 tablet 0   Semaglutide,0.25 or  0.'5MG'$ /DOS, (OZEMPIC, 0.25 OR 0.5 MG/DOSE,) 2 MG/1.5ML SOPN Inject 0.5 mg into the skin once a week. (Patient taking differently: Inject 0.5 mg into the skin every Sunday.) 1.5 mL 0   senna-docusate (SENOKOT-S) 8.6-50 MG tablet Take 2 tablets by mouth at bedtime. 60 tablet 0   Tetrahydrozoline HCl (VISINE OP) Place 1 drop into both eyes 2 (two) times daily as needed (dry  eyes).     Vitamin D, Ergocalciferol, (DRISDOL) 1.25 MG (50000 UNIT) CAPS capsule TAKE ONE CAPSULE BY MOUTH EVERY 7 DAYS (Patient taking differently: Take 50,000 Units by mouth every Sunday.) 4 capsule 0   zolpidem (AMBIEN) 10 MG tablet Take 1 tablet (10 mg total) by mouth at bedtime. 30 tablet 5   zonisamide (ZONEGRAN) 100 MG capsule Take 2 capsules (200 mg total) by mouth daily. 30 capsule 5   No current facility-administered medications on file prior to visit.    ALLERGIES: Allergies  Allergen Reactions   Rocephin [Ceftriaxone] Anaphylaxis   Morphine And Related Itching and Nausea And Vomiting    Doesn't work   Prednisone Itching and Swelling   Sulfa Antibiotics Itching and Swelling   Amitriptyline Other (See Comments)   Penicillin G Sodium Other (See Comments)    FAMILY HISTORY: Family History  Problem Relation Age of Onset   Obesity Mother    Diabetes Father    High blood pressure Father    Sudden death Father    Breast cancer Paternal Grandmother    Breast cancer Paternal Aunt       Objective:  Blood pressure (!) 150/89, pulse 90, height '5\' 4"'$  (1.626 m), weight 272 lb 6.4 oz (123.6 kg), SpO2 95 %. General: No acute distress.  Patient appears well-groomed.      Metta Clines, DO  CC: Shirline Frees, MD

## 2022-07-11 ENCOUNTER — Ambulatory Visit (INDEPENDENT_AMBULATORY_CARE_PROVIDER_SITE_OTHER): Payer: Medicare HMO | Admitting: Neurology

## 2022-07-11 ENCOUNTER — Encounter: Payer: Self-pay | Admitting: Physical Medicine and Rehabilitation

## 2022-07-11 ENCOUNTER — Encounter: Payer: Self-pay | Admitting: Neurology

## 2022-07-11 ENCOUNTER — Encounter: Payer: Medicare HMO | Attending: Physical Medicine and Rehabilitation | Admitting: Physical Medicine and Rehabilitation

## 2022-07-11 VITALS — BP 154/95 | Ht 64.0 in | Wt 272.0 lb

## 2022-07-11 VITALS — BP 150/89 | HR 90 | Ht 64.0 in | Wt 272.4 lb

## 2022-07-11 DIAGNOSIS — M7918 Myalgia, other site: Secondary | ICD-10-CM | POA: Diagnosis not present

## 2022-07-11 DIAGNOSIS — D432 Neoplasm of uncertain behavior of brain, unspecified: Secondary | ICD-10-CM | POA: Diagnosis not present

## 2022-07-11 DIAGNOSIS — G894 Chronic pain syndrome: Secondary | ICD-10-CM | POA: Insufficient documentation

## 2022-07-11 DIAGNOSIS — M797 Fibromyalgia: Secondary | ICD-10-CM | POA: Diagnosis not present

## 2022-07-11 DIAGNOSIS — G43709 Chronic migraine without aura, not intractable, without status migrainosus: Secondary | ICD-10-CM | POA: Diagnosis not present

## 2022-07-11 MED ORDER — LIDOCAINE HCL 1 % IJ SOLN: 6.0000 mL | Freq: Once | INTRAMUSCULAR | Status: DC

## 2022-07-11 NOTE — Patient Instructions (Signed)
No change in meds Rizatriptan Botox Zonisamide '200mg'$  daily

## 2022-07-11 NOTE — Progress Notes (Signed)
Pt is a 61 yr old female with hx of fibromyalgia- dx'd at age 20, DM2,- Ac1 6.2;  asthma; HTN, OSA, BMI is 50; Nodule in L superior cerebellum. Also has migraines- intractable and daily; has trace aura;  No kidney issues- Cr 0.65 and BUN 15 in 6/22. Also end stage OA of B/L knees medially and R hip pain. Also has myofascial pain syndrome.  Has new brain mass that was documented Worsening cerebellar Sx's. 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    Pain is "off the charts"  Had heating pad on L shoulder- was taken off.  L shoulder - in the muscles R knee- and R distal lateral thigh-  Uses theracane- hard to handle the pain of theracane-  Having neck and shoulder pain.   TrP injections help the pain, however.   Back of L thigh- thought things were wet- nothing wet- it's a side effect from cerebellar surgery per pt.    [place] last month- "not falling as often"- where hurt R leg.  But back to PT and still working with them Plan: Can start with less pressure with theracane - but by end of 2 minutes- needs to have decent pressure on the spots you are working on- hold pressure at least 2 minutes.    2. Migraines per Dr Tomi Likens- per [facility].    3. Patient here for trigger point injections for  Consent done and on chart.  Cleaned areas with alcohol and injected using a 27 gauge 1.5 inch needle  3. Injected 6cc- no wastage at all Using 1% Lidocaine with no EPI  Upper traps B/L  Levators- B/L  Posterior scalenes Middle scalenes- B/L  Splenius Capitus- B/L  Pectoralis Major- B/L  Rhomboids B/L x2 Infraspinatus Teres Major/minor Thoracic paraspinals B/L  Lumbar paraspinals B/L  Other injections-    Patient's level of pain prior was off the charts-"'worse than 10" Current level of pain after injections is down to 9/10 right now  There was no bleeding or complications.  Patient was  advised to drink a lot of water on day after injections to flush system Will have increased soreness for 12-48 hours after injections.  Can use Lidocaine patches the day AFTER injections Can use theracane on day of injections in places didn't inject Can use heating pad 4-6 hours AFTER injections   4. Con't Oxcarbamazepine last refill in 10/23   5. F/U in q2 months-    I spent a total of   20 minutes on total care today- >50% coordination of care- due to 10 minutes on injections and 10 minutes educating pt as detailed above.

## 2022-07-11 NOTE — Patient Instructions (Signed)
Plan: Can start with less pressure with theracane - but by end of 2 minutes- needs to have decent pressure on the spots you are working on- hold pressure at least 2 minutes.    2. Migraines per Dr Tomi Likens- per [facility].    3. Patient here for trigger point injections for  Consent done and on chart.  Cleaned areas with alcohol and injected using a 27 gauge 1.5 inch needle  3. Injected 6cc- no wastage at all Using 1% Lidocaine with no EPI  Upper traps B/L  Levators- B/L  Posterior scalenes Middle scalenes- B/L  Splenius Capitus- B/L  Pectoralis Major- B/L  Rhomboids B/L x2 Infraspinatus Teres Major/minor Thoracic paraspinals B/L  Lumbar paraspinals B/L  Other injections-    Patient's level of pain prior was off the charts-"'worse than 10" Current level of pain after injections is down to 9/10 right now  There was no bleeding or complications.  Patient was advised to drink a lot of water on day after injections to flush system Will have increased soreness for 12-48 hours after injections.  Can use Lidocaine patches the day AFTER injections Can use theracane on day of injections in places didn't inject Can use heating pad 4-6 hours AFTER injections    4. F/U in 3 months-

## 2022-07-13 DIAGNOSIS — R6889 Other general symptoms and signs: Secondary | ICD-10-CM | POA: Diagnosis not present

## 2022-07-13 DIAGNOSIS — Z7409 Other reduced mobility: Secondary | ICD-10-CM | POA: Diagnosis not present

## 2022-07-13 DIAGNOSIS — R2681 Unsteadiness on feet: Secondary | ICD-10-CM | POA: Diagnosis not present

## 2022-07-13 DIAGNOSIS — M6281 Muscle weakness (generalized): Secondary | ICD-10-CM | POA: Diagnosis not present

## 2022-07-13 DIAGNOSIS — R293 Abnormal posture: Secondary | ICD-10-CM | POA: Diagnosis not present

## 2022-07-16 DIAGNOSIS — R2681 Unsteadiness on feet: Secondary | ICD-10-CM | POA: Diagnosis not present

## 2022-07-16 DIAGNOSIS — R293 Abnormal posture: Secondary | ICD-10-CM | POA: Diagnosis not present

## 2022-07-16 DIAGNOSIS — Z7409 Other reduced mobility: Secondary | ICD-10-CM | POA: Diagnosis not present

## 2022-07-16 DIAGNOSIS — M6281 Muscle weakness (generalized): Secondary | ICD-10-CM | POA: Diagnosis not present

## 2022-07-17 DIAGNOSIS — J3081 Allergic rhinitis due to animal (cat) (dog) hair and dander: Secondary | ICD-10-CM | POA: Diagnosis not present

## 2022-07-17 DIAGNOSIS — J3089 Other allergic rhinitis: Secondary | ICD-10-CM | POA: Diagnosis not present

## 2022-07-17 DIAGNOSIS — J301 Allergic rhinitis due to pollen: Secondary | ICD-10-CM | POA: Diagnosis not present

## 2022-07-18 DIAGNOSIS — R293 Abnormal posture: Secondary | ICD-10-CM | POA: Diagnosis not present

## 2022-07-18 DIAGNOSIS — M6281 Muscle weakness (generalized): Secondary | ICD-10-CM | POA: Diagnosis not present

## 2022-07-18 DIAGNOSIS — R2681 Unsteadiness on feet: Secondary | ICD-10-CM | POA: Diagnosis not present

## 2022-07-18 DIAGNOSIS — Z7409 Other reduced mobility: Secondary | ICD-10-CM | POA: Diagnosis not present

## 2022-07-20 DIAGNOSIS — R0683 Snoring: Secondary | ICD-10-CM | POA: Diagnosis not present

## 2022-07-20 DIAGNOSIS — G4730 Sleep apnea, unspecified: Secondary | ICD-10-CM | POA: Diagnosis not present

## 2022-07-23 ENCOUNTER — Telehealth: Payer: Self-pay | Admitting: *Deleted

## 2022-07-23 DIAGNOSIS — R2681 Unsteadiness on feet: Secondary | ICD-10-CM | POA: Diagnosis not present

## 2022-07-23 DIAGNOSIS — Z7409 Other reduced mobility: Secondary | ICD-10-CM | POA: Diagnosis not present

## 2022-07-23 DIAGNOSIS — M6281 Muscle weakness (generalized): Secondary | ICD-10-CM | POA: Diagnosis not present

## 2022-07-23 DIAGNOSIS — R293 Abnormal posture: Secondary | ICD-10-CM | POA: Diagnosis not present

## 2022-07-23 DIAGNOSIS — R6889 Other general symptoms and signs: Secondary | ICD-10-CM | POA: Diagnosis not present

## 2022-07-23 NOTE — Telephone Encounter (Signed)
I reviewed patient's pulse oximetry test report on behalf of Dr. Brett Fairy.    She had an overnight pulse oximetry test while on room air and PAP therapy on 07/20/2022, total duration of test time 4 hours and 52 minutes.  Average oxygen saturation was 92%, nadir was 87%.  Time below or at 88% saturation was 2 minutes and 52 seconds.    Please advise patient that she does not have any significant oxygen drops while on PAP therapy.  She is advised to continue with current treatment and follow-up as scheduled.  There is no obvious need for additional oxygen supplementation.

## 2022-07-24 NOTE — Telephone Encounter (Signed)
Called and left detailed message about results. Advised I also sent mychart message. Asked her to follow up if she has any further questions.

## 2022-07-25 ENCOUNTER — Encounter: Payer: Self-pay | Admitting: Family Medicine

## 2022-07-25 DIAGNOSIS — R2681 Unsteadiness on feet: Secondary | ICD-10-CM | POA: Diagnosis not present

## 2022-07-25 DIAGNOSIS — R6889 Other general symptoms and signs: Secondary | ICD-10-CM | POA: Diagnosis not present

## 2022-07-25 DIAGNOSIS — M6281 Muscle weakness (generalized): Secondary | ICD-10-CM | POA: Diagnosis not present

## 2022-07-25 DIAGNOSIS — J3089 Other allergic rhinitis: Secondary | ICD-10-CM | POA: Diagnosis not present

## 2022-07-25 DIAGNOSIS — R293 Abnormal posture: Secondary | ICD-10-CM | POA: Diagnosis not present

## 2022-07-25 DIAGNOSIS — J3081 Allergic rhinitis due to animal (cat) (dog) hair and dander: Secondary | ICD-10-CM | POA: Diagnosis not present

## 2022-07-25 DIAGNOSIS — Z7409 Other reduced mobility: Secondary | ICD-10-CM | POA: Diagnosis not present

## 2022-07-25 DIAGNOSIS — J301 Allergic rhinitis due to pollen: Secondary | ICD-10-CM | POA: Diagnosis not present

## 2022-07-26 DIAGNOSIS — Z6841 Body Mass Index (BMI) 40.0 and over, adult: Secondary | ICD-10-CM | POA: Diagnosis not present

## 2022-07-26 DIAGNOSIS — G4733 Obstructive sleep apnea (adult) (pediatric): Secondary | ICD-10-CM | POA: Diagnosis not present

## 2022-07-26 DIAGNOSIS — K5909 Other constipation: Secondary | ICD-10-CM | POA: Diagnosis not present

## 2022-07-26 DIAGNOSIS — E65 Localized adiposity: Secondary | ICD-10-CM | POA: Diagnosis not present

## 2022-07-30 DIAGNOSIS — R6889 Other general symptoms and signs: Secondary | ICD-10-CM | POA: Diagnosis not present

## 2022-07-30 DIAGNOSIS — R2681 Unsteadiness on feet: Secondary | ICD-10-CM | POA: Diagnosis not present

## 2022-07-30 DIAGNOSIS — M6281 Muscle weakness (generalized): Secondary | ICD-10-CM | POA: Diagnosis not present

## 2022-07-30 DIAGNOSIS — R293 Abnormal posture: Secondary | ICD-10-CM | POA: Diagnosis not present

## 2022-07-31 DIAGNOSIS — J3089 Other allergic rhinitis: Secondary | ICD-10-CM | POA: Diagnosis not present

## 2022-07-31 DIAGNOSIS — J3081 Allergic rhinitis due to animal (cat) (dog) hair and dander: Secondary | ICD-10-CM | POA: Diagnosis not present

## 2022-07-31 DIAGNOSIS — J301 Allergic rhinitis due to pollen: Secondary | ICD-10-CM | POA: Diagnosis not present

## 2022-08-01 DIAGNOSIS — R6889 Other general symptoms and signs: Secondary | ICD-10-CM | POA: Diagnosis not present

## 2022-08-01 DIAGNOSIS — R293 Abnormal posture: Secondary | ICD-10-CM | POA: Diagnosis not present

## 2022-08-01 DIAGNOSIS — M6281 Muscle weakness (generalized): Secondary | ICD-10-CM | POA: Diagnosis not present

## 2022-08-01 DIAGNOSIS — R2681 Unsteadiness on feet: Secondary | ICD-10-CM | POA: Diagnosis not present

## 2022-08-02 ENCOUNTER — Telehealth: Payer: Self-pay

## 2022-08-02 DIAGNOSIS — E7800 Pure hypercholesterolemia, unspecified: Secondary | ICD-10-CM | POA: Diagnosis not present

## 2022-08-02 DIAGNOSIS — E119 Type 2 diabetes mellitus without complications: Secondary | ICD-10-CM | POA: Diagnosis not present

## 2022-08-02 DIAGNOSIS — J4520 Mild intermittent asthma, uncomplicated: Secondary | ICD-10-CM | POA: Diagnosis not present

## 2022-08-02 DIAGNOSIS — K219 Gastro-esophageal reflux disease without esophagitis: Secondary | ICD-10-CM | POA: Diagnosis not present

## 2022-08-02 DIAGNOSIS — I10 Essential (primary) hypertension: Secondary | ICD-10-CM | POA: Diagnosis not present

## 2022-08-02 NOTE — Telephone Encounter (Signed)
[employer]-Benefit Verification [id number] Submitted!

## 2022-08-02 NOTE — Telephone Encounter (Signed)
Pharmacy Patient Advocate Encounter   Received notification  that prior authorization for Botox 200UNIT solution is required/requested.    PA submitted on 08/02/2022  via CoverMyMeds Key [id number]  Status is pending

## 2022-08-03 NOTE — Telephone Encounter (Signed)
Pharmacy Patient Advocate Encounter  Prior Authorization for Botox 200UNIT solution has been approved.    PA# [id number] Effective dates: 08/02/2022 through 08/20/2023

## 2022-08-06 DIAGNOSIS — R6889 Other general symptoms and signs: Secondary | ICD-10-CM | POA: Diagnosis not present

## 2022-08-06 DIAGNOSIS — R293 Abnormal posture: Secondary | ICD-10-CM | POA: Diagnosis not present

## 2022-08-06 DIAGNOSIS — M6281 Muscle weakness (generalized): Secondary | ICD-10-CM | POA: Diagnosis not present

## 2022-08-06 DIAGNOSIS — R2681 Unsteadiness on feet: Secondary | ICD-10-CM | POA: Diagnosis not present

## 2022-08-07 DIAGNOSIS — J3089 Other allergic rhinitis: Secondary | ICD-10-CM | POA: Diagnosis not present

## 2022-08-07 DIAGNOSIS — J3081 Allergic rhinitis due to animal (cat) (dog) hair and dander: Secondary | ICD-10-CM | POA: Diagnosis not present

## 2022-08-07 DIAGNOSIS — J301 Allergic rhinitis due to pollen: Secondary | ICD-10-CM | POA: Diagnosis not present

## 2022-08-08 DIAGNOSIS — M6281 Muscle weakness (generalized): Secondary | ICD-10-CM | POA: Diagnosis not present

## 2022-08-08 DIAGNOSIS — R2681 Unsteadiness on feet: Secondary | ICD-10-CM | POA: Diagnosis not present

## 2022-08-08 DIAGNOSIS — R6889 Other general symptoms and signs: Secondary | ICD-10-CM | POA: Diagnosis not present

## 2022-08-08 DIAGNOSIS — R293 Abnormal posture: Secondary | ICD-10-CM | POA: Diagnosis not present

## 2022-08-14 DIAGNOSIS — R293 Abnormal posture: Secondary | ICD-10-CM | POA: Diagnosis not present

## 2022-08-14 DIAGNOSIS — J3081 Allergic rhinitis due to animal (cat) (dog) hair and dander: Secondary | ICD-10-CM | POA: Diagnosis not present

## 2022-08-14 DIAGNOSIS — J301 Allergic rhinitis due to pollen: Secondary | ICD-10-CM | POA: Diagnosis not present

## 2022-08-14 DIAGNOSIS — R2681 Unsteadiness on feet: Secondary | ICD-10-CM | POA: Diagnosis not present

## 2022-08-14 DIAGNOSIS — J3089 Other allergic rhinitis: Secondary | ICD-10-CM | POA: Diagnosis not present

## 2022-08-14 DIAGNOSIS — M6281 Muscle weakness (generalized): Secondary | ICD-10-CM | POA: Diagnosis not present

## 2022-08-17 DIAGNOSIS — N3946 Mixed incontinence: Secondary | ICD-10-CM | POA: Diagnosis not present

## 2022-08-17 DIAGNOSIS — R8271 Bacteriuria: Secondary | ICD-10-CM | POA: Diagnosis not present

## 2022-08-17 DIAGNOSIS — R3121 Asymptomatic microscopic hematuria: Secondary | ICD-10-CM | POA: Diagnosis not present

## 2022-08-21 DIAGNOSIS — J3089 Other allergic rhinitis: Secondary | ICD-10-CM | POA: Diagnosis not present

## 2022-08-21 DIAGNOSIS — J301 Allergic rhinitis due to pollen: Secondary | ICD-10-CM | POA: Diagnosis not present

## 2022-08-21 DIAGNOSIS — J3081 Allergic rhinitis due to animal (cat) (dog) hair and dander: Secondary | ICD-10-CM | POA: Diagnosis not present

## 2022-08-24 ENCOUNTER — Encounter: Payer: Self-pay | Admitting: Physical Medicine and Rehabilitation

## 2022-08-24 ENCOUNTER — Encounter: Payer: Medicare HMO | Attending: Physical Medicine and Rehabilitation | Admitting: Physical Medicine and Rehabilitation

## 2022-08-24 VITALS — BP 143/89 | HR 87 | Ht 64.0 in | Wt 266.0 lb

## 2022-08-24 DIAGNOSIS — G894 Chronic pain syndrome: Secondary | ICD-10-CM | POA: Diagnosis not present

## 2022-08-24 DIAGNOSIS — M7918 Myalgia, other site: Secondary | ICD-10-CM

## 2022-08-24 DIAGNOSIS — M797 Fibromyalgia: Secondary | ICD-10-CM

## 2022-08-24 DIAGNOSIS — D496 Neoplasm of unspecified behavior of brain: Secondary | ICD-10-CM

## 2022-08-24 MED ORDER — LIDOCAINE HCL 1 % IJ SOLN: 6.0000 mL | Freq: Once | INTRAMUSCULAR | Status: DC

## 2022-08-24 NOTE — Patient Instructions (Signed)
Plan: Patient here for trigger point injections for myofascial pain  Consent done and on chart.  Cleaned areas with alcohol and injected using a 27 gauge 1.5 inch needle  Injected 4cc Using 1% Lidocaine with no EPI  Upper traps B/L  Levators B/L  Posterior scalenes Middle scalenes B/L - with bleeding on L side- which was unexpected Splenius Capitus- B/L- bleeding on L side, which was unepxtected- not on blood thinner Pectoralis Major- B/L  Rhomboids- B/L  Infraspinatus Teres Major/minor Thoracic paraspinals Lumbar paraspinals Other injections- R medial thigh   Patient's level of pain prior was  Current level of pain after injections is pain about the same but thinks due to bleeding  There was no complications. Except some bleeding on L middle scalene and L splenius capitus- does she has hematoma in that area- it's slightly swollen/trace and TTP prior to injections.   Patient was advised to drink a lot of water on day after injections to flush system Will have increased soreness for 12-48 hours after injections.  Can use Lidocaine patches the day AFTER injections Can use theracane on day of injections in places didn't inject Can use heating pad 4-6 hours AFTER injections   2. Wondering if pt has a hematoma that's causing the bleeding/soreness around post auricular area and L splenius capitus around Cerebellar incision from prior crani- is still having dizziness. Will have pt call Surgeon to let them know and if that doesn't work, I can call next week.    3. Will [facility] for dizziness/vertigo s/p Cerebellar crani on L.   4. F/U in 6 weeks- Trp injections and f/u on pain.

## 2022-08-24 NOTE — Progress Notes (Signed)
Pt is a 62 yr old female with hx of fibromyalgia- dx'd at age 46, DM2,- Ac1 6.2;  asthma; HTN, OSA, BMI is 50; Nodule in L superior cerebellum. Also has migraines- intractable and daily; has trace aura;  No kidney issues- Cr 0.65 and BUN 15 in 6/22. Also end stage OA of B/L knees medially and R hip pain. Also has myofascial pain syndrome.  Has new brain mass that was documented Worsening cerebellar Sx's. 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       Did Physical therapy-  For balance- little better-  Stopped it for now- until sees me- they were interested in me re-writing- but to take January off.  Was doing at [facility]- written initially by Dr Juleen China.    Head on L side posterior back of head. was swollen more- PT wanted to have assessment.  Doing better wearing CPAP- if takes off, tries to put back on     Exam: Awake, alert, appropriate, c/o pain around incision on L posterior head incision- has good sized scar; doesn't feel/look real puffy, but hard to assess surrounding tissue to see if swollen- but very TTP Doesn't look like draining- scar well healed   Plan: Patient here for trigger point injections for myofascial pain  Consent done and on chart.  Cleaned areas with alcohol and injected using a 27 gauge 1.5 inch needle  Injected 4cc Using 1% Lidocaine with no EPI  Upper traps B/L  Levators B/L  Posterior scalenes Middle scalenes B/L - with bleeding on L side- which was unexpected Splenius Capitus- B/L- bleeding on L side, which was unepxtected- not on blood thinner Pectoralis Major- B/L  Rhomboids- B/L  Infraspinatus Teres Major/minor Thoracic paraspinals Lumbar paraspinals Other injections- R medial thigh   Patient's level of pain prior was  Current level of pain after injections is pain about the same but thinks due to  bleeding  There was no complications. Except some bleeding on L middle scalene and L splenius capitus- does she has hematoma in that area- it's slightly swollen/trace and TTP prior to injections.   Patient was advised to drink a lot of water on day after injections to flush system Will have increased soreness for 12-48 hours after injections.  Can use Lidocaine patches the day AFTER injections Can use theracane on day of injections in places didn't inject Can use heating pad 4-6 hours AFTER injections   2. Wondering if pt has a hematoma that's causing the bleeding/soreness around post auricular area and L splenius capitus around Cerebellar incision from prior crani- is still having dizziness. Will have pt call Surgeon to let them know and if that doesn't work, I can call next week.    3. Will [facility] for dizziness/vertigo s/p Cerebellar crani on L.   4. F/U in 6 weeks- Trp injections and f/u on pain.   I spent a total of 32   minutes on total care today- >50% coordination of care- due to 10 minutes on trP injections and 22 minutes on f/u-

## 2022-08-30 DIAGNOSIS — J3089 Other allergic rhinitis: Secondary | ICD-10-CM | POA: Diagnosis not present

## 2022-08-30 DIAGNOSIS — J301 Allergic rhinitis due to pollen: Secondary | ICD-10-CM | POA: Diagnosis not present

## 2022-08-30 DIAGNOSIS — J3081 Allergic rhinitis due to animal (cat) (dog) hair and dander: Secondary | ICD-10-CM | POA: Diagnosis not present

## 2022-08-31 ENCOUNTER — Other Ambulatory Visit: Payer: Self-pay | Admitting: Physician Assistant

## 2022-08-31 ENCOUNTER — Other Ambulatory Visit (HOSPITAL_COMMUNITY): Payer: Self-pay

## 2022-08-31 DIAGNOSIS — J301 Allergic rhinitis due to pollen: Secondary | ICD-10-CM

## 2022-09-03 ENCOUNTER — Ambulatory Visit: Payer: Medicare HMO | Attending: Family Medicine | Admitting: Physical Therapy

## 2022-09-03 ENCOUNTER — Encounter: Payer: Self-pay | Admitting: Physical Therapy

## 2022-09-03 ENCOUNTER — Other Ambulatory Visit: Payer: Self-pay

## 2022-09-03 VITALS — BP 144/97 | HR 92

## 2022-09-03 DIAGNOSIS — R2681 Unsteadiness on feet: Secondary | ICD-10-CM | POA: Insufficient documentation

## 2022-09-03 DIAGNOSIS — R278 Other lack of coordination: Secondary | ICD-10-CM | POA: Insufficient documentation

## 2022-09-03 DIAGNOSIS — R2689 Other abnormalities of gait and mobility: Secondary | ICD-10-CM | POA: Insufficient documentation

## 2022-09-03 DIAGNOSIS — Z9181 History of falling: Secondary | ICD-10-CM | POA: Diagnosis not present

## 2022-09-03 DIAGNOSIS — M6281 Muscle weakness (generalized): Secondary | ICD-10-CM | POA: Diagnosis not present

## 2022-09-03 DIAGNOSIS — D496 Neoplasm of unspecified behavior of brain: Secondary | ICD-10-CM | POA: Insufficient documentation

## 2022-09-03 NOTE — Therapy (Signed)
OUTPATIENT PHYSICAL THERAPY NEURO EVALUATION   Patient Name: Virginia Luna MRN: [id number] DOB:12-04-1960, 62 y.o., female Today's Date: 09/03/2022   PCP: Shirline Frees, MD REFERRING PROVIDER: Courtney Heys, MD  END OF SESSION:  PT End of Session - 09/03/22 0856     Visit Number 1    Number of Visits 9    Date for PT Re-Evaluation 11/09/22   pushed out due to scheduling delay   Authorization Type [employer] MEDICARE    Progress Note Due on Visit 10    PT Start Time 0845    PT Stop Time 0932    PT Time Calculation (min) 47 min    Activity Tolerance Patient tolerated treatment well    Behavior During Therapy WFL for tasks assessed/performed             Past Medical History:  Diagnosis Date   Allergies    Anemia    Arthritis    Asthma    Back pain    Chronic pain    Complication of anesthesia    woke up during colonoscopy   Diabetes ([place])    Diabetes mellitus without complication (HCC)    Edema, lower extremity    Fibroid    Fibromyalgia    Chronic   GERD (gastroesophageal reflux disease)    Headache    migraines   High blood pressure    History of hiatal hernia    History of stomach ulcers    IBS (irritable bowel syndrome)    Joint pain    Sleep apnea    did use cpap-lost 25lb-says she does not need it   NO CPAP   Wears contact lenses    Past Surgical History:  Procedure Laterality Date   BRAIN SURGERY Left 11/07/2021   [facility] Dr. Herschel [place]   BREAST BIOPSY     BREAST EXCISIONAL BIOPSY     BREAST LUMPECTOMY WITH RADIOACTIVE SEED LOCALIZATION Bilateral 11/24/2014   Procedure: BILATERAL BREAST LUMPECTOMY WITH RADIOACTIVE SEED LOCALIZATION;  Surgeon: [name], MD;  Location: [place];  Service: General;  Laterality: Bilateral;   CHOLECYSTECTOMY     COLONOSCOPY     DILATION AND CURETTAGE OF UTERUS     ESOPHAGOGASTRODUODENOSCOPY (EGD) WITH PROPOFOL N/A 07/23/2016   Procedure: ESOPHAGOGASTRODUODENOSCOPY (EGD) WITH PROPOFOL;   Surgeon: Laurence Spates, MD;  Location: [facility];  Service: Endoscopy;  Laterality: N/A;   ESOPHAGOGASTRODUODENOSCOPY (EGD) WITH PROPOFOL N/A 06/11/2018   Procedure: ESOPHAGOGASTRODUODENOSCOPY (EGD) WITH PROPOFOL;  Surgeon: Laurence Spates, MD;  Location: [facility];  Service: Endoscopy;  Laterality: N/A;   LUMBAR LAMINECTOMY  2010   ORIF WRIST FRACTURE Right 11/24/2020   Procedure: OPEN REDUCTION INTERNAL FIXATION RIGHT WRIST FRACTURE;  Surgeon: Renette Butters, MD;  Location: [facility];  Service: Orthopedics;  Laterality: Right;   UMBILICAL HERNIA REPAIR     age 53   UPPER GI ENDOSCOPY     Patient Active Problem List   Diagnosis Date Noted   Cerebellar tumor ([place]) 11/15/2021   Comorbid sleep-related hypoventilation 10/19/2021   Urinary incontinence/areflexic bladder 10/05/2021   OSA, unable to tolerate CPAP (vomited every night) 09/05/2021   Chronic pain syndrome 09/05/2021   Chronic nausea 09/05/2021   Neurogenic bladder, followed by Urology, instructed I&O cath BID 09/05/2021   Intolerance of continuous positive airway pressure (CPAP) ventilation 09/03/2021   Insomnia secondary to chronic pain 07/24/2021   Primary osteoarthritis of right knee 07/03/2021   Myofascial pain dysfunction syndrome 07/03/2021   Fibromyalgia 07/03/2021  Hyponatremia 07/03/2021   Primary osteoarthritis of left knee 07/03/2021   Body mass index (BMI) 50.0-59.9, adult ([place]) 03/07/2021   Allergic rhinitis due to animal (cat) (dog) hair and dander 10/06/2020   Allergic rhinitis due to pollen 10/06/2020   Moderate persistent asthma, uncomplicated 77/82/4235   Cerebellar mass 10/06/2020   Paresthesia 05/27/2020   Diabetes mellitus ([place]) 05/25/2020   Hypertension associated with diabetes ([place]) 05/25/2020   Hyperlipidemia associated with type 2 diabetes mellitus ([place]) 05/25/2020   Vitamin D deficiency 05/25/2020   NAFLD (nonalcoholic fatty liver disease) 05/25/2020   Sciatica of left side 05/25/2020     ONSET DATE: 08/24/2022 (referral date-ongoing since March 2023)  REFERRING DIAG: D49.6 (ICD-10-CM) - Cerebellar tumor ([place])  THERAPY DIAG:  Other abnormalities of gait and mobility  Unsteadiness on feet  Muscle weakness (generalized)  Other lack of coordination  History of falling  Rationale for Evaluation and Treatment: Rehabilitation  SUBJECTIVE:                                                                                                                                                                                             SUBJECTIVE STATEMENT: Pt states she is doing more for herself, she has lost weight, and is doing her own laundry using her rollator.   Pt states she continues to have coordination issues since last episode here in clinic.  She states the heat [employer] used on her leg after she fell helped temporarily.  Pt spends time discussing how bad her pain has been the past weeks.  Her right leg, especially the thigh, has been hurting a lot and she feels she needs the pain meds in the morning and night instead of just using Tylenol.  Pt states she is to see Dr. Tomi Likens soon for follow-up on the left posterior head swelling. Pt accompanied by: self  PERTINENT HISTORY: DM2, HTN, left sciatica, fibromyalgia, asthma, and hyperlipidemia  From H&P note on 11/15/2021:  "On the day of admission 11/07/2021, the patient was taken to the operating room by Dr. Recardo Evangelist of neurosurgery and underwent left suboccipital craniotomy, resection of cerebellar tumor. She tolerated the procedure well. Postoperative MRI performed on 3/22 revealed a small area of diffusion restriction in the left cerebellum."  Patient transferred to CIR on 11/15/2021, discharged 11/24/2021 per pt.    Patient completed PT here 4/11-9/1 and a second episode at [facility] for same from November to December 2023.  PAIN:  Are you having pain? Yes: NPRS scale: 8.5/10 Pain location: all over Pain description:  "different types of pain" (gestures to low back as stabbing) Aggravating factors: "I can't  find anything to make it better" Relieving factors: N/A  PRECAUTIONS: Fall  WEIGHT BEARING RESTRICTIONS: No  FALLS: Has patient fallen in last 6 months? Yes. Number of falls 1-"I don't really remember what happened"  She is unsure how long ago this happened.  She fell on the right side, right thigh hurts.  LIVING ENVIRONMENT: Lives with: [facility] - partner Lives in: House/apartment Stairs: No Has following equipment at home: Gilford Rile - 2 wheeled, Environmental consultant - 4 wheeled, Electronics engineer, and Ramped entry  PLOF: Requires assistive device for independence, Needs assistance with ADLs, Needs assistance with homemaking, and Needs assistance with transfers  PATIENT GOALS: "be able to get up better"  OBJECTIVE:  LUE VITALS: Today's Vitals   09/03/22 0851 09/03/22 0857  BP: (!) 151/102 (!) 144/97  Pulse: 85 92   DIAGNOSTIC FINDINGS: No recent relevant imaging to suggest change from prior episode of care.  COGNITION: Overall cognitive status: No family/caregiver present to determine baseline cognitive functioning and pt continues to endorse memory issues   SENSATION: WFL  COORDINATION: LE RAMS:  impaired bilaterally  EDEMA:  Mild bilateral pitting edema 1+  MUSCLE TONE: none noted bilaterally  POSTURE: rounded shoulders, forward head, and posterior pelvic tilt  LOWER EXTREMITY ROM:     Active  Right Eval Left Eval  Hip flexion [facility]  Hip extension    Hip abduction    Hip adduction    Hip internal rotation    Hip external rotation    Knee flexion    Knee extension " "  Ankle dorsiflexion " "  Ankle plantarflexion    Ankle inversion    Ankle eversion     (Blank rows = not tested)  LOWER EXTREMITY MMT:    MMT Right Eval Left Eval  Hip flexion 3/5 3/5  Hip extension    Hip abduction    Hip adduction    Hip internal rotation    Hip external rotation    Knee flexion    Knee  extension 3-/5 3/5  Ankle dorsiflexion Pt has myoclonic type movements when pressing on foot making it hard to formally assess.  Pt >/=3/5  5/5  Ankle plantarflexion    Ankle inversion    Ankle eversion    (Blank rows = not tested)  BED MOBILITY:  Sit to supine Complete Independence Supine to sit Complete Independence Increased time  TRANSFERS: Assistive device utilized: Environmental consultant - 4 wheeled  Sit to stand: Modified independence Stand to sit: Modified independence Chair to chair: Modified independence  GAIT: Gait pattern: step through pattern, trunk flexed, and wide BOS Distance walked: various clinic distances Assistive device utilized: Environmental consultant - 4 wheeled Level of assistance: Modified independence  FUNCTIONAL TESTS:  5 times sit to stand: 18.72 sec w/ BUE support (38.51 sec w/ BUE support from prior eval April 2023) Timed up and go (TUG): 14.22 sec w/ rollator (34.87 sec w/ RW April 2023) 10 meter walk test: 11.81 sec w/ rollator = 0.85 m/sec OR 2.79 ft/sec (43.32 sec = 0.76 ft/sec April 2023) Berg Balance Scale: To be assessed.   Pt had progressed to FGA by time of last discharge from clinic September 2023.  PATIENT SURVEYS:  None completed due to time.  TODAY'S TREATMENT:  DATE: N/A    PATIENT EDUCATION: Education details: PT POC, assessments used and to be used, and goals to be set.  Discussed pt compliance to HEP and maintaining at home as goal of outpatient therapy as pt has had almost 8 months of continuous PT.  BP parameters for therapy and checking BP at home as able as BP was very high today (pt asymptomatic).  Discussed BP related s/s requiring emergent attention. Person educated: Patient Education method: Explanation Education comprehension: verbalized understanding and needs further education  HOME EXERCISE PROGRAM: To be reviewed from  episode prior.  [id number]  GOALS: Goals reviewed with patient? Yes  SHORT TERM GOALS: Target date: 10/05/2022  Pt will be compliant and independent with advanced strength and balance HEP in order to maintain progress and promote carryover to home. Baseline:  Prior HEP to be reviewed. Goal status: INITIAL  2.  Pt will decrease 5xSTS to </=15 seconds w/ BUE support in order to demonstrate decreased risk for falls and improved functional bilateral LE strength and power. Baseline: 18.72 sec w/ BUE support Goal status: INITIAL  3.  Pt will demonstrate TUG of </=13 seconds using LRAD in order to decrease risk of falls and improve functional mobility. Baseline: 14.22 seconds w/ rollator Goal status: INITIAL  4.  Pt will demonstrate a gait speed of >/=3.0 feet/sec using LRAD in order to decrease risk for falls. Baseline: 2.79 ft/sec w/ rollator Goal status: INITIAL  5.  BERG to be assessed w/ STG set as appropriate. Baseline: To be assessed. Goal status: INITIAL  6.  FGA to be assessed at STG goal deadline w/ LTG set as appropriate. Baseline: To be assessed. Goal status: INITIAL  LONG TERM GOALS: Target date: 11/02/2022  Pt to be compliant to formal walking program or alternative aerobic exercise to maintain tolerance and ambulatory balance. Baseline: To be established. Goal status: INITIAL  2.  Pt will decrease 5xSTS to </=13 seconds w/o BUE support in order to demonstrate decreased risk for falls and improved functional bilateral LE strength and power. Baseline: 18.72 seconds w/ BUE support Goal status: INITIAL  3.  Pt will demonstrate a gait speed of >/=3.3 feet/sec using LRAD in order to decrease risk for falls. Baseline: 2.79 ft/sec w/ rollator Goal status: INITIAL  4.  FGA to be assessed w/ LTG set. Baseline: To be assessed. Goal status: INITIAL  ASSESSMENT:  CLINICAL IMPRESSION: Patient is a 62 y.o. female who was seen today for physical therapy evaluation and  treatment for imbalance secondary to cerebellar tumor.  Pt has a significant PMH of DM2, HTN, left sciatica, fibromyalgia, asthma, and hyperlipidemia.  Identified impairments include diffuse pain, impaired memory and recall, high BP, decreased coordination, pitting edema, poor posture, and general LE weakness.  Evaluation via the following assessment tools: history of falls, 5xSTS, TUG, and 10MWT indicate fall risk.  BERG to be assessed at next session in order to establish baseline compared to prior episode.  She would benefit from skilled PT to address impairments as noted and progress towards long term goals.   OBJECTIVE IMPAIRMENTS: Abnormal gait, decreased balance, decreased coordination, decreased endurance, decreased strength, increased edema, improper body mechanics, postural dysfunction, obesity, and pain.   ACTIVITY LIMITATIONS: carrying, lifting, squatting, stairs, transfers, and locomotion level  PARTICIPATION LIMITATIONS: meal prep, cleaning, driving, shopping, and community activity  PERSONAL FACTORS: Age, Fitness, Past/current experiences, Sex, Social background, Time since onset of injury/illness/exacerbation, and 1-2 comorbidities: fibromyalgia and HTN  are also affecting patient's functional outcome.  REHAB POTENTIAL: Good  CLINICAL DECISION MAKING: Stable/uncomplicated  EVALUATION COMPLEXITY: Low  PLAN:  PT FREQUENCY: 1x/week  PT DURATION: 8 weeks  PLANNED INTERVENTIONS: Therapeutic exercises, Therapeutic activity, Neuromuscular re-education, Balance training, Gait training, Patient/Family education, Self Care, Joint mobilization, Stair training, Vestibular training, DME instructions, Manual therapy, and Re-evaluation  PLAN FOR NEXT SESSION: Check L BP!  Assess BERG-set STG, Review/modify prior HEP, general balance/LE strength/endurance  Bary Richard, PT, DPT 09/03/2022, 1:36 PM

## 2022-09-04 DIAGNOSIS — E119 Type 2 diabetes mellitus without complications: Secondary | ICD-10-CM | POA: Diagnosis not present

## 2022-09-04 DIAGNOSIS — E7800 Pure hypercholesterolemia, unspecified: Secondary | ICD-10-CM | POA: Diagnosis not present

## 2022-09-04 DIAGNOSIS — I10 Essential (primary) hypertension: Secondary | ICD-10-CM | POA: Diagnosis not present

## 2022-09-04 DIAGNOSIS — K219 Gastro-esophageal reflux disease without esophagitis: Secondary | ICD-10-CM | POA: Diagnosis not present

## 2022-09-05 ENCOUNTER — Telehealth (INDEPENDENT_AMBULATORY_CARE_PROVIDER_SITE_OTHER): Payer: Medicare HMO | Admitting: Psychiatry

## 2022-09-05 ENCOUNTER — Encounter: Payer: Self-pay | Admitting: Psychiatry

## 2022-09-05 DIAGNOSIS — F5105 Insomnia due to other mental disorder: Secondary | ICD-10-CM

## 2022-09-05 DIAGNOSIS — F419 Anxiety disorder, unspecified: Secondary | ICD-10-CM

## 2022-09-05 DIAGNOSIS — G4733 Obstructive sleep apnea (adult) (pediatric): Secondary | ICD-10-CM | POA: Diagnosis not present

## 2022-09-05 DIAGNOSIS — G8929 Other chronic pain: Secondary | ICD-10-CM | POA: Diagnosis not present

## 2022-09-05 DIAGNOSIS — G894 Chronic pain syndrome: Secondary | ICD-10-CM | POA: Diagnosis not present

## 2022-09-05 DIAGNOSIS — M797 Fibromyalgia: Secondary | ICD-10-CM | POA: Diagnosis not present

## 2022-09-05 DIAGNOSIS — D496 Neoplasm of unspecified behavior of brain: Secondary | ICD-10-CM

## 2022-09-05 DIAGNOSIS — E559 Vitamin D deficiency, unspecified: Secondary | ICD-10-CM

## 2022-09-05 DIAGNOSIS — G3184 Mild cognitive impairment, so stated: Secondary | ICD-10-CM

## 2022-09-05 DIAGNOSIS — G4701 Insomnia due to medical condition: Secondary | ICD-10-CM

## 2022-09-05 DIAGNOSIS — F450 Somatization disorder: Secondary | ICD-10-CM | POA: Diagnosis not present

## 2022-09-05 MED ORDER — ZOLPIDEM TARTRATE 10 MG PO TABS: 10.0000 mg | ORAL_TABLET | Freq: Every day | ORAL | 5 refills | Status: DC

## 2022-09-05 MED ORDER — QUETIAPINE FUMARATE 300 MG PO TABS: 300.0000 mg | ORAL_TABLET | Freq: Every day | ORAL | 3 refills | Status: DC

## 2022-09-05 NOTE — Progress Notes (Signed)
Virginia Luna [id number] [birth year] 62 y.o.  Virtual Visit via Telephone Note  I connected with pt by telephone and verified that I am speaking with the correct person using two identifiers.   I discussed the limitations, risks, security and privacy concerns of performing an evaluation and management service by telephone and the availability of in person appointments. I also discussed with the patient that there may be a patient responsible charge related to this service. The patient expressed understanding and agreed to proceed.  I discussed the assessment and treatment plan with the patient. The patient was provided an opportunity to ask questions and all were answered. The patient agreed with the plan and demonstrated an understanding of the instructions.   The patient was advised to call back or seek an in-person evaluation if the symptoms worsen or if the condition fails to improve as anticipated.  I provided 30 minutes of non-face-to-face time during this encounter. The patient was located at home and the provider was located office. Session from 1145 to 12:15 PM   Subjective:   Patient ID:  Virginia Luna is a 62 y.o. (DOB 01/19/1961) female.  Chief Complaint:  Chief Complaint  Patient presents with   Follow-up   Anxiety   Sleeping Problem    HPI Jorge A Parco presents for follow-up of chronic insomnia.  seen May 2020 & March 2021.  No meds were changed  07/20/20 appt with following noted: No Covid. Vaccinated.  Pain in back with injections and problems with migraine.  Maintaining OK.  Isolating and wearing mask.  Outside church when possible.  Not depressed.    Sleep affected by pain off and on. Sleep is OK unless pain interferes.  Some good sleep is better than none.  If can not stay asleep then will get up and work a puzzle and then go back to sleep.    Awakens in pain from FM and related to fall. Trying to keep up with exercises.   Pain is still high more often  than it should be.  Seeing Dr. Juleen China about weight loss.  No new meds.  Just finished PT from the last sig fall.    08/07/2021 appt noted: "Brain tumor"  have blacked out and had tremors.  Referred to in My Chart as a nodule.  Has had some falls and been worked up without dx determinative.  Still seeing Dr. Juleen China regularly and that is helpful.  Has been tearful but not depressed.   Remains on quetiapine 300 mg HS and Ambien 10 mg HS.  They help sleep and needs the combination to sleep. Plan: no changes  09/05/22 appt noted: Continues quetiapine 300 mg HS and Ambien 10 mg HS. Chronic issues with memory.  Seeing neuro for migraine shots, Dr. Tomi Likens.  Cerebellar tumor resection March 2023.  Motor skills not as good and needs walker.  Has had some falls and in PT.  Was crying a lot of difference in her from surgery but not as much now.  Not really good with change and doesn't want med changes. Made some notes for today.   Lost some weight with Dr. Juleen China.    Using CPAP usally but sometimes throws it off.  Meds she takes helps her sleep.   Sleep routine is same with some awakeing and gets out of bed if can't sleep and watch TV and then back to sleep.  Patient reports stable mood and denies depressed or irritable moods.   Patient denies difficulty with  sleep initiation but does with maintenance. Denies appetite disturbance.  Patient reports that energy and motivation have been good.    Patient denies any suicidal ideation.   Wears mask pretty much all the time even at home.   Past Psychiatric Medication Trials: Doxepin, Rozerem,  Amitriptyline, mirtazapine, Ambien, quetiapine olanzapine Lyrica , Trileptal, duloxetine gabapentin, cyclobenzaprine  Review of Systems:  Review of Systems  HENT:  Negative for postnasal drip, rhinorrhea and sneezing.   Musculoskeletal:  Positive for back pain, myalgias and neck pain.  Neurological:  Positive for weakness. Negative for tremors.       Frequent falls   Psychiatric/Behavioral:  Positive for sleep disturbance.        Memory   Medications: I have reviewed the patient's current medications.  Current Outpatient Medications  Medication Sig Dispense Refill   acetaminophen (TYLENOL) 325 MG tablet Take 1-2 tablets (325-650 mg total) by mouth every 4 (four) hours as needed for mild pain.     amLODipine (NORVASC) 5 MG tablet Take 1 tablet (5 mg total) by mouth daily. (Patient taking differently: Take 10 mg by mouth daily.) 30 tablet 0   atorvastatin (LIPITOR) 10 MG tablet Take 1 tablet (10 mg total) by mouth at bedtime. 30 tablet 0   azelastine (ASTELIN) 0.1 % nasal spray Place 1 spray into both nostrils 2 (two) times daily as needed for rhinitis. 30 mL 0   BREO ELLIPTA 200-25 MCG/ACT AEPB Inhale 1 puff into the lungs daily.     cyclobenzaprine (FLEXERIL) 10 MG tablet Take 20 mg by mouth at bedtime.     diclofenac (VOLTAREN) 75 MG EC tablet Take 75 mg by mouth 2 (two) times daily.     EPINEPHrine 0.3 mg/0.3 mL IJ SOAJ injection Inject 0.3 mg into the muscle as needed for anaphylaxis.     fluticasone (FLONASE) 50 MCG/ACT nasal spray Place 1 spray into both nostrils 2 (two) times daily as needed for allergies.     furosemide (LASIX) 20 MG tablet Take 20 mg by mouth daily as needed for fluid.     gabapentin (NEURONTIN) 300 MG capsule Take 300 mg by mouth 2 (two) times daily.     HYDROcodone-acetaminophen (NORCO/VICODIN) 5-325 MG tablet Take 2 tablets by mouth at bedtime.     levocetirizine (XYZAL) 5 MG tablet Take 1 tablet (5 mg total) by mouth every evening. 30 tablet 0   lidocaine-prilocaine (EMLA) cream Apply 1 Application topically as needed. 30 g 0   linaclotide (LINZESS) 72 MCG capsule Take 1 capsule (72 mcg total) by mouth daily as needed (constipation). 90 capsule 0   meclizine (ANTIVERT) 12.5 MG tablet Take 12.5 mg by mouth 3 (three) times daily as needed.     melatonin 5 MG TABS Take 1 tablet (5 mg total) by mouth at bedtime. 30 tablet 0    methocarbamol (ROBAXIN) 750 MG tablet Take 1 tablet (750 mg total) by mouth every 8 (eight) hours as needed for muscle spasms. (Patient taking differently: Take 750 mg by mouth 3 (three) times daily.) 60 tablet 0   montelukast (SINGULAIR) 10 MG tablet Take 1 tablet (10 mg total) by mouth at bedtime. 30 tablet 0   NON FORMULARY CPAP at bedtime     ondansetron (ZOFRAN ODT) 4 MG disintegrating tablet Take 1 tablet (4 mg total) by mouth every 6 (six) hours as needed for nausea or vomiting. 20 tablet 0   Oxcarbazepine (TRILEPTAL) 300 MG tablet Take 3 tablets (900 mg total) by mouth at bedtime.  For nerve pain 270 tablet 1   pantoprazole (PROTONIX) 40 MG tablet Take 1 tablet (40 mg total) by mouth daily. 30 tablet 0   polyethylene glycol (MIRALAX / GLYCOLAX) 17 g packet Take 17 g by mouth daily as needed for mild constipation. 14 each 0   QUEtiapine (SEROQUEL) 300 MG tablet Take 1 tablet (300 mg total) by mouth at bedtime. 90 tablet 0   Semaglutide,0.25 or 0.'5MG'$ /DOS, (OZEMPIC, 0.25 OR 0.5 MG/DOSE,) 2 MG/1.5ML SOPN Inject 0.5 mg into the skin once a week. (Patient taking differently: Inject 0.5 mg into the skin every Sunday.) 1.5 mL 0   senna-docusate (SENOKOT-S) 8.6-50 MG tablet Take 2 tablets by mouth at bedtime. 60 tablet 0   Tetrahydrozoline HCl (VISINE OP) Place 1 drop into both eyes 2 (two) times daily as needed (dry eyes).     Vitamin D, Ergocalciferol, (DRISDOL) 1.25 MG (50000 UNIT) CAPS capsule TAKE ONE CAPSULE BY MOUTH EVERY 7 DAYS (Patient taking differently: Take 50,000 Units by mouth every Sunday.) 4 capsule 0   zolpidem (AMBIEN) 10 MG tablet Take 1 tablet (10 mg total) by mouth at bedtime. 30 tablet 5   zonisamide (ZONEGRAN) 100 MG capsule Take 2 capsules (200 mg total) by mouth daily. 30 capsule 5   albuterol (PROVENTIL) (2.5 MG/3ML) 0.083% nebulizer solution Take 3 mLs (2.5 mg total) by nebulization every 6 (six) hours as needed for up to 14 days for wheezing or shortness of breath. 75 mL 0    meclizine (ANTIVERT) 25 MG tablet Take 1 tablet (25 mg total) by mouth 3 (three) times daily as needed for dizziness. (Patient not taking: Reported on 09/05/2022) 60 tablet 0   ondansetron (ZOFRAN-ODT) 4 MG disintegrating tablet TAKE ONE TABLET BY MOUTH EVERY 8 HOURS AS NEEDED FOR NAUSEA AND VOMITING (Patient not taking: Reported on 09/05/2022) 90 tablet 5   Current Facility-Administered Medications  Medication Dose Route Frequency Provider Last Rate Last Admin   lidocaine (XYLOCAINE) 1 % (with pres) injection 6 mL  6 mL Other Once Lovorn, Megan, MD       lidocaine (XYLOCAINE) 1 % (with pres) injection 6 mL  6 mL Other Once Lovorn, Jinny Blossom, MD        Medication Side Effects: None  Allergies:  Allergies  Allergen Reactions   Rocephin [Ceftriaxone] Anaphylaxis   Morphine And Related Itching and Nausea And Vomiting    Doesn't work   Prednisone Itching and Swelling   Sulfa Antibiotics Itching and Swelling   Amitriptyline Other (See Comments)   Penicillin G Sodium Other (See Comments)    Past Medical History:  Diagnosis Date   Allergies    Anemia    Arthritis    Asthma    Back pain    Chronic pain    Complication of anesthesia    woke up during colonoscopy   Diabetes ([place])    Diabetes mellitus without complication (HCC)    Edema, lower extremity    Fibroid    Fibromyalgia    Chronic   GERD (gastroesophageal reflux disease)    Headache    migraines   High blood pressure    History of hiatal hernia    History of stomach ulcers    IBS (irritable bowel syndrome)    Joint pain    Sleep apnea    did use cpap-lost 25lb-says she does not need it   NO CPAP   Wears contact lenses     Family History  Problem Relation Age of Onset  Obesity Mother    Diabetes Father    High blood pressure Father    Sudden death Father    Breast cancer Paternal Grandmother    Breast cancer Paternal Aunt     Social History   Socioeconomic History   Marital status: Single    Spouse name: Not  on file   Number of children: Not on file   Years of education: Not on file   Highest education level: Some college, no degree  Occupational History   Occupation: disabled    Comment: Psychologist, occupational at daycare  Tobacco Use   Smoking status: Never   Smokeless tobacco: Never  Vaping Use   Vaping Use: Never used  Substance and Sexual Activity   Alcohol use: No   Drug use: No   Sexual activity: Not Currently  Other Topics Concern   Not on file  Social History Narrative   Lives w roommate   Right handed   Caffeine: 2 cups of tea a week. 2 sodas a week   Social Determinants of Radio broadcast assistant Strain: Not on file  Food Insecurity: Not on file  Transportation Needs: Not on file  Physical Activity: Not on file  Stress: Not on file  Social Connections: Not on file  Intimate Partner Violence: Not on file    Past Medical History, Surgical history, Social history, and Family history were reviewed and updated as appropriate.   Please see review of systems for further details on the patient's review from today.   Objective:   Physical Exam:  LMP  (LMP Unknown)   Physical Exam Neurological:     Mental Status: She is alert and oriented to person, place, and time.     Cranial Nerves: No dysarthria.  Psychiatric:        Attention and Perception: Attention normal. She does not perceive auditory hallucinations.        Mood and Affect: Mood is anxious. Mood is not depressed. Affect is not tearful.        Speech: Speech normal.        Behavior: Behavior is cooperative.        Thought Content: Thought content normal. Thought content is not paranoid or delusional. Thought content does not include homicidal or suicidal ideation. Thought content does not include suicidal plan.        Cognition and Memory: Cognition normal. She exhibits impaired recent memory.        Judgment: Judgment normal.     Comments: Chronic somatic focus ongoing. Insight fair. Talkative per usual.      Lab Review:     Component Value Date/Time   NA 143 02/07/2022 1438   NA 143 09/05/2021 1523   K 3.5 02/07/2022 1438   CL 109 02/07/2022 1438   CO2 24 02/07/2022 1438   GLUCOSE 111 (H) 02/07/2022 1438   BUN 14 02/07/2022 1438   BUN 15 09/05/2021 1523   CREATININE 0.53 02/07/2022 1438   CALCIUM 9.6 02/07/2022 1438   PROT 7.4 02/07/2022 1438   PROT 7.3 09/05/2021 1523   ALBUMIN 4.3 02/07/2022 1438   ALBUMIN 4.6 09/05/2021 1523   AST 10 (L) 02/07/2022 1438   ALT 15 02/07/2022 1438   ALKPHOS 140 (H) 02/07/2022 1438   BILITOT 0.4 02/07/2022 1438   BILITOT 0.2 09/05/2021 1523   GFRNONAA >60 02/07/2022 1438   GFRAA 116 10/12/2020 1624       Component Value Date/Time   WBC 11.9 (H) 02/07/2022 1438  RBC 4.46 02/07/2022 1438   HGB 11.6 (L) 02/07/2022 1438   HGB 11.6 09/05/2021 1523   HCT 38.0 02/07/2022 1438   HCT 36.0 09/05/2021 1523   PLT 537 (H) 02/07/2022 1438   PLT 501 (H) 09/05/2021 1523   MCV 85.2 02/07/2022 1438   MCV 84 09/05/2021 1523   MCH 26.0 02/07/2022 1438   MCHC 30.5 02/07/2022 1438   RDW 17.8 (H) 02/07/2022 1438   RDW 16.0 (H) 09/05/2021 1523   LYMPHSABS 2.3 11/24/2021 0511   LYMPHSABS 2.2 09/05/2021 1523   MONOABS 0.6 11/24/2021 0511   EOSABS 0.2 11/24/2021 0511   EOSABS 0.1 09/05/2021 1523   BASOSABS 0.1 11/24/2021 0511   BASOSABS 0.0 09/05/2021 1523    No results found for: "POCLITH", "LITHIUM"   No results found for: "PHENYTOIN", "PHENOBARB", "VALPROATE", "CBMZ"   .res Assessment: Plan:    Anxiety with somatization  Insomnia due to mental condition  Vitamin D deficiency  Cerebellar tumor (HCC)  OSA, unable to tolerate CPAP (vomited every night)  Insomnia secondary to chronic pain  Fibromyalgia  Chronic pain syndrome  Mild cognitive impairment   Greater than 50% of 30 min video face to face time with patient was spent on counseling and coordination of care. We discussed Chronic somatization and anxiety and insomnia ongoing  but helped by meds markedly..  Now worsening MCI bc cerebellar tumor resection.  Discussed the possibility of splitting the zolpidem between sleep onset and then with the early morning awakening.  She says she is tried that and does better about taking it all at once first thing going to bed.  We discussed the risk of amnesia.  She is not had any problems with that.  Discussed potential metabolic side effects associated with atypical antipsychotics, as well as potential risk for movement side effects. Advised pt to contact office if movement side effects occur.  She has required quetiapine for chronic severe insomnia that is related to both anxiety and to fibromyalgia.  She gets clear benefit from quetiapine.  She is at the lowest effective dose.  We have tried to reduce dosages.  She is tolerating it well.  Continue quetiapine 300 mg HS and Ambien 10 mg HS.  Cannot sleep without these meds.  Quetiapine likely having positive mood and anxiety affect also.  Med dosing has been stable for follow-up of several years Improved sleep and moods with the meds.  Supportive therapy on sleepy hygiene including restriction techniques and consistency etc.  Supportive therapy dealing with consequences of brain surgery.  Follow-up 12 months because of stability.  She has been on the same meds at the same dosage for several years.  Schedule next appt in office if can get here.  DT mobility problems couldn't get her today DT recent falling.  Lynder Parents, MD, DFAPA   Please see After Visit Summary for patient specific instructions.  Future Appointments  Date Time Provider [place]  09/07/2022 11:10 AM Pieter Partridge, DO [facility] None  09/10/2022  9:30 AM Bary Richard, PT [facility]  09/17/2022 10:15 AM Bary Richard, PT [facility]  09/24/2022  9:30 AM Bary Richard, PT [facility]  10/01/2022 11:45 AM Bary Richard, PT [facility]  10/08/2022 11:00 AM Bary Richard, PT [facility]  10/15/2022 11:00 AM Bary Richard, PT [facility]  10/22/2022 11:00 AM Bary Richard, PT [facility]  10/29/2022 11:00 AM Bary Richard, PT [facility]  11/01/2022 10:30 AM Lomax,  Amy, NP [facility] None  11/02/2022 11:00 AM Lovorn, Jinny Blossom, MD [facility] CPR  12/03/2022 10:00 AM Courtney Heys, MD [facility] CPR  12/07/2022  2:10 PM Pieter Partridge, DO [facility] None  01/09/2023  9:30 AM Pieter Partridge, DO [facility] None  05/06/2023  9:30 AM Dohmeier, Asencion Partridge, MD [facility] None    No orders of the defined types were placed in this encounter.     -------------------------------

## 2022-09-07 ENCOUNTER — Ambulatory Visit: Payer: Medicare HMO | Admitting: Neurology

## 2022-09-07 DIAGNOSIS — G43709 Chronic migraine without aura, not intractable, without status migrainosus: Secondary | ICD-10-CM

## 2022-09-07 MED ORDER — ONABOTULINUMTOXINA 100 UNITS IJ SOLR
200.0000 [IU] | Freq: Once | INTRAMUSCULAR | Status: AC
  Administered 2022-09-07: 155 [IU] via INTRAMUSCULAR

## 2022-09-07 NOTE — Progress Notes (Signed)
[facility]  ° °Procedure Note Botox ° °Attending: Dr. Shakiyah Cirilo ° °Preoperative Diagnosis(es): Chronic migraine ° °Consent obtained from: The patient °Benefits discussed included, but were not limited to decreased muscle tightness, increased joint range of motion, and decreased pain.  Risk discussed included, but were not limited pain and discomfort, bleeding, bruising, excessive weakness, venous thrombosis, muscle atrophy and dysphagia.  Anticipated outcomes of the procedure as well as he risks and benefits of the alternatives to the procedure, and the roles and tasks of the personnel to be involved, were discussed with the patient, and the patient consents to the procedure and agrees to proceed. A copy of the patient medication guide was given to the patient which explains the blackbox warning. ° °Patients identity and treatment sites confirmed Yes.  . ° °Details of Procedure: °Skin was cleaned with alcohol. Prior to injection, the needle plunger was aspirated to make sure the needle was not within a blood vessel.  There was no blood retrieved on aspiration.   ° °Following is a summary of the muscles injected  And the amount of Botulinum toxin used: ° °Dilution °200 units of Botox was reconstituted with 4 ml of preservative free normal saline. °Time of reconstitution: At the time of the office visit (<30 minutes prior to injection)  ° °Injections  °155 total units of Botox was injected with a 30 gauge needle. ° °Injection Sites: °L occipitalis: 15 units- 3 sites  °R occiptalis: 15 units- 3 sites ° °L upper trapezius: 15 units- 3 sites °R upper trapezius: 15 units- 3 sits          °L paraspinal: 10 units- 2 sites °R paraspinal: 10 units- 2 sites ° °Face °L frontalis(2 injection sites):10 units   °R frontalis(2 injection sites):10 units         °L corrugator: 5 units   °R corrugator: 5 units           °Procerus: 5 units   °L temporalis: 20 units °R temporalis: 20 units  ° °Agent:  °200 units of botulinum Type  A (Onobotulinum Toxin type A) was reconstituted with 4 ml of preservative free normal saline.  °Time of reconstitution: At the time of the office visit (<30 minutes prior to injection)  ° ° ° Total injected (Units):  155 ° Total wasted (Units):  45 ° °Patient tolerated procedure well without complications.   °Reinjection is anticipated in 3 months. ° ° °

## 2022-09-10 ENCOUNTER — Encounter: Payer: Self-pay | Admitting: Physical Therapy

## 2022-09-10 ENCOUNTER — Ambulatory Visit: Payer: Medicare HMO | Admitting: Physical Therapy

## 2022-09-10 VITALS — BP 155/85 | HR 85

## 2022-09-10 DIAGNOSIS — R2681 Unsteadiness on feet: Secondary | ICD-10-CM | POA: Diagnosis not present

## 2022-09-10 DIAGNOSIS — R2689 Other abnormalities of gait and mobility: Secondary | ICD-10-CM | POA: Diagnosis not present

## 2022-09-10 DIAGNOSIS — R278 Other lack of coordination: Secondary | ICD-10-CM | POA: Diagnosis not present

## 2022-09-10 DIAGNOSIS — M6281 Muscle weakness (generalized): Secondary | ICD-10-CM | POA: Diagnosis not present

## 2022-09-10 DIAGNOSIS — Z9181 History of falling: Secondary | ICD-10-CM | POA: Diagnosis not present

## 2022-09-10 DIAGNOSIS — D496 Neoplasm of unspecified behavior of brain: Secondary | ICD-10-CM | POA: Diagnosis not present

## 2022-09-10 NOTE — Patient Instructions (Signed)
Access Code: [email or web address] URL: [email or web address] Date: 09/10/2022 Prepared by: Elease Etienne  Exercises - Sit to Stand Without Arm Support  - 1 x daily - 7 x weekly - 3 sets - 10 reps - Standing March with Counter Support  - 1 x daily - 7 x weekly - 3 sets - 10 reps - Chin Tuck  - 1 x daily - 7 x weekly - 3 sets - 10 reps - Seated Cervical Sidebending Stretch  - 1 x daily - 7 x weekly - 3 sets - 10 reps - Supine Bridge  - 1 x daily - 4 x weekly - 3 sets - 10 reps - Supine Active Straight Leg Raise  - 1 x daily - 4 x weekly - 2 sets - 8 reps - Forward and Backward Monster Walk with Counter Support  - 1 x daily - 4 x weekly - 3 sets - 10 reps - Seated Heel Raise  - 1 x daily - 4 x weekly - 2 sets - 12 reps - Single Leg Stance on Pillow  - 1 x daily - 4 x weekly - 1 sets - 4 reps - Heel Raises with Counter Support  - 1 x daily - 4 x weekly - 2 sets - 8 reps - Sidelying ITB Stretch off Table  - 1 x daily - 4 x weekly - 1 sets - 2 reps - 45 seconds hold

## 2022-09-10 NOTE — Therapy (Signed)
OUTPATIENT PHYSICAL THERAPY NEURO TREATMENT   Patient Name: Virginia Luna MRN: [id number] DOB:26-Jun-1961, 62 y.o., female Today's Date: 09/10/2022   PCP: Shirline Frees, MD REFERRING PROVIDER: Courtney Heys, MD  END OF SESSION:  PT End of Session - 09/10/22 0944     Visit Number 2    Number of Visits 9    Date for PT Re-Evaluation 11/09/22   pushed out due to scheduling delay   Authorization Type [employer] MEDICARE    Authorization Time Period 09/03/22-11/09/22    Progress Note Due on Visit 10    PT Start Time 0935    PT Stop Time 1015    PT Time Calculation (min) 40 min    Equipment Utilized During Treatment Gait belt    Activity Tolerance Patient tolerated treatment well    Behavior During Therapy WFL for tasks assessed/performed             Past Medical History:  Diagnosis Date   Allergies    Anemia    Arthritis    Asthma    Back pain    Chronic pain    Complication of anesthesia    woke up during colonoscopy   Diabetes ([place])    Diabetes mellitus without complication (HCC)    Edema, lower extremity    Fibroid    Fibromyalgia    Chronic   GERD (gastroesophageal reflux disease)    Headache    migraines   High blood pressure    History of hiatal hernia    History of stomach ulcers    IBS (irritable bowel syndrome)    Joint pain    Sleep apnea    did use cpap-lost 25lb-says she does not need it   NO CPAP   Wears contact lenses    Past Surgical History:  Procedure Laterality Date   BRAIN SURGERY Left 11/07/2021   [facility] Dr. Herschel [place]   BREAST BIOPSY     BREAST EXCISIONAL BIOPSY     BREAST LUMPECTOMY WITH RADIOACTIVE SEED LOCALIZATION Bilateral 11/24/2014   Procedure: BILATERAL BREAST LUMPECTOMY WITH RADIOACTIVE SEED LOCALIZATION;  Surgeon: [name], MD;  Location: [facility];  Service: General;  Laterality: Bilateral;   CHOLECYSTECTOMY     COLONOSCOPY     DILATION AND CURETTAGE OF UTERUS     ESOPHAGOGASTRODUODENOSCOPY (EGD)  WITH PROPOFOL N/A 07/23/2016   Procedure: ESOPHAGOGASTRODUODENOSCOPY (EGD) WITH PROPOFOL;  Surgeon: Laurence Spates, MD;  Location: [facility];  Service: Endoscopy;  Laterality: N/A;   ESOPHAGOGASTRODUODENOSCOPY (EGD) WITH PROPOFOL N/A 06/11/2018   Procedure: ESOPHAGOGASTRODUODENOSCOPY (EGD) WITH PROPOFOL;  Surgeon: Laurence Spates, MD;  Location: [facility];  Service: Endoscopy;  Laterality: N/A;   LUMBAR LAMINECTOMY  2010   ORIF WRIST FRACTURE Right 11/24/2020   Procedure: OPEN REDUCTION INTERNAL FIXATION RIGHT WRIST FRACTURE;  Surgeon: Renette Butters, MD;  Location: [facility];  Service: Orthopedics;  Laterality: Right;   UMBILICAL HERNIA REPAIR     age 73   UPPER GI ENDOSCOPY     Patient Active Problem List   Diagnosis Date Noted   Cerebellar tumor ([place]) 11/15/2021   Comorbid sleep-related hypoventilation 10/19/2021   Urinary incontinence/areflexic bladder 10/05/2021   OSA, unable to tolerate CPAP (vomited every night) 09/05/2021   Chronic pain syndrome 09/05/2021   Chronic nausea 09/05/2021   Neurogenic bladder, followed by Urology, instructed I&O cath BID 09/05/2021   Intolerance of continuous positive airway pressure (CPAP) ventilation 09/03/2021   Insomnia secondary to chronic pain 07/24/2021   Primary osteoarthritis of  right knee 07/03/2021   Myofascial pain dysfunction syndrome 07/03/2021   Fibromyalgia 07/03/2021   Hyponatremia 07/03/2021   Primary osteoarthritis of left knee 07/03/2021   Body mass index (BMI) 50.0-59.9, adult ([place]) 03/07/2021   Allergic rhinitis due to animal (cat) (dog) hair and dander 10/06/2020   Allergic rhinitis due to pollen 10/06/2020   Moderate persistent asthma, uncomplicated 66/29/4765   Cerebellar mass 10/06/2020   Paresthesia 05/27/2020   Diabetes mellitus ([place]) 05/25/2020   Hypertension associated with diabetes ([place]) 05/25/2020   Hyperlipidemia associated with type 2 diabetes mellitus ([place]) 05/25/2020   Vitamin D deficiency 05/25/2020    NAFLD (nonalcoholic fatty liver disease) 05/25/2020   Sciatica of left side 05/25/2020    ONSET DATE: 08/24/2022 (referral date-ongoing since March 2023)  REFERRING DIAG: D49.6 (ICD-10-CM) - Cerebellar tumor ([place])  THERAPY DIAG:  Other abnormalities of gait and mobility  Unsteadiness on feet  Muscle weakness (generalized)  Other lack of coordination  History of falling  Rationale for Evaluation and Treatment: Rehabilitation  SUBJECTIVE:                                                                                                                                                                                             SUBJECTIVE STATEMENT: Pt states she prevented a fall, she has been working on brain puzzles.  She expresses interest in going to the gym w/ personal trainer, but wants someone who understands her predicament.  Pt states she did not necessarily want to do PT again, but is glad she is here.  She has been following up with psychiatry and states she had Botox for headaches and her head was very tender following this, but she can touch the scar today which is improvement.  She denies falls since evaluation. Pt accompanied by: self  PERTINENT HISTORY: DM2, HTN, left sciatica, fibromyalgia, asthma, and hyperlipidemia  From H&P note on 11/15/2021:  "On the day of admission 11/07/2021, the patient was taken to the operating room by Dr. Recardo Evangelist of neurosurgery and underwent left suboccipital craniotomy, resection of cerebellar tumor. She tolerated the procedure well. Postoperative MRI performed on 3/22 revealed a small area of diffusion restriction in the left cerebellum."  Patient transferred to CIR on 11/15/2021, discharged 11/24/2021 per pt.    Patient completed PT here 4/11-9/1 and a second episode at [facility] for same from November to December 2023.  PAIN:  Are you having pain? Yes: NPRS scale: 9/10 Pain location: all over Pain description: "different types of pain"  (gestures to low back as stabbing) Aggravating factors: "I can't find anything to make it better" Relieving factors: N/A  PRECAUTIONS: Fall  WEIGHT BEARING RESTRICTIONS: No  FALLS: Has patient fallen in last 6 months? Yes. Number of falls 1-"I don't really remember what happened"  She is unsure how long ago this happened.  She fell on the right side, right thigh hurts.  OBJECTIVE:  LUE VITALS: Today's Vitals   09/10/22 0938  BP: (!) 155/85  Pulse: 85   TODAY'S TREATMENT:                                                                                                                              DATE:  -Assessed BERG:  [facility] PT Assessment - 09/10/22 0946       Standardized Balance Assessment   Standardized Balance Assessment Berg Balance Test      Berg Balance Test   Sit to Stand Able to stand  independently using hands    Standing Unsupported Able to stand 2 minutes with supervision    Sitting with Back Unsupported but Feet Supported on Floor or Stool Able to sit safely and securely 2 minutes    Stand to Sit Controls descent by using hands    Transfers Able to transfer safely, definite need of hands    Standing Unsupported with Eyes Closed Able to stand 10 seconds safely    Standing Unsupported with Feet Together Able to place feet together independently and stand for 1 minute with supervision    From Standing, Reach Forward with Outstretched Arm Can reach forward >5 cm safely (2")    From Standing Position, Pick up Object from Floor Able to pick up shoe, needs supervision    From Standing Position, Turn to Look Behind Over each Shoulder Turn sideways only but maintains balance    Turn 360 Degrees Able to turn 360 degrees safely but slowly    Standing Unsupported, Alternately Place Feet on Step/Stool Able to complete 4 steps without aid or supervision    Standing Unsupported, One Foot in Front Able to take small step independently and hold 30 seconds    Standing on One Leg Tries  to lift leg/unable to hold 3 seconds but remains standing independently    Total Score 37            -STS no UE support x8 -Standing march at counter x20 -Seated chin tucks 10x2sec hold -Seated cervical side-bending stretch x45 seconds each side -Seated heel raise x20, emphasis on quick up and slow lower  PATIENT EDUCATION: Education details: Discussed [facility], provided flyer.  BERG outcome.  Continuing prior HEP-work on exercises reviewed this session. Person educated: Patient Education method: Explanation Education comprehension: verbalized understanding and needs further education  HOME EXERCISE PROGRAM:  Access Code: [email or web address] URL: [email or web address] Date: 09/10/2022 Prepared by: Elease Etienne  Exercises - Sit to Stand Without Arm Support  - 1 x daily - 7 x weekly - 3 sets - 10 reps - Standing March with Counter Support  - 1 x daily - 7 x weekly -  3 sets - 10 reps - Chin Tuck  - 1 x daily - 7 x weekly - 3 sets - 10 reps - Seated Cervical Sidebending Stretch  - 1 x daily - 7 x weekly - 3 sets - 10 reps - Supine Bridge  - 1 x daily - 4 x weekly - 3 sets - 10 reps - Supine Active Straight Leg Raise  - 1 x daily - 4 x weekly - 2 sets - 8 reps - Forward and Backward Monster Walk with Counter Support  - 1 x daily - 4 x weekly - 3 sets - 10 reps - Seated Heel Raise  - 1 x daily - 4 x weekly - 2 sets - 12 reps - Single Leg Stance on Pillow  - 1 x daily - 4 x weekly - 1 sets - 4 reps - Heel Raises with Counter Support  - 1 x daily - 4 x weekly - 2 sets - 8 reps - Sidelying ITB Stretch off Table  - 1 x daily - 4 x weekly - 1 sets - 2 reps - 45 seconds hold  GOALS: Goals reviewed with patient? Yes  SHORT TERM GOALS: Target date: 10/05/2022  Pt will be compliant and independent with advanced strength and balance HEP in order to maintain progress and promote carryover to home. Baseline:  Prior HEP to be reviewed. Goal status: INITIAL  2.  Pt will decrease  5xSTS to </=15 seconds w/ BUE support in order to demonstrate decreased risk for falls and improved functional bilateral LE strength and power. Baseline: 18.72 sec w/ BUE support Goal status: INITIAL  3.  Pt will demonstrate TUG of </=13 seconds using LRAD in order to decrease risk of falls and improve functional mobility. Baseline: 14.22 seconds w/ rollator Goal status: INITIAL  4.  Pt will demonstrate a gait speed of >/=3.0 feet/sec using LRAD in order to decrease risk for falls. Baseline: 2.79 ft/sec w/ rollator Goal status: INITIAL  5.  Pt will increase BERG balance score to >/=41/56 to demonstrate improved static balance. Baseline: 37/56 Goal status: INITIAL  6.  FGA to be assessed at STG goal deadline w/ LTG set as appropriate. Baseline: To be assessed. Goal status: INITIAL  LONG TERM GOALS: Target date: 11/02/2022  Pt to be compliant to formal walking program or alternative aerobic exercise to maintain tolerance and ambulatory balance. Baseline: To be established. Goal status: INITIAL  2.  Pt will decrease 5xSTS to </=13 seconds w/o BUE support in order to demonstrate decreased risk for falls and improved functional bilateral LE strength and power. Baseline: 18.72 seconds w/ BUE support Goal status: INITIAL  3.  Pt will demonstrate a gait speed of >/=3.3 feet/sec using LRAD in order to decrease risk for falls. Baseline: 2.79 ft/sec w/ rollator Goal status: INITIAL  4.  FGA to be assessed w/ LTG set. Baseline: To be assessed. Goal status: INITIAL  ASSESSMENT:  CLINICAL IMPRESSION: Assessed BERG and set goal this visit.  Pt scores 37/56 this visit compared to 48/56 at last assessment during prior episode of care in this clinic.  Began reviewing HEP for pt cervical LE strength.  Will finish reviewing existing HEP and modify as needed to meet patient's current functional level.   OBJECTIVE IMPAIRMENTS: Abnormal gait, decreased balance, decreased coordination, decreased  endurance, decreased strength, increased edema, improper body mechanics, postural dysfunction, obesity, and pain.   ACTIVITY LIMITATIONS: carrying, lifting, squatting, stairs, transfers, and locomotion level  PARTICIPATION LIMITATIONS: meal prep, cleaning, driving,  shopping, and community activity  PERSONAL FACTORS: Age, Fitness, Past/current experiences, Sex, Social background, Time since onset of injury/illness/exacerbation, and 1-2 comorbidities: fibromyalgia and HTN  are also affecting patient's functional outcome.   REHAB POTENTIAL: Good  CLINICAL DECISION MAKING: Stable/uncomplicated  EVALUATION COMPLEXITY: Low  PLAN:  PT FREQUENCY: 1x/week  PT DURATION: 8 weeks  PLANNED INTERVENTIONS: Therapeutic exercises, Therapeutic activity, Neuromuscular re-education, Balance training, Gait training, Patient/Family education, Self Care, Joint mobilization, Stair training, Vestibular training, DME instructions, Manual therapy, and Re-evaluation  PLAN FOR NEXT SESSION: Check L BP!   Finish reviewing prior HEP, general balance/LE strength/endurance, review stair safety  Bary Richard, PT, DPT 09/10/2022, 11:45 AM

## 2022-09-12 DIAGNOSIS — R3121 Asymptomatic microscopic hematuria: Secondary | ICD-10-CM | POA: Diagnosis not present

## 2022-09-12 DIAGNOSIS — N289 Disorder of kidney and ureter, unspecified: Secondary | ICD-10-CM | POA: Diagnosis not present

## 2022-09-13 DIAGNOSIS — J301 Allergic rhinitis due to pollen: Secondary | ICD-10-CM | POA: Diagnosis not present

## 2022-09-13 DIAGNOSIS — J3089 Other allergic rhinitis: Secondary | ICD-10-CM | POA: Diagnosis not present

## 2022-09-13 DIAGNOSIS — J3081 Allergic rhinitis due to animal (cat) (dog) hair and dander: Secondary | ICD-10-CM | POA: Diagnosis not present

## 2022-09-17 ENCOUNTER — Ambulatory Visit: Payer: Medicare HMO | Admitting: Physical Therapy

## 2022-09-17 ENCOUNTER — Encounter: Payer: Self-pay | Admitting: Physical Therapy

## 2022-09-17 VITALS — BP 142/79 | HR 88

## 2022-09-17 DIAGNOSIS — D496 Neoplasm of unspecified behavior of brain: Secondary | ICD-10-CM | POA: Diagnosis not present

## 2022-09-17 DIAGNOSIS — R2689 Other abnormalities of gait and mobility: Secondary | ICD-10-CM | POA: Diagnosis not present

## 2022-09-17 DIAGNOSIS — R339 Retention of urine, unspecified: Secondary | ICD-10-CM | POA: Diagnosis not present

## 2022-09-17 DIAGNOSIS — M6281 Muscle weakness (generalized): Secondary | ICD-10-CM

## 2022-09-17 DIAGNOSIS — R2681 Unsteadiness on feet: Secondary | ICD-10-CM | POA: Diagnosis not present

## 2022-09-17 DIAGNOSIS — R278 Other lack of coordination: Secondary | ICD-10-CM | POA: Diagnosis not present

## 2022-09-17 DIAGNOSIS — Z9181 History of falling: Secondary | ICD-10-CM | POA: Diagnosis not present

## 2022-09-17 DIAGNOSIS — R32 Unspecified urinary incontinence: Secondary | ICD-10-CM | POA: Diagnosis not present

## 2022-09-17 NOTE — Therapy (Signed)
OUTPATIENT PHYSICAL THERAPY NEURO TREATMENT   Patient Name: Virginia Luna MRN: [id number] DOB:July 11, 1961, 62 y.o., female Today's Date: 09/17/2022   PCP: Shirline Frees, MD REFERRING PROVIDER: Courtney Heys, MD  END OF SESSION:  PT End of Session - 09/17/22 1021     Visit Number 3    Number of Visits 9    Date for PT Re-Evaluation 11/09/22   pushed out due to scheduling delay   Authorization Type [employer] MEDICARE    Authorization Time Period 09/03/22-11/09/22    Progress Note Due on Visit 10    PT Start Time 1018    PT Stop Time 1101    PT Time Calculation (min) 43 min    Equipment Utilized During Treatment Gait belt    Activity Tolerance Patient tolerated treatment well    Behavior During Therapy WFL for tasks assessed/performed              Past Medical History:  Diagnosis Date   Allergies    Anemia    Arthritis    Asthma    Back pain    Chronic pain    Complication of anesthesia    woke up during colonoscopy   Diabetes ([place])    Diabetes mellitus without complication (HCC)    Edema, lower extremity    Fibroid    Fibromyalgia    Chronic   GERD (gastroesophageal reflux disease)    Headache    migraines   High blood pressure    History of hiatal hernia    History of stomach ulcers    IBS (irritable bowel syndrome)    Joint pain    Sleep apnea    did use cpap-lost 25lb-says she does not need it   NO CPAP   Wears contact lenses    Past Surgical History:  Procedure Laterality Date   BRAIN SURGERY Left 11/07/2021   [facility] Dr. Herschel [place]   BREAST BIOPSY     BREAST EXCISIONAL BIOPSY     BREAST LUMPECTOMY WITH RADIOACTIVE SEED LOCALIZATION Bilateral 11/24/2014   Procedure: BILATERAL BREAST LUMPECTOMY WITH RADIOACTIVE SEED LOCALIZATION;  Surgeon: [name], MD;  Location: [place];  Service: General;  Laterality: Bilateral;   CHOLECYSTECTOMY     COLONOSCOPY     DILATION AND CURETTAGE OF UTERUS     ESOPHAGOGASTRODUODENOSCOPY  (EGD) WITH PROPOFOL N/A 07/23/2016   Procedure: ESOPHAGOGASTRODUODENOSCOPY (EGD) WITH PROPOFOL;  Surgeon: Laurence Spates, MD;  Location: [facility];  Service: Endoscopy;  Laterality: N/A;   ESOPHAGOGASTRODUODENOSCOPY (EGD) WITH PROPOFOL N/A 06/11/2018   Procedure: ESOPHAGOGASTRODUODENOSCOPY (EGD) WITH PROPOFOL;  Surgeon: Laurence Spates, MD;  Location: [facility];  Service: Endoscopy;  Laterality: N/A;   LUMBAR LAMINECTOMY  2010   ORIF WRIST FRACTURE Right 11/24/2020   Procedure: OPEN REDUCTION INTERNAL FIXATION RIGHT WRIST FRACTURE;  Surgeon: Renette Butters, MD;  Location: [facility];  Service: Orthopedics;  Laterality: Right;   UMBILICAL HERNIA REPAIR     age 85   UPPER GI ENDOSCOPY     Patient Active Problem List   Diagnosis Date Noted   Cerebellar tumor ([place]) 11/15/2021   Comorbid sleep-related hypoventilation 10/19/2021   Urinary incontinence/areflexic bladder 10/05/2021   OSA, unable to tolerate CPAP (vomited every night) 09/05/2021   Chronic pain syndrome 09/05/2021   Chronic nausea 09/05/2021   Neurogenic bladder, followed by Urology, instructed I&O cath BID 09/05/2021   Intolerance of continuous positive airway pressure (CPAP) ventilation 09/03/2021   Insomnia secondary to chronic pain 07/24/2021   Primary osteoarthritis  of right knee 07/03/2021   Myofascial pain dysfunction syndrome 07/03/2021   Fibromyalgia 07/03/2021   Hyponatremia 07/03/2021   Primary osteoarthritis of left knee 07/03/2021   Body mass index (BMI) 50.0-59.9, adult ([place]) 03/07/2021   Allergic rhinitis due to animal (cat) (dog) hair and dander 10/06/2020   Allergic rhinitis due to pollen 10/06/2020   Moderate persistent asthma, uncomplicated 16/05/9603   Cerebellar mass 10/06/2020   Paresthesia 05/27/2020   Diabetes mellitus ([place]) 05/25/2020   Hypertension associated with diabetes ([place]) 05/25/2020   Hyperlipidemia associated with type 2 diabetes mellitus ([place]) 05/25/2020   Vitamin D deficiency 05/25/2020    NAFLD (nonalcoholic fatty liver disease) 05/25/2020   Sciatica of left side 05/25/2020    ONSET DATE: 08/24/2022 (referral date-ongoing since March 2023)  REFERRING DIAG: D49.6 (ICD-10-CM) - Cerebellar tumor ([place])  THERAPY DIAG:  Other abnormalities of gait and mobility  Unsteadiness on feet  Muscle weakness (generalized)  Other lack of coordination  History of falling  Rationale for Evaluation and Treatment: Rehabilitation  SUBJECTIVE:                                                                                                                                                                                             SUBJECTIVE STATEMENT: Pt sees urology on 09/21/2022 for consult regarding kidney stone and other potential anatomic variance according to patient.  She further states the surgeon called her and stated she will be having surgery, but she is unsure when.  She denies falls or other acute changes.  She states she cannot use her CPAP right now and has to call her doctor because she has some blood coming out of her mouth. Pt accompanied by: self  PERTINENT HISTORY: DM2, HTN, left sciatica, fibromyalgia, asthma, and hyperlipidemia  From H&P note on 11/15/2021:  "On the day of admission 11/07/2021, the patient was taken to the operating room by Dr. Recardo Evangelist of neurosurgery and underwent left suboccipital craniotomy, resection of cerebellar tumor. She tolerated the procedure well. Postoperative MRI performed on 3/22 revealed a small area of diffusion restriction in the left cerebellum."  Patient transferred to CIR on 11/15/2021, discharged 11/24/2021 per pt.    Patient completed PT here 4/11-9/1 and a second episode at [facility] for same from November to December 2023.  PAIN:  Are you having pain? Yes: NPRS scale: 9/10 Pain location: all over Pain description: "different types of pain" (gestures to low back as stabbing) Aggravating factors: "I can't find anything to make  it better" Relieving factors: N/A  PRECAUTIONS: Fall  WEIGHT BEARING RESTRICTIONS: No  FALLS: Has patient fallen in last 6  months? Yes. Number of falls 1-"I don't really remember what happened"  She is unsure how long ago this happened.  She fell on the right side, right thigh hurts.  OBJECTIVE:  LUE VITALS: Today's Vitals   09/17/22 1027 09/17/22 1029  BP: (!) 163/97 (!) 142/79  Pulse: 83 88   TODAY'S TREATMENT:                                                                                                                              DATE:  Finished reviewing HEP: -Supine bridges w/ eccentric focus and prolonged hold for motor recruitment 2x10 -Supine SLR x10 each LE, pt endorses medial RLE pain that she states is related to prior fall (not recent), removed from HEP due to pain production -ITB side-lying stretch x45 seconds each side w/ only notable stretch and mild discomfort in right hip, removed due to pain production and concern for pt setup in home environment -Standing heel raises x5 w/ BUE support, pt unable to complete ROM w/ good form so removed from HEP in favor of seated heel raises -Forward and backward monster walks w/ red theraband at thighs 2x15 feet each direction -SLS on pillow w/ BUE support x30 sec each LE, edu on safe setup at home Reprinted tailored HEP. -SciFit x 5 minutes in hill mode at level 3.0 for HIIT style cardiovascular workout using BUE/BLE for increased cardiac demand.  PATIENT EDUCATION: Education details: Discussed immediate follow-up with MD about CPAP concern.  Discussed putting PT on hold once pt has surgery and obtaining return orders. Person educated: Patient Education method: Explanation Education comprehension: verbalized understanding and needs further education  HOME EXERCISE PROGRAM:  Access Code: [email or web address] URL: [email or web address] Date: 09/17/2022 Prepared by: Elease Etienne  Exercises - Sit to Stand Without  Arm Support  - 1 x daily - 7 x weekly - 3 sets - 10 reps - Chin Tuck  - 1 x daily - 7 x weekly - 3 sets - 10 reps - Seated Cervical Sidebending Stretch  - 1 x daily - 7 x weekly - 3 sets - 10 reps - Supine Bridge  - 1 x daily - 4 x weekly - 3 sets - 10 reps - Forward and Backward Monster Walk with Counter Support  - 1 x daily - 4 x weekly - 3 sets - 10 reps - Seated Heel Raise  - 1 x daily - 4 x weekly - 2 sets - 12 reps - Single Leg Stance on Pillow  - 1 x daily - 4 x weekly - 1 sets - 4 reps  GOALS: Goals reviewed with patient? Yes  SHORT TERM GOALS: Target date: 10/05/2022  Pt will be compliant and independent with advanced strength and balance HEP in order to maintain progress and promote carryover to home. Baseline:  Prior HEP to be reviewed. Goal status: INITIAL  2.  Pt will decrease 5xSTS to </=15 seconds w/ BUE support in order to  demonstrate decreased risk for falls and improved functional bilateral LE strength and power. Baseline: 18.72 sec w/ BUE support Goal status: INITIAL  3.  Pt will demonstrate TUG of </=13 seconds using LRAD in order to decrease risk of falls and improve functional mobility. Baseline: 14.22 seconds w/ rollator Goal status: INITIAL  4.  Pt will demonstrate a gait speed of >/=3.0 feet/sec using LRAD in order to decrease risk for falls. Baseline: 2.79 ft/sec w/ rollator Goal status: INITIAL  5.  Pt will increase BERG balance score to >/=41/56 to demonstrate improved static balance. Baseline: 37/56 Goal status: INITIAL  6.  FGA to be assessed at STG goal deadline w/ LTG set as appropriate. Baseline: To be assessed. Goal status: INITIAL  LONG TERM GOALS: Target date: 11/02/2022  Pt to be compliant to formal walking program or alternative aerobic exercise to maintain tolerance and ambulatory balance. Baseline: To be established. Goal status: INITIAL  2.  Pt will decrease 5xSTS to </=13 seconds w/o BUE support in order to demonstrate decreased risk  for falls and improved functional bilateral LE strength and power. Baseline: 18.72 seconds w/ BUE support Goal status: INITIAL  3.  Pt will demonstrate a gait speed of >/=3.3 feet/sec using LRAD in order to decrease risk for falls. Baseline: 2.79 ft/sec w/ rollator Goal status: INITIAL  4.  FGA to be assessed w/ LTG set. Baseline: To be assessed. Goal status: INITIAL  ASSESSMENT:  CLINICAL IMPRESSION: Finished updating patient's existing HEP this session to reflect current functional needs.  Pt continues to be challenged by decreased cardiac endurance and unsupported balance due to functional LE weakness.  She would benefit from dynamic tasks incorporating complex UE and LE movement patterns to further address these deficits.  OBJECTIVE IMPAIRMENTS: Abnormal gait, decreased balance, decreased coordination, decreased endurance, decreased strength, increased edema, improper body mechanics, postural dysfunction, obesity, and pain.   ACTIVITY LIMITATIONS: carrying, lifting, squatting, stairs, transfers, and locomotion level  PARTICIPATION LIMITATIONS: meal prep, cleaning, driving, shopping, and community activity  PERSONAL FACTORS: Age, Fitness, Past/current experiences, Sex, Social background, Time since onset of injury/illness/exacerbation, and 1-2 comorbidities: fibromyalgia and HTN  are also affecting patient's functional outcome.   REHAB POTENTIAL: Good  CLINICAL DECISION MAKING: Stable/uncomplicated  EVALUATION COMPLEXITY: Low  PLAN:  PT FREQUENCY: 1x/week  PT DURATION: 8 weeks  PLANNED INTERVENTIONS: Therapeutic exercises, Therapeutic activity, Neuromuscular re-education, Balance training, Gait training, Patient/Family education, Self Care, Joint mobilization, Stair training, Vestibular training, DME instructions, Manual therapy, and Re-evaluation  PLAN FOR NEXT SESSION: Check L BP!   Finish reviewing prior HEP, general balance/LE strength/endurance, review stair safety,  work on neck and postural stability, seated and standing boxing  Bary Richard, PT, DPT 09/17/2022, 11:04 AM

## 2022-09-18 DIAGNOSIS — J301 Allergic rhinitis due to pollen: Secondary | ICD-10-CM | POA: Diagnosis not present

## 2022-09-18 DIAGNOSIS — J3089 Other allergic rhinitis: Secondary | ICD-10-CM | POA: Diagnosis not present

## 2022-09-18 DIAGNOSIS — J3081 Allergic rhinitis due to animal (cat) (dog) hair and dander: Secondary | ICD-10-CM | POA: Diagnosis not present

## 2022-09-21 DIAGNOSIS — N200 Calculus of kidney: Secondary | ICD-10-CM | POA: Diagnosis not present

## 2022-09-21 DIAGNOSIS — N3946 Mixed incontinence: Secondary | ICD-10-CM | POA: Diagnosis not present

## 2022-09-21 DIAGNOSIS — R3121 Asymptomatic microscopic hematuria: Secondary | ICD-10-CM | POA: Diagnosis not present

## 2022-09-24 ENCOUNTER — Encounter: Payer: Self-pay | Admitting: Gastroenterology

## 2022-09-24 ENCOUNTER — Ambulatory Visit: Payer: Medicare HMO | Admitting: Physical Therapy

## 2022-09-25 ENCOUNTER — Other Ambulatory Visit: Payer: Self-pay | Admitting: Urology

## 2022-09-25 DIAGNOSIS — J3081 Allergic rhinitis due to animal (cat) (dog) hair and dander: Secondary | ICD-10-CM | POA: Diagnosis not present

## 2022-09-25 DIAGNOSIS — J3089 Other allergic rhinitis: Secondary | ICD-10-CM | POA: Diagnosis not present

## 2022-09-25 DIAGNOSIS — J301 Allergic rhinitis due to pollen: Secondary | ICD-10-CM | POA: Diagnosis not present

## 2022-09-27 ENCOUNTER — Encounter (HOSPITAL_COMMUNITY): Payer: Self-pay

## 2022-09-27 NOTE — Patient Instructions (Addendum)
SURGICAL WAITING ROOM VISITATION Patients having surgery or a procedure may have no more than 2 support people in the waiting area - these visitors may rotate.    If the patient needs to stay at the hospital during part of their recovery, the visitor guidelines for inpatient rooms apply. Pre-op nurse will coordinate an appropriate time for 1 support person to accompany patient in pre-op.  This support person may not rotate.    Please refer to the [facility] website for the visitor guidelines for Inpatients (after your surgery is over and you are in a regular room).   Due to an increase in RSV and influenza rates and associated hospitalizations, children ages 78 and under may not visit patients in [place].     Your procedure is scheduled on: 10-16-22   Report to [facility] Main Entrance    Report to admitting at 8:45 AM   Call this number if you have problems the morning of surgery [phone]   Do not eat food or drink liquids:After Midnight.   After Midnight you may have the following liquids until AM/ PM DAY OF SURGERY  Water Non-Citrus Juices (without pulp, NO RED) Carbonated Beverages Black Coffee (NO MILK/CREAM OR CREAMERS, sugar ok)  Clear Tea (NO MILK/CREAM OR CREAMERS, sugar ok) regular and decaf                             Plain Jell-O (NO RED)                                           Fruit ices (not with fruit pulp, NO RED)                                     Popsicles (NO RED)                                                               Sports drinks like Gatorade (NO RED)                      If you have questions, please contact your surgeon's office.   FOLLOW ANY ADDITIONAL PRE OP INSTRUCTIONS YOU RECEIVED FROM YOUR SURGEON'S OFFICE!!!     Oral Hygiene is also important to reduce your risk of infection.                                    Remember - BRUSH YOUR TEETH THE MORNING OF SURGERY WITH YOUR REGULAR TOOTHPASTE   Do NOT smoke  after Midnight   Take these medicines the morning of surgery with A SIP OF WATER:   Amlodipine  Gabapentin  Pantoprazole  Zonisamide  Tylenol, Meclizine, Ondansetron and Maxalt if needed  Okay to use eyedrops, inhalers and nasal spray   How to Manage Your Diabetes Before and After Surgery  Why is it important to control my blood sugar before and after surgery? Improving blood sugar levels before  and after surgery helps healing and can limit problems. A way of improving blood sugar control is eating a healthy diet by:  Eating less sugar and carbohydrates  Increasing activity/exercise  Talking with your doctor about reaching your blood sugar goals High blood sugars (greater than 180 mg/dL) can raise your risk of infections and slow your recovery, so you will need to focus on controlling your diabetes during the weeks before surgery. Make sure that the doctor who takes care of your diabetes knows about your planned surgery including the date and location.  How do I manage my blood sugar before surgery? Check your blood sugar at least 4 times a day, starting 2 days before surgery, to make sure that the level is not too high or low. Check your blood sugar the morning of your surgery when you wake up and every 2 hours until you get to the Short Stay unit. If your blood sugar is less than 70 mg/dL, you will need to treat for low blood sugar: Do not take insulin. Treat a low blood sugar (less than 70 mg/dL) with  cup of clear juice (cranberry or apple), 4 glucose tablets, OR glucose gel. Recheck blood sugar in 15 minutes after treatment (to make sure it is greater than 70 mg/dL). If your blood sugar is not greater than 70 mg/dL on recheck, call [phone] for further instructions. Report your blood sugar to the short stay nurse when you get to Short Stay.  If you are admitted to the hospital after surgery: Your blood sugar will be checked by the staff and you will probably be given insulin  after surgery (instead of oral diabetes medicines) to make sure you have good blood sugar levels. The goal for blood sugar control after surgery is 80-180 mg/dL.   WHAT DO I DO ABOUT MY DIABETES MEDICATION?  Do not take oral diabetes medicines (pills) the morning of surgery.  Hold Ozempic 7 days before surgery (do not take after 10-08-22)  DO NOT TAKE THE FOLLOWING 7 DAYS PRIOR TO SURGERY: Ozempic, Wegovy, Rybelsus (Semaglutide), Byetta (exenatide), Bydureon (exenatide ER), Victoza, Saxenda (liraglutide), or Trulicity (dulaglutide) Mounjaro (Tirzepatide) Adlyxin (Lixisenatide), Polyethylene Glycol Loxenatide.  Reviewed and Endorsed by [facility] Patient Education Committee, August 2015  Bring CPAP mask and tubing day of surgery.                              You may not have any metal on your body including hair pins, jewelry, and body piercing             Do not wear make-up, lotions, powders, perfumes or deodorant  Do not wear nail polish including gel and S&S, artificial/acrylic nails, or any other type of covering on natural nails including finger and toenails. If you have artificial nails, gel coating, etc. that needs to be removed by a nail salon please have this removed prior to surgery or surgery may need to be canceled/ delayed if the surgeon/ anesthesia feels like they are unable to be safely monitored.   Do not shave  48 hours prior to surgery.    Do not bring valuables to the hospital. [place].   Contacts, dentures or bridgework may not be worn into surgery.   DO NOT [place]. PHARMACY WILL DISPENSE MEDICATIONS LISTED ON YOUR MEDICATION LIST TO YOU DURING YOUR ADMISSION [place]!  Patients discharged on the day of surgery will not be allowed to drive home.  Someone NEEDS to stay with you for the first 24 hours after anesthesia.              Please read over the following fact sheets you were  given: IF Virginia Luna  If you received a COVID test during your pre-op visit  it is requested that you wear a mask when out in public, stay away from anyone that may not be feeling well and notify your surgeon if you develop symptoms. If you test positive for Covid or have been in contact with anyone that has tested positive in the last 10 days please notify you surgeon.  [place] - Preparing for Surgery Before surgery, you can play an important role.  Because skin is not sterile, your skin needs to be as free of germs as possible.  You can reduce the number of germs on your skin by washing with CHG (chlorahexidine gluconate) soap before surgery.  CHG is an antiseptic cleaner which kills germs and bonds with the skin to continue killing germs even after washing. Please DO NOT use if you have an allergy to CHG or antibacterial soaps.  If your skin becomes reddened/irritated stop using the CHG and inform your nurse when you arrive at Short Stay. Do not shave (including legs and underarms) for at least 48 hours prior to the first CHG shower.  You may shave your face/neck.  Please follow these instructions carefully:  1.  Shower with CHG Soap the night before surgery and the  morning of surgery.  2.  If you choose to wash your hair, wash your hair first as usual with your normal  shampoo.  3.  After you shampoo, rinse your hair and body thoroughly to remove the shampoo.                             4.  Use CHG as you would any other liquid soap.  You can apply chg directly to the skin and wash.  Gently with a scrungie or clean washcloth.  5.  Apply the CHG Soap to your body ONLY FROM THE NECK DOWN.   Do   not use on face/ open                           Wound or open sores. Avoid contact with eyes, ears mouth and   genitals (private parts).                       Wash face,  Genitals (private parts) with your normal soap.             6.   Wash thoroughly, paying special attention to the area where your    surgery  will be performed.  7.  Thoroughly rinse your body with warm water from the neck down.  8.  DO NOT shower/wash with your normal soap after using and rinsing off the CHG Soap.                9.  Pat yourself dry with a clean towel.            10.  Wear clean pajamas.            11.  Place clean  sheets on your bed the night of your first shower and do not  sleep with pets. Day of Surgery : Do not apply any lotions/deodorants the morning of surgery.  Please wear clean clothes to the hospital/surgery center.  FAILURE TO FOLLOW THESE INSTRUCTIONS MAY RESULT IN THE CANCELLATION OF YOUR SURGERY  PATIENT SIGNATURE_________________________________  NURSE SIGNATURE__________________________________  ________________________________________________________________________

## 2022-09-27 NOTE — Progress Notes (Addendum)
COVID Vaccine Completed:  Yes  Date of COVID positive in last 90 days:  No  PCP - Shirline Frees, MD Cardiologist - N/A  Chest x-ray -11-07-21 1V CEW EKG - 02-08-22 Epic Stress Test - several years ago ECHO - 08-22-21 Epic Cardiac Cath - N/A Pacemaker/ICD device last checked: Spinal Cord Stimulator :N/A  Bowel Prep - N/A  Sleep Study - Yes, +sleep apnea CPAP - Yes  Fasting Blood Sugar -  Checks Blood Sugar - does not check   Ozempic (Sunday) Last dose of GLP1 agonist-  09-24-22 GLP1 instructions:  Do not take after 10-08-22   Last dose of SGLT-2 inhibitors-  N/A SGLT-2 instructions: N/A  Blood Thinner Instructions:  N/A Aspirin Instructions: Last Dose:  Activity level:  Difficulty climibing stairs due to fibromyalgia.  Able to perform activities of daily living without stopping and without symptoms of chest pain or shortness of breath.  Anesthesia review:  N/A  Patient denies shortness of breath, fever, cough and chest pain at PAT appointment  Patient verbalized understanding of instructions that were given to them at the PAT appointment. Patient was also instructed that they will need to review over the PAT instructions again at home before surgery.

## 2022-09-28 ENCOUNTER — Encounter (HOSPITAL_COMMUNITY): Payer: Self-pay

## 2022-09-28 ENCOUNTER — Encounter (HOSPITAL_COMMUNITY)
Admission: RE | Admit: 2022-09-28 | Discharge: 2022-09-28 | Disposition: A | Discharge status: Home / Self Care | Payer: Medicare HMO | Source: Ambulatory Visit | Attending: Urology | Admitting: Urology

## 2022-09-28 ENCOUNTER — Other Ambulatory Visit: Payer: Self-pay | Admitting: Internal Medicine

## 2022-09-28 ENCOUNTER — Other Ambulatory Visit: Payer: Self-pay

## 2022-09-28 VITALS — BP 142/94 | HR 87 | Temp 98.1°F | Resp 16 | Ht 62.0 in | Wt 257.8 lb

## 2022-09-28 DIAGNOSIS — Z01812 Encounter for preprocedural laboratory examination: Secondary | ICD-10-CM | POA: Insufficient documentation

## 2022-09-28 DIAGNOSIS — K769 Liver disease, unspecified: Secondary | ICD-10-CM | POA: Diagnosis not present

## 2022-09-28 DIAGNOSIS — D649 Anemia, unspecified: Secondary | ICD-10-CM | POA: Diagnosis not present

## 2022-09-28 DIAGNOSIS — R1319 Other dysphagia: Secondary | ICD-10-CM | POA: Diagnosis not present

## 2022-09-28 DIAGNOSIS — E119 Type 2 diabetes mellitus without complications: Secondary | ICD-10-CM | POA: Insufficient documentation

## 2022-09-28 DIAGNOSIS — K5909 Other constipation: Secondary | ICD-10-CM | POA: Diagnosis not present

## 2022-09-28 DIAGNOSIS — I10 Essential (primary) hypertension: Secondary | ICD-10-CM

## 2022-09-28 DIAGNOSIS — R9389 Abnormal findings on diagnostic imaging of other specified body structures: Secondary | ICD-10-CM | POA: Diagnosis not present

## 2022-09-28 HISTORY — DX: Personal history of urinary calculi: Z87.442

## 2022-09-28 HISTORY — DX: Fatty (change of) liver, not elsewhere classified: K76.0

## 2022-09-28 LAB — CBC
HCT: 37.3 % (ref 36.0–46.0)
Hemoglobin: 11.2 g/dL — ABNORMAL LOW (ref 12.0–15.0)
MCH: 26.5 pg (ref 26.0–34.0)
MCHC: 30 g/dL (ref 30.0–36.0)
MCV: 88.4 fL (ref 80.0–100.0)
Platelets: 408 10*3/uL — ABNORMAL HIGH (ref 150–400)
RBC: 4.22 MIL/uL (ref 3.87–5.11)
RDW: 17.5 % — ABNORMAL HIGH (ref 11.5–15.5)
WBC: 7.3 10*3/uL (ref 4.0–10.5)
nRBC: 0 % (ref 0.0–0.2)

## 2022-09-28 LAB — COMPREHENSIVE METABOLIC PANEL
ALT: 30 U/L (ref 0–44)
AST: 17 U/L (ref 15–41)
Albumin: 4.5 g/dL (ref 3.5–5.0)
Alkaline Phosphatase: 145 U/L — ABNORMAL HIGH (ref 38–126)
Anion gap: 10 (ref 5–15)
BUN: 16 mg/dL (ref 6–20)
CO2: 26 mmol/L (ref 22–32)
Calcium: 9.2 mg/dL (ref 8.9–10.3)
Chloride: 108 mmol/L (ref 98–111)
Creatinine, Ser: 0.5 mg/dL (ref 0.44–1.00)
GFR, Estimated: >60 mL/min (ref >60)
Glucose, Bld: 103 mg/dL — ABNORMAL HIGH (ref 70–99)
Potassium: 3.3 mmol/L — ABNORMAL LOW (ref 3.5–5.1)
Sodium: 144 mmol/L (ref 135–145)
Total Bilirubin: 0.5 mg/dL (ref 0.3–1.2)
Total Protein: 7.9 g/dL (ref 6.5–8.1)

## 2022-09-28 LAB — HEMOGLOBIN A1C
Hgb A1c MFr Bld: 6.2 % — ABNORMAL HIGH (ref 4.8–5.6)
Mean Plasma Glucose: 131.24 mg/dL

## 2022-09-28 LAB — GLUCOSE, CAPILLARY: Glucose-Capillary: 102 mg/dL — ABNORMAL HIGH (ref 70–99)

## 2022-10-01 ENCOUNTER — Ambulatory Visit: Payer: Medicare HMO | Attending: Family Medicine | Admitting: Physical Therapy

## 2022-10-01 ENCOUNTER — Other Ambulatory Visit: Payer: Self-pay | Admitting: Physical Medicine and Rehabilitation

## 2022-10-01 ENCOUNTER — Encounter: Payer: Self-pay | Admitting: Physical Therapy

## 2022-10-01 VITALS — BP 117/60 | HR 93

## 2022-10-01 DIAGNOSIS — R2689 Other abnormalities of gait and mobility: Secondary | ICD-10-CM | POA: Diagnosis not present

## 2022-10-01 DIAGNOSIS — M6281 Muscle weakness (generalized): Secondary | ICD-10-CM | POA: Diagnosis not present

## 2022-10-01 DIAGNOSIS — Z9181 History of falling: Secondary | ICD-10-CM | POA: Diagnosis not present

## 2022-10-01 DIAGNOSIS — R278 Other lack of coordination: Secondary | ICD-10-CM | POA: Insufficient documentation

## 2022-10-01 DIAGNOSIS — R2681 Unsteadiness on feet: Secondary | ICD-10-CM | POA: Insufficient documentation

## 2022-10-01 NOTE — Therapy (Unsigned)
OUTPATIENT PHYSICAL THERAPY NEURO TREATMENT   Patient Name: Virginia Luna MRN: [id number] DOB:06/19/1961, 62 y.o., female Today's Date: 10/01/2022   PCP: Shirline Frees, MD REFERRING PROVIDER: Courtney Heys, MD  END OF SESSION:  PT End of Session - 10/01/22 1155     Visit Number 4    Number of Visits 9    Date for PT Re-Evaluation 11/09/22   pushed out due to scheduling delay   Authorization Type [employer]ICARE    Authorization Time Period 09/03/22-11/09/22    Progress Note Due on Visit 10    PT Start Time 1145    PT Stop Time 1230    PT Time Calculation (min) 45 min    Equipment Utilized During Treatment Gait belt    Activity Tolerance Patient tolerated treatment well    Behavior During Therapy WFL for tasks assessed/performed              Past Medical History:  Diagnosis Date   Allergies    Anemia    Arthritis    Asthma    Back pain    Chronic pain    Complication of anesthesia    woke up during colonoscopy   Diabetes ([place])    Diabetes mellitus without complication (HCC)    Edema, lower extremity    Fibroid    Fibromyalgia    Chronic   GERD (gastroesophageal reflux disease)    Headache    migraines   Hepatic steatosis    High blood pressure    History of hiatal hernia    History of kidney stones    History of stomach ulcers    IBS (irritable bowel syndrome)    Joint pain    Sleep apnea    did use cpap-lost 25lb-says she does not need it   NO CPAP   Wears contact lenses    Past Surgical History:  Procedure Laterality Date   BRAIN SURGERY Left 11/07/2021   [facility] Dr. Herschel [place]   BREAST BIOPSY     BREAST EXCISIONAL BIOPSY     BREAST LUMPECTOMY WITH RADIOACTIVE SEED LOCALIZATION Bilateral 11/24/2014   Procedure: BILATERAL BREAST LUMPECTOMY WITH RADIOACTIVE SEED LOCALIZATION;  Surgeon: [name], MD;  Location: [place];  Service: General;  Laterality: Bilateral;   CHOLECYSTECTOMY     COLONOSCOPY     DILATION AND  CURETTAGE OF UTERUS     ESOPHAGOGASTRODUODENOSCOPY (EGD) WITH PROPOFOL N/A 07/23/2016   Procedure: ESOPHAGOGASTRODUODENOSCOPY (EGD) WITH PROPOFOL;  Surgeon: Laurence Spates, MD;  Location: [facility];  Service: Endoscopy;  Laterality: N/A;   ESOPHAGOGASTRODUODENOSCOPY (EGD) WITH PROPOFOL N/A 06/11/2018   Procedure: ESOPHAGOGASTRODUODENOSCOPY (EGD) WITH PROPOFOL;  Surgeon: Laurence Spates, MD;  Location: [facility];  Service: Endoscopy;  Laterality: N/A;   LUMBAR LAMINECTOMY  2010   ORIF WRIST FRACTURE Right 11/24/2020   Procedure: OPEN REDUCTION INTERNAL FIXATION RIGHT WRIST FRACTURE;  Surgeon: Renette Butters, MD;  Location: [facility];  Service: Orthopedics;  Laterality: Right;   OVARIAN CYST SURGERY     UMBILICAL HERNIA REPAIR     age 47   UPPER GI ENDOSCOPY     Patient Active Problem List   Diagnosis Date Noted   Cerebellar tumor ([place]) 11/15/2021   Comorbid sleep-related hypoventilation 10/19/2021   Urinary incontinence/areflexic bladder 10/05/2021   OSA, unable to tolerate CPAP (vomited every night) 09/05/2021   Chronic pain syndrome 09/05/2021   Chronic nausea 09/05/2021   Neurogenic bladder, followed by Urology, instructed I&O cath BID 09/05/2021   Intolerance of  continuous positive airway pressure (CPAP) ventilation 09/03/2021   Insomnia secondary to chronic pain 07/24/2021   Primary osteoarthritis of right knee 07/03/2021   Myofascial pain dysfunction syndrome 07/03/2021   Fibromyalgia 07/03/2021   Hyponatremia 07/03/2021   Primary osteoarthritis of left knee 07/03/2021   Body mass index (BMI) 50.0-59.9, adult ([place]) 03/07/2021   Allergic rhinitis due to animal (cat) (dog) hair and dander 10/06/2020   Allergic rhinitis due to pollen 10/06/2020   Moderate persistent asthma, uncomplicated [id number]   Cerebellar mass 10/06/2020   Paresthesia 05/27/2020   Diabetes mellitus ([place]) 05/25/2020   Hypertension associated with diabetes ([place]) 05/25/2020   Hyperlipidemia associated  with type 2 diabetes mellitus ([place]) 05/25/2020   Vitamin D deficiency 05/25/2020   NAFLD (nonalcoholic fatty liver disease) 05/25/2020   Sciatica of left side 05/25/2020    ONSET DATE: 08/24/2022 (referral date-ongoing since March 2023)  REFERRING DIAG: D49.6 (ICD-10-CM) - Cerebellar tumor ([place])  THERAPY DIAG:  Other abnormalities of gait and mobility  Unsteadiness on feet  Muscle weakness (generalized)  History of falling  Other lack of coordination  Rationale for Evaluation and Treatment: Rehabilitation  SUBJECTIVE:                                                                                                                                                                                             SUBJECTIVE STATEMENT: Pt feels her symptoms are getting worse, but is unable to elaborate in what ways.  She describes having more issues with her right hand shaking today especially when helping in a daycare giving a child water in a cup.  She continues to work on her HEP with some increased shakes in the right leg.  HEP remains challenging per report.  She denies falls since last visit.  She has discussed CPAP w/ MD and has made them aware of the congestion issue. Pt accompanied by: self  PERTINENT HISTORY: DM2, HTN, left sciatica, fibromyalgia, asthma, and hyperlipidemia  From H&P note on 11/15/2021:  "On the day of admission 11/07/2021, the patient was taken to the operating room by Dr. Recardo Evangelist of neurosurgery and underwent left suboccipital craniotomy, resection of cerebellar tumor. She tolerated the procedure well. Postoperative MRI performed on 3/22 revealed a small area of diffusion restriction in the left cerebellum."  Patient transferred to CIR on 11/15/2021, discharged 11/24/2021 per pt.    Patient completed PT here 4/11-9/1 and a second episode at [facility] for same from November to December 2023.  PAIN:  Are you having pain? Yes: NPRS scale: 8.5/10 Pain location: head  all the way down the right side Pain description: "I  can't even describe it! Aggravating factors: unsure Relieving factors: no  PRECAUTIONS: Fall  WEIGHT BEARING RESTRICTIONS: No  FALLS: Has patient fallen in last 6 months? Yes. Number of falls 1-"I don't really remember what happened"  She is unsure how long ago this happened.  She fell on the right side, right thigh hurts.  OBJECTIVE:  LUE VITALS: Today's Vitals   10/01/22 1151  BP: 117/60  Pulse: 93   TODAY'S TREATMENT:                                                                                                                              -5xSTS:  17.25 seconds w/ BUE support -TUG (trial 1-pt ambulates past marker line): 15.50 seconds w/ rollator -TUG (trial 2): 12.56 sec w/ rollator -10MWT:  11.68 sec w/ rollator = 0.86 m/sec OR 2.83 ft/sec -BERG & FGA:  [facility] PT Assessment - 10/01/22 1218       Berg Balance Test   Sit to Stand Able to stand  independently using hands    Standing Unsupported Able to stand safely 2 minutes    Sitting with Back Unsupported but Feet Supported on Floor or Stool Able to sit safely and securely 2 minutes    Stand to Sit Sits safely with minimal use of hands    Transfers Able to transfer safely, definite need of hands    Standing Unsupported with Eyes Closed Able to stand 10 seconds safely    Standing Unsupported with Feet Together Able to place feet together independently and stand 1 minute safely    From Standing, Reach Forward with Outstretched Arm Can reach forward >5 cm safely (2")    From Standing Position, Pick up Object from Floor Able to pick up shoe safely and easily    From Standing Position, Turn to Look Behind Over each Shoulder Turn sideways only but maintains balance    Turn 360 Degrees Able to turn 360 degrees safely but slowly    Standing Unsupported, Alternately Place Feet on Step/Stool Able to stand independently and safely and complete 8 steps in 20 seconds    Standing  Unsupported, One Foot in Front Able to take small step independently and hold 30 seconds    Standing on One Leg Tries to lift leg/unable to hold 3 seconds but remains standing independently    Total Score 43      Functional Gait  Assessment   Gait assessed  Yes    Gait Level Surface Walks 20 ft in less than 7 sec but greater than 5.5 sec, uses assistive device, slower speed, mild gait deviations, or deviates 6-10 in outside of the 12 in walkway width.    Change in Gait Speed Makes only minor adjustments to walking speed, or accomplishes a change in speed with significant gait deviations, deviates 10-15 in outside the 12 in walkway width, or changes speed but loses balance but is able to recover and continue walking.  Gait with Horizontal Head Turns Performs head turns with moderate changes in gait velocity, slows down, deviates 10-15 in outside 12 in walkway width but recovers, can continue to walk.    Gait with Vertical Head Turns Performs task with moderate change in gait velocity, slows down, deviates 10-15 in outside 12 in walkway width but recovers, can continue to walk.    Gait and Pivot Turn Pivot turns safely in greater than 3 sec and stops with no loss of balance, or pivot turns safely within 3 sec and stops with mild imbalance, requires small steps to catch balance.    Step Over Obstacle Is able to step over one shoe box (4.5 in total height) but must slow down and adjust steps to clear box safely. May require verbal cueing.    Gait with Narrow Base of Support Ambulates less than 4 steps heel to toe or cannot perform without assistance.    Gait with Eyes Closed Walks 20 ft, slow speed, abnormal gait pattern, evidence for imbalance, deviates 10-15 in outside 12 in walkway width. Requires more than 9 sec to ambulate 20 ft.    Ambulating Backwards Walks 20 ft, slow speed, abnormal gait pattern, evidence for imbalance, deviates 10-15 in outside 12 in walkway width.    Steps Two feet to a  stair, must use rail.    Total Score 11              PATIENT EDUCATION: Education details:  PT provided written sticky note as reminder for pt to obtain precautions and duration of precautions signed from clinician performing kidney procedure on 10/16/2022. Person educated: Patient Education method: Explanation Education comprehension: verbalized understanding and needs further education  HOME EXERCISE PROGRAM:  Access Code: [email or web address] URL: [email or web address] Date: 09/17/2022 Prepared by: Elease Etienne  Exercises - Sit to Stand Without Arm Support  - 1 x daily - 7 x weekly - 3 sets - 10 reps - Chin Tuck  - 1 x daily - 7 x weekly - 3 sets - 10 reps - Seated Cervical Sidebending Stretch  - 1 x daily - 7 x weekly - 3 sets - 10 reps - Supine Bridge  - 1 x daily - 4 x weekly - 3 sets - 10 reps - Forward and Backward Monster Walk with Counter Support  - 1 x daily - 4 x weekly - 3 sets - 10 reps - Seated Heel Raise  - 1 x daily - 4 x weekly - 2 sets - 12 reps - Single Leg Stance on Pillow  - 1 x daily - 4 x weekly - 1 sets - 4 reps  GOALS: Goals reviewed with patient? Yes  SHORT TERM GOALS: Target date: 10/05/2022  Pt will be compliant and independent with advanced strength and balance HEP in order to maintain progress and promote carryover to home. Baseline:  Reviewed and modified, pt compliant (2/12) Goal status: MET  2.  Pt will decrease 5xSTS to </=15 seconds w/ BUE support in order to demonstrate decreased risk for falls and improved functional bilateral LE strength and power. Baseline: 18.72 sec w/ BUE support; 17.25 seconds w/ BUE support (2/12) Goal status: IN PROGRESS  3.  Pt will demonstrate TUG of </=13 seconds using LRAD in order to decrease risk of falls and improve functional mobility. Baseline: 14.22 seconds w/ rollator; 12.56 seconds w/ rollator (2/12) Goal status: MET  4.  Pt will demonstrate a gait speed of >/=3.0 feet/sec using LRAD in  order to decrease risk  for falls. Baseline: 2.79 ft/sec w/ rollator; 2.83 ft/sec w/ rollator (2/12) Goal status: IN PROGRESS  5.  Pt will increase BERG balance score to >/=41/56 to demonstrate improved static balance. Baseline: 37/56; 43/56 (2/12) Goal status: MET  6.  FGA to be assessed at STG goal deadline w/ LTG set as appropriate. Baseline: 11/30 (2/12) Goal status: MET  LONG TERM GOALS: Target date: 11/02/2022  Pt to be compliant to formal walking program or alternative aerobic exercise to maintain tolerance and ambulatory balance. Baseline: To be established. Goal status: INITIAL  2.  Pt will decrease 5xSTS to </=13 seconds w/o BUE support in order to demonstrate decreased risk for falls and improved functional bilateral LE strength and power. Baseline: 18.72 seconds w/ BUE support Goal status: INITIAL  3.  Pt will demonstrate a gait speed of >/=3.3 feet/sec using LRAD in order to decrease risk for falls. Baseline: 2.79 ft/sec w/ rollator Goal status: INITIAL  4.  Pt will improve FGA score to >/=19/30 in order to demonstrate improved balance and decreased fall risk. Baseline: 11/30 (2/12) Goal status: INITIAL  ASSESSMENT:  CLINICAL IMPRESSION: Finished updating patient's existing HEP this session to reflect current functional needs.  Pt continues to be challenged by decreased cardiac endurance and unsupported balance due to functional LE weakness.  She would benefit from dynamic tasks incorporating complex UE and LE movement patterns to further address these deficits.  OBJECTIVE IMPAIRMENTS: Abnormal gait, decreased balance, decreased coordination, decreased endurance, decreased strength, increased edema, improper body mechanics, postural dysfunction, obesity, and pain.   ACTIVITY LIMITATIONS: carrying, lifting, squatting, stairs, transfers, and locomotion level  PARTICIPATION LIMITATIONS: meal prep, cleaning, driving, shopping, and community activity  PERSONAL FACTORS:  Age, Fitness, Past/current experiences, Sex, Social background, Time since onset of injury/illness/exacerbation, and 1-2 comorbidities: fibromyalgia and HTN  are also affecting patient's functional outcome.   REHAB POTENTIAL: Good  CLINICAL DECISION MAKING: Stable/uncomplicated  EVALUATION COMPLEXITY: Low  PLAN:  PT FREQUENCY: 1x/week  PT DURATION: 8 weeks  PLANNED INTERVENTIONS: Therapeutic exercises, Therapeutic activity, Neuromuscular re-education, Balance training, Gait training, Patient/Family education, Self Care, Joint mobilization, Stair training, Vestibular training, DME instructions, Manual therapy, and Re-evaluation  PLAN FOR NEXT SESSION: Check L BP!   Finish reviewing prior HEP, general balance/LE strength/endurance, review stair safety, work on neck and postural stability, seated and standing boxing  Bary Richard, PT, DPT 10/01/2022, 12:33 PM

## 2022-10-02 DIAGNOSIS — J3081 Allergic rhinitis due to animal (cat) (dog) hair and dander: Secondary | ICD-10-CM | POA: Diagnosis not present

## 2022-10-02 DIAGNOSIS — J301 Allergic rhinitis due to pollen: Secondary | ICD-10-CM | POA: Diagnosis not present

## 2022-10-02 DIAGNOSIS — J3089 Other allergic rhinitis: Secondary | ICD-10-CM | POA: Diagnosis not present

## 2022-10-05 DIAGNOSIS — E119 Type 2 diabetes mellitus without complications: Secondary | ICD-10-CM | POA: Diagnosis not present

## 2022-10-05 DIAGNOSIS — E7800 Pure hypercholesterolemia, unspecified: Secondary | ICD-10-CM | POA: Diagnosis not present

## 2022-10-05 DIAGNOSIS — K219 Gastro-esophageal reflux disease without esophagitis: Secondary | ICD-10-CM | POA: Diagnosis not present

## 2022-10-05 DIAGNOSIS — I10 Essential (primary) hypertension: Secondary | ICD-10-CM | POA: Diagnosis not present

## 2022-10-08 ENCOUNTER — Ambulatory Visit: Payer: Medicare HMO | Admitting: Physical Therapy

## 2022-10-08 ENCOUNTER — Encounter: Payer: Self-pay | Admitting: Physical Therapy

## 2022-10-08 VITALS — BP 138/75 | HR 88

## 2022-10-08 DIAGNOSIS — R2681 Unsteadiness on feet: Secondary | ICD-10-CM

## 2022-10-08 DIAGNOSIS — M6281 Muscle weakness (generalized): Secondary | ICD-10-CM | POA: Diagnosis not present

## 2022-10-08 DIAGNOSIS — Z9181 History of falling: Secondary | ICD-10-CM | POA: Diagnosis not present

## 2022-10-08 DIAGNOSIS — R278 Other lack of coordination: Secondary | ICD-10-CM

## 2022-10-08 DIAGNOSIS — R2689 Other abnormalities of gait and mobility: Secondary | ICD-10-CM | POA: Diagnosis not present

## 2022-10-08 NOTE — Therapy (Unsigned)
OUTPATIENT PHYSICAL THERAPY NEURO TREATMENT   Patient Name: Virginia Luna MRN: [id number] DOB:05/27/1961, 62 y.o., female Today's Date: 10/08/2022   PCP: Shirline Frees, MD REFERRING PROVIDER: Courtney Heys, MD  END OF SESSION:  PT End of Session - 10/08/22 1112     Visit Number 5    Number of Visits 9    Date for PT Re-Evaluation 11/09/22   pushed out due to scheduling delay   Authorization Type [employer] MEDICARE    Authorization Time Period 09/03/22-11/09/22    Progress Note Due on Visit 10    PT Start Time 1105    PT Stop Time 1149    PT Time Calculation (min) 44 min    Equipment Utilized During Treatment Gait belt    Activity Tolerance Patient tolerated treatment well    Behavior During Therapy WFL for tasks assessed/performed              Past Medical History:  Diagnosis Date   Allergies    Anemia    Arthritis    Asthma    Back pain    Chronic pain    Complication of anesthesia    woke up during colonoscopy   Diabetes ([place])    Diabetes mellitus without complication (HCC)    Edema, lower extremity    Fibroid    Fibromyalgia    Chronic   GERD (gastroesophageal reflux disease)    Headache    migraines   Hepatic steatosis    High blood pressure    History of hiatal hernia    History of kidney stones    History of stomach ulcers    IBS (irritable bowel syndrome)    Joint pain    Sleep apnea    did use cpap-lost 25lb-says she does not need it   NO CPAP   Wears contact lenses    Past Surgical History:  Procedure Laterality Date   BRAIN SURGERY Left 11/07/2021   [facility] Dr. Herschel [place]   BREAST BIOPSY     BREAST EXCISIONAL BIOPSY     BREAST LUMPECTOMY WITH RADIOACTIVE SEED LOCALIZATION Bilateral 11/24/2014   Procedure: BILATERAL BREAST LUMPECTOMY WITH RADIOACTIVE SEED LOCALIZATION;  Surgeon: [name], MD;  Location: [place];  Service: General;  Laterality: Bilateral;   CHOLECYSTECTOMY     COLONOSCOPY     DILATION AND  CURETTAGE OF UTERUS     ESOPHAGOGASTRODUODENOSCOPY (EGD) WITH PROPOFOL N/A 07/23/2016   Procedure: ESOPHAGOGASTRODUODENOSCOPY (EGD) WITH PROPOFOL;  Surgeon: Laurence Spates, MD;  [facility];  Service: Endoscopy;  Laterality: N/A;   ESOPHAGOGASTRODUODENOSCOPY (EGD) WITH PROPOFOL N/A 06/11/2018   Procedure: ESOPHAGOGASTRODUODENOSCOPY (EGD) WITH PROPOFOL;  Surgeon: Laurence Spates, MD;  [facility];  Service: Endoscopy;  Laterality: N/A;   LUMBAR LAMINECTOMY  2010   ORIF WRIST FRACTURE Right 11/24/2020   Procedure: OPEN REDUCTION INTERNAL FIXATION RIGHT WRIST FRACTURE;  Surgeon: Renette Butters, MD;  [facility];  Service: Orthopedics;  Laterality: Right;   OVARIAN CYST SURGERY     UMBILICAL HERNIA REPAIR     age 5   UPPER GI ENDOSCOPY     Patient Active Problem List   Diagnosis Date Noted   Cerebellar tumor ([place]) 11/15/2021   Comorbid sleep-related hypoventilation 10/19/2021   Urinary incontinence/areflexic bladder 10/05/2021   OSA, unable to tolerate CPAP (vomited every night) 09/05/2021   Chronic pain syndrome 09/05/2021   Chronic nausea 09/05/2021   Neurogenic bladder, followed by Urology, instructed I&O cath BID 09/05/2021   Intolerance of  continuous positive airway pressure (CPAP) ventilation 09/03/2021   Insomnia secondary to chronic pain 07/24/2021   Primary osteoarthritis of right knee 07/03/2021   Myofascial pain dysfunction syndrome 07/03/2021   Fibromyalgia 07/03/2021   Hyponatremia 07/03/2021   Primary osteoarthritis of left knee 07/03/2021   Body mass index (BMI) 50.0-59.9, adult ([place]) 03/07/2021   Allergic rhinitis due to animal (cat) (dog) hair and dander 10/06/2020   Allergic rhinitis due to pollen 10/06/2020   Moderate persistent asthma, uncomplicated 123456   Cerebellar mass 10/06/2020   Paresthesia 05/27/2020   Diabetes mellitus ([place]) 05/25/2020   Hypertension associated with diabetes ([place]) 05/25/2020   Hyperlipidemia associated  with type 2 diabetes mellitus ([place]) 05/25/2020   Vitamin D deficiency 05/25/2020   NAFLD (nonalcoholic fatty liver disease) 05/25/2020   Sciatica of left side 05/25/2020    ONSET DATE: 08/24/2022 (referral date-ongoing since March 2023)  REFERRING DIAG: D49.6 (ICD-10-CM) - Cerebellar tumor ([place])  THERAPY DIAG:  Other abnormalities of gait and mobility  Unsteadiness on feet  Muscle weakness (generalized)  History of falling  Other lack of coordination  Rationale for Evaluation and Treatment: Rehabilitation  SUBJECTIVE:                                                                                                                                                                                             SUBJECTIVE STATEMENT: Pt endorses one near fall where both legs were just giving out.  She is feeling more shaky today, especially in her head and neck. Pt accompanied by: self  PERTINENT HISTORY: DM2, HTN, left sciatica, fibromyalgia, asthma, and hyperlipidemia  From H&P note on 11/15/2021:  "On the day of admission 11/07/2021, the patient was taken to the operating room by Dr. Recardo Evangelist of neurosurgery and underwent left suboccipital craniotomy, resection of cerebellar tumor. She tolerated the procedure well. Postoperative MRI performed on 3/22 revealed a small area of diffusion restriction in the left cerebellum."  Patient transferred to CIR on 11/15/2021, discharged 11/24/2021 per pt.    Patient completed PT here 4/11-9/1 and a second episode at [facility] for same from November to December 2023.  PAIN:  Are you having pain? No-just shakiness  PRECAUTIONS: Fall  WEIGHT BEARING RESTRICTIONS: No  FALLS: Has patient fallen in last 6 months? Yes. Number of falls 1-"I don't really remember what happened"  She is unsure how long ago this happened.  She fell on the right side, right thigh hurts.  OBJECTIVE:  LUE VITALS: Today's Vitals   10/08/22 1109  BP: 138/75  Pulse: 88    TODAY'S TREATMENT:                                                                                                                              -  SciFit x71mns in hill mode at level 4.0 using BUE/BLE for neural priming using large amplitude reciprocal movements.  RPE of 6-7/10 following task. -Seated cross jabs 2x45 seconds -Seated random target jabs x1 min 15 seconds -Standing jabs 2x30 seconds at punching bag -Standing cross jabs w/ step forward 2 rounds -Standing cross jabs w/ alternating LE kick to base target 2 rounds Varying time between sets for seated rest breaks  PATIENT EDUCATION: Education details:  PT provided written sticky note as reminder for pt to obtain precautions and duration of precautions signed from clinician performing kidney procedure on 10/16/2022. Person educated: Patient Education method: Explanation Education comprehension: verbalized understanding and needs further education  HOME EXERCISE PROGRAM:  Access Code: [email or web address] URL: [email or web address] Date: 09/17/2022 Prepared by: MElease Etienne Exercises - Sit to Stand Without Arm Support  - 1 x daily - 7 x weekly - 3 sets - 10 reps - Chin Tuck  - 1 x daily - 7 x weekly - 3 sets - 10 reps - Seated Cervical Sidebending Stretch  - 1 x daily - 7 x weekly - 3 sets - 10 reps - Supine Bridge  - 1 x daily - 4 x weekly - 3 sets - 10 reps - Forward and Backward Monster Walk with Counter Support  - 1 x daily - 4 x weekly - 3 sets - 10 reps - Seated Heel Raise  - 1 x daily - 4 x weekly - 2 sets - 12 reps - Single Leg Stance on Pillow  - 1 x daily - 4 x weekly - 1 sets - 4 reps  GOALS: Goals reviewed with patient? Yes  SHORT TERM GOALS: Target date: 10/05/2022  Pt will be compliant and independent with advanced strength and balance HEP in order to maintain progress and promote carryover to home. Baseline:  Reviewed and modified, pt compliant (2/12) Goal status: MET  2.  Pt will decrease 5xSTS  to </=15 seconds w/ BUE support in order to demonstrate decreased risk for falls and improved functional bilateral LE strength and power. Baseline: 18.72 sec w/ BUE support; 17.25 seconds w/ BUE support (2/12) Goal status: IN PROGRESS  3.  Pt will demonstrate TUG of </=13 seconds using LRAD in order to decrease risk of falls and improve functional mobility. Baseline: 14.22 seconds w/ rollator; 12.56 seconds w/ rollator (2/12) Goal status: MET  4.  Pt will demonstrate a gait speed of >/=3.0 feet/sec using LRAD in order to decrease risk for falls. Baseline: 2.79 ft/sec w/ rollator; 2.83 ft/sec w/ rollator (2/12) Goal status: IN PROGRESS  5.  Pt will increase BERG balance score to >/=41/56 to demonstrate improved static balance. Baseline: 37/56; 43/56 (2/12) Goal status: MET  6.  FGA to be assessed at STG goal deadline w/ LTG set as appropriate. Baseline: 11/30 (2/12) Goal status: MET  LONG TERM GOALS: Target date: 11/02/2022  Pt to be compliant to formal walking program or alternative aerobic exercise to maintain tolerance and ambulatory balance. Baseline: To be established. Goal status: INITIAL  2.  Pt will decrease 5xSTS to </=13 seconds w/o BUE support in order to demonstrate decreased risk for falls and improved functional bilateral LE strength and power. Baseline: 18.72 seconds w/ BUE support Goal status: INITIAL  3.  Pt will demonstrate a gait speed of >/=3.3 feet/sec using LRAD in order to decrease risk for falls. Baseline: 2.79 ft/sec w/ rollator Goal status: INITIAL  4.  Pt will improve FGA score to >/=19/30 in order to demonstrate  improved balance and decreased fall risk. Baseline: 11/30 (2/12) Goal status: INITIAL  ASSESSMENT:  CLINICAL IMPRESSION: Emphasis of skilled session on aerobic tolerance and standing anticipatory balance strategies using boxing tasks.  She exhibits mild increase in myoclonus throughout session today, but otherwise did not demonstrate  excessive LOB and tolerated all interventions well with some fatigue.  She continues to benefit from skilled PT to further address dynamic balance strategies and general activity tolerance.  OBJECTIVE IMPAIRMENTS: Abnormal gait, decreased balance, decreased coordination, decreased endurance, decreased strength, increased edema, improper body mechanics, postural dysfunction, obesity, and pain.   ACTIVITY LIMITATIONS: carrying, lifting, squatting, stairs, transfers, and locomotion level  PARTICIPATION LIMITATIONS: meal prep, cleaning, driving, shopping, and community activity  PERSONAL FACTORS: Age, Fitness, Past/current experiences, Sex, Social background, Time since onset of injury/illness/exacerbation, and 1-2 comorbidities: fibromyalgia and HTN  are also affecting patient's functional outcome.   REHAB POTENTIAL: Good  CLINICAL DECISION MAKING: Stable/uncomplicated  EVALUATION COMPLEXITY: Low  PLAN:  PT FREQUENCY: 1x/week  PT DURATION: 8 weeks  PLANNED INTERVENTIONS: Therapeutic exercises, Therapeutic activity, Neuromuscular re-education, Balance training, Gait training, Patient/Family education, Self Care, Joint mobilization, Stair training, Vestibular training, DME instructions, Manual therapy, and Re-evaluation  PLAN FOR NEXT SESSION: Check L BP!   Finish reviewing prior HEP, general balance/LE strength/endurance, review stair safety, work on neck and postural stability, standing boxing bag-try with step over?  Bary Richard, PT, DPT 10/08/2022, 11:49 AM

## 2022-10-09 DIAGNOSIS — K5909 Other constipation: Secondary | ICD-10-CM | POA: Diagnosis not present

## 2022-10-09 DIAGNOSIS — F418 Other specified anxiety disorders: Secondary | ICD-10-CM | POA: Diagnosis not present

## 2022-10-09 DIAGNOSIS — N200 Calculus of kidney: Secondary | ICD-10-CM | POA: Diagnosis not present

## 2022-10-09 DIAGNOSIS — Z6841 Body Mass Index (BMI) 40.0 and over, adult: Secondary | ICD-10-CM | POA: Diagnosis not present

## 2022-10-10 DIAGNOSIS — J3081 Allergic rhinitis due to animal (cat) (dog) hair and dander: Secondary | ICD-10-CM | POA: Diagnosis not present

## 2022-10-10 DIAGNOSIS — J3089 Other allergic rhinitis: Secondary | ICD-10-CM | POA: Diagnosis not present

## 2022-10-10 DIAGNOSIS — J301 Allergic rhinitis due to pollen: Secondary | ICD-10-CM | POA: Diagnosis not present

## 2022-10-15 ENCOUNTER — Encounter: Payer: Self-pay | Admitting: Physical Therapy

## 2022-10-15 ENCOUNTER — Ambulatory Visit: Payer: Medicare HMO | Admitting: Physical Therapy

## 2022-10-15 VITALS — BP 135/95 | HR 88

## 2022-10-15 DIAGNOSIS — R2689 Other abnormalities of gait and mobility: Secondary | ICD-10-CM | POA: Diagnosis not present

## 2022-10-15 DIAGNOSIS — R2681 Unsteadiness on feet: Secondary | ICD-10-CM | POA: Diagnosis not present

## 2022-10-15 DIAGNOSIS — Z9181 History of falling: Secondary | ICD-10-CM

## 2022-10-15 DIAGNOSIS — M6281 Muscle weakness (generalized): Secondary | ICD-10-CM | POA: Diagnosis not present

## 2022-10-15 DIAGNOSIS — R278 Other lack of coordination: Secondary | ICD-10-CM | POA: Diagnosis not present

## 2022-10-15 NOTE — Therapy (Signed)
OUTPATIENT PHYSICAL THERAPY NEURO TREATMENT   Patient Name: Virginia Luna MRN: [id number] DOB:1961/07/29, 62 y.o., female Today's Date: 10/15/2022   PCP: Shirline Frees, MD REFERRING PROVIDER: Courtney Heys, MD  END OF SESSION:  PT End of Session - 10/15/22 1106     Visit Number 6    Number of Visits 9    Date for PT Re-Evaluation 11/09/22   pushed out due to scheduling delay   Authorization Type [employer] MEDICARE    Authorization Time Period 09/03/22-11/09/22    Progress Note Due on Visit 10    PT Start Time 1102    PT Stop Time 1145    PT Time Calculation (min) 43 min    Equipment Utilized During Treatment Gait belt    Activity Tolerance Patient tolerated treatment well    Behavior During Therapy WFL for tasks assessed/performed              Past Medical History:  Diagnosis Date   Allergies    Anemia    Arthritis    Asthma    Back pain    Chronic pain    Complication of anesthesia    woke up during colonoscopy   Diabetes ([place])    Diabetes mellitus without complication (HCC)    Edema, lower extremity    Fibroid    Fibromyalgia    Chronic   GERD (gastroesophageal reflux disease)    Headache    migraines   Hepatic steatosis    High blood pressure    History of hiatal hernia    History of kidney stones    History of stomach ulcers    IBS (irritable bowel syndrome)    Joint pain    Sleep apnea    did use cpap-lost 25lb-says she does not need it   NO CPAP   Wears contact lenses    Past Surgical History:  Procedure Laterality Date   BRAIN SURGERY Left 11/07/2021   [facility] Dr. Herschel [place]   BREAST BIOPSY     BREAST EXCISIONAL BIOPSY     BREAST LUMPECTOMY WITH RADIOACTIVE SEED LOCALIZATION Bilateral 11/24/2014   Procedure: BILATERAL BREAST LUMPECTOMY WITH RADIOACTIVE SEED LOCALIZATION;  Surgeon: [name], MD;  Location: [place];  Service: General;  Laterality: Bilateral;   CHOLECYSTECTOMY     COLONOSCOPY     DILATION AND  CURETTAGE OF UTERUS     ESOPHAGOGASTRODUODENOSCOPY (EGD) WITH PROPOFOL N/A 07/23/2016   Procedure: ESOPHAGOGASTRODUODENOSCOPY (EGD) WITH PROPOFOL;  Surgeon: Laurence Spates, MD;  [facility];  Service: Endoscopy;  Laterality: N/A;   ESOPHAGOGASTRODUODENOSCOPY (EGD) WITH PROPOFOL N/A 06/11/2018   Procedure: ESOPHAGOGASTRODUODENOSCOPY (EGD) WITH PROPOFOL;  Surgeon: Laurence Spates, MD;  [facility];  Service: Endoscopy;  Laterality: N/A;   LUMBAR LAMINECTOMY  2010   ORIF WRIST FRACTURE Right 11/24/2020   Procedure: OPEN REDUCTION INTERNAL FIXATION RIGHT WRIST FRACTURE;  Surgeon: Renette Butters, MD;  [facility];  Service: Orthopedics;  Laterality: Right;   OVARIAN CYST SURGERY     UMBILICAL HERNIA REPAIR     age 23   UPPER GI ENDOSCOPY     Patient Active Problem List   Diagnosis Date Noted   Cerebellar tumor ([place]) 11/15/2021   Comorbid sleep-related hypoventilation 10/19/2021   Urinary incontinence/areflexic bladder 10/05/2021   OSA, unable to tolerate CPAP (vomited every night) 09/05/2021   Chronic pain syndrome 09/05/2021   Chronic nausea 09/05/2021   Neurogenic bladder, followed by Urology, instructed I&O cath BID 09/05/2021   Intolerance of  continuous positive airway pressure (CPAP) ventilation 09/03/2021   Insomnia secondary to chronic pain 07/24/2021   Primary osteoarthritis of right knee 07/03/2021   Myofascial pain dysfunction syndrome 07/03/2021   Fibromyalgia 07/03/2021   Hyponatremia 07/03/2021   Primary osteoarthritis of left knee 07/03/2021   Body mass index (BMI) 50.0-59.9, adult ([place]) 03/07/2021   Allergic rhinitis due to animal (cat) (dog) hair and dander 10/06/2020   Allergic rhinitis due to pollen 10/06/2020   Moderate persistent asthma, uncomplicated 123456   Cerebellar mass 10/06/2020   Paresthesia 05/27/2020   Diabetes mellitus ([place]) 05/25/2020   Hypertension associated with diabetes ([place]) 05/25/2020   Hyperlipidemia associated  with type 2 diabetes mellitus ([place]) 05/25/2020   Vitamin D deficiency 05/25/2020   NAFLD (nonalcoholic fatty liver disease) 05/25/2020   Sciatica of left side 05/25/2020    ONSET DATE: 08/24/2022 (referral date-ongoing since March 2023)  REFERRING DIAG: D49.6 (ICD-10-CM) - Cerebellar tumor ([place])  THERAPY DIAG:  Other abnormalities of gait and mobility  Unsteadiness on feet  Muscle weakness (generalized)  History of falling  Other lack of coordination  Rationale for Evaluation and Treatment: Rehabilitation  SUBJECTIVE:                                                                                                                                                                                             SUBJECTIVE STATEMENT: Pt denies falls or recent near falls, but inquires about ongoing shakiness and if this is permanent.   Pt accompanied by: self  PERTINENT HISTORY: DM2, HTN, left sciatica, fibromyalgia, asthma, and hyperlipidemia  From H&P note on 11/15/2021:  "On the day of admission 11/07/2021, the patient was taken to the operating room by Dr. Recardo Evangelist of neurosurgery and underwent left suboccipital craniotomy, resection of cerebellar tumor. She tolerated the procedure well. Postoperative MRI performed on 3/22 revealed a small area of diffusion restriction in the left cerebellum."  Patient transferred to CIR on 11/15/2021, discharged 11/24/2021 per pt.    Patient completed PT here 4/11-9/1 and a second episode at [facility] for same from November to December 2023.  PAIN:  Are you having pain? Yes: NPRS scale: 8/10 Pain location: RLE, right kidney Pain description: "I can't describe it" Aggravating factors: standing up Relieving factors: unsure  PRECAUTIONS: Fall  WEIGHT BEARING RESTRICTIONS: No  FALLS: Has patient fallen in last 6 months? Yes. Number of falls 1-"I don't really remember what happened"  She is unsure how long ago this happened.  She fell on the right  side, right thigh hurts.  OBJECTIVE:  LUE VITALS: Today's Vitals   10/15/22 1104  BP: (!) 135/95  Pulse: 88   TODAY'S TREATMENT:                                                                                                                              NMR: -Step-over 2-4" hurdle cross jabs to boxing bag, 2 rounds -Large and small therastone obstacle course progressing from BUE to alternating unilateral UE support 4x8', cues for decreased forearm reliance and forward trunk lean  THEREX: -Standing scapular retractions w/ green theraband x12, cued to prevent upper trap compensation -Standing lat pulldowns w/ red theraband 2x15, cues to prevent tricep compensation -Standing cervical retractions x15 w/ 2 second hold each, return demonstration -SciFit in hill mode at level 6.0 x 6 minutes w/ BUE/BLE for dynamic large amplitude reciprocal mobility and cardiovascular endurance w/ HIIT style workout.  PATIENT EDUCATION: Education details:  Resume orders and rescheduling visit after 3/5 next week.  Continue HEP focusing on STS as pt states she has had to rock to get up more lately. Person educated: Patient Education method: Explanation Education comprehension: verbalized understanding and needs further education  HOME EXERCISE PROGRAM:  Access Code: [email or web address] URL: [email or web address] Date: 09/17/2022 Prepared by: Elease Etienne  Exercises - Sit to Stand Without Arm Support  - 1 x daily - 7 x weekly - 3 sets - 10 reps - Chin Tuck  - 1 x daily - 7 x weekly - 3 sets - 10 reps - Seated Cervical Sidebending Stretch  - 1 x daily - 7 x weekly - 3 sets - 10 reps - Supine Bridge  - 1 x daily - 4 x weekly - 3 sets - 10 reps - Forward and Backward Monster Walk with Counter Support  - 1 x daily - 4 x weekly - 3 sets - 10 reps - Seated Heel Raise  - 1 x daily - 4 x weekly - 2 sets - 12 reps - Single Leg Stance on Pillow  - 1 x daily - 4 x weekly - 1 sets - 4 reps  GOALS: Goals  reviewed with patient? Yes  SHORT TERM GOALS: Target date: 10/05/2022  Pt will be compliant and independent with advanced strength and balance HEP in order to maintain progress and promote carryover to home. Baseline:  Reviewed and modified, pt compliant (2/12) Goal status: MET  2.  Pt will decrease 5xSTS to </=15 seconds w/ BUE support in order to demonstrate decreased risk for falls and improved functional bilateral LE strength and power. Baseline: 18.72 sec w/ BUE support; 17.25 seconds w/ BUE support (2/12) Goal status: IN PROGRESS  3.  Pt will demonstrate TUG of </=13 seconds using LRAD in order to decrease risk of falls and improve functional mobility. Baseline: 14.22 seconds w/ rollator; 12.56 seconds w/ rollator (2/12) Goal status: MET  4.  Pt will demonstrate a gait speed of >/=3.0 feet/sec using LRAD in order to decrease risk for falls. Baseline: 2.79 ft/sec w/ rollator; 2.83 ft/sec w/ rollator (2/12) Goal status:  IN PROGRESS  5.  Pt will increase BERG balance score to >/=41/56 to demonstrate improved static balance. Baseline: 37/56; 43/56 (2/12) Goal status: MET  6.  FGA to be assessed at STG goal deadline w/ LTG set as appropriate. Baseline: 11/30 (2/12) Goal status: MET  LONG TERM GOALS: Target date: 11/02/2022  Pt to be compliant to formal walking program or alternative aerobic exercise to maintain tolerance and ambulatory balance. Baseline: To be established. Goal status: INITIAL  2.  Pt will decrease 5xSTS to </=13 seconds w/o BUE support in order to demonstrate decreased risk for falls and improved functional bilateral LE strength and power. Baseline: 18.72 seconds w/ BUE support Goal status: INITIAL  3.  Pt will demonstrate a gait speed of >/=3.3 feet/sec using LRAD in order to decrease risk for falls. Baseline: 2.79 ft/sec w/ rollator Goal status: INITIAL  4.  Pt will improve FGA score to >/=19/30 in order to demonstrate improved balance and decreased fall  risk. Baseline: 11/30 (2/12) Goal status: INITIAL  ASSESSMENT:  CLINICAL IMPRESSION: Continued work on dynamic stability with increased LE challenge during boxing task today.  Ended session with focus on periscapular stability in standing to support postural control and strength.  She continues to have limited activity tolerance, but is making good progress with unsupported balance.  She tolerates all challenges well and continues to benefit from skilled PT to promote functional independence.  OBJECTIVE IMPAIRMENTS: Abnormal gait, decreased balance, decreased coordination, decreased endurance, decreased strength, increased edema, improper body mechanics, postural dysfunction, obesity, and pain.   ACTIVITY LIMITATIONS: carrying, lifting, squatting, stairs, transfers, and locomotion level  PARTICIPATION LIMITATIONS: meal prep, cleaning, driving, shopping, and community activity  PERSONAL FACTORS: Age, Fitness, Past/current experiences, Sex, Social background, Time since onset of injury/illness/exacerbation, and 1-2 comorbidities: fibromyalgia and HTN  are also affecting patient's functional outcome.   REHAB POTENTIAL: Good  CLINICAL DECISION MAKING: Stable/uncomplicated  EVALUATION COMPLEXITY: Low  PLAN:  PT FREQUENCY: 1x/week  PT DURATION: 8 weeks  PLANNED INTERVENTIONS: Therapeutic exercises, Therapeutic activity, Neuromuscular re-education, Balance training, Gait training, Patient/Family education, Self Care, Joint mobilization, Stair training, Vestibular training, DME instructions, Manual therapy, and Re-evaluation  PLAN FOR NEXT SESSION: Check L BP!   general balance/LE strength/endurance, review stair safety, work on neck and postural stability  Bary Richard, PT, DPT 10/15/2022, 1:43 PM

## 2022-10-16 ENCOUNTER — Ambulatory Visit (HOSPITAL_COMMUNITY)
Admission: RE | Admit: 2022-10-16 | Discharge: 2022-10-16 | Disposition: A | Discharge status: Home / Self Care | Payer: Medicare HMO | Source: Ambulatory Visit | Attending: Urology | Admitting: Urology

## 2022-10-16 ENCOUNTER — Other Ambulatory Visit: Payer: Self-pay

## 2022-10-16 ENCOUNTER — Ambulatory Visit (HOSPITAL_BASED_OUTPATIENT_CLINIC_OR_DEPARTMENT_OTHER): Payer: Medicare HMO | Admitting: Anesthesiology

## 2022-10-16 ENCOUNTER — Encounter (HOSPITAL_COMMUNITY): Payer: Self-pay | Admitting: Urology

## 2022-10-16 ENCOUNTER — Encounter (HOSPITAL_COMMUNITY)
Admission: RE | Disposition: A | Discharge status: Home / Self Care | Payer: Self-pay | Source: Ambulatory Visit | Attending: Urology

## 2022-10-16 ENCOUNTER — Ambulatory Visit (HOSPITAL_COMMUNITY): Payer: Medicare HMO | Admitting: Anesthesiology

## 2022-10-16 ENCOUNTER — Ambulatory Visit (HOSPITAL_COMMUNITY): Payer: Medicare HMO

## 2022-10-16 DIAGNOSIS — Z6841 Body Mass Index (BMI) 40.0 and over, adult: Secondary | ICD-10-CM | POA: Insufficient documentation

## 2022-10-16 DIAGNOSIS — M1990 Unspecified osteoarthritis, unspecified site: Secondary | ICD-10-CM | POA: Insufficient documentation

## 2022-10-16 DIAGNOSIS — Z8249 Family history of ischemic heart disease and other diseases of the circulatory system: Secondary | ICD-10-CM | POA: Diagnosis not present

## 2022-10-16 DIAGNOSIS — D649 Anemia, unspecified: Secondary | ICD-10-CM

## 2022-10-16 DIAGNOSIS — M797 Fibromyalgia: Secondary | ICD-10-CM | POA: Diagnosis not present

## 2022-10-16 DIAGNOSIS — G4733 Obstructive sleep apnea (adult) (pediatric): Secondary | ICD-10-CM | POA: Diagnosis not present

## 2022-10-16 DIAGNOSIS — G4730 Sleep apnea, unspecified: Secondary | ICD-10-CM | POA: Diagnosis not present

## 2022-10-16 DIAGNOSIS — I10 Essential (primary) hypertension: Secondary | ICD-10-CM

## 2022-10-16 DIAGNOSIS — Z7985 Long-term (current) use of injectable non-insulin antidiabetic drugs: Secondary | ICD-10-CM | POA: Insufficient documentation

## 2022-10-16 DIAGNOSIS — K449 Diaphragmatic hernia without obstruction or gangrene: Secondary | ICD-10-CM | POA: Insufficient documentation

## 2022-10-16 DIAGNOSIS — J45909 Unspecified asthma, uncomplicated: Secondary | ICD-10-CM | POA: Diagnosis not present

## 2022-10-16 DIAGNOSIS — N200 Calculus of kidney: Secondary | ICD-10-CM | POA: Insufficient documentation

## 2022-10-16 DIAGNOSIS — Z79899 Other long term (current) drug therapy: Secondary | ICD-10-CM | POA: Diagnosis not present

## 2022-10-16 DIAGNOSIS — G709 Myoneural disorder, unspecified: Secondary | ICD-10-CM | POA: Diagnosis not present

## 2022-10-16 DIAGNOSIS — E119 Type 2 diabetes mellitus without complications: Secondary | ICD-10-CM | POA: Insufficient documentation

## 2022-10-16 DIAGNOSIS — Z9989 Dependence on other enabling machines and devices: Secondary | ICD-10-CM

## 2022-10-16 DIAGNOSIS — K219 Gastro-esophageal reflux disease without esophagitis: Secondary | ICD-10-CM | POA: Insufficient documentation

## 2022-10-16 DIAGNOSIS — K769 Liver disease, unspecified: Secondary | ICD-10-CM

## 2022-10-16 HISTORY — PX: CYSTOSCOPY WITH RETROGRADE PYELOGRAM, URETEROSCOPY AND STENT PLACEMENT: SHX5789

## 2022-10-16 HISTORY — PX: HOLMIUM LASER APPLICATION: SHX5852

## 2022-10-16 LAB — GLUCOSE, CAPILLARY: Glucose-Capillary: 110 mg/dL — ABNORMAL HIGH (ref 70–99)

## 2022-10-16 SURGERY — CYSTOURETEROSCOPY, WITH RETROGRADE PYELOGRAM AND STENT INSERTION
Anesthesia: General | Site: Ureter | Laterality: Right

## 2022-10-16 MED ORDER — LIDOCAINE HCL (CARDIAC) PF 100 MG/5ML IV SOSY
PREFILLED_SYRINGE | INTRAVENOUS | Status: DC | PRN
  Administered 2022-10-16: 60 mg via INTRAVENOUS

## 2022-10-16 MED ORDER — CHLORHEXIDINE GLUCONATE 0.12 % MT SOLN
15.0000 mL | Freq: Once | OROMUCOSAL | Status: AC
  Administered 2022-10-16: 15 mL via OROMUCOSAL

## 2022-10-16 MED ORDER — CIPROFLOXACIN HCL 500 MG PO TABS: 500.0000 mg | ORAL_TABLET | Freq: Once | ORAL | 0 refills | Status: AC

## 2022-10-16 MED ORDER — LIDOCAINE HCL (PF) 2 % IJ SOLN
INTRAMUSCULAR | Status: AC
  Filled 2022-10-16: qty 5

## 2022-10-16 MED ORDER — PROPOFOL 10 MG/ML IV BOLUS
INTRAVENOUS | Status: AC
  Filled 2022-10-16: qty 20

## 2022-10-16 MED ORDER — TAMSULOSIN HCL 0.4 MG PO CAPS: 0.4000 mg | ORAL_CAPSULE | Freq: Every day | ORAL | 0 refills | Status: DC

## 2022-10-16 MED ORDER — PROPOFOL 10 MG/ML IV BOLUS
INTRAVENOUS | Status: DC | PRN
  Administered 2022-10-16: 200 mg via INTRAVENOUS

## 2022-10-16 MED ORDER — KETOROLAC TROMETHAMINE 30 MG/ML IJ SOLN: 30.0000 mg | Freq: Once | INTRAMUSCULAR | Status: DC | PRN

## 2022-10-16 MED ORDER — PROMETHAZINE HCL 25 MG/ML IJ SOLN: 6.2500 mg | INTRAMUSCULAR | Status: DC | PRN

## 2022-10-16 MED ORDER — FENTANYL CITRATE PF 50 MCG/ML IJ SOSY: 25.0000 ug | PREFILLED_SYRINGE | INTRAMUSCULAR | Status: DC | PRN

## 2022-10-16 MED ORDER — 0.9 % SODIUM CHLORIDE (POUR BTL) OPTIME
TOPICAL | Status: DC | PRN
  Administered 2022-10-16: 1000 mL

## 2022-10-16 MED ORDER — OXYCODONE HCL 5 MG/5ML PO SOLN: 5.0000 mg | Freq: Once | ORAL | Status: DC | PRN

## 2022-10-16 MED ORDER — CIPROFLOXACIN IN D5W 400 MG/200ML IV SOLN
400.0000 mg | INTRAVENOUS | Status: AC
  Administered 2022-10-16: 400 mg via INTRAVENOUS
  Filled 2022-10-16: qty 200

## 2022-10-16 MED ORDER — AMISULPRIDE (ANTIEMETIC) 5 MG/2ML IV SOLN: 10.0000 mg | Freq: Once | INTRAVENOUS | Status: DC | PRN

## 2022-10-16 MED ORDER — FENTANYL CITRATE (PF) 100 MCG/2ML IJ SOLN
INTRAMUSCULAR | Status: AC
  Filled 2022-10-16: qty 2

## 2022-10-16 MED ORDER — ACETAMINOPHEN 500 MG PO TABS
1000.0000 mg | ORAL_TABLET | Freq: Once | ORAL | Status: AC
  Administered 2022-10-16: 1000 mg via ORAL
  Filled 2022-10-16: qty 2

## 2022-10-16 MED ORDER — SODIUM CHLORIDE 0.9 % IR SOLN
Status: DC | PRN
  Administered 2022-10-16: 3000 mL

## 2022-10-16 MED ORDER — IOHEXOL 300 MG/ML  SOLN
INTRAMUSCULAR | Status: DC | PRN
  Administered 2022-10-16: 8 mL

## 2022-10-16 MED ORDER — OXYBUTYNIN CHLORIDE 5 MG PO TABS: 5.0000 mg | ORAL_TABLET | Freq: Three times a day (TID) | ORAL | 0 refills | Status: DC | PRN

## 2022-10-16 MED ORDER — ORAL CARE MOUTH RINSE: 15.0000 mL | Freq: Once | OROMUCOSAL | Status: AC

## 2022-10-16 MED ORDER — LACTATED RINGERS IV SOLN: INTRAVENOUS | Status: DC

## 2022-10-16 MED ORDER — OXYCODONE HCL 5 MG PO TABS: 5.0000 mg | ORAL_TABLET | Freq: Once | ORAL | Status: DC | PRN

## 2022-10-16 MED ORDER — MIDAZOLAM HCL 2 MG/2ML IJ SOLN
INTRAMUSCULAR | Status: AC
  Filled 2022-10-16: qty 2

## 2022-10-16 MED ORDER — ONDANSETRON HCL 4 MG/2ML IJ SOLN
INTRAMUSCULAR | Status: AC
  Filled 2022-10-16: qty 2

## 2022-10-16 MED ORDER — FENTANYL CITRATE (PF) 100 MCG/2ML IJ SOLN
INTRAMUSCULAR | Status: DC | PRN
  Administered 2022-10-16 (×2): 50 ug via INTRAVENOUS

## 2022-10-16 MED ORDER — ONDANSETRON HCL 4 MG/2ML IJ SOLN
INTRAMUSCULAR | Status: DC | PRN
  Administered 2022-10-16: 4 mg via INTRAVENOUS

## 2022-10-16 MED ORDER — MIDAZOLAM HCL 5 MG/5ML IJ SOLN
INTRAMUSCULAR | Status: DC | PRN
  Administered 2022-10-16: 2 mg via INTRAVENOUS

## 2022-10-16 SURGICAL SUPPLY — 24 items
BAG URO CATCHER STRL LF (MISCELLANEOUS) ×3 IMPLANT
BASKET ZERO TIP NITINOL 2.4FR (BASKET) IMPLANT
BSKT STON RTRVL ZERO TP 2.4FR (BASKET) ×2
CATH URETL OPEN 5X70 (CATHETERS) ×3 IMPLANT
CLOTH BEACON ORANGE TIMEOUT ST (SAFETY) ×3 IMPLANT
DRSG TEGADERM 2-3/8X2-3/4 SM (GAUZE/BANDAGES/DRESSINGS) IMPLANT
EXTRACTOR STONE 1.7FRX115CM (UROLOGICAL SUPPLIES) IMPLANT
FIBER LASER MOSES 200 DFL (Laser) IMPLANT
FIBER LASER MOSES 365 DFL (Laser) IMPLANT
GLOVE BIO SURGEON STRL SZ 6.5 (GLOVE) ×3 IMPLANT
GOWN STRL REUS W/ TWL LRG LVL3 (GOWN DISPOSABLE) ×3 IMPLANT
GOWN STRL REUS W/TWL LRG LVL3 (GOWN DISPOSABLE) ×2
GUIDEWIRE STR DUAL SENSOR (WIRE) ×3 IMPLANT
KIT TURNOVER KIT A (KITS) IMPLANT
LASER FIB FLEXIVA PULSE ID 365 (Laser) IMPLANT
MANIFOLD NEPTUNE II (INSTRUMENTS) ×3 IMPLANT
PACK CYSTO (CUSTOM PROCEDURE TRAY) ×3 IMPLANT
SHEATH NAVIGATOR HD 11/13X28 (SHEATH) IMPLANT
SHEATH NAVIGATOR HD 11/13X36 (SHEATH) IMPLANT
STENT URET 6FRX24 CONTOUR (STENTS) IMPLANT
TRACTIP FLEXIVA PULS ID 200XHI (Laser) IMPLANT
TRACTIP FLEXIVA PULSE ID 200 (Laser)
TUBING CONNECTING 10 (TUBING) ×3 IMPLANT
TUBING UROLOGY SET (TUBING) ×3 IMPLANT

## 2022-10-16 NOTE — Transfer of Care (Signed)
Immediate Anesthesia Transfer of Care Note  Patient: Virginia Luna  Procedure(s) Performed: CYSTOSCOPY WITH RETROGRADE PYELOGRAM, URETEROSCOPY AND STENT PLACEMENT (Right: Ureter) HOLMIUM LASER APPLICATION (Right)  Patient Location: PACU  Anesthesia Type:General  Level of Consciousness: awake, alert , and patient cooperative  Airway & Oxygen Therapy: Patient Spontanous Breathing and Patient connected to nasal cannula oxygen  Post-op Assessment: Report given to RN, Post -op Vital signs reviewed and stable, and Patient moving all extremities X 4  Post vital signs: Reviewed and stable  Last Vitals:  Vitals Value Taken Time  BP 133/72   Temp    Pulse 83 10/16/22 1057  Resp 13 10/16/22 1057  SpO2 100 % 10/16/22 1057  Vitals shown include unvalidated device data.  Last Pain:  Vitals:   10/16/22 0847  TempSrc:   PainSc: 9       Patients Stated Pain Goal: 3 ([id number])  Complications: No notable events documented.

## 2022-10-16 NOTE — Op Note (Signed)
Preoperative diagnosis: right renal calculus  Postoperative diagnosis: right renal calculus  Procedure:  Cystoscopy right ureteroscopy, laser lithotripsy, basket stone extraction right 65F x 24cm ureteral stent placement - tether right retrograde pyelography with interpretation  Surgeon: Jacalyn Lefevre, MD  Anesthesia: General  Complications: None  Intraoperative findings:  Normal urethra Bilateral orthotropic ureteral orifices right retrograde pyelography demonstrated a filling defect within the right pelvis consistent with the patient's known calculus without other abnormalities. Bladder mucosa normal without masses   EBL: Minimal  Specimens: right renal calculus - given to patient  Disposition of specimens: [facility] for stone analysis  Indication: Virginia Luna is a 63 y.o.   patient with a 58m right renal pelvis stone and associated right symptoms. After reviewing the management options for treatment, the patient elected to proceed with the above surgical procedure(s). We have discussed the potential benefits and risks of the procedure, side effects of the proposed treatment, the likelihood of the patient achieving the goals of the procedure, and any potential problems that might occur during the procedure or recuperation. Informed consent has been obtained.   Description of procedure:  The patient was taken to the operating room and general anesthesia was induced.  The patient was placed in the dorsal lithotomy position, prepped and draped in the usual sterile fashion, and preoperative antibiotics were administered. A preoperative time-out was performed.   Cystourethroscopy was performed.  The patient's urethra was examined and was normal.  The bladder was then systematically examined in its entirety. There was no evidence for any bladder tumors, stones, or other mucosal pathology.    Attention then turned to the right ureteral orifice and a  ureteral catheter was used to intubate the ureteral orifice.  Omnipaque contrast was injected through the ureteral catheter and a retrograde pyelogram was performed with findings as dictated above.  A 0.38 sensor guidewire was then advanced up the right ureter into the renal pelvis under fluoroscopic guidance.  A second sensor was placed alongside the first sensor wire.  1 wire was secured as a safety wire.  A ureteral access sheath was then advanced over the second wire into the proximal ureter with fluoroscopic guidance.  The inner sheath and wire removed.  Flexible ureteroscopy then took place.  The known renal pelvis stone was encountered upon pyeloscopy. The stone was then dusted with the 2042mron holmium laser fiber on a setting of 0.3 and '60Hz'$ .   A few smaller fragments were then removed from the renal pelvis with a 0 tip basket and so they could be analyzed.  All remaining stone fragments were small and not amenable to basketing.  Reinspection of the ureter revealed no remaining visible stones or fragments.  The ureteral access sheath was removed and unison with the ureteroscope.  The wire was then backloaded through the cystoscope and a ureteral stent was advance over the wire using Seldinger technique.  The stent was positioned appropriately under fluoroscopic and cystoscopic guidance.  The wire was then removed with an adequate stent curl noted in the renal pelvis as well as in the bladder.  The bladder was then emptied and the procedure ended.  The patient appeared to tolerate the procedure well and without complications.  The patient was able to be awakened and transferred to the recovery unit in satisfactory condition.   Disposition: The tether of the stent was left on and tucked inside the patient's vagina.  Instructions for removing the stent have been provided to the patient.

## 2022-10-16 NOTE — Anesthesia Procedure Notes (Signed)
Procedure Name: LMA Insertion Date/Time: 10/16/2022 10:03 AM  Performed by: Jonna Munro, CRNAPre-anesthesia Checklist: Patient identified, Emergency Drugs available, Suction available, Patient being monitored and Timeout performed Patient Re-evaluated:Patient Re-evaluated prior to induction Oxygen Delivery Method: Circle system utilized Preoxygenation: Pre-oxygenation with 100% oxygen Induction Type: IV induction LMA: LMA with gastric port inserted LMA Size: 4.0 Number of attempts: 1 Tube secured with: Tape Dental Injury: Teeth and Oropharynx as per pre-operative assessment

## 2022-10-16 NOTE — Discharge Instructions (Addendum)
DISCHARGE INSTRUCTIONS FOR KIDNEY STONE/URETERAL STENT   MEDICATIONS:  1. Resume all your other meds from home  2. AZO over the counter can help with the burning/stinging when you urinate. 3. Oxycodone is for moderate/severe pain, otherwise taking up to 1000 mg every 6 hours of plainTylenol will help treat your pain.   4. Take Cipro one hour prior to removal of your stent.    ACTIVITY:  1. No strenuous activity x 1week  2. No driving while on narcotic pain medications  3. Drink plenty of water  4. Continue to walk at home - you can still get blood clots when you are at home, so keep active, but don't over do it.  5. May return to work/school tomorrow or when you feel ready   BATHING:  1. You can shower and we recommend daily showers  2. You have a string coming from your urethra: The stent string is attached to your ureteral stent. Do not pull on this.   SIGNS/SYMPTOMS TO CALL:  Please call us if you have a fever greater than 101.5, uncontrolled nausea/vomiting, uncontrolled pain, dizziness, unable to urinate, bloody urine, chest pain, shortness of breath, leg swelling, leg pain, redness around wound, drainage from wound, or any other concerns or questions.   You can reach [name] at [phone].   FOLLOW-UP:  1. You have a string attached to your stent, you may remove it on Tuesday, March 5th. To do this, pull the string until the stent is completely removed. You may feel an odd sensation in your back.

## 2022-10-16 NOTE — Anesthesia Postprocedure Evaluation (Signed)
Anesthesia Post Note  Patient: Virginia Luna  Procedure(s) Performed: CYSTOSCOPY WITH RETROGRADE PYELOGRAM, URETEROSCOPY AND STENT PLACEMENT (Right: Ureter) HOLMIUM LASER APPLICATION (Right)     Patient location during evaluation: PACU Anesthesia Type: General Level of consciousness: awake Pain management: pain level controlled Vital Signs Assessment: post-procedure vital signs reviewed and stable Respiratory status: spontaneous breathing, nonlabored ventilation and respiratory function stable Cardiovascular status: blood pressure returned to baseline and stable Postop Assessment: no apparent nausea or vomiting Anesthetic complications: no   No notable events documented.  Last Vitals:  Vitals:   10/16/22 1115 10/16/22 1128  BP: (!) 151/80 (!) 149/77  Pulse: 80 82  Resp: 12 12  Temp:  36.6 C  SpO2: 99% 94%    Last Pain:  Vitals:   10/16/22 1128  TempSrc:   PainSc: 0-No pain                 Ellington Greenslade P Bassy Fetterly

## 2022-10-16 NOTE — Interval H&P Note (Signed)
History and Physical Interval Note:  10/16/2022 9:23 AM  Virginia Luna  has presented today for surgery, with the diagnosis of RENAL CALCULUS.  The various methods of treatment have been discussed with the patient and family. After consideration of risks, benefits and other options for treatment, the patient has consented to  Procedure(s): CYSTOSCOPY WITH RETROGRADE PYELOGRAM, URETEROSCOPY AND STENT PLACEMENT (Right) HOLMIUM LASER APPLICATION (Right) as a surgical intervention.  The patient's history has been reviewed, patient examined, no change in status, stable for surgery.  I have reviewed the patient's chart and labs.  Questions were answered to the patient's satisfaction.     [name] D Bettylou Frew

## 2022-10-16 NOTE — H&P (Signed)
CC/HPI: cc: hematuria, urolithiasis   08/10/21: 62 year old woman comes in with several year history of urinary incontinence. She reports urinary urge incontinence, incontinence without sensation and stress urinary incontinence. She also has nocturia 3-4 times a night. She drinks a significant amount of water during the day, herbal tea and decaffeinated soda. She also has fibromyalgia and oftentimes wakes up in pain which triggers her need to go to the bathroom. She had a urodynamic study many years ago and was told she had a large bladder capacity. She may only go to the bathroom 3 times during the course of the entire day.   UDS SUMMARY  Ms. Deforrest held a max capacity of approx. 440 mls. Her 1st sensation was felt at 274 mls. No SUI was noted. No instability was noted. She did not generate a voluntary contraction and void. She tried to crede' and strain but this did not help her empty her bladder. She did express that she does have some difficulty urinating on occasion. Mild trabeculation was noted. No reflux was noted. Her bladder was completely drained before she left the clinic.   09/04/2021: 62 year old woman with history of urinary continence, incontinence without sensation and stress incontinence as well as nocturia here for follow-up. Patient underwent a urodynamic study that showed a normal bladder capacity however her bladder was unable to generate voluntary contraction or void. She attempted to Crede to void which did not help. Based on patient's symptoms and urodynamic study I think she may be voiding on pop-off mechanism or overflow incontinence. Patient's also been undergoing work-up for decreased oxygen saturation overnight.   10/05/2021: 62 year old female who presents today for follow-up and PVR after starting to use CIC. She reports she does not use it every day. Her PVR today is 143 mL. She does feel that she is emptying better than she has been in the past. No complaints of fevers or  chills. She denies dysuria.   01/29/2022: 62 year old female who presents for follow-up. She is currently voiding well and denies any issues with urinary retention. She states she is going frequently and feels that she is fully emptying her bladder. She denies fevers, chills, dysuria. She denies flank pain. She recently underwent brain surgery. Since the surgery, she has not self cathed.   05/28/22: 62 year old female who presents today for follow-up regarding next incontinence. Since her brain surgery she has had some difficulty with her memory and does not always perform self-catheterization. She does report that she will perform it as needed. She is currently not feeling well and reports some urinary frequency and urgency although cannot recall when this began. Her urine does have signs of infection. Her PVR is 90 mL. She denies gross hematuria, fevers, chills.   08/17/2022: 62 year old woman with a history of urinary incontinence here for follow-up. Patient has had a urodynamic study that shows normal bladder capacity but inability to generate a voluntary contraction. Patient void spontaneously and also uses CIC when needed. She has not needed it lately except for 1 time in the evening. Her urine today looks better than last visit but still has microscopic hematuria. Patient denies any gross hematuria. She is experiencing right upper quadrant pain.   09/21/2022: 62 year old woman with a history of mixed urinary incontinence who performs CIC when needed most recently evaluated for microscopic hematuria and right flank pain. CT scan shows an 18 mm renal calculus with mild pelvic caliectasis.     ALLERGIES: Morphine And Related Morphine Hcl Prednisone Rocephin Sulfa Antibiotics  MEDICATIONS: Acetaminophen 500 mg tablet  Albuterol Sulfate  Ambien  Amlodipine Besylate  Atorvastatin Calcium  Breo Ellipta  Cyclobenzaprine Hcl  Diclofenac  Epinephrine  Flonase Allergy Relief  Furosemide   Gabapentin  Hydrocodone-Acetaminophen  Lidocaine 5 % ointment  Linzess  Meclizine Hcl  Melatonin  Methocarbamol  Miralax  Montelukast Sodium  Oxcarbazepine  Ozempic  Pantoprazole Sodium  Senna-Docusate Sodium  Seroquel  Vitamin D3  Zofran  Zonisamide     GU PSH: Complex cystometrogram, w/ void pressure and urethral pressure profile studies, any technique - 08/16/2021 Complex Uroflow - 08/16/2021 Emg surf Electrd - 08/16/2021 Inject For cystogram - 08/16/2021 Intrabd voidng Press - 08/16/2021 Locm 300-'399Mg'$ /Ml Iodine,1Ml - 09/12/2022       PSH Notes: fibroid removal   NON-GU PSH: Brain Surgery (Unspecified) - 11/07/2021 Breast Biopsy Breast lumpectomy Cholecystectomy (laparoscopic) Colonoscopy Hand/finger Surgery Laparoscopy, Surgical; Repair Umbilical Hernia Lumbar Laminectomy Upper GI Endoscopy     GU PMH: Microscopic hematuria - 09/12/2022, - 08/17/2022 Mixed incontinence - 08/17/2022, - 05/28/2022, - 09/04/2021, - 08/16/2021, - 08/10/2021 Acute Cystitis/UTI - 05/28/2022 Benign Neo Kidney, Unspec - 01/29/2022 Incomplete bladder emptying - 01/29/2022, - 10/05/2021 Nocturia - 09/04/2021, - 08/10/2021      PMH Notes: stomach ulcers   NON-GU PMH: Anemia, unspecified Arthritis Asthma Hypertension Other low back pain Other sleep apnea    FAMILY HISTORY: Breast Cancer - Aunt Diabetes - Father Hypertension - Father   SOCIAL HISTORY: Marital Status: Single Drinks 1 caffeinated drink per day.    REVIEW OF SYSTEMS:    GU Review Female:   Patient denies frequent urination, hard to postpone urination, burning /pain with urination, get up at night to urinate, leakage of urine, stream starts and stops, trouble starting your stream, have to strain to urinate, and being pregnant.  Gastrointestinal (Upper):   Patient denies nausea, vomiting, and indigestion/ heartburn.  Gastrointestinal (Lower):   Patient denies diarrhea and constipation.  Constitutional:   Patient  denies fever, night sweats, weight loss, and fatigue.  Skin:   Patient denies skin rash/ lesion and itching.  Eyes:   Patient denies blurred vision and double vision.  Ears/ Nose/ Throat:   Patient denies sore throat and sinus problems.  Hematologic/Lymphatic:   Patient denies swollen glands and easy bruising.  Cardiovascular:   Patient denies leg swelling and chest pains.  Respiratory:   Patient denies cough and shortness of breath.  Endocrine:   Patient denies excessive thirst.  Musculoskeletal:   Patient denies back pain and joint pain.  Neurological:   Patient denies headaches and dizziness.  Psychologic:   Patient denies depression and anxiety.   VITAL SIGNS:      09/21/2022 09:49 AM  BP 170/113 mmHg  Pulse 93 /min   MULTI-SYSTEM PHYSICAL EXAMINATION:    Constitutional: Well-nourished. No physical deformities. Normally developed. Good grooming.  Neck: Neck symmetrical, not swollen. Normal tracheal position.  Respiratory: No labored breathing, no use of accessory muscles.   Skin: No paleness, no jaundice, no cyanosis. No lesion, no ulcer, no rash.  Neurologic / Psychiatric: Oriented to time, oriented to place, oriented to person. No depression, no anxiety, no agitation.  Eyes: Normal conjunctivae. Normal eyelids.  Ears, Nose, Mouth, and Throat: Left ear no scars, no lesions, no masses. Right ear no scars, no lesions, no masses. Nose no scars, no lesions, no masses. Normal hearing. Normal lips.  Musculoskeletal: Normal gait and station of head and neck.     Complexity of Data:  Records Review:   Previous  Patient Records, POC Tool  Urine Test Review:   Urinalysis  X-Ray Review: C.T. Abdomen/Pelvis: Reviewed Films. Reviewed Report. Discussed With Patient.  IMPRESSION:  1. Mild right pelviectasis and lower pole caliectasis with a 18 mm  stone in the renal pelvis. No obstructive ureteral or bladder  calculi.  2. Punctate nonobstructive left renal calculi measure up to 2 mm.  3.  Benign 7 mm left angiomyolipoma requires no independent imaging  follow-up..  4. Possible asymmetric thickening of the rectal wall, recommend  correlation with direct visualization to exclude underlying lesion.  5. Enlarged uterus with lobular contour similar prior and compatible  with leiomyomas.  6. Small hiatal hernia.    Electronically Signed  By: Dahlia Bailiff M.D.  On: 09/12/2022 15:55    PROCEDURES:          Urinalysis w/Scope Dipstick Dipstick Cont'd Micro  Color: Yellow Bilirubin: Neg mg/dL WBC/hpf: 0 - 5/hpf  Appearance: Cloudy Ketones: Neg mg/dL RBC/hpf: 3 - 10/hpf  Specific Gravity: 1.015 Blood: 1+ ery/uL Bacteria: Rare (0-9/hpf)  pH: 7.5 Protein: Trace mg/dL Cystals: Amorph Phosphates  Glucose: Neg mg/dL Urobilinogen: 0.2 mg/dL Casts: Hyaline    Nitrites: Neg Trichomonas: Not Present    Leukocyte Esterase: Trace leu/uL Mucous: Present      Epithelial Cells: 0 - 5/hpf      Yeast: NS (Not Seen)      Sperm: Not Present    ASSESSMENT:      ICD-10 Details  1 GU:   Mixed incontinence - N39.46 Chronic, Stable  2   Microscopic hematuria - R31.21 Chronic, Stable  3   Renal calculus - N20.0 Chronic, Stable   PLAN:           Document Letter(s):  Created for Patient: Clinical Summary         Notes:   Urolithiasis:  -I had previously called patient with results of CT scan and gave her a printed copy of radiology report today  -We discussed management of right renal calculus and have decided to move forward with ureteroscopy with laser lithotripsy and stent placement. Risks and benefits of the procedure discussed in detail including went on to pain, bleeding, infection, damage to structures, urethral stricture, need for staged procedure. Patient understand these risks and wishes to proceed.  -Radiology findings: Patient with incidental finding of rectal wall thickening and will be referred to GI.   Schedule next available surgery

## 2022-10-16 NOTE — Anesthesia Preprocedure Evaluation (Addendum)
Anesthesia Evaluation  Patient identified by MRN, date of birth, ID band Patient awake    Reviewed: Allergy & Precautions, NPO status , Patient's Chart, lab work & pertinent test results  Airway Mallampati: III  TM Distance: >3 FB Neck ROM: Full    Dental no notable dental hx.    Pulmonary asthma , sleep apnea and Continuous Positive Airway Pressure Ventilation    Pulmonary exam normal        Cardiovascular hypertension, Pt. on medications Normal cardiovascular exam     Neuro/Psych  Headaches  Neuromuscular disease  negative psych ROS   GI/Hepatic Neg liver ROS, hiatal hernia,GERD  Medicated and Controlled,,  Endo/Other  diabetes  Morbid obesityPatient on GLP-1 Agonist. Last dose on Sunday 2/18  Renal/GU negative Renal ROS     Musculoskeletal  (+) Arthritis ,  Fibromyalgia -  Abdominal  (+) + obese  Peds  Hematology negative hematology ROS (+)   Anesthesia Other Findings RENAL CALCULUS  Reproductive/Obstetrics                              Anesthesia Physical Anesthesia Plan  ASA: 3  Anesthesia Plan: General   Post-op Pain Management:    Induction: Intravenous  PONV Risk Score and Plan: 4 or greater and Ondansetron, Dexamethasone, Midazolam and Treatment may vary due to age or medical condition  Airway Management Planned: LMA  Additional Equipment:   Intra-op Plan:   Post-operative Plan: Extubation in OR  Informed Consent: I have reviewed the patients History and Physical, chart, labs and discussed the procedure including the risks, benefits and alternatives for the proposed anesthesia with the patient or authorized representative who has indicated his/her understanding and acceptance.     Dental advisory given  Plan Discussed with: CRNA  Anesthesia Plan Comments:          Anesthesia Quick Evaluation

## 2022-10-17 ENCOUNTER — Encounter (HOSPITAL_COMMUNITY): Payer: Self-pay | Admitting: Urology

## 2022-10-17 ENCOUNTER — Other Ambulatory Visit: Payer: Self-pay | Admitting: Family Medicine

## 2022-10-17 DIAGNOSIS — Z1231 Encounter for screening mammogram for malignant neoplasm of breast: Secondary | ICD-10-CM

## 2022-10-18 ENCOUNTER — Telehealth: Payer: Self-pay

## 2022-10-18 DIAGNOSIS — J3089 Other allergic rhinitis: Secondary | ICD-10-CM | POA: Diagnosis not present

## 2022-10-18 DIAGNOSIS — J301 Allergic rhinitis due to pollen: Secondary | ICD-10-CM | POA: Diagnosis not present

## 2022-10-18 DIAGNOSIS — J3081 Allergic rhinitis due to animal (cat) (dog) hair and dander: Secondary | ICD-10-CM | POA: Diagnosis not present

## 2022-10-18 NOTE — Telephone Encounter (Signed)
Patient called in stating that Dr.Asher's office needs the last CT scan and MRI results faxed over to them, said that we should have that information to send over.

## 2022-10-18 NOTE — Telephone Encounter (Signed)
Advised patient to have Dr.Asher's office to send a medical release form to [facility] since both were do at the hospital and not ordered by [facility].

## 2022-10-22 ENCOUNTER — Ambulatory Visit: Payer: Medicare HMO | Admitting: Physical Therapy

## 2022-10-23 DIAGNOSIS — J3089 Other allergic rhinitis: Secondary | ICD-10-CM | POA: Diagnosis not present

## 2022-10-23 DIAGNOSIS — N200 Calculus of kidney: Secondary | ICD-10-CM | POA: Diagnosis not present

## 2022-10-23 DIAGNOSIS — R8271 Bacteriuria: Secondary | ICD-10-CM | POA: Diagnosis not present

## 2022-10-23 DIAGNOSIS — J3081 Allergic rhinitis due to animal (cat) (dog) hair and dander: Secondary | ICD-10-CM | POA: Diagnosis not present

## 2022-10-23 DIAGNOSIS — J301 Allergic rhinitis due to pollen: Secondary | ICD-10-CM | POA: Diagnosis not present

## 2022-10-24 ENCOUNTER — Ambulatory Visit: Payer: Medicare HMO | Admitting: Gastroenterology

## 2022-10-29 ENCOUNTER — Ambulatory Visit: Payer: Medicare HMO | Attending: Family Medicine | Admitting: Physical Therapy

## 2022-10-29 ENCOUNTER — Encounter: Payer: Self-pay | Admitting: Physical Therapy

## 2022-10-29 VITALS — BP 155/82

## 2022-10-29 DIAGNOSIS — R2689 Other abnormalities of gait and mobility: Secondary | ICD-10-CM | POA: Insufficient documentation

## 2022-10-29 DIAGNOSIS — Z9181 History of falling: Secondary | ICD-10-CM | POA: Diagnosis not present

## 2022-10-29 DIAGNOSIS — M6281 Muscle weakness (generalized): Secondary | ICD-10-CM | POA: Insufficient documentation

## 2022-10-29 DIAGNOSIS — R2681 Unsteadiness on feet: Secondary | ICD-10-CM | POA: Insufficient documentation

## 2022-10-29 DIAGNOSIS — R278 Other lack of coordination: Secondary | ICD-10-CM | POA: Insufficient documentation

## 2022-10-29 NOTE — Therapy (Signed)
OUTPATIENT PHYSICAL THERAPY NEURO TREATMENT   Patient Name: Virginia Luna MRN: [id number] DOB:1960/11/03, 62 y.o., female Today's Date: 10/29/2022   PCP: Virginia Frees, MD REFERRING PROVIDER: Courtney Heys, MD  END OF SESSION:  PT End of Session - 10/29/22 1107     Visit Number 7    Number of Visits 9    Date for PT Re-Evaluation 11/09/22   pushed out due to scheduling delay   Authorization Type [employer]ICARE    Authorization Time Period 09/03/22-11/09/22    Progress Note Due on Visit 10    PT Start Time 1104   pt late   PT Stop Time 1147    PT Time Calculation (min) 43 min    Equipment Utilized During Treatment Gait belt    Activity Tolerance Patient tolerated treatment well    Behavior During Therapy WFL for tasks assessed/performed              Past Medical History:  Diagnosis Date   Allergies    Anemia    Arthritis    Asthma    Back pain    Chronic pain    Complication of anesthesia    woke up during colonoscopy   Diabetes ([place])    Diabetes mellitus without complication (HCC)    Edema, lower extremity    Fibroid    Fibromyalgia    Chronic   GERD (gastroesophageal reflux disease)    Headache    migraines   Hepatic steatosis    High blood pressure    History of hiatal hernia    History of kidney stones    History of stomach ulcers    IBS (irritable bowel syndrome)    Joint pain    Sleep apnea    did use cpap-lost 25lb-says she does not need it   NO CPAP   Wears contact lenses    Past Surgical History:  Procedure Laterality Date   BRAIN SURGERY Left 11/07/2021   [facility] Dr. Herschel Luna   BREAST BIOPSY     BREAST EXCISIONAL BIOPSY     BREAST LUMPECTOMY WITH RADIOACTIVE SEED LOCALIZATION Bilateral 11/24/2014   Procedure: BILATERAL BREAST LUMPECTOMY WITH RADIOACTIVE SEED LOCALIZATION;  Surgeon: Virginia Luna, MD;  Location: [facility];  Service: General;  Laterality: Bilateral;   CHOLECYSTECTOMY     COLONOSCOPY      CYSTOSCOPY WITH RETROGRADE PYELOGRAM, URETEROSCOPY AND STENT PLACEMENT Right 10/16/2022   Procedure: CYSTOSCOPY WITH RETROGRADE PYELOGRAM, URETEROSCOPY AND STENT PLACEMENT;  Surgeon: Virginia Fries, MD;  Location: [facility];  Service: Urology;  Laterality: Right;   DILATION AND CURETTAGE OF UTERUS     ESOPHAGOGASTRODUODENOSCOPY (EGD) WITH PROPOFOL N/A 07/23/2016   Procedure: ESOPHAGOGASTRODUODENOSCOPY (EGD) WITH PROPOFOL;  Surgeon: Virginia Spates, MD;  Location: [facility];  Service: Endoscopy;  Laterality: N/A;   ESOPHAGOGASTRODUODENOSCOPY (EGD) WITH PROPOFOL N/A 06/11/2018   Procedure: ESOPHAGOGASTRODUODENOSCOPY (EGD) WITH PROPOFOL;  Surgeon: Virginia Spates, MD;  Location: [facility];  Service: Endoscopy;  Laterality: N/A;   HOLMIUM LASER APPLICATION Right [id number]   Procedure: HOLMIUM LASER APPLICATION;  Surgeon: Virginia Fries, MD;  Location: [facility];  Service: Urology;  Laterality: Right;   LUMBAR LAMINECTOMY  2010   ORIF WRIST FRACTURE Right 11/24/2020   Procedure: OPEN REDUCTION INTERNAL FIXATION RIGHT WRIST FRACTURE;  Surgeon: Virginia Butters, MD;  Location: [facility];  Service: Orthopedics;  Laterality: Right;   OVARIAN CYST SURGERY     UMBILICAL HERNIA REPAIR     age 86   UPPER  GI ENDOSCOPY     Patient Active Problem List   Diagnosis Date Noted   Cerebellar tumor ([place]) 11/15/2021   Comorbid sleep-related hypoventilation 10/19/2021   Urinary incontinence/areflexic bladder 10/05/2021   OSA, unable to tolerate CPAP (vomited every night) 09/05/2021   Chronic pain syndrome 09/05/2021   Chronic nausea 09/05/2021   Neurogenic bladder, followed by Urology, instructed I&O cath BID 09/05/2021   Intolerance of continuous positive airway pressure (CPAP) ventilation 09/03/2021   Insomnia secondary to chronic pain 07/24/2021   Primary osteoarthritis of right knee 07/03/2021   Myofascial pain dysfunction syndrome 07/03/2021   Fibromyalgia 07/03/2021   Hyponatremia 07/03/2021   Primary  osteoarthritis of left knee 07/03/2021   Body mass index (BMI) 50.0-59.9, adult ([place]) 03/07/2021   Allergic rhinitis due to animal (cat) (dog) hair and dander 10/06/2020   Allergic rhinitis due to pollen 10/06/2020   Moderate persistent asthma, uncomplicated [id number]   Cerebellar mass 10/06/2020   Paresthesia 05/27/2020   Diabetes mellitus ([place]) 05/25/2020   Hypertension associated with diabetes ([place]) 05/25/2020   Hyperlipidemia associated with type 2 diabetes mellitus ([place]) 05/25/2020   Vitamin D deficiency 05/25/2020   NAFLD (nonalcoholic fatty liver disease) 05/25/2020   Sciatica of left side 05/25/2020    ONSET DATE: 08/24/2022 (referral date-ongoing since March 2023)  REFERRING DIAG: D49.6 (ICD-10-CM) - Cerebellar tumor ([place])  THERAPY DIAG:  Other abnormalities of gait and mobility  Unsteadiness on feet  Muscle weakness (generalized)  History of falling  Other lack of coordination  Rationale for Evaluation and Treatment: Rehabilitation  SUBJECTIVE:                                                                                                                                                                                             SUBJECTIVE STATEMENT: Pt denies falls or recent near falls.  She states she is struggling w/ dry mouth this morning that she thinks is related to a new bladder medicine.  PT provides water and ice chips at patient request prior to onset of activity.  She has been working on [employer], monster walks, and chin tucks.  She is conscious of her posture w/ walking.  She states her recent surgery went well and she is feeling better. Pt accompanied by: self  PERTINENT HISTORY: DM2, HTN, left sciatica, fibromyalgia, asthma, and hyperlipidemia  From H&P note on 11/15/2021:  "On the day of admission 11/07/2021, the patient was taken to the operating room by Dr. Recardo Luna of neurosurgery and underwent left suboccipital craniotomy, resection of cerebellar  tumor. She tolerated the procedure well. Postoperative MRI performed on 3/22 revealed a small area  of diffusion restriction in the left cerebellum."  Patient transferred to CIR on 11/15/2021, discharged 11/24/2021 per pt.    Patient completed PT here 4/11-9/1 and a second episode at [facility] for same from November to December 2023.  PAIN:  Are you having pain? Yes: NPRS scale: 8.5/10 Pain location: Right hemibody Pain description: "tolerable" Aggravating factors: standing up Relieving factors: unsure  PRECAUTIONS: Fall  WEIGHT BEARING RESTRICTIONS: No  FALLS: Has patient fallen in last 6 months? Yes. Number of falls 1-"I don't really remember what happened"  She is unsure how long ago this happened.  She fell on the right side, right thigh hurts.  OBJECTIVE:  LUE VITALS: Today's Vitals   10/29/22 1108  BP: (!) 155/82   TODAY'S TREATMENT:                                                                                                                              TherAct: -Assessed BP -Discussed upcoming MD visits and pt concerns for upcoming scans and ongoing progress w/ PT. -Stair safety review using bilateral rails and step-through gait, pt requires close SBA and cues for controlled lower on descent vs plopping onto stance LE. -SciFit x6 minutes on level 3.5 for cardiovascular endurance and strength using BUE/BLE, RPE 6/10 following task.  NMR: -Forward and backward ambulation w/ RUE support on counter w/ head turns > head nods > eye closed x3 each direction in each condition -4" hurdles forwards using step-to progressed to step-through w/ single UE support, cued to prevent circumduction compensation, x8 -4" hurdles laterally progressed to unilateral UE support, more difficulty to the right than left, x4    PATIENT EDUCATION: Education details:  Continue HEP.  Walk regularly w/ rollator to build endurance.  Discussed importance of activity at home and likelihood of discharge  following next visits goal assessment based on progress. Person educated: Patient Education method: Explanation Education comprehension: verbalized understanding and needs further education  HOME EXERCISE PROGRAM:  Access Code: [email or web address] URL: [email or web address] Date: 09/17/2022 Prepared by: Elease Etienne  Exercises - Sit to Stand Without Arm Support  - 1 x daily - 7 x weekly - 3 sets - 10 reps - Chin Tuck  - 1 x daily - 7 x weekly - 3 sets - 10 reps - Seated Cervical Sidebending Stretch  - 1 x daily - 7 x weekly - 3 sets - 10 reps - Supine Bridge  - 1 x daily - 4 x weekly - 3 sets - 10 reps - Forward and Backward Monster Walk with Counter Support  - 1 x daily - 4 x weekly - 3 sets - 10 reps - Seated Heel Raise  - 1 x daily - 4 x weekly - 2 sets - 12 reps - Single Leg Stance on Pillow  - 1 x daily - 4 x weekly - 1 sets - 4 reps  GOALS: Goals reviewed with patient? Yes  SHORT TERM GOALS: Target date: 10/05/2022  Pt will be compliant and independent with advanced strength and balance HEP in order to maintain progress and promote carryover to home. Baseline:  Reviewed and modified, pt compliant (2/12) Goal status: MET  2.  Pt will decrease 5xSTS to </=15 seconds w/ BUE support in order to demonstrate decreased risk for falls and improved functional bilateral LE strength and power. Baseline: 18.72 sec w/ BUE support; 17.25 seconds w/ BUE support (2/12) Goal status: IN PROGRESS  3.  Pt will demonstrate TUG of </=13 seconds using LRAD in order to decrease risk of falls and improve functional mobility. Baseline: 14.22 seconds w/ rollator; 12.56 seconds w/ rollator (2/12) Goal status: MET  4.  Pt will demonstrate a gait speed of >/=3.0 feet/sec using LRAD in order to decrease risk for falls. Baseline: 2.79 ft/sec w/ rollator; 2.83 ft/sec w/ rollator (2/12) Goal status: IN PROGRESS  5.  Pt will increase BERG balance score to >/=41/56 to demonstrate improved static  balance. Baseline: 37/56; 43/56 (2/12) Goal status: MET  6.  FGA to be assessed at STG goal deadline w/ LTG set as appropriate. Baseline: 11/30 (2/12) Goal status: MET  LONG TERM GOALS: Target date: 11/02/2022  Pt to be compliant to formal walking program or alternative aerobic exercise to maintain tolerance and ambulatory balance. Baseline: To be established. Goal status: INITIAL  2.  Pt will decrease 5xSTS to </=13 seconds w/o BUE support in order to demonstrate decreased risk for falls and improved functional bilateral LE strength and power. Baseline: 18.72 seconds w/ BUE support Goal status: INITIAL  3.  Pt will demonstrate a gait speed of >/=3.3 feet/sec using LRAD in order to decrease risk for falls. Baseline: 2.79 ft/sec w/ rollator Goal status: INITIAL  4.  Pt will improve FGA score to >/=19/30 in order to demonstrate improved balance and decreased fall risk. Baseline: 11/30 (2/12) Goal status: INITIAL  ASSESSMENT:  CLINICAL IMPRESSION: Emphasis of skilled session today on addressing stair safety and dynamic stability and trunk control w/o LE compensation.  She does very well this session w/ increased SLS time, LE motor planning, and core control during hurdle tasks.  Discussed assessment of LTGs next session w/ decision to be made regarding discharge to home management vs re-cert based on progress made.  She verbalizes understanding and PT emphasizing importance of maintaining ambulatory level of activity and using HEP as supplement to routine.  OBJECTIVE IMPAIRMENTS: Abnormal gait, decreased balance, decreased coordination, decreased endurance, decreased strength, increased edema, improper body mechanics, postural dysfunction, obesity, and pain.   ACTIVITY LIMITATIONS: carrying, lifting, squatting, stairs, transfers, and locomotion level  PARTICIPATION LIMITATIONS: meal prep, cleaning, driving, shopping, and community activity  PERSONAL FACTORS: Age, Fitness,  Past/current experiences, Sex, Social background, Time since onset of injury/illness/exacerbation, and 1-2 comorbidities: fibromyalgia and HTN  are also affecting patient's functional outcome.   REHAB POTENTIAL: Good  CLINICAL DECISION MAKING: Stable/uncomplicated  EVALUATION COMPLEXITY: Low  PLAN:  PT FREQUENCY: 1x/week  PT DURATION: 8 weeks  PLANNED INTERVENTIONS: Therapeutic exercises, Therapeutic activity, Neuromuscular re-education, Balance training, Gait training, Patient/Family education, Self Care, Joint mobilization, Stair training, Vestibular training, DME instructions, Manual therapy, and Re-evaluation  PLAN FOR NEXT SESSION: Check L BP!   Assessed LTGs-D/C?  Bary Richard, PT, DPT 10/29/2022, 1:35 PM

## 2022-10-30 DIAGNOSIS — J301 Allergic rhinitis due to pollen: Secondary | ICD-10-CM | POA: Diagnosis not present

## 2022-10-30 DIAGNOSIS — J3081 Allergic rhinitis due to animal (cat) (dog) hair and dander: Secondary | ICD-10-CM | POA: Diagnosis not present

## 2022-10-30 DIAGNOSIS — J3089 Other allergic rhinitis: Secondary | ICD-10-CM | POA: Diagnosis not present

## 2022-10-31 DIAGNOSIS — J4520 Mild intermittent asthma, uncomplicated: Secondary | ICD-10-CM | POA: Diagnosis not present

## 2022-10-31 DIAGNOSIS — G894 Chronic pain syndrome: Secondary | ICD-10-CM | POA: Diagnosis not present

## 2022-10-31 DIAGNOSIS — E119 Type 2 diabetes mellitus without complications: Secondary | ICD-10-CM | POA: Diagnosis not present

## 2022-10-31 DIAGNOSIS — G43909 Migraine, unspecified, not intractable, without status migrainosus: Secondary | ICD-10-CM | POA: Diagnosis not present

## 2022-10-31 DIAGNOSIS — Z86018 Personal history of other benign neoplasm: Secondary | ICD-10-CM | POA: Diagnosis not present

## 2022-10-31 DIAGNOSIS — I10 Essential (primary) hypertension: Secondary | ICD-10-CM | POA: Diagnosis not present

## 2022-10-31 DIAGNOSIS — M797 Fibromyalgia: Secondary | ICD-10-CM | POA: Diagnosis not present

## 2022-10-31 DIAGNOSIS — N200 Calculus of kidney: Secondary | ICD-10-CM | POA: Diagnosis not present

## 2022-10-31 DIAGNOSIS — E7800 Pure hypercholesterolemia, unspecified: Secondary | ICD-10-CM | POA: Diagnosis not present

## 2022-11-01 ENCOUNTER — Ambulatory Visit: Payer: Medicare HMO | Admitting: Family Medicine

## 2022-11-02 ENCOUNTER — Encounter: Payer: Medicare HMO | Attending: Physical Medicine and Rehabilitation | Admitting: Physical Medicine and Rehabilitation

## 2022-11-02 ENCOUNTER — Encounter: Payer: Self-pay | Admitting: Physical Medicine and Rehabilitation

## 2022-11-02 DIAGNOSIS — R110 Nausea: Secondary | ICD-10-CM | POA: Diagnosis not present

## 2022-11-02 DIAGNOSIS — M7918 Myalgia, other site: Secondary | ICD-10-CM | POA: Diagnosis not present

## 2022-11-02 DIAGNOSIS — M797 Fibromyalgia: Secondary | ICD-10-CM | POA: Insufficient documentation

## 2022-11-02 MED ORDER — OXCARBAZEPINE 300 MG PO TABS: 900.0000 mg | ORAL_TABLET | Freq: Every day | ORAL | 1 refills | Status: DC

## 2022-11-02 MED ORDER — LIDOCAINE HCL 1 % IJ SOLN
6.0000 mL | Freq: Once | INTRAMUSCULAR | Status: AC
  Administered 2022-11-02: 6 mL

## 2022-11-02 NOTE — Progress Notes (Signed)
Pt is a 62 yr old female with hx of fibromyalgia- dx'd at age 68, DM2,- Ac1 6.2;  asthma; HTN, OSA, BMI is 50; Nodule in L superior cerebellum. Also has migraines- intractable and daily; has trace aura;  No kidney issues- Cr 0.65 and BUN 15 in 6/22. Also end stage OA of B/L knees medially and R hip pain. Also has myofascial pain syndrome.  Has new brain mass that was documented Worsening cerebellar Sx's. 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        Had R kidney stone- s/p lithotripsy on 10/16/22- stent has been removed.   Went to dentist yesterday- got a few fillings- cut her accidentally-   Talked to her surgical Neurosurgeon- didn't see her, but said "everything ok"- they want Dr Tomi Likens to send a copy of June MRI to them-  Sees Dr Tomi Likens in April -will have to sign to see it sent to [facility] who did cerebellar tumor surgery.   Restarted therapy again- has 1 more therapy session.  Will determine if needs to get extension.  She thinks no more.    Walking with rollator- pt reports she has learned to Balance to reduce her falls- but PT said could fall again, but thinks reduced amount.  Using strategies.   Last fall- been months ago- had a couple of nears in the last couple of days, but using those strategies, hasn't actually fallen.    Wants trigger point injections today- they "always help".  Having head/neck /shoulder tightness  Zofran prn- takes s needed- maybe 1-3x/week depending on how feeling Still taking Trileptal-  Every night   Exam: Much less swelling around L cerebellar scar- almost gone Plan: Patient here for trigger point injections for myofascial pain  Consent done and on chart.  Cleaned areas with alcohol and injected using a 27 gauge 1.5 inch needle  Injected 5cc- 1 cc wasted Using 1% Lidocaine with no EPI  Upper traps B/L  Levators- B/L  Posterior scalenes Middle  scalenes- B/L  Splenius Capitus- B/L  Pectoralis Major- B/L  Rhomboids B/L  Infraspinatus Teres Major/minor Thoracic paraspinals Lumbar paraspinals Other injections-    Patient's level of pain prior was HA 9/10-  Current level of pain after injections is down to 8/10- tightness has improved 1 point  There was no bleeding or complications.  Patient was advised to drink a lot of water on day after injections to flush system Will have increased soreness for 12-48 hours after injections.  Can use Lidocaine patches the day AFTER injections Can use theracane on day of injections in places didn't inject Can use heating pad 4-6 hours AFTER injections   2. Con't Zofran 4 mg prn- for chronic nausea- has new refill just sent   3. Con't Trileptal/Oxcarbemazepine- 900 mg nightly- for nerve pain- 270 pills with 1 refill  4. F/U in 6-8 weeks- for TrP injections  5. Monitor swelling around cerebellar scar- is almost gone now,  but was much worse at last appointment- so keep on eye on it.

## 2022-11-02 NOTE — Patient Instructions (Signed)
Exam: Much less swelling around L cerebellar scar- almost gone Plan: Patient here for trigger point injections for myofascial pain  Consent done and on chart.  Cleaned areas with alcohol and injected using a 27 gauge 1.5 inch needle  Injected 5cc- 1 cc wasted Using 1% Lidocaine with no EPI  Upper traps B/L  Levators- B/L  Posterior scalenes Middle scalenes- B/L  Splenius Capitus- B/L  Pectoralis Major- B/L  Rhomboids B/L  Infraspinatus Teres Major/minor Thoracic paraspinals Lumbar paraspinals Other injections-    Patient's level of pain prior was HA 9/10-  Current level of pain after injections is down to 8/10- tightness has improved 1 point  There was no bleeding or complications.  Patient was advised to drink a lot of water on day after injections to flush system Will have increased soreness for 12-48 hours after injections.  Can use Lidocaine patches the day AFTER injections Can use theracane on day of injections in places didn't inject Can use heating pad 4-6 hours AFTER injections   2. Con't Zofran 4 mg prn- for chronic nausea- has new refill just sent   3. Con't Trileptal/Oxcarbemazepine- 900 mg nightly- for nerve pain- 270 pills with 1 refill  4. F/U in 6-8 weeks- for TrP injections  5. Monitor swelling around cerebellar scar- is almost gone now,  but was much worse at last appointment- so keep on eye on it.

## 2022-11-05 ENCOUNTER — Ambulatory Visit: Payer: Medicare HMO | Admitting: Physical Therapy

## 2022-11-05 ENCOUNTER — Encounter: Payer: Self-pay | Admitting: Physical Therapy

## 2022-11-05 VITALS — BP 156/93 | HR 90

## 2022-11-05 DIAGNOSIS — M6281 Muscle weakness (generalized): Secondary | ICD-10-CM | POA: Diagnosis not present

## 2022-11-05 DIAGNOSIS — R2681 Unsteadiness on feet: Secondary | ICD-10-CM

## 2022-11-05 DIAGNOSIS — R278 Other lack of coordination: Secondary | ICD-10-CM | POA: Diagnosis not present

## 2022-11-05 DIAGNOSIS — R2689 Other abnormalities of gait and mobility: Secondary | ICD-10-CM

## 2022-11-05 DIAGNOSIS — Z9181 History of falling: Secondary | ICD-10-CM | POA: Diagnosis not present

## 2022-11-05 NOTE — Therapy (Signed)
OUTPATIENT PHYSICAL THERAPY NEURO TREATMENT/DISCHARGE SUMMARY   Patient Name: Virginia Luna MRN: [id number] DOB:February 12, 1961, 62 y.o., female Today's Date: 11/05/2022   PCP: Shirline Frees, MD REFERRING PROVIDER: Courtney Heys, MD  PHYSICAL THERAPY DISCHARGE SUMMARY  Visits from Start of Care: 8  Current functional level related to goals / functional outcomes: See clinical impression statement.   Remaining deficits: Elevated fall risk, reliance on rollator for long or unlevel distances, decreased activity tolerance.   Education / Equipment: Continue HEP.  Walking program.  Discharge plan w/ progress towards goals.   Patient agrees to discharge. Patient goals were partially met. Patient is being discharged due to maximized rehab potential.    END OF SESSION:  PT End of Session - 11/05/22 1544     Visit Number 8    Number of Visits 9    Date for PT Re-Evaluation 11/09/22   pushed out due to scheduling delay   Authorization Type [employer] MEDICARE    Authorization Time Period 09/03/22-11/09/22    Progress Note Due on Visit 10    PT Start Time 1538   pt engaging w/ another pt in lobby, PT waited until interaction over to initiate session.   PT Stop Time 1621    PT Time Calculation (min) 43 min    Equipment Utilized During Treatment Gait belt    Activity Tolerance Patient tolerated treatment well    Behavior During Therapy WFL for tasks assessed/performed              Past Medical History:  Diagnosis Date   Allergies    Anemia    Arthritis    Asthma    Back pain    Chronic pain    Complication of anesthesia    woke up during colonoscopy   Diabetes ([place])    Diabetes mellitus without complication (HCC)    Edema, lower extremity    Fibroid    Fibromyalgia    Chronic   GERD (gastroesophageal reflux disease)    Headache    migraines   Hepatic steatosis    High blood pressure    History of hiatal hernia    History of kidney stones    History of stomach  ulcers    IBS (irritable bowel syndrome)    Joint pain    Sleep apnea    did use cpap-lost 25lb-says she does not need it   NO CPAP   Wears contact lenses    Past Surgical History:  Procedure Laterality Date   BRAIN SURGERY Left 11/07/2021   [facility] Dr. Herschel [place]   BREAST BIOPSY     BREAST EXCISIONAL BIOPSY     BREAST LUMPECTOMY WITH RADIOACTIVE SEED LOCALIZATION Bilateral 11/24/2014   Procedure: BILATERAL BREAST LUMPECTOMY WITH RADIOACTIVE SEED LOCALIZATION;  Surgeon: [name], MD;  Location: [facility];  Service: General;  Laterality: Bilateral;   CHOLECYSTECTOMY     COLONOSCOPY     CYSTOSCOPY WITH RETROGRADE PYELOGRAM, URETEROSCOPY AND STENT PLACEMENT Right 10/16/2022   Procedure: CYSTOSCOPY WITH RETROGRADE PYELOGRAM, URETEROSCOPY AND STENT PLACEMENT;  Surgeon: Robley Fries, MD;  Location: [facility];  Service: Urology;  Laterality: Right;   DILATION AND CURETTAGE OF UTERUS     ESOPHAGOGASTRODUODENOSCOPY (EGD) WITH PROPOFOL N/A 07/23/2016   Procedure: ESOPHAGOGASTRODUODENOSCOPY (EGD) WITH PROPOFOL;  Surgeon: Laurence Spates, MD;  Location: [facility];  Service: Endoscopy;  Laterality: N/A;   ESOPHAGOGASTRODUODENOSCOPY (EGD) WITH PROPOFOL N/A 06/11/2018   Procedure: ESOPHAGOGASTRODUODENOSCOPY (EGD) WITH PROPOFOL;  Surgeon: Laurence Spates, MD;  Location:  [facility];  Service: Endoscopy;  Laterality: N/A;   HOLMIUM LASER APPLICATION Right [id number]   Procedure: HOLMIUM LASER APPLICATION;  Surgeon: Robley Fries, MD;  Location: [facility];  Service: Urology;  Laterality: Right;   LUMBAR LAMINECTOMY  2010   ORIF WRIST FRACTURE Right 11/24/2020   Procedure: OPEN REDUCTION INTERNAL FIXATION RIGHT WRIST FRACTURE;  Surgeon: Renette Butters, MD;  Location: [facility];  Service: Orthopedics;  Laterality: Right;   OVARIAN CYST SURGERY     UMBILICAL HERNIA REPAIR     age 58   UPPER GI ENDOSCOPY     Patient Active Problem List   Diagnosis Date Noted   Cerebellar tumor  ([place]) 11/15/2021   Comorbid sleep-related hypoventilation 10/19/2021   Urinary incontinence/areflexic bladder 10/05/2021   OSA, unable to tolerate CPAP (vomited every night) 09/05/2021   Chronic pain syndrome 09/05/2021   Chronic nausea 09/05/2021   Neurogenic bladder, followed by Urology, instructed I&O cath BID 09/05/2021   Intolerance of continuous positive airway pressure (CPAP) ventilation 09/03/2021   Insomnia secondary to chronic pain 07/24/2021   Primary osteoarthritis of right knee 07/03/2021   Myofascial pain dysfunction syndrome 07/03/2021   Fibromyalgia 07/03/2021   Hyponatremia 07/03/2021   Primary osteoarthritis of left knee 07/03/2021   Body mass index (BMI) 50.0-59.9, adult ([place]) 03/07/2021   Allergic rhinitis due to animal (cat) (dog) hair and dander 10/06/2020   Allergic rhinitis due to pollen 10/06/2020   Moderate persistent asthma, uncomplicated [id number]   Cerebellar mass 10/06/2020   Paresthesia 05/27/2020   Diabetes mellitus ([place]) 05/25/2020   Hypertension associated with diabetes ([place]) 05/25/2020   Hyperlipidemia associated with type 2 diabetes mellitus ([place]) 05/25/2020   Vitamin D deficiency 05/25/2020   NAFLD (nonalcoholic fatty liver disease) 05/25/2020   Sciatica of left side 05/25/2020    ONSET DATE: 08/24/2022 (referral date-ongoing since March 2023)  REFERRING DIAG: D49.6 (ICD-10-CM) - Cerebellar tumor ([place])  THERAPY DIAG:  Other abnormalities of gait and mobility  Unsteadiness on feet  Muscle weakness (generalized)  History of falling  Other lack of coordination  Rationale for Evaluation and Treatment: Rehabilitation  SUBJECTIVE:                                                                                                                                                                                             SUBJECTIVE STATEMENT: Pt denies falls.  She states she is jittery today.  She reports she had a dental drill fall into her  mouth and cut her mouth last week which has not seemed to bother her much since then, but reports this scared her.  She  states she has had near falls when she was dizzy the past week where she had to catch herself and steady herself because she was shaky. Pt accompanied by: self  PERTINENT HISTORY: DM2, HTN, left sciatica, fibromyalgia, asthma, and hyperlipidemia  From H&P note on 11/15/2021:  "On the day of admission 11/07/2021, the patient was taken to the operating room by Dr. Recardo Evangelist of neurosurgery and underwent left suboccipital craniotomy, resection of cerebellar tumor. She tolerated the procedure well. Postoperative MRI performed on 3/22 revealed a small area of diffusion restriction in the left cerebellum."  Patient transferred to CIR on 11/15/2021, discharged 11/24/2021 per pt.    Patient completed PT here 4/11-9/1 and a second episode at [facility] for same from November to December 2023.  PAIN:  Are you having pain? No-shakiness is more of a concern this visit, PT cannot visibly assess shakiness, pt appears w/ steady extremities.    PRECAUTIONS: Fall  WEIGHT BEARING RESTRICTIONS: No  FALLS: Has patient fallen in last 6 months? Yes. Number of falls 1-"I don't really remember what happened"  She is unsure how long ago this happened.  She fell on the right side, right thigh hurts.  OBJECTIVE:  LUE VITALS: Today's Vitals   11/05/22 1539  BP: (!) 156/93  Pulse: 90   TODAY'S TREATMENT:                                                                                                                              TherAct: -Assessed BP -Verbally reviewed unofficial walking program, pt states she walks intermittently everyday.  Discussed more formal walking program trying to walk for 8-10 minutes continuously or using standing breaks for 4-5 days per week.   -5xSTS w/ BUE support:  14.13 seconds -5xSTS w/ no UE support:  14.57 seconds -10MWT w/ rollator:  10.59 seconds = 0.94 m/sec OR  3.12 ft/sec -FGA:  [facility] PT Assessment - 11/05/22 1600       Functional Gait  Assessment   Gait assessed  Yes    Gait Level Surface Walks 20 ft in less than 7 sec but greater than 5.5 sec, uses assistive device, slower speed, mild gait deviations, or deviates 6-10 in outside of the 12 in walkway width.    Change in Gait Speed Able to change speed, demonstrates mild gait deviations, deviates 6-10 in outside of the 12 in walkway width, or no gait deviations, unable to achieve a major change in velocity, or uses a change in velocity, or uses an assistive device.    Gait with Horizontal Head Turns Performs head turns smoothly with slight change in gait velocity (eg, minor disruption to smooth gait path), deviates 6-10 in outside 12 in walkway width, or uses an assistive device.    Gait with Vertical Head Turns Performs task with moderate change in gait velocity, slows down, deviates 10-15 in outside 12 in walkway width but recovers, can continue to walk.    Gait  and Pivot Turn Pivot turns safely in greater than 3 sec and stops with no loss of balance, or pivot turns safely within 3 sec and stops with mild imbalance, requires small steps to catch balance.    Step Over Obstacle Is able to step over one shoe box (4.5 in total height) but must slow down and adjust steps to clear box safely. May require verbal cueing.    Gait with Narrow Base of Support Ambulates less than 4 steps heel to toe or cannot perform without assistance.    Gait with Eyes Closed Walks 20 ft, uses assistive device, slower speed, mild gait deviations, deviates 6-10 in outside 12 in walkway width. Ambulates 20 ft in less than 9 sec but greater than 7 sec.    Ambulating Backwards Walks 20 ft, uses assistive device, slower speed, mild gait deviations, deviates 6-10 in outside 12 in walkway width.    Steps Two feet to a stair, must use rail.    Total Score 15    FGA comment: 15/30 = high fall risk            -Pt is tearful at end  of session regarding ongoing personal situations, PT provides therapeutic listening and encouragement.  PATIENT EDUCATION: Education details:  Continue HEP.  Walking program.  Discharge plan w/ progress towards goals. Person educated: Patient Education method: Explanation Education comprehension: verbalized understanding and needs further education  HOME EXERCISE PROGRAM:  Access Code: [email or web address] URL: [email or web address] Date: 09/17/2022 Prepared by: Elease Etienne  Exercises - Sit to Stand Without Arm Support  - 1 x daily - 7 x weekly - 3 sets - 10 reps - Chin Tuck  - 1 x daily - 7 x weekly - 3 sets - 10 reps - Seated Cervical Sidebending Stretch  - 1 x daily - 7 x weekly - 3 sets - 10 reps - Supine Bridge  - 1 x daily - 4 x weekly - 3 sets - 10 reps - Forward and Backward Monster Walk with Counter Support  - 1 x daily - 4 x weekly - 3 sets - 10 reps - Seated Heel Raise  - 1 x daily - 4 x weekly - 2 sets - 12 reps - Single Leg Stance on Pillow  - 1 x daily - 4 x weekly - 1 sets - 4 reps  Walking program: Walk 8-10 minutes continuously or using standing breaks for 4-5 days per week, increase by 1-2 minutes every week.  GOALS: Goals reviewed with patient? Yes  SHORT TERM GOALS: Target date: 10/05/2022  Pt will be compliant and independent with advanced strength and balance HEP in order to maintain progress and promote carryover to home. Baseline:  Reviewed and modified, pt compliant (2/12) Goal status: MET  2.  Pt will decrease 5xSTS to </=15 seconds w/ BUE support in order to demonstrate decreased risk for falls and improved functional bilateral LE strength and power. Baseline: 18.72 sec w/ BUE support; 17.25 seconds w/ BUE support (2/12) Goal status: IN PROGRESS  3.  Pt will demonstrate TUG of </=13 seconds using LRAD in order to decrease risk of falls and improve functional mobility. Baseline: 14.22 seconds w/ rollator; 12.56 seconds w/ rollator (2/12) Goal  status: MET  4.  Pt will demonstrate a gait speed of >/=3.0 feet/sec using LRAD in order to decrease risk for falls. Baseline: 2.79 ft/sec w/ rollator; 2.83 ft/sec w/ rollator (2/12) Goal status: IN PROGRESS  5.  Pt will increase BERG balance score to >/=  41/56 to demonstrate improved static balance. Baseline: 37/56; 43/56 (2/12) Goal status: MET  6.  FGA to be assessed at STG goal deadline w/ LTG set as appropriate. Baseline: 11/30 (2/12) Goal status: MET  LONG TERM GOALS: Target date: 11/02/2022  Pt to be compliant to formal walking program or alternative aerobic exercise to maintain tolerance and ambulatory balance. Baseline: Established formal program 3/18, pt has been compliant to short bouts of walking everyday. Goal status: MET  2.  Pt will decrease 5xSTS to </=13 seconds w/o BUE support in order to demonstrate decreased risk for falls and improved functional bilateral LE strength and power. Baseline: 18.72 seconds w/ BUE support; 14.57 seconds no UE support (11/05/2022) Goal status: PARTIALLY MET  3.  Pt will demonstrate a gait speed of >/=3.3 feet/sec using LRAD in order to decrease risk for falls. Baseline: 2.79 ft/sec w/ rollator; 3.12 ft/sec w/ rollator (11/05/2022) Goal status: PARTIALLY MET  4.  Pt will improve FGA score to >/=19/30 in order to demonstrate improved balance and decreased fall risk. Baseline: 11/30 (2/12); 15/30 (11/05/2022) Goal status: PARTIALLY MET  ASSESSMENT:  CLINICAL IMPRESSION: Assessed LTGs with patient partially meeting all 4 goals.  Formally established a walking program today to better guide patient efforts in progressing and maintaining ambulatory tolerance.  She significantly decreased her 5xSTS time to 14.57 seconds w/o UE support demonstrating improved LE strength.  She predominantly uses rollator for community mobility due to fall risk, but is able to ambulate short distances without it safely, somewhat limited by decreased tolerance w/o AD.   Today she ambulates at a speed of 3.12 ft/sec w/ the rollator placing her in a community ambulator category.  Her FGA score improved from 11 to 15/30 which is a significant improvement within the same elevated fall risk category.  Overall, she has spent a significant amount of time in physical therapy at this clinic and [facility] separately over recent months.  At this time she has the tools necessary to attempt self-management of ongoing symptoms at home with plan to return in 4 months with new referral if a status change occurs or patient is failing to manage adequately at home.  Pt is in agreement to discharge today as maximum rehab potential has been met in clinic.  OBJECTIVE IMPAIRMENTS: Abnormal gait, decreased balance, decreased coordination, decreased endurance, decreased strength, increased edema, improper body mechanics, postural dysfunction, obesity, and pain.   ACTIVITY LIMITATIONS: carrying, lifting, squatting, stairs, transfers, and locomotion level  PARTICIPATION LIMITATIONS: meal prep, cleaning, driving, shopping, and community activity  PERSONAL FACTORS: Age, Fitness, Past/current experiences, Sex, Social background, Time since onset of injury/illness/exacerbation, and 1-2 comorbidities: fibromyalgia and HTN  are also affecting patient's functional outcome.   REHAB POTENTIAL: Good  CLINICAL DECISION MAKING: Stable/uncomplicated  EVALUATION COMPLEXITY: Low  PLAN:  PT FREQUENCY: 1x/week  PT DURATION: 8 weeks  PLANNED INTERVENTIONS: Therapeutic exercises, Therapeutic activity, Neuromuscular re-education, Balance training, Gait training, Patient/Family education, Self Care, Joint mobilization, Stair training, Vestibular training, DME instructions, Manual therapy, and Re-evaluation  PLAN FOR NEXT SESSION: N/A  Bary Richard, PT, DPT 11/05/2022, 5:12 PM

## 2022-11-06 DIAGNOSIS — J3081 Allergic rhinitis due to animal (cat) (dog) hair and dander: Secondary | ICD-10-CM | POA: Diagnosis not present

## 2022-11-06 DIAGNOSIS — J301 Allergic rhinitis due to pollen: Secondary | ICD-10-CM | POA: Diagnosis not present

## 2022-11-06 DIAGNOSIS — J3089 Other allergic rhinitis: Secondary | ICD-10-CM | POA: Diagnosis not present

## 2022-11-12 DIAGNOSIS — E7800 Pure hypercholesterolemia, unspecified: Secondary | ICD-10-CM | POA: Diagnosis not present

## 2022-11-12 DIAGNOSIS — J4520 Mild intermittent asthma, uncomplicated: Secondary | ICD-10-CM | POA: Diagnosis not present

## 2022-11-12 DIAGNOSIS — K219 Gastro-esophageal reflux disease without esophagitis: Secondary | ICD-10-CM | POA: Diagnosis not present

## 2022-11-12 DIAGNOSIS — I10 Essential (primary) hypertension: Secondary | ICD-10-CM | POA: Diagnosis not present

## 2022-11-12 DIAGNOSIS — E119 Type 2 diabetes mellitus without complications: Secondary | ICD-10-CM | POA: Diagnosis not present

## 2022-11-13 DIAGNOSIS — J3089 Other allergic rhinitis: Secondary | ICD-10-CM | POA: Diagnosis not present

## 2022-11-13 DIAGNOSIS — J3081 Allergic rhinitis due to animal (cat) (dog) hair and dander: Secondary | ICD-10-CM | POA: Diagnosis not present

## 2022-11-13 DIAGNOSIS — J301 Allergic rhinitis due to pollen: Secondary | ICD-10-CM | POA: Diagnosis not present

## 2022-11-22 DIAGNOSIS — J301 Allergic rhinitis due to pollen: Secondary | ICD-10-CM | POA: Diagnosis not present

## 2022-11-22 DIAGNOSIS — J3081 Allergic rhinitis due to animal (cat) (dog) hair and dander: Secondary | ICD-10-CM | POA: Diagnosis not present

## 2022-11-22 DIAGNOSIS — J3089 Other allergic rhinitis: Secondary | ICD-10-CM | POA: Diagnosis not present

## 2022-11-23 DIAGNOSIS — J018 Other acute sinusitis: Secondary | ICD-10-CM | POA: Diagnosis not present

## 2022-11-27 ENCOUNTER — Encounter (HOSPITAL_COMMUNITY): Payer: Self-pay | Admitting: Internal Medicine

## 2022-11-27 DIAGNOSIS — J3081 Allergic rhinitis due to animal (cat) (dog) hair and dander: Secondary | ICD-10-CM | POA: Diagnosis not present

## 2022-11-27 DIAGNOSIS — J301 Allergic rhinitis due to pollen: Secondary | ICD-10-CM | POA: Diagnosis not present

## 2022-11-27 DIAGNOSIS — J3089 Other allergic rhinitis: Secondary | ICD-10-CM | POA: Diagnosis not present

## 2022-11-30 DIAGNOSIS — E119 Type 2 diabetes mellitus without complications: Secondary | ICD-10-CM | POA: Diagnosis not present

## 2022-11-30 DIAGNOSIS — I10 Essential (primary) hypertension: Secondary | ICD-10-CM | POA: Diagnosis not present

## 2022-11-30 DIAGNOSIS — E7800 Pure hypercholesterolemia, unspecified: Secondary | ICD-10-CM | POA: Diagnosis not present

## 2022-11-30 DIAGNOSIS — K219 Gastro-esophageal reflux disease without esophagitis: Secondary | ICD-10-CM | POA: Diagnosis not present

## 2022-11-30 DIAGNOSIS — J4520 Mild intermittent asthma, uncomplicated: Secondary | ICD-10-CM | POA: Diagnosis not present

## 2022-12-03 ENCOUNTER — Encounter: Payer: Self-pay | Admitting: Physical Medicine and Rehabilitation

## 2022-12-03 ENCOUNTER — Encounter (HOSPITAL_COMMUNITY): Payer: Self-pay | Admitting: Internal Medicine

## 2022-12-03 ENCOUNTER — Encounter: Payer: Medicare HMO | Attending: Physical Medicine and Rehabilitation | Admitting: Physical Medicine and Rehabilitation

## 2022-12-03 VITALS — BP 136/83 | HR 94 | Temp 99.3°F | Ht 62.0 in | Wt 259.0 lb

## 2022-12-03 DIAGNOSIS — Z6841 Body Mass Index (BMI) 40.0 and over, adult: Secondary | ICD-10-CM | POA: Diagnosis not present

## 2022-12-03 DIAGNOSIS — M797 Fibromyalgia: Secondary | ICD-10-CM | POA: Diagnosis not present

## 2022-12-03 DIAGNOSIS — M7918 Myalgia, other site: Secondary | ICD-10-CM | POA: Diagnosis not present

## 2022-12-03 MED ORDER — LIDOCAINE HCL 1 % IJ SOLN
6.0000 mL | Freq: Once | INTRAMUSCULAR | Status: AC
  Administered 2022-12-03: 6 mL

## 2022-12-03 NOTE — Anesthesia Preprocedure Evaluation (Signed)
Anesthesia Evaluation  Patient identified by MRN, date of birth, ID band Patient awake    Reviewed: Allergy & Precautions, NPO status , Patient's Chart, lab work & pertinent test results  History of Anesthesia Complications Negative for: history of anesthetic complications  Airway Mallampati: II  TM Distance: >3 FB Neck ROM: Full    Dental  (+) Dental Advisory Given   Pulmonary asthma , sleep apnea and Continuous Positive Airway Pressure Ventilation    Pulmonary exam normal        Cardiovascular hypertension, Pt. on medications Normal cardiovascular exam     Neuro/Psych  Headaches, Seizures -, Well Controlled,   Cerebellar mass   negative psych ROS   GI/Hepatic Neg liver ROS, hiatal hernia,GERD  Medicated,, Nauseous, vomited earlier this morning    Endo/Other  diabetes, Type 2  Morbid obesity  Renal/GU negative Renal ROS     Musculoskeletal  (+) Arthritis ,  Fibromyalgia -  Abdominal   Peds  Hematology negative hematology ROS (+)   Anesthesia Other Findings On GLP-1a Chronic pain, fibromyalgia    Reproductive/Obstetrics                             Anesthesia Physical Anesthesia Plan  ASA: 3  Anesthesia Plan: General   Post-op Pain Management: Minimal or no pain anticipated   Induction: Intravenous, Rapid sequence and Cricoid pressure planned  PONV Risk Score and Plan: 2 and Treatment may vary due to age or medical condition, Ondansetron and Dexamethasone  Airway Management Planned: Oral ETT  Additional Equipment: None  Intra-op Plan:   Post-operative Plan: Extubation in OR  Informed Consent: I have reviewed the patients History and Physical, chart, labs and discussed the procedure including the risks, benefits and alternatives for the proposed anesthesia with the patient or authorized representative who has indicated his/her understanding and acceptance.     Dental  advisory given  Plan Discussed with: CRNA and Anesthesiologist  Anesthesia Plan Comments:        Anesthesia Quick Evaluation

## 2022-12-03 NOTE — Progress Notes (Signed)
Pt is a 62 yr old female with hx of fibromyalgia- dx'd at age 32, DM2,- Ac1 6.2;  asthma; HTN, OSA, BMI is 50; Nodule in L superior cerebellum. Also has migraines- intractable and daily; has trace aura;  No kidney issues- Cr 0.65 and BUN 15 in 6/22. Also end stage OA of B/L knees medially and R hip pain. Also has myofascial pain syndrome.  Has new brain mass that was documented Worsening cerebellar Sx's. 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          Getting Endoscopy and Colonoscopy tomorrow.  Did a CT scan and found thickening  of colon and want to take a look.   Surgeon from [facility], wants results from Dr Everlena Cooper appointment and notes from hospital as well.    Last fell 2 weeks ago- was in kitchen and putting clothes in washing machine- not using rollator at the time- and fell on floor- hurt pride and knees a little sore- felt like brain Jarred"- waited a few minutes then got up.    Thinks swelling around cerebellar tumor scar is doing better.   Plan: Swelling around cerebellar scar looks better, but has 1 little localized area- not sure if swelling or if tight musculature.   2. Patient here for trigger point injections for  Consent done and on chart.  Cleaned areas with alcohol and injected using a 27 gauge 1.5 inch needle  Injected  6cc- no wastage Using 1% Lidocaine with no EPI  Upper traps B/L  Levators- B/L  Posterior scalenes Middle scalenes- B/L  Splenius Capitus Pectoralis Major- B/L  Rhomboids- B/L x2 Infraspinatus Teres Major/minor Thoracic paraspinals B/L x2 Lumbar paraspinals- B/L x2 Other injections-    Patient's level of pain prior was 8.5/10 Current level of pain after injections is- gone to 7/10 already  There was no bleeding or complications.  Patient was advised to drink a lot of water on day after injections to flush system Will have increased  soreness for 12-48 hours after injections.  Can use Lidocaine patches the day AFTER injections Can use theracane on day of injections in places didn't inject Can use heating pad 4-6 hours AFTER injections  3. Con't Zofran for nausea- has refills  4. Con't Trileptal for nerve pain- for Fibromyalgia-   5. Work on Software engineer- to reduce falls.   6. F/U in 6 weeks- trigger point injections and FMS

## 2022-12-03 NOTE — Patient Instructions (Signed)
Plan: Swelling around cerebellar scar looks better, but has 1 little localized area- not sure if swelling or if tight musculature.   2. Patient here for trigger point injections for  Consent done and on chart.  Cleaned areas with alcohol and injected using a 27 gauge 1.5 inch needle  Injected  6cc- no wastage Using 1% Lidocaine with no EPI  Upper traps B/L  Levators- B/L  Posterior scalenes Middle scalenes- B/L  Splenius Capitus Pectoralis Major- B/L  Rhomboids- B/L x2 Infraspinatus Teres Major/minor Thoracic paraspinals B/L x2 Lumbar paraspinals- B/L x2 Other injections-    Patient's level of pain prior was 8.5/10 Current level of pain after injections is- gone to 7/10 already  There was no bleeding or complications.  Patient was advised to drink a lot of water on day after injections to flush system Will have increased soreness for 12-48 hours after injections.  Can use Lidocaine patches the day AFTER injections Can use theracane on day of injections in places didn't inject Can use heating pad 4-6 hours AFTER injections  3. Con't Zofran for nausea- has refills  4. Con't Trileptal for nerve pain- for Fibromyalgia-   5. Work on Software engineer- to reduce falls.   6. F/U in 6 weeks- trigger point injections and FMS

## 2022-12-04 ENCOUNTER — Ambulatory Visit (HOSPITAL_BASED_OUTPATIENT_CLINIC_OR_DEPARTMENT_OTHER): Payer: Medicare HMO | Admitting: Anesthesiology

## 2022-12-04 ENCOUNTER — Ambulatory Visit (HOSPITAL_COMMUNITY)
Admission: RE | Admit: 2022-12-04 | Discharge: 2022-12-04 | Disposition: A | Discharge status: Home / Self Care | Payer: Medicare HMO | Attending: Internal Medicine | Admitting: Internal Medicine

## 2022-12-04 ENCOUNTER — Ambulatory Visit (HOSPITAL_COMMUNITY): Payer: Medicare HMO | Admitting: Anesthesiology

## 2022-12-04 ENCOUNTER — Encounter (HOSPITAL_COMMUNITY): Payer: Self-pay | Admitting: Internal Medicine

## 2022-12-04 ENCOUNTER — Encounter (HOSPITAL_COMMUNITY)
Admission: RE | Disposition: A | Discharge status: Home / Self Care | Payer: Self-pay | Source: Home / Self Care | Attending: Internal Medicine

## 2022-12-04 ENCOUNTER — Other Ambulatory Visit: Payer: Self-pay

## 2022-12-04 DIAGNOSIS — R933 Abnormal findings on diagnostic imaging of other parts of digestive tract: Secondary | ICD-10-CM | POA: Diagnosis not present

## 2022-12-04 DIAGNOSIS — Z9989 Dependence on other enabling machines and devices: Secondary | ICD-10-CM | POA: Diagnosis not present

## 2022-12-04 DIAGNOSIS — R1310 Dysphagia, unspecified: Secondary | ICD-10-CM | POA: Diagnosis not present

## 2022-12-04 DIAGNOSIS — G4730 Sleep apnea, unspecified: Secondary | ICD-10-CM | POA: Diagnosis not present

## 2022-12-04 DIAGNOSIS — G4733 Obstructive sleep apnea (adult) (pediatric): Secondary | ICD-10-CM | POA: Diagnosis not present

## 2022-12-04 DIAGNOSIS — R1314 Dysphagia, pharyngoesophageal phase: Secondary | ICD-10-CM | POA: Diagnosis not present

## 2022-12-04 DIAGNOSIS — K640 First degree hemorrhoids: Secondary | ICD-10-CM | POA: Insufficient documentation

## 2022-12-04 DIAGNOSIS — J45909 Unspecified asthma, uncomplicated: Secondary | ICD-10-CM | POA: Diagnosis not present

## 2022-12-04 DIAGNOSIS — E119 Type 2 diabetes mellitus without complications: Secondary | ICD-10-CM | POA: Diagnosis not present

## 2022-12-04 DIAGNOSIS — K449 Diaphragmatic hernia without obstruction or gangrene: Secondary | ICD-10-CM | POA: Insufficient documentation

## 2022-12-04 DIAGNOSIS — K2970 Gastritis, unspecified, without bleeding: Secondary | ICD-10-CM

## 2022-12-04 DIAGNOSIS — K5909 Other constipation: Secondary | ICD-10-CM | POA: Insufficient documentation

## 2022-12-04 DIAGNOSIS — Z6841 Body Mass Index (BMI) 40.0 and over, adult: Secondary | ICD-10-CM | POA: Insufficient documentation

## 2022-12-04 DIAGNOSIS — E669 Obesity, unspecified: Secondary | ICD-10-CM | POA: Insufficient documentation

## 2022-12-04 DIAGNOSIS — G40909 Epilepsy, unspecified, not intractable, without status epilepticus: Secondary | ICD-10-CM | POA: Insufficient documentation

## 2022-12-04 DIAGNOSIS — I10 Essential (primary) hypertension: Secondary | ICD-10-CM | POA: Diagnosis not present

## 2022-12-04 DIAGNOSIS — M797 Fibromyalgia: Secondary | ICD-10-CM | POA: Insufficient documentation

## 2022-12-04 DIAGNOSIS — K319 Disease of stomach and duodenum, unspecified: Secondary | ICD-10-CM | POA: Diagnosis not present

## 2022-12-04 HISTORY — PX: BIOPSY: SHX5522

## 2022-12-04 HISTORY — PX: COLONOSCOPY WITH PROPOFOL: SHX5780

## 2022-12-04 HISTORY — PX: ESOPHAGOGASTRODUODENOSCOPY (EGD) WITH PROPOFOL: SHX5813

## 2022-12-04 LAB — GLUCOSE, CAPILLARY: Glucose-Capillary: 92 mg/dL (ref 70–99)

## 2022-12-04 SURGERY — COLONOSCOPY WITH PROPOFOL
Anesthesia: Monitor Anesthesia Care

## 2022-12-04 MED ORDER — LIDOCAINE 2% (20 MG/ML) 5 ML SYRINGE
INTRAMUSCULAR | Status: DC | PRN
  Administered 2022-12-04: 60 mg via INTRAVENOUS

## 2022-12-04 MED ORDER — DEXAMETHASONE SODIUM PHOSPHATE 10 MG/ML IJ SOLN
INTRAMUSCULAR | Status: DC | PRN
  Administered 2022-12-04: 4 mg via INTRAVENOUS

## 2022-12-04 MED ORDER — PROPOFOL 1000 MG/100ML IV EMUL
INTRAVENOUS | Status: AC
  Filled 2022-12-04: qty 100

## 2022-12-04 MED ORDER — PROPOFOL 10 MG/ML IV BOLUS
INTRAVENOUS | Status: DC | PRN
  Administered 2022-12-04: 200 mg via INTRAVENOUS

## 2022-12-04 MED ORDER — FENTANYL CITRATE (PF) 100 MCG/2ML IJ SOLN
INTRAMUSCULAR | Status: DC | PRN
  Administered 2022-12-04 (×2): 50 ug via INTRAVENOUS

## 2022-12-04 MED ORDER — FENTANYL CITRATE (PF) 100 MCG/2ML IJ SOLN
INTRAMUSCULAR | Status: AC
  Filled 2022-12-04: qty 2

## 2022-12-04 MED ORDER — ONDANSETRON HCL 4 MG/2ML IJ SOLN
INTRAMUSCULAR | Status: DC | PRN
  Administered 2022-12-04: 4 mg via INTRAVENOUS

## 2022-12-04 MED ORDER — PROPOFOL 500 MG/50ML IV EMUL
INTRAVENOUS | Status: AC
  Filled 2022-12-04: qty 50

## 2022-12-04 MED ORDER — SODIUM CHLORIDE 0.9 % IV SOLN: INTRAVENOUS | Status: DC

## 2022-12-04 MED ORDER — SUCCINYLCHOLINE CHLORIDE 200 MG/10ML IV SOSY
PREFILLED_SYRINGE | INTRAVENOUS | Status: DC | PRN
  Administered 2022-12-04: 140 mg via INTRAVENOUS

## 2022-12-04 MED ORDER — PHENYLEPHRINE 80 MCG/ML (10ML) SYRINGE FOR IV PUSH (FOR BLOOD PRESSURE SUPPORT)
PREFILLED_SYRINGE | INTRAVENOUS | Status: DC | PRN
  Administered 2022-12-04 (×2): 80 ug via INTRAVENOUS
  Administered 2022-12-04: 160 ug via INTRAVENOUS
  Administered 2022-12-04: 80 ug via INTRAVENOUS
  Administered 2022-12-04: 160 ug via INTRAVENOUS
  Administered 2022-12-04: 80 ug via INTRAVENOUS

## 2022-12-04 MED ORDER — LACTATED RINGERS IV SOLN: INTRAVENOUS | Status: DC

## 2022-12-04 SURGICAL SUPPLY — 25 items

## 2022-12-04 NOTE — Transfer of Care (Signed)
Immediate Anesthesia Transfer of Care Note  Patient: Virginia Luna  Procedure(s) Performed: COLONOSCOPY WITH PROPOFOL ESOPHAGOGASTRODUODENOSCOPY (EGD) WITH PROPOFOL BIOPSY  Patient Location: PACU  Anesthesia Type:General  Level of Consciousness: awake, alert , and oriented  Airway & Oxygen Therapy: Patient Spontanous Breathing and Patient connected to face mask oxygen  Post-op Assessment: Report given to RN, Post -op Vital signs reviewed and stable, and Patient moving all extremities X 4  Post vital signs: Reviewed and stable  Last Vitals:  Vitals Value Taken Time  BP 157/80   Temp    Pulse 94 12/04/22 0830  Resp 16 12/04/22 0830  SpO2 100 % 12/04/22 0830  Vitals shown include unvalidated device data.  Last Pain:  Vitals:   12/04/22 0703  TempSrc: Temporal  PainSc: 0-No pain         Complications: No notable events documented.

## 2022-12-04 NOTE — Discharge Instructions (Signed)

## 2022-12-04 NOTE — Anesthesia Postprocedure Evaluation (Signed)
Anesthesia Post Note  Patient: Virginia Luna  Procedure(s) Performed: COLONOSCOPY WITH PROPOFOL ESOPHAGOGASTRODUODENOSCOPY (EGD) WITH PROPOFOL BIOPSY     Patient location during evaluation: PACU Anesthesia Type: General Level of consciousness: awake and alert Pain management: pain level controlled Vital Signs Assessment: post-procedure vital signs reviewed and stable Respiratory status: spontaneous breathing, nonlabored ventilation and respiratory function stable Cardiovascular status: blood pressure returned to baseline and stable Postop Assessment: no apparent nausea or vomiting Anesthetic complications: no   No notable events documented.  Last Vitals:  Vitals:   12/04/22 0850 12/04/22 0900  BP: (!) 172/86 (!) 167/87  Pulse: 82 85  Resp: 15 14  Temp:    SpO2: 95% 94%    Last Pain:  Vitals:   12/04/22 0900  TempSrc:   PainSc: 0-No pain                 Beryle Lathe

## 2022-12-04 NOTE — H&P (Signed)
H&P  Subjective:  Virginia Luna is a 62 year old female who presents for EGD and colonoscopy.  She was seen in office by myself on 09/28/2022 for symptoms of dysphagia and chronic constipation.  She had CT imaging on 09/08/2022 with findings of small hiatal hernia, possible asymmetric thickening of rectal wall.  Last EGD 2017 showed medium size hiatal hernia, 2 nonbleeding linear gastric ulcers.  Last colonoscopy 2014 showed moderate internal hemorrhoids.  No family history colon cancer.  No anticoagulant use.  Has sleep apnea, wears CPAP intermittently.  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  Arrangements were made for EGD and colonoscopy in the hospital given her comorbidities of asthma and obesity.  Objective:  General: Awake and alert, nontoxic in appearance Cardio: Regular pulse appreciated Pulm: No conversational dyspnea, no supplemental oxygen in place Abdomen: Soft, nontender to palpation Lower extremity: No bilateral lower extremity edema  Assessment: -Dysphagia -Abnormal CT imaging  Plan: -I personally discussed EGD and colonoscopy procedures with patient, alternatives to therapy were discussed, risks of procedures including bleeding/infection/perforation/missed lesion/anesthesia were discussed, she verbalized understanding and wished to proceed. -Proceed to EGD and colonoscopy today -Further recommendations to follow pending endoscopy  Liliane Shi, DO [facility]

## 2022-12-04 NOTE — Brief Op Note (Signed)
12/04/2022  8:24 AM  PATIENT:  Virginia Luna  62 y.o. female  PRE-OPERATIVE DIAGNOSIS:  Esophageal dysphagia Chronic constipation  POST-OPERATIVE DIAGNOSIS: diffuse gastritis, gastric body abnormal mucosa, hiatal hernia, internal hemorrhoids  PROCEDURE:  Procedure(s): COLONOSCOPY WITH PROPOFOL (N/A) ESOPHAGOGASTRODUODENOSCOPY (EGD) WITH PROPOFOL (N/A) BIOPSY  SURGEON:  Surgeon(s) and Role:    Lynann Bologna, DO - Primary  Recommendations:  - Follow up pathology specimens from EGD - PPI therapy  - Repeat colonoscopy in 10 years pending overall health - Supportive hemorrhoidal care - Follow up with me in office for chronic constipation  Liliane Shi, DO [facility]

## 2022-12-04 NOTE — Op Note (Signed)
[facility] Patient Name: Virginia Luna Procedure Date: 12/04/2022 MRN: [id number] Attending MD: Liliane Shi DO, DO, [id number] Date of Birth: [birth year] CSN: [id number] Age: 62 Admit Type: Outpatient Procedure:                Colonoscopy Indications:              Abnormal CT of the GI tract Providers:                Liliane Shi DO, DO, Trudee Kuster, RN, Marja Kays, Technician Referring MD:              Medicines:                See the Anesthesia note for documentation of the                            administered medications Complications:            No immediate complications. Estimated Blood Loss:     Estimated blood loss: none. Procedure:                Pre-Anesthesia Assessment:                           - ASA Grade Assessment: III - A patient with severe                            systemic disease.                           - The risks and benefits of the procedure and the                            sedation options and risks were discussed with the                            patient. All questions were answered and informed                            consent was obtained.                           After obtaining informed consent, the colonoscope                            was passed under direct vision. Throughout the                            procedure, the patient's blood pressure, pulse, and                            oxygen saturations were monitored continuously. The                            CF-HQ190L ([id number] Olympus colonoscope was  introduced through the anus and advanced to the the                            terminal ileum. The colonoscopy was performed                            without difficulty. The patient tolerated the                            procedure well. The quality of the bowel                            preparation was evaluated using the BBPS [facility]                             Bowel Preparation Scale) with scores of: Right                            Colon = 2 (minor amount of residual staining, small                            fragments of stool and/or opaque liquid, but mucosa                            seen well), Transverse Colon = 3 (entire mucosa                            seen well with no residual staining, small                            fragments of stool or opaque liquid) and Left Colon                            = 3 (entire mucosa seen well with no residual                            staining, small fragments of stool or opaque                            liquid). The total BBPS score equals 8. The quality                            of the bowel preparation was good. Scope In: 8:04:06 AM Scope Out: 8:22:42 AM Scope Withdrawal Time: 0 hours 10 minutes 8 seconds  Total Procedure Duration: 0 hours 18 minutes 36 seconds  Findings:      The digital rectal exam findings include internal hemorrhoids (Grade I).      The colon (entire examined portion) appeared normal.      The terminal ileum appeared normal. Impression:               - Internal hemorrhoids (Grade I) found on digital  rectal exam.                           - The entire examined colon is normal.                           - The examined portion of the ileum was normal.                           - No specimens collected. Moderate Sedation:      See the other procedure note for documentation of moderate sedation with       intraservice time. Recommendation:           - Discharge patient to home.                           - Resume previous diet.                           - Continue present medications.                           - Repeat colonoscopy in 10 years for screening                            purposes.                           - Return to my office in 2 months. Procedure Code(s):        --- Professional ---                           [phone],  Colonoscopy, flexible; diagnostic, including                            collection of specimen(s) by brushing or washing,                            when performed (separate procedure) Diagnosis Code(s):        --- Professional ---                           K64.0, First degree hemorrhoids                           R93.3, Abnormal findings on diagnostic imaging of                            other parts of digestive tract CPT copyright 2022 American [employer]. All rights reserved. The codes documented in this report are preliminary and upon coder review may  be revised to meet current compliance requirements. Dr Liliane Shi, DO Liliane Shi DO, DO 12/04/2022 8:37:46 AM Number of Addenda: 0

## 2022-12-04 NOTE — Anesthesia Procedure Notes (Signed)
Procedure Name: Intubation Date/Time: 12/04/2022 7:44 AM  Performed by: Nelle Don, CRNAPre-anesthesia Checklist: Patient identified, Emergency Drugs available, Suction available and Patient being monitored Patient Re-evaluated:Patient Re-evaluated prior to induction Oxygen Delivery Method: Circle system utilized Preoxygenation: Pre-oxygenation with 100% oxygen Induction Type: IV induction and Rapid sequence Laryngoscope Size: Mac and 4 Grade View: Grade I Tube type: Oral Tube size: 7.0 mm Number of attempts: 1 Airway Equipment and Method: Stylet Placement Confirmation: ETT inserted through vocal cords under direct vision, positive ETCO2 and breath sounds checked- equal and bilateral Secured at: 23 cm Tube secured with: Tape Dental Injury: Teeth and Oropharynx as per pre-operative assessment

## 2022-12-04 NOTE — Op Note (Signed)
[facility] Patient Name: Virginia Luna Procedure Date: 12/04/2022 MRN: [id number] Attending MD: Liliane Shi DO, DO, [id number] Date of Birth: [birth year] CSN: [id number] Age: 62 Admit Type: Outpatient Procedure:                Upper GI endoscopy Indications:              Dysphagia Providers:                Liliane Shi DO, DO, Trudee Kuster, RN, Marja Kays, Technician Referring MD:              Medicines:                See the Anesthesia note for documentation of the                            administered medications Complications:            No immediate complications. Estimated Blood Loss:     Estimated blood loss was minimal. Procedure:                Pre-Anesthesia Assessment:                           - ASA Grade Assessment: III - A patient with severe                            systemic disease.                           - The risks and benefits of the procedure and the                            sedation options and risks were discussed with the                            patient. All questions were answered and informed                            consent was obtained.                           After obtaining informed consent, the endoscope was                            passed under direct vision. Throughout the                            procedure, the patient's blood pressure, pulse, and                            oxygen saturations were monitored continuously. The                            [id number]-[id number] ([id number]) Olympus endoscope was introduced  through the mouth, and advanced to the second part                            of duodenum. The upper GI endoscopy was                            accomplished without difficulty. The patient                            tolerated the procedure well. Scope In: Scope Out: Findings:      A small hiatal hernia was present.      Normal mucosa was found  in the entire esophagus. Biopsies were obtained       from the proximal and distal esophagus with cold forceps for histology       of suspected eosinophilic esophagitis.      Diffuse mild inflammation characterized by congestion (edema) and       erythema was found in the entire examined stomach. Biopsies were taken       with a cold forceps for Helicobacter pylori testing.      Localized moderate inflammation characterized by erythema, friability,       granularity and nodularity was found on the lesser curvature of the       stomach. Biopsies were taken with a cold forceps for histology.      The examined duodenum was normal. Impression:               - Small hiatal hernia.                           - Normal mucosa was found in the entire esophagus.                           - Gastritis. Biopsied.                           - Gastritis. Biopsied.                           - Normal examined duodenum.                           - Biopsies were taken with a cold forceps for                            evaluation of eosinophilic esophagitis. Moderate Sedation:      See the other procedure note for documentation of moderate sedation with       intraservice time. Recommendation:           - Await pathology results.                           - Continue present medications.                           - Perform a colonoscopy today. Procedure Code(s):        --- Professional ---  28413, Esophagogastroduodenoscopy, flexible,                            transoral; with biopsy, single or multiple Diagnosis Code(s):        --- Professional ---                           K44.9, Diaphragmatic hernia without obstruction or                            gangrene                           K29.70, Gastritis, unspecified, without bleeding                           R13.10, Dysphagia, unspecified CPT copyright 2022 American [employer] Association. All rights reserved. The codes documented in  this report are preliminary and upon coder review may  be revised to meet current compliance requirements. Dr Liliane Shi, DO Liliane Shi DO, DO 12/04/2022 8:34:41 AM Number of Addenda: 0

## 2022-12-05 LAB — SURGICAL PATHOLOGY

## 2022-12-06 DIAGNOSIS — J3089 Other allergic rhinitis: Secondary | ICD-10-CM | POA: Diagnosis not present

## 2022-12-06 DIAGNOSIS — J301 Allergic rhinitis due to pollen: Secondary | ICD-10-CM | POA: Diagnosis not present

## 2022-12-06 DIAGNOSIS — J3081 Allergic rhinitis due to animal (cat) (dog) hair and dander: Secondary | ICD-10-CM | POA: Diagnosis not present

## 2022-12-07 ENCOUNTER — Ambulatory Visit: Payer: Medicare HMO | Admitting: Neurology

## 2022-12-09 ENCOUNTER — Encounter (HOSPITAL_COMMUNITY): Payer: Self-pay | Admitting: Internal Medicine

## 2022-12-11 DIAGNOSIS — J3081 Allergic rhinitis due to animal (cat) (dog) hair and dander: Secondary | ICD-10-CM | POA: Diagnosis not present

## 2022-12-11 DIAGNOSIS — J301 Allergic rhinitis due to pollen: Secondary | ICD-10-CM | POA: Diagnosis not present

## 2022-12-11 DIAGNOSIS — J3089 Other allergic rhinitis: Secondary | ICD-10-CM | POA: Diagnosis not present

## 2022-12-14 DIAGNOSIS — N200 Calculus of kidney: Secondary | ICD-10-CM | POA: Diagnosis not present

## 2022-12-18 DIAGNOSIS — Z6841 Body Mass Index (BMI) 40.0 and over, adult: Secondary | ICD-10-CM | POA: Diagnosis not present

## 2022-12-18 DIAGNOSIS — J3081 Allergic rhinitis due to animal (cat) (dog) hair and dander: Secondary | ICD-10-CM | POA: Diagnosis not present

## 2022-12-18 DIAGNOSIS — J3089 Other allergic rhinitis: Secondary | ICD-10-CM | POA: Diagnosis not present

## 2022-12-18 DIAGNOSIS — I10 Essential (primary) hypertension: Secondary | ICD-10-CM | POA: Diagnosis not present

## 2022-12-18 DIAGNOSIS — E7800 Pure hypercholesterolemia, unspecified: Secondary | ICD-10-CM | POA: Diagnosis not present

## 2022-12-18 DIAGNOSIS — E1169 Type 2 diabetes mellitus with other specified complication: Secondary | ICD-10-CM | POA: Diagnosis not present

## 2022-12-18 DIAGNOSIS — J301 Allergic rhinitis due to pollen: Secondary | ICD-10-CM | POA: Diagnosis not present

## 2022-12-18 DIAGNOSIS — F4323 Adjustment disorder with mixed anxiety and depressed mood: Secondary | ICD-10-CM | POA: Diagnosis not present

## 2022-12-18 DIAGNOSIS — J0181 Other acute recurrent sinusitis: Secondary | ICD-10-CM | POA: Diagnosis not present

## 2022-12-24 NOTE — Progress Notes (Unsigned)
PATIENT: Virginia Luna DOB: 05-02-1961  REASON FOR VISIT: follow up HISTORY FROM: patient  No chief complaint on file.    HISTORY OF PRESENT ILLNESS:  12/24/22 ALL:  Davinah returns for follow up for OSA on CPAP. ONO 06/2022 showed O2 maintained on room air when using CPAP.   Dr Yetta Barre Dr Adriana Mccallum   01/01/2022 ALL: Virginia Luna is a 62 y.o. female here today for follow up for OSA on CPAP. She was seen in consult with Dr Vickey Huger 07/2021 for concerns of insomnia. PSG showed overall mild OSA with total AHI of 10.9/hr but with significant worsening in REM sleep (51.2/hr) with noted hypoxia. CPAP titration showed apnea well managed with set pressure of 13cmH20 using small Eson 2 nasal mask, however, hypoxia persistent up to 11cmH20 and did not include REM sleep. ONO advised after starting CPAP. She reports doing fairly well on CPAP. She states that she is using her machine every night for at least 4 hours, however, compliance report shows 87% daily usage and 17% four hour usage. She states that she has discussed concerns with DME but told that she needed to use her machine more. She is adamant that she is. She denies concerns with mask/supplies. She is not sure she feels any better when using CPAP. She states that she did not have a home ONO after starting CPAP.   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 She presented with worsening headaches and tremor. She is followed locally by Dr Yetta Barre. Dr Everlena Cooper sees her for management of headaches. Now on Botox and zonasimide added at last visit 12/29/2021. She continues outpatient rehab. She is ambulating with her walker.      HISTORY: (copied from Dr Dohmeier's previous note)  RAENGEL COLEE is a 62 y.o. AA female patient and was seen here as a new Consult patient , upon referral on 07/24/2021 from Dr Earlene Plater.  Chief concern according to patient :  Internal referral for insomnia due to mental  conditions. The patient is followed for non sleep issues by [facility], her primary neurologist.    The patietn weighted 289 pounds at her highest level and now is at 266 pounds. Her BMI remains at 50 .   I have the pleasure of seeing KENYANA LORA 07-24-2021, a right-handed Black or [place] American female with chronic insomnia- related to  pain. Onset in her twenties,    She  has a past medical history of Allergic Rhinitis , Anemia, OSTEO_Arthritis, childhood and allergic Asthma, Chronic pain, Complication of anesthesia, Diabetes (HCC), Diabetes mellitus without complication (HCC), Edema, lower extremity, Fibroid, Fibromyalgia, GERD (gastroesophageal reflux disease), Headache, High blood pressure, History of hiatal hernia, History of stomach ulcers, IBS (irritable bowel syndrome), Joint pain, Sleep apnea, and Wears contact lenses. Fall injuries with fracture of right wrist    The patient had the first sleep study in the year 2020  with a diagnosis of OSA, [facility].  she was placed on CPAP, but d/c after developing nausea. She reported aerophagia.  Nocturia/ Enuresis 2-3 times, Sleep walking and talking, no Tonsillectomy, cervical spine DDD, no surgery.    Family medical /sleep history:  Brother on CPAP with OSA prior to his bariatric surgery- , insomnia sleep walking in siblings. .    Social history: Patient is disabled from [employer],  and lives in a household alone. Has a constant companion.  Family status is single- never married-  Tobacco use; none.  ETOH use none,  Caffeine intake in form of Coffee( /) Soda( /) Tea ( 2 week) or energy drinks. Regular exercise in form of walking with friends.    Sleep habits are as follows: The patient's dinner time is between 6-6.30PM. The patient goes to bed at 9-10  PM and she struggles to sleep without medication ( this is a chronic condition and treated by Dr Tiburcio Pea, attributed to FIBROMYALGIA ) continues to sleep for 4-5  hours, wakes  for several bathroom breaks   The preferred sleep position is supine, with the support of 2-3 pillows. Severe GERD causes arousals and sometimes she will vomit at night- constant nausea and feeling of fullness.  She wakes sometimes feeling paralyzed, scared by these spell.  Dreams are reportedly rare. Used to have nightmares.  5.30  AM is the usual rise time. The patient wakes up spontaneously 5 AM-before an alarm. She reports not feeling refreshed or restored in AM, with symptoms such as dry mouth, morning headaches, and residual fatigue.  Naps are taken infrequently after lunch, lasting from 10 to 30 minutes and are more refreshing than nocturnal sleep.     REVIEW OF SYSTEMS: Out of a complete 14 system review of symptoms, the patient complains only of the following symptoms, headaches, imbalance, tremor and all other reviewed systems are negative.  ESS: 11/24  ALLERGIES: Allergies  Allergen Reactions   Rocephin [Ceftriaxone] Anaphylaxis   Morphine And Related Itching and Nausea And Vomiting    Doesn't work   Prednisone Itching and Swelling   Sulfa Antibiotics Itching and Swelling   Amitriptyline Other (See Comments)   Penicillin G Sodium Other (See Comments)    HOME MEDICATIONS: Outpatient Medications Prior to Visit  Medication Sig Dispense Refill   ABRYSVO 120 MCG/0.5ML injection      acetaminophen (TYLENOL) 325 MG tablet Take 1-2 tablets (325-650 mg total) by mouth every 4 (four) hours as needed for mild pain.     acetaminophen-codeine (TYLENOL #3) 300-30 MG tablet Take 1 tablet by mouth every 6 (six) hours. (Patient not taking: Reported on 12/03/2022)     albuterol (PROVENTIL) (2.5 MG/3ML) 0.083% nebulizer solution Take 2.5 mg by nebulization every 6 (six) hours as needed for wheezing or shortness of breath.     albuterol (VENTOLIN HFA) 108 (90 Base) MCG/ACT inhaler Inhale 2 puffs into the lungs every 4 (four) hours as needed.     amLODipine (NORVASC) 10 MG tablet Take 10 mg by  mouth daily.     atorvastatin (LIPITOR) 10 MG tablet Take 1 tablet (10 mg total) by mouth at bedtime. 30 tablet 0   azelastine (ASTELIN) 0.1 % nasal spray Place 1 spray into both nostrils 2 (two) times daily as needed for rhinitis. 30 mL 0   BISACODYL 5 MG EC tablet Take 10 mg by mouth 2 (two) times daily.     BREO ELLIPTA 200-25 MCG/ACT AEPB Inhale 1 puff into the lungs daily.     ciprofloxacin (CIPRO) 500 MG tablet Take 500 mg by mouth once.     COMIRNATY syringe      cyclobenzaprine (FLEXERIL) 10 MG tablet Take 20 mg by mouth at bedtime.     diclofenac (VOLTAREN) 75 MG EC tablet Take 75 mg by mouth 2 (two) times daily.     EPINEPHrine 0.3 mg/0.3 mL IJ SOAJ injection Inject 0.3 mg into the muscle as needed for anaphylaxis.     fluticasone (FLONASE) 50 MCG/ACT nasal spray Place 1 spray  into both nostrils in the morning and at bedtime.     furosemide (LASIX) 20 MG tablet Take 20 mg by mouth daily as needed for fluid.     gabapentin (NEURONTIN) 300 MG capsule Take 300 mg by mouth 2 (two) times daily.     HYDROcodone-acetaminophen (NORCO/VICODIN) 5-325 MG tablet Take 3 tablets by mouth at bedtime.     levocetirizine (XYZAL) 5 MG tablet Take 1 tablet (5 mg total) by mouth every evening. 30 tablet 0   lidocaine-prilocaine (EMLA) cream Apply 1 Application topically as needed. 30 g 0   LINZESS 145 MCG CAPS capsule Take 145 mcg by mouth every morning.     meclizine (ANTIVERT) 25 MG tablet Take 1 tablet (25 mg total) by mouth 3 (three) times daily as needed for dizziness. 60 tablet 0   melatonin 5 MG TABS Take 1 tablet (5 mg total) by mouth at bedtime. 30 tablet 0   methocarbamol (ROBAXIN) 500 MG tablet Take 750 mg by mouth every 8 (eight) hours as needed for muscle spasms.     montelukast (SINGULAIR) 10 MG tablet Take 1 tablet (10 mg total) by mouth at bedtime. 30 tablet 0   NON FORMULARY Pt uses a cpap nightly     ondansetron (ZOFRAN-ODT) 4 MG disintegrating tablet TAKE ONE TABLET BY MOUTH every  EIGHT hours AS NEEDED FOR NAUSEA/VOMITING 90 tablet 2   ondansetron (ZOFRAN-ODT) 8 MG disintegrating tablet Take 8 mg by mouth every 8 (eight) hours as needed.     Oxcarbazepine (TRILEPTAL) 300 MG tablet Take 3 tablets (900 mg total) by mouth at bedtime. For nerve pain 270 tablet 1   oxybutynin (DITROPAN) 5 MG tablet Take 1 tablet (5 mg total) by mouth every 8 (eight) hours as needed for bladder spasms. (Patient taking differently: Take 5 mg by mouth daily.) 30 tablet 0   pantoprazole (PROTONIX) 40 MG tablet Take 1 tablet (40 mg total) by mouth daily. (Patient taking differently: Take 40 mg by mouth in the morning and at bedtime.) 30 tablet 0   polyethylene glycol (MIRALAX / GLYCOLAX) 17 g packet Take 17 g by mouth daily as needed for mild constipation. 14 each 0   polyethylene glycol-electrolytes (NULYTELY) 420 g solution Take 4,000 mLs by mouth as directed.     QUEtiapine (SEROQUEL) 300 MG tablet Take 1 tablet (300 mg total) by mouth at bedtime. 90 tablet 3   rizatriptan (MAXALT) 10 MG tablet Take 10 mg by mouth daily as needed for migraine.     Semaglutide,0.25 or 0.5MG /DOS, (OZEMPIC, 0.25 OR 0.5 MG/DOSE,) 2 MG/1.5ML SOPN Inject 0.5 mg into the skin once a week. (Patient taking differently: Inject 0.5 mg into the skin every Sunday.) 1.5 mL 0   senna-docusate (SENOKOT-S) 8.6-50 MG tablet Take 2 tablets by mouth at bedtime. 60 tablet 0   tamsulosin (FLOMAX) 0.4 MG CAPS capsule Take 1 capsule (0.4 mg total) by mouth at bedtime. 30 capsule 0   Tetrahydrozoline HCl (VISINE OP) Place 1 drop into both eyes 2 (two) times daily as needed (dry eyes).     Vitamin D, Ergocalciferol, (DRISDOL) 1.25 MG (50000 UNIT) CAPS capsule TAKE ONE CAPSULE BY MOUTH EVERY 7 DAYS (Patient taking differently: Take 50,000 Units by mouth every Sunday.) 4 capsule 0   zolpidem (AMBIEN) 10 MG tablet Take 1 tablet (10 mg total) by mouth at bedtime. 30 tablet 5   zonisamide (ZONEGRAN) 100 MG capsule Take 2 capsules (200 mg total) by  mouth daily. 30 capsule 5  No facility-administered medications prior to visit.    PAST MEDICAL HISTORY: Past Medical History:  Diagnosis Date   Allergies    Anemia    Arthritis    Asthma    Back pain    Chronic pain    Diabetes (HCC)    Diabetes mellitus without complication (HCC)    Edema, lower extremity    Fibroid    Fibromyalgia    Chronic   GERD (gastroesophageal reflux disease)    Headache    migraines   Hepatic steatosis    High blood pressure    History of hiatal hernia    History of kidney stones    History of stomach ulcers    IBS (irritable bowel syndrome)    Joint pain    Sleep apnea    did use cpap-lost 25lb-says she does not need it   NO CPAP   Wears contact lenses     PAST SURGICAL HISTORY: Past Surgical History:  Procedure Laterality Date   BIOPSY  12/04/2022   Procedure: BIOPSY;  Surgeon: Lynann Bologna, DO;  Location: [facility];  Service: Gastroenterology;;   BRAIN SURGERY Left 11/07/2021   [facility] Dr. Clydene Pugh   BREAST BIOPSY     BREAST EXCISIONAL BIOPSY     BREAST LUMPECTOMY WITH RADIOACTIVE SEED LOCALIZATION Bilateral 11/24/2014   Procedure: BILATERAL BREAST LUMPECTOMY WITH RADIOACTIVE SEED LOCALIZATION;  Surgeon: Harriette Bouillon, MD;  Location: [facility];  Service: General;  Laterality: Bilateral;   CHOLECYSTECTOMY     COLONOSCOPY     COLONOSCOPY WITH PROPOFOL N/A 12/04/2022   Procedure: COLONOSCOPY WITH PROPOFOL;  Surgeon: Lynann Bologna, DO;  Location: [facility];  Service: Gastroenterology;  Laterality: N/A;   CYSTOSCOPY WITH RETROGRADE PYELOGRAM, URETEROSCOPY AND STENT PLACEMENT Right 10/16/2022   Procedure: CYSTOSCOPY WITH RETROGRADE PYELOGRAM, URETEROSCOPY AND STENT PLACEMENT;  Surgeon: Noel Christmas, MD;  Location: [facility];  Service: Urology;  Laterality: Right;   DILATION AND CURETTAGE OF UTERUS     ESOPHAGOGASTRODUODENOSCOPY (EGD) WITH PROPOFOL N/A 07/23/2016   Procedure: ESOPHAGOGASTRODUODENOSCOPY  (EGD) WITH PROPOFOL;  Surgeon: Carman Ching, MD;  Location: [facility];  Service: Endoscopy;  Laterality: N/A;   ESOPHAGOGASTRODUODENOSCOPY (EGD) WITH PROPOFOL N/A 06/11/2018   Procedure: ESOPHAGOGASTRODUODENOSCOPY (EGD) WITH PROPOFOL;  Surgeon: Carman Ching, MD;  Location: [facility];  Service: Endoscopy;  Laterality: N/A;   ESOPHAGOGASTRODUODENOSCOPY (EGD) WITH PROPOFOL N/A 12/04/2022   Procedure: ESOPHAGOGASTRODUODENOSCOPY (EGD) WITH PROPOFOL;  Surgeon: Lynann Bologna, DO;  Location: [facility];  Service: Gastroenterology;  Laterality: N/A;   HOLMIUM LASER APPLICATION Right 10/16/2022   Procedure: HOLMIUM LASER APPLICATION;  Surgeon: Noel Christmas, MD;  Location: [facility];  Service: Urology;  Laterality: Right;   LUMBAR LAMINECTOMY  2010   ORIF WRIST FRACTURE Right 11/24/2020   Procedure: OPEN REDUCTION INTERNAL FIXATION RIGHT WRIST FRACTURE;  Surgeon: Sheral Apley, MD;  Location: [facility];  Service: Orthopedics;  Laterality: Right;   OVARIAN CYST SURGERY     UMBILICAL HERNIA REPAIR     age 4   [facility]      FAMILY HISTORY: Family History  Problem Relation Age of Onset   Obesity Mother    Diabetes Father    High blood pressure Father    Sudden death Father    Breast cancer Paternal Grandmother    Breast cancer Paternal Aunt     SOCIAL HISTORY: Social History   Socioeconomic History   Marital status: Single    Spouse name: Not on  file   Number of children: Not on file   Years of education: Not on file   Highest education level: Some college, no degree  Occupational History   Occupation: disabled    Comment: volunteer at daycare  Tobacco Use   Smoking status: Never   Smokeless tobacco: Never  Vaping Use   Vaping Use: Never used  Substance and Sexual Activity   Alcohol use: No   Drug use: No   Sexual activity: Not Currently  Other Topics Concern   Not on file  Social History Narrative   Lives w roommate   Right handed   Caffeine: 2 cups  of tea a week. 2 sodas a week   Social Determinants of Corporate investment banker Strain: Not on file  Food Insecurity: Not on file  Transportation Needs: Not on file  Physical Activity: Not on file  Stress: Not on file  Social Connections: Not on file  Intimate Partner Violence: Not on file     PHYSICAL EXAM  There were no vitals filed for this visit.  There is no height or weight on file to calculate BMI.  Generalized: Well developed, in no acute distress  Cardiology: normal rate and rhythm, no murmur noted Respiratory: clear to auscultation bilaterally  Neurological examination  Mentation: Alert oriented to time, place, history taking. Follows all commands speech and language fluent Cranial nerve II-XII: Pupils were equal round reactive to light. Extraocular movements were full, visual field were full  Motor: The motor testing reveals 5 over 5 strength of all 4 extremities. Good symmetric motor tone is noted throughout.  Gait and station: Gait is normal.    DIAGNOSTIC DATA (LABS, IMAGING, TESTING) - I reviewed patient records, labs, notes, testing and imaging myself where available.      No data to display           Lab Results  Component Value Date   WBC 7.3 09/28/2022   HGB 11.2 (L) 09/28/2022   HCT 37.3 09/28/2022   MCV 88.4 09/28/2022   PLT 408 (H) 09/28/2022      Component Value Date/Time   NA 144 09/28/2022 1111   NA 143 09/05/2021 1523   K 3.3 (L) 09/28/2022 1111   CL 108 09/28/2022 1111   CO2 26 09/28/2022 1111   GLUCOSE 103 (H) 09/28/2022 1111   BUN 16 09/28/2022 1111   BUN 15 09/05/2021 1523   CREATININE 0.50 09/28/2022 1111   CALCIUM 9.2 09/28/2022 1111   PROT 7.9 09/28/2022 1111   PROT 7.3 09/05/2021 1523   ALBUMIN 4.5 09/28/2022 1111   ALBUMIN 4.6 09/05/2021 1523   AST 17 09/28/2022 1111   ALT 30 09/28/2022 1111   ALKPHOS 145 (H) 09/28/2022 1111   BILITOT 0.5 09/28/2022 1111   BILITOT 0.2 09/05/2021 1523   GFRNONAA >60 09/28/2022  1111   GFRAA 116 10/12/2020 1624   Lab Results  Component Value Date   CHOL 139 02/14/2021   HDL 70 02/14/2021   LDLCALC 56 02/14/2021   TRIG 66 02/14/2021   CHOLHDL 2.0 02/14/2021   Lab Results  Component Value Date   HGBA1C 6.2 (H) 09/28/2022   Lab Results  Component Value Date   VITAMINB12 312 11/20/2021   Lab Results  Component Value Date   TSH 0.458 02/14/2021     ASSESSMENT AND PLAN 62 y.o. year old female  has a past medical history of Allergies, Anemia, Arthritis, Asthma, Back pain, Chronic pain, Diabetes (HCC), Diabetes mellitus without complication (  HCC), Edema, lower extremity, Fibroid, Fibromyalgia, GERD (gastroesophageal reflux disease), Headache, Hepatic steatosis, High blood pressure, History of hiatal hernia, History of kidney stones, History of stomach ulcers, IBS (irritable bowel syndrome), Joint pain, Sleep apnea, and Wears contact lenses. here with   No diagnosis found.    Leeasia A [place] is doing well on CPAP therapy. Compliance report reveals acceptable daily but sub optimal 4 hour compliance. She is adamant that her machine is not operating correctly and she is using it "all night". I have placed orders for DME to help troubleshoot any possible malfunctioning in registering her usage time. I will also place order for ONO while using CPAP. She was encouraged to continue using CPAP nightly and for greater than 4 hours each night. We will update supply orders as indicated. Risks of untreated sleep apnea review and education materials provided. Healthy lifestyle habits encouraged. She will follow up with Dr Vickey Huger to review ONO on CPAP with possible titration study needed in  4-6 months, sooner if needed. She verbalizes understanding and agreement with this plan.    No orders of the defined types were placed in this encounter.    No orders of the defined types were placed in this encounter.     Shawnie Dapper, FNP-C 12/24/2022, 1:10 PM [facility] [address], [address] [place], [place] [address] [phone]

## 2022-12-24 NOTE — Patient Instructions (Incomplete)
Please continue using your CPAP regularly. While your insurance requires that you use CPAP at least 4 hours each night on 70% of the nights, I recommend, that you not skip any nights and use it throughout the night if you can. Getting used to CPAP and staying with the treatment long term does take time and patience and discipline. Untreated obstructive sleep apnea when it is moderate to severe can have an adverse impact on cardiovascular health and raise her risk for heart disease, arrhythmias, hypertension, congestive heart failure, stroke and diabetes. Untreated obstructive sleep apnea causes sleep disruption, nonrestorative sleep, and sleep deprivation. This can have an impact on your day to day functioning and cause daytime sleepiness and impairment of cognitive function, memory loss, mood disturbance, and problems focussing. Using CPAP regularly can improve these symptoms.  We will update supply orders, today. Please try to resume use. Your oxygen stays too low when you are not using your CPAP therapy but is good when you are using it.   Follow up in 4 months

## 2022-12-25 ENCOUNTER — Encounter: Payer: Self-pay | Admitting: Family Medicine

## 2022-12-25 ENCOUNTER — Ambulatory Visit: Payer: Medicare HMO | Admitting: Family Medicine

## 2022-12-25 VITALS — BP 163/86 | HR 88 | Ht 62.0 in | Wt 253.0 lb

## 2022-12-25 DIAGNOSIS — J302 Other seasonal allergic rhinitis: Secondary | ICD-10-CM | POA: Diagnosis not present

## 2022-12-25 DIAGNOSIS — G4733 Obstructive sleep apnea (adult) (pediatric): Secondary | ICD-10-CM

## 2022-12-25 DIAGNOSIS — G43709 Chronic migraine without aura, not intractable, without status migrainosus: Secondary | ICD-10-CM

## 2022-12-25 DIAGNOSIS — Z789 Other specified health status: Secondary | ICD-10-CM

## 2022-12-25 DIAGNOSIS — D432 Neoplasm of uncertain behavior of brain, unspecified: Secondary | ICD-10-CM | POA: Diagnosis not present

## 2022-12-26 ENCOUNTER — Encounter: Payer: Self-pay | Admitting: Family Medicine

## 2022-12-26 DIAGNOSIS — J3081 Allergic rhinitis due to animal (cat) (dog) hair and dander: Secondary | ICD-10-CM | POA: Diagnosis not present

## 2022-12-26 DIAGNOSIS — J3089 Other allergic rhinitis: Secondary | ICD-10-CM | POA: Diagnosis not present

## 2022-12-26 DIAGNOSIS — J301 Allergic rhinitis due to pollen: Secondary | ICD-10-CM | POA: Diagnosis not present

## 2022-12-28 ENCOUNTER — Ambulatory Visit: Payer: Medicare HMO | Admitting: Neurology

## 2023-01-02 ENCOUNTER — Telehealth: Payer: Self-pay | Admitting: Psychiatry

## 2023-01-02 NOTE — Telephone Encounter (Signed)
Pt called at 11:30a.  She said [facility] is going to call us because she is requesting an early refill.  She said she has 3 more pils of Ambien and the refill isn't due until next week.  She said the reason she runs out early is because she has a hernia and throws up a lot.  She takes her meds and sometimes if she throws up, she will wait an hour or so to see if the medicine will kick in, but if it doesn't she ends up taking another Ambien.  That's why she is coming up short.  She wants someone to call her if you need further explanation.  Next appt 1/10

## 2023-01-02 NOTE — Telephone Encounter (Signed)
[facility] 4/18, due 5/16.

## 2023-01-02 NOTE — Telephone Encounter (Signed)
Sorry I forgot to include in initial message, she wants the Seroquel also.  She said the Ambien and Seroquel combination together is what works for her.

## 2023-01-03 ENCOUNTER — Ambulatory Visit
Admission: RE | Admit: 2023-01-03 | Discharge: 2023-01-03 | Disposition: A | Discharge status: Home / Self Care | Payer: Medicare HMO | Source: Ambulatory Visit | Attending: Family Medicine | Admitting: Family Medicine

## 2023-01-03 DIAGNOSIS — Z1231 Encounter for screening mammogram for malignant neoplasm of breast: Secondary | ICD-10-CM

## 2023-01-03 DIAGNOSIS — J3081 Allergic rhinitis due to animal (cat) (dog) hair and dander: Secondary | ICD-10-CM | POA: Diagnosis not present

## 2023-01-03 DIAGNOSIS — J301 Allergic rhinitis due to pollen: Secondary | ICD-10-CM | POA: Diagnosis not present

## 2023-01-03 DIAGNOSIS — J3089 Other allergic rhinitis: Secondary | ICD-10-CM | POA: Diagnosis not present

## 2023-01-04 NOTE — Telephone Encounter (Signed)
I had not heard from [facility]. I contacted them today and medications will be delivered today.

## 2023-01-08 DIAGNOSIS — J301 Allergic rhinitis due to pollen: Secondary | ICD-10-CM | POA: Diagnosis not present

## 2023-01-08 DIAGNOSIS — J3081 Allergic rhinitis due to animal (cat) (dog) hair and dander: Secondary | ICD-10-CM | POA: Diagnosis not present

## 2023-01-08 DIAGNOSIS — J3089 Other allergic rhinitis: Secondary | ICD-10-CM | POA: Diagnosis not present

## 2023-01-09 ENCOUNTER — Ambulatory Visit: Payer: Medicare HMO | Admitting: Neurology

## 2023-01-15 DIAGNOSIS — J301 Allergic rhinitis due to pollen: Secondary | ICD-10-CM | POA: Diagnosis not present

## 2023-01-15 DIAGNOSIS — J3081 Allergic rhinitis due to animal (cat) (dog) hair and dander: Secondary | ICD-10-CM | POA: Diagnosis not present

## 2023-01-15 DIAGNOSIS — J3089 Other allergic rhinitis: Secondary | ICD-10-CM | POA: Diagnosis not present

## 2023-01-18 ENCOUNTER — Encounter: Payer: Medicare HMO | Attending: Physical Medicine and Rehabilitation | Admitting: Physical Medicine and Rehabilitation

## 2023-01-18 ENCOUNTER — Encounter: Payer: Self-pay | Admitting: Physical Medicine and Rehabilitation

## 2023-01-18 VITALS — BP 146/89 | HR 77 | Ht 62.0 in | Wt 249.0 lb

## 2023-01-18 DIAGNOSIS — G43E19 Chronic migraine with aura, intractable, without status migrainosus: Secondary | ICD-10-CM

## 2023-01-18 DIAGNOSIS — M797 Fibromyalgia: Secondary | ICD-10-CM | POA: Diagnosis not present

## 2023-01-18 DIAGNOSIS — Z6841 Body Mass Index (BMI) 40.0 and over, adult: Secondary | ICD-10-CM

## 2023-01-18 DIAGNOSIS — M7918 Myalgia, other site: Secondary | ICD-10-CM | POA: Diagnosis not present

## 2023-01-18 DIAGNOSIS — G894 Chronic pain syndrome: Secondary | ICD-10-CM

## 2023-01-18 MED ORDER — LIDOCAINE HCL 1 % IJ SOLN
6.0000 mL | Freq: Once | INTRAMUSCULAR | Status: AC
Start: 2023-01-18 — End: 2023-01-18
  Administered 2023-01-18: 6 mL

## 2023-01-18 NOTE — Progress Notes (Signed)
Virginia Luna is a 62 yr old female with hx of fibromyalgia- dx'd at age 70, DM2,- Ac1 6.2;  asthma; HTN, OSA, BMI is 50; Nodule in L superior cerebellum. Also has migraines- intractable and daily; has trace aura;  No kidney issues- Cr 0.65 and BUN 15 in 6/22. Also end stage OA of B/L knees medially and R hip pain. Also has myofascial pain syndrome.  Has new brain mass that was documented Worsening cerebellar Sx's. 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        Virginia Luna reports doing pretty well Accepted that needs to go to places on pwn.   Trying to do CPAP more regularly- wearing nightly, but sometimes will take off and then puts back on.   HA's booming all the time.   On Maxalt for when has migraines- but doesn't appear to be on meds for prevention of migraines.   Sees Dr Everlena Cooper soon- almost time for appt and for head scan.   Now going to bed pretty early to get relief.    Has some weight loss- down to 249 lbs!!! Due for Botox- on June 14th  Plan: Can see if can try Topamax since having HA's every day. Sounds like having migraines- but already on Zonegran 200 mg daily- so cannot prescribe.   Already getting Botox for prevention as well. So don't want to add meds without asking- Insurance wouldn't cover Emgality, etc-  3. Probably due soon for MRI of head as well to f/u on brain tumor.   4. Patient here for trigger point injections for myofascial pain and migraines  Consent done and on chart.  Cleaned areas with alcohol and injected using a 27 gauge 1.5 inch needle  Injected  6 cc-none wasted Using 1% Lidocaine with no EPI  Upper traps B/L x2 Levators- b/L  Posterior scalenes- b/L x2 Middle scalenes- B/L  Splenius Capitus- b?L  Pectoralis Major- b/L  Rhomboids B/L  Infraspinatus Teres Major/minor Thoracic paraspinals Lumbar paraspinals- b?L  Other injections-    Patient's level of  pain prior was close to 10/10 Current level of pain after injections is- 8.5/10  There was no bleeding or complications.  Patient was advised to drink a lot of water on day after injections to flush system Will have increased soreness for 12-48 hours after injections.  Can use Lidocaine patches the day AFTER injections Can use theracane on day of injections in places didn't inject Can use heating pad 4-6 hours AFTER injections  5. F/U in 3months- TrP injections and f/u on cerebellar tumor/HA's and FMS   I spent a total of  31  minutes on total care today- >50% coordination of care- due to 10 minutes on injections and f/u on migraines and pain as detailed above. Marland Kitchen

## 2023-01-18 NOTE — Patient Instructions (Addendum)
Plan: Can see if can try Topamax since having HA's every day. Sounds like having migraines- but already on Zonegran 200 mg daily- so cannot prescribe.   Already getting Botox for prevention as well. So don't want to add meds without asking- Insurance wouldn't cover Emgality, etc-  3. Probably due soon for MRI of head as well to f/u on brain tumor.   4. Patient here for trigger point injections for myofascial pain and migraines  Consent done and on chart.  Cleaned areas with alcohol and injected using a 27 gauge 1.5 inch needle  Injected  6 cc-none wasted Using 1% Lidocaine with no EPI  Upper traps B/L x2 Levators- b/L  Posterior scalenes- b/L x2 Middle scalenes- B/L  Splenius Capitus- b?L  Pectoralis Major- b/L  Rhomboids B/L  Infraspinatus Teres Major/minor Thoracic paraspinals Lumbar paraspinals- b?L  Other injections-    Patient's level of pain prior was close to 10/10 Current level of pain after injections is- 8.5/10  There was no bleeding or complications.  Patient was advised to drink a lot of water on day after injections to flush system Will have increased soreness for 12-48 hours after injections.  Can use Lidocaine patches the day AFTER injections Can use theracane on day of injections in places didn't inject Can use heating pad 4-6 hours AFTER injections  5. F/U in 2months- TrP injections and f/u on cerebellar tumor/HA's and FMS

## 2023-01-22 DIAGNOSIS — J301 Allergic rhinitis due to pollen: Secondary | ICD-10-CM | POA: Diagnosis not present

## 2023-01-22 DIAGNOSIS — J3089 Other allergic rhinitis: Secondary | ICD-10-CM | POA: Diagnosis not present

## 2023-01-22 DIAGNOSIS — J3081 Allergic rhinitis due to animal (cat) (dog) hair and dander: Secondary | ICD-10-CM | POA: Diagnosis not present

## 2023-01-29 ENCOUNTER — Other Ambulatory Visit: Payer: Self-pay

## 2023-01-29 DIAGNOSIS — J3081 Allergic rhinitis due to animal (cat) (dog) hair and dander: Secondary | ICD-10-CM | POA: Diagnosis not present

## 2023-01-29 DIAGNOSIS — J301 Allergic rhinitis due to pollen: Secondary | ICD-10-CM | POA: Diagnosis not present

## 2023-01-29 DIAGNOSIS — G43709 Chronic migraine without aura, not intractable, without status migrainosus: Secondary | ICD-10-CM

## 2023-01-29 DIAGNOSIS — J4540 Moderate persistent asthma, uncomplicated: Secondary | ICD-10-CM | POA: Diagnosis not present

## 2023-01-29 DIAGNOSIS — J3089 Other allergic rhinitis: Secondary | ICD-10-CM | POA: Diagnosis not present

## 2023-01-29 MED ORDER — ONABOTULINUMTOXINA 200 UNITS IJ SOLR
INTRAMUSCULAR | 4 refills | Status: DC
Start: 2023-01-29 — End: 2024-01-28

## 2023-02-01 ENCOUNTER — Ambulatory Visit: Payer: Medicare HMO | Admitting: Neurology

## 2023-02-01 DIAGNOSIS — D496 Neoplasm of unspecified behavior of brain: Secondary | ICD-10-CM

## 2023-02-01 DIAGNOSIS — G43709 Chronic migraine without aura, not intractable, without status migrainosus: Secondary | ICD-10-CM

## 2023-02-01 MED ORDER — ONABOTULINUMTOXINA 100 UNITS IJ SOLR
200.0000 [IU] | Freq: Once | INTRAMUSCULAR | Status: AC
Start: 2023-02-01 — End: 2023-02-01
  Administered 2023-02-01: 155 [IU] via INTRAMUSCULAR

## 2023-02-01 NOTE — Progress Notes (Signed)
[facility]  ° °Procedure Note Botox ° °Attending: Dr. Saralee Bolick ° °Preoperative Diagnosis(es): Chronic migraine ° °Consent obtained from: The patient °Benefits discussed included, but were not limited to decreased muscle tightness, increased joint range of motion, and decreased pain.  Risk discussed included, but were not limited pain and discomfort, bleeding, bruising, excessive weakness, venous thrombosis, muscle atrophy and dysphagia.  Anticipated outcomes of the procedure as well as he risks and benefits of the alternatives to the procedure, and the roles and tasks of the personnel to be involved, were discussed with the patient, and the patient consents to the procedure and agrees to proceed. A copy of the patient medication guide was given to the patient which explains the blackbox warning. ° °Patients identity and treatment sites confirmed Yes.  . ° °Details of Procedure: °Skin was cleaned with alcohol. Prior to injection, the needle plunger was aspirated to make sure the needle was not within a blood vessel.  There was no blood retrieved on aspiration.   ° °Following is a summary of the muscles injected  And the amount of Botulinum toxin used: ° °Dilution °200 units of Botox was reconstituted with 4 ml of preservative free normal saline. °Time of reconstitution: At the time of the office visit (<30 minutes prior to injection)  ° °Injections  °155 total units of Botox was injected with a 30 gauge needle. ° °Injection Sites: °L occipitalis: 15 units- 3 sites  °R occiptalis: 15 units- 3 sites ° °L upper trapezius: 15 units- 3 sites °R upper trapezius: 15 units- 3 sits          °L paraspinal: 10 units- 2 sites °R paraspinal: 10 units- 2 sites ° °Face °L frontalis(2 injection sites):10 units   °R frontalis(2 injection sites):10 units         °L corrugator: 5 units   °R corrugator: 5 units           °Procerus: 5 units   °L temporalis: 20 units °R temporalis: 20 units  ° °Agent:  °200 units of botulinum Type  A (Onobotulinum Toxin type A) was reconstituted with 4 ml of preservative free normal saline.  °Time of reconstitution: At the time of the office visit (<30 minutes prior to injection)  ° ° ° Total injected (Units):  155 ° Total wasted (Units):  45 ° °Patient tolerated procedure well without complications.   °Reinjection is anticipated in 3 months. ° ° °

## 2023-02-05 DIAGNOSIS — E1169 Type 2 diabetes mellitus with other specified complication: Secondary | ICD-10-CM | POA: Diagnosis not present

## 2023-02-05 DIAGNOSIS — J3081 Allergic rhinitis due to animal (cat) (dog) hair and dander: Secondary | ICD-10-CM | POA: Diagnosis not present

## 2023-02-05 DIAGNOSIS — J3089 Other allergic rhinitis: Secondary | ICD-10-CM | POA: Diagnosis not present

## 2023-02-05 DIAGNOSIS — Z6841 Body Mass Index (BMI) 40.0 and over, adult: Secondary | ICD-10-CM | POA: Diagnosis not present

## 2023-02-05 DIAGNOSIS — J301 Allergic rhinitis due to pollen: Secondary | ICD-10-CM | POA: Diagnosis not present

## 2023-02-05 DIAGNOSIS — R632 Polyphagia: Secondary | ICD-10-CM | POA: Diagnosis not present

## 2023-02-05 DIAGNOSIS — F4329 Adjustment disorder with other symptoms: Secondary | ICD-10-CM | POA: Diagnosis not present

## 2023-02-05 DIAGNOSIS — G8929 Other chronic pain: Secondary | ICD-10-CM | POA: Diagnosis not present

## 2023-02-06 ENCOUNTER — Encounter: Payer: Self-pay | Admitting: Family Medicine

## 2023-02-06 ENCOUNTER — Ambulatory Visit: Payer: Medicare HMO | Admitting: Neurology

## 2023-02-12 DIAGNOSIS — J3089 Other allergic rhinitis: Secondary | ICD-10-CM | POA: Diagnosis not present

## 2023-02-12 DIAGNOSIS — M797 Fibromyalgia: Secondary | ICD-10-CM | POA: Diagnosis not present

## 2023-02-12 DIAGNOSIS — E7800 Pure hypercholesterolemia, unspecified: Secondary | ICD-10-CM | POA: Diagnosis not present

## 2023-02-12 DIAGNOSIS — E119 Type 2 diabetes mellitus without complications: Secondary | ICD-10-CM | POA: Diagnosis not present

## 2023-02-12 DIAGNOSIS — G894 Chronic pain syndrome: Secondary | ICD-10-CM | POA: Diagnosis not present

## 2023-02-12 DIAGNOSIS — I10 Essential (primary) hypertension: Secondary | ICD-10-CM | POA: Diagnosis not present

## 2023-02-12 DIAGNOSIS — J3081 Allergic rhinitis due to animal (cat) (dog) hair and dander: Secondary | ICD-10-CM | POA: Diagnosis not present

## 2023-02-12 DIAGNOSIS — Z9181 History of falling: Secondary | ICD-10-CM | POA: Diagnosis not present

## 2023-02-12 DIAGNOSIS — Z6841 Body Mass Index (BMI) 40.0 and over, adult: Secondary | ICD-10-CM | POA: Diagnosis not present

## 2023-02-12 DIAGNOSIS — G43909 Migraine, unspecified, not intractable, without status migrainosus: Secondary | ICD-10-CM | POA: Diagnosis not present

## 2023-02-12 DIAGNOSIS — J4520 Mild intermittent asthma, uncomplicated: Secondary | ICD-10-CM | POA: Diagnosis not present

## 2023-02-12 DIAGNOSIS — J301 Allergic rhinitis due to pollen: Secondary | ICD-10-CM | POA: Diagnosis not present

## 2023-02-13 NOTE — Progress Notes (Unsigned)
NEUROLOGY FOLLOW UP OFFICE NOTE  SHAYANNA THATCH [id number]  Assessment/Plan:   1.Vestibular migraine/chronic migraine without aura, without status migrainosus, not intractable 2. Cerebellar hemangioblastoma, grade 1 status post resection  3. Depression and anxiety  4  Black out spell (she zoned out) - unclear etiology.  While in rehab, she had episode of shaking which appeared to be restlessness in bed possibly due to her apnea.  No loss of consciousness.  Semiology not likely to be seizure.   Migraine prevention:  Botox. Every 3 months, zonidamide 200mg  daily Rizatriptan 10mg  for migraine prevention Limit use of pain relievers to no more than 2 days out of week to prevent risk of rebound or medication-overuse headache. Keep headache diary Advised following up with psychiatry and psychology. Follow up for Botox     Subjective:  Alante Weimann is a 62 year old right-handed female with cerebellar hemangioblastoma, OSA, diabetes, chronic back pain, and IBS who follows up for migraine.   UPDATE: She is under a lot of stress and generalized pain.  Pain is limiing movement of her left shoulder.  This has aggravated her headaches and migraines.  Going to PT to help with balance.  No recent episodes of black out spells.   Current NSAIDS/analgesics:  Hydrocodone-acetaminophen (pain), diclofenac 75mg  Current triptans:  Maxalt 10mg  Current ergotamine:  none Current anti-emetic:  Zofran ODT 4mg  Current muscle relaxants:  Robaxin Current Antihypertensive medications:  Metoprolol succinate, amlodipine Current Antidepressant medications:  none Current Anticonvulsant medications: gabapentin 300mg  BID, oxcarbazepine 900mg  at bedtime (for chronic pain), zonisamide 200mg  daily Current anti-CGRP:  none Current Vitamins/Herbal/Supplements:  D, melatonin Current Antihistamines/Decongestants:  Meclizine 25mg  PRN, Benadryl 50mg  QHS, Flonase Other therapy:  Botox Hormone/birth control:   none Other medications:  Ambien, Seroquel   Caffeine:  No coffee.  Occasional Coke Diet:  1 gallon water daily.  Tries not to skip meals.   Exercise:  Unable due to pain, nausea and dizziness Depression/Anxiety:  yes Other pain:  fibromyalgia - pain is severe everyday.   Sleep hygiene: Has mild OSA but with severe sleep hypoxia.  Started CPAP and now adequately treated.     HISTORY:  She has had migraines for many years.  They were manageable, usually occurring once a month.  They started to become more frequent in 2020, progressing to the point that she now has a persistent daily headache.  They are typically bifrontal and pounding, associated with nausea, vomiting, vertigo, photophobia, phonophobia and blurred vision but no numbness or weakness.  Intensity will fluctuate from dull-moderate to severe about once a week for 2-3 days.  Sunlight, loud noise and quick movements are aggravating factors.  Resting in a dark and cool quiet room helps relieve pain.  She cannot really identify a specific trigger for her chronic daily headache but it may have followed a fall in which she tripped and hit her head.   She treats headache with sumatriptan (2-3 days a week) and treats nausea with Zofran and dizziness with meclizine.  She also takes hydrocodone for fibromyalgia.    MRI of brain with and without contrast on 09/03/2020 showed enhancing 8 x 6 mm posterior fossa mass left of midline with edema within the superior cerebellum.  Follow up MRI on 09/24/2020 was stable.  She was referred to neurosurgery who favored to closely monitor.  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 Repeat MRI of brain with and without contrast on 09/03/2021 personally  reviewed showed increase size of the left superior cerebellar vermis mass from 1.1 x 1.0 cm to 1.5 x 1.5 cm with mild increased surrounding edema.  Plan was to undergo surgery with Dr. Yetta Barre.  However, she  presented to [facility] in [place] with tremors and increased headaches.  She underwent craniotomy and tumor resection there with Dr. Eber Hong of neurosurgery.  Postoperative MRI showed small area of diffusion restriction in the cerebellum.  No complications.  Biopsy revealed grade 1  hemangioblastoma.  She was discharged to inpatient rehab where it was noted to have bilateral upper and lower extremity thrashing without loss of consciousness at night while in bed.  Evalauted by neurology who did not suspect seizure but rather secondary to her untreated sleep apnea. Started CPAP.  She finds Botox to be helpful.  Headaches are no longer diffuse, just left sided although it can be painful.  Occurring 15 days a month.  She reports that she had an episode where she "blacked out".  She was sitting watching TV while talking to a friend on the phone and the next thing she knew, something else was playing on the TV suggesting time elapsed.  She was still on the phone with her friend. Started having worsening headache in June 2023.  Seen in ED on 02/07/2022.  CTA head personally reviewed was unremarkable.  MRI of brain with and without contrast personally reviewed limited by motion artifact but showed no evidence of residual or recurrent mass in the resection within the left cerebellar hemisphere.     Past NSAIDS/analgesics:  Ibuprofen, naproxen, Excedrin/BC/Goody Past abortive triptans:  sumatriptan  Past abortive ergotamine:  none Past muscle relaxants:  Flexeril Past anti-emetic:  Promethazine 25mg  Past antihypertensive medications:  none Past antidepressant medications:  Amitriptyline or nortriptyline, venlafaxine Past anticonvulsant medications:  topiramate, gabapentin Past anti-CGRP:  Emgality Past vitamins/Herbal/Supplements:  none Past antihistamines/decongestants:  none Other past therapies:  vestibular rehab     Family history of headache:  unknown  PAST MEDICAL HISTORY: Past Medical  History:  Diagnosis Date   Allergies    Anemia    Arthritis    Asthma    Back pain    Chronic pain    Diabetes (HCC)    Diabetes mellitus without complication (HCC)    Edema, lower extremity    Fibroid    Fibromyalgia    Chronic   GERD (gastroesophageal reflux disease)    Headache    migraines   Hepatic steatosis    High blood pressure    History of hiatal hernia    History of kidney stones    History of stomach ulcers    IBS (irritable bowel syndrome)    Joint pain    Sleep apnea    did use cpap-lost 25lb-says she does not need it   NO CPAP   Wears contact lenses     MEDICATIONS: Current Outpatient Medications on File Prior to Visit  Medication Sig Dispense Refill   ABRYSVO 120 MCG/0.5ML injection      acetaminophen (TYLENOL) 325 MG tablet Take 1-2 tablets (325-650 mg total) by mouth every 4 (four) hours as needed for mild pain.     albuterol (PROVENTIL) (2.5 MG/3ML) 0.083% nebulizer solution Take 2.5 mg by nebulization every 6 (six) hours as needed for wheezing or shortness of breath.     albuterol (VENTOLIN HFA) 108 (90 Base) MCG/ACT inhaler Inhale 2 puffs into the lungs every 4 (four) hours as needed.     amLODipine (NORVASC)  10 MG tablet Take 10 mg by mouth daily.     atorvastatin (LIPITOR) 10 MG tablet Take 1 tablet (10 mg total) by mouth at bedtime. 30 tablet 0   azelastine (ASTELIN) 0.1 % nasal spray Place 1 spray into both nostrils 2 (two) times daily as needed for rhinitis. 30 mL 0   BISACODYL 5 MG EC tablet Take 10 mg by mouth 2 (two) times daily.     botulinum toxin Type A (BOTOX) 200 units injection Inject 155 units IM into multiple site in the face,neck and head once every 90 days 1 each 4   BREO ELLIPTA 200-25 MCG/ACT AEPB Inhale 1 puff into the lungs daily.     COMIRNATY syringe      cyclobenzaprine (FLEXERIL) 10 MG tablet Take 20 mg by mouth at bedtime.     diclofenac (VOLTAREN) 75 MG EC tablet Take 75 mg by mouth 2 (two) times daily.     EPINEPHrine 0.3  mg/0.3 mL IJ SOAJ injection Inject 0.3 mg into the muscle as needed for anaphylaxis.     fluticasone (FLONASE) 50 MCG/ACT nasal spray Place 1 spray into both nostrils in the morning and at bedtime.     furosemide (LASIX) 20 MG tablet Take 20 mg by mouth daily as needed for fluid.     gabapentin (NEURONTIN) 300 MG capsule Take 300 mg by mouth 2 (two) times daily.     HYDROcodone-acetaminophen (NORCO/VICODIN) 5-325 MG tablet Take 3 tablets by mouth at bedtime.     levocetirizine (XYZAL) 5 MG tablet Take 1 tablet (5 mg total) by mouth every evening. 30 tablet 0   lidocaine-prilocaine (EMLA) cream Apply 1 Application topically as needed. 30 g 0   LINZESS 145 MCG CAPS capsule Take 145 mcg by mouth every morning.     meclizine (ANTIVERT) 25 MG tablet Take 1 tablet (25 mg total) by mouth 3 (three) times daily as needed for dizziness. 60 tablet 0   melatonin 5 MG TABS Take 1 tablet (5 mg total) by mouth at bedtime. 30 tablet 0   methocarbamol (ROBAXIN) 500 MG tablet Take 750 mg by mouth every 8 (eight) hours as needed for muscle spasms.     montelukast (SINGULAIR) 10 MG tablet Take 1 tablet (10 mg total) by mouth at bedtime. 30 tablet 0   NON FORMULARY Pt uses a cpap nightly     ondansetron (ZOFRAN-ODT) 4 MG disintegrating tablet TAKE ONE TABLET BY MOUTH every EIGHT hours AS NEEDED FOR NAUSEA/VOMITING 90 tablet 2   ondansetron (ZOFRAN-ODT) 8 MG disintegrating tablet Take 8 mg by mouth every 8 (eight) hours as needed.     Oxcarbazepine (TRILEPTAL) 300 MG tablet Take 3 tablets (900 mg total) by mouth at bedtime. For nerve pain 270 tablet 1   oxybutynin (DITROPAN) 5 MG tablet Take 1 tablet (5 mg total) by mouth every 8 (eight) hours as needed for bladder spasms. (Patient taking differently: Take 5 mg by mouth daily.) 30 tablet 0   pantoprazole (PROTONIX) 40 MG tablet Take 1 tablet (40 mg total) by mouth daily. (Patient taking differently: Take 40 mg by mouth in the morning and at bedtime.) 30 tablet 0    polyethylene glycol (MIRALAX / GLYCOLAX) 17 g packet Take 17 g by mouth daily as needed for mild constipation. 14 each 0   QUEtiapine (SEROQUEL) 300 MG tablet Take 1 tablet (300 mg total) by mouth at bedtime. 90 tablet 3   rizatriptan (MAXALT) 10 MG tablet Take 10 mg by mouth  daily as needed for migraine.     Semaglutide,0.25 or 0.5MG /DOS, (OZEMPIC, 0.25 OR 0.5 MG/DOSE,) 2 MG/1.5ML SOPN Inject 0.5 mg into the skin once a week. (Patient taking differently: Inject 0.5 mg into the skin every Sunday.) 1.5 mL 0   senna-docusate (SENOKOT-S) 8.6-50 MG tablet Take 2 tablets by mouth at bedtime. 60 tablet 0   tamsulosin (FLOMAX) 0.4 MG CAPS capsule Take 1 capsule (0.4 mg total) by mouth at bedtime. 30 capsule 0   Tetrahydrozoline HCl (VISINE OP) Place 1 drop into both eyes 2 (two) times daily as needed (dry eyes).     Vitamin D, Ergocalciferol, (DRISDOL) 1.25 MG (50000 UNIT) CAPS capsule TAKE ONE CAPSULE BY MOUTH EVERY 7 DAYS (Patient taking differently: Take 50,000 Units by mouth every Sunday.) 4 capsule 0   zolpidem (AMBIEN) 10 MG tablet Take 1 tablet (10 mg total) by mouth at bedtime. 30 tablet 5   zonisamide (ZONEGRAN) 100 MG capsule Take 2 capsules (200 mg total) by mouth daily. 30 capsule 5   No current facility-administered medications on file prior to visit.    ALLERGIES: Allergies  Allergen Reactions   Rocephin [Ceftriaxone] Anaphylaxis   Morphine And Codeine Itching and Nausea And Vomiting    Doesn't work   Prednisone Itching and Swelling   Sulfa Antibiotics Itching and Swelling   Amitriptyline Other (See Comments)   Penicillin G Sodium Other (See Comments)    FAMILY HISTORY: Family History  Problem Relation Age of Onset   Obesity Mother    Diabetes Father    High blood pressure Father    Sudden death Father    Breast cancer Paternal Grandmother    Breast cancer Paternal Aunt       Objective:  *** General: No acute distress.  Patient appears well-groomed.   Head:   Normocephalic/atraumatic Neck:  Supple.  No paraspinal tenderness.  Full range of motion. Heart:  Regular rate and rhythm. Neuro:  Alert and oriented.  Speech fluent and not dysarthric.  Language intact.  CN II-XII intact.  Bulk and tone normal.  Muscle strength 5/5 throughout.  Deep tendon reflexes 2+ throughout.  Gait normal.  Romberg negative.     Shon Millet, DO  CC: Johny Blamer, MD

## 2023-02-14 ENCOUNTER — Ambulatory Visit: Payer: Medicare HMO | Admitting: Neurology

## 2023-02-14 ENCOUNTER — Encounter: Payer: Self-pay | Admitting: Neurology

## 2023-02-14 VITALS — BP 132/80 | HR 98 | Ht 63.0 in | Wt 241.0 lb

## 2023-02-14 DIAGNOSIS — G43709 Chronic migraine without aura, not intractable, without status migrainosus: Secondary | ICD-10-CM | POA: Diagnosis not present

## 2023-02-14 DIAGNOSIS — D496 Neoplasm of unspecified behavior of brain: Secondary | ICD-10-CM | POA: Diagnosis not present

## 2023-02-14 MED ORDER — ZONISAMIDE 100 MG PO CAPS: 300.0000 mg | ORAL_CAPSULE | Freq: Every day | ORAL | 5 refills | Status: DC

## 2023-02-14 MED ORDER — ONDANSETRON HCL 4 MG PO TABS: 4.0000 mg | ORAL_TABLET | Freq: Three times a day (TID) | ORAL | 5 refills | Status: DC | PRN

## 2023-02-14 NOTE — Progress Notes (Signed)
Medication Samples have been provided to the patient.  Drug name: Bernita Raisin       Strength: 100 mg        Qty: 4  LOT: [id number]  Exp.Date: 07/2024  Dosing instructions: as needed  The patient has been instructed regarding the correct time, dose, and frequency of taking this medication, including desired effects and most common side effects.   Leida Lauth 8:47 AM 02/14/2023

## 2023-02-14 NOTE — Patient Instructions (Addendum)
Increase zonisamide to 300mg  daily Continue Botox Stop rizatriptan.  At earliest onset of headache, take Ubrelvy.  May repeat in 1 hour (2 tablets in 24 hours).  Let me know if effective. Zofran for nausea MRI of brain in July Follow up 6 months.

## 2023-02-20 ENCOUNTER — Telehealth: Payer: Self-pay | Admitting: Neurology

## 2023-02-20 DIAGNOSIS — J3081 Allergic rhinitis due to animal (cat) (dog) hair and dander: Secondary | ICD-10-CM | POA: Diagnosis not present

## 2023-02-20 DIAGNOSIS — J301 Allergic rhinitis due to pollen: Secondary | ICD-10-CM | POA: Diagnosis not present

## 2023-02-20 DIAGNOSIS — J3089 Other allergic rhinitis: Secondary | ICD-10-CM | POA: Diagnosis not present

## 2023-02-20 MED ORDER — UBRELVY 100 MG PO TABS: 100.0000 mg | ORAL_TABLET | ORAL | 5 refills | Status: DC | PRN

## 2023-02-20 NOTE — Telephone Encounter (Signed)
Pt states that she was given samples of the ubrelvy and they are working for her. She wants to know how often she is to take the medication.    [facility]

## 2023-02-25 ENCOUNTER — Telehealth: Payer: Self-pay | Admitting: Pharmacy Technician

## 2023-02-25 NOTE — Telephone Encounter (Signed)
PA request has been Submitted. New Encounter created for follow up. For additional info see Pharmacy Prior Auth telephone encounter from 02/25/2023.

## 2023-02-25 NOTE — Telephone Encounter (Signed)
Pharmacy Patient Advocate Encounter   Received notification from Pt Calls Messages that prior authorization for Ubrelvy 100MG  tablets is required/requested.    PA submitted to [facility] via CoverMyMeds Key/confirmation #/EOC [id number] Status is pending

## 2023-02-26 ENCOUNTER — Telehealth: Payer: Self-pay | Admitting: Neurology

## 2023-02-26 DIAGNOSIS — J3089 Other allergic rhinitis: Secondary | ICD-10-CM | POA: Diagnosis not present

## 2023-02-26 DIAGNOSIS — J3081 Allergic rhinitis due to animal (cat) (dog) hair and dander: Secondary | ICD-10-CM | POA: Diagnosis not present

## 2023-02-26 DIAGNOSIS — J301 Allergic rhinitis due to pollen: Secondary | ICD-10-CM | POA: Diagnosis not present

## 2023-02-26 NOTE — Telephone Encounter (Signed)
Pt left message with the after hour service on 02-25-23 at 5:34 pm   Caller states that she was given samples of the uberly and it was called into the pharmacy. She was told that it would be 400.00 and this is to much

## 2023-02-26 NOTE — Telephone Encounter (Signed)
Per Dr.Jaffe,  I would go ahead with the PA and try for tier exception.  Triptans such as sumatriptan and rizatriptan, are ineffective and Bernita Raisin has been effective.     Patient advised.

## 2023-02-26 NOTE — Telephone Encounter (Signed)
Pharmacy Patient Advocate Encounter  Received notification from [facility] that Prior Authorization for Ubrelvy 100MG  tablets  has been APPROVED from 02/25/2023 to 08/20/2023.Marland Kitchen  PA #/Case ID/Reference #: [id number]

## 2023-02-27 ENCOUNTER — Other Ambulatory Visit: Payer: Self-pay | Admitting: Neurology

## 2023-02-27 ENCOUNTER — Other Ambulatory Visit (HOSPITAL_COMMUNITY): Payer: Self-pay

## 2023-02-27 MED ORDER — ELETRIPTAN HYDROBROMIDE 40 MG PO TABS: 40.0000 mg | ORAL_TABLET | ORAL | 5 refills | Status: DC | PRN

## 2023-02-27 NOTE — Telephone Encounter (Signed)
Patient advised, of Prior auth team message.  Per Patient this may be until January what should she do to then. Please advise.

## 2023-02-27 NOTE — Telephone Encounter (Signed)
Prior Authorization has already been approve the patient in their Coverage Gap (donut hole) through their Medicare Part D.  A tier exception will not reduce the copay/coinsurance when the member is in the Coverage Gap.

## 2023-02-28 ENCOUNTER — Other Ambulatory Visit: Payer: Self-pay | Admitting: Psychiatry

## 2023-02-28 ENCOUNTER — Ambulatory Visit: Payer: Medicare HMO | Admitting: Radiology

## 2023-02-28 DIAGNOSIS — F5105 Insomnia due to other mental disorder: Secondary | ICD-10-CM

## 2023-02-28 NOTE — Telephone Encounter (Signed)
Patient advised of Dr. Everlena Cooper note, I sent a prescription for eletriptan to [facility].  Hopefully this will be approved right away.

## 2023-03-01 ENCOUNTER — Encounter: Payer: Medicare HMO | Admitting: Physical Medicine and Rehabilitation

## 2023-03-01 ENCOUNTER — Other Ambulatory Visit: Payer: Self-pay

## 2023-03-01 DIAGNOSIS — F5105 Insomnia due to other mental disorder: Secondary | ICD-10-CM

## 2023-03-04 MED ORDER — ZOLPIDEM TARTRATE 10 MG PO TABS: 10.0000 mg | ORAL_TABLET | Freq: Every day | ORAL | 5 refills | Status: DC

## 2023-03-05 ENCOUNTER — Other Ambulatory Visit: Payer: Self-pay

## 2023-03-07 ENCOUNTER — Telehealth: Payer: Self-pay | Admitting: Neurology

## 2023-03-07 DIAGNOSIS — J3089 Other allergic rhinitis: Secondary | ICD-10-CM | POA: Diagnosis not present

## 2023-03-07 DIAGNOSIS — J301 Allergic rhinitis due to pollen: Secondary | ICD-10-CM | POA: Diagnosis not present

## 2023-03-07 DIAGNOSIS — J3081 Allergic rhinitis due to animal (cat) (dog) hair and dander: Secondary | ICD-10-CM | POA: Diagnosis not present

## 2023-03-07 NOTE — Telephone Encounter (Signed)
Patient is calling to check on Prior Auth. For Eletriptan 40mg  .Patient is starting to get headaches.

## 2023-03-10 ENCOUNTER — Telehealth: Payer: Self-pay | Admitting: Pharmacy Technician

## 2023-03-10 ENCOUNTER — Other Ambulatory Visit (HOSPITAL_COMMUNITY): Payer: Self-pay

## 2023-03-10 NOTE — Telephone Encounter (Signed)
Pharmacy Patient Advocate Encounter   Received notification from CoverMyMeds that prior authorization for ELETRIPTAN 40MG  is required/requested.   Insurance verification completed.   The patient is insured through [employer] .   Per test claim: PA submitted to [facility] via CoverMyMeds Key/confirmation #/EOC [id number] Status is pending

## 2023-03-11 NOTE — Telephone Encounter (Signed)
Pharmacy Patient Advocate Encounter  Received notification from [facility] that Prior Authorization for Eletriptan Hydrobromide 40MG  tablets has been DENIED because the drug you asked for is not listed in your preferred drug list (formulary). The preferred drug(s) you may not have tried, are: naratriptan tablet. Your provider needs to give [name] medical reasons why the preferred drug(s) would not work for you and/or would have bad side effects...  PA #/Case ID/Reference #: [id number]  Please be advised we currently do not have a Pharmacist to review denials, therefore you will need to process appeals accordingly as needed. Thanks for your support at this time. Contact for appeals are as follows: Phone: [phone], Fax: [phone]   Last day to appeal is within 60 days of denial - 03-11-2023

## 2023-03-11 NOTE — Telephone Encounter (Signed)
Pt is calling in checking the status of the PA and stated that she was just calling in due to the pharmacy stated that she need to call and speak with [name] about it.  Pt is aware that we did receive the message from her on 03/07/2023 and the PA team is working as hard as they can due to the [name] not having internet on Friday it was a little delay but they are working on it.  Pt verbalize understanding and will await to hear from someone once it has be approved.

## 2023-03-12 MED ORDER — NARATRIPTAN HCL 2.5 MG PO TABS: 2.5000 mg | ORAL_TABLET | ORAL | 5 refills | Status: DC | PRN

## 2023-03-12 NOTE — Progress Notes (Signed)
Sent in prescription for naratriptan 2.5mg  to [facility]

## 2023-03-12 NOTE — Telephone Encounter (Signed)
PA has been denied. Must try naratriptan.Please see other encounter for further instructions

## 2023-03-13 ENCOUNTER — Other Ambulatory Visit: Payer: Self-pay | Admitting: Neurology

## 2023-03-13 DIAGNOSIS — J301 Allergic rhinitis due to pollen: Secondary | ICD-10-CM | POA: Diagnosis not present

## 2023-03-13 DIAGNOSIS — J3081 Allergic rhinitis due to animal (cat) (dog) hair and dander: Secondary | ICD-10-CM | POA: Diagnosis not present

## 2023-03-13 DIAGNOSIS — J3089 Other allergic rhinitis: Secondary | ICD-10-CM | POA: Diagnosis not present

## 2023-03-13 NOTE — Telephone Encounter (Signed)
Per Dr.Jaffe, So we avoid further delay, can you have the patient contact her insurance to find out which triptans are formulary?  She has already tried sumatriptan and rizatriptan    Naratriptan sent into the local pharmacy.  Patient advised.

## 2023-03-14 ENCOUNTER — Ambulatory Visit (INDEPENDENT_AMBULATORY_CARE_PROVIDER_SITE_OTHER): Payer: Medicare HMO | Admitting: Radiology

## 2023-03-14 ENCOUNTER — Encounter: Payer: Self-pay | Admitting: Radiology

## 2023-03-14 VITALS — BP 118/80 | Ht 60.0 in | Wt 243.0 lb

## 2023-03-14 DIAGNOSIS — Z9189 Other specified personal risk factors, not elsewhere classified: Secondary | ICD-10-CM | POA: Diagnosis not present

## 2023-03-14 DIAGNOSIS — N951 Menopausal and female climacteric states: Secondary | ICD-10-CM

## 2023-03-14 DIAGNOSIS — Z01419 Encounter for gynecological examination (general) (routine) without abnormal findings: Secondary | ICD-10-CM

## 2023-03-14 DIAGNOSIS — Z8619 Personal history of other infectious and parasitic diseases: Secondary | ICD-10-CM

## 2023-03-14 NOTE — Progress Notes (Signed)
Virginia Luna [birth year] [id number]   History: Postmenopausal 62 y.o. presents for annual exam. No gyn concerns. High risk, hx of STD. Multiple medical problems, followed closely by PCP.   Gynecologic History Postmenopausal Last Pap: 2022. Results were: normal Last mammogram: 2024. Results were: normal Last colonoscopy: 2024   Obstetric History OB History  Gravida Para Term Preterm AB Living  0 0 0 0 0 0  SAB IAB Ectopic Multiple Live Births  0 0 0 0 0   Past Medical History:  Diagnosis Date   Allergies    Anemia    Arthritis    Asthma    Back pain    Chronic pain    Diabetes (HCC)    Diabetes mellitus without complication (HCC)    Edema, lower extremity    Fibroid    Fibromyalgia    Chronic   GERD (gastroesophageal reflux disease)    Headache    migraines   Hepatic steatosis    High blood pressure    History of hiatal hernia    History of kidney stones    History of stomach ulcers    IBS (irritable bowel syndrome)    Joint pain    Sleep apnea    did use cpap-lost 25lb-says she does not need it   NO CPAP   Wears contact lenses      Current Outpatient Medications on File Prior to Visit  Medication Sig Dispense Refill   ABRYSVO 120 MCG/0.5ML injection      acetaminophen (TYLENOL) 325 MG tablet Take 1-2 tablets (325-650 mg total) by mouth every 4 (four) hours as needed for mild pain.     albuterol (PROVENTIL) (2.5 MG/3ML) 0.083% nebulizer solution Take 2.5 mg by nebulization every 6 (six) hours as needed for wheezing or shortness of breath.     albuterol (VENTOLIN HFA) 108 (90 Base) MCG/ACT inhaler Inhale 2 puffs into the lungs every 4 (four) hours as needed.     amLODipine (NORVASC) 10 MG tablet Take 10 mg by mouth daily.     atorvastatin (LIPITOR) 10 MG tablet Take 1 tablet (10 mg total) by mouth at bedtime. 30 tablet 0   azelastine (ASTELIN) 0.1 % nasal spray Place 1 spray into both nostrils 2 (two) times daily as needed for rhinitis. 30 mL 0   BISACODYL  5 MG EC tablet Take 10 mg by mouth 2 (two) times daily.     botulinum toxin Type A (BOTOX) 200 units injection Inject 155 units IM into multiple site in the face,neck and head once every 90 days 1 each 4   desloratadine (CLARINEX) 5 MG tablet Take 5 mg by mouth daily.     EPINEPHrine 0.3 mg/0.3 mL IJ SOAJ injection Inject 0.3 mg into the muscle as needed for anaphylaxis.     fluticasone (FLONASE) 50 MCG/ACT nasal spray Place 1 spray into both nostrils in the morning and at bedtime.     furosemide (LASIX) 20 MG tablet Take 20 mg by mouth daily as needed for fluid.     gabapentin (NEURONTIN) 300 MG capsule Take 300 mg by mouth 2 (two) times daily.     HYDROcodone-acetaminophen (NORCO/VICODIN) 5-325 MG tablet Take 3 tablets by mouth at bedtime.     levocetirizine (XYZAL) 5 MG tablet Take 1 tablet (5 mg total) by mouth every evening. 30 tablet 0   lidocaine-prilocaine (EMLA) cream Apply 1 Application topically as needed. 30 g 0   LINZESS 145 MCG CAPS capsule Take 145 mcg by  mouth every morning.     meclizine (ANTIVERT) 25 MG tablet Take 1 tablet (25 mg total) by mouth 3 (three) times daily as needed for dizziness. 60 tablet 0   melatonin 5 MG TABS Take 1 tablet (5 mg total) by mouth at bedtime. 30 tablet 0   methocarbamol (ROBAXIN) 500 MG tablet Take 750 mg by mouth every 8 (eight) hours as needed for muscle spasms.     montelukast (SINGULAIR) 10 MG tablet Take 1 tablet (10 mg total) by mouth at bedtime. 30 tablet 0   naratriptan (AMERGE) 2.5 MG tablet Take 1 tablet (2.5 mg total) by mouth as needed for migraine. Take one (1) tablet at onset of headache; if returns or does not resolve, may repeat after 4 hours; do not exceed five (5) mg in 24 hours. 10 tablet 5   NON FORMULARY Pt uses a cpap nightly     ondansetron (ZOFRAN) 4 MG tablet Take 1 tablet (4 mg total) by mouth every 8 (eight) hours as needed for nausea or vomiting. 20 tablet 5   ondansetron (ZOFRAN-ODT) 4 MG disintegrating tablet TAKE ONE  TABLET BY MOUTH every EIGHT hours AS NEEDED FOR NAUSEA/VOMITING 90 tablet 2   ondansetron (ZOFRAN-ODT) 8 MG disintegrating tablet Take 8 mg by mouth every 8 (eight) hours as needed.     Oxcarbazepine (TRILEPTAL) 300 MG tablet Take 3 tablets (900 mg total) by mouth at bedtime. For nerve pain 270 tablet 1   oxybutynin (DITROPAN) 5 MG tablet Take 1 tablet (5 mg total) by mouth every 8 (eight) hours as needed for bladder spasms. (Patient taking differently: Take 5 mg by mouth daily.) 30 tablet 0   pantoprazole (PROTONIX) 40 MG tablet Take 1 tablet (40 mg total) by mouth daily. (Patient taking differently: Take 40 mg by mouth in the morning and at bedtime.) 30 tablet 0   polyethylene glycol (MIRALAX / GLYCOLAX) 17 g packet Take 17 g by mouth daily as needed for mild constipation. 14 each 0   QUEtiapine (SEROQUEL) 300 MG tablet Take 1 tablet (300 mg total) by mouth at bedtime. 90 tablet 3   Semaglutide,0.25 or 0.5MG /DOS, (OZEMPIC, 0.25 OR 0.5 MG/DOSE,) 2 MG/1.5ML SOPN Inject 0.5 mg into the skin once a week. (Patient taking differently: Inject 0.5 mg into the skin every Sunday.) 1.5 mL 0   senna-docusate (SENOKOT-S) 8.6-50 MG tablet Take 2 tablets by mouth at bedtime. 60 tablet 0   tamsulosin (FLOMAX) 0.4 MG CAPS capsule Take 1 capsule (0.4 mg total) by mouth at bedtime. 30 capsule 0   Tetrahydrozoline HCl (VISINE OP) Place 1 drop into both eyes 2 (two) times daily as needed (dry eyes).     Vitamin D, Ergocalciferol, (DRISDOL) 1.25 MG (50000 UNIT) CAPS capsule TAKE ONE CAPSULE BY MOUTH EVERY 7 DAYS (Patient taking differently: Take 50,000 Units by mouth every Sunday.) 4 capsule 0   zolpidem (AMBIEN) 10 MG tablet Take 1 tablet (10 mg total) by mouth at bedtime. 30 tablet 5   zonisamide (ZONEGRAN) 100 MG capsule Take 3 capsules (300 mg total) by mouth daily. 90 capsule 5   BREO ELLIPTA 200-25 MCG/ACT AEPB Inhale 1 puff into the lungs daily. (Patient not taking: Reported on 03/14/2023)     COMIRNATY syringe   (Patient not taking: Reported on 03/14/2023)     cyclobenzaprine (FLEXERIL) 10 MG tablet Take 20 mg by mouth at bedtime. (Patient not taking: Reported on 03/14/2023)     diclofenac (VOLTAREN) 75 MG EC tablet Take 75 mg  by mouth 2 (two) times daily. (Patient not taking: Reported on 03/14/2023)     Ubrogepant (UBRELVY) 100 MG TABS Take 1 tablet (100 mg total) by mouth as needed (take 1 tab at the earlist onset of a migraine. May repeat in 2 hours. Max 2 tabs in 24 hours). (Patient not taking: Reported on 03/14/2023) 16 tablet 5   No current facility-administered medications on file prior to visit.    The following portions of the patient's history were reviewed and updated as appropriate: allergies, current medications, past family history, past medical history, past social history, past surgical history, and problem list.  Review of Systems Pertinent items noted in HPI and remainder of comprehensive ROS otherwise negative.  Past medical history, past surgical history, family history and social history were all reviewed and documented in the EPIC chart.  Exam:  Vitals:   03/14/23 0738  BP: 118/80  Weight: 243 lb (110.2 kg)  Height: 5' (1.524 m)   Body mass index is 47.46 kg/m.  General appearance:  Normal Thyroid:  Symmetrical, normal in size, without palpable masses or nodularity. Respiratory  Auscultation:  Clear without wheezing or rhonchi Cardiovascular  Auscultation:  Regular rate, without rubs, murmurs or gallops  Edema/varicosities:  Not grossly evident Abdominal  Soft,nontender, without masses, guarding or rebound.  Liver/spleen:  No organomegaly noted  Hernia:  None appreciated  Skin  Inspection:  Grossly normal Breasts: Examined lying and sitting.   Right: Without masses, retractions, nipple discharge or axillary adenopathy.   Left: Without masses, retractions, nipple discharge or axillary adenopathy. Genitourinary   Inguinal/mons:  Normal without inguinal  adenopathy  External genitalia:  Normal appearing vulva with no masses, tenderness, or lesions  BUS/Urethra/Skene's glands:  Normal  Vagina:  Normal appearing with normal color and discharge, no lesions. Atrophy: mild   Cervix:  Normal appearing without discharge or lesions  Uterus:  Normal in shape and contour. 10week size, known fibroids. Limited assessment due to large habitus.    Adnexa/parametria:     Rt: Normal in size, without masses or tenderness.   Lt: Normal in size, without masses or tenderness.  Anus and perineum: Normal    Raynelle Fanning, CMA present for exam  Assessment/Plan:   1. Well woman exam with routine gynecological exam Pap up to date High risk medicare, f/u 1 year for B&P   Discussed SBE, colonoscopy and DEXA screening as directed. Recommend of exercise weekly, including weight bearing exercise. Encouraged the use of seatbelts and sunscreen.    Arlie Solomons B WHNP-BC, 8:21 AM 03/14/2023

## 2023-03-16 ENCOUNTER — Other Ambulatory Visit: Payer: Self-pay | Admitting: Neurology

## 2023-03-16 ENCOUNTER — Ambulatory Visit
Admission: RE | Admit: 2023-03-16 | Discharge: 2023-03-16 | Disposition: A | Discharge status: Home / Self Care | Payer: Medicare HMO | Source: Ambulatory Visit | Attending: Neurology | Admitting: Neurology

## 2023-03-16 DIAGNOSIS — R519 Headache, unspecified: Secondary | ICD-10-CM | POA: Diagnosis not present

## 2023-03-16 DIAGNOSIS — G43709 Chronic migraine without aura, not intractable, without status migrainosus: Secondary | ICD-10-CM

## 2023-03-16 DIAGNOSIS — D496 Neoplasm of unspecified behavior of brain: Secondary | ICD-10-CM

## 2023-03-16 MED ORDER — GADOPICLENOL 0.5 MMOL/ML IV SOLN: 10.0000 mL | Freq: Once | INTRAVENOUS | Status: DC | PRN

## 2023-03-19 ENCOUNTER — Telehealth: Payer: Self-pay | Admitting: Neurology

## 2023-03-19 DIAGNOSIS — J3089 Other allergic rhinitis: Secondary | ICD-10-CM | POA: Diagnosis not present

## 2023-03-19 DIAGNOSIS — D496 Neoplasm of unspecified behavior of brain: Secondary | ICD-10-CM

## 2023-03-19 DIAGNOSIS — J301 Allergic rhinitis due to pollen: Secondary | ICD-10-CM | POA: Diagnosis not present

## 2023-03-19 DIAGNOSIS — G43709 Chronic migraine without aura, not intractable, without status migrainosus: Secondary | ICD-10-CM

## 2023-03-19 DIAGNOSIS — J3081 Allergic rhinitis due to animal (cat) (dog) hair and dander: Secondary | ICD-10-CM | POA: Diagnosis not present

## 2023-03-19 NOTE — Telephone Encounter (Signed)
Pt is calling back and stated that she called [facility] and they stated that Dr. Everlena Cooper needs to place a new order for her to have the MRI done.

## 2023-03-19 NOTE — Telephone Encounter (Signed)
Called Pt and [facility] did not reschedule her when a nurse would be there so I had to call and see what happened and they just reported that they only had someone during the week to help with people hard to stick. Called Pt and she is going to call [facility] and reschedule. I told her to call us if she needs more help.

## 2023-03-19 NOTE — Telephone Encounter (Signed)
Pt is calling in stating that she was not able to do the MRI due to them not being able to get it started she was stuck five times and since it was on the weekend they did not have a nurse on staff to get it done.  Pt was not sure what she needed to do from here.

## 2023-03-19 NOTE — Telephone Encounter (Addendum)
New order faxed to [facility] for pt MRI. Pt called an informed

## 2023-03-25 ENCOUNTER — Ambulatory Visit: Payer: Medicare HMO | Admitting: Physical Medicine and Rehabilitation

## 2023-03-25 DIAGNOSIS — G894 Chronic pain syndrome: Secondary | ICD-10-CM | POA: Diagnosis not present

## 2023-03-25 DIAGNOSIS — Z86018 Personal history of other benign neoplasm: Secondary | ICD-10-CM | POA: Diagnosis not present

## 2023-03-25 DIAGNOSIS — G43909 Migraine, unspecified, not intractable, without status migrainosus: Secondary | ICD-10-CM | POA: Diagnosis not present

## 2023-03-25 DIAGNOSIS — Z6841 Body Mass Index (BMI) 40.0 and over, adult: Secondary | ICD-10-CM | POA: Diagnosis not present

## 2023-03-25 DIAGNOSIS — E1169 Type 2 diabetes mellitus with other specified complication: Secondary | ICD-10-CM | POA: Diagnosis not present

## 2023-03-26 DIAGNOSIS — J3081 Allergic rhinitis due to animal (cat) (dog) hair and dander: Secondary | ICD-10-CM | POA: Diagnosis not present

## 2023-03-26 DIAGNOSIS — J301 Allergic rhinitis due to pollen: Secondary | ICD-10-CM | POA: Diagnosis not present

## 2023-03-26 DIAGNOSIS — J3089 Other allergic rhinitis: Secondary | ICD-10-CM | POA: Diagnosis not present

## 2023-03-31 ENCOUNTER — Other Ambulatory Visit: Payer: Self-pay | Admitting: Physical Medicine and Rehabilitation

## 2023-04-02 DIAGNOSIS — J3089 Other allergic rhinitis: Secondary | ICD-10-CM | POA: Diagnosis not present

## 2023-04-02 DIAGNOSIS — J3081 Allergic rhinitis due to animal (cat) (dog) hair and dander: Secondary | ICD-10-CM | POA: Diagnosis not present

## 2023-04-02 DIAGNOSIS — J301 Allergic rhinitis due to pollen: Secondary | ICD-10-CM | POA: Diagnosis not present

## 2023-04-09 ENCOUNTER — Telehealth: Payer: Self-pay | Admitting: Neurology

## 2023-04-09 DIAGNOSIS — J301 Allergic rhinitis due to pollen: Secondary | ICD-10-CM | POA: Diagnosis not present

## 2023-04-09 DIAGNOSIS — J3089 Other allergic rhinitis: Secondary | ICD-10-CM | POA: Diagnosis not present

## 2023-04-09 DIAGNOSIS — J3081 Allergic rhinitis due to animal (cat) (dog) hair and dander: Secondary | ICD-10-CM | POA: Diagnosis not present

## 2023-04-09 NOTE — Telephone Encounter (Signed)
Called Pt back and she was calling to inform us that [facility] is closing down. I told her thank you and that if she needs [name] let us know.

## 2023-04-09 NOTE — Telephone Encounter (Signed)
Patient is requesting a call from nurse.

## 2023-04-11 ENCOUNTER — Encounter: Payer: Self-pay | Admitting: Neurology

## 2023-04-12 ENCOUNTER — Encounter: Payer: Self-pay | Admitting: Physical Medicine and Rehabilitation

## 2023-04-12 ENCOUNTER — Encounter: Payer: Medicare HMO | Attending: Physical Medicine and Rehabilitation | Admitting: Physical Medicine and Rehabilitation

## 2023-04-12 VITALS — BP 145/87 | HR 88 | Ht 60.0 in | Wt 240.0 lb

## 2023-04-12 DIAGNOSIS — G894 Chronic pain syndrome: Secondary | ICD-10-CM | POA: Diagnosis not present

## 2023-04-12 DIAGNOSIS — D496 Neoplasm of unspecified behavior of brain: Secondary | ICD-10-CM | POA: Diagnosis not present

## 2023-04-12 DIAGNOSIS — M7918 Myalgia, other site: Secondary | ICD-10-CM

## 2023-04-12 MED ORDER — ONDANSETRON HCL 4 MG PO TABS: 4.0000 mg | ORAL_TABLET | Freq: Three times a day (TID) | ORAL | 5 refills | Status: DC | PRN

## 2023-04-12 MED ORDER — LIDOCAINE HCL 1 % IJ SOLN
6.0000 mL | Freq: Once | INTRAMUSCULAR | Status: DC
Start: 2023-04-12 — End: 2024-04-08

## 2023-04-12 MED ORDER — OXCARBAZEPINE 300 MG PO TABS: 900.0000 mg | ORAL_TABLET | Freq: Every evening | ORAL | 1 refills | Status: DC

## 2023-04-12 NOTE — Patient Instructions (Addendum)
Plan: Con't Trileptal- Oxcarbazepine 900 mg nightly - will move it to [facility].   2.  Con't Zofran - ondansetron-   3. Getting new meds from [facility] pharmacy-   4. Patient here for trigger point injections for myofascial pain  Consent done and on chart.  Cleaned areas with alcohol and injected using a 27 gauge 1.5 inch needle  Injected  6cc- none wasted Using 1% Lidocaine with no EPI  Upper traps- B/L Levators- B/L  Posterior scalenes Middle scalenes- b?L  Splenius Capitus- b/L  Pectoralis Major- B/L Rhomboids- B/L x3 Infraspinatus Teres Major/minor Thoracic paraspinals B/L  Lumbar paraspinals Other injections-    Patient's level of pain prior was 9/10 Current level of pain after injections is- 8/10 coming downwards  There was no bleeding or complications.  Patient was advised to drink a lot of water on day after injections to flush system Will have increased soreness for 12-48 hours after injections.  Can use Lidocaine patches the day AFTER injections Can use theracane on day of injections in places didn't inject Can use heating pad 4-6 hours AFTER injections   5. F/U in 6 weeks not 3 months -f/u on TrP injections and pain

## 2023-04-12 NOTE — Progress Notes (Signed)
Pt is a 62 yr old female with hx of fibromyalgia- dx'd at age 48, DM2,- Ac1 6.2;  asthma; HTN, OSA, BMI is 50; Nodule in L superior cerebellum. Also has migraines- intractable and daily; has trace aura;  No kidney issues- Cr 0.65 and BUN 15 in 6/22. Also end stage OA of B/L knees medially and R hip pain. Also has myofascial pain syndrome.  Has new brain mass that was documented Worsening cerebellar Sx's. 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    [facility] is gone.  Found out when meds were due.   Gets Norco from Dr Tiburcio Pea-     Plan: Con't Trileptal- Oxcarbazepine 900 mg nightly - will move it to [facility].   2.  Con't Zofran - ondansetron-   3. Getting new meds from [facility] pharmacy-   4. Patient here for trigger point injections for myofascial pain  Consent done and on chart.  Cleaned areas with alcohol and injected using a 27 gauge 1.5 inch needle  Injected  6cc- none wasted Using 1% Lidocaine with no EPI  Upper traps- B/L Levators- B/L  Posterior scalenes Middle scalenes- b?L  Splenius Capitus- b/L  Pectoralis Major- B/L Rhomboids- B/L x3 Infraspinatus Teres Major/minor Thoracic paraspinals B/L  Lumbar paraspinals Other injections-    Patient's level of pain prior was 9/10 Current level of pain after injections is- 8/10 coming downwards  There was no bleeding or complications.  Patient was advised to drink a lot of water on day after injections to flush system Will have increased soreness for 12-48 hours after injections.  Can use Lidocaine patches the day AFTER injections Can use theracane on day of injections in places didn't inject Can use heating pad 4-6 hours AFTER injections   5. F/U in 3 months -f/u on TrP injections and pain

## 2023-04-16 ENCOUNTER — Ambulatory Visit
Admission: RE | Admit: 2023-04-16 | Discharge: 2023-04-16 | Disposition: A | Discharge status: Home / Self Care | Payer: Medicare HMO | Source: Ambulatory Visit | Attending: Neurology | Admitting: Neurology

## 2023-04-16 DIAGNOSIS — J301 Allergic rhinitis due to pollen: Secondary | ICD-10-CM | POA: Diagnosis not present

## 2023-04-16 DIAGNOSIS — J3081 Allergic rhinitis due to animal (cat) (dog) hair and dander: Secondary | ICD-10-CM | POA: Diagnosis not present

## 2023-04-16 DIAGNOSIS — D496 Neoplasm of unspecified behavior of brain: Secondary | ICD-10-CM

## 2023-04-16 DIAGNOSIS — R519 Headache, unspecified: Secondary | ICD-10-CM | POA: Diagnosis not present

## 2023-04-16 DIAGNOSIS — G43709 Chronic migraine without aura, not intractable, without status migrainosus: Secondary | ICD-10-CM

## 2023-04-16 DIAGNOSIS — J3089 Other allergic rhinitis: Secondary | ICD-10-CM | POA: Diagnosis not present

## 2023-04-16 MED ORDER — GADOPICLENOL 0.5 MMOL/ML IV SOLN
10.0000 mL | Freq: Once | INTRAVENOUS | Status: AC | PRN
  Administered 2023-04-16: 10 mL via INTRAVENOUS

## 2023-04-23 DIAGNOSIS — J301 Allergic rhinitis due to pollen: Secondary | ICD-10-CM | POA: Diagnosis not present

## 2023-04-23 DIAGNOSIS — J3081 Allergic rhinitis due to animal (cat) (dog) hair and dander: Secondary | ICD-10-CM | POA: Diagnosis not present

## 2023-04-23 DIAGNOSIS — J3089 Other allergic rhinitis: Secondary | ICD-10-CM | POA: Diagnosis not present

## 2023-04-29 DIAGNOSIS — Z79899 Other long term (current) drug therapy: Secondary | ICD-10-CM | POA: Diagnosis not present

## 2023-04-29 DIAGNOSIS — Z6841 Body Mass Index (BMI) 40.0 and over, adult: Secondary | ICD-10-CM | POA: Diagnosis not present

## 2023-04-29 DIAGNOSIS — R110 Nausea: Secondary | ICD-10-CM | POA: Diagnosis not present

## 2023-04-29 DIAGNOSIS — D649 Anemia, unspecified: Secondary | ICD-10-CM | POA: Diagnosis not present

## 2023-04-29 DIAGNOSIS — R296 Repeated falls: Secondary | ICD-10-CM | POA: Diagnosis not present

## 2023-04-29 DIAGNOSIS — Z599 Problem related to housing and economic circumstances, unspecified: Secondary | ICD-10-CM | POA: Diagnosis not present

## 2023-04-29 DIAGNOSIS — Z9889 Other specified postprocedural states: Secondary | ICD-10-CM | POA: Diagnosis not present

## 2023-04-29 DIAGNOSIS — E1169 Type 2 diabetes mellitus with other specified complication: Secondary | ICD-10-CM | POA: Diagnosis not present

## 2023-04-30 ENCOUNTER — Telehealth: Payer: Self-pay | Admitting: Family Medicine

## 2023-04-30 DIAGNOSIS — J3089 Other allergic rhinitis: Secondary | ICD-10-CM | POA: Diagnosis not present

## 2023-04-30 DIAGNOSIS — J301 Allergic rhinitis due to pollen: Secondary | ICD-10-CM | POA: Diagnosis not present

## 2023-04-30 DIAGNOSIS — J3081 Allergic rhinitis due to animal (cat) (dog) hair and dander: Secondary | ICD-10-CM | POA: Diagnosis not present

## 2023-04-30 NOTE — Telephone Encounter (Signed)
..   Pt understands that although there may be some limitations with this type of visit, we will take all precautions to reduce any security or privacy concerns.  Pt understands that this will be treated like an in office visit and we will file with pt's insurance, and there may be a patient responsible charge related to this service. ? ?

## 2023-05-01 DIAGNOSIS — J301 Allergic rhinitis due to pollen: Secondary | ICD-10-CM | POA: Diagnosis not present

## 2023-05-01 DIAGNOSIS — J3089 Other allergic rhinitis: Secondary | ICD-10-CM | POA: Diagnosis not present

## 2023-05-01 DIAGNOSIS — J3081 Allergic rhinitis due to animal (cat) (dog) hair and dander: Secondary | ICD-10-CM | POA: Diagnosis not present

## 2023-05-03 ENCOUNTER — Ambulatory Visit: Payer: Medicare HMO | Admitting: Neurology

## 2023-05-03 DIAGNOSIS — G43709 Chronic migraine without aura, not intractable, without status migrainosus: Secondary | ICD-10-CM

## 2023-05-03 MED ORDER — ONABOTULINUMTOXINA 100 UNITS IJ SOLR
200.0000 [IU] | Freq: Once | INTRAMUSCULAR | Status: DC
Start: 2023-05-03 — End: 2024-04-08

## 2023-05-03 NOTE — Progress Notes (Signed)
[facility]  ° °Procedure Note Botox ° °Attending: Dr. Gurbani Figge ° °Preoperative Diagnosis(es): Chronic migraine ° °Consent obtained from: The patient °Benefits discussed included, but were not limited to decreased muscle tightness, increased joint range of motion, and decreased pain.  Risk discussed included, but were not limited pain and discomfort, bleeding, bruising, excessive weakness, venous thrombosis, muscle atrophy and dysphagia.  Anticipated outcomes of the procedure as well as he risks and benefits of the alternatives to the procedure, and the roles and tasks of the personnel to be involved, were discussed with the patient, and the patient consents to the procedure and agrees to proceed. A copy of the patient medication guide was given to the patient which explains the blackbox warning. ° °Patients identity and treatment sites confirmed Yes.  . ° °Details of Procedure: °Skin was cleaned with alcohol. Prior to injection, the needle plunger was aspirated to make sure the needle was not within a blood vessel.  There was no blood retrieved on aspiration.   ° °Following is a summary of the muscles injected  And the amount of Botulinum toxin used: ° °Dilution °200 units of Botox was reconstituted with 4 ml of preservative free normal saline. °Time of reconstitution: At the time of the office visit (<30 minutes prior to injection)  ° °Injections  °155 total units of Botox was injected with a 30 gauge needle. ° °Injection Sites: °L occipitalis: 15 units- 3 sites  °R occiptalis: 15 units- 3 sites ° °L upper trapezius: 15 units- 3 sites °R upper trapezius: 15 units- 3 sits          °L paraspinal: 10 units- 2 sites °R paraspinal: 10 units- 2 sites ° °Face °L frontalis(2 injection sites):10 units   °R frontalis(2 injection sites):10 units         °L corrugator: 5 units   °R corrugator: 5 units           °Procerus: 5 units   °L temporalis: 20 units °R temporalis: 20 units  ° °Agent:  °200 units of botulinum Type  A (Onobotulinum Toxin type A) was reconstituted with 4 ml of preservative free normal saline.  °Time of reconstitution: At the time of the office visit (<30 minutes prior to injection)  ° ° ° Total injected (Units):  155 ° Total wasted (Units):  45 ° °Patient tolerated procedure well without complications.   °Reinjection is anticipated in 3 months. ° ° °

## 2023-05-06 ENCOUNTER — Telehealth (INDEPENDENT_AMBULATORY_CARE_PROVIDER_SITE_OTHER): Payer: Medicare HMO | Admitting: Neurology

## 2023-05-06 DIAGNOSIS — G4701 Insomnia due to medical condition: Secondary | ICD-10-CM

## 2023-05-06 DIAGNOSIS — G4733 Obstructive sleep apnea (adult) (pediatric): Secondary | ICD-10-CM

## 2023-05-06 DIAGNOSIS — Z6841 Body Mass Index (BMI) 40.0 and over, adult: Secondary | ICD-10-CM

## 2023-05-06 DIAGNOSIS — G8929 Other chronic pain: Secondary | ICD-10-CM

## 2023-05-06 DIAGNOSIS — D496 Neoplasm of unspecified behavior of brain: Secondary | ICD-10-CM

## 2023-05-06 DIAGNOSIS — D432 Neoplasm of uncertain behavior of brain, unspecified: Secondary | ICD-10-CM

## 2023-05-06 MED ORDER — ONABOTULINUMTOXINA 100 UNITS IJ SOLR
200.0000 [IU] | Freq: Once | INTRAMUSCULAR | Status: AC
Start: 2023-05-06 — End: 2023-05-03
  Administered 2023-05-03: 155 [IU] via INTRAMUSCULAR

## 2023-05-06 MED ORDER — LIDOCAINE-PRILOCAINE 2.5-2.5 % EX CREA: 1.0000 | TOPICAL_CREAM | CUTANEOUS | 0 refills | Status: DC | PRN

## 2023-05-06 NOTE — Progress Notes (Signed)
Virtual Visit via Video Note  I connected with Virginia Luna on 05/06/23 at  9:30 AM EDT by a video enabled telemedicine application and verified that I am speaking with the correct person using two identifiers.  Location: Patient: at home Provider: at the [facility] office   DR Everlena Cooper, DO  is her primary neurologist.  PCP was Helane Rima , DO   I discussed the limitations of evaluation and management by telemedicine and the availability of in person appointments. The patient expressed understanding and agreed to proceed.  Patent receives migraine treatment, she was able to loose 50 pounds, she reported today, and she uses CPAP most days but has trouble to sleep and therefor low hourly compliance.  History of fibromyalgia,  chronic pain, history of frequent falls, allergies, tinnitus, balance issues-  History of cerebellar vascular tumor. 11/02/2021 18 x 12 mm enhancing superior cerebellar lesion. Differential considerations include a metastatic lesion in addition to a primary cerebellar neoplasm.     History of Present Illness:  History of  OSA  and reported pain related chronic Insomnia Onset in her twenties,    She  has a past medical history of Allergic Rhinitis , Anemia, OSTEO_Arthritis, childhood and allergic Asthma, Chronic pain, Complication of anesthesia, Diabetes (HCC), Diabetes mellitus without complication (HCC), Edema, lower extremity, Fibroid, Fibromyalgia, GERD (gastroesophageal reflux disease), Headache, High blood pressure, History of hiatal hernia, History of stomach ulcers, IBS (irritable bowel syndrome), Joint pain, Sleep apnea, and Wears contact lenses. Fall injuries with fracture of right wrist    The patient had the first sleep study in the year 2020  with a diagnosis of OSA,  dx at [facility] with dr Gerilyn Pilgrim.  She was placed on CPAP, but d/c after developing nausea. She reported aerophagia.  Nocturia/ Enuresis 2-3 times, Sleep walking and talking, no  Tonsillectomy, cervical spine DDD, no surgery.    Family medical /sleep history:  Brother on CPAP with OSA prior to his bariatric surgery- , insomnia sleep walking in siblings. .    Social history: Patient is disabled from [employer],  and lives in a household alone. Has a constant companion.  Family status is single- never married- Tobacco use; none.  ETOH use none,  Caffeine intake in form of Coffee( /) Soda( /) Tea ( 2 week) or energy drinks. Regular exercise in form of walking with friends.        Sleep habits are as follows: The patient's dinner time is between 6-6.30PM. The patient goes to bed at 9-10  PM and she struggles to sleep without medication ( this is a chronic condition and treated by Dr Tiburcio Pea, attributed to FIBROMYALGIA ) continues to sleep for 4-5  hours, wakes for several bathroom breaks   The preferred sleep position is supine, with the support of 2-3 pillows. Severe GERD causes arousals and sometimes she will vomit at night- constant nausea and feeling of fullness.  She wakes sometimes feeling paralyzed, scared by these spell.  Dreams are reportedly rare. Used to have nightmares.  5.30  AM is the usual rise time. The patient wakes up spontaneously 5 AM-before an alarm. She reports not feeling refreshed or restored in AM, with symptoms such as dry mouth, morning headaches, and residual fatigue.  Naps are taken infrequently after lunch, lasting from 10 to 30 minutes and are more refreshing than nocturnal sleep.    Medical interval history-  recent epistaxis, BP was supposingly good controlled.  OSA on CPAP : The  patient has been 80% compliant by days but only reached 40% compliance by hours with an average of 3 hours 38 minutes.  I would like for her to add another 30 to 40 minutes to her CPAP use each day.  Autotitration is set between a minimum pressure of 5 and a maximum pressure of 13 cm water with 3 cm EPR, residual AHI is 0.5/h which is excellent 95th percentile  pressure is 10.3 cm water.  95th percentile air leak is high 36.5 L a minute.  So there is definitely possible improvement for the fit of her interface and for the nightly duration of therapy.    Observations/Objective: I can't sleep because I am in overall pain - back pain, head pain, and  I can't sleep in bed and have to recline or sit up.  Fibromyalgia. Migraines.  She is in pain management.   Dr Everlena Cooper is her primary neurologist:  his list of medical problems.   1.Vestibular migraine/chronic migraine without aura, without status migrainosus, not intractable 2. Cerebellar hemangioblastoma, grade 1 status post resection  3.  Cerebellar tremor  4. Depression and anxiety  5  Black out spell (she zoned out) - unclear etiology.  While in rehab, she had episode of shaking which appeared to be restlessness in bed possibly due to her apnea.  No loss of consciousness.  Semiology not likely to be seizure. . B 12 deficiency.    Repeat MRI of brain scheduled for next month. Migraine prevention:  Botox. Every 3 months, Increase zonisamide to 300mg  daily (will see if helps with tremor as well) Stop rizatriptan.  She will try samples of Ubrelvy.  Zofran for nausea. Limit use of pain relievers to no more than 2 days out of week to prevent risk of rebound or medication-overuse headache. Keep headache diary Advised following up with psychiatry and psychology.   Assessment and Plan:  You lost 50 pounds ! Great .  We should recheck if apnea is still present .  HST  ordered.   Use your CPAP every night for 4 hours or more- ou can use it when you nap and when changing from bed to recliner.   Refitting for interface. \\ al prescriptions go to [facility], [employer] is closed.   Follow Up Instructions: Rv in  4-5 months with NP.     I discussed the assessment and treatment plan with the patient. The patient was provided an opportunity to ask questions and all were answered. The patient agreed with  the plan and demonstrated an understanding of the instructions.   The patient was advised to call back or seek an in-person evaluation if the symptoms worsen or if the condition fails to improve as anticipated.  I provided 29 minutes of non-face-to-face time during this encounter.   Melvyn Novas, MD

## 2023-05-06 NOTE — Patient Instructions (Signed)
Screening for Sleep Apnea  Sleep apnea is a condition in which breathing pauses or becomes shallow during sleep. Sleep apnea screening is a test to determine if you are at risk for sleep apnea. The test includes a series of questions. It will only takes a few minutes. Your health care provider may ask you to have this test in preparation for surgery or as part of a physical exam. What are the symptoms of sleep apnea? Common symptoms of sleep apnea include: Snoring. Waking up often at night. Daytime sleepiness. Pauses in breathing. Choking or gasping during sleep. Irritability. Forgetfulness. Trouble thinking clearly. Depression. Personality changes. Most people with sleep apnea do not know that they have it. What are the advantages of sleep apnea screening? Getting screened for sleep apnea can help: Ensure your safety. It is important for your health care providers to know whether or not you have sleep apnea, especially if you are having surgery or have other long-term (chronic) health conditions. Improve your health and allow you to get a better night's rest. Restful sleep can help you: Have more energy. Lose weight. Improve high blood pressure. Improve diabetes management. Prevent stroke. Prevent car accidents. What happens during the screening? Screening usually includes being asked a list of questions about your sleep quality. Some questions you may be asked include: Do you snore? Is your sleep restless? Do you have daytime sleepiness? Has a partner or spouse told you that you stop breathing during sleep? Have you had trouble concentrating or memory loss? What is your age? What is your neck circumference? To measure your neck, keep your back straight and gently wrap the tape measure around your neck. Put the tape measure at the middle of your neck, between your chin and collarbone. What is your sex assigned at birth? Do you have or are you being treated for high blood  pressure? If your screening test is positive, you are at risk for the condition. Further testing may be needed to confirm a diagnosis of sleep apnea. Where to find more information You can find screening tools online or at your health care clinic. For more information about sleep apnea screening and healthy sleep, visit these websites: Centers for Disease Control and Prevention: [employer] American Sleep Apnea Association: www.sleepapnea.org Contact a health care provider if: You think that you may have sleep apnea. Summary Sleep apnea screening can help determine if you are at risk for sleep apnea. It is important for your health care providers to know whether or not you have sleep apnea, especially if you are having surgery or have other chronic health conditions. You may be asked to take a screening test for sleep apnea in preparation for surgery or as part of a physical exam. This information is not intended to replace advice given to you by your health care provider. Make sure you discuss any questions you have with your health care provider. Document Revised: 07/15/2020 Document Reviewed: 07/15/2020 Elsevier Patient Education  2024 [employer].

## 2023-05-06 NOTE — Addendum Note (Signed)
Addended by: Leida Lauth on: 05/06/2023 08:01 AM   Modules accepted: Orders

## 2023-05-07 DIAGNOSIS — G4733 Obstructive sleep apnea (adult) (pediatric): Secondary | ICD-10-CM | POA: Diagnosis not present

## 2023-05-07 DIAGNOSIS — J3089 Other allergic rhinitis: Secondary | ICD-10-CM | POA: Diagnosis not present

## 2023-05-07 DIAGNOSIS — E119 Type 2 diabetes mellitus without complications: Secondary | ICD-10-CM | POA: Diagnosis not present

## 2023-05-07 DIAGNOSIS — M797 Fibromyalgia: Secondary | ICD-10-CM | POA: Diagnosis not present

## 2023-05-07 DIAGNOSIS — J3489 Other specified disorders of nose and nasal sinuses: Secondary | ICD-10-CM | POA: Diagnosis not present

## 2023-05-07 DIAGNOSIS — G43909 Migraine, unspecified, not intractable, without status migrainosus: Secondary | ICD-10-CM | POA: Diagnosis not present

## 2023-05-07 DIAGNOSIS — J342 Deviated nasal septum: Secondary | ICD-10-CM | POA: Diagnosis not present

## 2023-05-07 DIAGNOSIS — G894 Chronic pain syndrome: Secondary | ICD-10-CM | POA: Diagnosis not present

## 2023-05-07 DIAGNOSIS — J3081 Allergic rhinitis due to animal (cat) (dog) hair and dander: Secondary | ICD-10-CM | POA: Diagnosis not present

## 2023-05-07 DIAGNOSIS — J343 Hypertrophy of nasal turbinates: Secondary | ICD-10-CM | POA: Diagnosis not present

## 2023-05-07 DIAGNOSIS — J301 Allergic rhinitis due to pollen: Secondary | ICD-10-CM | POA: Diagnosis not present

## 2023-05-08 ENCOUNTER — Telehealth: Payer: Self-pay | Admitting: Neurology

## 2023-05-08 NOTE — Telephone Encounter (Signed)
Patient called stating that she would like a call back from nurse.

## 2023-05-09 NOTE — Telephone Encounter (Signed)
Noted, thank you. Cancelled order at this time.

## 2023-05-10 NOTE — Telephone Encounter (Signed)
Patient wanted to know if we received records.    Advise patient I haven't received anything for her.

## 2023-05-13 ENCOUNTER — Telehealth: Payer: Self-pay | Admitting: Neurology

## 2023-05-13 NOTE — Telephone Encounter (Signed)
Patient left a voicemail on my phone stating that she would like to hold off on doing the sleep study at this time. Because she just found out she has a narrow in her sinus area and will need surgery. She doesn't want to do anything until that is addressed.

## 2023-05-13 NOTE — Telephone Encounter (Signed)
noted 

## 2023-05-14 DIAGNOSIS — J3089 Other allergic rhinitis: Secondary | ICD-10-CM | POA: Diagnosis not present

## 2023-05-14 DIAGNOSIS — J3081 Allergic rhinitis due to animal (cat) (dog) hair and dander: Secondary | ICD-10-CM | POA: Diagnosis not present

## 2023-05-14 DIAGNOSIS — J301 Allergic rhinitis due to pollen: Secondary | ICD-10-CM | POA: Diagnosis not present

## 2023-05-16 ENCOUNTER — Telehealth: Payer: Self-pay | Admitting: Neurology

## 2023-05-16 NOTE — Telephone Encounter (Signed)
Pt called needing to speak to Sleep Lab about the repeat sleep test was going to be request for her. Pt states that she will be needing surgery in her sinuses and is needing to discuss with the sleep lab. Please advise.

## 2023-05-16 NOTE — Telephone Encounter (Signed)
Returned pt call, pt was calling to confirm that Dr. Vickey Huger was aware she needed to hold off on her sleep study due to her needing to have surgery. The patient stated she had not heard back from anyone from Dr. Oliva Bustard team since she called in on Monday (9/23) - I let her know the sleep lab did receive a call and let Dr. Oliva Bustard team know and it looks like someone from Dr. Oliva Bustard team made note of it. I let the pt know when she was ready to have her sleep study to give [name] a call back.

## 2023-05-21 DIAGNOSIS — J3081 Allergic rhinitis due to animal (cat) (dog) hair and dander: Secondary | ICD-10-CM | POA: Diagnosis not present

## 2023-05-21 DIAGNOSIS — J3089 Other allergic rhinitis: Secondary | ICD-10-CM | POA: Diagnosis not present

## 2023-05-21 DIAGNOSIS — J301 Allergic rhinitis due to pollen: Secondary | ICD-10-CM | POA: Diagnosis not present

## 2023-05-23 DIAGNOSIS — G894 Chronic pain syndrome: Secondary | ICD-10-CM | POA: Diagnosis not present

## 2023-05-23 DIAGNOSIS — M797 Fibromyalgia: Secondary | ICD-10-CM | POA: Diagnosis not present

## 2023-05-23 DIAGNOSIS — E7800 Pure hypercholesterolemia, unspecified: Secondary | ICD-10-CM | POA: Diagnosis not present

## 2023-05-23 DIAGNOSIS — E119 Type 2 diabetes mellitus without complications: Secondary | ICD-10-CM | POA: Diagnosis not present

## 2023-05-23 DIAGNOSIS — Z23 Encounter for immunization: Secondary | ICD-10-CM | POA: Diagnosis not present

## 2023-05-23 DIAGNOSIS — I10 Essential (primary) hypertension: Secondary | ICD-10-CM | POA: Diagnosis not present

## 2023-05-23 DIAGNOSIS — Z86018 Personal history of other benign neoplasm: Secondary | ICD-10-CM | POA: Diagnosis not present

## 2023-05-23 DIAGNOSIS — J4520 Mild intermittent asthma, uncomplicated: Secondary | ICD-10-CM | POA: Diagnosis not present

## 2023-05-23 DIAGNOSIS — G43909 Migraine, unspecified, not intractable, without status migrainosus: Secondary | ICD-10-CM | POA: Diagnosis not present

## 2023-05-24 ENCOUNTER — Encounter: Payer: Self-pay | Admitting: Physical Medicine and Rehabilitation

## 2023-05-24 ENCOUNTER — Encounter: Payer: Medicare HMO | Attending: Physical Medicine and Rehabilitation | Admitting: Physical Medicine and Rehabilitation

## 2023-05-24 VITALS — BP 155/82 | HR 86 | Ht 60.0 in | Wt 238.0 lb

## 2023-05-24 DIAGNOSIS — G894 Chronic pain syndrome: Secondary | ICD-10-CM | POA: Diagnosis not present

## 2023-05-24 DIAGNOSIS — D496 Neoplasm of unspecified behavior of brain: Secondary | ICD-10-CM | POA: Diagnosis not present

## 2023-05-24 DIAGNOSIS — M7918 Myalgia, other site: Secondary | ICD-10-CM

## 2023-05-24 DIAGNOSIS — R110 Nausea: Secondary | ICD-10-CM

## 2023-05-24 MED ORDER — LIDOCAINE HCL 1 % IJ SOLN
6.0000 mL | Freq: Once | INTRAMUSCULAR | Status: DC
Start: 2023-05-24 — End: 2024-04-08

## 2023-05-24 NOTE — Patient Instructions (Addendum)
Plan: Patient here for trigger point injections for myofascial pain/HA's.   Consent done and on chart.  Cleaned areas with alcohol and injected using a 27 gauge 1.5 inch needle  Injected 6cc none wasted Using 1% Lidocaine with no EPI  Upper traps B/L  Levators B/L  Posterior scalenes Middle scalenes- b/L x2 Splenius Capitus Pectoralis Major b/l Rhomboids B?l x2 Infraspinatus Teres Major/minor Thoracic paraspinals Lumbar paraspinals b/L  Other injections- B/ hands   Patient's level of pain prior was Current level of pain after injections is  There was no bleeding or complications.  Patient was advised to drink a lot of water on day after injections to flush system Will have increased soreness for 12-48 hours after injections.  Can use Lidocaine patches the day AFTER injections Can use theracane on day of injections in places didn't inject Can use heating pad 4-6 hours AFTER injections  2. Con't Trileptal/oxcarmazepine- 900 mg nightly- for nerve pain. Doesn't need refills   3. Con't Zofran as needed for N/V- has refills.    4. Handicapped placard for 5 years with needing medical signature.   5. F/U in 6 weeks-  trP injections and f/u on pain-  Make multiple appointments.   6. Will wait to see if needs more PT- if has surgery on sinuses- needs due to being off balance.

## 2023-05-24 NOTE — Progress Notes (Signed)
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 Also has migraines- intractable and daily; has trace aura;  No kidney issues- Cr 0.65 and BUN 15 in 6/22. Also end stage OA of B/L knees medially and R hip pain. Also has myofascial pain syndrome.  Has new brain mass that was documented Worsening cerebellar Sx's. 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  Feeling not great, but "can get through it".   Fell back on sofa-  right after saw me last-   Felt bad all day, and just couldn't avoid it.  Had a "crazy" feeling and head felt "weird".  That day.  Cannot describe it.  Thank goodness on sofa, and felt better afterwards.   Also taking Vit B12 now  Dr Earlene Plater ordered- shots.   Continuing Trileptal 900 mg nightly.  Dr Tiburcio Pea put back on PPI 2x/day.   Taking Zofran as needed for nausea-  Takes a couple times per week usually.  Too late to take sometimes already vomited.   Seeing ENT- very narrow opening into sinuses-  Cannot get scope in it- B/L sides Wants to do surgery.  Cannot keep CPAP in place because of sinus pressure- explains a lot.    Exam: Awake, alert, appropriate, NAD Using RW to walk Scoliosis in thoracic spine-  goes to Right first above bra line- ~ T4-5  Plan: Patient here for trigger point injections for myofascial pain/HA's.   Consent done and on chart.  Cleaned areas with alcohol and injected using a 27 gauge 1.5 inch needle  Injected 6cc none wasted Using 1% Lidocaine with no EPI  Upper traps B/L  Levators B/L  Posterior scalenes Middle scalenes- b/L x2 Splenius Capitus Pectoralis Major b/l Rhomboids B?l x2 Infraspinatus Teres Major/minor Thoracic paraspinals Lumbar paraspinals b/L  Other injections- B/ hands   Patient's level of pain prior was Current  level of pain after injections is  There was no bleeding or complications.  Patient was advised to drink a lot of water on day after injections to flush system Will have increased soreness for 12-48 hours after injections.  Can use Lidocaine patches the day AFTER injections Can use theracane on day of injections in places didn't inject Can use heating pad 4-6 hours AFTER injections  2. Con't Trileptal/oxcarmazepine- 900 mg nightly- for nerve pain. Doesn't need refills   3. Con't Zofran as needed for N/V- has refills.    4. Handicapped placard for 5 years with needing medical signature.   5. F/U in 6 weeks-  trP injections and f/u on pain-  Make multiple appointments.   6. Will wait to see if needs more PT- if has surgery on sinuses- needs due to being off balance.    I spent a total of  28  minutes on total care today- >50% coordination of care- due to 6 minutes on injections- rst d/w pt about handicapped placard, dizziness, fall and nausea.

## 2023-05-28 DIAGNOSIS — Z6841 Body Mass Index (BMI) 40.0 and over, adult: Secondary | ICD-10-CM | POA: Diagnosis not present

## 2023-05-28 DIAGNOSIS — J3081 Allergic rhinitis due to animal (cat) (dog) hair and dander: Secondary | ICD-10-CM | POA: Diagnosis not present

## 2023-05-28 DIAGNOSIS — J4520 Mild intermittent asthma, uncomplicated: Secondary | ICD-10-CM | POA: Diagnosis not present

## 2023-05-28 DIAGNOSIS — J3089 Other allergic rhinitis: Secondary | ICD-10-CM | POA: Diagnosis not present

## 2023-05-28 DIAGNOSIS — G43019 Migraine without aura, intractable, without status migrainosus: Secondary | ICD-10-CM | POA: Diagnosis not present

## 2023-05-28 DIAGNOSIS — J342 Deviated nasal septum: Secondary | ICD-10-CM | POA: Diagnosis not present

## 2023-05-28 DIAGNOSIS — E1169 Type 2 diabetes mellitus with other specified complication: Secondary | ICD-10-CM | POA: Diagnosis not present

## 2023-05-28 DIAGNOSIS — E66813 Obesity, class 3: Secondary | ICD-10-CM | POA: Diagnosis not present

## 2023-05-28 DIAGNOSIS — J301 Allergic rhinitis due to pollen: Secondary | ICD-10-CM | POA: Diagnosis not present

## 2023-05-28 DIAGNOSIS — K5909 Other constipation: Secondary | ICD-10-CM | POA: Diagnosis not present

## 2023-06-04 DIAGNOSIS — J301 Allergic rhinitis due to pollen: Secondary | ICD-10-CM | POA: Diagnosis not present

## 2023-06-04 DIAGNOSIS — J3081 Allergic rhinitis due to animal (cat) (dog) hair and dander: Secondary | ICD-10-CM | POA: Diagnosis not present

## 2023-06-04 DIAGNOSIS — J3089 Other allergic rhinitis: Secondary | ICD-10-CM | POA: Diagnosis not present

## 2023-06-11 DIAGNOSIS — J301 Allergic rhinitis due to pollen: Secondary | ICD-10-CM | POA: Diagnosis not present

## 2023-06-11 DIAGNOSIS — J3081 Allergic rhinitis due to animal (cat) (dog) hair and dander: Secondary | ICD-10-CM | POA: Diagnosis not present

## 2023-06-11 DIAGNOSIS — J3089 Other allergic rhinitis: Secondary | ICD-10-CM | POA: Diagnosis not present

## 2023-06-14 DIAGNOSIS — D3000 Benign neoplasm of unspecified kidney: Secondary | ICD-10-CM | POA: Diagnosis not present

## 2023-06-14 DIAGNOSIS — R8279 Other abnormal findings on microbiological examination of urine: Secondary | ICD-10-CM | POA: Diagnosis not present

## 2023-06-14 DIAGNOSIS — N3000 Acute cystitis without hematuria: Secondary | ICD-10-CM | POA: Diagnosis not present

## 2023-06-15 DIAGNOSIS — R051 Acute cough: Secondary | ICD-10-CM | POA: Diagnosis not present

## 2023-06-15 DIAGNOSIS — J189 Pneumonia, unspecified organism: Secondary | ICD-10-CM | POA: Diagnosis not present

## 2023-06-20 DIAGNOSIS — J301 Allergic rhinitis due to pollen: Secondary | ICD-10-CM | POA: Diagnosis not present

## 2023-06-20 DIAGNOSIS — J3081 Allergic rhinitis due to animal (cat) (dog) hair and dander: Secondary | ICD-10-CM | POA: Diagnosis not present

## 2023-06-20 DIAGNOSIS — J3089 Other allergic rhinitis: Secondary | ICD-10-CM | POA: Diagnosis not present

## 2023-06-23 DIAGNOSIS — R051 Acute cough: Secondary | ICD-10-CM | POA: Diagnosis not present

## 2023-06-24 ENCOUNTER — Ambulatory Visit: Payer: Medicare HMO | Admitting: Family Medicine

## 2023-06-27 DIAGNOSIS — J3089 Other allergic rhinitis: Secondary | ICD-10-CM | POA: Diagnosis not present

## 2023-06-27 DIAGNOSIS — J301 Allergic rhinitis due to pollen: Secondary | ICD-10-CM | POA: Diagnosis not present

## 2023-06-27 DIAGNOSIS — J3081 Allergic rhinitis due to animal (cat) (dog) hair and dander: Secondary | ICD-10-CM | POA: Diagnosis not present

## 2023-07-02 DIAGNOSIS — J3081 Allergic rhinitis due to animal (cat) (dog) hair and dander: Secondary | ICD-10-CM | POA: Diagnosis not present

## 2023-07-02 DIAGNOSIS — J301 Allergic rhinitis due to pollen: Secondary | ICD-10-CM | POA: Diagnosis not present

## 2023-07-02 DIAGNOSIS — J3089 Other allergic rhinitis: Secondary | ICD-10-CM | POA: Diagnosis not present

## 2023-07-08 ENCOUNTER — Encounter: Payer: Medicare HMO | Attending: Physical Medicine and Rehabilitation | Admitting: Physical Medicine and Rehabilitation

## 2023-07-08 ENCOUNTER — Encounter: Payer: Self-pay | Admitting: Physical Medicine and Rehabilitation

## 2023-07-08 VITALS — BP 130/83 | HR 94 | Ht 60.0 in | Wt 234.0 lb

## 2023-07-08 DIAGNOSIS — D496 Neoplasm of unspecified behavior of brain: Secondary | ICD-10-CM

## 2023-07-08 DIAGNOSIS — R110 Nausea: Secondary | ICD-10-CM | POA: Diagnosis not present

## 2023-07-08 DIAGNOSIS — M5432 Sciatica, left side: Secondary | ICD-10-CM | POA: Diagnosis not present

## 2023-07-08 DIAGNOSIS — G894 Chronic pain syndrome: Secondary | ICD-10-CM | POA: Diagnosis not present

## 2023-07-08 DIAGNOSIS — M7918 Myalgia, other site: Secondary | ICD-10-CM

## 2023-07-08 MED ORDER — LIDOCAINE HCL 1 % IJ SOLN
6.0000 mL | Freq: Once | INTRAMUSCULAR | Status: AC
Start: 2023-07-08 — End: 2023-07-08
  Administered 2023-07-08: 6 mL

## 2023-07-08 NOTE — Progress Notes (Signed)
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 Also has migraines- intractable and daily; has trace aura;  No kidney issues- Cr 0.65 and BUN 15 in 6/22. Also end stage OA of B/L knees medially and R hip pain. Also has myofascial pain syndrome.  Has new brain mass that was documented Worsening cerebellar Sx's. 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   Usually lasts - sometimes weeks- sometimes days.    Hasn't had surgery on sinuses-   Needs more handicapped placard paperwork. Lost it.    Plan 1. Con't Trileptal and Zofran- has refills.    2. Patient here for trigger point injections for   Consent done and on chart.  Cleaned areas with alcohol and injected using a 27 gauge 1.5 inch needle  Injected 6cc - none wasted Using 1% Lidocaine with no EPI  Upper traps-B/L  L side causes little knot due to SubQ Levators B/L  Posterior scalenes- B/L  Middle scalenes B/L  Splenius Capitus Pectoralis Major- B/L  Rhomboids- B/L x and R side 2 more times Infraspinatus Teres Major/minor Thoracic paraspinals B/L  Lumbar paraspinals- B/L  Other injections- B/L hands   Patient's level of pain prior was  9/10 Current level of pain after injections is- down to 8.5 already  There was no bleeding or complications.  Patient was advised to drink a lot of water on day after injections to flush system Will have increased soreness for 12-48 hours after injections.  Can use Lidocaine patches the day AFTER injections Can use theracane on day of injections in places didn't inject Can use heating pad 4-6 hours AFTER injections   3. Doesn't need refills today- cont meds as above.    4. F/U in 6 weeks- trp injections and f/u on chronic pain   5. Discussed sinus  surgery- seeing Dr Earlene Plater tomorrow to discuss further

## 2023-07-08 NOTE — Patient Instructions (Addendum)
Plan 1. Con't Trileptal and Zofran- has refills.    2. Patient here for trigger point injections for   Consent done and on chart.  Cleaned areas with alcohol and injected using a 27 gauge 1.5 inch needle  Injected 6cc - none wasted Using 1% Lidocaine with no EPI  Upper traps-B/L  L side causes little knot due to SubQ Levators B/L  Posterior scalenes- B/L  Middle scalenes B/L  Splenius Capitus Pectoralis Major- B/L  Rhomboids- B/L x and R side 2 more times Infraspinatus Teres Major/minor Thoracic paraspinals B/L  Lumbar paraspinals- B/L  Other injections- B/L hands   Patient's level of pain prior was  9/10 Current level of pain after injections is- down to 8.5 already  There was no bleeding or complications.  Patient was advised to drink a lot of water on day after injections to flush system Will have increased soreness for 12-48 hours after injections.  Can use Lidocaine patches the day AFTER injections Can use theracane on day of injections in places didn't inject Can use heating pad 4-6 hours AFTER injections   3. Doesn't need refills today- cont meds as above.    4. F/U in 6 weeks- trp injections and f/u on chronic pain   5. Discussed sinus surgery- seeing Dr Earlene Plater tomorrow to discuss further  6. Gave handicapped paperwork again. 5 years

## 2023-07-09 DIAGNOSIS — J3081 Allergic rhinitis due to animal (cat) (dog) hair and dander: Secondary | ICD-10-CM | POA: Diagnosis not present

## 2023-07-09 DIAGNOSIS — J301 Allergic rhinitis due to pollen: Secondary | ICD-10-CM | POA: Diagnosis not present

## 2023-07-09 DIAGNOSIS — J3089 Other allergic rhinitis: Secondary | ICD-10-CM | POA: Diagnosis not present

## 2023-07-10 ENCOUNTER — Ambulatory Visit: Payer: Medicare HMO | Admitting: Family Medicine

## 2023-07-10 DIAGNOSIS — K5909 Other constipation: Secondary | ICD-10-CM | POA: Diagnosis not present

## 2023-07-10 DIAGNOSIS — G4733 Obstructive sleep apnea (adult) (pediatric): Secondary | ICD-10-CM | POA: Diagnosis not present

## 2023-07-10 DIAGNOSIS — E538 Deficiency of other specified B group vitamins: Secondary | ICD-10-CM | POA: Diagnosis not present

## 2023-07-10 DIAGNOSIS — N390 Urinary tract infection, site not specified: Secondary | ICD-10-CM | POA: Diagnosis not present

## 2023-07-10 DIAGNOSIS — E65 Localized adiposity: Secondary | ICD-10-CM | POA: Diagnosis not present

## 2023-07-10 DIAGNOSIS — Z6841 Body Mass Index (BMI) 40.0 and over, adult: Secondary | ICD-10-CM | POA: Diagnosis not present

## 2023-07-10 DIAGNOSIS — Z1331 Encounter for screening for depression: Secondary | ICD-10-CM | POA: Diagnosis not present

## 2023-07-10 DIAGNOSIS — E1169 Type 2 diabetes mellitus with other specified complication: Secondary | ICD-10-CM | POA: Diagnosis not present

## 2023-07-10 DIAGNOSIS — I10 Essential (primary) hypertension: Secondary | ICD-10-CM | POA: Diagnosis not present

## 2023-07-15 DIAGNOSIS — J3081 Allergic rhinitis due to animal (cat) (dog) hair and dander: Secondary | ICD-10-CM | POA: Diagnosis not present

## 2023-07-15 DIAGNOSIS — J301 Allergic rhinitis due to pollen: Secondary | ICD-10-CM | POA: Diagnosis not present

## 2023-07-15 DIAGNOSIS — J3089 Other allergic rhinitis: Secondary | ICD-10-CM | POA: Diagnosis not present

## 2023-07-23 DIAGNOSIS — J3089 Other allergic rhinitis: Secondary | ICD-10-CM | POA: Diagnosis not present

## 2023-07-23 DIAGNOSIS — J301 Allergic rhinitis due to pollen: Secondary | ICD-10-CM | POA: Diagnosis not present

## 2023-07-23 DIAGNOSIS — J3081 Allergic rhinitis due to animal (cat) (dog) hair and dander: Secondary | ICD-10-CM | POA: Diagnosis not present

## 2023-07-30 DIAGNOSIS — J3089 Other allergic rhinitis: Secondary | ICD-10-CM | POA: Diagnosis not present

## 2023-07-30 DIAGNOSIS — J301 Allergic rhinitis due to pollen: Secondary | ICD-10-CM | POA: Diagnosis not present

## 2023-07-30 DIAGNOSIS — J3081 Allergic rhinitis due to animal (cat) (dog) hair and dander: Secondary | ICD-10-CM | POA: Diagnosis not present

## 2023-08-06 DIAGNOSIS — J301 Allergic rhinitis due to pollen: Secondary | ICD-10-CM | POA: Diagnosis not present

## 2023-08-06 DIAGNOSIS — J3081 Allergic rhinitis due to animal (cat) (dog) hair and dander: Secondary | ICD-10-CM | POA: Diagnosis not present

## 2023-08-06 DIAGNOSIS — J3089 Other allergic rhinitis: Secondary | ICD-10-CM | POA: Diagnosis not present

## 2023-08-07 ENCOUNTER — Telehealth: Payer: Self-pay | Admitting: Neurology

## 2023-08-07 NOTE — Telephone Encounter (Signed)
Jocelyn from [facility] needs to speak with sheena. They need to know if jaffe agrees or disagrees with ENT [facility] notes about reconstructive surgery.

## 2023-08-09 ENCOUNTER — Ambulatory Visit: Payer: Medicare HMO | Admitting: Neurology

## 2023-08-09 ENCOUNTER — Ambulatory Visit (INDEPENDENT_AMBULATORY_CARE_PROVIDER_SITE_OTHER): Payer: Medicare HMO | Admitting: Neurology

## 2023-08-09 DIAGNOSIS — G43709 Chronic migraine without aura, not intractable, without status migrainosus: Secondary | ICD-10-CM | POA: Diagnosis not present

## 2023-08-09 MED ORDER — ONABOTULINUMTOXINA 100 UNITS IJ SOLR
200.0000 [IU] | Freq: Once | INTRAMUSCULAR | Status: AC
  Administered 2023-08-09: 155 [IU] via INTRAMUSCULAR

## 2023-08-09 NOTE — Progress Notes (Signed)

## 2023-08-09 NOTE — Telephone Encounter (Signed)
[facility] closed on Fridays.

## 2023-08-15 NOTE — Telephone Encounter (Signed)
Office closed due to the holiday,   Per Dr.JAffe I spoke with Shaune Pascal and reviewed the ENT note today.  I agree with the surgery.

## 2023-08-16 DIAGNOSIS — J3081 Allergic rhinitis due to animal (cat) (dog) hair and dander: Secondary | ICD-10-CM | POA: Diagnosis not present

## 2023-08-16 DIAGNOSIS — J3089 Other allergic rhinitis: Secondary | ICD-10-CM | POA: Diagnosis not present

## 2023-08-16 DIAGNOSIS — J301 Allergic rhinitis due to pollen: Secondary | ICD-10-CM | POA: Diagnosis not present

## 2023-08-19 ENCOUNTER — Telehealth: Payer: Self-pay

## 2023-08-19 ENCOUNTER — Encounter: Payer: Medicare HMO | Attending: Physical Medicine and Rehabilitation | Admitting: Physical Medicine and Rehabilitation

## 2023-08-19 ENCOUNTER — Encounter: Payer: Self-pay | Admitting: Physical Medicine and Rehabilitation

## 2023-08-19 VITALS — BP 151/89 | HR 91 | Ht 60.0 in | Wt 235.0 lb

## 2023-08-19 DIAGNOSIS — M797 Fibromyalgia: Secondary | ICD-10-CM | POA: Diagnosis not present

## 2023-08-19 DIAGNOSIS — D496 Neoplasm of unspecified behavior of brain: Secondary | ICD-10-CM | POA: Diagnosis not present

## 2023-08-19 DIAGNOSIS — G894 Chronic pain syndrome: Secondary | ICD-10-CM | POA: Diagnosis not present

## 2023-08-19 DIAGNOSIS — M7918 Myalgia, other site: Secondary | ICD-10-CM | POA: Insufficient documentation

## 2023-08-19 MED ORDER — LIDOCAINE HCL 1 % IJ SOLN: 6.0000 mL | Freq: Once | INTRAMUSCULAR | Status: DC

## 2023-08-19 NOTE — Patient Instructions (Signed)
Plan:  Has Zofran and Trileptal from 04/12/23- so shouldn't need a refill.   2. Patient here for trigger point injections for  Consent done and on chart.  Cleaned areas with alcohol and injected using a 27 gauge 1.5 inch needle  Injected 6 cc- none wasted Using 1% Lidocaine with no EPI  Upper traps B/L  Levators- BL  Posterior scalenes Middle scalenes- B/L - x2- upper and lower Splenius Capitus- R side only Pectoralis Major- B/L  Rhomboids Infraspinatus Teres Major/minor Thoracic paraspinals B/L- lower thoracic Lumbar paraspinals B/L x3-  Other injections-    Patient's level of pain prior was Current level of pain after injections is- starting to relax some-   There was no bleeding or complications.  Patient was advised to drink a lot of water on day after injections to flush system Will have increased soreness for 12-48 hours after injections.  Can use Lidocaine patches the day AFTER injections Can use theracane on day of injections in places didn't inject Can use heating pad 4-6 hours AFTER injections  3. F/u in 6 weeks- for trp Injections and f/u on FMS/chronic pain.

## 2023-08-19 NOTE — Telephone Encounter (Signed)
[facility] called an asked about surgery for pt, she was informed that Dr Everlena Cooper stated he agreed with the surgery.

## 2023-08-19 NOTE — Progress Notes (Signed)
Pt is a 62 yr old female with hx of fibromyalgia- dx'd at age 77, DM2,- Ac1 6.2;  asthma; HTN, OSA, BMI is 50; Nodule in L superior cerebellum. Also has migraines- intractable and daily; has trace aura;  No kidney issues- Cr 0.65 and BUN 15 in 6/22. Also end stage OA of B/L knees medially and R hip pain. Also has myofascial pain syndrome.  Has new brain mass that was documented Worsening cerebellar Sx's. 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   Just had Botox injections a few days ago by Dr Everlena Cooper.   HA's are doing OK- not too bad.  Back and neck pain winning in terms of pain.    Has been vomiting due to pain.   Plan:  Has Zofran and Trileptal from 04/12/23- so shouldn't need a refill.   2. Patient here for trigger point injections for  Consent done and on chart.  Cleaned areas with alcohol and injected using a 27 gauge 1.5 inch needle  Injected 6 cc- none wasted Using 1% Lidocaine with no EPI  Upper traps B/L  Levators- BL  Posterior scalenes Middle scalenes- B/L - x2- upper and lower Splenius Capitus- R side only Pectoralis Major- B/L  Rhomboids Infraspinatus Teres Major/minor Thoracic paraspinals B/L- lower thoracic Lumbar paraspinals B/L x3-  Other injections-    Patient's level of pain prior was Current level of pain after injections is- starting to relax some-   There was no bleeding or complications.  Patient was advised to drink a lot of water on day after injections to flush system Will have increased soreness for 12-48 hours after injections.  Can use Lidocaine patches the day AFTER injections Can use theracane on day of injections in places didn't inject Can use heating pad 4-6 hours AFTER injections  3. F/u in 6 weeks- for trp Injections and f/u on FMS/chronic pain.

## 2023-08-20 ENCOUNTER — Other Ambulatory Visit: Payer: Self-pay | Admitting: Otolaryngology

## 2023-08-22 DIAGNOSIS — J3089 Other allergic rhinitis: Secondary | ICD-10-CM | POA: Diagnosis not present

## 2023-08-22 DIAGNOSIS — J3081 Allergic rhinitis due to animal (cat) (dog) hair and dander: Secondary | ICD-10-CM | POA: Diagnosis not present

## 2023-08-22 DIAGNOSIS — J301 Allergic rhinitis due to pollen: Secondary | ICD-10-CM | POA: Diagnosis not present

## 2023-08-22 NOTE — Progress Notes (Signed)
 NEUROLOGY FOLLOW UP OFFICE NOTE  SHUNTAE HERZIG [id number]  Assessment/Plan:   1.Vestibular migraine/chronic migraine without aura, without status migrainosus, not intractable 2. Cerebellar hemangioblastoma, grade 1 status post resection  3.  Cerebellar tremor, stable 4. Depression and anxiety  5  Transient episode of passing out - possibly vasovagal syncope 6  Black out spell (she zoned out) - unclear etiology.  While in rehab, she had episode of shaking which appeared to be restlessness in bed possibly due to her apnea.  No loss of consciousness.  Semiology not likely to be seizure.   Due to syncope, check EEG Migraine prevention:  Botox . Every 3 months, Increase zonisamide  to 300mg  daily (will see if helps with tremor as well) Ubrelvy , naratriptan .  Zofran  for nausea. Limit use of pain relievers to no more than 2 days out of week to prevent risk of rebound or medication-overuse headache. Keep headache diary Advised following up with psychiatry and psychology. Follow up for Botox  Follow up 6 months for office visit     Subjective:  Cydni Reddoch is a 63 year old right-handed female with cerebellar hemangioblastoma, OSA, diabetes, chronic back pain, and IBS who follows up for migraine.   UPDATE: Repeat MRI of brain on 03/16/2023 personally reviewed revealed postsurgical changes in the cerebellum without evidence of recurrent tumor.  Done without contrast because couldn't get IV access.  She did have follow up MRI of brain with contrast on 04/16/2023 which confirmed no recurrence of tumor.    Migraines occur about 2 times a month.  Aborts quickly.  She had an episode recently.  She had her hair done.  It was aggravating the discomfort in the back of her neck on the left.  When she left, she suddenly passed out for a couple of seconds.  No postictal confusion.  She thinks she may have had some urinary incontinence.   Current NSAIDS/analgesics:  Hydrocodone-acetaminophen   (pain), diclofenac 75mg  Current triptans:  naratriptan  2.5mg  Current ergotamine:  none Current anti-emetic:  Zofran  ODT 4mg  Current muscle relaxants:  Robaxin  Current Antihypertensive medications:  Metoprolol succinate, amlodipine  Current Antidepressant medications:  none Current Anticonvulsant medications: gabapentin  300mg  BID, oxcarbazepine  900mg  at bedtime (for chronic pain), zonisamide  300mg  daily Current anti-CGRP:  Ubrelvy  100mg  Current Vitamins/Herbal/Supplements:  D, melatonin Current Antihistamines/Decongestants:  Meclizine  25mg  PRN, Benadryl  50mg  QHS, Flonase Other therapy:  Botox  Hormone/birth control:  none Other medications:  Ambien , Seroquel    Caffeine:  No coffee.  Occasional Coke Diet:  1 gallon water daily.  Tries not to skip meals.   Exercise:  Unable due to pain, nausea and dizziness Depression/Anxiety:  yes Other pain:  fibromyalgia - pain is severe everyday.   Sleep hygiene: Has mild OSA but with severe sleep hypoxia.  Started CPAP and now adequately treated.     HISTORY:  She has had migraines for many years.  They were manageable, usually occurring once a month.  They started to become more frequent in 2020, progressing to the point that she now has a persistent daily headache.  They are typically bifrontal and pounding, associated with nausea, vomiting, vertigo, photophobia, phonophobia and blurred vision but no numbness or weakness.  Intensity will fluctuate from dull-moderate to severe about once a week for 2-3 days.  Sunlight, loud noise and quick movements are aggravating factors.  Resting in a dark and cool quiet room helps relieve pain.  She cannot really identify a specific trigger for her chronic daily headache but it may have followed a fall in  which she tripped and hit her head.   She treats headache with sumatriptan  (2-3 days a week) and treats nausea with Zofran  and dizziness with meclizine .  She also takes hydrocodone for fibromyalgia.    MRI of brain  with and without contrast on 09/03/2020 showed enhancing 8 x 6 mm posterior fossa mass left of midline with edema within the superior cerebellum.  Follow up MRI on 09/24/2020 was stable.  She was referred to neurosurgery who favored to closely monitor.  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 Repeat MRI of brain with and without contrast on 09/03/2021 personally reviewed showed increase size of the left superior cerebellar vermis mass from 1.1 x 1.0 cm to 1.5 x 1.5 cm with mild increased surrounding edema.  Plan was to undergo surgery with Dr. Joshua.  However, she presented to [facility] in [place] with tremors and increased headaches.  She underwent craniotomy and tumor resection there with Dr. Curtistine Hora of neurosurgery.  Postoperative MRI showed small area of diffusion restriction in the cerebellum.  No complications.  Biopsy revealed grade 1  hemangioblastoma.  She was discharged to inpatient rehab where it was noted to have bilateral upper and lower extremity thrashing without loss of consciousness at night while in bed.  Evalauted by neurology who did not suspect seizure but rather secondary to her untreated sleep apnea. Started CPAP.  She finds Botox  to be helpful.  Headaches are no longer diffuse, just left sided although it can be painful.  Occurring 15 days a month.  She reports that she had an episode where she blacked out.  She was sitting watching TV while talking to a friend on the phone and the next thing she knew, something else was playing on the TV suggesting time elapsed.  She was still on the phone with her friend. Started having worsening headache in June 2023.  Seen in ED on 02/07/2022.  CTA head personally reviewed was unremarkable.  MRI of brain with and without contrast personally reviewed limited by motion artifact but showed no evidence of residual or recurrent mass in the resection within the left cerebellar  hemisphere.     Past NSAIDS/analgesics:  Ibuprofen, naproxen, Excedrin/BC/Goody Past abortive triptans:  sumatriptan   Past abortive ergotamine:  none Past muscle relaxants:  Flexeril Past anti-emetic:  Promethazine  25mg  Past antihypertensive medications:  none Past antidepressant medications:  Amitriptyline or nortriptyline, venlafaxine Past anticonvulsant medications:  topiramate, gabapentin  Past anti-CGRP:  Emgality  Past vitamins/Herbal/Supplements:  none Past antihistamines/decongestants:  none Other past therapies:  vestibular rehab     Family history of headache:  unknown  PAST MEDICAL HISTORY: Past Medical History:  Diagnosis Date   Allergies    Anemia    Arthritis    Asthma    Back pain    Chronic pain    Diabetes (HCC)    Diabetes mellitus without complication (HCC)    Edema, lower extremity    Fibroid    Fibromyalgia    Chronic   GERD (gastroesophageal reflux disease)    Headache    migraines   Hepatic steatosis    High blood pressure    History of hiatal hernia    History of kidney stones    History of stomach ulcers    IBS (irritable bowel syndrome)    Joint pain    Sleep apnea    did use cpap-lost 25lb-says she does not need it   NO CPAP   Wears  contact lenses     MEDICATIONS: Current Outpatient Medications on File Prior to Visit  Medication Sig Dispense Refill   ABRYSVO 120 MCG/0.5ML injection      acetaminophen  (TYLENOL ) 325 MG tablet Take 1-2 tablets (325-650 mg total) by mouth every 4 (four) hours as needed for mild pain.     albuterol  (PROVENTIL ) (2.5 MG/3ML) 0.083% nebulizer solution Take 2.5 mg by nebulization every 6 (six) hours as needed for wheezing or shortness of breath.     albuterol  (VENTOLIN  HFA) 108 (90 Base) MCG/ACT inhaler Inhale 2 puffs into the lungs every 4 (four) hours as needed.     amLODipine  (NORVASC ) 10 MG tablet Take 10 mg by mouth daily.     atorvastatin  (LIPITOR) 10 MG tablet Take 1 tablet (10 mg total) by mouth at  bedtime. 30 tablet 0   azelastine  (ASTELIN ) 0.1 % nasal spray Place 1 spray into both nostrils 2 (two) times daily as needed for rhinitis. 30 mL 0   B-D 3CC LUER-LOK SYR 25GX1 25G X 1 3 ML MISC SMARTSIG:1 IM Every 2 Weeks     BISACODYL  5 MG EC tablet Take 10 mg by mouth 2 (two) times daily.     botulinum toxin Type A  (BOTOX ) 200 units injection Inject 155 units IM into multiple site in the face,neck and head once every 90 days 1 each 4   BREO ELLIPTA 200-25 MCG/ACT AEPB Inhale 1 puff into the lungs daily.     COMIRNATY syringe      cyanocobalamin  (VITAMIN B12) 1000 MCG/ML injection Inject into the muscle.     cyclobenzaprine (FLEXERIL) 10 MG tablet Take 20 mg by mouth at bedtime.     desloratadine (CLARINEX) 5 MG tablet Take 5 mg by mouth daily.     diclofenac (VOLTAREN) 75 MG EC tablet Take 75 mg by mouth 2 (two) times daily.     eletriptan  (RELPAX ) 40 MG tablet SMARTSIG:1 Tablet(s) By Mouth 1-2 Times Daily     EPINEPHrine  0.3 mg/0.3 mL IJ SOAJ injection Inject 0.3 mg into the muscle as needed for anaphylaxis.     fluticasone (FLONASE) 50 MCG/ACT nasal spray Place 1 spray into both nostrils in the morning and at bedtime.     fluticasone-salmeterol (ADVAIR) 250-50 MCG/ACT AEPB Inhale 1 puff into the lungs 2 (two) times daily.     furosemide (LASIX) 20 MG tablet Take 20 mg by mouth daily as needed for fluid.     gabapentin  (NEURONTIN ) 300 MG capsule Take 300 mg by mouth 2 (two) times daily.     HYDROcodone-acetaminophen  (NORCO/VICODIN) 5-325 MG tablet Take 3 tablets by mouth at bedtime.     levocetirizine (XYZAL ) 5 MG tablet Take 1 tablet (5 mg total) by mouth every evening. 30 tablet 0   lidocaine -prilocaine  (EMLA ) cream Apply 1 Application topically as needed. 30 g 0   LINZESS  145 MCG CAPS capsule Take 145 mcg by mouth every morning.     meclizine  (ANTIVERT ) 25 MG tablet Take 1 tablet (25 mg total) by mouth 3 (three) times daily as needed for dizziness. 60 tablet 0   melatonin 5 MG TABS Take  1 tablet (5 mg total) by mouth at bedtime. 30 tablet 0   methocarbamol  (ROBAXIN ) 500 MG tablet Take 750 mg by mouth every 8 (eight) hours as needed for muscle spasms.     montelukast  (SINGULAIR ) 10 MG tablet Take 1 tablet (10 mg total) by mouth at bedtime. 30 tablet 0   naratriptan  (AMERGE) 2.5 MG tablet Take  1 tablet (2.5 mg total) by mouth as needed for migraine. Take one (1) tablet at onset of headache; if returns or does not resolve, may repeat after 4 hours; do not exceed five (5) mg in 24 hours. 10 tablet 5   NON FORMULARY Pt uses a cpap nightly     ondansetron  (ZOFRAN ) 4 MG tablet Take 1 tablet (4 mg total) by mouth every 8 (eight) hours as needed for nausea or vomiting. 20 tablet 5   ondansetron  (ZOFRAN -ODT) 4 MG disintegrating tablet TAKE ONE TABLET BY MOUTH every EIGHT hours AS NEEDED FOR NAUSEA/VOMITING 90 tablet 2   ondansetron  (ZOFRAN -ODT) 8 MG disintegrating tablet Take 8 mg by mouth every 8 (eight) hours as needed.     Oxcarbazepine  (TRILEPTAL ) 300 MG tablet Take 3 tablets (900 mg total) by mouth at bedtime. 270 tablet 1   oxybutynin  (DITROPAN ) 5 MG tablet Take 1 tablet (5 mg total) by mouth every 8 (eight) hours as needed for bladder spasms. (Patient taking differently: Take 5 mg by mouth daily.) 30 tablet 0   pantoprazole  (PROTONIX ) 40 MG tablet Take 1 tablet (40 mg total) by mouth daily. (Patient taking differently: Take 40 mg by mouth in the morning and at bedtime.) 30 tablet 0   polyethylene glycol (MIRALAX  / GLYCOLAX ) 17 g packet Take 17 g by mouth daily as needed for mild constipation. 14 each 0   QUEtiapine  (SEROQUEL ) 300 MG tablet Take 1 tablet (300 mg total) by mouth at bedtime. 90 tablet 3   Semaglutide ,0.25 or 0.5MG /DOS, (OZEMPIC , 0.25 OR 0.5 MG/DOSE,) 2 MG/1.5ML SOPN Inject 0.5 mg into the skin once a week. (Patient taking differently: Inject 0.5 mg into the skin every Sunday.) 1.5 mL 0   senna-docusate (SENOKOT-S) 8.6-50 MG tablet Take 2 tablets by mouth at bedtime. 60  tablet 0   tamsulosin  (FLOMAX ) 0.4 MG CAPS capsule Take 1 capsule (0.4 mg total) by mouth at bedtime. 30 capsule 0   Tetrahydrozoline HCl (VISINE OP) Place 1 drop into both eyes 2 (two) times daily as needed (dry eyes).     Ubrogepant  (UBRELVY ) 100 MG TABS Take 1 tablet (100 mg total) by mouth as needed (take 1 tab at the earlist onset of a migraine. May repeat in 2 hours. Max 2 tabs in 24 hours). 16 tablet 5   Vitamin D , Ergocalciferol , (DRISDOL ) 1.25 MG (50000 UNIT) CAPS capsule TAKE ONE CAPSULE BY MOUTH EVERY 7 DAYS (Patient taking differently: Take 50,000 Units by mouth every Sunday.) 4 capsule 0   zolpidem  (AMBIEN ) 10 MG tablet Take 1 tablet (10 mg total) by mouth at bedtime. 30 tablet 5   zonisamide  (ZONEGRAN ) 100 MG capsule Take 3 capsules (300 mg total) by mouth daily. 90 capsule 5   Current Facility-Administered Medications on File Prior to Visit  Medication Dose Route Frequency Provider Last Rate Last Admin   botulinum toxin Type A  (BOTOX ) injection 200 Units  200 Units Intramuscular Once Damarious Holtsclaw R, DO       lidocaine  (XYLOCAINE ) 1 % (with pres) injection 6 mL  6 mL Other Once        lidocaine  (XYLOCAINE ) 1 % (with pres) injection 6 mL  6 mL Other Once        lidocaine  (XYLOCAINE ) 1 % (with pres) injection 6 mL  6 mL Other Once         ALLERGIES: Allergies  Allergen Reactions   Rocephin [Ceftriaxone] Anaphylaxis   Morphine And Codeine Itching and Nausea And Vomiting  Doesn't work   Prednisone Itching and Swelling   Sulfa Antibiotics Itching and Swelling   Amitriptyline Other (See Comments)   Penicillin G    Penicillin G Sodium Other (See Comments)    FAMILY HISTORY: Family History  Problem Relation Age of Onset   Obesity Mother    Diabetes Father    High blood pressure Father    Sudden death Father    Breast cancer Paternal Grandmother    Breast cancer Paternal Aunt       Objective:  Blood pressure 138/75, pulse 97, height 5' 1 (1.549 m), weight 237 lb  (107.5 kg), SpO2 96%. General: No acute distress.  Patient appears well-groomed.   Head:  Normocephalic/atraumatic Eyes:  Fundi examined but not visualized Neck: supple, no paraspinal tenderness, full range of motion Heart:  Regular rate and rhythm Lungs:  Clear to auscultation bilaterally Back: No paraspinal tenderness Neurological Exam: alert and oriented.  Speech fluent and not dysarthric, language intact.  CN II-XII intact. Bulk and tone normal, muscle strength 5/5 throughout.  Sensation to light touch intact.  Deep tendon reflexes 2+ throughout, toes downgoing.  Finger to nose testing intact.  Tremor not appreciated.  Cautious gait.  Uses walker.   Juliene Dunnings, DO  CC: Geni Shutter, DO

## 2023-08-23 ENCOUNTER — Encounter: Payer: Self-pay | Admitting: Neurology

## 2023-08-23 ENCOUNTER — Ambulatory Visit (INDEPENDENT_AMBULATORY_CARE_PROVIDER_SITE_OTHER): Payer: Medicare HMO | Admitting: Neurology

## 2023-08-23 VITALS — BP 138/75 | HR 97 | Ht 61.0 in | Wt 237.0 lb

## 2023-08-23 DIAGNOSIS — G43009 Migraine without aura, not intractable, without status migrainosus: Secondary | ICD-10-CM

## 2023-08-23 DIAGNOSIS — R55 Syncope and collapse: Secondary | ICD-10-CM | POA: Diagnosis not present

## 2023-08-23 DIAGNOSIS — D432 Neoplasm of uncertain behavior of brain, unspecified: Secondary | ICD-10-CM | POA: Diagnosis not present

## 2023-08-23 MED ORDER — UBRELVY 100 MG PO TABS: 100.0000 mg | ORAL_TABLET | ORAL | 5 refills | Status: DC | PRN

## 2023-08-23 MED ORDER — NARATRIPTAN HCL 2.5 MG PO TABS: 2.5000 mg | ORAL_TABLET | ORAL | 5 refills | Status: DC | PRN

## 2023-08-23 MED ORDER — ZONISAMIDE 100 MG PO CAPS: 300.0000 mg | ORAL_CAPSULE | Freq: Every day | ORAL | 5 refills | Status: DC

## 2023-08-23 NOTE — Patient Instructions (Signed)
 Check EEG Continue zonisamide 300mg  daily and Botox every 3 months When you get a migraine, take Ubrelvy and/or naratriptan.

## 2023-08-27 ENCOUNTER — Telehealth: Payer: Self-pay

## 2023-08-27 DIAGNOSIS — J3081 Allergic rhinitis due to animal (cat) (dog) hair and dander: Secondary | ICD-10-CM | POA: Diagnosis not present

## 2023-08-27 DIAGNOSIS — J301 Allergic rhinitis due to pollen: Secondary | ICD-10-CM | POA: Diagnosis not present

## 2023-08-27 DIAGNOSIS — J3089 Other allergic rhinitis: Secondary | ICD-10-CM | POA: Diagnosis not present

## 2023-08-27 NOTE — Telephone Encounter (Signed)
 Please send a new Rx to [facility].

## 2023-08-28 ENCOUNTER — Telehealth: Payer: Self-pay

## 2023-08-28 ENCOUNTER — Other Ambulatory Visit: Payer: Self-pay

## 2023-08-28 DIAGNOSIS — F419 Anxiety disorder, unspecified: Secondary | ICD-10-CM

## 2023-08-28 DIAGNOSIS — F5105 Insomnia due to other mental disorder: Secondary | ICD-10-CM

## 2023-08-28 MED ORDER — QUETIAPINE FUMARATE 300 MG PO TABS: 300.0000 mg | ORAL_TABLET | Freq: Every day | ORAL | 0 refills | Status: DC

## 2023-08-28 MED ORDER — OXCARBAZEPINE 300 MG PO TABS: 900.0000 mg | ORAL_TABLET | Freq: Every evening | ORAL | 1 refills | Status: DC

## 2023-08-28 NOTE — Telephone Encounter (Signed)
 PA needed for Ubrelvy.

## 2023-08-28 NOTE — Addendum Note (Signed)
 Addended by: Genice Rouge on: 08/28/2023 11:24 AM   Modules accepted: Orders

## 2023-08-29 ENCOUNTER — Other Ambulatory Visit: Payer: Self-pay | Admitting: Psychiatry

## 2023-08-29 ENCOUNTER — Other Ambulatory Visit: Payer: Self-pay | Admitting: Family Medicine

## 2023-08-29 DIAGNOSIS — Z1231 Encounter for screening mammogram for malignant neoplasm of breast: Secondary | ICD-10-CM

## 2023-08-29 DIAGNOSIS — F5105 Insomnia due to other mental disorder: Secondary | ICD-10-CM

## 2023-08-29 NOTE — Telephone Encounter (Signed)
 Pt called at 4:22p. She was using [facility] which went out of business.  She didn't know she didn't have any refills until just now.  Pls send to [facility].  Pt is out of meds.  Next appt 1/14

## 2023-08-29 NOTE — Telephone Encounter (Signed)
 Confirm medication is Zolpidem

## 2023-08-30 NOTE — Telephone Encounter (Signed)
 Pt called to see if she can get her Zolpidem today due to snow. Pharmacy closes at 4pm and can't get delivery today and friend can't drive in snow.

## 2023-09-03 ENCOUNTER — Encounter: Payer: Self-pay | Admitting: Psychiatry

## 2023-09-03 ENCOUNTER — Ambulatory Visit: Payer: Medicare HMO | Admitting: Psychiatry

## 2023-09-03 DIAGNOSIS — M797 Fibromyalgia: Secondary | ICD-10-CM

## 2023-09-03 DIAGNOSIS — J301 Allergic rhinitis due to pollen: Secondary | ICD-10-CM | POA: Diagnosis not present

## 2023-09-03 DIAGNOSIS — G4733 Obstructive sleep apnea (adult) (pediatric): Secondary | ICD-10-CM | POA: Diagnosis not present

## 2023-09-03 DIAGNOSIS — F419 Anxiety disorder, unspecified: Secondary | ICD-10-CM

## 2023-09-03 DIAGNOSIS — J3089 Other allergic rhinitis: Secondary | ICD-10-CM | POA: Diagnosis not present

## 2023-09-03 DIAGNOSIS — J3081 Allergic rhinitis due to animal (cat) (dog) hair and dander: Secondary | ICD-10-CM | POA: Diagnosis not present

## 2023-09-03 DIAGNOSIS — F5105 Insomnia due to other mental disorder: Secondary | ICD-10-CM

## 2023-09-03 DIAGNOSIS — F450 Somatization disorder: Secondary | ICD-10-CM | POA: Diagnosis not present

## 2023-09-03 MED ORDER — QUETIAPINE FUMARATE 300 MG PO TABS: 300.0000 mg | ORAL_TABLET | Freq: Every day | ORAL | 0 refills | Status: DC

## 2023-09-03 MED ORDER — ZOLPIDEM TARTRATE 10 MG PO TABS: 10.0000 mg | ORAL_TABLET | Freq: Every day | ORAL | 5 refills | Status: DC

## 2023-09-03 NOTE — Progress Notes (Signed)
 Virginia Luna [id number] [birth year] 63 y.o.   Subjective:   Patient ID:  Virginia Luna is a 63 y.o. (DOB 05/17/1961) female.  Chief Complaint:  Chief Complaint  Patient presents with   Follow-up   Sleeping Problem   Fatigue    HPI Virginia Luna presents for follow-up of chronic insomnia.  seen May 2020 & March 2021.  No meds were changed  07/20/20 appt with following noted: No Covid. Vaccinated.  Pain in back with injections and problems with migraine.  Maintaining OK.  Isolating and wearing mask.  Outside church when possible.  Not depressed.    Sleep affected by pain off and on. Sleep is OK unless pain interferes.  Some good sleep is better than none.  If can not stay asleep then will get up and work a puzzle and then go back to sleep.    Awakens in pain from FM and related to fall. Trying to keep up with exercises.   Pain is still high more often than it should be.  Seeing Dr. Prentiss about weight loss.  No new meds.  Just finished PT from the last sig fall.    08/07/2021 appt noted: Brain tumor  have blacked out and had tremors.  Referred to in My Chart as a nodule.  Has had some falls and been worked up without dx determinative.  Still seeing Dr. Prentiss regularly and that is helpful.  Has been tearful but not depressed.   Remains on quetiapine  300 mg HS and Ambien  10 mg HS.  They help sleep and needs the combination to sleep. Plan: no changes  09/05/22 appt noted: Continues quetiapine  300 mg HS and Ambien  10 mg HS. Chronic issues with memory.  Seeing neuro for migraine shots, Dr. Skeet.  Cerebellar tumor resection March 2023.  Motor skills not as good and needs walker.  Has had some falls and in PT.  Was crying a lot of difference in her from surgery but not as much now.  Not really good with change and doesn't want med changes. Made some notes for today.   Lost some weight with Dr. Prentiss.    Using CPAP usally but sometimes throws it off.  Meds she takes helps her  sleep.   Sleep routine is same with some awakeing and gets out of bed if can't sleep and watch TV and then back to sleep.  09/03/23 appt note:  seen in office Psych med as noted: plus oxcarbazepine  900 HS for pain She is having deviated septum surgery soon.   Been through a lot but I'm here. Lost some wt significantly.   Ozempic  helped.   Sleep ok with meds unless in sig pain.   No SE.   Not markedly depressed.  Sad over friend dying.   Still praying for others, going to church. Mild tremor, drops things, some falling.   Seeing neuro.  Pending EEG. Ongoing FM sx.    Wears mask pretty much all the time even at home.   Past Psychiatric Medication Trials: Doxepin, Rozerem,  Amitriptyline, mirtazapine, Ambien , quetiapine  olanzapine Lyrica , Trileptal , duloxetine gabapentin , cyclobenzaprine  Review of Systems:  Review of Systems  HENT:  Negative for postnasal drip, rhinorrhea and sneezing.   Musculoskeletal:  Positive for back pain, myalgias and neck pain.  Neurological:  Positive for weakness. Negative for tremors.       Frequent falls  Psychiatric/Behavioral:  Positive for sleep disturbance. Negative for dysphoric mood.  Memory   Medications: I have reviewed the patient's current medications.  Current Outpatient Medications  Medication Sig Dispense Refill   ABRYSVO 120 MCG/0.5ML injection      acetaminophen  (TYLENOL ) 325 MG tablet Take 1-2 tablets (325-650 mg total) by mouth every 4 (four) hours as needed for mild pain.     albuterol  (PROVENTIL ) (2.5 MG/3ML) 0.083% nebulizer solution Take 2.5 mg by nebulization every 6 (six) hours as needed for wheezing or shortness of breath.     albuterol  (VENTOLIN  HFA) 108 (90 Base) MCG/ACT inhaler Inhale 2 puffs into the lungs every 4 (four) hours as needed.     amLODipine  (NORVASC ) 10 MG tablet Take 10 mg by mouth daily.     atorvastatin  (LIPITOR) 10 MG tablet Take 1 tablet (10 mg total) by mouth at bedtime. 30 tablet 0   azelastine   (ASTELIN ) 0.1 % nasal spray Place 1 spray into both nostrils 2 (two) times daily as needed for rhinitis. 30 mL 0   B-D 3CC LUER-LOK SYR 25GX1 25G X 1 3 ML MISC SMARTSIG:1 IM Every 2 Weeks     BISACODYL  5 MG EC tablet Take 10 mg by mouth 2 (two) times daily.     botulinum toxin Type A  (BOTOX ) 200 units injection Inject 155 units IM into multiple site in the face,neck and head once every 90 days 1 each 4   BREO ELLIPTA 200-25 MCG/ACT AEPB Inhale 1 puff into the lungs daily.     COMIRNATY syringe      cyanocobalamin  (VITAMIN B12) 1000 MCG/ML injection Inject into the muscle.     cyclobenzaprine (FLEXERIL) 10 MG tablet Take 20 mg by mouth at bedtime.     desloratadine (CLARINEX) 5 MG tablet Take 5 mg by mouth daily.     diclofenac (VOLTAREN) 75 MG EC tablet Take 75 mg by mouth 2 (two) times daily.     eletriptan  (RELPAX ) 40 MG tablet SMARTSIG:1 Tablet(s) By Mouth 1-2 Times Daily     EPINEPHrine  0.3 mg/0.3 mL IJ SOAJ injection Inject 0.3 mg into the muscle as needed for anaphylaxis.     fluticasone (FLONASE) 50 MCG/ACT nasal spray Place 1 spray into both nostrils in the morning and at bedtime.     fluticasone-salmeterol (ADVAIR) 250-50 MCG/ACT AEPB Inhale 1 puff into the lungs 2 (two) times daily.     furosemide (LASIX) 20 MG tablet Take 20 mg by mouth daily as needed for fluid.     gabapentin  (NEURONTIN ) 300 MG capsule Take 300 mg by mouth 2 (two) times daily.     HYDROcodone-acetaminophen  (NORCO/VICODIN) 5-325 MG tablet Take 3 tablets by mouth at bedtime.     levocetirizine (XYZAL ) 5 MG tablet Take 1 tablet (5 mg total) by mouth every evening. 30 tablet 0   lidocaine -prilocaine  (EMLA ) cream Apply 1 Application topically as needed. 30 g 0   LINZESS  145 MCG CAPS capsule Take 145 mcg by mouth every morning.     meclizine  (ANTIVERT ) 25 MG tablet Take 1 tablet (25 mg total) by mouth 3 (three) times daily as needed for dizziness. 60 tablet 0   melatonin 5 MG TABS Take 1 tablet (5 mg total) by mouth at  bedtime. 30 tablet 0   methocarbamol  (ROBAXIN ) 500 MG tablet Take 750 mg by mouth every 8 (eight) hours as needed for muscle spasms.     montelukast  (SINGULAIR ) 10 MG tablet Take 1 tablet (10 mg total) by mouth at bedtime. 30 tablet 0   naratriptan  (AMERGE) 2.5 MG tablet Take  1 tablet (2.5 mg total) by mouth as needed for migraine. Take one (1) tablet at onset of headache; if returns or does not resolve, may repeat after 4 hours; do not exceed five (5) mg in 24 hours. 10 tablet 5   NON FORMULARY Pt uses a cpap nightly     ondansetron  (ZOFRAN ) 4 MG tablet Take 1 tablet (4 mg total) by mouth every 8 (eight) hours as needed for nausea or vomiting. 20 tablet 5   ondansetron  (ZOFRAN -ODT) 4 MG disintegrating tablet TAKE ONE TABLET BY MOUTH every EIGHT hours AS NEEDED FOR NAUSEA/VOMITING 90 tablet 2   ondansetron  (ZOFRAN -ODT) 8 MG disintegrating tablet Take 8 mg by mouth every 8 (eight) hours as needed.     Oxcarbazepine  (TRILEPTAL ) 300 MG tablet Take 3 tablets (900 mg total) by mouth at bedtime. 270 tablet 1   oxybutynin  (DITROPAN ) 5 MG tablet Take 1 tablet (5 mg total) by mouth every 8 (eight) hours as needed for bladder spasms. (Patient taking differently: Take 5 mg by mouth daily.) 30 tablet 0   pantoprazole  (PROTONIX ) 40 MG tablet Take 1 tablet (40 mg total) by mouth daily. (Patient taking differently: Take 40 mg by mouth in the morning and at bedtime.) 30 tablet 0   polyethylene glycol (MIRALAX  / GLYCOLAX ) 17 g packet Take 17 g by mouth daily as needed for mild constipation. 14 each 0   Semaglutide ,0.25 or 0.5MG /DOS, (OZEMPIC , 0.25 OR 0.5 MG/DOSE,) 2 MG/1.5ML SOPN Inject 0.5 mg into the skin once a week. (Patient taking differently: Inject 0.5 mg into the skin every Sunday.) 1.5 mL 0   senna-docusate (SENOKOT-S) 8.6-50 MG tablet Take 2 tablets by mouth at bedtime. 60 tablet 0   tamsulosin  (FLOMAX ) 0.4 MG CAPS capsule Take 1 capsule (0.4 mg total) by mouth at bedtime. 30 capsule 0   Tetrahydrozoline HCl  (VISINE OP) Place 1 drop into both eyes 2 (two) times daily as needed (dry eyes).     Ubrogepant  (UBRELVY ) 100 MG TABS Take 1 tablet (100 mg total) by mouth as needed (take 1 tab at the earlist onset of a migraine. May repeat in 2 hours. Max 2 tabs in 24 hours). 16 tablet 5   Vitamin D , Ergocalciferol , (DRISDOL ) 1.25 MG (50000 UNIT) CAPS capsule TAKE ONE CAPSULE BY MOUTH EVERY 7 DAYS (Patient taking differently: Take 50,000 Units by mouth every Sunday.) 4 capsule 0   zonisamide  (ZONEGRAN ) 100 MG capsule Take 3 capsules (300 mg total) by mouth daily. 90 capsule 5   QUEtiapine  (SEROQUEL ) 300 MG tablet Take 1 tablet (300 mg total) by mouth at bedtime. 90 tablet 0   zolpidem  (AMBIEN ) 10 MG tablet Take 1 tablet (10 mg total) by mouth at bedtime. 30 tablet 5   Current Facility-Administered Medications  Medication Dose Route Frequency Provider Last Rate Last Admin   botulinum toxin Type A  (BOTOX ) injection 200 Units  200 Units Intramuscular Once Jaffe, Adam R, DO       lidocaine  (XYLOCAINE ) 1 % (with pres) injection 6 mL  6 mL Other Once        lidocaine  (XYLOCAINE ) 1 % (with pres) injection 6 mL  6 mL Other Once        lidocaine  (XYLOCAINE ) 1 % (with pres) injection 6 mL  6 mL Other Once         Medication Side Effects: None  Allergies:  Allergies  Allergen Reactions   Rocephin [Ceftriaxone] Anaphylaxis   Morphine And Codeine Itching and Nausea And Vomiting  Doesn't work   Prednisone Itching and Swelling   Sulfa Antibiotics Itching and Swelling   Amitriptyline Other (See Comments)   Penicillin G    Penicillin G Sodium Other (See Comments)    Past Medical History:  Diagnosis Date   Allergies    Anemia    Arthritis    Asthma    Back pain    Chronic pain    Diabetes (HCC)    Diabetes mellitus without complication (HCC)    Edema, lower extremity    Fibroid    Fibromyalgia    Chronic   GERD (gastroesophageal reflux disease)    Headache    migraines   Hepatic steatosis    High  blood pressure    History of hiatal hernia    History of kidney stones    History of stomach ulcers    IBS (irritable bowel syndrome)    Joint pain    Sleep apnea    did use cpap-lost 25lb-says she does not need it   NO CPAP   Wears contact lenses     Family History  Problem Relation Age of Onset   Obesity Mother    Diabetes Father    High blood pressure Father    Sudden death Father    Breast cancer Paternal Grandmother    Breast cancer Paternal Aunt     Social History   Socioeconomic History   Marital status: Single    Spouse name: Not on file   Number of children: Not on file   Years of education: Not on file   Highest education level: Some college, no degree  Occupational History   Occupation: disabled    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 2 sodas a week   Social Drivers of Corporate Investment Banker Strain: Not on file  Food Insecurity: Low Risk  (05/07/2023)   Received from [facility]   Hunger Vital Sign    Worried About Running Out of Food in the Last Year: Never true    Ran Out of Food in the Last Year: Never true  Transportation Needs: Unmet Transportation Needs (05/07/2023)   Received from [facility]    In the past 12 months, has lack of reliable transportation kept you from medical appointments, meetings, work or from getting things needed for daily living? : Yes  Physical Activity: Not on file  Stress: Not on file  Social  Connections: Not on file  Intimate Partner Violence: Not on file    Past Medical History, Surgical history, Social history, and Family history were reviewed and updated as appropriate.   Please see review of systems for further details on the patient's review from today.   Objective:   Physical Exam:  LMP  (LMP Unknown)   Physical Exam Neurological:     Mental Status: She is alert and  oriented to person, place, and time.     Cranial Nerves: No dysarthria.  Psychiatric:        Attention and Perception: Attention normal. She does not perceive auditory hallucinations.        Mood and Affect: Mood is anxious. Mood is not depressed. Affect is not tearful.        Speech: Speech normal.        Behavior: Behavior is cooperative.        Thought Content: Thought content normal. Thought content is not paranoid or delusional. Thought content does not include homicidal or suicidal ideation. Thought content does not include suicidal plan.        Cognition and Memory: Cognition normal. She exhibits impaired recent memory.        Judgment: Judgment normal.     Comments: Chronic somatic focus no worse.   Insight fair. Talkative per usual.     Lab Review:     Component Value Date/Time   NA 144 09/28/2022 1111   NA 143 09/05/2021 1523   K 3.3 (L) 09/28/2022 1111   CL 108 09/28/2022 1111   CO2 26 09/28/2022 1111   GLUCOSE 103 (H) 09/28/2022 1111   BUN 16 09/28/2022 1111   BUN 15 09/05/2021 1523   CREATININE 0.50 09/28/2022 1111   CALCIUM  9.2 09/28/2022 1111   PROT 7.9 09/28/2022 1111   PROT 7.3 09/05/2021 1523   ALBUMIN  4.5 09/28/2022 1111   ALBUMIN  4.6 09/05/2021 1523   AST 17 09/28/2022 1111   ALT 30 09/28/2022 1111   ALKPHOS 145 (H) 09/28/2022 1111   BILITOT 0.5 09/28/2022 1111   BILITOT 0.2 09/05/2021 1523   GFRNONAA >60 09/28/2022 1111   GFRAA 116 10/12/2020 1624       Component Value Date/Time   WBC 7.3 09/28/2022 1111   RBC 4.22 09/28/2022 1111   HGB 11.2 (L)  09/28/2022 1111   HGB 11.6 09/05/2021 1523   HCT 37.3 09/28/2022 1111   HCT 36.0 09/05/2021 1523   PLT 408 (H) 09/28/2022 1111   PLT 501 (H) 09/05/2021 1523   MCV 88.4 09/28/2022 1111   MCV 84 09/05/2021 1523   MCH 26.5 09/28/2022 1111   MCHC 30.0 09/28/2022 1111   RDW 17.5 (H) 09/28/2022 1111   RDW 16.0 (H) 09/05/2021 1523   LYMPHSABS 2.3 11/24/2021 0511   LYMPHSABS 2.2 09/05/2021 1523   MONOABS 0.6 11/24/2021 0511   EOSABS 0.2 11/24/2021 0511   EOSABS 0.1 09/05/2021 1523   BASOSABS 0.1 11/24/2021 0511   BASOSABS 0.0 09/05/2021 1523    No results found for: POCLITH, LITHIUM   No results found for: PHENYTOIN, PHENOBARB, VALPROATE, CBMZ   .res Assessment: Plan:    OSA, unable to tolerate CPAP (vomited every night)  Anxiety with somatization - Plan: QUEtiapine  (SEROQUEL ) 300 MG tablet  Insomnia due to mental condition - Plan: QUEtiapine  (SEROQUEL ) 300 MG tablet, zolpidem  (AMBIEN ) 10 MG tablet  Fibromyalgia    30 min face to face time with patient was spent on counseling and coordination of care. We discussed Chronic somatization and anxiety and insomnia ongoing but helped by meds markedly..  Now worsening MCI bc cerebellar tumor resection.  Discussed the possibility of splitting the zolpidem  between sleep onset and then with the early morning awakening.  She says she is tried that and does better about taking it all at once first thing going to bed.  We discussed the risk of amnesia.  She is not had any problems with that.  Discussed potential metabolic side effects associated with atypical antipsychotics, as well as potential risk for movement side effects. Advised pt to contact office if movement side effects occur.  She has required quetiapine  for chronic severe insomnia that is related to both anxiety and to fibromyalgia.  She gets clear benefit from quetiapine .  She is at the lowest effective dose.  We have tried to reduce dosages.  She is tolerating it well.   Continue quetiapine  300 mg HS and Ambien  10 mg HS.  Cannot sleep without these meds.  Quetiapine  likely having positive mood and anxiety affect also.  Med dosing has been stable for follow-up of several years Improved sleep and moods with the meds.  No med changes  Follow-up 12 months because of stability.  She has been on the same meds at the same dosage for several years.  Schedule next appt in office if can get here.  DT mobility problems couldn't get her today DT recent falling.  Lorene Macintosh, MD, DFAPA   Please see After Visit Summary for patient specific instructions.  Future Appointments  Date Time Provider Department Center  09/30/2023 11:40 AM Cornelio Bouchard, MD [facility] CPR  10/21/2023  9:30 AM [facility] EEG TECH [facility] None  11/08/2023  7:50 AM Skeet Cornet R, DO [facility] None  11/11/2023 11:20 AM Cornelio Bouchard, MD [facility] CPR  12/23/2023  1:00 PM Lovorn, Bouchard, MD [facility] CPR  01/07/2024  5:10 PM [facility] MM 3 [facility] CE  01/27/2024  1:00 PM Lomax, Amy, NP [facility] None  02/24/2024  1:50 PM Skeet, Adam R, DO [facility] None  03/17/2024  8:00 AM Chrzanowski, Jami B, NP [facility] None    No orders of the defined types were placed in this encounter.     -------------------------------

## 2023-09-06 ENCOUNTER — Other Ambulatory Visit: Payer: Self-pay

## 2023-09-06 ENCOUNTER — Telehealth: Payer: Self-pay | Admitting: Pharmacy Technician

## 2023-09-06 ENCOUNTER — Other Ambulatory Visit (HOSPITAL_COMMUNITY): Payer: Self-pay

## 2023-09-06 MED ORDER — ELETRIPTAN HYDROBROMIDE 40 MG PO TABS: 40.0000 mg | ORAL_TABLET | Freq: Once | ORAL | 2 refills | Status: DC

## 2023-09-06 MED ORDER — NARATRIPTAN HCL 2.5 MG PO TABS: 2.5000 mg | ORAL_TABLET | ORAL | 5 refills | Status: DC | PRN

## 2023-09-06 MED ORDER — ZONISAMIDE 100 MG PO CAPS: 300.0000 mg | ORAL_CAPSULE | Freq: Every day | ORAL | 5 refills | Status: DC

## 2023-09-06 NOTE — Telephone Encounter (Signed)
PA request has been Approved. New Encounter created for follow up. For additional info see Pharmacy Prior Auth telephone encounter from 09/06/2023.

## 2023-09-06 NOTE — Telephone Encounter (Signed)
Pharmacy Patient Advocate Encounter  Received notification from [facility] that Prior Authorization for Ubrelvy 100MG  tablets has been APPROVED from 09/06/2023 to 08/18/2024   PA #/Case ID/Reference #: [id number] Key: Virginia Luna

## 2023-09-09 ENCOUNTER — Ambulatory Visit: Payer: Medicare HMO | Admitting: Psychiatry

## 2023-09-10 DIAGNOSIS — J3081 Allergic rhinitis due to animal (cat) (dog) hair and dander: Secondary | ICD-10-CM | POA: Diagnosis not present

## 2023-09-10 DIAGNOSIS — J3089 Other allergic rhinitis: Secondary | ICD-10-CM | POA: Diagnosis not present

## 2023-09-10 DIAGNOSIS — J301 Allergic rhinitis due to pollen: Secondary | ICD-10-CM | POA: Diagnosis not present

## 2023-09-11 DIAGNOSIS — Z86018 Personal history of other benign neoplasm: Secondary | ICD-10-CM | POA: Diagnosis not present

## 2023-09-11 DIAGNOSIS — G894 Chronic pain syndrome: Secondary | ICD-10-CM | POA: Diagnosis not present

## 2023-09-11 DIAGNOSIS — F5101 Primary insomnia: Secondary | ICD-10-CM | POA: Diagnosis not present

## 2023-09-11 DIAGNOSIS — I10 Essential (primary) hypertension: Secondary | ICD-10-CM | POA: Diagnosis not present

## 2023-09-11 DIAGNOSIS — J4520 Mild intermittent asthma, uncomplicated: Secondary | ICD-10-CM | POA: Diagnosis not present

## 2023-09-11 DIAGNOSIS — G43909 Migraine, unspecified, not intractable, without status migrainosus: Secondary | ICD-10-CM | POA: Diagnosis not present

## 2023-09-11 DIAGNOSIS — M797 Fibromyalgia: Secondary | ICD-10-CM | POA: Diagnosis not present

## 2023-09-11 DIAGNOSIS — E7800 Pure hypercholesterolemia, unspecified: Secondary | ICD-10-CM | POA: Diagnosis not present

## 2023-09-11 DIAGNOSIS — E119 Type 2 diabetes mellitus without complications: Secondary | ICD-10-CM | POA: Diagnosis not present

## 2023-09-12 ENCOUNTER — Other Ambulatory Visit: Payer: Self-pay

## 2023-09-12 ENCOUNTER — Encounter (HOSPITAL_COMMUNITY): Payer: Self-pay | Admitting: Otolaryngology

## 2023-09-12 NOTE — Progress Notes (Signed)
PCP - Helane Rima, DO  Neurology Drema Dallas, DO (Botox ) Cardiologist -    PPM/ICD - denies Device Orders - n/a Rep Notified - n/a  Chest x-ray - denies EKG - denies Stress Test - denies ECHO - denies Cardiac Cath - denies  CPAP - Nightly   GLP-1 -Semaglutide, OZEMPIC, last dose 09-01-23  DM MD monitors blood work  Blood Thinner Instructions: denies Aspirin Instructions: n/a  ERAS Protcol - Clear liquids until 11:15  COVID TEST- n'a  Anesthesia review: no  Patient verbally denies any shortness of breath, fever, cough and chest pain during phone call   -------------  SDW INSTRUCTIONS given:  Your procedure is scheduled on September 16, 2023.  Report to [facility] Main Entrance "A" at 11:45 A.M., and check in at the Admitting office.  Call this number if you have problems the morning of surgery:  [phone]   Remember:  Do not eat after midnight the night before your surgery  You may drink clear liquids until 11:15 the morning of your surgery.   Clear liquids allowed are: Water, Non-Citrus Juices (without pulp), Carbonated Beverages, Clear Tea, Black Coffee Only, and Gatorade    Take these medicines the morning of surgery with A SIP OF WATER  amLODipine (NORVASC)  azelastine (ASTELIN)  desloratadine (CLARINEX)  fluticasone-salmeterol (ADVAIR)  gabapentin (NEURONTIN)  methocarbamol (ROBAXIN)  zonisamide (ZONEGRAN)     IF NEEDED acetaminophen (TYLENOL)  albuterol (PROVENTIL)  nebulizer solution  albuterol (VENTOLIN HFA) inhaler  EPINEPHrine  injection  fluticasone (FLONASE)  meclizine (ANTIVERT)  ondansetron (ZOFRAN-ODT)   As of today, STOP taking any Aspirin (unless otherwise instructed by your surgeon) Aleve, Naproxen, Ibuprofen, Motrin, Advil, Goody's, BC's, all herbal medications, fish oil, and all vitamins.                      Do not wear jewelry, make up, or nail polish            Do not wear lotions, powders, perfumes/colognes, or  deodorant.            Do not shave 48 hours prior to surgery.  Men may shave face and neck.            Do not bring valuables to the hospital.            [facility] is not responsible for any belongings or valuables.  Do NOT Smoke (Tobacco/Vaping) 24 hours prior to your procedure If you use a CPAP at night, you may bring all equipment for your overnight stay.   Contacts, glasses, dentures or bridgework may not be worn into surgery.      For patients admitted to the hospital, discharge time will be determined by your treatment team.   Patients discharged the day of surgery will not be allowed to drive home, and someone needs to stay with them for 24 hours.    Special instructions:   [facility]- Preparing For Surgery  Before surgery, you can play an important role. Because skin is not sterile, your skin needs to be as free of germs as possible. You can reduce the number of germs on your skin by washing with CHG (chlorahexidine gluconate) Soap before surgery.  CHG is an antiseptic cleaner which kills germs and bonds with the skin to continue killing germs even after washing.    Oral Hygiene is also important to reduce your risk of infection.  Remember - BRUSH YOUR TEETH THE MORNING OF SURGERY  WITH YOUR REGULAR TOOTHPASTE  Please do not use if you have an allergy to CHG or antibacterial soaps. If your skin becomes reddened/irritated stop using the CHG.  Do not shave (including legs and underarms) for at least 48 hours prior to first CHG shower. It is OK to shave your face.  Please follow these instructions carefully.   Shower the NIGHT BEFORE SURGERY and the MORNING OF SURGERY with DIAL Soap.   Pat yourself dry with a CLEAN TOWEL.  Wear CLEAN PAJAMAS to bed the night before surgery  Place CLEAN SHEETS on your bed the night of your first shower and DO NOT SLEEP WITH PETS.   Day of Surgery: Please shower morning of surgery  Wear Clean/Comfortable clothing the morning of surgery Do  not apply any deodorants/lotions.   Remember to brush your teeth WITH YOUR REGULAR TOOTHPASTE.   Questions were answered. Patient verbalized understanding of instructions.

## 2023-09-16 ENCOUNTER — Other Ambulatory Visit: Payer: Self-pay

## 2023-09-16 ENCOUNTER — Ambulatory Visit (HOSPITAL_BASED_OUTPATIENT_CLINIC_OR_DEPARTMENT_OTHER): Payer: Self-pay

## 2023-09-16 ENCOUNTER — Ambulatory Visit (HOSPITAL_COMMUNITY)
Admission: RE | Admit: 2023-09-16 | Discharge: 2023-09-16 | Disposition: A | Discharge status: Home / Self Care | Payer: Medicare HMO | Attending: Otolaryngology | Admitting: Otolaryngology

## 2023-09-16 ENCOUNTER — Ambulatory Visit (HOSPITAL_COMMUNITY): Payer: Self-pay

## 2023-09-16 ENCOUNTER — Encounter (HOSPITAL_COMMUNITY): Payer: Self-pay | Admitting: Otolaryngology

## 2023-09-16 ENCOUNTER — Encounter (HOSPITAL_COMMUNITY)
Admission: RE | Disposition: A | Discharge status: Home / Self Care | Payer: Self-pay | Source: Home / Self Care | Attending: Otolaryngology

## 2023-09-16 DIAGNOSIS — J342 Deviated nasal septum: Secondary | ICD-10-CM

## 2023-09-16 DIAGNOSIS — Z7985 Long-term (current) use of injectable non-insulin antidiabetic drugs: Secondary | ICD-10-CM | POA: Insufficient documentation

## 2023-09-16 DIAGNOSIS — I10 Essential (primary) hypertension: Secondary | ICD-10-CM

## 2023-09-16 DIAGNOSIS — J3489 Other specified disorders of nose and nasal sinuses: Secondary | ICD-10-CM | POA: Diagnosis not present

## 2023-09-16 DIAGNOSIS — Z79891 Long term (current) use of opiate analgesic: Secondary | ICD-10-CM | POA: Diagnosis not present

## 2023-09-16 DIAGNOSIS — J4540 Moderate persistent asthma, uncomplicated: Secondary | ICD-10-CM | POA: Diagnosis not present

## 2023-09-16 DIAGNOSIS — E119 Type 2 diabetes mellitus without complications: Secondary | ICD-10-CM | POA: Insufficient documentation

## 2023-09-16 DIAGNOSIS — K219 Gastro-esophageal reflux disease without esophagitis: Secondary | ICD-10-CM | POA: Diagnosis not present

## 2023-09-16 DIAGNOSIS — J343 Hypertrophy of nasal turbinates: Secondary | ICD-10-CM | POA: Diagnosis not present

## 2023-09-16 DIAGNOSIS — M1990 Unspecified osteoarthritis, unspecified site: Secondary | ICD-10-CM | POA: Diagnosis not present

## 2023-09-16 DIAGNOSIS — K449 Diaphragmatic hernia without obstruction or gangrene: Secondary | ICD-10-CM | POA: Diagnosis not present

## 2023-09-16 DIAGNOSIS — M797 Fibromyalgia: Secondary | ICD-10-CM | POA: Insufficient documentation

## 2023-09-16 DIAGNOSIS — Z7984 Long term (current) use of oral hypoglycemic drugs: Secondary | ICD-10-CM | POA: Insufficient documentation

## 2023-09-16 DIAGNOSIS — G4733 Obstructive sleep apnea (adult) (pediatric): Secondary | ICD-10-CM | POA: Diagnosis not present

## 2023-09-16 DIAGNOSIS — Z9889 Other specified postprocedural states: Secondary | ICD-10-CM

## 2023-09-16 HISTORY — PX: NASAL SEPTOPLASTY W/ TURBINOPLASTY: SHX2070

## 2023-09-16 LAB — CBC
HCT: 36.6 % (ref 36.0–46.0)
Hemoglobin: 11.5 g/dL — ABNORMAL LOW (ref 12.0–15.0)
MCH: 28.4 pg (ref 26.0–34.0)
MCHC: 31.4 g/dL (ref 30.0–36.0)
MCV: 90.4 fL (ref 80.0–100.0)
Platelets: 426 10*3/uL — ABNORMAL HIGH (ref 150–400)
RBC: 4.05 MIL/uL (ref 3.87–5.11)
RDW: 15.9 % — ABNORMAL HIGH (ref 11.5–15.5)
WBC: 8.9 10*3/uL (ref 4.0–10.5)
nRBC: 0 % (ref 0.0–0.2)

## 2023-09-16 LAB — GLUCOSE, CAPILLARY
Glucose-Capillary: 88 mg/dL (ref 70–99)
Glucose-Capillary: 80 mg/dL (ref 70–99)
Glucose-Capillary: 180 mg/dL — ABNORMAL HIGH (ref 70–99)

## 2023-09-16 LAB — BASIC METABOLIC PANEL
Anion gap: 12 (ref 5–15)
BUN: 14 mg/dL (ref 8–23)
CO2: 23 mmol/L (ref 22–32)
Calcium: 9.5 mg/dL (ref 8.9–10.3)
Chloride: 106 mmol/L (ref 98–111)
Creatinine, Ser: 0.62 mg/dL (ref 0.44–1.00)
GFR, Estimated: >60 mL/min (ref >60)
Glucose, Bld: 98 mg/dL (ref 70–99)
Potassium: 3.4 mmol/L — ABNORMAL LOW (ref 3.5–5.1)
Sodium: 141 mmol/L (ref 135–145)

## 2023-09-16 SURGERY — SEPTOPLASTY, NOSE, WITH NASAL TURBINATE REDUCTION
Anesthesia: General | Site: Nose | Laterality: Bilateral

## 2023-09-16 MED ORDER — PROPOFOL 10 MG/ML IV BOLUS
INTRAVENOUS | Status: AC
  Filled 2023-09-16: qty 20

## 2023-09-16 MED ORDER — LIDOCAINE-EPINEPHRINE 1 %-1:100000 IJ SOLN
INTRAMUSCULAR | Status: DC | PRN
  Administered 2023-09-16: 6 mL

## 2023-09-16 MED ORDER — HYDROMORPHONE HCL 1 MG/ML IJ SOLN
INTRAMUSCULAR | Status: AC
  Filled 2023-09-16: qty 1

## 2023-09-16 MED ORDER — HYDROMORPHONE HCL 1 MG/ML IJ SOLN
INTRAMUSCULAR | Status: AC
  Filled 2023-09-16: qty 0.5

## 2023-09-16 MED ORDER — SUGAMMADEX SODIUM 200 MG/2ML IV SOLN
INTRAVENOUS | Status: DC | PRN
  Administered 2023-09-16: 200 mg via INTRAVENOUS

## 2023-09-16 MED ORDER — SODIUM CHLORIDE 0.9 % IR SOLN
Status: DC | PRN
  Administered 2023-09-16: 500 mL

## 2023-09-16 MED ORDER — KETAMINE HCL 10 MG/ML IJ SOLN
INTRAMUSCULAR | Status: DC | PRN
  Administered 2023-09-16: 25 mg via INTRAVENOUS

## 2023-09-16 MED ORDER — MIDAZOLAM HCL 2 MG/2ML IJ SOLN
INTRAMUSCULAR | Status: DC | PRN
  Administered 2023-09-16: 2 mg via INTRAVENOUS

## 2023-09-16 MED ORDER — DIPHENHYDRAMINE HCL 50 MG/ML IJ SOLN
INTRAMUSCULAR | Status: AC
  Filled 2023-09-16: qty 1

## 2023-09-16 MED ORDER — ACETAMINOPHEN 500 MG PO TABS
1000.0000 mg | ORAL_TABLET | Freq: Once | ORAL | Status: AC
  Administered 2023-09-16: 1000 mg via ORAL
  Filled 2023-09-16: qty 2

## 2023-09-16 MED ORDER — ALBUMIN HUMAN 5 % IV SOLN: INTRAVENOUS | Status: DC | PRN

## 2023-09-16 MED ORDER — ESMOLOL HCL 100 MG/10ML IV SOLN
INTRAVENOUS | Status: DC | PRN
  Administered 2023-09-16: 10 mg via INTRAVENOUS

## 2023-09-16 MED ORDER — MUPIROCIN 2 % EX OINT: 1.0000 | TOPICAL_OINTMENT | Freq: Two times a day (BID) | CUTANEOUS | 0 refills | Status: AC

## 2023-09-16 MED ORDER — SALINE SPRAY 0.65 % NA SOLN: 2.0000 | Freq: Four times a day (QID) | NASAL | 2 refills | Status: AC

## 2023-09-16 MED ORDER — EPINEPHRINE HCL (NASAL) 0.1 % NA SOLN
NASAL | Status: AC
  Filled 2023-09-16: qty 90

## 2023-09-16 MED ORDER — ONDANSETRON HCL 4 MG/2ML IJ SOLN
INTRAMUSCULAR | Status: DC | PRN
  Administered 2023-09-16: 4 mg via INTRAVENOUS

## 2023-09-16 MED ORDER — FENTANYL CITRATE (PF) 250 MCG/5ML IJ SOLN
INTRAMUSCULAR | Status: DC | PRN
  Administered 2023-09-16: 100 ug via INTRAVENOUS
  Administered 2023-09-16: 50 ug via INTRAVENOUS

## 2023-09-16 MED ORDER — PROPOFOL 10 MG/ML IV BOLUS
INTRAVENOUS | Status: DC | PRN
  Administered 2023-09-16: 160 mg via INTRAVENOUS
  Administered 2023-09-16: 25 ug/kg/min via INTRAVENOUS

## 2023-09-16 MED ORDER — HYDROMORPHONE HCL 1 MG/ML IJ SOLN
INTRAMUSCULAR | Status: DC | PRN
Start: 2023-09-16 — End: 2023-09-16
  Administered 2023-09-16: .5 mg via INTRAVENOUS

## 2023-09-16 MED ORDER — DIPHENHYDRAMINE HCL 50 MG/ML IJ SOLN
INTRAMUSCULAR | Status: DC | PRN
  Administered 2023-09-16: 25 mg via INTRAVENOUS

## 2023-09-16 MED ORDER — HYDROMORPHONE HCL 1 MG/ML IJ SOLN
0.2500 mg | INTRAMUSCULAR | Status: DC | PRN
  Administered 2023-09-16: 0.5 mg via INTRAVENOUS

## 2023-09-16 MED ORDER — 0.9 % SODIUM CHLORIDE (POUR BTL) OPTIME
TOPICAL | Status: DC | PRN
  Administered 2023-09-16: 1000 mL

## 2023-09-16 MED ORDER — ROCURONIUM BROMIDE 10 MG/ML (PF) SYRINGE
PREFILLED_SYRINGE | INTRAVENOUS | Status: DC | PRN
  Administered 2023-09-16: 70 mg via INTRAVENOUS

## 2023-09-16 MED ORDER — ESMOLOL HCL 100 MG/10ML IV SOLN
INTRAVENOUS | Status: AC
  Filled 2023-09-16: qty 10

## 2023-09-16 MED ORDER — VASOPRESSIN 20 UNIT/ML IV SOLN
INTRAVENOUS | Status: DC | PRN
  Administered 2023-09-16 (×2): 1 [IU] via INTRAVENOUS
  Administered 2023-09-16: .5 [IU] via INTRAVENOUS

## 2023-09-16 MED ORDER — FLUORESCEIN SODIUM 1 MG OP STRP
ORAL_STRIP | OPHTHALMIC | Status: AC
  Filled 2023-09-16: qty 1

## 2023-09-16 MED ORDER — LIDOCAINE 2% (20 MG/ML) 5 ML SYRINGE
INTRAMUSCULAR | Status: DC | PRN
  Administered 2023-09-16: 60 mg via INTRAVENOUS

## 2023-09-16 MED ORDER — DROPERIDOL 2.5 MG/ML IJ SOLN
0.6250 mg | Freq: Once | INTRAMUSCULAR | Status: DC | PRN
Start: 2023-09-16 — End: 2023-09-16

## 2023-09-16 MED ORDER — MIDAZOLAM HCL 2 MG/2ML IJ SOLN
INTRAMUSCULAR | Status: AC
  Filled 2023-09-16: qty 2

## 2023-09-16 MED ORDER — OXYCODONE HCL 5 MG/5ML PO SOLN: 5.0000 mg | Freq: Once | ORAL | Status: DC | PRN

## 2023-09-16 MED ORDER — LIDOCAINE 2% (20 MG/ML) 5 ML SYRINGE
INTRAMUSCULAR | Status: AC
  Filled 2023-09-16: qty 5

## 2023-09-16 MED ORDER — LACTATED RINGERS IV SOLN: INTRAVENOUS | Status: DC

## 2023-09-16 MED ORDER — KETAMINE HCL 50 MG/5ML IJ SOSY
PREFILLED_SYRINGE | INTRAMUSCULAR | Status: AC
  Filled 2023-09-16: qty 5

## 2023-09-16 MED ORDER — FENTANYL CITRATE (PF) 250 MCG/5ML IJ SOLN
INTRAMUSCULAR | Status: AC
  Filled 2023-09-16: qty 5

## 2023-09-16 MED ORDER — FLUORESCEIN SODIUM 1 MG OP STRP
ORAL_STRIP | OPHTHALMIC | Status: DC | PRN
  Administered 2023-09-16: 1

## 2023-09-16 MED ORDER — BACITRACIN ZINC 500 UNIT/GM EX OINT
TOPICAL_OINTMENT | CUTANEOUS | Status: AC
  Filled 2023-09-16: qty 28.35

## 2023-09-16 MED ORDER — ORAL CARE MOUTH RINSE: 15.0000 mL | Freq: Once | OROMUCOSAL | Status: AC

## 2023-09-16 MED ORDER — ONDANSETRON HCL 4 MG/2ML IJ SOLN: 4.0000 mg | Freq: Once | INTRAMUSCULAR | Status: DC | PRN

## 2023-09-16 MED ORDER — OXYCODONE HCL 5 MG PO TABS: 5.0000 mg | ORAL_TABLET | Freq: Four times a day (QID) | ORAL | 0 refills | Status: AC | PRN

## 2023-09-16 MED ORDER — PHENYLEPHRINE 80 MCG/ML (10ML) SYRINGE FOR IV PUSH (FOR BLOOD PRESSURE SUPPORT)
PREFILLED_SYRINGE | INTRAVENOUS | Status: DC | PRN
  Administered 2023-09-16: 80 ug via INTRAVENOUS
  Administered 2023-09-16: 120 ug via INTRAVENOUS
  Administered 2023-09-16: 160 ug via INTRAVENOUS

## 2023-09-16 MED ORDER — OXYCODONE HCL 5 MG PO TABS: 5.0000 mg | ORAL_TABLET | Freq: Once | ORAL | Status: DC | PRN

## 2023-09-16 MED ORDER — LIDOCAINE-EPINEPHRINE 1 %-1:100000 IJ SOLN
INTRAMUSCULAR | Status: AC
  Filled 2023-09-16: qty 1

## 2023-09-16 MED ORDER — VASOPRESSIN 20 UNIT/ML IV SOLN
INTRAVENOUS | Status: AC
  Filled 2023-09-16: qty 1

## 2023-09-16 MED ORDER — CHLORHEXIDINE GLUCONATE 0.12 % MT SOLN: 15.0000 mL | Freq: Once | OROMUCOSAL | Status: AC

## 2023-09-16 MED ORDER — EPINEPHRINE HCL (NASAL) 0.1 % NA SOLN
NASAL | Status: DC | PRN
  Administered 2023-09-16: 20 mL via NASAL

## 2023-09-16 MED ORDER — CHLORHEXIDINE GLUCONATE 0.12 % MT SOLN
OROMUCOSAL | Status: AC
  Administered 2023-09-16: 15 mL via OROMUCOSAL
  Filled 2023-09-16: qty 15

## 2023-09-16 SURGICAL SUPPLY — 32 items
ANTIFOG SOL W/FOAM PAD STRL (MISCELLANEOUS) ×1 IMPLANT
BLADE SHAVER TURBINATE 11X2.9 (BLADE) ×2 IMPLANT
COAGULATOR SUCT SWTCH 10FR 6 (ELECTROSURGICAL) ×2 IMPLANT
ELECT NDL TIP 2.8 STRL (NEEDLE) IMPLANT
ELECT NEEDLE TIP 2.8 STRL (NEEDLE) IMPLANT
ELECT REM PT RETURN 9FT ADLT (ELECTROSURGICAL) ×1 IMPLANT
ELECTRODE REM PT RTRN 9FT ADLT (ELECTROSURGICAL) ×2 IMPLANT
GAUZE SPONGE 2X2 8PLY STRL LF (GAUZE/BANDAGES/DRESSINGS) ×2 IMPLANT
GLOVE BIO SURGEON STRL SZ7.5 (GLOVE) ×2 IMPLANT
GLOVE BIOGEL PI IND STRL 8 (GLOVE) ×2 IMPLANT
GOWN STRL REUS W/ TWL LRG LVL3 (GOWN DISPOSABLE) ×4 IMPLANT
GOWN STRL REUS W/ TWL XL LVL3 (GOWN DISPOSABLE) ×2 IMPLANT
KIT BASIN OR (CUSTOM PROCEDURE TRAY) ×2 IMPLANT
KIT TURNOVER KIT B (KITS) ×2 IMPLANT
MANIFOLD NEPTUNE II (INSTRUMENTS) ×2 IMPLANT
NDL PRECISIONGLIDE 27X1.5 (NEEDLE) ×2 IMPLANT
NEEDLE PRECISIONGLIDE 27X1.5 (NEEDLE) ×1 IMPLANT
NS IRRIG 1000ML POUR BTL (IV SOLUTION) ×2 IMPLANT
PATTIES SURGICAL .5 X3 (DISPOSABLE) ×2 IMPLANT
SHEATH ENDOSCRUB 0 DEG (SHEATH) ×2 IMPLANT
SOLUTION ANTFG W/FOAM PAD STRL (MISCELLANEOUS) IMPLANT
SPLINT NASAL DOYLE BI-VL (GAUZE/BANDAGES/DRESSINGS) ×2 IMPLANT
SUT CHROMIC 4 0 P 3 18 (SUTURE) IMPLANT
SUT CHROMIC 4 0 SH 27 (SUTURE) IMPLANT
SUT CHROMIC 5 0 P 3 (SUTURE) ×2 IMPLANT
SUT PLAIN GUT FAST 5-0 (SUTURE) ×2 IMPLANT
SUT SILK 3 0 PS 1 (SUTURE) ×2 IMPLANT
SWAB COLLECTION DEVICE MRSA (MISCELLANEOUS) ×2 IMPLANT
TOWEL GREEN STERILE FF (TOWEL DISPOSABLE) ×2 IMPLANT
TRAY ENT MC OR (CUSTOM PROCEDURE TRAY) ×2 IMPLANT
TUBE SALEM SUMP 16F (TUBING) ×2 IMPLANT
TUBING EXTENTION W/L.L. (IV SETS) ×2 IMPLANT

## 2023-09-16 NOTE — Anesthesia Preprocedure Evaluation (Addendum)
Anesthesia Evaluation  Patient identified by MRN, date of birth, ID band Patient awake    Reviewed: Allergy & Precautions, NPO status , Patient's Chart, lab work & pertinent test results, reviewed documented beta blocker date and time   Airway Mallampati: III  TM Distance: >3 FB     Dental no notable dental hx. (+) Teeth Intact, Dental Advisory Given   Pulmonary asthma , sleep apnea and Continuous Positive Airway Pressure Ventilation  Deviated nasal septum Hypertrophy of both inferior nasal turbinates Nasal obstruction   Pulmonary exam normal breath sounds clear to auscultation       Cardiovascular hypertension, Pt. on medications Normal cardiovascular exam Rhythm:Regular Rate:Normal  Echo 08/22/21 1. Left ventricular ejection fraction, by estimation, is 65 to 70%. The  left ventricle has normal function. The left ventricle has no regional  wall motion abnormalities. Left ventricular diastolic parameters are  consistent with Grade I diastolic  dysfunction (impaired relaxation).   2. Right ventricular systolic function is normal. The right ventricular  size is normal.   3. The mitral valve is normal in structure. No evidence of mitral valve  regurgitation. No evidence of mitral stenosis.   4. The aortic valve is tricuspid. Aortic valve regurgitation is not  visualized. No aortic stenosis is present.   5. The inferior vena cava is normal in size with greater than 50%  respiratory variability, suggesting right atrial pressure of 3 mmHg.   EKG 02/07/22 NSR, cannot rule out anterior infarct     Neuro/Psych  Headaches Hx/o cerebellar tumor  Neuromuscular disease  negative psych ROS   GI/Hepatic hiatal hernia,GERD  Medicated,,Hepatic steatosis   Endo/Other  diabetes, Well Controlled, Type 2, Oral Hypoglycemic Agents  Class 3 obesityGLP-1 RA therapy- last dose Hyperlipidemia  Renal/GU Hx/o renal calculi Bladder  dysfunction  Areflexic bladder/ urinary incontinence    Musculoskeletal  (+) Arthritis , Osteoarthritis,  Fibromyalgia -, narcotic dependent  Abdominal  (+) + obese  Peds  Hematology  (+) Blood dyscrasia, anemia   Anesthesia Other Findings   Reproductive/Obstetrics                              Anesthesia Physical Anesthesia Plan  ASA: 3  Anesthesia Plan: General   Post-op Pain Management: Dilaudid IV, Precedex, Tylenol PO (pre-op)* and Ketamine IV*   Induction: Intravenous  PONV Risk Score and Plan: 4 or greater and Treatment may vary due to age or medical condition, Ondansetron, Dexamethasone and Midazolam  Airway Management Planned: Oral ETT  Additional Equipment: None  Intra-op Plan:   Post-operative Plan: Extubation in OR  Informed Consent: I have reviewed the patients History and Physical, chart, labs and discussed the procedure including the risks, benefits and alternatives for the proposed anesthesia with the patient or authorized representative who has indicated his/her understanding and acceptance.     Dental advisory given  Plan Discussed with: CRNA and Anesthesiologist  Anesthesia Plan Comments:         Anesthesia Quick Evaluation

## 2023-09-16 NOTE — Anesthesia Procedure Notes (Signed)
Procedure Name: Intubation Date/Time: 09/16/2023 2:58 PM  Performed by: Lianne Cure, RNPre-anesthesia Checklist: Patient identified, Emergency Drugs available, Suction available and Patient being monitored Patient Re-evaluated:Patient Re-evaluated prior to induction Oxygen Delivery Method: Circle System Utilized Preoxygenation: Pre-oxygenation with 100% oxygen Induction Type: IV induction Ventilation: Mask ventilation without difficulty Laryngoscope Size: Mac and 3 Grade View: Grade I Tube type: Oral Rae Tube size: 6.5 mm Number of attempts: 1 Airway Equipment and Method: Stylet and Oral airway Placement Confirmation: ETT inserted through vocal cords under direct vision, positive ETCO2 and breath sounds checked- equal and bilateral Secured at: 20 cm Tube secured with: Tape Dental Injury: Teeth and Oropharynx as per pre-operative assessment

## 2023-09-16 NOTE — Transfer of Care (Signed)
Immediate Anesthesia Transfer of Care Note  Patient: Virginia Luna  Procedure(s) Performed: NASAL SEPTOPLASTY WITH BILATERAL INFERIOR TURBINATE REDUCTION (Bilateral: Nose)  Patient Location: PACU  Anesthesia Type:General  Level of Consciousness: awake, drowsy, and patient cooperative  Airway & Oxygen Therapy: Patient Spontanous Breathing and Patient connected to face mask oxygen  Post-op Assessment: Report given to RN, Post -op Vital signs reviewed and stable, and Patient moving all extremities X 4  Post vital signs: Reviewed and stable  Last Vitals:  Vitals Value Taken Time  BP 124/66 09/16/23 1611  Temp    Pulse 104 09/16/23 1613  Resp 15 09/16/23 1613  SpO2 97 % 09/16/23 1613  Vitals shown include unfiled device data.  Last Pain:  Vitals:   09/16/23 1223  TempSrc:   PainSc: 8       Patients Stated Pain Goal: 2 (09/16/23 1223)  Complications: No notable events documented.

## 2023-09-16 NOTE — Discharge Instructions (Signed)
DISCHARGE INSTRUCTIONS FOLLOWING YOUR SEPTOPLASTY and inferior turbinate reductions  1.  Bloody nasal discharge for several days is common in the post operative period. Please use over the counter saline sprays every 3 hours while you are awake to lubricate your nasal tissues. Please use over the counter antibiotic ointment twice daily until your first post-operative visit.  2.  Use nasal slings to absorb nasal drainage 3.  Swelling and bruising should be minimal 4.  Your nose will be more congested because of blood and mucus until the splints are removed 5.  Take it easy, as reduced energy for several days following surgery is normal 6.  You should be able to return to work in 5 to 7 days 7.  You will have an appointment in 5-7 days following surgery for removal of your splints 8.  Your nose is "fragile" for 6 weeks, avoid trauma to the nose during this period.  MEDICATIONS 1. Resume all home medications as directed. 2. Start new discharge medications as directed. 3. Do not drive or operate machinery while taking narcotic pain medications.   DIET 1. Resume same diet prior to your hospital admission.   RETURN 1. Follow-up in the ENT clinic as scheduled 2. Call [phone] to confirm or schedule your appointment.  3. Call [phone] or go to the Emergency Department near you for any symptoms such as fevers with temperature greater than 101.5 F, chills, persistent nausea and vomiting, severe pain not controlled with medications, purulent or foul-smelling drainage from the wound site, or for any acute problems or il

## 2023-09-16 NOTE — Anesthesia Postprocedure Evaluation (Signed)
Anesthesia Post Note  Patient: Virginia Luna  Procedure(s) Performed: NASAL SEPTOPLASTY WITH BILATERAL INFERIOR TURBINATE REDUCTION (Bilateral: Nose)     Patient location during evaluation: PACU Anesthesia Type: General Level of consciousness: awake and alert and oriented Pain management: pain level controlled Vital Signs Assessment: post-procedure vital signs reviewed and stable Respiratory status: spontaneous breathing, nonlabored ventilation and respiratory function stable Cardiovascular status: blood pressure returned to baseline and stable Postop Assessment: no apparent nausea or vomiting Anesthetic complications: no   No notable events documented.  Last Vitals:  Vitals:   09/16/23 1630 09/16/23 1640  BP: 109/61 120/62  Pulse: 100 (!) 103  Resp: 16 13  Temp:  36.7 C  SpO2: 92% 92%    Last Pain:  Vitals:   09/16/23 1630  TempSrc:   PainSc: 5                  Noemie Devivo A.

## 2023-09-16 NOTE — Op Note (Signed)
OPERATIVE NOTE  Virginia Luna Date/Time of Admission: 09/16/2023 11:43 AM  CSN: [id number];MRN:[id number] Attending Provider: Scarlette Ar, MD Room/Bed: MCPO/NONE DOB: 1960-08-24 Age: 63 y.o.   Pre-Op Diagnosis: Deviated nasal septum; Hypertrophy of both inferior nasal turbinates; Nasal obstruction  Post-Op Diagnosis: Deviated nasal septum; Hypertrophy of both inferior nasal turbinates; Nasal obstruction  Procedure: Septoplasty (81191) Bilateral inferior turbinate submucous reduction and outfracture (30140 modifier 50)  Anesthesia: General  Surgeon(s): Mervin Kung, MD  Staff: Circulator: Rogers Seeds, RN Relief Scrub: Colan Neptune Scrub Person: Coralee North T  Implants: * No implants in log *  Specimens: * No specimens in log *  Complications: none  EBL: 50 ML  IVF: Per anesthesia ML  Condition: stable  Operative Findings:  Right deviated septum/septal spur Bilateral 4+ inferior turbinate hypertrophy  Description of Operation: The patient was identified in the pre-op area and consent confirmed in the chart. The patient was then brought to the operative suite, placed in a supine position, and general anesthesia induced. We then performed an operative time out to confirm proper patient and surgery to be performed. After all were in agreement, we began with surgery.  The head of the bed was then turned 180 degrees. 1:1000 epinephrine-soaked pledgets were placed in both nasal cavities. The patient was then prepped and draped in a standard fashion for septoplasty.  After insertion of a nasal speculum, the septum was injected with 1% lidocaine with epinephrine. A hemitransfixion incision was performed along the left side and a subperichondrial flap was elevated. The flap was then elevated on the opposite side and the deviated cartilage was removed leaving a 1.5 centimeter dorsal and anterior/inferior strut. Deviated bony septum was also excised with double  action scissors and open Jansen-Middleton forceps.  We proceeded with the bilateral inferior turbinate reduction. Starting on the left side, a 15 blade scalpel was used to make an incision in the axilla of the head of the inferior turbinate. A cottle was used to raise a medial submucoperiosteal plane. The 2.33mm turbinate microdebrider was used to perform submucous resection. The turbinate was outfractured with the boise elevator and hemostasis achieved with bovie and epi pledgets. The right side was treated identically.   A 4-0 chromic running quilting suture was used to oppose both perichondrial flaps and the hemitransfixion incision was closed with interrupted  5-0 Chromic sutures. Doyle splints were placed bilaterally followed by a drip pad. The patient was extubated in the operating room and taken to the recovery area in stable condition. The patient tolerated the procedure without difficulty.    Mervin Kung, MD [facility]  09/16/2023

## 2023-09-16 NOTE — H&P (Signed)
Virginia Luna is an 63 y.o. female.    Chief Complaint:  Nasal obstruction  HPI: Patient presents today for planned elective procedure.  He/she denies any interval change in history since office visit on 09/06/23.   Past Medical History:  Diagnosis Date   Allergies    Anemia    Arthritis    Asthma    Back pain    Chronic pain    Diabetes (HCC)    Diabetes mellitus without complication (HCC)    Edema, lower extremity    Fibroid    Fibromyalgia    Chronic   GERD (gastroesophageal reflux disease)    Headache    migraines   Hepatic steatosis    High blood pressure    History of hiatal hernia    History of kidney stones    History of stomach ulcers    IBS (irritable bowel syndrome)    Joint pain    Sleep apnea    did use cpap-lost 25lb-says she does not need it   NO CPAP   Wears contact lenses     Past Surgical History:  Procedure Laterality Date   BIOPSY  12/04/2022   Procedure: BIOPSY;  Surgeon: Lynann Bologna, DO;  Location: [facility];  Service: Gastroenterology;;   BRAIN SURGERY Left 11/07/2021   [facility] Dr. Clydene Pugh   BREAST BIOPSY     BREAST EXCISIONAL BIOPSY     BREAST LUMPECTOMY WITH RADIOACTIVE SEED LOCALIZATION Bilateral 11/24/2014   Procedure: BILATERAL BREAST LUMPECTOMY WITH RADIOACTIVE SEED LOCALIZATION;  Surgeon: Harriette Bouillon, MD;  Location: [facility];  Service: General;  Laterality: Bilateral;   CHOLECYSTECTOMY     COLONOSCOPY     COLONOSCOPY WITH PROPOFOL N/A 12/04/2022   Procedure: COLONOSCOPY WITH PROPOFOL;  Surgeon: Lynann Bologna, DO;  Location: [facility];  Service: Gastroenterology;  Laterality: N/A;   CYSTOSCOPY WITH RETROGRADE PYELOGRAM, URETEROSCOPY AND STENT PLACEMENT Right 10/16/2022   Procedure: CYSTOSCOPY WITH RETROGRADE PYELOGRAM, URETEROSCOPY AND STENT PLACEMENT;  Surgeon: Noel Christmas, MD;  Location: [facility];  Service: Urology;  Laterality: Right;   DILATION AND CURETTAGE OF UTERUS      ESOPHAGOGASTRODUODENOSCOPY (EGD) WITH PROPOFOL N/A 07/23/2016   Procedure: ESOPHAGOGASTRODUODENOSCOPY (EGD) WITH PROPOFOL;  Surgeon: Carman Ching, MD;  Location: [facility];  Service: Endoscopy;  Laterality: N/A;   ESOPHAGOGASTRODUODENOSCOPY (EGD) WITH PROPOFOL N/A 06/11/2018   Procedure: ESOPHAGOGASTRODUODENOSCOPY (EGD) WITH PROPOFOL;  Surgeon: Carman Ching, MD;  Location: [facility];  Service: Endoscopy;  Laterality: N/A;   ESOPHAGOGASTRODUODENOSCOPY (EGD) WITH PROPOFOL N/A 12/04/2022   Procedure: ESOPHAGOGASTRODUODENOSCOPY (EGD) WITH PROPOFOL;  Surgeon: Lynann Bologna, DO;  Location: [facility];  Service: Gastroenterology;  Laterality: N/A;   HOLMIUM LASER APPLICATION Right 10/16/2022   Procedure: HOLMIUM LASER APPLICATION;  Surgeon: Noel Christmas, MD;  Location: [facility];  Service: Urology;  Laterality: Right;   LUMBAR LAMINECTOMY  2010   ORIF WRIST FRACTURE Right 11/24/2020   Procedure: OPEN REDUCTION INTERNAL FIXATION RIGHT WRIST FRACTURE;  Surgeon: Sheral Apley, MD;  Location: [facility];  Service: Orthopedics;  Laterality: Right;   OVARIAN CYST SURGERY     UMBILICAL HERNIA REPAIR     age 31   UPPER GI ENDOSCOPY      Family History  Problem Relation Age of Onset   Obesity Mother    Diabetes Father    High blood pressure Father    Sudden death Father    Breast cancer Paternal Grandmother    Breast cancer Paternal Aunt  Social History:  reports that she has never smoked. She has never been exposed to tobacco smoke. She has never used smokeless tobacco. She reports that she does not drink alcohol and does not use drugs.  Allergies:  Allergies  Allergen Reactions   Rocephin [Ceftriaxone] Anaphylaxis   Morphine And Codeine Itching and Nausea And Vomiting    Doesn't work   Prednisone Itching and Swelling   Sulfa Antibiotics Itching and Swelling   Amitriptyline Other (See Comments)   Penicillin G    Penicillin G Sodium Other (See Comments)     Facility-Administered Medications Prior to Admission  Medication Dose Route Frequency Provider Last Rate Last Admin   botulinum toxin Type A (BOTOX) injection 200 Units  200 Units Intramuscular Once Jaffe, Adam R, DO       lidocaine (XYLOCAINE) 1 % (with pres) injection 6 mL  6 mL Other Once        lidocaine (XYLOCAINE) 1 % (with pres) injection 6 mL  6 mL Other Once        lidocaine (XYLOCAINE) 1 % (with pres) injection 6 mL  6 mL Other Once        Medications Prior to Admission  Medication Sig Dispense Refill   acetaminophen (TYLENOL) 325 MG tablet Take 1-2 tablets (325-650 mg total) by mouth every 4 (four) hours as needed for mild pain.     amLODipine (NORVASC) 10 MG tablet Take 10 mg by mouth in the morning.     atorvastatin (LIPITOR) 10 MG tablet Take 1 tablet (10 mg total) by mouth at bedtime. 30 tablet 0   azelastine (ASTELIN) 0.1 % nasal spray Place 1 spray into both nostrils 2 (two) times daily as needed for rhinitis. 30 mL 0   cyanocobalamin (VITAMIN B12) 1000 MCG/ML injection Inject 1,000 mcg into the muscle every 14 (fourteen) days.     desloratadine (CLARINEX) 5 MG tablet Take 5 mg by mouth daily.     diclofenac (VOLTAREN) 75 MG EC tablet Take 75 mg by mouth 2 (two) times daily.     EPINEPHrine 0.3 mg/0.3 mL IJ SOAJ injection Inject 0.3 mg into the muscle as needed for anaphylaxis.     fluticasone (FLONASE) 50 MCG/ACT nasal spray Place 1 spray into both nostrils daily as needed for rhinitis or allergies.     fluticasone-salmeterol (ADVAIR) 250-50 MCG/ACT AEPB Inhale 1 puff into the lungs 2 (two) times daily.     gabapentin (NEURONTIN) 300 MG capsule Take 300 mg by mouth 2 (two) times daily.     HYDROcodone-acetaminophen (NORCO/VICODIN) 5-325 MG tablet Take 3 tablets by mouth at bedtime.     levocetirizine (XYZAL) 5 MG tablet Take 1 tablet (5 mg total) by mouth every evening. 30 tablet 0   lidocaine-prilocaine (EMLA) cream Apply 1 Application topically as needed. 30 g 0    linaclotide (LINZESS) 290 MCG CAPS capsule Take 290 mcg by mouth every morning.     meclizine (ANTIVERT) 25 MG tablet Take 1 tablet (25 mg total) by mouth 3 (three) times daily as needed for dizziness. (Patient taking differently: Take 25 mg by mouth 2 (two) times daily.) 60 tablet 0   melatonin 5 MG TABS Take 1 tablet (5 mg total) by mouth at bedtime. 30 tablet 0   methocarbamol (ROBAXIN) 500 MG tablet Take 750 mg by mouth 2 (two) times daily.     montelukast (SINGULAIR) 10 MG tablet Take 1 tablet (10 mg total) by mouth at bedtime. 30 tablet 0   naratriptan (  AMERGE) 2.5 MG tablet Take 1 tablet (2.5 mg total) by mouth as needed for migraine. Take one (1) tablet at onset of headache; if returns or does not resolve, may repeat after 4 hours; do not exceed five (5) mg in 24 hours. 10 tablet 5   NON FORMULARY Pt uses a cpap nightly     ondansetron (ZOFRAN-ODT) 8 MG disintegrating tablet Take 8 mg by mouth every 8 (eight) hours as needed.     Oxcarbazepine (TRILEPTAL) 300 MG tablet Take 3 tablets (900 mg total) by mouth at bedtime. 270 tablet 1   oxybutynin (DITROPAN) 5 MG tablet Take 1 tablet (5 mg total) by mouth every 8 (eight) hours as needed for bladder spasms. (Patient taking differently: Take 5 mg by mouth at bedtime.) 30 tablet 0   pantoprazole (PROTONIX) 40 MG tablet Take 1 tablet (40 mg total) by mouth daily. (Patient taking differently: Take 40 mg by mouth in the morning and at bedtime.) 30 tablet 0   QUEtiapine (SEROQUEL) 300 MG tablet Take 1 tablet (300 mg total) by mouth at bedtime. 90 tablet 0   Semaglutide,0.25 or 0.5MG /DOS, (OZEMPIC, 0.25 OR 0.5 MG/DOSE,) 2 MG/1.5ML SOPN Inject 0.5 mg into the skin once a week. (Patient taking differently: Inject 0.5 mg into the skin every Sunday.) 1.5 mL 0   senna-docusate (SENOKOT-S) 8.6-50 MG tablet Take 2 tablets by mouth at bedtime. 60 tablet 0   tamsulosin (FLOMAX) 0.4 MG CAPS capsule Take 1 capsule (0.4 mg total) by mouth at bedtime. 30 capsule 0    Vitamin D, Ergocalciferol, (DRISDOL) 1.25 MG (50000 UNIT) CAPS capsule TAKE ONE CAPSULE BY MOUTH EVERY 7 DAYS (Patient taking differently: Take 50,000 Units by mouth every Sunday.) 4 capsule 0   zolpidem (AMBIEN) 10 MG tablet Take 1 tablet (10 mg total) by mouth at bedtime. 30 tablet 5   zonisamide (ZONEGRAN) 100 MG capsule Take 3 capsules (300 mg total) by mouth daily. 90 capsule 5   albuterol (PROVENTIL) (2.5 MG/3ML) 0.083% nebulizer solution Take 2.5 mg by nebulization every 6 (six) hours as needed (Asthma).     albuterol (VENTOLIN HFA) 108 (90 Base) MCG/ACT inhaler Inhale 2 puffs into the lungs every 4 (four) hours as needed.     B-D 3CC LUER-LOK SYR 25GX1" 25G X 1" 3 ML MISC SMARTSIG:1 IM Every 2 Weeks     botulinum toxin Type A (BOTOX) 200 units injection Inject 155 units IM into multiple site in the face,neck and head once every 90 days 1 each 4   COMIRNATY syringe      eletriptan (RELPAX) 40 MG tablet Take 1 tablet (40 mg total) by mouth once for 1 dose. May repeat in 2 hours if headache persists or recurs. (Patient not taking: Reported on 09/11/2023) 10 tablet 2   furosemide (LASIX) 20 MG tablet Take 20 mg by mouth daily as needed for fluid.     ondansetron (ZOFRAN) 4 MG tablet Take 1 tablet (4 mg total) by mouth every 8 (eight) hours as needed for nausea or vomiting. (Patient not taking: Reported on 09/11/2023) 20 tablet 5   ondansetron (ZOFRAN-ODT) 4 MG disintegrating tablet TAKE ONE TABLET BY MOUTH every EIGHT hours AS NEEDED FOR NAUSEA/VOMITING (Patient not taking: Reported on 09/11/2023) 90 tablet 2   polyethylene glycol (MIRALAX / GLYCOLAX) 17 g packet Take 17 g by mouth daily as needed for mild constipation. 14 each 0   Ubrogepant (UBRELVY) 100 MG TABS Take 1 tablet (100 mg total) by mouth as needed (  take 1 tab at the earlist onset of a migraine. May repeat in 2 hours. Max 2 tabs in 24 hours). (Patient not taking: Reported on 09/11/2023) 16 tablet 5    Results for orders placed or  performed during the hospital encounter of 09/16/23 (from the past 48 hours)  Glucose, capillary     Status: None   Collection Time: 09/16/23 12:05 PM  Result Value Ref Range   Glucose-Capillary 88 70 - 99 mg/dL    Comment: Glucose reference range applies only to samples taken after fasting for at least 8 hours.  CBC per protocol     Status: Abnormal   Collection Time: 09/16/23 12:20 PM  Result Value Ref Range   WBC 8.9 4.0 - 10.5 K/uL   RBC 4.05 3.87 - 5.11 MIL/uL   Hemoglobin 11.5 (L) 12.0 - 15.0 g/dL   HCT 16.1 09.6 - 04.5 %   MCV 90.4 80.0 - 100.0 fL   MCH 28.4 26.0 - 34.0 pg   MCHC 31.4 30.0 - 36.0 g/dL   RDW 40.9 (H) 81.1 - 91.4 %   Platelets 426 (H) 150 - 400 K/uL   nRBC 0.0 0.0 - 0.2 %    Comment: Performed at [facility], [address] [place], [place] [address]  Basic metabolic panel per protocol     Status: Abnormal   Collection Time: 09/16/23 12:20 PM  Result Value Ref Range   Sodium 141 135 - 145 mmol/L   Potassium 3.4 (L) 3.5 - 5.1 mmol/L   Chloride 106 98 - 111 mmol/L   CO2 23 22 - 32 mmol/L   Glucose, Bld 98 70 - 99 mg/dL    Comment: Glucose reference range applies only to samples taken after fasting for at least 8 hours.   BUN 14 8 - 23 mg/dL   Creatinine, Ser 6.21 0.44 - 1.00 mg/dL   Calcium 9.5 8.9 - 30.8 mg/dL   GFR, Estimated >65 >78 mL/min    Comment: (NOTE) Calculated using the CKD-EPI Creatinine Equation (2021)    Anion gap 12 5 - 15    Comment: Performed at [facility], [address] [place], [place] [address]  Glucose, capillary     Status: None   Collection Time: 09/16/23  2:01 PM  Result Value Ref Range   Glucose-Capillary 80 70 - 99 mg/dL    Comment: Glucose reference range applies only to samples taken after fasting for at least 8 hours.   Comment 1 Notify RN    Comment 2 Document in Chart    No results found.  ROS: negative other than stated in HPI  Blood pressure (!) 143/74, pulse 100, temperature 98.1 F (36.7 C),  temperature source Oral, resp. rate 18, height 5\' 1"  (1.549 m), weight 108.9 kg, SpO2 97%.  PHYSICAL EXAM: General: Resting comfortably in NAD  Lungs: Non-labored respiratinos  Studies Reviewed: none   Assessment/Plan Deviated nasal septum Inferior turbinate reduction  Proceed with septoplasty and inferior turbinate reduction under GA. R/B/A/ discussed. Risks discussed in detail including pain, bleeding, infection failure to relieve symptoms, nasal septal perforation, nasal synechiae, need for further surgery, risks of anesthesia. Despite these risks the patient requested to proceed.     Electronically signed by:  Scarlette Ar, MD  Staff Physician Facial Plastic & Reconstructive Surgery Otolaryngology - Head and Neck Surgery [facility]  09/16/2023, 2:19 PM

## 2023-09-17 ENCOUNTER — Encounter (HOSPITAL_COMMUNITY): Payer: Self-pay | Admitting: Otolaryngology

## 2023-09-20 ENCOUNTER — Telehealth: Payer: Self-pay | Admitting: Neurology

## 2023-09-20 NOTE — Telephone Encounter (Signed)
1. Which medications need refilled? (List name and dosage, if known) zonisamide, naratriptan - she has been taking more due to her surgery she has had more headaches  2. Which pharmacy/location is medication to be sent to? (include street and city if [facility]) [facility]

## 2023-09-24 NOTE — Telephone Encounter (Signed)
Patient advised of Dr.Jaffe note, Both were filled on the 17th with 5 additional refills

## 2023-09-25 DIAGNOSIS — Z9889 Other specified postprocedural states: Secondary | ICD-10-CM | POA: Diagnosis not present

## 2023-09-25 DIAGNOSIS — J342 Deviated nasal septum: Secondary | ICD-10-CM | POA: Diagnosis not present

## 2023-09-30 ENCOUNTER — Encounter: Payer: Medicare HMO | Admitting: Physical Medicine and Rehabilitation

## 2023-10-03 DIAGNOSIS — J301 Allergic rhinitis due to pollen: Secondary | ICD-10-CM | POA: Diagnosis not present

## 2023-10-03 DIAGNOSIS — J3089 Other allergic rhinitis: Secondary | ICD-10-CM | POA: Diagnosis not present

## 2023-10-03 DIAGNOSIS — J3081 Allergic rhinitis due to animal (cat) (dog) hair and dander: Secondary | ICD-10-CM | POA: Diagnosis not present

## 2023-10-07 ENCOUNTER — Encounter: Payer: Self-pay | Admitting: Physical Medicine and Rehabilitation

## 2023-10-07 ENCOUNTER — Encounter: Payer: Self-pay | Admitting: Family Medicine

## 2023-10-07 DIAGNOSIS — E1169 Type 2 diabetes mellitus with other specified complication: Secondary | ICD-10-CM | POA: Diagnosis not present

## 2023-10-07 DIAGNOSIS — E538 Deficiency of other specified B group vitamins: Secondary | ICD-10-CM | POA: Diagnosis not present

## 2023-10-07 DIAGNOSIS — Z6841 Body Mass Index (BMI) 40.0 and over, adult: Secondary | ICD-10-CM | POA: Diagnosis not present

## 2023-10-07 DIAGNOSIS — Z599 Problem related to housing and economic circumstances, unspecified: Secondary | ICD-10-CM | POA: Diagnosis not present

## 2023-10-07 DIAGNOSIS — E66813 Obesity, class 3: Secondary | ICD-10-CM | POA: Diagnosis not present

## 2023-10-07 DIAGNOSIS — G4733 Obstructive sleep apnea (adult) (pediatric): Secondary | ICD-10-CM | POA: Diagnosis not present

## 2023-10-08 DIAGNOSIS — J3089 Other allergic rhinitis: Secondary | ICD-10-CM | POA: Diagnosis not present

## 2023-10-08 DIAGNOSIS — J3081 Allergic rhinitis due to animal (cat) (dog) hair and dander: Secondary | ICD-10-CM | POA: Diagnosis not present

## 2023-10-08 DIAGNOSIS — J301 Allergic rhinitis due to pollen: Secondary | ICD-10-CM | POA: Diagnosis not present

## 2023-10-15 DIAGNOSIS — J3089 Other allergic rhinitis: Secondary | ICD-10-CM | POA: Diagnosis not present

## 2023-10-15 DIAGNOSIS — J301 Allergic rhinitis due to pollen: Secondary | ICD-10-CM | POA: Diagnosis not present

## 2023-10-15 DIAGNOSIS — J3081 Allergic rhinitis due to animal (cat) (dog) hair and dander: Secondary | ICD-10-CM | POA: Diagnosis not present

## 2023-10-17 ENCOUNTER — Ambulatory Visit: Payer: Medicare HMO | Admitting: Psychiatry

## 2023-10-21 ENCOUNTER — Encounter: Payer: Medicare HMO | Admitting: Neurology

## 2023-10-21 ENCOUNTER — Ambulatory Visit (INDEPENDENT_AMBULATORY_CARE_PROVIDER_SITE_OTHER): Payer: Medicare HMO | Admitting: Neurology

## 2023-10-21 DIAGNOSIS — R55 Syncope and collapse: Secondary | ICD-10-CM | POA: Diagnosis not present

## 2023-10-21 NOTE — Progress Notes (Unsigned)
 EEG complete - results pending

## 2023-10-22 ENCOUNTER — Telehealth: Payer: Self-pay | Admitting: Neurology

## 2023-10-22 DIAGNOSIS — J301 Allergic rhinitis due to pollen: Secondary | ICD-10-CM | POA: Diagnosis not present

## 2023-10-22 DIAGNOSIS — J3089 Other allergic rhinitis: Secondary | ICD-10-CM | POA: Diagnosis not present

## 2023-10-22 DIAGNOSIS — J3081 Allergic rhinitis due to animal (cat) (dog) hair and dander: Secondary | ICD-10-CM | POA: Diagnosis not present

## 2023-10-22 NOTE — Progress Notes (Signed)
 ELECTROENCEPHALOGRAM REPORT  Date of Study: 10/21/2023  Patient's Name: Virginia Luna MRN: [id number] Date of Birth: 28-Apr-1961   Clinical History: 63 year old female with migraines and history of cerebellar hemangioblastoma status post resection presents for syncope  Medications: Current Outpatient Medications on File Prior to Visit  Medication Sig Dispense Refill   acetaminophen (TYLENOL) 325 MG tablet Take 1-2 tablets (325-650 mg total) by mouth every 4 (four) hours as needed for mild pain.     albuterol (PROVENTIL) (2.5 MG/3ML) 0.083% nebulizer solution Take 2.5 mg by nebulization every 6 (six) hours as needed (Asthma).     albuterol (VENTOLIN HFA) 108 (90 Base) MCG/ACT inhaler Inhale 2 puffs into the lungs every 4 (four) hours as needed.     amLODipine (NORVASC) 10 MG tablet Take 10 mg by mouth in the morning.     atorvastatin (LIPITOR) 10 MG tablet Take 1 tablet (10 mg total) by mouth at bedtime. 30 tablet 0   azelastine (ASTELIN) 0.1 % nasal spray Place 1 spray into both nostrils 2 (two) times daily as needed for rhinitis. 30 mL 0   B-D 3CC LUER-LOK SYR 25GX1" 25G X 1" 3 ML MISC SMARTSIG:1 IM Every 2 Weeks     botulinum toxin Type A (BOTOX) 200 units injection Inject 155 units IM into multiple site in the face,neck and head once every 90 days 1 each 4   COMIRNATY syringe      cyanocobalamin (VITAMIN B12) 1000 MCG/ML injection Inject 1,000 mcg into the muscle every 14 (fourteen) days.     desloratadine (CLARINEX) 5 MG tablet Take 5 mg by mouth daily.     diclofenac (VOLTAREN) 75 MG EC tablet Take 75 mg by mouth 2 (two) times daily.     eletriptan (RELPAX) 40 MG tablet Take 1 tablet (40 mg total) by mouth once for 1 dose. May repeat in 2 hours if headache persists or recurs. (Patient not taking: Reported on 09/11/2023) 10 tablet 2   EPINEPHrine 0.3 mg/0.3 mL IJ SOAJ injection Inject 0.3 mg into the muscle as needed for anaphylaxis.     fluticasone (FLONASE) 50 MCG/ACT nasal spray Place  1 spray into both nostrils daily as needed for rhinitis or allergies.     fluticasone-salmeterol (ADVAIR) 250-50 MCG/ACT AEPB Inhale 1 puff into the lungs 2 (two) times daily.     furosemide (LASIX) 20 MG tablet Take 20 mg by mouth daily as needed for fluid.     gabapentin (NEURONTIN) 300 MG capsule Take 300 mg by mouth 2 (two) times daily.     levocetirizine (XYZAL) 5 MG tablet Take 1 tablet (5 mg total) by mouth every evening. 30 tablet 0   lidocaine-prilocaine (EMLA) cream Apply 1 Application topically as needed. 30 g 0   linaclotide (LINZESS) 290 MCG CAPS capsule Take 290 mcg by mouth every morning.     meclizine (ANTIVERT) 25 MG tablet Take 1 tablet (25 mg total) by mouth 3 (three) times daily as needed for dizziness. (Patient taking differently: Take 25 mg by mouth 2 (two) times daily.) 60 tablet 0   melatonin 5 MG TABS Take 1 tablet (5 mg total) by mouth at bedtime. 30 tablet 0   methocarbamol (ROBAXIN) 500 MG tablet Take 750 mg by mouth 2 (two) times daily.     montelukast (SINGULAIR) 10 MG tablet Take 1 tablet (10 mg total) by mouth at bedtime. 30 tablet 0   naratriptan (AMERGE) 2.5 MG tablet Take 1 tablet (2.5 mg total) by mouth  as needed for migraine. Take one (1) tablet at onset of headache; if returns or does not resolve, may repeat after 4 hours; do not exceed five (5) mg in 24 hours. 10 tablet 5   NON FORMULARY Pt uses a cpap nightly     ondansetron (ZOFRAN) 4 MG tablet Take 1 tablet (4 mg total) by mouth every 8 (eight) hours as needed for nausea or vomiting. (Patient not taking: Reported on 09/11/2023) 20 tablet 5   ondansetron (ZOFRAN-ODT) 4 MG disintegrating tablet TAKE ONE TABLET BY MOUTH every EIGHT hours AS NEEDED FOR NAUSEA/VOMITING (Patient not taking: Reported on 09/11/2023) 90 tablet 2   ondansetron (ZOFRAN-ODT) 8 MG disintegrating tablet Take 8 mg by mouth every 8 (eight) hours as needed.     Oxcarbazepine (TRILEPTAL) 300 MG tablet Take 3 tablets (900 mg total) by mouth at  bedtime. 270 tablet 1   oxybutynin (DITROPAN) 5 MG tablet Take 1 tablet (5 mg total) by mouth every 8 (eight) hours as needed for bladder spasms. (Patient taking differently: Take 5 mg by mouth at bedtime.) 30 tablet 0   pantoprazole (PROTONIX) 40 MG tablet Take 1 tablet (40 mg total) by mouth daily. (Patient taking differently: Take 40 mg by mouth in the morning and at bedtime.) 30 tablet 0   polyethylene glycol (MIRALAX / GLYCOLAX) 17 g packet Take 17 g by mouth daily as needed for mild constipation. 14 each 0   QUEtiapine (SEROQUEL) 300 MG tablet Take 1 tablet (300 mg total) by mouth at bedtime. 90 tablet 0   Semaglutide,0.25 or 0.5MG /DOS, (OZEMPIC, 0.25 OR 0.5 MG/DOSE,) 2 MG/1.5ML SOPN Inject 0.5 mg into the skin once a week. (Patient taking differently: Inject 0.5 mg into the skin every Sunday.) 1.5 mL 0   senna-docusate (SENOKOT-S) 8.6-50 MG tablet Take 2 tablets by mouth at bedtime. 60 tablet 0   sodium chloride (OCEAN) 0.65 % SOLN nasal spray Place 2 sprays into both nostrils in the morning, at noon, in the evening, and at bedtime. 30 mL 2   tamsulosin (FLOMAX) 0.4 MG CAPS capsule Take 1 capsule (0.4 mg total) by mouth at bedtime. 30 capsule 0   Ubrogepant (UBRELVY) 100 MG TABS Take 1 tablet (100 mg total) by mouth as needed (take 1 tab at the earlist onset of a migraine. May repeat in 2 hours. Max 2 tabs in 24 hours). (Patient not taking: Reported on 09/11/2023) 16 tablet 5   Vitamin D, Ergocalciferol, (DRISDOL) 1.25 MG (50000 UNIT) CAPS capsule TAKE ONE CAPSULE BY MOUTH EVERY 7 DAYS (Patient taking differently: Take 50,000 Units by mouth every Sunday.) 4 capsule 0   zolpidem (AMBIEN) 10 MG tablet Take 1 tablet (10 mg total) by mouth at bedtime. 30 tablet 5   zonisamide (ZONEGRAN) 100 MG capsule Take 3 capsules (300 mg total) by mouth daily. 90 capsule 5   Current Facility-Administered Medications on File Prior to Visit  Medication Dose Route Frequency Provider Last Rate Last Admin    botulinum toxin Type A (BOTOX) injection 200 Units  200 Units Intramuscular Once Emryn Flanery R, DO       lidocaine (XYLOCAINE) 1 % (with pres) injection 6 mL  6 mL Other Once        lidocaine (XYLOCAINE) 1 % (with pres) injection 6 mL  6 mL Other Once        lidocaine (XYLOCAINE) 1 % (with pres) injection 6 mL  6 mL Other Once         Technical Summary:  A multichannel digital EEG recording measured by the international 10-20 system with electrodes applied with paste and impedances below 5000 ohms performed in our laboratory with EKG monitoring in an awake and drowsy patient.  Photic stimulation was performed.  The digital EEG was referentially recorded, reformatted, and digitally filtered in a variety of bipolar and referential montages for optimal display.    Description: The patient is awake and drowsy during the recording.  During maximal wakefulness, there is a symmetric, medium voltage 8-9 Hz posterior dominant rhythm that attenuates with eye opening.  The record is symmetric.  Stage 2 sleep was not seen.  Photic stimulation did not elicit any abnormalities.  There were no epileptiform discharges or electrographic seizures seen.    EKG lead was unremarkable.  Impression: This awake and drowsy EEG is normal.    Clinical Correlation: A normal EEG does not exclude a clinical diagnosis of epilepsy.  If further clinical questions remain, prolonged EEG may be helpful.  Clinical correlation is advised.   Shon Millet, DO

## 2023-10-22 NOTE — Telephone Encounter (Signed)
 EEG is normal.  No further recommendations at this time.

## 2023-10-22 NOTE — Telephone Encounter (Signed)
 Patient advised.

## 2023-10-29 DIAGNOSIS — J3081 Allergic rhinitis due to animal (cat) (dog) hair and dander: Secondary | ICD-10-CM | POA: Diagnosis not present

## 2023-10-29 DIAGNOSIS — J3089 Other allergic rhinitis: Secondary | ICD-10-CM | POA: Diagnosis not present

## 2023-10-29 DIAGNOSIS — J301 Allergic rhinitis due to pollen: Secondary | ICD-10-CM | POA: Diagnosis not present

## 2023-11-05 DIAGNOSIS — J301 Allergic rhinitis due to pollen: Secondary | ICD-10-CM | POA: Diagnosis not present

## 2023-11-05 DIAGNOSIS — J3081 Allergic rhinitis due to animal (cat) (dog) hair and dander: Secondary | ICD-10-CM | POA: Diagnosis not present

## 2023-11-05 DIAGNOSIS — J3089 Other allergic rhinitis: Secondary | ICD-10-CM | POA: Diagnosis not present

## 2023-11-08 ENCOUNTER — Ambulatory Visit: Payer: Medicare HMO | Admitting: Neurology

## 2023-11-08 DIAGNOSIS — G43709 Chronic migraine without aura, not intractable, without status migrainosus: Secondary | ICD-10-CM | POA: Diagnosis not present

## 2023-11-08 MED ORDER — ONABOTULINUMTOXINA 100 UNITS IJ SOLR
200.0000 [IU] | Freq: Once | INTRAMUSCULAR | Status: AC
  Administered 2023-11-08: 155 [IU] via INTRAMUSCULAR

## 2023-11-08 NOTE — Progress Notes (Signed)

## 2023-11-11 ENCOUNTER — Encounter: Payer: Medicare HMO | Admitting: Physical Medicine and Rehabilitation

## 2023-11-12 DIAGNOSIS — J301 Allergic rhinitis due to pollen: Secondary | ICD-10-CM | POA: Diagnosis not present

## 2023-11-12 DIAGNOSIS — J3081 Allergic rhinitis due to animal (cat) (dog) hair and dander: Secondary | ICD-10-CM | POA: Diagnosis not present

## 2023-11-12 DIAGNOSIS — J3089 Other allergic rhinitis: Secondary | ICD-10-CM | POA: Diagnosis not present

## 2023-11-13 ENCOUNTER — Encounter: Payer: Self-pay | Admitting: Physical Medicine and Rehabilitation

## 2023-11-13 ENCOUNTER — Encounter: Attending: Physical Medicine and Rehabilitation | Admitting: Physical Medicine and Rehabilitation

## 2023-11-13 VITALS — BP 129/84 | HR 90 | Ht 61.0 in | Wt 238.4 lb

## 2023-11-13 DIAGNOSIS — G894 Chronic pain syndrome: Secondary | ICD-10-CM

## 2023-11-13 DIAGNOSIS — M797 Fibromyalgia: Secondary | ICD-10-CM | POA: Insufficient documentation

## 2023-11-13 DIAGNOSIS — M7918 Myalgia, other site: Secondary | ICD-10-CM | POA: Diagnosis not present

## 2023-11-13 DIAGNOSIS — R2689 Other abnormalities of gait and mobility: Secondary | ICD-10-CM

## 2023-11-13 DIAGNOSIS — R296 Repeated falls: Secondary | ICD-10-CM

## 2023-11-13 MED ORDER — ONDANSETRON HCL 4 MG PO TABS: 4.0000 mg | ORAL_TABLET | Freq: Three times a day (TID) | ORAL | 5 refills | Status: DC | PRN

## 2023-11-13 MED ORDER — LIDOCAINE HCL 1 % IJ SOLN
9.0000 mL | Freq: Once | INTRAMUSCULAR | Status: AC
  Administered 2023-11-13: 9 mL

## 2023-11-13 NOTE — Progress Notes (Signed)
 Pt is a 63 yr old female with hx of fibromyalgia- dx'd at age 48, DM2,- Ac1 6.2;  asthma; HTN, OSA, BMI is 50; Nodule in L superior cerebellum. Also has migraines- intractable and daily; has trace aura;  No kidney issues- Cr 0.65 and BUN 15 in 6/22. Also end stage OA of B/L knees medially and R hip pain. Also has myofascial pain syndrome.  Has new brain mass that was documented Worsening cerebellar Sx's. So had crani for cerebellar tumor removal late March 2023-  Here for f/u on Fibromyalgia and Myofascial pain syndrome and TrP injections  Deviated septum surgery- 09/16/23-  Healing OK- not healed yet, but getting better.   Pain levels - back is killing her.  Missed appt in Late January/early February due to surgery.   Had bad night due to pain- woke her up- her legs mainly- but also back.   Got botox last week-for migraines- starting to kick in.  Doesn't completely take migraines away.  Takes Amerge when has migraine- Naratriptan- migraines usually hits at night- and will help her relax and go to sleep.   Still falling- EEG was negative  When gets hair done, a trigger is touching her neck- esp on L side near surgical site from cerebellar tumor.  Still falling - last fall almost 2 months ago 2x/in 1 week.   Not using Rolator in house      Plan: Will fill Zofran 4 mg up to 3x/day- increase to 60 tabs with 5 refills   2. Patient here for trigger point injections for  Consent done and on chart.  Cleaned areas with alcohol and injected using a 27 gauge 1.5 inch needle  Injected 9cc- none wasted Using 1% Lidocaine with no EPI  Upper traps B/L  Levators- B/L  Posterior scalenes Middle scalenes- B/L  Splenius Capitus- B/L  Pectoralis Major- B/L  Rhomboids B/L x2 Infraspinatus Teres Major/minor Thoracic paraspinals Lumbar paraspinals- b/L x2 Other injections- B/L hands, deltoids/triceps and B/L hamstrings x2   Patient's level of pain prior was- 10/10-  Current level of pain  after injections is- already improving- down to 8/10 already  There was no bleeding or complications.  Patient was advised to drink a lot of water on day after injections to flush system Will have increased soreness for 12-48 hours after injections.  Can use Lidocaine patches the day AFTER injections Can use theracane on day of injections in places didn't inject Can use heating pad 4-6 hours AFTER injections  3. F/U q6 weeks- for Ttrp injection and f/u on FMS  4. Have to use Rolator at all times. Discussed that has chance of breaking hip or ankle, etc- so needs to use at al times.   5. Cerebellar tumor, even after removal- is a risk factor for falls.  Last MRI of brain with contrast- was negative for regrowth.    6. We discussed pt's goal is not prior to tumor- it's to be the best at this level of function.     I spent a total of 28   minutes on total care today- >50% coordination of care- due to d/w pt about fall risk and need for Rolator- even in home- and 6 minutes on injections-

## 2023-11-13 NOTE — Patient Instructions (Addendum)
 Plan: Will fill Zofran 4 mg up to 3x/day- increase to 60 tabs with 5 refills   2. Patient here for trigger point injections for  Consent done and on chart.  Cleaned areas with alcohol and injected using a 27 gauge 1.5 inch needle  Injected 9cc- none wasted Using 1% Lidocaine with no EPI  Upper traps B/L  Levators- B/L  Posterior scalenes Middle scalenes- B/L  Splenius Capitus- B/L  Pectoralis Major- B/L  Rhomboids B/L x2 Infraspinatus Teres Major/minor Thoracic paraspinals Lumbar paraspinals- b/L x2 Other injections- B/L hands, deltoids/triceps and B/L hamstrings x2   Patient's level of pain prior was- 10/10-  Current level of pain after injections is- already improving- down to 8/10 already  There was no bleeding or complications.  Patient was advised to drink a lot of water on day after injections to flush system Will have increased soreness for 12-48 hours after injections.  Can use Lidocaine patches the day AFTER injections Can use theracane on day of injections in places didn't inject Can use heating pad 4-6 hours AFTER injections  3. F/U q6 weeks- for Ttrp injection and f/u on FMS  4. Have to use Rolator at all times. Discussed that has chance of breaking hip or ankle, etc- so needs to use at al times.   5. Cerebellar tumor, even after removal- is a risk factor for falls.  Last MRI of brain with contrast- was negative for regrowth.   6. We discussed pt's goal is not prior to tumor- it's to be the best at this level of function.

## 2023-11-15 DIAGNOSIS — E66813 Obesity, class 3: Secondary | ICD-10-CM | POA: Diagnosis not present

## 2023-11-15 DIAGNOSIS — R1110 Vomiting, unspecified: Secondary | ICD-10-CM | POA: Diagnosis not present

## 2023-11-15 DIAGNOSIS — F4321 Adjustment disorder with depressed mood: Secondary | ICD-10-CM | POA: Diagnosis not present

## 2023-11-15 DIAGNOSIS — G894 Chronic pain syndrome: Secondary | ICD-10-CM | POA: Diagnosis not present

## 2023-11-15 DIAGNOSIS — Z79899 Other long term (current) drug therapy: Secondary | ICD-10-CM | POA: Diagnosis not present

## 2023-11-15 DIAGNOSIS — Z86018 Personal history of other benign neoplasm: Secondary | ICD-10-CM | POA: Diagnosis not present

## 2023-11-15 DIAGNOSIS — Z6841 Body Mass Index (BMI) 40.0 and over, adult: Secondary | ICD-10-CM | POA: Diagnosis not present

## 2023-11-19 DIAGNOSIS — J3089 Other allergic rhinitis: Secondary | ICD-10-CM | POA: Diagnosis not present

## 2023-11-19 DIAGNOSIS — Z9889 Other specified postprocedural states: Secondary | ICD-10-CM | POA: Diagnosis not present

## 2023-11-19 DIAGNOSIS — J3081 Allergic rhinitis due to animal (cat) (dog) hair and dander: Secondary | ICD-10-CM | POA: Diagnosis not present

## 2023-11-19 DIAGNOSIS — J301 Allergic rhinitis due to pollen: Secondary | ICD-10-CM | POA: Diagnosis not present

## 2023-11-19 DIAGNOSIS — J342 Deviated nasal septum: Secondary | ICD-10-CM | POA: Diagnosis not present

## 2023-11-19 DIAGNOSIS — J343 Hypertrophy of nasal turbinates: Secondary | ICD-10-CM | POA: Diagnosis not present

## 2023-11-20 DIAGNOSIS — J3089 Other allergic rhinitis: Secondary | ICD-10-CM | POA: Diagnosis not present

## 2023-11-20 DIAGNOSIS — J3081 Allergic rhinitis due to animal (cat) (dog) hair and dander: Secondary | ICD-10-CM | POA: Diagnosis not present

## 2023-11-20 DIAGNOSIS — J301 Allergic rhinitis due to pollen: Secondary | ICD-10-CM | POA: Diagnosis not present

## 2023-11-25 ENCOUNTER — Telehealth: Payer: Self-pay

## 2023-11-25 MED ORDER — ZONISAMIDE 100 MG PO CAPS: 300.0000 mg | ORAL_CAPSULE | Freq: Every day | ORAL | 5 refills | Status: DC

## 2023-11-25 NOTE — Telephone Encounter (Signed)
 Refill sent.

## 2023-11-26 DIAGNOSIS — J301 Allergic rhinitis due to pollen: Secondary | ICD-10-CM | POA: Diagnosis not present

## 2023-11-26 DIAGNOSIS — J3081 Allergic rhinitis due to animal (cat) (dog) hair and dander: Secondary | ICD-10-CM | POA: Diagnosis not present

## 2023-11-26 DIAGNOSIS — J3089 Other allergic rhinitis: Secondary | ICD-10-CM | POA: Diagnosis not present

## 2023-11-28 DIAGNOSIS — E119 Type 2 diabetes mellitus without complications: Secondary | ICD-10-CM | POA: Diagnosis not present

## 2023-11-28 DIAGNOSIS — E7800 Pure hypercholesterolemia, unspecified: Secondary | ICD-10-CM | POA: Diagnosis not present

## 2023-11-28 DIAGNOSIS — G894 Chronic pain syndrome: Secondary | ICD-10-CM | POA: Diagnosis not present

## 2023-11-28 DIAGNOSIS — G43909 Migraine, unspecified, not intractable, without status migrainosus: Secondary | ICD-10-CM | POA: Diagnosis not present

## 2023-11-28 DIAGNOSIS — M797 Fibromyalgia: Secondary | ICD-10-CM | POA: Diagnosis not present

## 2023-11-28 DIAGNOSIS — M170 Bilateral primary osteoarthritis of knee: Secondary | ICD-10-CM | POA: Diagnosis not present

## 2023-11-28 DIAGNOSIS — M7061 Trochanteric bursitis, right hip: Secondary | ICD-10-CM | POA: Diagnosis not present

## 2023-11-28 DIAGNOSIS — F5101 Primary insomnia: Secondary | ICD-10-CM | POA: Diagnosis not present

## 2023-11-28 DIAGNOSIS — E1159 Type 2 diabetes mellitus with other circulatory complications: Secondary | ICD-10-CM | POA: Diagnosis not present

## 2023-12-03 DIAGNOSIS — J3081 Allergic rhinitis due to animal (cat) (dog) hair and dander: Secondary | ICD-10-CM | POA: Diagnosis not present

## 2023-12-03 DIAGNOSIS — J3089 Other allergic rhinitis: Secondary | ICD-10-CM | POA: Diagnosis not present

## 2023-12-03 DIAGNOSIS — J301 Allergic rhinitis due to pollen: Secondary | ICD-10-CM | POA: Diagnosis not present

## 2023-12-04 ENCOUNTER — Other Ambulatory Visit: Payer: Self-pay | Admitting: Psychiatry

## 2023-12-04 DIAGNOSIS — F5105 Insomnia due to other mental disorder: Secondary | ICD-10-CM

## 2023-12-04 DIAGNOSIS — F419 Anxiety disorder, unspecified: Secondary | ICD-10-CM

## 2023-12-10 DIAGNOSIS — J3081 Allergic rhinitis due to animal (cat) (dog) hair and dander: Secondary | ICD-10-CM | POA: Diagnosis not present

## 2023-12-10 DIAGNOSIS — J301 Allergic rhinitis due to pollen: Secondary | ICD-10-CM | POA: Diagnosis not present

## 2023-12-10 DIAGNOSIS — J3089 Other allergic rhinitis: Secondary | ICD-10-CM | POA: Diagnosis not present

## 2023-12-17 DIAGNOSIS — J3081 Allergic rhinitis due to animal (cat) (dog) hair and dander: Secondary | ICD-10-CM | POA: Diagnosis not present

## 2023-12-17 DIAGNOSIS — J3089 Other allergic rhinitis: Secondary | ICD-10-CM | POA: Diagnosis not present

## 2023-12-17 DIAGNOSIS — J301 Allergic rhinitis due to pollen: Secondary | ICD-10-CM | POA: Diagnosis not present

## 2023-12-23 ENCOUNTER — Encounter: Payer: Medicare HMO | Attending: Physical Medicine and Rehabilitation | Admitting: Physical Medicine and Rehabilitation

## 2023-12-23 ENCOUNTER — Encounter: Payer: Self-pay | Admitting: Physical Medicine and Rehabilitation

## 2023-12-23 VITALS — BP 129/81 | HR 83 | Ht 61.0 in | Wt 241.0 lb

## 2023-12-23 DIAGNOSIS — M7918 Myalgia, other site: Secondary | ICD-10-CM | POA: Insufficient documentation

## 2023-12-23 DIAGNOSIS — G894 Chronic pain syndrome: Secondary | ICD-10-CM | POA: Insufficient documentation

## 2023-12-23 DIAGNOSIS — G43E19 Chronic migraine with aura, intractable, without status migrainosus: Secondary | ICD-10-CM | POA: Diagnosis not present

## 2023-12-23 DIAGNOSIS — G9389 Other specified disorders of brain: Secondary | ICD-10-CM | POA: Insufficient documentation

## 2023-12-23 MED ORDER — LIDOCAINE HCL 1 % IJ SOLN
9.0000 mL | Freq: Once | INTRAMUSCULAR | Status: AC
  Administered 2023-12-23: 9 mL

## 2023-12-23 NOTE — Progress Notes (Signed)
  Pt is a 63 yr old female with hx of fibromyalgia- dx'd at age 30, DM2,- Ac1 6.2;  asthma; HTN, OSA, BMI is 50; Nodule in L superior cerebellum. Also has migraines- intractable and daily; has trace aura;  No kidney issues- Cr 0.65 and BUN 15 in 6/22. Also end stage OA of B/L knees medially and R hip pain. Also has myofascial pain syndrome.  Has new brain mass that was documented Worsening cerebellar Sx's. So had crani for cerebellar tumor removal late March 2023-  Here for f/u on Fibromyalgia and Myofascial pain syndrome and TrP injections   Deviated septum surgery- 09/16/23-  Healing OK- not healed yet, but getting better.        Her allergy doctor- Dr Chanetta Comes died- had cancer.    Doesn't take change well.    Went to see a big play-  [place] to [place] play-  had some more pain from going- on bus.  Didn't have to drive  Stopped q2 hours to get off and relax  Was worth going.   Nausea comes and goes-  Taking Zofran - not more than 1x/day right now If gets nauseated, doesn't eat a lot.   Has had a fall lately- in April- 2 weeks ago-  were using the Rolator.  Random- cannot figure out when it occurs.  Doesn't know how it occurs- cannot stop fall- if can get somewhere to sit, she's OK, but was getting clothes and fell-  Thinks passes out? Not sure- it's "quick" Recovery is quick- doesn't come out of it confused.   Does have vertigo- and takes meclizine - wondering if has to do with that.   Needs to take BP right after- hasn't yet. So not sure if it drops.  Cannot turn head too quick because sets off vertigo.   Takes Meclizine  2x/day- for vertigo.   Doesn't want to take a medicine that makes her high- doesn't know the name of the medicine.  Doesn't want to feel loopy/high.   Dr Lajuana Pilar PCP to talk to pt about this medicine soon.    Septum surgery has healed.  Finally has had a runny nose!- which is new.     Plan: Patient here for trigger point injections for  Consent done  and on chart.  Cleaned areas with alcohol and injected using a 27 gauge 1.5 inch needle  Injected 9cc- none wasted Using 1% Lidocaine  with no EPI  Upper traps B/L  Levators- b/L  Posterior scalenes Middle scalenes- B/L  Splenius Capitus- R only Pectoralis Major B/L  Rhomboids- b/L x3 Infraspinatus Teres Major/minor Thoracic paraspinals- B/L x2 Lumbar paraspinals- B/L x2 Other injections- forearms B/L extensor aspect   Patient's level of pain prior was Current level of pain after injections is  There was no bleeding or complications.  Patient was advised to drink a lot of water on day after injections to flush system Will have increased soreness for 12-48 hours after injections.  Can use Lidocaine  patches the day AFTER injections Can use theracane on day of injections in places didn't inject Can use heating pad 4-6 hours AFTER injections  2. Con't Zofran  for nausea - last refill 11/13/23   3. Con't Trileptal  Oxcerbazepine-  900 mg nightly- last refill 08/28/23- doesn't need refills  4. F/U q6 weeks-  Trp injections and f/u on chronic pain.   5. Discussed her fall and causes at length- and how to try to determine causes- Check BP and monitor vertigo

## 2023-12-23 NOTE — Patient Instructions (Signed)
 Plan: Patient here for trigger point injections for  Consent done and on chart.  Cleaned areas with alcohol and injected using a 27 gauge 1.5 inch needle  Injected 9cc- none wasted Using 1% Lidocaine  with no EPI  Upper traps B/L  Levators- b/L  Posterior scalenes Middle scalenes- B/L  Splenius Capitus- R only Pectoralis Major B/L  Rhomboids- b/L x3 Infraspinatus Teres Major/minor Thoracic paraspinals- B/L x2 Lumbar paraspinals- B/L x2 Other injections- forearms B/L extensor aspect   Patient's level of pain prior was Current level of pain after injections is  There was no bleeding or complications.  Patient was advised to drink a lot of water on day after injections to flush system Will have increased soreness for 12-48 hours after injections.  Can use Lidocaine  patches the day AFTER injections Can use theracane on day of injections in places didn't inject Can use heating pad 4-6 hours AFTER injections  2. Con't Zofran  for nausea - last refill 11/13/23   3. Con't Trileptal  Oxcerbazepine-  900 mg nightly- last refill 08/28/23- doesn't need refills  4. F/U q6 weeks-  Trp injections and f/u on chronic pain.

## 2023-12-24 DIAGNOSIS — J3089 Other allergic rhinitis: Secondary | ICD-10-CM | POA: Diagnosis not present

## 2023-12-24 DIAGNOSIS — J3081 Allergic rhinitis due to animal (cat) (dog) hair and dander: Secondary | ICD-10-CM | POA: Diagnosis not present

## 2023-12-24 DIAGNOSIS — J301 Allergic rhinitis due to pollen: Secondary | ICD-10-CM | POA: Diagnosis not present

## 2023-12-28 ENCOUNTER — Encounter: Payer: Self-pay | Admitting: Neurology

## 2023-12-31 DIAGNOSIS — G894 Chronic pain syndrome: Secondary | ICD-10-CM | POA: Diagnosis not present

## 2023-12-31 DIAGNOSIS — E538 Deficiency of other specified B group vitamins: Secondary | ICD-10-CM | POA: Diagnosis not present

## 2023-12-31 DIAGNOSIS — F5101 Primary insomnia: Secondary | ICD-10-CM | POA: Diagnosis not present

## 2023-12-31 DIAGNOSIS — E119 Type 2 diabetes mellitus without complications: Secondary | ICD-10-CM | POA: Diagnosis not present

## 2023-12-31 DIAGNOSIS — J3081 Allergic rhinitis due to animal (cat) (dog) hair and dander: Secondary | ICD-10-CM | POA: Diagnosis not present

## 2023-12-31 DIAGNOSIS — J3089 Other allergic rhinitis: Secondary | ICD-10-CM | POA: Diagnosis not present

## 2023-12-31 DIAGNOSIS — E1159 Type 2 diabetes mellitus with other circulatory complications: Secondary | ICD-10-CM | POA: Diagnosis not present

## 2023-12-31 DIAGNOSIS — E66813 Obesity, class 3: Secondary | ICD-10-CM | POA: Diagnosis not present

## 2023-12-31 DIAGNOSIS — J301 Allergic rhinitis due to pollen: Secondary | ICD-10-CM | POA: Diagnosis not present

## 2023-12-31 DIAGNOSIS — K5909 Other constipation: Secondary | ICD-10-CM | POA: Diagnosis not present

## 2023-12-31 DIAGNOSIS — Z6841 Body Mass Index (BMI) 40.0 and over, adult: Secondary | ICD-10-CM | POA: Diagnosis not present

## 2024-01-06 ENCOUNTER — Ambulatory Visit: Payer: Medicare HMO

## 2024-01-07 ENCOUNTER — Ambulatory Visit
Admission: RE | Admit: 2024-01-07 | Discharge: 2024-01-07 | Disposition: A | Discharge status: Home / Self Care | Payer: Medicare HMO | Source: Ambulatory Visit | Attending: Family Medicine | Admitting: Family Medicine

## 2024-01-07 DIAGNOSIS — Z1231 Encounter for screening mammogram for malignant neoplasm of breast: Secondary | ICD-10-CM

## 2024-01-07 DIAGNOSIS — J301 Allergic rhinitis due to pollen: Secondary | ICD-10-CM | POA: Diagnosis not present

## 2024-01-07 DIAGNOSIS — J3081 Allergic rhinitis due to animal (cat) (dog) hair and dander: Secondary | ICD-10-CM | POA: Diagnosis not present

## 2024-01-07 DIAGNOSIS — J3089 Other allergic rhinitis: Secondary | ICD-10-CM | POA: Diagnosis not present

## 2024-01-14 DIAGNOSIS — J3081 Allergic rhinitis due to animal (cat) (dog) hair and dander: Secondary | ICD-10-CM | POA: Diagnosis not present

## 2024-01-14 DIAGNOSIS — J301 Allergic rhinitis due to pollen: Secondary | ICD-10-CM | POA: Diagnosis not present

## 2024-01-14 DIAGNOSIS — J3089 Other allergic rhinitis: Secondary | ICD-10-CM | POA: Diagnosis not present

## 2024-01-23 ENCOUNTER — Telehealth: Payer: Self-pay

## 2024-01-23 DIAGNOSIS — J301 Allergic rhinitis due to pollen: Secondary | ICD-10-CM | POA: Diagnosis not present

## 2024-01-23 DIAGNOSIS — J3089 Other allergic rhinitis: Secondary | ICD-10-CM | POA: Diagnosis not present

## 2024-01-23 DIAGNOSIS — J3081 Allergic rhinitis due to animal (cat) (dog) hair and dander: Secondary | ICD-10-CM | POA: Diagnosis not present

## 2024-01-23 NOTE — Telephone Encounter (Signed)
 PA needed for Botox

## 2024-01-24 ENCOUNTER — Encounter: Attending: Physical Medicine and Rehabilitation | Admitting: Physical Medicine and Rehabilitation

## 2024-01-24 ENCOUNTER — Encounter: Payer: Self-pay | Admitting: Physical Medicine and Rehabilitation

## 2024-01-24 VITALS — BP 122/80 | HR 87 | Ht 61.0 in | Wt 240.8 lb

## 2024-01-24 DIAGNOSIS — R2689 Other abnormalities of gait and mobility: Secondary | ICD-10-CM | POA: Insufficient documentation

## 2024-01-24 DIAGNOSIS — G9389 Other specified disorders of brain: Secondary | ICD-10-CM | POA: Diagnosis not present

## 2024-01-24 DIAGNOSIS — M7918 Myalgia, other site: Secondary | ICD-10-CM | POA: Insufficient documentation

## 2024-01-24 DIAGNOSIS — G894 Chronic pain syndrome: Secondary | ICD-10-CM | POA: Insufficient documentation

## 2024-01-24 DIAGNOSIS — R251 Tremor, unspecified: Secondary | ICD-10-CM | POA: Insufficient documentation

## 2024-01-24 MED ORDER — OXCARBAZEPINE 300 MG PO TABS: 900.0000 mg | ORAL_TABLET | Freq: Every evening | ORAL | 1 refills | Status: DC

## 2024-01-24 MED ORDER — LIDOCAINE HCL 1 % IJ SOLN
6.0000 mL | Freq: Once | INTRAMUSCULAR | Status: AC
  Administered 2024-01-24: 6 mL

## 2024-01-24 NOTE — Progress Notes (Signed)
 Pt is a 63 yr old female with hx of fibromyalgia- dx'd at age 28, DM2,- Ac1 6.2;  asthma; HTN, OSA, BMI is 50; Nodule in L superior cerebellum. Also has migraines- intractable and daily; has trace aura;  No kidney issues- Cr 0.65 and BUN 15 in 6/22. Also end stage OA of B/L knees medially and R hip pain. Also has myofascial pain syndrome.  Has new brain mass that was documented Worsening cerebellar Sx's. So had crani for cerebellar tumor removal late March 2023-  Here for f/u on Fibromyalgia and Myofascial pain syndrome and TrP injections   Came today- not at appointment.    Asking if how needs to put in [facility]- came out after 3 days.     Today is "fine"-  Wants injections today.   Sometimes/frequently has tremors- B/L arms- and phone still drops.  Also still has   Doing glucose/work up and heart monitor-  to see if that's the cause of falling.   Has not checked Orthostatic vital signs yet. Did a long time ago, but not lately.    Having a lot of headaches lately-   Wants gel injections for knees- waiting to hear if approved   Plan: Explained tremors are due to cerebellar tumor- she had- does have damage to her brain. SO we at least know why dropping things/and tremoring.    2.  Need to get Orthostatic blood pressures- laying down and sitting after 5 minutes or so and then standing after 5-10 minutes.  To see if it changes.   3. Needs refills of Trileptal  900 mg nightly for nerve pain- sent in for 3 months supply 1 refill.    4. Patient here for trigger point injections for  Consent done and on chart.  Cleaned areas with alcohol and injected using a 27 gauge 1.5 inch needle  Injected 6cc- none wasted Using 1% Lidocaine  with no EPI  Upper traps B/L  x2 Levators- B/L  Posterior scalenes Middle scalenes- B/L - x2 on L Splenius Capitus- B/L  (no swleling on l side of neck) Pectoralis Major B/L  Rhomboids b/L x3 Infraspinatus Teres Major/minor Thoracic  paraspinals- B/L x2 Lumbar paraspinals- B/L x2 Other injections-    Patient's level of pain prior was  9.5/10- is manageable Current level of pain after injections is little better so far  There was no bleeding or complications.  Patient was advised to drink a lot of water on day after injections to flush system Will have increased soreness for 12-48 hours after injections.  Can use Lidocaine  patches the day AFTER injections Can use theracane on day of injections in places didn't inject Can use heating pad 4-6 hours AFTER injections  5. F/U in 2 months- f/u on chronic pain/ Trp injections-    I spent a total of  26  minutes on total care today- >50% coordination of care- due to 6 minutes on injections- rest d/w pt about tremors, and falls- getting work up to determine what's going on

## 2024-01-24 NOTE — Patient Instructions (Signed)
 Plan: Explained tremors are due to cerebellar tumor- she had- does have damage to her brain. SO we at least know why dropping things/and tremoring.    2.  Need to get Orthostatic blood pressures- laying down and sitting after 5 minutes or so and then standing after 5-10 minutes.  To see if it changes.   3. Needs refills of Trileptal  900 mg nightly for nerve pain- sent in for 3 months supply 1 refill.    4. Patient here for trigger point injections for  Consent done and on chart.  Cleaned areas with alcohol and injected using a 27 gauge 1.5 inch needle  Injected 6cc- none wasted Using 1% Lidocaine  with no EPI  Upper traps B/L  x2 Levators- B/L  Posterior scalenes Middle scalenes- B/L - x2 on L Splenius Capitus- B/L  (no swleling on l side of neck) Pectoralis Major B/L  Rhomboids b/L x3 Infraspinatus Teres Major/minor Thoracic paraspinals- B/L x2 Lumbar paraspinals- B/L x2 Other injections-    Patient's level of pain prior was  9.5/10- is manageable Current level of pain after injections is little better so far  There was no bleeding or complications.  Patient was advised to drink a lot of water on day after injections to flush system Will have increased soreness for 12-48 hours after injections.  Can use Lidocaine  patches the day AFTER injections Can use theracane on day of injections in places didn't inject Can use heating pad 4-6 hours AFTER injections  5. F/U in 2 months- f/u on chronic pain/ Trp injections-

## 2024-01-24 NOTE — Addendum Note (Signed)
 Addended by: Rosetta Rupnow on: 01/24/2024 10:52 AM   Modules accepted: Orders

## 2024-01-27 ENCOUNTER — Other Ambulatory Visit (HOSPITAL_COMMUNITY): Payer: Self-pay

## 2024-01-27 ENCOUNTER — Ambulatory Visit: Payer: Medicare HMO | Admitting: Family Medicine

## 2024-01-27 ENCOUNTER — Encounter: Admitting: Physical Medicine and Rehabilitation

## 2024-01-27 ENCOUNTER — Telehealth: Payer: Self-pay | Admitting: Pharmacy Technician

## 2024-01-27 DIAGNOSIS — G43709 Chronic migraine without aura, not intractable, without status migrainosus: Secondary | ICD-10-CM

## 2024-01-27 NOTE — Telephone Encounter (Signed)
 PA has been submitted, and telephone encounter has been created. Please see telephone encounter dated 6.9.25.

## 2024-01-27 NOTE — Telephone Encounter (Signed)
 Pharmacy Patient Advocate Encounter   [facility] Portal verification has been Submitted Benefit Verification #:  [id number]   Primary Insurance: [employer] Dx Code: G49.709 J-code: Botox - T7322 Procedure code: 02542  Pharmacy Patient Advocate Encounter  Pharmacy Benefit PA has been submitted for Botox - J0585 via CoverMyMeds.  INSURANCE: HU[employer]NA KEY/EOC/FAXTressia Fry Procedure code 70623  Pending [facility] report require a PA Status is Pending

## 2024-01-28 ENCOUNTER — Other Ambulatory Visit (HOSPITAL_COMMUNITY): Payer: Self-pay

## 2024-01-28 ENCOUNTER — Other Ambulatory Visit: Payer: Self-pay

## 2024-01-28 ENCOUNTER — Ambulatory Visit: Attending: Family Medicine

## 2024-01-28 ENCOUNTER — Other Ambulatory Visit: Payer: Self-pay | Admitting: *Deleted

## 2024-01-28 DIAGNOSIS — R55 Syncope and collapse: Secondary | ICD-10-CM

## 2024-01-28 DIAGNOSIS — J3081 Allergic rhinitis due to animal (cat) (dog) hair and dander: Secondary | ICD-10-CM | POA: Diagnosis not present

## 2024-01-28 DIAGNOSIS — J301 Allergic rhinitis due to pollen: Secondary | ICD-10-CM | POA: Diagnosis not present

## 2024-01-28 DIAGNOSIS — R002 Palpitations: Secondary | ICD-10-CM

## 2024-01-28 DIAGNOSIS — J3089 Other allergic rhinitis: Secondary | ICD-10-CM | POA: Diagnosis not present

## 2024-01-28 MED ORDER — ONABOTULINUMTOXINA 200 UNITS IJ SOLR
INTRAMUSCULAR | 4 refills | Status: DC
  Filled 2024-01-28 – 2024-02-20 (×2): qty 1, 90d supply, fill #0

## 2024-01-28 NOTE — Progress Notes (Unsigned)
 Enrolled for [employer] to mail a ZIO XT long term holter monitor to the patients address on file.   EP to read

## 2024-01-28 NOTE — Progress Notes (Signed)
 Patient to be enrolled with [facility]. Routed to Rx Prior Auth Team (ATTN: [place]).

## 2024-01-28 NOTE — Telephone Encounter (Signed)
 Pharmacy Patient Advocate Encounter- Injection via Pharmacy Benefit:  PA was submitted  for Botox - J0585 to [employer] and has been approved through: 1.1.25 - 12.31.25 Authorization# [id number]  Please send prescription to Specialty Pharmacy: [facility]: [phone]  Estimated Pharmacy Copay is: [phone]  Patient IS NOT eligible for Botox - J0585 Copay Card, which will make patient's copay as little as zero. Copay card will be provided to pharmacy.   Admin Code: 69629   Pending [facility] report require Prior Auth. Patient's remaining deductible: ? Patient estimated coinsurance and/or copay for procedure code: ?

## 2024-01-30 ENCOUNTER — Other Ambulatory Visit (HOSPITAL_COMMUNITY): Payer: Self-pay

## 2024-01-31 DIAGNOSIS — E119 Type 2 diabetes mellitus without complications: Secondary | ICD-10-CM | POA: Diagnosis not present

## 2024-01-31 DIAGNOSIS — E66813 Obesity, class 3: Secondary | ICD-10-CM | POA: Diagnosis not present

## 2024-01-31 DIAGNOSIS — Z6841 Body Mass Index (BMI) 40.0 and over, adult: Secondary | ICD-10-CM | POA: Diagnosis not present

## 2024-01-31 DIAGNOSIS — K5909 Other constipation: Secondary | ICD-10-CM | POA: Diagnosis not present

## 2024-01-31 DIAGNOSIS — E1159 Type 2 diabetes mellitus with other circulatory complications: Secondary | ICD-10-CM | POA: Diagnosis not present

## 2024-01-31 DIAGNOSIS — R55 Syncope and collapse: Secondary | ICD-10-CM | POA: Diagnosis not present

## 2024-01-31 DIAGNOSIS — Z86018 Personal history of other benign neoplasm: Secondary | ICD-10-CM | POA: Diagnosis not present

## 2024-01-31 NOTE — Telephone Encounter (Signed)
 Botox  [facility]- No PA req for [id number]

## 2024-02-03 ENCOUNTER — Ambulatory Visit (INDEPENDENT_AMBULATORY_CARE_PROVIDER_SITE_OTHER)

## 2024-02-03 ENCOUNTER — Other Ambulatory Visit (HOSPITAL_COMMUNITY): Payer: Self-pay

## 2024-02-03 DIAGNOSIS — G43709 Chronic migraine without aura, not intractable, without status migrainosus: Secondary | ICD-10-CM | POA: Diagnosis not present

## 2024-02-03 MED ORDER — KETOROLAC TROMETHAMINE 60 MG/2ML IM SOLN
60.0000 mg | Freq: Once | INTRAMUSCULAR | Status: AC
  Administered 2024-02-03: 60 mg via INTRAMUSCULAR

## 2024-02-03 MED ORDER — UBRELVY 100 MG PO TABS: 100.0000 mg | ORAL_TABLET | ORAL | 5 refills | Status: DC | PRN

## 2024-02-03 MED ORDER — METOCLOPRAMIDE HCL 5 MG/ML IJ SOLN
10.0000 mg | Freq: Once | INTRAMUSCULAR | Status: AC
  Administered 2024-02-03: 10 mg via INTRAMUSCULAR

## 2024-02-03 MED ORDER — DIPHENHYDRAMINE HCL 50 MG/ML IJ SOLN
50.0000 mg | Freq: Once | INTRAMUSCULAR | Status: AC
  Administered 2024-02-03: 50 mg via INTRAMUSCULAR

## 2024-02-03 NOTE — Telephone Encounter (Signed)
 Patient advised of Dr.Jaffe note,  I'm assuming she tried both Ubrelvy  and naratriptan ?  If she has a driver, she may come in for a headache cocktail.  Otherwise, we can send a prednisone taper but she would have to monitor her sugar.  Per Patient she did not know she could use the Ubrelvy  and the Naratriptan  at the same time.  Patient will be here at 3 pm for the headache cocktail       As for an alternative to Botox , would she be open to an IV infusion given every 3 months called Vyepti.  It is very well tolerated.  Otherwise, we can discuss at a follow up visit.

## 2024-02-03 NOTE — Progress Notes (Signed)
 Spoke to patient- she is unable to afford $546.52 copay. Sent message to office to advise.

## 2024-02-03 NOTE — Progress Notes (Signed)
 Patient had to call [facility] pharmacy with me in the room to check on what medication she has for her migraines.    Per rep patient has Naratriptan , and Zofran  she picked up on last week. Ubrelvy  they can not get in stock.   Will need to be called into another pharmacy.    Ubrelvy  called into [facility].

## 2024-02-03 NOTE — Telephone Encounter (Signed)
 Spoke to patient, she advised she is unable to afford $546.52 copay through her pharmacy benefit. Advised that pharmacy is unable to ship medication without payment.   Copay would likely be the same through the medical benefit, patient responsible for 20% coinsurance.  Patient advises she is dealing with a bad migraine. Please advise.

## 2024-02-05 DIAGNOSIS — J3081 Allergic rhinitis due to animal (cat) (dog) hair and dander: Secondary | ICD-10-CM | POA: Diagnosis not present

## 2024-02-05 DIAGNOSIS — E1169 Type 2 diabetes mellitus with other specified complication: Secondary | ICD-10-CM | POA: Diagnosis not present

## 2024-02-05 DIAGNOSIS — J301 Allergic rhinitis due to pollen: Secondary | ICD-10-CM | POA: Diagnosis not present

## 2024-02-05 DIAGNOSIS — E119 Type 2 diabetes mellitus without complications: Secondary | ICD-10-CM | POA: Diagnosis not present

## 2024-02-05 DIAGNOSIS — I10 Essential (primary) hypertension: Secondary | ICD-10-CM | POA: Diagnosis not present

## 2024-02-05 DIAGNOSIS — J3089 Other allergic rhinitis: Secondary | ICD-10-CM | POA: Diagnosis not present

## 2024-02-05 NOTE — Progress Notes (Signed)
 Patient is unable to afford and MD switching therapy

## 2024-02-07 ENCOUNTER — Ambulatory Visit: Admitting: Neurology

## 2024-02-11 DIAGNOSIS — J3089 Other allergic rhinitis: Secondary | ICD-10-CM | POA: Diagnosis not present

## 2024-02-11 DIAGNOSIS — J301 Allergic rhinitis due to pollen: Secondary | ICD-10-CM | POA: Diagnosis not present

## 2024-02-11 DIAGNOSIS — J3081 Allergic rhinitis due to animal (cat) (dog) hair and dander: Secondary | ICD-10-CM | POA: Diagnosis not present

## 2024-02-17 DIAGNOSIS — E1169 Type 2 diabetes mellitus with other specified complication: Secondary | ICD-10-CM | POA: Diagnosis not present

## 2024-02-17 DIAGNOSIS — I10 Essential (primary) hypertension: Secondary | ICD-10-CM | POA: Diagnosis not present

## 2024-02-17 DIAGNOSIS — F4323 Adjustment disorder with mixed anxiety and depressed mood: Secondary | ICD-10-CM | POA: Diagnosis not present

## 2024-02-19 DIAGNOSIS — J301 Allergic rhinitis due to pollen: Secondary | ICD-10-CM | POA: Diagnosis not present

## 2024-02-19 DIAGNOSIS — J3081 Allergic rhinitis due to animal (cat) (dog) hair and dander: Secondary | ICD-10-CM | POA: Diagnosis not present

## 2024-02-19 DIAGNOSIS — J4540 Moderate persistent asthma, uncomplicated: Secondary | ICD-10-CM | POA: Diagnosis not present

## 2024-02-19 DIAGNOSIS — R55 Syncope and collapse: Secondary | ICD-10-CM | POA: Diagnosis not present

## 2024-02-19 DIAGNOSIS — J3089 Other allergic rhinitis: Secondary | ICD-10-CM | POA: Diagnosis not present

## 2024-02-19 DIAGNOSIS — R002 Palpitations: Secondary | ICD-10-CM | POA: Diagnosis not present

## 2024-02-20 ENCOUNTER — Other Ambulatory Visit: Payer: Self-pay

## 2024-02-20 ENCOUNTER — Other Ambulatory Visit: Payer: Self-pay | Admitting: Pharmacy Technician

## 2024-02-20 ENCOUNTER — Other Ambulatory Visit (HOSPITAL_COMMUNITY): Payer: Self-pay

## 2024-02-20 NOTE — Progress Notes (Signed)
 [employer] called stating that patient had opted into the [facility] Payment Plan and that it would go into effect in 24 hours, also stated that patient wanted Botox  filled. Advised [employer] representative that patient would need to be onboarded and appointment would need to be confirmed as it is an office-administered medication. [employer] representative understood and will relay information to patient. Routing to [employer] for potential onboarding.

## 2024-02-20 NOTE — Progress Notes (Signed)
 Specialty Pharmacy Initial Fill Coordination Note  Virginia Luna is a 62 y.o. female contacted today regarding initial fill of specialty medication(s) OnabotulinumtoxinA  (BOTOX )   Patient requested Courier to Provider Office   Delivery date: 02/26/24   Verified address: [facility]  [address] [place] [address] [place], [place], [address]   Medication will be filled on 7.8.25.   Patient is aware of $0 copayment.

## 2024-02-20 NOTE — Progress Notes (Signed)
 This has been resolved

## 2024-02-23 DIAGNOSIS — R002 Palpitations: Secondary | ICD-10-CM

## 2024-02-23 DIAGNOSIS — R55 Syncope and collapse: Secondary | ICD-10-CM | POA: Diagnosis not present

## 2024-02-23 NOTE — Progress Notes (Unsigned)
 NEUROLOGY FOLLOW UP OFFICE NOTE  Virginia Luna [id number]  Assessment/Plan:   1.Vestibular migraine/chronic migraine without aura, without status migrainosus, not intractable 2. Cerebellar hemangioblastoma, grade 1 status post resection  3.  Cerebellar tremor, stable 4. Depression and anxiety  5  Transient episode of passing out - possibly vasovagal syncope 6  Black out spells - unclear etiology.  While in rehab, she had episode of shaking which appeared to be restlessness in bed possibly due to her apnea.  No loss of consciousness.  Semiology not likely to be seizure.   Migraine prevention:  Botox  every 3 months; zonisamide  to 300mg  daily.  In addition, will try to start Aimovig  140mg  every 28 days. Ubrelvy , naratriptan .  Zofran  for nausea. Limit use of pain relievers to no more than 2 days out of week to prevent risk of rebound or medication-overuse headache. Keep headache diary Advised following up with psychiatry and psychology. Follow up for Botox  Follow up one year for routine     Subjective:  Virginia Luna is a 63 year old right-handed female with cerebellar hemangioblastoma, OSA, diabetes, chronic back pain, and IBS who follows up for migraine.   UPDATE: Black out spells and falls: EEG on 10/21/2023 was normal  She has had other falls in which she briefly passes out.  She had a Zio patch which was unremarkable. She did not have a spell during the monitor.  She does report that sometimes her glucose drops.    Migraine: Brief with Ubrelvy  or naratriptan .  There is an increase in frequency over the past couple of months due to change in barometric pressure.  Overall improved compared to prior to Botox .     Current NSAIDS/analgesics:  Hydrocodone-acetaminophen  (pain), diclofenac 75mg  Current triptans:  naratriptan  2.5mg  Current ergotamine:  none Current anti-emetic:  Zofran  ODT 4mg  Current muscle relaxants:  Robaxin  Current Antihypertensive medications:  Metoprolol  succinate, amlodipine  Current Antidepressant medications:  none Current Anticonvulsant medications: gabapentin  300mg  BID, oxcarbazepine  900mg  at bedtime (for chronic pain), zonisamide  300mg  daily Current anti-CGRP:  Ubrelvy  100mg  Current Vitamins/Herbal/Supplements:  D, melatonin Current Antihistamines/Decongestants:  Meclizine  25mg  PRN, Benadryl  50mg  QHS, Flonase Other therapy:  Botox  Hormone/birth control:  none Other medications:  Ambien , Seroquel    Caffeine:  No coffee.  Occasional Coke Diet:  1 gallon water daily.  Tries not to skip meals.   Exercise:  Unable due to pain, nausea and dizziness Depression/Anxiety:  yes Other pain:  fibromyalgia - pain is severe everyday.   Sleep hygiene: Has OSA has not been using the CPAP   HISTORY:  She has had migraines for many years.  They were manageable, usually occurring once a month.  They started to become more frequent in 2020, progressing to the point that she now has a persistent daily headache.  They are typically bifrontal and pounding, associated with nausea, vomiting, vertigo, photophobia, phonophobia and blurred vision but no numbness or weakness.  Intensity will fluctuate from dull-moderate to severe about once a week for 2-3 days.  Sunlight, loud noise and quick movements are aggravating factors.  Resting in a dark and cool quiet room helps relieve pain.  She cannot really identify a specific trigger for her chronic daily headache but it may have followed a fall in which she tripped and hit her head.   She treats headache with sumatriptan  (2-3 days a week) and treats nausea with Zofran  and dizziness with meclizine .  She also takes hydrocodone for fibromyalgia.    MRI of brain with and  without contrast on 09/03/2020 showed enhancing 8 x 6 mm posterior fossa mass left of midline with edema within the superior cerebellum.  Follow up MRI on 09/24/2020 was stable.  She was referred to neurosurgery who favored to closely monitor.  Repeat MRI with  and without contrast on 10/30/2020 was stable, favored to be a cerebellar hemangioblastoma. Repeat MRI of brain with and without contrast on 09/03/2021 personally reviewed showed increase size of the left superior cerebellar vermis mass from 1.1 x 1.0 cm to 1.5 x 1.5 cm with mild increased surrounding edema.  Plan was to undergo surgery with Dr. Joshua.  However, she presented to [facility] in [place] with tremors and increased headaches.  She underwent craniotomy and tumor resection there with Dr. Curtistine Hora of neurosurgery.  Postoperative MRI showed small area of diffusion restriction in the cerebellum.  No complications.  Biopsy revealed grade 1  hemangioblastoma.  She was discharged to inpatient rehab where it was noted to have bilateral upper and lower extremity thrashing without loss of consciousness at night while in bed.  Evalauted by neurology who did not suspect seizure but rather secondary to her untreated sleep apnea. Started CPAP.  She finds Botox  to be helpful.  Headaches are no longer diffuse, just left sided although it can be painful.  Occurring 15 days a month.  She reports that she had an episode where she blacked out.  She was sitting watching TV while talking to a friend on the phone and the next thing she knew, something else was playing on the TV suggesting time elapsed.  She was still on the phone with her friend. Started having worsening headache in June 2023.  Seen in ED on 02/07/2022.  CTA head personally reviewed was unremarkable.  MRI of brain with and without contrast personally reviewed limited by motion artifact but showed no evidence of residual or recurrent mass in the resection within the left cerebellar hemisphere.  Repeat MRI of brain on 03/16/2023 revealed postsurgical changes in the cerebellum without evidence of recurrent tumor.  Done without contrast because couldn't get IV access.  She did have follow up MRI of brain with contrast on 04/16/2023 which confirmed no  recurrence of tumor.       Past NSAIDS/analgesics:  Ibuprofen, naproxen, Excedrin/BC/Goody Past abortive triptans:  sumatriptan   Past abortive ergotamine:  none Past muscle relaxants:  Flexeril Past anti-emetic:  Promethazine  25mg  Past antihypertensive medications:  none Past antidepressant medications:  Amitriptyline or nortriptyline, venlafaxine Past anticonvulsant medications:  topiramate, gabapentin  Past anti-CGRP:  Emgality  Past vitamins/Herbal/Supplements:  none Past antihistamines/decongestants:  none Other past therapies:  vestibular rehab     Family history of headache:  unknown  PAST MEDICAL HISTORY: Past Medical History:  Diagnosis Date   Allergies    Anemia    Arthritis    Asthma    Back pain    Chronic pain    Diabetes (HCC)    Diabetes mellitus without complication (HCC)    Edema, lower extremity    Fibroid    Fibromyalgia    Chronic   GERD (gastroesophageal reflux disease)    Headache    migraines   Hepatic steatosis    High blood pressure    History of hiatal hernia    History of kidney stones    History of stomach ulcers    IBS (irritable bowel syndrome)    Joint pain    Sleep apnea    did use cpap-lost 25lb-says she does not need it  NO CPAP   Wears contact lenses     MEDICATIONS: Current Outpatient Medications on File Prior to Visit  Medication Sig Dispense Refill   acetaminophen  (TYLENOL ) 325 MG tablet Take 1-2 tablets (325-650 mg total) by mouth every 4 (four) hours as needed for mild pain.     albuterol  (PROVENTIL ) (2.5 MG/3ML) 0.083% nebulizer solution Take 2.5 mg by nebulization every 6 (six) hours as needed (Asthma).     albuterol  (VENTOLIN  HFA) 108 (90 Base) MCG/ACT inhaler Inhale 2 puffs into the lungs every 4 (four) hours as needed.     amLODipine  (NORVASC ) 10 MG tablet Take 10 mg by mouth in the morning.     atorvastatin  (LIPITOR) 10 MG tablet Take 1 tablet (10 mg total) by mouth at bedtime. 30 tablet 0   azelastine  (ASTELIN ) 0.1  % nasal spray Place 1 spray into both nostrils 2 (two) times daily as needed for rhinitis. 30 mL 0   B-D 3CC LUER-LOK SYR 25GX1 25G X 1 3 ML MISC SMARTSIG:1 IM Every 2 Weeks     botulinum toxin Type A  (BOTOX ) 200 units injection Inject 155 units IM into multiple site in the face,neck and head once every 90 days 1 each 4   COMIRNATY syringe      cyanocobalamin  (VITAMIN B12) 1000 MCG/ML injection Inject 1,000 mcg into the muscle every 14 (fourteen) days.     desloratadine (CLARINEX) 5 MG tablet Take 5 mg by mouth daily.     diclofenac (VOLTAREN) 75 MG EC tablet Take 75 mg by mouth 2 (two) times daily.     eletriptan  (RELPAX ) 40 MG tablet Take 1 tablet (40 mg total) by mouth once for 1 dose. May repeat in 2 hours if headache persists or recurs. (Patient not taking: Reported on 09/11/2023) 10 tablet 2   EPINEPHrine  0.3 mg/0.3 mL IJ SOAJ injection Inject 0.3 mg into the muscle as needed for anaphylaxis.     fluticasone (FLONASE) 50 MCG/ACT nasal spray Place 1 spray into both nostrils daily as needed for rhinitis or allergies.     fluticasone-salmeterol (ADVAIR) 250-50 MCG/ACT AEPB Inhale 1 puff into the lungs 2 (two) times daily.     furosemide (LASIX) 20 MG tablet Take 20 mg by mouth daily as needed for fluid.     gabapentin  (NEURONTIN ) 300 MG capsule Take 300 mg by mouth 2 (two) times daily.     levocetirizine (XYZAL ) 5 MG tablet Take 1 tablet (5 mg total) by mouth every evening. 30 tablet 0   lidocaine -prilocaine  (EMLA ) cream Apply 1 Application topically as needed. 30 g 0   linaclotide  (LINZESS ) 290 MCG CAPS capsule Take 290 mcg by mouth every morning.     meclizine  (ANTIVERT ) 25 MG tablet Take 1 tablet (25 mg total) by mouth 3 (three) times daily as needed for dizziness. (Patient taking differently: Take 25 mg by mouth 2 (two) times daily.) 60 tablet 0   melatonin 5 MG TABS Take 1 tablet (5 mg total) by mouth at bedtime. 30 tablet 0   methocarbamol  (ROBAXIN ) 500 MG tablet Take 750 mg by mouth 2  (two) times daily.     montelukast  (SINGULAIR ) 10 MG tablet Take 1 tablet (10 mg total) by mouth at bedtime. 30 tablet 0   naratriptan  (AMERGE) 2.5 MG tablet Take 1 tablet (2.5 mg total) by mouth as needed for migraine. Take one (1) tablet at onset of headache; if returns or does not resolve, may repeat after 4 hours; do not exceed five (5) mg  in 24 hours. 10 tablet 5   NON FORMULARY Pt uses a cpap nightly     ondansetron  (ZOFRAN ) 4 MG tablet Take 1 tablet (4 mg total) by mouth every 8 (eight) hours as needed for nausea or vomiting. 60 tablet 5   ondansetron  (ZOFRAN -ODT) 4 MG disintegrating tablet TAKE ONE TABLET BY MOUTH every EIGHT hours AS NEEDED FOR NAUSEA/VOMITING 90 tablet 2   ondansetron  (ZOFRAN -ODT) 8 MG disintegrating tablet Take 8 mg by mouth every 8 (eight) hours as needed.     Oxcarbazepine  (TRILEPTAL ) 300 MG tablet Take 3 tablets (900 mg total) by mouth at bedtime. 270 tablet 1   oxybutynin  (DITROPAN ) 5 MG tablet Take 1 tablet (5 mg total) by mouth every 8 (eight) hours as needed for bladder spasms. (Patient taking differently: Take 5 mg by mouth at bedtime.) 30 tablet 0   pantoprazole  (PROTONIX ) 40 MG tablet Take 1 tablet (40 mg total) by mouth daily. (Patient taking differently: Take 40 mg by mouth in the morning and at bedtime.) 30 tablet 0   polyethylene glycol (MIRALAX  / GLYCOLAX ) 17 g packet Take 17 g by mouth daily as needed for mild constipation. 14 each 0   QUEtiapine  (SEROQUEL ) 300 MG tablet TAKE 1 TABLET AT BEDTIME 90 tablet 3   Semaglutide ,0.25 or 0.5MG /DOS, (OZEMPIC , 0.25 OR 0.5 MG/DOSE,) 2 MG/1.5ML SOPN Inject 0.5 mg into the skin once a week. (Patient taking differently: Inject 0.5 mg into the skin every Sunday.) 1.5 mL 0   senna-docusate (SENOKOT-S) 8.6-50 MG tablet Take 2 tablets by mouth at bedtime. 60 tablet 0   sodium chloride  (OCEAN) 0.65 % SOLN nasal spray Place 2 sprays into both nostrils in the morning, at noon, in the evening, and at bedtime. 30 mL 2   tamsulosin   (FLOMAX ) 0.4 MG CAPS capsule Take 1 capsule (0.4 mg total) by mouth at bedtime. 30 capsule 0   Ubrogepant  (UBRELVY ) 100 MG TABS Take 1 tablet (100 mg total) by mouth as needed (take 1 tab at the earlist onset of a migraine. May repeat in 2 hours. Max 2 tabs in 24 hours). 16 tablet 5   Vitamin D , Ergocalciferol , (DRISDOL ) 1.25 MG (50000 UNIT) CAPS capsule TAKE ONE CAPSULE BY MOUTH EVERY 7 DAYS (Patient taking differently: Take 50,000 Units by mouth every Sunday.) 4 capsule 0   zolpidem  (AMBIEN ) 10 MG tablet Take 1 tablet (10 mg total) by mouth at bedtime. 30 tablet 5   zonisamide  (ZONEGRAN ) 100 MG capsule Take 3 capsules (300 mg total) by mouth daily. 90 capsule 5   Current Facility-Administered Medications on File Prior to Visit  Medication Dose Route Frequency Provider Last Rate Last Admin   botulinum toxin Type A  (BOTOX ) injection 200 Units  200 Units Intramuscular Once Rania Prothero R, DO       lidocaine  (XYLOCAINE ) 1 % (with pres) injection 6 mL  6 mL Other Once        lidocaine  (XYLOCAINE ) 1 % (with pres) injection 6 mL  6 mL Other Once        lidocaine  (XYLOCAINE ) 1 % (with pres) injection 6 mL  6 mL Other Once         ALLERGIES: Allergies  Allergen Reactions   Rocephin [Ceftriaxone] Anaphylaxis   Morphine And Codeine Itching and Nausea And Vomiting    Doesn't work   Prednisone Itching and Swelling   Sulfa Antibiotics Itching and Swelling   Amitriptyline Other (See Comments)   Penicillin G    Penicillin G Sodium Other (  See Comments)    FAMILY HISTORY: Family History  Problem Relation Age of Onset   Obesity Mother    Diabetes Father    High blood pressure Father    Sudden death Father    Breast cancer Paternal Grandmother    Breast cancer Paternal Aunt       Objective:  Blood pressure 131/86, pulse (!) 101, height 5' 1 (1.549 m), weight 238 lb 6.4 oz (108.1 kg), SpO2 93%. General: No acute distress.  Patient appears well-groomed.   Head:  Normocephalic/atraumatic Neck:   Supple.  No paraspinal tenderness.  Full range of motion. Heart:  Regular rate and rhythm. Neuro:  Alert and oriented.  Speech fluent and not dysarthric.  Language intact.  CN II-XII intact.  Bulk and tone normal.  Muscle strength 5/5 throughout.  Sensation to light touch intact.  Deep tendon reflexes 2+ throughout, toes downgoing.  Antalgic gait.  Romberg negative.    Juliene Dunnings, DO  CC: Geni Shutter, DO

## 2024-02-24 ENCOUNTER — Encounter: Payer: Self-pay | Admitting: Neurology

## 2024-02-24 ENCOUNTER — Other Ambulatory Visit (HOSPITAL_COMMUNITY): Payer: Self-pay

## 2024-02-24 ENCOUNTER — Ambulatory Visit: Payer: Medicare HMO | Admitting: Neurology

## 2024-02-24 VITALS — BP 131/86 | HR 101 | Ht 61.0 in | Wt 238.4 lb

## 2024-02-24 DIAGNOSIS — G43709 Chronic migraine without aura, not intractable, without status migrainosus: Secondary | ICD-10-CM

## 2024-02-24 DIAGNOSIS — R55 Syncope and collapse: Secondary | ICD-10-CM | POA: Diagnosis not present

## 2024-02-24 DIAGNOSIS — G4733 Obstructive sleep apnea (adult) (pediatric): Secondary | ICD-10-CM

## 2024-02-24 DIAGNOSIS — D432 Neoplasm of uncertain behavior of brain, unspecified: Secondary | ICD-10-CM

## 2024-02-24 DIAGNOSIS — D496 Neoplasm of unspecified behavior of brain: Secondary | ICD-10-CM

## 2024-02-24 MED ORDER — NARATRIPTAN HCL 2.5 MG PO TABS
2.5000 mg | ORAL_TABLET | ORAL | 5 refills | Status: DC | PRN
  Filled 2024-02-24: qty 9, 21d supply, fill #0

## 2024-02-24 MED ORDER — AIMOVIG 140 MG/ML ~~LOC~~ SOAJ
140.0000 mg | SUBCUTANEOUS | 11 refills | Status: DC
  Filled 2024-02-24: qty 1, 28d supply, fill #0

## 2024-02-24 NOTE — Patient Instructions (Signed)
 Try to start Aimovig  injection every 28 days Continue Botox  every 3 months and zonisamide  300mg  at bedtime Ubrelvy  or naratriptan  for migraine attacks

## 2024-02-25 ENCOUNTER — Other Ambulatory Visit: Payer: Self-pay

## 2024-02-25 ENCOUNTER — Telehealth: Payer: Self-pay

## 2024-02-25 ENCOUNTER — Other Ambulatory Visit: Payer: Self-pay | Admitting: Neurology

## 2024-02-25 ENCOUNTER — Other Ambulatory Visit (HOSPITAL_COMMUNITY): Payer: Self-pay

## 2024-02-25 DIAGNOSIS — E1159 Type 2 diabetes mellitus with other circulatory complications: Secondary | ICD-10-CM | POA: Diagnosis not present

## 2024-02-25 DIAGNOSIS — K5909 Other constipation: Secondary | ICD-10-CM | POA: Diagnosis not present

## 2024-02-25 DIAGNOSIS — E66813 Obesity, class 3: Secondary | ICD-10-CM | POA: Diagnosis not present

## 2024-02-25 DIAGNOSIS — E1169 Type 2 diabetes mellitus with other specified complication: Secondary | ICD-10-CM | POA: Diagnosis not present

## 2024-02-25 DIAGNOSIS — Z6841 Body Mass Index (BMI) 40.0 and over, adult: Secondary | ICD-10-CM | POA: Diagnosis not present

## 2024-02-25 MED ORDER — EMGALITY 120 MG/ML ~~LOC~~ SOAJ
240.0000 mg | Freq: Once | SUBCUTANEOUS | 0 refills | Status: AC
  Filled 2024-02-25 – 2024-02-27 (×3): qty 2, 30d supply, fill #0

## 2024-02-25 MED ORDER — EMGALITY 120 MG/ML ~~LOC~~ SOAJ
120.0000 mg | SUBCUTANEOUS | 11 refills | Status: DC
  Filled 2024-02-25: qty 1, 30d supply, fill #0

## 2024-02-25 NOTE — Telephone Encounter (Signed)
 Per Leita from [facility], Aimovig  is not a covered medication on the patient plan.  Wonder if they can changed the mediation to Emgality .

## 2024-02-25 NOTE — Telephone Encounter (Signed)
 Per Dr.Jaffe, Sent prescription for Emgality  to [facility] - let patient know that first dose, should receive 2 pens for 2 injections, then 1 injection every 28 days thereafter    Patient advised via mychart and LMOVM.

## 2024-02-26 ENCOUNTER — Telehealth (HOSPITAL_COMMUNITY): Payer: Self-pay | Admitting: Pharmacy Technician

## 2024-02-26 ENCOUNTER — Other Ambulatory Visit (HOSPITAL_COMMUNITY): Payer: Self-pay

## 2024-02-26 ENCOUNTER — Telehealth (HOSPITAL_COMMUNITY): Payer: Self-pay

## 2024-02-26 NOTE — Telephone Encounter (Signed)
//

## 2024-02-26 NOTE — Telephone Encounter (Signed)
 Pharmacy Patient Advocate Encounter   Received notification from Pt Calls Messages that prior authorization for Emgality  120MG /ML auto-injectors  is required/requested.   Insurance verification completed.   The patient is insured through [employer] .   Per test claim: PA required; PA submitted to above mentioned insurance via CoverMyMeds Key/confirmation #/EOC [id number] Status is pending

## 2024-02-27 ENCOUNTER — Other Ambulatory Visit (HOSPITAL_COMMUNITY): Payer: Self-pay

## 2024-02-27 NOTE — Telephone Encounter (Signed)
 Pharmacy Patient Advocate Encounter  Received notification from [employer] that Prior Authorization for Emgality  120MG /ML auto-injectors has been APPROVED from 08/21/23 to 08/19/24. Ran test claim, Copay is $0. This test claim was processed through [facility]- copay amounts may vary at other pharmacies due to pharmacy/plan contracts, or as the patient moves through the different stages of their insurance plan.   PA #/Case ID/Reference #: [id number]

## 2024-02-29 ENCOUNTER — Other Ambulatory Visit (HOSPITAL_COMMUNITY): Payer: Self-pay

## 2024-03-03 ENCOUNTER — Other Ambulatory Visit: Payer: Self-pay

## 2024-03-03 DIAGNOSIS — J301 Allergic rhinitis due to pollen: Secondary | ICD-10-CM | POA: Diagnosis not present

## 2024-03-03 DIAGNOSIS — J3089 Other allergic rhinitis: Secondary | ICD-10-CM | POA: Diagnosis not present

## 2024-03-03 DIAGNOSIS — J3081 Allergic rhinitis due to animal (cat) (dog) hair and dander: Secondary | ICD-10-CM | POA: Diagnosis not present

## 2024-03-05 DIAGNOSIS — E119 Type 2 diabetes mellitus without complications: Secondary | ICD-10-CM | POA: Diagnosis not present

## 2024-03-05 DIAGNOSIS — I10 Essential (primary) hypertension: Secondary | ICD-10-CM | POA: Diagnosis not present

## 2024-03-05 DIAGNOSIS — E1169 Type 2 diabetes mellitus with other specified complication: Secondary | ICD-10-CM | POA: Diagnosis not present

## 2024-03-06 ENCOUNTER — Ambulatory Visit: Admitting: Neurology

## 2024-03-06 DIAGNOSIS — G43709 Chronic migraine without aura, not intractable, without status migrainosus: Secondary | ICD-10-CM | POA: Diagnosis not present

## 2024-03-06 MED ORDER — ONABOTULINUMTOXINA 100 UNITS IJ SOLR
200.0000 [IU] | Freq: Once | INTRAMUSCULAR | Status: AC
  Administered 2024-03-06: 155 [IU] via INTRAMUSCULAR

## 2024-03-09 ENCOUNTER — Encounter: Admitting: Physical Medicine and Rehabilitation

## 2024-03-10 DIAGNOSIS — E1159 Type 2 diabetes mellitus with other circulatory complications: Secondary | ICD-10-CM | POA: Diagnosis not present

## 2024-03-10 DIAGNOSIS — R350 Frequency of micturition: Secondary | ICD-10-CM | POA: Diagnosis not present

## 2024-03-10 DIAGNOSIS — M797 Fibromyalgia: Secondary | ICD-10-CM | POA: Diagnosis not present

## 2024-03-10 DIAGNOSIS — E7800 Pure hypercholesterolemia, unspecified: Secondary | ICD-10-CM | POA: Diagnosis not present

## 2024-03-10 DIAGNOSIS — E538 Deficiency of other specified B group vitamins: Secondary | ICD-10-CM | POA: Diagnosis not present

## 2024-03-10 DIAGNOSIS — M170 Bilateral primary osteoarthritis of knee: Secondary | ICD-10-CM | POA: Diagnosis not present

## 2024-03-10 DIAGNOSIS — E119 Type 2 diabetes mellitus without complications: Secondary | ICD-10-CM | POA: Diagnosis not present

## 2024-03-10 DIAGNOSIS — Z0000 Encounter for general adult medical examination without abnormal findings: Secondary | ICD-10-CM | POA: Diagnosis not present

## 2024-03-10 DIAGNOSIS — G894 Chronic pain syndrome: Secondary | ICD-10-CM | POA: Diagnosis not present

## 2024-03-10 DIAGNOSIS — G43909 Migraine, unspecified, not intractable, without status migrainosus: Secondary | ICD-10-CM | POA: Diagnosis not present

## 2024-03-12 DIAGNOSIS — J3081 Allergic rhinitis due to animal (cat) (dog) hair and dander: Secondary | ICD-10-CM | POA: Diagnosis not present

## 2024-03-12 DIAGNOSIS — J301 Allergic rhinitis due to pollen: Secondary | ICD-10-CM | POA: Diagnosis not present

## 2024-03-12 DIAGNOSIS — J3089 Other allergic rhinitis: Secondary | ICD-10-CM | POA: Diagnosis not present

## 2024-03-13 ENCOUNTER — Encounter: Payer: Self-pay | Admitting: Physical Medicine and Rehabilitation

## 2024-03-13 ENCOUNTER — Encounter: Attending: Physical Medicine and Rehabilitation | Admitting: Physical Medicine and Rehabilitation

## 2024-03-13 VITALS — BP 128/88 | HR 86 | Ht 61.0 in | Wt 240.0 lb

## 2024-03-13 DIAGNOSIS — M7918 Myalgia, other site: Secondary | ICD-10-CM | POA: Diagnosis not present

## 2024-03-13 DIAGNOSIS — R2689 Other abnormalities of gait and mobility: Secondary | ICD-10-CM | POA: Diagnosis not present

## 2024-03-13 DIAGNOSIS — M797 Fibromyalgia: Secondary | ICD-10-CM

## 2024-03-13 DIAGNOSIS — F4323 Adjustment disorder with mixed anxiety and depressed mood: Secondary | ICD-10-CM | POA: Diagnosis not present

## 2024-03-13 DIAGNOSIS — D496 Neoplasm of unspecified behavior of brain: Secondary | ICD-10-CM

## 2024-03-13 DIAGNOSIS — E1169 Type 2 diabetes mellitus with other specified complication: Secondary | ICD-10-CM | POA: Diagnosis not present

## 2024-03-13 DIAGNOSIS — I10 Essential (primary) hypertension: Secondary | ICD-10-CM | POA: Diagnosis not present

## 2024-03-13 DIAGNOSIS — E119 Type 2 diabetes mellitus without complications: Secondary | ICD-10-CM | POA: Diagnosis not present

## 2024-03-13 MED ORDER — LIDOCAINE HCL 1 % IJ SOLN
9.0000 mL | Freq: Once | INTRAMUSCULAR | Status: AC
  Administered 2024-03-13: 9 mL

## 2024-03-13 NOTE — Progress Notes (Signed)
 Pt is a 63 yr old female with hx of fibromyalgia- dx'd at age 56, DM2,- Ac1 6.2;  asthma; HTN, OSA, BMI is 50; Nodule in L superior cerebellum. Also has migraines- intractable and daily; has trace aura;  No kidney issues- Cr 0.65 and BUN 15 in 6/22. Also end stage OA of B/L knees medially and R hip pain. Also has myofascial pain syndrome.  Has new brain mass that was documented Worsening cerebellar Sx's. So had crani for cerebellar tumor removal late March 2023-  Here for f/u on Fibromyalgia and Myofascial pain syndrome and TrP injections      Clemens getting here-  daycare door- and fell on knees- a little bloody-  Falling still- 4x since beginning of January- that she's reported.  Not sure how many other times- 4x that she can remember.   When falls, falls due to dizziness and rushing.  Really needs trp injections- esp because sciatica is out the roof.   Last time asked to check her BP when standing-  One morning her BP was 200 systolic- and did it again, was fine.   Did a heart monitor- 2 weeks- was fine. And has already done CBG monitoring- was all right- a little low, but not bad.   Going to [facility] on 7/29 for knee and hip pain.     Exam: BP 128/88- awake, alert, appropriate, NAD Using Rolator to walk L knee skinned up  Somewhat discombulated  Plan: Needs to check Blood pressure WHEN STANDING- 5 minutes after standing up- don't do immediately- if it's low, do it more regularly. Goes directly to [facility] something- gets into computer. Due to falls-   2. Patient here for trigger point injections for  Consent done and on chart.  Cleaned areas with alcohol and injected using a 27 gauge 1.5 inch needle  Injected  8.5cc- wasted 1/2cc Using 1% Lidocaine  with no EPI  Upper traps B/L  Levators- B/L  Posterior scalenes Middle scalenes- B/L  Splenius Capitus- B/L  Pectoralis Major- b/L  Rhomboids- B/L x2 Infraspinatus Teres Major/minor Thoracic paraspinals- B/L  Lumbar  paraspinals- b/L  Other injections- B/L hamstrings x2, B/L vastus medialis, B/L B/L hands and B/L triceps   There was no bleeding or complications.  Patient was advised to drink a lot of water on day after injections to flush system Will have increased soreness for 12-48 hours after injections.  Can use Lidocaine  patches the day AFTER injections Can use theracane on day of injections in places didn't inject Can use heating pad 4-6 hours AFTER injections  3. F/U in 6 weeks- con't regimen of meds- has refills-

## 2024-03-13 NOTE — Patient Instructions (Signed)
 Plan: Needs to check Blood pressure WHEN STANDING- 5 minutes after standing up- don't do immediately- if it's low, do it more regularly. Goes directly to [facility]- gets into computer.   2. Patient here for trigger point injections for  Consent done and on chart.  Cleaned areas with alcohol and injected using a 27 gauge 1.5 inch needle  Injected  8.5cc- wasted 1/2cc Using 1% Lidocaine  with no EPI  Upper traps B/L  Levators- B/L  Posterior scalenes Middle scalenes- B/L  Splenius Capitus- B/L  Pectoralis Major- b/L  Rhomboids- B/L x2 Infraspinatus Teres Major/minor Thoracic paraspinals- B/L  Lumbar paraspinals- b/L  Other injections- B/L hamstrings x2, B/L vastus medialis, B/L B/L hands and B/L triceps   There was no bleeding or complications.  Patient was advised to drink a lot of water on day after injections to flush system Will have increased soreness for 12-48 hours after injections.  Can use Lidocaine  patches the day AFTER injections Can use theracane on day of injections in places didn't inject Can use heating pad 4-6 hours AFTER injections  3. F/U in 6 weeks- con't regimen of meds- has refills-

## 2024-03-14 DIAGNOSIS — I469 Cardiac arrest, cause unspecified: Secondary | ICD-10-CM | POA: Diagnosis not present

## 2024-03-17 ENCOUNTER — Ambulatory Visit: Payer: Medicare HMO | Admitting: Radiology

## 2024-03-19 ENCOUNTER — Ambulatory Visit: Admitting: Orthopaedic Surgery

## 2024-03-20 DEATH — deceased

## 2024-04-09 ENCOUNTER — Other Ambulatory Visit: Payer: Self-pay

## 2024-04-09 NOTE — Progress Notes (Signed)
 Patient deceased 03-22-24, disenrolled.

## 2024-04-19 IMAGING — MG MM DIGITAL SCREENING BILAT W/ TOMO AND CAD
6 of 12 series · 6 of 36 positions shown · non-contrast
Comparison: Previous exam(s).

CLINICAL DATA: Screening.

EXAM:
DIGITAL SCREENING BILATERAL MAMMOGRAM WITH TOMOSYNTHESIS AND CAD
TECHNIQUE: Bilateral screening digital craniocaudal and mediolateral oblique
mammograms were obtained. Bilateral screening digital breast
tomosynthesis was performed. The images were evaluated with
computer-aided detection.

[L MLO synth-2D]
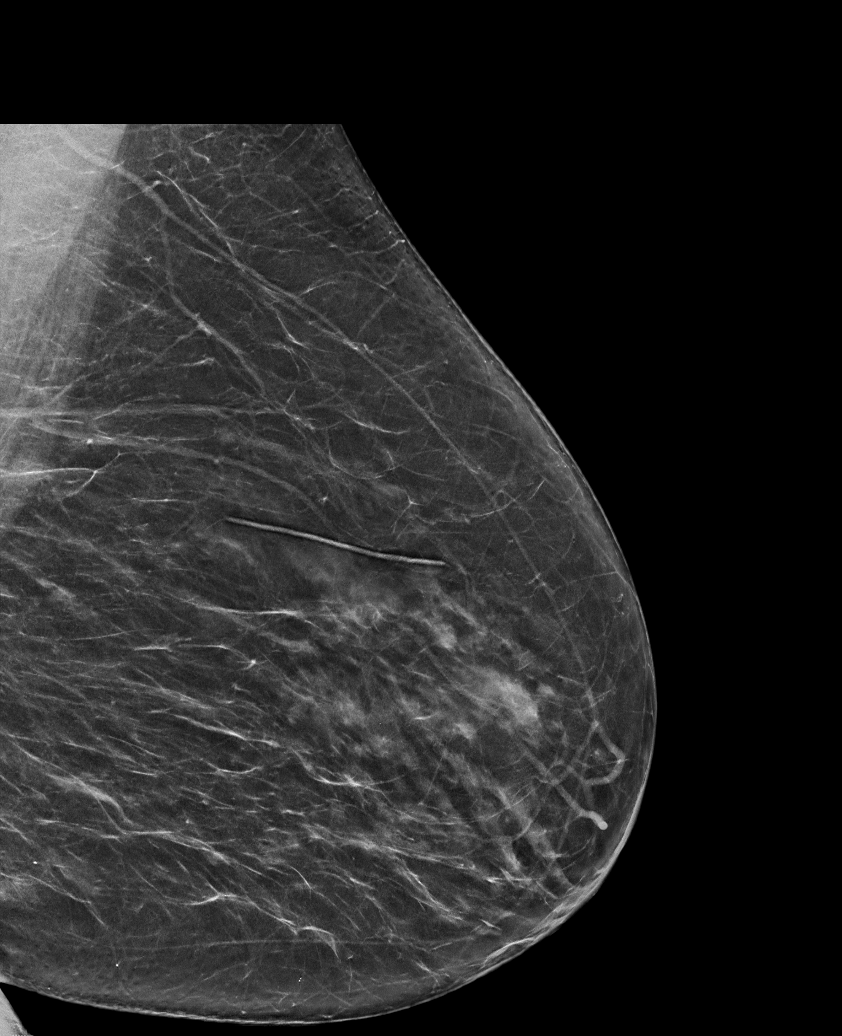

[R CC synth-2D (1 of 2)]
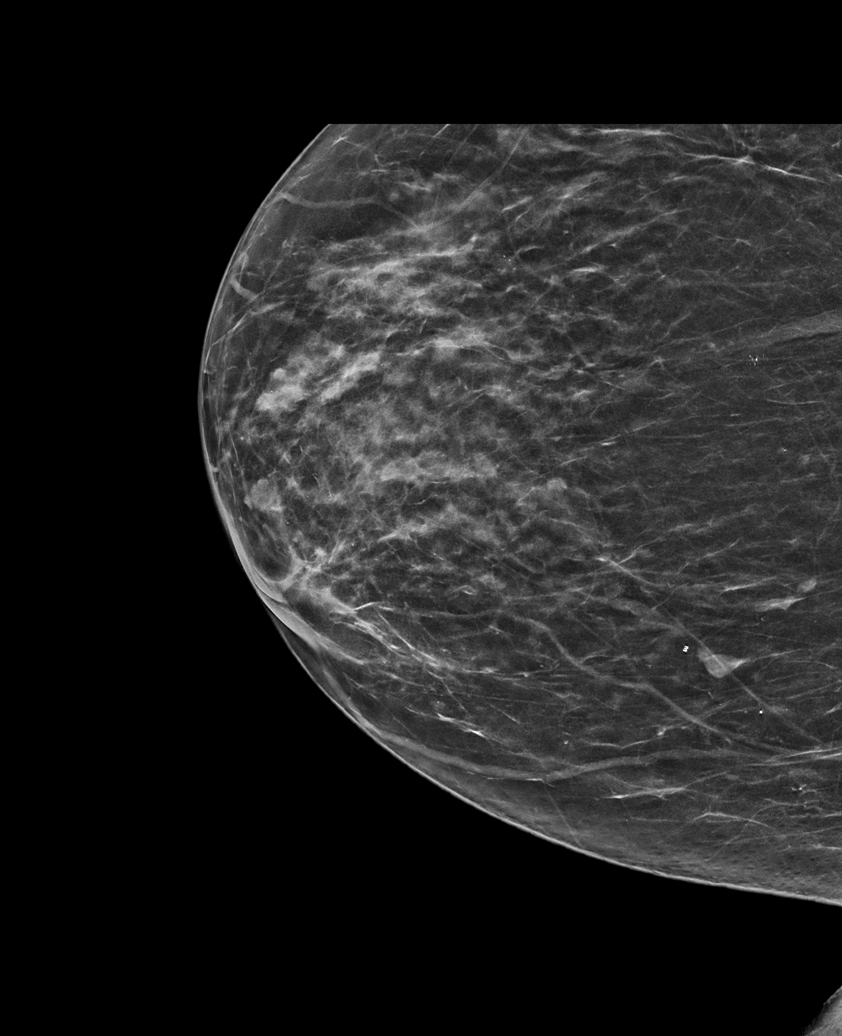

[L CC synth-2D (1 of 2)]
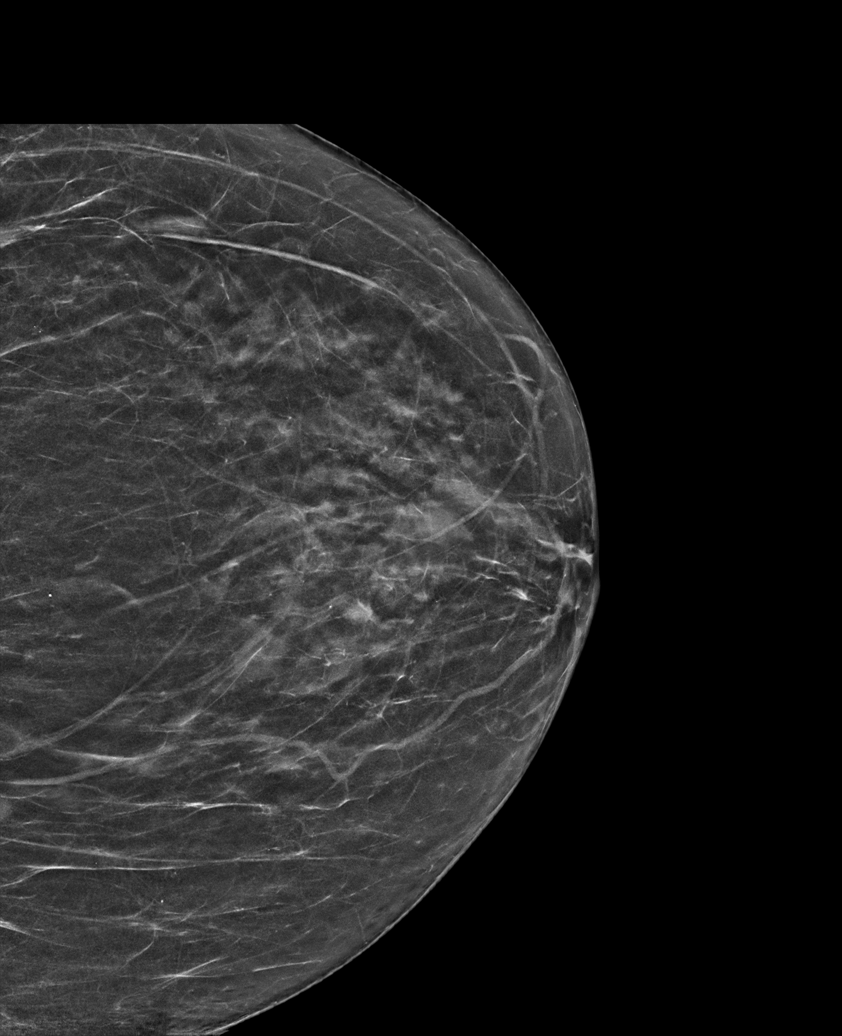

[R MLO synth-2D]
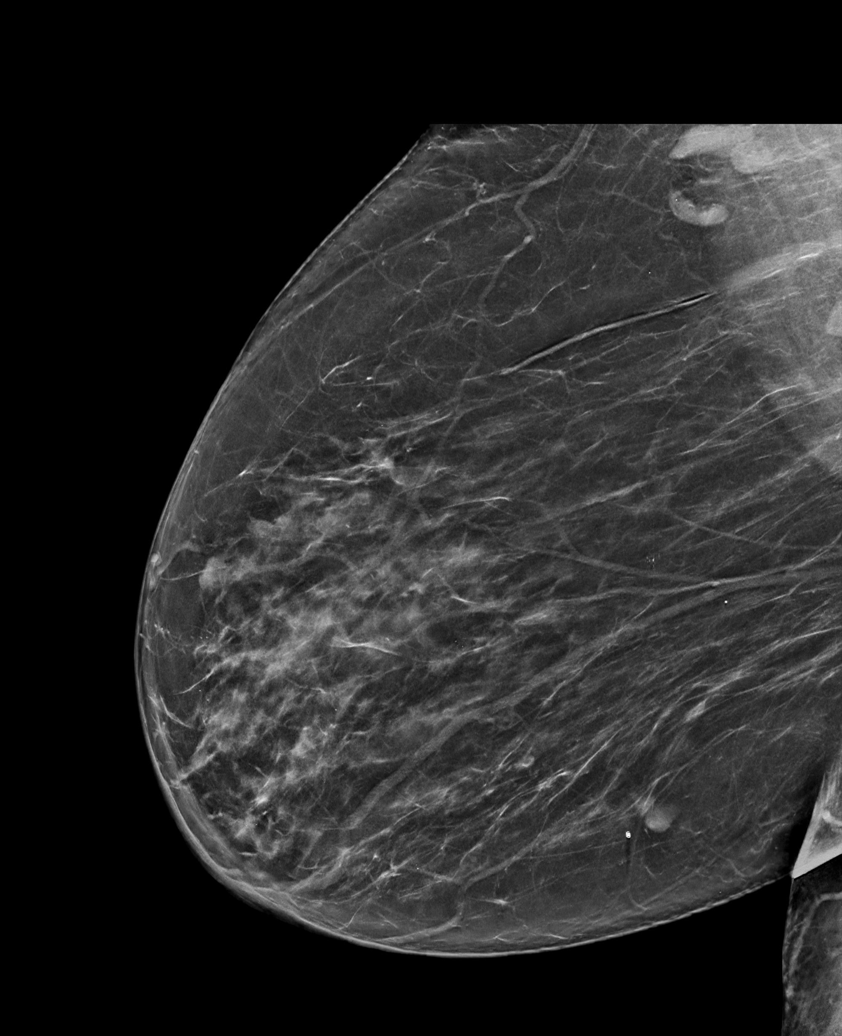

[L CC synth-2D (2 of 2)]
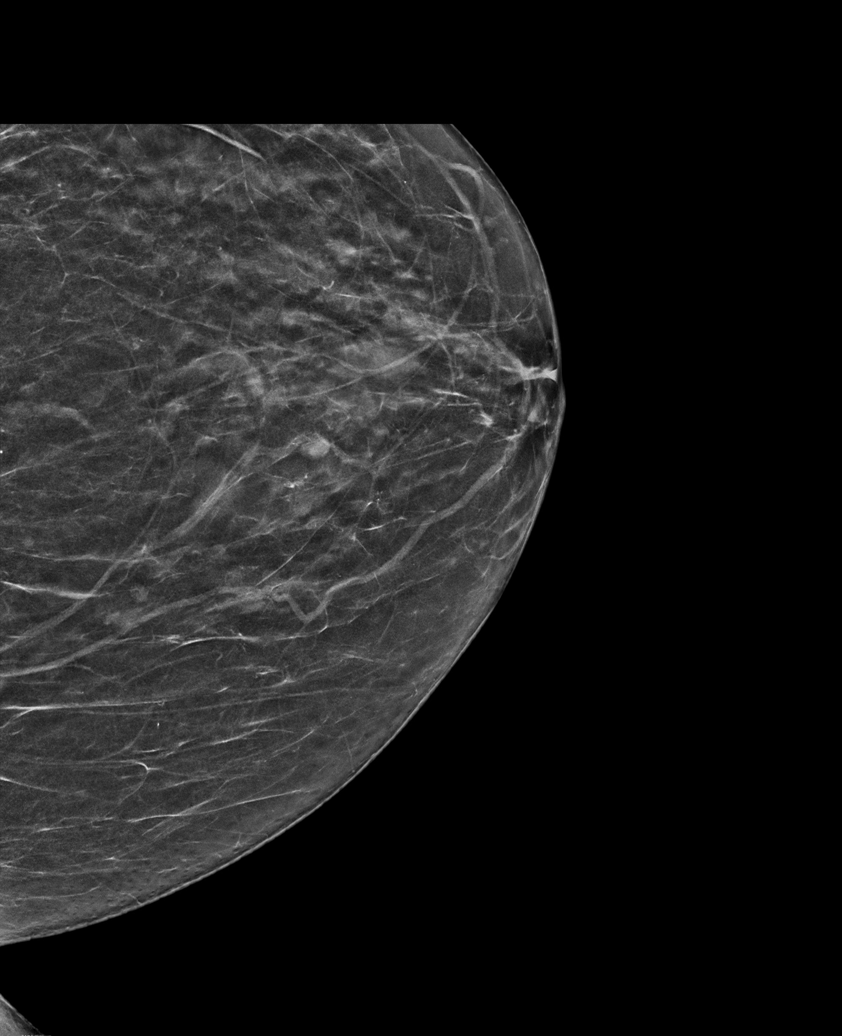

[R CC synth-2D (2 of 2)]
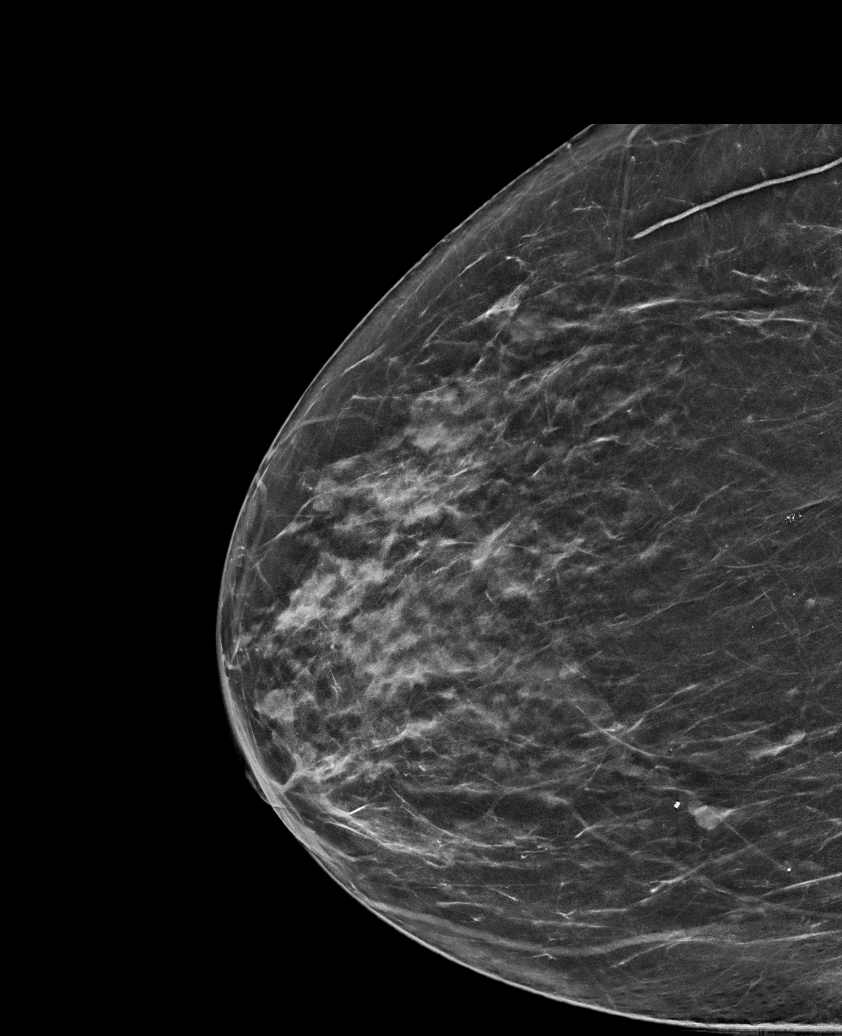

[6 of 36 positions shown; findings below may reference images not displayed]

ACR Breast Density Category b: There are scattered areas of
fibroglandular density.
FINDINGS: There are no findings suspicious for malignancy.
IMPRESSION: No mammographic evidence of malignancy. A result letter of this
screening mammogram will be mailed directly to the patient.

RECOMMENDATION:
Screening mammogram in one year. (Code:51-O-LD2)

BI-RADS CATEGORY  1: Negative.

## 2024-04-21 ENCOUNTER — Ambulatory Visit: Payer: Self-pay | Admitting: Family Medicine

## 2024-04-27 ENCOUNTER — Ambulatory Visit: Admitting: Physical Medicine and Rehabilitation

## 2024-05-08 ENCOUNTER — Ambulatory Visit: Admitting: Neurology

## 2024-06-05 ENCOUNTER — Ambulatory Visit: Admitting: Neurology

## 2024-06-08 ENCOUNTER — Ambulatory Visit: Admitting: Physical Medicine and Rehabilitation

## 2024-07-24 ENCOUNTER — Other Ambulatory Visit (HOSPITAL_COMMUNITY): Payer: Self-pay

## 2024-07-24 ENCOUNTER — Ambulatory Visit: Admitting: Physical Medicine and Rehabilitation

## 2024-07-27 ENCOUNTER — Other Ambulatory Visit (HOSPITAL_COMMUNITY): Payer: Self-pay

## 2024-09-04 ENCOUNTER — Ambulatory Visit: Admitting: Physical Medicine and Rehabilitation

## 2024-09-07 ENCOUNTER — Ambulatory Visit: Payer: Medicare HMO | Admitting: Psychiatry

## 2025-02-23 ENCOUNTER — Ambulatory Visit: Admitting: Neurology
